# Patient Record
Sex: Male | Born: 1940
Health system: Southern US, Community
[De-identification: ages and names within clinical notes are randomized; demographics above are authoritative.]

## PROBLEM LIST (undated history)

## (undated) DIAGNOSIS — E7800 Pure hypercholesterolemia, unspecified: Secondary | ICD-10-CM

## (undated) DIAGNOSIS — I10 Essential (primary) hypertension: Secondary | ICD-10-CM

## (undated) DIAGNOSIS — K219 Gastro-esophageal reflux disease without esophagitis: Secondary | ICD-10-CM

## (undated) DIAGNOSIS — J449 Chronic obstructive pulmonary disease, unspecified: Secondary | ICD-10-CM

## (undated) DIAGNOSIS — C3490 Malignant neoplasm of unspecified part of unspecified bronchus or lung: Secondary | ICD-10-CM

## (undated) DIAGNOSIS — M1990 Unspecified osteoarthritis, unspecified site: Secondary | ICD-10-CM

## (undated) DIAGNOSIS — K5520 Angiodysplasia of colon without hemorrhage: Secondary | ICD-10-CM

## (undated) DIAGNOSIS — E785 Hyperlipidemia, unspecified: Secondary | ICD-10-CM

## (undated) DIAGNOSIS — R251 Tremor, unspecified: Secondary | ICD-10-CM

## (undated) DIAGNOSIS — H269 Unspecified cataract: Secondary | ICD-10-CM

## (undated) DIAGNOSIS — S32000A Wedge compression fracture of unspecified lumbar vertebra, initial encounter for closed fracture: Secondary | ICD-10-CM

## (undated) DIAGNOSIS — J329 Chronic sinusitis, unspecified: Secondary | ICD-10-CM

## (undated) DIAGNOSIS — K5790 Diverticulosis of intestine, part unspecified, without perforation or abscess without bleeding: Secondary | ICD-10-CM

## (undated) DIAGNOSIS — I739 Peripheral vascular disease, unspecified: Secondary | ICD-10-CM

## (undated) DIAGNOSIS — R569 Unspecified convulsions: Secondary | ICD-10-CM

## (undated) DIAGNOSIS — B392 Pulmonary histoplasmosis capsulati, unspecified: Secondary | ICD-10-CM

## (undated) DIAGNOSIS — K635 Polyp of colon: Secondary | ICD-10-CM

## (undated) DIAGNOSIS — Z720 Tobacco use: Secondary | ICD-10-CM

## (undated) DIAGNOSIS — B399 Histoplasmosis, unspecified: Secondary | ICD-10-CM

## (undated) DIAGNOSIS — D689 Coagulation defect, unspecified: Secondary | ICD-10-CM

## (undated) DIAGNOSIS — S0191XA Laceration without foreign body of unspecified part of head, initial encounter: Secondary | ICD-10-CM

## (undated) DIAGNOSIS — L723 Sebaceous cyst: Secondary | ICD-10-CM

## (undated) DIAGNOSIS — D126 Benign neoplasm of colon, unspecified: Secondary | ICD-10-CM

## (undated) DIAGNOSIS — F32A Depression, unspecified: Secondary | ICD-10-CM

## (undated) DIAGNOSIS — Z86010 Personal history of colonic polyps: Secondary | ICD-10-CM

## (undated) DIAGNOSIS — F329 Major depressive disorder, single episode, unspecified: Secondary | ICD-10-CM

## (undated) DIAGNOSIS — J189 Pneumonia, unspecified organism: Secondary | ICD-10-CM

## (undated) DIAGNOSIS — I82409 Acute embolism and thrombosis of unspecified deep veins of unspecified lower extremity: Secondary | ICD-10-CM

## (undated) DIAGNOSIS — D649 Anemia, unspecified: Secondary | ICD-10-CM

## (undated) DIAGNOSIS — R911 Solitary pulmonary nodule: Secondary | ICD-10-CM

## (undated) HISTORY — DX: Anemia, unspecified: D64.9

## (undated) HISTORY — DX: Personal history of colonic polyps: Z86.010

## (undated) HISTORY — PX: BACK SURGERY: SHX140

## (undated) HISTORY — DX: Chronic sinusitis, unspecified: J32.9

## (undated) HISTORY — PX: UPPER GASTROINTESTINAL ENDOSCOPY: SHX188

## (undated) HISTORY — DX: Wedge compression fracture of unspecified lumbar vertebra, initial encounter for closed fracture: S32.000A

## (undated) HISTORY — DX: Benign neoplasm of colon, unspecified: D12.6

## (undated) HISTORY — DX: Chronic obstructive pulmonary disease, unspecified: J44.9

## (undated) HISTORY — DX: Coagulation defect, unspecified: D68.9

## (undated) HISTORY — PX: LUNG BIOPSY: SHX232

## (undated) HISTORY — DX: Unspecified cataract: H26.9

## (undated) HISTORY — DX: Diverticulosis of intestine, part unspecified, without perforation or abscess without bleeding: K57.90

## (undated) HISTORY — DX: Hyperlipidemia, unspecified: E78.5

## (undated) HISTORY — DX: Sebaceous cyst: L72.3

## (undated) HISTORY — DX: Essential (primary) hypertension: I10

## (undated) HISTORY — DX: Malignant neoplasm of unspecified part of unspecified bronchus or lung: C34.90

## (undated) HISTORY — DX: Tobacco use: Z72.0

## (undated) HISTORY — PX: COLONOSCOPY: SHX174

## (undated) HISTORY — DX: Histoplasmosis, unspecified: B39.9

## (undated) HISTORY — DX: Unspecified convulsions: R56.9

## (undated) HISTORY — DX: Laceration without foreign body of unspecified part of head, initial encounter: S01.91XA

## (undated) HISTORY — DX: Polyp of colon: K63.5

## (undated) HISTORY — DX: Solitary pulmonary nodule: R91.1

## (undated) HISTORY — DX: Gastro-esophageal reflux disease without esophagitis: K21.9

## (undated) HISTORY — PX: EYE SURGERY: SHX253

## (undated) HISTORY — DX: Pulmonary histoplasmosis capsulati, unspecified: B39.2

## (undated) HISTORY — PX: CORONARY ANGIOPLASTY WITH STENT PLACEMENT: SHX49

## (undated) HISTORY — DX: Angiodysplasia of colon without hemorrhage: K55.20

## (undated) HISTORY — PX: TONSILLECTOMY: SUR1361

## (undated) HISTORY — DX: Peripheral vascular disease, unspecified: I73.9

## (undated) HISTORY — PX: OTHER SURGICAL HISTORY: SHX169

## (undated) HISTORY — PX: THUMB AMPUTATION: SHX804

---
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
## 1997-10-08 ENCOUNTER — Other Ambulatory Visit: Admission: RE | Admit: 1997-10-08 | Discharge: 1997-10-08 | Payer: Self-pay | Admitting: *Deleted

## 1997-10-08 ENCOUNTER — Ambulatory Visit (HOSPITAL_COMMUNITY): Admission: RE | Admit: 1997-10-08 | Discharge: 1997-10-08 | Payer: Self-pay | Admitting: *Deleted

## 1998-12-25 ENCOUNTER — Observation Stay (HOSPITAL_COMMUNITY): Admission: RE | Admit: 1998-12-25 | Discharge: 1998-12-26 | Payer: Self-pay

## 1999-02-19 ENCOUNTER — Ambulatory Visit (HOSPITAL_COMMUNITY): Admission: RE | Admit: 1999-02-19 | Discharge: 1999-02-19 | Payer: Self-pay | Admitting: *Deleted

## 1999-02-26 ENCOUNTER — Ambulatory Visit (HOSPITAL_COMMUNITY): Admission: RE | Admit: 1999-02-26 | Discharge: 1999-02-26 | Payer: Self-pay | Admitting: *Deleted

## 2000-03-16 HISTORY — PX: KNEE SURGERY: SHX244

## 2000-04-19 ENCOUNTER — Ambulatory Visit (HOSPITAL_BASED_OUTPATIENT_CLINIC_OR_DEPARTMENT_OTHER): Admission: RE | Admit: 2000-04-19 | Discharge: 2000-04-19 | Payer: Self-pay | Admitting: *Deleted

## 2000-04-19 ENCOUNTER — Encounter (INDEPENDENT_AMBULATORY_CARE_PROVIDER_SITE_OTHER): Payer: Self-pay | Admitting: Specialist

## 2000-05-20 ENCOUNTER — Ambulatory Visit (HOSPITAL_BASED_OUTPATIENT_CLINIC_OR_DEPARTMENT_OTHER): Admission: RE | Admit: 2000-05-20 | Discharge: 2000-05-20 | Payer: Self-pay | Admitting: *Deleted

## 2000-05-20 ENCOUNTER — Encounter (INDEPENDENT_AMBULATORY_CARE_PROVIDER_SITE_OTHER): Payer: Self-pay | Admitting: Specialist

## 2006-03-16 DIAGNOSIS — Z860100 Personal history of colon polyps, unspecified: Secondary | ICD-10-CM

## 2006-03-16 HISTORY — DX: Personal history of colonic polyps: Z86.010

## 2006-03-16 HISTORY — DX: Personal history of colon polyps, unspecified: Z86.0100

## 2006-04-15 ENCOUNTER — Ambulatory Visit: Payer: Self-pay | Admitting: Internal Medicine

## 2006-04-15 LAB — CONVERTED CEMR LAB
AST: 20 units/L (ref 0–37)
Albumin: 4.5 g/dL (ref 3.5–5.2)
Alkaline Phosphatase: 64 units/L (ref 39–117)
Basophils Absolute: 0 10*3/uL (ref 0.0–0.1)
Basophils Relative: 0.5 % (ref 0.0–1.0)
Calcium: 9.3 mg/dL (ref 8.4–10.5)
Cholesterol: 198 mg/dL (ref 0–200)
Creatinine, Ser: 1 mg/dL (ref 0.4–1.5)
Eosinophils Absolute: 0.1 10*3/uL (ref 0.0–0.6)
Eosinophils Relative: 1.7 % (ref 0.0–5.0)
Glucose, Bld: 102 mg/dL — ABNORMAL HIGH (ref 70–99)
HCT: 48.5 % (ref 39.0–52.0)
HDL: 32.3 mg/dL — ABNORMAL LOW (ref >39.0)
Hemoglobin: 16.8 g/dL (ref 13.0–17.0)
Lymphocytes Relative: 25.8 % (ref 12.0–46.0)
Monocytes Relative: 10.1 % (ref 3.0–11.0)
Potassium: 4.1 meq/L (ref 3.5–5.1)
RDW: 12.1 % (ref 11.5–14.6)
TSH: 2.17 microintl units/mL (ref 0.35–5.50)
Testosterone Free: 121.7 pg/mL (ref 47.0–244.0)

## 2006-04-27 ENCOUNTER — Ambulatory Visit: Payer: Self-pay | Admitting: Internal Medicine

## 2006-04-30 ENCOUNTER — Ambulatory Visit: Payer: Self-pay | Admitting: Internal Medicine

## 2006-05-11 ENCOUNTER — Ambulatory Visit: Payer: Self-pay | Admitting: Internal Medicine

## 2006-05-11 ENCOUNTER — Encounter (INDEPENDENT_AMBULATORY_CARE_PROVIDER_SITE_OTHER): Payer: Self-pay | Admitting: Specialist

## 2006-05-21 ENCOUNTER — Ambulatory Visit: Payer: Self-pay | Admitting: Internal Medicine

## 2006-06-14 ENCOUNTER — Ambulatory Visit: Payer: Self-pay | Admitting: Internal Medicine

## 2006-06-14 LAB — CONVERTED CEMR LAB
ALT: 33 units/L (ref 0–40)
Alkaline Phosphatase: 56 units/L (ref 39–117)
Bilirubin, Direct: 0.1 mg/dL (ref 0.0–0.3)
Cholesterol: 175 mg/dL (ref 0–200)
Direct LDL: 118.8 mg/dL
Total Protein: 6.9 g/dL (ref 6.0–8.3)

## 2006-06-15 DIAGNOSIS — R911 Solitary pulmonary nodule: Secondary | ICD-10-CM

## 2006-06-15 HISTORY — DX: Solitary pulmonary nodule: R91.1

## 2006-06-22 ENCOUNTER — Ambulatory Visit: Payer: Self-pay | Admitting: Cardiology

## 2006-07-12 ENCOUNTER — Ambulatory Visit: Payer: Self-pay | Admitting: Internal Medicine

## 2006-07-19 ENCOUNTER — Ambulatory Visit (HOSPITAL_COMMUNITY): Admission: RE | Admit: 2006-07-19 | Discharge: 2006-07-19 | Payer: Self-pay | Admitting: Internal Medicine

## 2006-07-20 ENCOUNTER — Ambulatory Visit: Payer: Self-pay | Admitting: Internal Medicine

## 2006-07-27 ENCOUNTER — Encounter (INDEPENDENT_AMBULATORY_CARE_PROVIDER_SITE_OTHER): Payer: Self-pay | Admitting: Specialist

## 2006-07-27 ENCOUNTER — Ambulatory Visit (HOSPITAL_COMMUNITY): Admission: RE | Admit: 2006-07-27 | Discharge: 2006-07-27 | Payer: Self-pay | Admitting: Gastroenterology

## 2006-08-12 ENCOUNTER — Ambulatory Visit: Payer: Self-pay | Admitting: Internal Medicine

## 2006-08-17 ENCOUNTER — Ambulatory Visit: Payer: Self-pay | Admitting: Internal Medicine

## 2007-02-23 ENCOUNTER — Encounter: Payer: Self-pay | Admitting: Internal Medicine

## 2007-03-25 ENCOUNTER — Encounter: Payer: Self-pay | Admitting: Internal Medicine

## 2007-04-26 ENCOUNTER — Ambulatory Visit: Payer: Self-pay | Admitting: Internal Medicine

## 2007-04-26 LAB — CONVERTED CEMR LAB
Basophils Absolute: 0 10*3/uL (ref 0.0–0.1)
Eosinophils Absolute: 0.1 10*3/uL (ref 0.0–0.6)
Lymphocytes Relative: 29.1 % (ref 12.0–46.0)
MCHC: 34.6 g/dL (ref 30.0–36.0)
Neutrophils Relative %: 59.8 % (ref 43.0–77.0)
Platelets: 248 10*3/uL (ref 150–400)
WBC: 7.9 10*3/uL (ref 4.5–10.5)

## 2007-05-05 ENCOUNTER — Ambulatory Visit (HOSPITAL_COMMUNITY): Admission: RE | Admit: 2007-05-05 | Discharge: 2007-05-05 | Payer: Self-pay | Admitting: Internal Medicine

## 2007-05-05 ENCOUNTER — Encounter: Payer: Self-pay | Admitting: Internal Medicine

## 2007-05-05 HISTORY — PX: ESOPHAGOGASTRODUODENOSCOPY: SHX1529

## 2007-05-05 LAB — HM COLONOSCOPY: HM Colonoscopy: NORMAL

## 2007-05-11 ENCOUNTER — Ambulatory Visit: Payer: Self-pay | Admitting: Internal Medicine

## 2007-08-19 ENCOUNTER — Ambulatory Visit: Payer: Self-pay | Admitting: Internal Medicine

## 2007-08-19 DIAGNOSIS — R918 Other nonspecific abnormal finding of lung field: Secondary | ICD-10-CM | POA: Insufficient documentation

## 2007-08-19 DIAGNOSIS — F17200 Nicotine dependence, unspecified, uncomplicated: Secondary | ICD-10-CM | POA: Insufficient documentation

## 2007-08-19 DIAGNOSIS — I10 Essential (primary) hypertension: Secondary | ICD-10-CM | POA: Insufficient documentation

## 2007-08-20 ENCOUNTER — Telehealth: Payer: Self-pay | Admitting: Internal Medicine

## 2007-09-26 ENCOUNTER — Ambulatory Visit: Payer: Self-pay | Admitting: Internal Medicine

## 2007-09-26 LAB — CONVERTED CEMR LAB
HDL: 27.2 mg/dL — ABNORMAL LOW (ref >39.0)
LDL Cholesterol: 92 mg/dL (ref 0–99)
PSA: 0.39 ng/mL (ref 0.10–4.00)
Total CHOL/HDL Ratio: 5.6

## 2007-09-30 ENCOUNTER — Ambulatory Visit: Payer: Self-pay | Admitting: Internal Medicine

## 2007-09-30 ENCOUNTER — Ambulatory Visit (HOSPITAL_BASED_OUTPATIENT_CLINIC_OR_DEPARTMENT_OTHER): Admission: RE | Admit: 2007-09-30 | Discharge: 2007-09-30 | Payer: Self-pay | Admitting: Internal Medicine

## 2007-09-30 DIAGNOSIS — M545 Low back pain, unspecified: Secondary | ICD-10-CM | POA: Insufficient documentation

## 2007-09-30 DIAGNOSIS — M25559 Pain in unspecified hip: Secondary | ICD-10-CM | POA: Insufficient documentation

## 2007-10-03 ENCOUNTER — Telehealth: Payer: Self-pay | Admitting: Internal Medicine

## 2007-10-18 ENCOUNTER — Encounter: Admission: RE | Admit: 2007-10-18 | Discharge: 2007-12-07 | Payer: Self-pay | Admitting: Internal Medicine

## 2007-11-10 ENCOUNTER — Encounter: Payer: Self-pay | Admitting: Internal Medicine

## 2007-12-01 ENCOUNTER — Telehealth: Payer: Self-pay | Admitting: Internal Medicine

## 2007-12-01 ENCOUNTER — Ambulatory Visit (HOSPITAL_BASED_OUTPATIENT_CLINIC_OR_DEPARTMENT_OTHER): Admission: RE | Admit: 2007-12-01 | Discharge: 2007-12-01 | Payer: Self-pay | Admitting: Internal Medicine

## 2007-12-01 ENCOUNTER — Ambulatory Visit: Payer: Self-pay | Admitting: Internal Medicine

## 2007-12-21 ENCOUNTER — Encounter: Payer: Self-pay | Admitting: Internal Medicine

## 2008-02-16 ENCOUNTER — Encounter: Payer: Self-pay | Admitting: Internal Medicine

## 2008-02-29 ENCOUNTER — Ambulatory Visit: Payer: Self-pay | Admitting: Internal Medicine

## 2008-02-29 LAB — CONVERTED CEMR LAB
BUN: 12 mg/dL (ref 6–23)
Calcium: 9.1 mg/dL (ref 8.4–10.5)
Chloride: 106 meq/L (ref 96–112)
Creatinine, Ser: 0.9 mg/dL (ref 0.4–1.5)
GFR calc Af Amer: 109 mL/min

## 2008-03-02 ENCOUNTER — Ambulatory Visit: Payer: Self-pay | Admitting: Internal Medicine

## 2008-03-02 DIAGNOSIS — E785 Hyperlipidemia, unspecified: Secondary | ICD-10-CM

## 2008-03-02 DIAGNOSIS — E782 Mixed hyperlipidemia: Secondary | ICD-10-CM | POA: Insufficient documentation

## 2008-03-02 DIAGNOSIS — L821 Other seborrheic keratosis: Secondary | ICD-10-CM | POA: Insufficient documentation

## 2008-04-10 ENCOUNTER — Telehealth: Payer: Self-pay | Admitting: Internal Medicine

## 2008-05-02 ENCOUNTER — Telehealth: Payer: Self-pay | Admitting: Internal Medicine

## 2008-05-15 ENCOUNTER — Ambulatory Visit: Payer: Self-pay | Admitting: Internal Medicine

## 2008-05-15 ENCOUNTER — Telehealth: Payer: Self-pay | Admitting: Internal Medicine

## 2008-05-15 LAB — CONVERTED CEMR LAB
Chloride: 104 meq/L (ref 96–112)
Potassium: 4.3 meq/L (ref 3.5–5.1)
Sodium: 140 meq/L (ref 135–145)

## 2008-06-05 ENCOUNTER — Telehealth: Payer: Self-pay | Admitting: Internal Medicine

## 2008-06-06 ENCOUNTER — Ambulatory Visit: Payer: Self-pay | Admitting: Diagnostic Radiology

## 2008-06-06 ENCOUNTER — Ambulatory Visit (HOSPITAL_BASED_OUTPATIENT_CLINIC_OR_DEPARTMENT_OTHER): Admission: RE | Admit: 2008-06-06 | Discharge: 2008-06-06 | Payer: Self-pay | Admitting: Internal Medicine

## 2008-06-11 ENCOUNTER — Ambulatory Visit: Payer: Self-pay | Admitting: Internal Medicine

## 2008-06-11 DIAGNOSIS — I739 Peripheral vascular disease, unspecified: Secondary | ICD-10-CM | POA: Insufficient documentation

## 2008-06-13 ENCOUNTER — Ambulatory Visit: Payer: Self-pay

## 2008-06-13 ENCOUNTER — Encounter: Payer: Self-pay | Admitting: Internal Medicine

## 2008-07-20 ENCOUNTER — Telehealth: Payer: Self-pay | Admitting: Internal Medicine

## 2008-08-03 ENCOUNTER — Ambulatory Visit: Payer: Self-pay | Admitting: Internal Medicine

## 2008-08-03 LAB — CONVERTED CEMR LAB
ALT: 31 units/L (ref 0–53)
AST: 21 units/L (ref 0–37)
BUN: 19 mg/dL (ref 6–23)
CO2: 27 meq/L (ref 19–32)
Calcium: 9.3 mg/dL (ref 8.4–10.5)
GFR calc non Af Amer: 89.35 mL/min (ref >60)
Glucose, Bld: 106 mg/dL — ABNORMAL HIGH (ref 70–99)
LDL Cholesterol: 107 mg/dL — ABNORMAL HIGH (ref 0–99)

## 2008-08-06 ENCOUNTER — Ambulatory Visit: Payer: Self-pay | Admitting: Internal Medicine

## 2008-08-07 ENCOUNTER — Ambulatory Visit: Payer: Self-pay | Admitting: Diagnostic Radiology

## 2008-08-07 ENCOUNTER — Ambulatory Visit (HOSPITAL_BASED_OUTPATIENT_CLINIC_OR_DEPARTMENT_OTHER): Admission: RE | Admit: 2008-08-07 | Discharge: 2008-08-07 | Payer: Self-pay | Admitting: Internal Medicine

## 2008-08-07 ENCOUNTER — Telehealth: Payer: Self-pay | Admitting: Internal Medicine

## 2008-08-14 HISTORY — PX: ANGIOPLASTY / STENTING ILIAC: SUR31

## 2008-08-28 ENCOUNTER — Ambulatory Visit: Payer: Self-pay | Admitting: Vascular Surgery

## 2008-09-11 ENCOUNTER — Ambulatory Visit (HOSPITAL_COMMUNITY): Admission: RE | Admit: 2008-09-11 | Discharge: 2008-09-11 | Payer: Self-pay | Admitting: Vascular Surgery

## 2008-09-11 ENCOUNTER — Ambulatory Visit: Payer: Self-pay | Admitting: Surgery

## 2008-10-09 ENCOUNTER — Ambulatory Visit: Payer: Self-pay | Admitting: Vascular Surgery

## 2008-10-09 ENCOUNTER — Encounter: Payer: Self-pay | Admitting: Internal Medicine

## 2008-10-11 ENCOUNTER — Ambulatory Visit: Payer: Self-pay | Admitting: Internal Medicine

## 2008-10-17 ENCOUNTER — Ambulatory Visit: Payer: Self-pay

## 2008-10-31 ENCOUNTER — Telehealth: Payer: Self-pay | Admitting: Internal Medicine

## 2008-11-16 ENCOUNTER — Telehealth: Payer: Self-pay | Admitting: Internal Medicine

## 2009-03-14 ENCOUNTER — Ambulatory Visit: Payer: Self-pay | Admitting: Surgery

## 2009-03-27 ENCOUNTER — Encounter (INDEPENDENT_AMBULATORY_CARE_PROVIDER_SITE_OTHER): Payer: Self-pay | Admitting: *Deleted

## 2009-03-29 ENCOUNTER — Ambulatory Visit: Payer: Self-pay | Admitting: Internal Medicine

## 2009-03-29 LAB — CONVERTED CEMR LAB
ALT: 27 units/L (ref 0–53)
Bilirubin, Direct: <0.1 mg/dL (ref 0.0–0.3)
CO2: 18 meq/L — ABNORMAL LOW (ref 19–32)
Calcium: 9.3 mg/dL (ref 8.4–10.5)
Chloride: 105 meq/L (ref 96–112)
Creatinine, Ser: 0.91 mg/dL (ref 0.40–1.50)
HDL: 33 mg/dL — ABNORMAL LOW (ref >39)
LDL Cholesterol: 52 mg/dL (ref 0–99)
Total CHOL/HDL Ratio: 3.8
Total Protein: 7.2 g/dL (ref 6.0–8.3)

## 2009-04-02 ENCOUNTER — Telehealth: Payer: Self-pay | Admitting: Internal Medicine

## 2009-04-02 ENCOUNTER — Ambulatory Visit: Payer: Self-pay | Admitting: Diagnostic Radiology

## 2009-04-02 ENCOUNTER — Ambulatory Visit: Payer: Self-pay | Admitting: Internal Medicine

## 2009-04-02 ENCOUNTER — Ambulatory Visit (HOSPITAL_BASED_OUTPATIENT_CLINIC_OR_DEPARTMENT_OTHER): Admission: RE | Admit: 2009-04-02 | Discharge: 2009-04-02 | Payer: Self-pay | Admitting: Internal Medicine

## 2009-04-02 DIAGNOSIS — M25569 Pain in unspecified knee: Secondary | ICD-10-CM | POA: Insufficient documentation

## 2009-04-02 DIAGNOSIS — L723 Sebaceous cyst: Secondary | ICD-10-CM | POA: Insufficient documentation

## 2009-06-11 ENCOUNTER — Ambulatory Visit: Payer: Self-pay | Admitting: Diagnostic Radiology

## 2009-06-11 ENCOUNTER — Telehealth: Payer: Self-pay | Admitting: Internal Medicine

## 2009-06-11 ENCOUNTER — Ambulatory Visit (HOSPITAL_BASED_OUTPATIENT_CLINIC_OR_DEPARTMENT_OTHER): Admission: RE | Admit: 2009-06-11 | Discharge: 2009-06-11 | Payer: Self-pay | Admitting: Internal Medicine

## 2009-08-20 ENCOUNTER — Ambulatory Visit: Payer: Self-pay | Admitting: Internal Medicine

## 2009-09-10 ENCOUNTER — Ambulatory Visit: Payer: Self-pay | Admitting: Surgery

## 2009-09-10 ENCOUNTER — Encounter: Payer: Self-pay | Admitting: Internal Medicine

## 2009-11-25 ENCOUNTER — Telehealth: Payer: Self-pay | Admitting: Internal Medicine

## 2010-02-20 ENCOUNTER — Ambulatory Visit: Payer: Self-pay | Admitting: Internal Medicine

## 2010-02-21 ENCOUNTER — Ambulatory Visit: Payer: Self-pay | Admitting: Internal Medicine

## 2010-02-21 ENCOUNTER — Encounter: Payer: Self-pay | Admitting: Internal Medicine

## 2010-02-25 ENCOUNTER — Encounter: Payer: Self-pay | Admitting: Internal Medicine

## 2010-02-25 LAB — CONVERTED CEMR LAB
AST: 18 units/L (ref 0–37)
Alkaline Phosphatase: 72 units/L (ref 39–117)
Bilirubin, Direct: 0.1 mg/dL (ref 0.0–0.3)
Creatinine, Ser: 1.12 mg/dL (ref 0.40–1.50)
Glucose, Bld: 82 mg/dL (ref 70–99)
HDL: 31 mg/dL — ABNORMAL LOW (ref >39)
Indirect Bilirubin: 0.3 mg/dL (ref 0.0–0.9)
Sodium: 139 meq/L (ref 135–145)
TSH: 3.39 microintl units/mL (ref 0.350–4.500)
Total CK: 227 units/L (ref 7–232)

## 2010-04-15 ENCOUNTER — Ambulatory Visit
Admission: RE | Admit: 2010-04-15 | Discharge: 2010-04-15 | Payer: Self-pay | Source: Home / Self Care | Attending: Vascular Surgery | Admitting: Vascular Surgery

## 2010-04-15 ENCOUNTER — Ambulatory Visit: Admit: 2010-04-15 | Payer: Self-pay | Admitting: Vascular Surgery

## 2010-04-15 NOTE — Letter (Signed)
Summary: Colonoscopy Letter  [facility]  [address] [place], [place] [address]   Phone: [phone]  Fax: [phone]      March 27, 2009 MRN: [id number]   JASON FRISBEE [address] [place], [place]  [address]   Dear Mr. BUTH,   According to your medical record, it is time for you to schedule a Colonoscopy. The American Cancer Society recommends this procedure as a method to detect early colon cancer. Patients with a family history of colon cancer, or a personal history of colon polyps or inflammatory bowel disease are at increased risk.  This letter has beeen generated based on the recommendations made at the time of your procedure. If you feel that in your particular situation this may no longer apply, please contact our office.  Please call our office at [phone] to schedule this appointment or to update your records at your earliest convenience.  Thank you for cooperating with [place] to provide you with the very best care possible.   Sincerely,  Iva Boop, M.D.  [facility] [phone]

## 2010-04-15 NOTE — Progress Notes (Signed)
  Phone Note Outgoing Call   Summary of Call: call pt - cxr shows no change in pulm nodule.  likely benign Initial call taken by: D. Thomos Lemons DO,  June 11, 2009 5:26 PM  Follow-up for Phone Call        Spoke with pt  result of chest X Ray  Follow-up by: Darral Dash,  June 12, 2009 2:20 PM

## 2010-04-15 NOTE — Assessment & Plan Note (Signed)
Summary: 6 MONTH FOLLOW UP/MHF   Vital Signs:  Patient profile:   70 year old male Height:      68 inches Weight:      188.50 pounds BMI:     28.76 O2 Sat:      96 % on Room air Temp:     98.2 degrees F oral Pulse rate:   84 / minute Pulse rhythm:   regular Resp:     18 per minute BP sitting:   128 / 70  (right arm) Cuff size:   large  Vitals Entered By: Glendell Docker CMA (August 20, 2009 8:24 AM)  O2 Flow:  Room air CC: Rm 2- 6 Month Follow up Is Patient Diabetic? No Comments evaluation of mole changes on each arm   Primary Care Provider:  DThomos Lemons DO  CC:  Rm 2- 6 Month Follow up.  History of Present Illness:  Hypertension Follow-Up      This is a 70 year old man who presents for Hypertension follow-up.  The patient denies lightheadedness and edema.  The patient denies the following associated symptoms: chest pain.  Compliance with medications (by patient report) has been near 100%.  The patient reports that dietary compliance has been fair.    walks his dog - scottish terrier.  occ visits local nursing home friend - Ed McDonald health deteriorating  still smoking - 15 cig per day  Knee arthritis - improved with steroid injection  few hyperpigmented lesion on arms.  dark, warty appearance  Preventive Screening-Counseling & Management  Alcohol-Tobacco     Smoking Status: current  Allergies (verified): No Known Drug Allergies  Past History:  Past Medical History: Hypertension Tobacco Abuse  9 mm right upper lobe pulmonary nodule 06/2006 (negative PET scan) Colon polyps 2009 - Repeat colonoscopy due 2011 (Dr. Leone Payor)     Hyperlipidemia   PVD  Past Surgical History: Back surgery x 2 -  lumbar fusion  (over 30 yrs ago)      Family History: Mother is deceased at age 27.   Father died at age 55 of suicide.           Social History: The patient is happily married.  He lives with his wife.   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    Occasional  alcohol.    Tobacco use - over 50 pack years.    Smoking Status:  current l  Review of Systems       resting tremor - head  Physical Exam  General:  alert.   Neck:  No deformities, masses, or tenderness noted. Lungs:  normal respiratory effort, normal breath sounds, no crackles, and no wheezes.   Heart:  normal rate, regular rhythm, and no gallop.   Pulses:  diminished PD and PT pulses of right foot Extremities:  trace left pedal edema and trace right pedal edema.   Skin:  seborrheic keratosis    Impression & Recommendations:  Problem # 1:  HYPERTENSION (ICD-401.9) well controlled.  Maintain current medication regimen.  His updated medication list for this problem includes:    Amlodipine Besylate 5 Mg Tabs (Amlodipine besylate) ..... One by mouth once daily    Avalide 150-12.5 Mg Tabs (Irbesartan-hydrochlorothiazide) ..... One by mouth qd  BP today: 128/70 Prior BP: 110/72 (04/02/2009)  Labs Reviewed: K+: 4.7 (03/29/2009) Creat: : 0.91 (03/29/2009)   Chol: 124 (03/29/2009)   HDL: 33 (03/29/2009)   LDL: 52 (03/29/2009)   TG: 196 (03/29/2009)  Problem # 2:  SEBORRHEIC KERATOSIS (ICD-702.19) observe for now  Complete Medication List: 1)  Omeprazole 20 Mg Cpdr (Omeprazole) .... Take 1 capsule by mouth twice a day 2)  Crestor 40 Mg Tabs (Rosuvastatin calcium) .... One by mouth qd 3)  Amlodipine Besylate 5 Mg Tabs (Amlodipine besylate) .... One by mouth once daily 4)  Avalide 150-12.5 Mg Tabs (Irbesartan-hydrochlorothiazide) .... One by mouth qd 5)  Aspirin Ec 325 Mg Tbec (Aspirin) .... One by mouth qd  Patient Instructions: 1)  Please schedule a follow-up appointment in 6 months. 2)  BMP prior to visit, ICD-9:  401.9 3)  Lipid Panel, AST, ALT prior to visit, ICD-9:  272.4 4)  TSH:  272.4 5)  Please return for lab work one (1) week before your next appointment.   Current Allergies (reviewed today): No known allergies

## 2010-04-15 NOTE — Progress Notes (Signed)
Summary: Schedule Colonoscopy   Phone Note Outgoing Call Call back at Home Phone [phone]   Call placed by: Harlow Mares CMA [facility]),  November 25, 2009 11:41 AM Call placed to: Patient Summary of Call: spoke to the patients wife she states that her husband is not interested in scheduling his colonoscopy at this time but I advised her to have him call back and let us know either way. I asked if she would like to take down our number and she states that "Its in the phonebook".  Initial call taken by: Harlow Mares CMA [facility]),  November 25, 2009 11:42 AM

## 2010-04-15 NOTE — Assessment & Plan Note (Signed)
Summary: F/U BLOOD PRESSURE/DK   Vital Signs:  Patient profile:   70 year old male Weight:      188.25 pounds BMI:     28.73 O2 Sat:      97 % on Room air Temp:     98.1 degrees F oral Pulse rate:   86 / minute Pulse rhythm:   regular Resp:     18 per minute BP sitting:   110 / 72  (right arm) Cuff size:   regular  Vitals Entered By: Glendell Docker CMA (April 02, 2009 9:01 AM)  O2 Flow:  Room air  Contraindications/Deferment of Procedures/Staging:    Test/Procedure: FLU VAX    Reason for deferment: patient declined   Primary Care Provider:  Dondra Spry DO  CC:  Followup disease management and Lower Extremity Joint pain.  History of Present Illness: follow up disease management  Lower Extremity Joint Pain      This is a 70 year old man who presents with Lower Extremity Joint pain.  The patient denies swelling and redness.  The pain is located in the right knee.  The pain began gradually and with no injury.  The pain is described as sharp and intermittent.  Evaluation to date has included no evaluation.  pain worse with full extension of right leg.  also medial joint pain.  Htn - stable.  good med compliance  PVD - left groin "acts up" at times.  seen by vascular surgeon two weeks ago.  good circulation.  no aneurysm  hyperlipidemia - taking crestor.  still unable to quit smoking.  gets down to 3-4 cig but increase when feeling stressed.  also noticed enlarged cyst below right ear.  lab results reviewed with pt  Allergies (verified): No Known Drug Allergies  Past History:  Past Medical History: Hypertension Tobacco Abuse  9 mm right upper lobe pulmonary nodule 06/2006 (negative PET scan) Colon polyps 2009 - Repeat colonoscopy due 2011 (Dr. Leone Payor)     Hyperlipidemia  PVD  Social History: The patient is married.  He lives with his wife.  He is happily married.   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    Occasional alcohol.   Tobacco use - over 50 pack  years.      Review of Systems       occ left groin / inguinal pain (prev site of stent)  Physical Exam  General:  alert, well-developed, and well-nourished.   Head:  normocephalic and atraumatic.   Ears:  1 cm sebaceous cyst behind right ear Lungs:  normal respiratory effort and normal breath sounds.   Heart:  normal rate, regular rhythm, and no gallop.   Msk:  right knee - no redness over joints and no joint instability.  pain with full extension behind patellar.  pain at medial joint line Extremities:  No lower extremity edema    Impression & Recommendations:  Problem # 1:  KNEE PAIN, RIGHT (ICD-719.46) 70 y/o with right knee pain.  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   check x ray.  arrange f/u with [facility] His updated medication list for this problem includes:    Aspirin Ec 325 Mg Tbec (Aspirin) ..... One by mouth qd  Orders: T-Knee Right 2 view (73560TC) Orthopedic Referral (Ortho)  Problem # 2:  HYPERTENSION (ICD-401.9) well controlled. Maintain current medication regimen.  His updated medication list for this problem includes:    Amlodipine Besylate 5 Mg Tabs (Amlodipine besylate) ..... One  by mouth once daily    Avalide 150-12.5 Mg Tabs (Irbesartan-hydrochlorothiazide) ..... One by mouth qd  BP today: 110/72 Prior BP: 110/70 (10/11/2008)  Labs Reviewed: K+: 4.7 (03/29/2009) Creat: : 0.91 (03/29/2009)   Chol: 124 (03/29/2009)   HDL: 33 (03/29/2009)   LDL: 52 (03/29/2009)   TG: 196 (03/29/2009)  Problem # 3:  PULMONARY NODULE (ICD-518.89) arrange f/u CXR in March.  Future Orders: Radiology Referral (Radiology) ... 05/31/2009  Problem # 4:  SEBACEOUS CYST, NECK (ICD-706.2)  I & D of sebaceous cyst behind right ear.  .5 cc of lidocaine with epi used.  no complication.  no bleeding.  aftercare discussed.  Orders: I&D Abscess, Simple / Single (10060)  Complete Medication List: 1)  Omeprazole 20 Mg Cpdr (Omeprazole) .... Take 1 capsule by mouth twice a  day 2)  Crestor 40 Mg Tabs (Rosuvastatin calcium) .... One by mouth qd 3)  Amlodipine Besylate 5 Mg Tabs (Amlodipine besylate) .... One by mouth once daily 4)  Avalide 150-12.5 Mg Tabs (Irbesartan-hydrochlorothiazide) .... One by mouth qd 5)  Aspirin Ec 325 Mg Tbec (Aspirin) .... One by mouth qd  Patient Instructions: 1)  Please schedule a follow-up appointment in 6 months. Prescriptions: AMLODIPINE BESYLATE 5 MG TABS (AMLODIPINE BESYLATE) one by mouth once daily  #90 Tablet x 3   Entered and Authorized by:   D. Thomos Lemons DO   Signed by:   D. Thomos Lemons DO on 04/02/2009   Method used:   Electronically to        [facility] [address] (retail)       [address]       [place], [place]  [address]       Ph: [phone] or [phone]       Fax: [phone]   RxID:   [id number] AVALIDE 150-12.5 MG TABS (IRBESARTAN-HYDROCHLOROTHIAZIDE) one by mouth qd  #90 x 3   Entered and Authorized by:   D. Thomos Lemons DO   Signed by:   D. Thomos Lemons DO on 04/02/2009   Method used:   Electronically to        [facility] [address] [address] (retail)       [address]       [place], [place]  [address]       Ph: [phone] or [phone]       Fax: [phone]   RxID:   [id number] CRESTOR 40 MG TABS (ROSUVASTATIN CALCIUM) one by mouth qd  #90 x 3   Entered and Authorized by:   D. Thomos Lemons DO   Signed by:   D. Thomos Lemons DO on 04/02/2009   Method used:   Electronically to        [facility] [address] (retail)       [address]       [place], [place]  [address]       Ph: [phone] or [phone]       Fax: [phone]   RxID:   [id number]   Current Allergies (reviewed today): No known allergies    Immunization History:  Influenza Immunization History:    Influenza:  declined (04/02/2009)   Appended Document: Orders Update    Clinical Lists  Changes  Orders: Added new Service order of State-TD Vaccine 7 yrs. & > IM (81829H) -  Signed Added new Service order of Admin 1st Vaccine (75643) - Signed Observations: Added new observation of TD BOOST VIS: 02/01/07 version given April 02, 2009. (04/02/2009 9:58) Added new observation of TD BOOSTERLO: [id number] (04/02/2009 9:58) Added new observation of TD BOOST EXP: 09/29/2010 (04/02/2009 9:58) Added new observation of TD BOOSTERBY: Glendell Docker CMA (04/02/2009 9:58) Added new observation of TD BOOSTERRT: IM (04/02/2009 9:58) Added new observation of TDBOOSTERDSE: 0.5 ml (04/02/2009 9:58) Added new observation of TD BOOSTERMF: GlaxoSmithKline (04/02/2009 9:58) Added new observation of TD BOOST SIT: left deltoid (04/02/2009 9:58) Added new observation of TD BOOSTER: Tdap (State) (04/02/2009 9:58)       Immunizations Administered:  Tetanus Vaccine:    Vaccine Type: Tdap (State)    Site: left deltoid    Mfr: GlaxoSmithKline    Dose: 0.5 ml    Route: IM    Given by: Glendell Docker CMA    Exp. Date: 09/29/2010    Lot #: [id number]    VIS given: 02/01/07 version given April 02, 2009.

## 2010-04-15 NOTE — Progress Notes (Signed)
  Phone Note Outgoing Call   Summary of Call: call pt - x ray shows mild joint space narrowing.   f/u with ortho as directed Initial call taken by: D. Thomos Lemons DO,  April 02, 2009 6:12 PM  Follow-up for Phone Call        left a detailed mess. with spouse for patient to call our office to get his x-ray results. Follow-up by: Michaelle Copas,  April 03, 2009 10:31 AM

## 2010-04-16 HISTORY — PX: DESCENDING AORTIC ANEURYSM REPAIR W/ STENT: SHX1456

## 2010-04-16 NOTE — Assessment & Plan Note (Signed)
OFFICE VISIT  Timothy Wood, Timothy Wood DOB:  Apr 11, 1940                                       04/15/2010 [id number]#:[id number]  HISTORY OF PRESENT ILLNESS:  The patient returns today for further evaluation of his lower extremities.  He previously had successful left iliac PTA and stenting by Dr. Myra Gianotti in June 2010 with complete resolution of symptoms.  Over the last 6 to 9 months, he has developed similar symptoms involving both lower extremities, right equals left, with the thighs becoming hypersensitive and numb extending down into the calves limiting his walking.  He must then rest 3 to 4 minutes at which time he can resume his walking.  He has some numbness associated with this.  He has no history of gangrene or infection.  CHRONIC MEDICAL PROBLEMS: 1. Hypertension. 2. Hyperlipidemia. 3. Degenerative joint disease with previous lumbar laminectomy. 4. Negative for coronary artery disease, diabetes, COPD, or stroke.  FAMILY HISTORY:  Positive for coronary artery disease.  His brother died of an MI.  Negative for diabetes or stroke.  SOCIAL HISTORY:  Continues to smoke a half-pack of cigarettes per day. Has greater than 50 pack-year history.  Does not use alcohol.  REVIEW OF SYSTEMS:  Negative for chest pain, dyspnea on exertion, PND, orthopnea.  All systems in complete of systems are negative.  PHYSICAL EXAM:  VITAL SIGNS:  Blood pressure 148/84, heart rate 84, respirations 14, temperature 98.1.  General:  Well-developed, well- nourished male in no apparent distress, alert and x3.  HEENT:  Normal for age.  EOMs intact.  Lungs:  Clear to auscultation.  No rhonchi or wheezing.  Cardiovascular:  Regular rhythm.  No murmurs.  Carotid pulses 3+.  No audible bruits.  Abdomen:  Soft, nontender with no masses. Musculoskeletal:  Free of major deformities.  Neurologic:  Normal. Skin:  Free of rashes.  Lower extremity exam reveals 2+ femoral, popliteal, and posterior  tibial pulses bilaterally.  Today, I ordered lower extremity arterial Dopplers with a duplex scan of the aorta.  ABIs are 0.93 on the left, down from 1.1, and 1.1 on the right equal to June 2011.  He does have a velocity of 400 cc/sec in the terminal aorta.  ASSESSMENT AND PLAN:  I think he has developed a distal aortic stenosis causing symptoms of both lower extremities and will arrange for him to have a repeat angiogram by Dr. Myra Gianotti on February 14th with possible PTA and stenting of his terminal aorta.  I stressed the importance of discontinuing smoking as he will continue to have problems in the future more than likely.  Risks and benefits have been thoroughly discussed and he would like to proceed.    Quita Skye [name], M.D. Electronically Signed  JDL/MEDQ  D:  04/15/2010  T:  04/16/2010  Job:  [id number]

## 2010-04-17 NOTE — Assessment & Plan Note (Signed)
Summary: follow up/mhf   Vital Signs:  Patient profile:   70 year old male Height:      68 inches Weight:      191.75 pounds BMI:     29.26 O2 Sat:      99 % on Room air Temp:     97.8 degrees F oral Pulse rate:   94 / minute Resp:     20 per minute BP sitting:   122 / 70  (right arm) Cuff size:   large  Vitals Entered By: Glendell Docker CMA (February 21, 2010 3:09 PM)  O2 Flow:  Room air CC: follow-up visit Is Patient Diabetic? No Pain Assessment Patient in pain? no      Comments stent is bothering him, has a rubbing sensation on both ends, increase in ringing in both ears, refill on Omeprazole and Sulindac 150   Primary Care Provider:  Dondra Spry DO  CC:  follow-up visit.  History of Present Illness: 70 y/o white male with hx of PVD, hyperlipidemia, htn for f/u stents feel irritated 3-4 x per week, experiences discomfort feels like a metal hose that is frayed "ends are rubbing on something" no leg pain  htn - stable  Preventive Screening-Counseling & Management  Alcohol-Tobacco     Smoking Status: current     Smoking Cessation Counseling: yes  Allergies: No Known Drug Allergies  Past History:  Past Medical History: Hypertension Tobacco Abuse  9 mm right upper lobe pulmonary nodule 06/2006 (negative PET scan) Colon polyps 2009 - Repeat colonoscopy due 2011 (Dr. Leone Payor)     Hyperlipidemia   PVD - left leg (s/p stent)  Past Surgical History: Back surgery x 2 -  lumbar fusion  (over 30 yrs ago)       Family History: Mother is deceased at age 74.   Father died at age 72 of suicide.            Social History: The patient is happily married.  He lives with his wife.   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    Occasional alcohol.    Tobacco use - over 50 pack years.       Review of Systems       muscle gets weak with extended walks dog died recently  Physical Exam  General:  alert, well-developed, and well-nourished.   Lungs:  normal  respiratory effort, normal breath sounds, no crackles, and no wheezes.   Heart:  normal rate, regular rhythm, and no gallop.   Pulses:  diminished PD and PT pulses of right foot Extremities:  trace left pedal edema and trace right pedal edema.     Impression & Recommendations:  Problem # 1:  HYPERTENSION (ICD-401.9) Assessment Unchanged  His updated medication list for this problem includes:    Amlodipine Besylate 5 Mg Tabs (Amlodipine besylate) ..... One by mouth once daily    Avalide 150-12.5 Mg Tabs (Irbesartan-hydrochlorothiazide) ..... One by mouth qd  Orders: T-Basic Metabolic Panel ([id number])  BP today: 122/70 Prior BP: 128/70 (08/20/2009)  Labs Reviewed: K+: 4.7 (03/29/2009) Creat: : 0.91 (03/29/2009)   Chol: 124 (03/29/2009)   HDL: 33 (03/29/2009)   LDL: 52 (03/29/2009)   TG: 196 (03/29/2009)  Problem # 2:  HYPERLIPIDEMIA (ICD-272.4) Assessment: Unchanged  His updated medication list for this problem includes:    Crestor 40 Mg Tabs (Rosuvastatin calcium) ..... One by mouth qd  Orders: T-Hepatic Function [id number]) T-Lipid Profile [id number]) T-TSH [id number]) T-CK Total [id number])  Labs Reviewed: SGOT: 23 (03/29/2009)   SGPT: 27 (03/29/2009)   HDL:33 (03/29/2009), 29.30 (08/03/2008)  LDL:52 (03/29/2009), 107 (53/61/4431)  Chol:124 (03/29/2009), 175 (08/03/2008)  Trig:196 (03/29/2009), 196.0 (08/03/2008)  Problem # 3:  PVD (ICD-443.9) Assessment: Deteriorated  pt having intermittent pain in groin  he attributes to prev stend placement pulses feel normal arrange vascular follow up  Orders: Vascular Clinic (Vascular)  Complete Medication List: 1)  Omeprazole 20 Mg Cpdr (Omeprazole) .... Take 1 capsule by mouth twice a day 2)  Crestor 40 Mg Tabs (Rosuvastatin calcium) .... One by mouth qd 3)  Amlodipine Besylate 5 Mg Tabs (Amlodipine besylate) .... One by mouth once daily 4)  Avalide 150-12.5 Mg Tabs (Irbesartan-hydrochlorothiazide) .... One  by mouth qd 5)  Aspirin Ec 325 Mg Tbec (Aspirin) .... One by mouth qd 6)  Cyclobenzaprine Hcl 5 Mg Tabs (Cyclobenzaprine hcl) .... One by mouth two times a day as needed for muscle cramps  Patient Instructions: 1)  Please schedule a follow-up appointment in 6 months. Prescriptions: OMEPRAZOLE 20 MG CPDR (OMEPRAZOLE) Take 1 capsule by mouth twice a day  #60 x 5   Entered and Authorized by:   D. Thomos Lemons DO   Signed by:   D. Thomos Lemons DO on 02/21/2010   Method used:   Electronically to        [facility] [address] (retail)       [address]       [place], [place]  [address]       Ph: [phone] or [phone]       Fax: [phone]   RxID:   [id number] AVALIDE 150-12.5 MG TABS (IRBESARTAN-HYDROCHLOROTHIAZIDE) one by mouth qd  #90 x 3   Entered and Authorized by:   D. Thomos Lemons DO   Signed by:   D. Thomos Lemons DO on 02/21/2010   Method used:   Electronically to        [facility] [address] (retail)       [address]       [place], [place]  [address]       Ph: [phone] or [phone]       Fax: [phone]   RxID:   [id number] AMLODIPINE BESYLATE 5 MG TABS (AMLODIPINE BESYLATE) one by mouth once daily  #90 Tablet x 3   Entered and Authorized by:   D. Thomos Lemons DO   Signed by:   D. Thomos Lemons DO on 02/21/2010   Method used:   Electronically to        [facility] [address] (retail)       [address]       [place], [place]  [address]       Ph: [phone] or [phone]       Fax: [phone]   RxID:   [id number] CRESTOR 40 MG TABS (ROSUVASTATIN CALCIUM) one by mouth qd  #90 x 3   Entered and Authorized by:   D. Thomos Lemons DO   Signed by:   D. Thomos Lemons DO on 02/21/2010   Method used:   Electronically to        [facility] [address] [address] (retail)       [address]       [facility]  [place], [place]  [address]       Ph: [phone] or [phone]       Fax: [phone]   RxID:   [id number] CYCLOBENZAPRINE HCL 5 MG TABS (CYCLOBENZAPRINE HCL) one by mouth two times a day as needed for muscle cramps  #30 x 1   Entered and Authorized by:   D. Thomos Lemons DO   Signed by:   D. Thomos Lemons DO on 02/21/2010   Method used:   Electronically to        [facility] [address] (retail)       [address]       [place], [place]  [address]       Ph: [phone] or [phone]       Fax: [phone]   RxID:   [id number]    Orders Added: 1)  T-Basic Metabolic Panel [id number] 2)  T-Hepatic Function [phone] 3)  T-Lipid Profile [[id number]] 4)  T-TSH [15[address]-16073] 5)  T-CK Total [[address]-23[address]] 6)  Vascular Clinic [Vascular] 7)  Est. Patient Level III [[address]]   Immunization History:  Influenza Immunization History:    Influenza:  declined (02/21/2010)   Contraindications/Deferment of Procedures/Staging:    Test/Procedure: FLU VAX    Reason for deferment: patient declined   Immunization History:  Influenza Immunization History:    Influenza:  Declined (02/21/2010)

## 2010-04-17 NOTE — Letter (Signed)
   [place] [facility] [address] [place], [place]  [address]  [place] Phone: [phone]      February 25, 2010   [facility] [address] [place], [place] [address]  RE:  LAB RESULTS  Dear  Mr. AMBLE,  The following is an interpretation of your most recent lab tests.  Please take note of any instructions provided or changes to medications that have resulted from your lab work.  ELECTROLYTES:  Good - no changes needed  KIDNEY FUNCTION TESTS:  Good - no changes needed  LIVER FUNCTION TESTS:  Good - no changes needed  LIPID PANEL:  Stable - no changes needed Triglyceride: 321   Cholesterol: 130   LDL: 35   HDL: 31   Chol/HDL%:  4.2 Ratio  THYROID STUDIES:  Thyroid studies normal TSH: 3.390     Muscle enzyme blood test - normal       Sincerely Yours,    Dr. Thomos Lemons  Appended Document:  mailed

## 2010-04-17 NOTE — Letter (Signed)
Summary: [facility]  [facility]   Imported By: Lanelle Bal 03/05/2010 11:18:48  _____________________________________________________________________  External Attachment:    Type:   Image     Comment:   External Document

## 2010-04-22 NOTE — Procedures (Unsigned)
AORTA-ILIAC DUPLEX EVALUATION  INDICATION:  Left common iliac artery stent.  HISTORY: Diabetes:  No. Cardiac:  No. Hypertension:  Yes. Smoking:  Yes. Previous Surgery:  Left common iliac artery stent on 09/11/2008. Other:  The patient complains of discomfort at his waistline level on the left side and also bilateral intermittent claudication symptoms.              SINGLE LEVEL ARTERIAL EXAM                             RIGHT                  LEFT Brachial:                  145                    150 Anterior tibial:           144                    140 Posterior tibial:          163                    132 Peroneal: Ankle/brachial index:      1.09                   0.93 Previous ABI/date:         09/10/2009 1.01        09/10/2009 1.11  AORTA-ILIAC DUPLEX EXAM Aorta - Proximal     43 cm/s Aorta - Mid          100 cm/s Aorta - Distal       400 cm/s  RIGHT                                   LEFT                   CIA-PROXIMAL          184 cm/s                   CIA-DISTAL            154 cm/s                   HYPOGASTRIC           Not visualized                   EIA-PROXIMAL          117 cm/s                   EIA-MID               101 cm/s                   EIA-DISTAL            125 cm/s  IMPRESSION: 1. Patent left common iliac artery stent with no evidence of stenosis     noted. 2. There appears to be a velocity of 400 cm/s noted in the distal     abdominal aorta, based on decreased visualization. 3. Decreased visualization of the aortoiliac system due to overlying     bowel gas patterns. 4. The  bilateral ankle brachial indices and biphasic Doppler waveforms     suggest normal perfusion of the right lower extremity and mildly     decreased perfusion of the left lower extremity.  The left ankle     brachial index appears mildly decreased when compared to the     previous exam with the right ankle brachial index remaining  stable.       ___________________________________________ Quita Skye. [name], M.D.  CH/MEDQ  D:  04/15/2010  T:  04/15/2010  Job:  [id number]

## 2010-04-29 ENCOUNTER — Ambulatory Visit (HOSPITAL_COMMUNITY)
Admission: RE | Admit: 2010-04-29 | Discharge: 2010-04-29 | Disposition: A | Discharge status: Home / Self Care | Payer: Medicare Other | Source: Ambulatory Visit | Attending: Surgery | Admitting: Surgery

## 2010-04-29 DIAGNOSIS — I70219 Atherosclerosis of native arteries of extremities with intermittent claudication, unspecified extremity: Secondary | ICD-10-CM

## 2010-04-29 DIAGNOSIS — Z01812 Encounter for preprocedural laboratory examination: Secondary | ICD-10-CM | POA: Insufficient documentation

## 2010-04-29 DIAGNOSIS — Z01810 Encounter for preprocedural cardiovascular examination: Secondary | ICD-10-CM | POA: Insufficient documentation

## 2010-04-29 DIAGNOSIS — I700 Atherosclerosis of aorta: Secondary | ICD-10-CM | POA: Insufficient documentation

## 2010-04-29 LAB — POCT I-STAT, CHEM 8
Glucose, Bld: 106 mg/dL — ABNORMAL HIGH (ref 70–99)
HCT: 50 % (ref 39.0–52.0)
Sodium: 140 mEq/L (ref 135–145)
TCO2: 23 mmol/L (ref 0–100)

## 2010-04-29 LAB — POCT ACTIVATED CLOTTING TIME: Activated Clotting Time: 181 seconds

## 2010-05-18 NOTE — Op Note (Addendum)
NAMEAVIEN, Timothy Wood              ACCOUNT NO.:  [id number]  MEDICAL RECORD NO.:  [id number]           PATIENT TYPE:  O  LOCATION:  [facility]SC                         FACILITY:  [facility]  PHYSICIAN:  Juleen China IV, MDDATE OF BIRTH:  13-Jun-1940  DATE OF PROCEDURE:  04/29/2010 DATE OF DISCHARGE:  04/29/2010                              OPERATIVE REPORT   PREOPERATIVE DIAGNOSIS:  Bilateral claudication.  POSTOPERATIVE DIAGNOSIS:  Bilateral claudication.  PROCEDURE PERFORMED: 1. Ultrasound access, right femoral artery. 2. Abdominal aortogram. 3. Bilateral lower extremity runoff.  The patient was identified in the holding area and taken to room.  He was placed supine on the table.  The patient was prepped and draped in usual fashion.  Time-out was called.  The right femoral artery was evaluated with ultrasound, found to be widely patent.  A digital image was acquired.  It was accessed under ultrasound guidance by micropuncture needle.  Micropuncture wire was advanced followed by the micropuncture sheath.  This was exchanged out to a 5-French system over an 0.035 wire.  Omni flush catheter was advanced to the level of L1. Abdominal aortogram was obtained in the AP and lateral projections. This was followed by pulling the catheter down the aortic bifurcation and bilateral runoff was performed.  Pelvic angiogram and multiple obliquities were also obtained.  FINDINGS:  Aortogram:  The visualized portions of suprarenal abdominal aorta showed no significant disease.  There were single renal arteries bilaterally, which were widely patent.  There is diffuse disease throughout the infrarenal abdominal aorta.  There is area of ulceration in the midportion as well as the stenosis near the inferior mesenteric artery.  Bilateral common iliac arteries were widely patent.  The previously deployed left common iliac stent is widely patent.  The bilateral external iliac artery and hypogastric  arteries were widely patent.  Left lower extremity:  The left common femoral artery is widely patent. The left profunda femoral artery is widely patent.  Left superficial femoral artery is widely patent.  There is three-vessel runoff to the left foot.  The right lower extremity:  The right common femoral artery is widely patent.  The right profunda femoral artery is widely patent.  The right superficial femoral artery is widely patent, tight popliteal artery is widely patent, and the anterior tibial artery is difficult to evaluate distal to the mid calf.  There is runoff via the posterior tibia and peroneal artery.  Intervention:  At this point in time, the decision was made to intervene.  I used an end-hole catheter to check pressures in the aorta. We identified a stenosis, which was associated with a 20-25 point drop in pressure across.  This was at the level of the inferior mesenteric artery.  There was an irregular plaque above this; however, there was no pressure gradient across this plaque therefore I elected to treat just the area of stenosis, which is near the level of the IMA.  This also correlated with the ultrasound findings. At this point for a Rosen wire, a 7-French bright tip sheath was placed.  I selected an EV3 14 x 40 self- expanding stent.  This was deployed across the lesion and deployed.  It was molded using a 10-mm balloon.  I then checked a pressure gradient above and below the stent.  The pressure gradient, which was originally 25 was now down to the 0.  A completion angiogram was performed, which showed adequate result, a completion picture shows preservation of the inferior mesenteric artery.  There is irregularity in the aorta proximal to the stent, however, again no hemodynamic significance is associated with this.  At this point, decision was made to terminate the procedure. Catheters and wires were removed.  The patient was taken to the holding area for  sheath pull.  IMPRESSION: 1. Distal aortic stenosis successfully treated with EV3 14 x 40 self-     expanding stent. 2. Proximal aortic ulceration that was not associated with a     hemodynamically significant stenosis when measured by pressure     gradients. 3. Widely patent left common iliac stent. 4. No outflow disease bilaterally.  Three-vessel runoff on the left     and two-vessel runoff on the right through the posterior tibial and     peroneal.     Juleen China IV, MD     VWB/MEDQ  D:  04/29/2010  T:  04/30/2010  Job:  [id number]  Electronically Signed by Arelia Longest IV MD on 05/18/2010 07:39:14 PM

## 2010-06-02 ENCOUNTER — Ambulatory Visit: Payer: Medicare Other | Admitting: Vascular Surgery

## 2010-06-03 ENCOUNTER — Encounter (INDEPENDENT_AMBULATORY_CARE_PROVIDER_SITE_OTHER): Payer: Medicare Other

## 2010-06-03 ENCOUNTER — Ambulatory Visit (INDEPENDENT_AMBULATORY_CARE_PROVIDER_SITE_OTHER): Payer: Medicare Other | Admitting: Vascular Surgery

## 2010-06-03 DIAGNOSIS — I70219 Atherosclerosis of native arteries of extremities with intermittent claudication, unspecified extremity: Secondary | ICD-10-CM

## 2010-06-03 DIAGNOSIS — Z48812 Encounter for surgical aftercare following surgery on the circulatory system: Secondary | ICD-10-CM

## 2010-06-04 NOTE — Assessment & Plan Note (Signed)
OFFICE VISIT  Timothy, Wood DOB:  07-18-1940                                       06/03/2010 EAVWU#:[id number]  The patient returns for initial follow-up regarding his intervention performed by Dr. Myra Gianotti on February 14 of this year.  He had developed a severe distal aortic stenosis causing bilateral lower extremity claudication symptoms which were limiting him quite severely.  He had previously had a left common iliac artery stent which was widely patent. He was treated with angioplasty and stenting by Dr. Myra Gianotti and has had an excellent early result.  He states that his claudication symptoms have been completely relieved and he is able to ambulate as long as he would like until he is fatigued in general.  CHRONIC MEDICAL PROBLEMS: 1. Hypertension. 2. Hyperlipidemia. 3. Degenerative joint disease, previous lumbar spine surgery. 4. Negative for coronary artery disease, diabetes or stroke.  Negative     other than chronic back pain.  PHYSICAL EXAMINATION:  Blood pressure 119/81, heart rate is 81, respirations 20.  Generally, he is a well-developed, well-nourished male in no apparent distress, alert and oriented x3.  HEENT:  Exam is normal for age.  EOMs intact.  Lungs:  Clear to auscultation.  No rhonchi or wheezing.  Cardiovascular:  A regular rhythm, no murmurs.  Carotid pulses 3+, no audible bruits.  Abdomen:  Soft, nontender, with no masses.  Musculoskeletal exam is free of major deformities.  Lower extremity exam reveals 3+ femoral, 2+ popliteal and 2+ posterior tibial pulses bilaterally.  Today I ordered lower extremity ABIs, which I reviewed and interpreted. ABIs have improved in both legs over 1.0 index since his stenting procedure.  I have reassured him regarding these findings, also encouraged him to discontinue smoking.  He will return on a regular schedule, should be checked in the lab in 6 months as his next visit unless he  develops any new symptoms in the interim.    Quita Skye [name], M.D. Electronically Signed  JDL/MEDQ  D:  06/03/2010  T:  06/04/2010  Job:  [id number]

## 2010-06-23 LAB — APTT: aPTT: 30 seconds (ref 24–37)

## 2010-06-23 LAB — CROSSMATCH
ABO/RH(D): B POS
Antibody Screen: NEGATIVE

## 2010-06-23 LAB — DIFFERENTIAL
Eosinophils Absolute: 0.1 10*3/uL (ref 0.0–0.7)
Eosinophils Relative: 2 % (ref 0–5)
Lymphs Abs: 1.8 10*3/uL (ref 0.7–4.0)
Monocytes Relative: 10 % (ref 3–12)
Neutrophils Relative %: 59 % (ref 43–77)

## 2010-06-23 LAB — CBC
HCT: 47 % (ref 39.0–52.0)
Hemoglobin: 16.6 g/dL (ref 13.0–17.0)
MCHC: 35.2 g/dL (ref 30.0–36.0)
Platelets: 168 10*3/uL (ref 150–400)
RDW: 13 % (ref 11.5–15.5)
WBC: 6.2 10*3/uL (ref 4.0–10.5)

## 2010-06-23 LAB — COMPREHENSIVE METABOLIC PANEL
AST: 19 U/L (ref 0–37)
Albumin: 4.3 g/dL (ref 3.5–5.2)
CO2: 25 mEq/L (ref 19–32)
Chloride: 108 mEq/L (ref 96–112)
Creatinine, Ser: 0.79 mg/dL (ref 0.4–1.5)
GFR calc Af Amer: >60 mL/min (ref >60)
Glucose, Bld: 111 mg/dL — ABNORMAL HIGH (ref 70–99)
Potassium: 4.2 mEq/L (ref 3.5–5.1)
Sodium: 139 mEq/L (ref 135–145)

## 2010-06-23 LAB — PROTIME-INR: Prothrombin Time: 13.3 seconds (ref 11.6–15.2)

## 2010-07-16 ENCOUNTER — Telehealth: Payer: Self-pay | Admitting: Internal Medicine

## 2010-07-16 NOTE — Telephone Encounter (Signed)
Ok to refill x 1  

## 2010-07-16 NOTE — Telephone Encounter (Signed)
[facility]  refill on cyclobenzaprine hcl 5 mg tab take 1 tab twice daily last fill 05-21-10

## 2010-07-17 MED ORDER — CYCLOBENZAPRINE HCL 5 MG PO TABS: 5.0000 mg | ORAL_TABLET | Freq: Two times a day (BID) | ORAL | Status: DC

## 2010-07-17 NOTE — Telephone Encounter (Signed)
Rx refill sent to pharmacy. 

## 2010-07-29 NOTE — Op Note (Signed)
Timothy Wood, Timothy Wood              ACCOUNT NO.:  [id number]   MEDICAL RECORD NO.:  [id number]          PATIENT TYPE:  AMB   LOCATION:  SDS                          FACILITY:  [facility]   PHYSICIAN:  Juleen China IV, MDDATE OF BIRTH:  26-Aug-1940   DATE OF PROCEDURE:  09/11/2008  DATE OF DISCHARGE:  09/11/2008                               OPERATIVE REPORT   PREOPERATIVE DIAGNOSIS:  Left leg claudication.   POSTOPERATIVE DIAGNOSIS:  Left leg claudication.   PROCEDURE PERFORMED:  1. Ultrasound access, right femoral artery.  2. Abdominal aortogram.  3. Second-order catheterization.  4. Left lower extremity runoff.  5. Stent, left common iliac artery.   INDICATIONS:  This is a 70 year old gentleman with lifestyle-limiting  claudication in the left leg.  He comes in today for arteriogram,  possible intervention.   PROCEDURE:  The patient was identified in the holding area and taken to  room #8.  He was placed supine on table.  Bilateral groins were prepped  and draped in the standard sterile fashion.  A time-out was called.  Right femoral artery was evaluated with ultrasound and found to be  widely patent.  Lidocaine 1% was used for local anesthesia.  Right  femoral artery was accessed under ultrasound guidance with an 18-gauge  needle and a 0.035 Bentson wire was advanced into the abdominal aorta  under fluoroscopic visualization.  A 5-French sheath was placed.  Omni  flush catheter was advanced to the level of L1.  Abdominal aortogram was  obtained.  Next, the catheter was pulled down the aortic bifurcation and  multiple pelvic angiograms were performed with several obliquities.  Then, using the Bentson wire and Omni flush catheter, the aortic  bifurcation was crossed.  The catheter was placed in the left external  iliac artery and left leg runoff was obtained.   FINDINGS:  Aortogram:  The visualized portions of the suprarenal  abdominal aorta showed minimal disease.  There are  single renal arteries  bilaterally which are widely patent.  There are mild to moderate  atherosclerotic changes within the infrarenal abdominal aorta.  There  are no hemodynamically significant stenoses.   Pelvic angiogram:  Right common, external, and internal iliac arteries  are all widely patent.  There is a high-grade stenosis in the midportion  of the left common iliac artery.  The left external iliac and  hypogastric arteries are widely patent.   Left lower extremity:  Left common femoral artery is widely patent.  The  left profunda femoral artery is widely patent.  The left superficial  femoral artery is patent throughout its course.  The popliteal artery is  widely patent.  The anterior tibial is patent down across the ankle.  The posterior tibial occludes proximally, but reconstitutes from the  peroneal artery and then across the ankle.   After above images were obtained, decision was made to intervene.  Over  a Rosen wire, a 7-French Terumo sheath was advanced into the left iliac  arterial system.  At this point in time, the patient was given systemic  heparinization.  I elected to place a  balloon expandable stent across  the common iliac artery lesion.  A Cordis Genesis 8 x 29 stent was  advanced across the lesion in the common iliac artery.  The stent was  deployed taking the balloon to 10 atmospheres.  Followup angiogram  revealed irregularity within the distal portion of the stent and I  therefore elected to extend this down to the takeoff of the hypogastric  artery.  This was done using a Cordis Genesis 8 x 18 balloon expandable  stent taken to 10 atmospheres.  This landed at the level of the  hypogastric artery which was preserved.  Followup study revealed  adequate result.  The patient had palpable dorsalis pedis pulse.  With  these findings, I elected to terminate the procedure.  Catheters and  wires were removed.  The sheath was pulled back in the right external   iliac artery for sheath pull once his coagulation profile corrects.   IMPRESSION:  1. Mild to moderate atherosclerotic changes within the infrarenal      abdominal aorta without significant stenosis.  2. High-grade left common iliac artery stenosis, successfully treated      using a Cordis Genesis 8 x 29 and 8 x 18 balloon expandable stent.  3. Dominant runoff of the anterior tibial artery which was palpable at      the end of the procedure.      Jorge Ny, MD  Electronically Signed     VWB/MEDQ  D:  09/11/2008  T:  09/12/2008  Job:  [id number]

## 2010-07-29 NOTE — Assessment & Plan Note (Signed)
[facility]                         GASTROENTEROLOGY OFFICE NOTE   Timothy, Wood                     MRN:          [id number]  DATE:04/26/2007                            DOB:          07/24/1940    CHIEF COMPLAINT:  Black stools.   [name] is a 70 year old white man that I follow for a large cecal  polyp that we are trying to eliminate.  About a month or so ago, or  maybe 3 to 4 weeks at most, he developed some diarrhea which he  attributes to being in a nursing home where there were some ill  patients.  He was visiting some people there.  He has had some dark  stools, and then the stools became blacker.  He was using Pepto Bismol,  but says his stools were dark before that.  He has had sort of a  chronic, ongoing, periumbilical ache, low grade, for a year or more as  well.  He is not having any fevers or chills or nausea or vomiting at  this time.  There is no bright red blood per rectum or hematochezia.  He  says that the bowel movements have returned to normal in the last couple  of days.   PAST MEDICAL HISTORY:  1. Colon polyps with large cecal polyp due for a repeat exam to see if      this was completely ablated after snare and APC, May 2009.  2. Family history of colon cancer.  3. Diverticulosis.  4. Dyslipidemia.  5. Gastroesophageal reflux disease clinically, on Prilosec.  6. Low back pain and surgery.  7. Left knee surgery.  8. Prior tonsillectomy.  9. Hypertension.  10.Dyslipidemia.  11.Smoker.  12.Remote history of alcohol problems 30 years ago.  13.Minimal chronic obstructive pulmonary disease by pulmonary function      testing.  14.Solitary pulmonary nodule evaluated by Dr. Sherene Sires last year.      Negative PET scan.  15.Back surgeries were x2.   MEDICATIONS:  Listed and reviewed in the chart.  They include  simvastatin, Diovan, hydrochlorothiazide, fish oil, Prilosec, Tricor,  Zocor.   No known drug allergies.   SOCIAL/FAMILY HISTORY/REVIEW OF SYSTEMS:  As per my medical history  form.  He is retired.  His wife is now retired now.  He has been a  Education administrator.  He is a smoker still.  He has been counseled to quit  previously.   PHYSICAL EXAMINATION:  Well developed, well nourished, no acute  distress.  Height 5 feet 8 inches, weight 177 pounds, blood pressure 140/84, pulse  64.  EYES:  Anicteric.  ENT:  Dentures, otherwise free of lesions.  NECK:  Supple without thyromegaly or mass.  CHEST:  Clear.  HEART:  S1, S2, no murmurs, rubs, or gallops.  ABDOMEN:  Soft, nontender, no organomegaly or masses.  RECTAL EXAM:  Brown Heme negative stool.  GU:  Prostate feels normal.  LYMPHATIC:  No neck or supraclavicular nodes.  EXTREMITIES:  No edema.  SKIN:  No rash.  PSYCH:  He is alert and oriented x3.  NEURO:  Cranial nerves II-XII  are intact.   ASSESSMENT:  1. Change in bowels with change in color, question abnormal contents.  2. History of colon polyps.  3. Chronic periumbilical pain.   I suspect this change in color was medication related with Pepto Bismol  after a viral or bacterial gastroenteritis that has cleared.  The  periumbilical pain is not clear.  He is on some Prilosec.  It may be an  irritable bowel phenomenon.   PLAN:  Esophagogastroduodenoscopy and a colonoscopy in the near future  with plans to see if this polyp was ablated.  We will do these at the  hospital so I have the argon plasma coagulator available, further plans  pending that.  CBC is checked as well to see if he was having any  bleeding and anemia.     Iva Boop, MD,FACG  Electronically Signed    CEG/MedQ  DD: 04/26/2007  DT: 04/27/2007  Job #: [id number]   cc:   Barbette Hair. Artist Pais, DO

## 2010-07-29 NOTE — Procedures (Signed)
AORTA-ILIAC DUPLEX EVALUATION   INDICATION:  Follow up left common iliac artery stent.   HISTORY:  Diabetes:  No.  Cardiac:  No.  Hypertension:  Yes.  Smoking:  Yes.  Previous Surgery:  Left common iliac artery stent on 09/11/2008.               SINGLE LEVEL ARTERIAL EXAM                              RIGHT                  LEFT  Brachial:                  119                    127  Anterior tibial:           119                    116  Posterior tibial:          127                    141  Peroneal:  Ankle/brachial index:      1.0                    1.11  Previous ABI/date:         03/14/2009, 1.15       03/14/2009, 1.12   AORTA-ILIAC DUPLEX EXAM  Aorta - Proximal     99 cm/s  Aorta - Mid          95 cm/s  Aorta - Distal       105 cm/s   RIGHT                                   LEFT  184 cm/s          CIA-PROXIMAL          178 cm/s  126 cm/s          CIA-DISTAL            211 cm/s  Not visualized    HYPOGASTRIC           Not visualized  160 cm/s          EIA-PROXIMAL          119 cm/s  115 cm/s          EIA-MID               100 cm/s  107 cm/s          EIA-DISTAL            154 cm/s   IMPRESSION:  1. Patent left common iliac artery stent with no hemodynamically      significant stenosis of the bilateral iliac arteries.  2. Unable to adequately visualize the bilateral hypogastric arteries      due to overlying bowel gas patterns.  3. Stable bilateral ankle brachial indices noted.   ___________________________________________  Quita Skye [name], M.D.   CH/MEDQ  D:  09/10/2009  T:  09/11/2009  Job:  [id number]

## 2010-07-29 NOTE — Procedures (Signed)
AORTA-ILIAC DUPLEX EVALUATION   INDICATION:  Left CIA stent.   HISTORY:  Diabetes:  No.  Cardiac:  No.  Hypertension:  Yes.  Smoking:  Yes.  Previous Surgery:  Left CIA stent 09/11/2008.               SINGLE LEVEL ARTERIAL EXAM                              RIGHT                  LEFT  Brachial:                  127                    126  Anterior tibial:           115                    112  Posterior tibial:          146                    142  Peroneal:  Ankle/brachial index:      1.15                   1.12  Previous ABI/date:         10/09/2008, 1.02       10/09/2008, 0.98   AORTA-ILIAC DUPLEX EXAM  Aorta - Proximal     47 cm/s  Aorta - Mid          73 cm/s  Aorta - Distal       49 cm/s   RIGHT                                   LEFT  54 cm/s           CIA-PROXIMAL          117 cm/s  69 cm/s           CIA-DISTAL            75 cm/s  72 cm/s           HYPOGASTRIC           71 cm/s  140 cm/s          EIA-PROXIMAL          71 cm/s  153 cm/s          EIA-MID               149 cm/s  153 cm/s          EIA-DISTAL            133 cm/s   IMPRESSION:  1. Patent left common iliac artery stent with no evidence of stenosis.  2. Ankle brachial indices are within normal limits.   ___________________________________________  V. Charlena Cross, MD   CJ/MEDQ  D:  03/14/2009  T:  03/14/2009  Job:  [id number]

## 2010-07-29 NOTE — Assessment & Plan Note (Signed)
[facility]                             PULMONARY OFFICE NOTE   IRELAND, CHAGNON                     MRN:          [id number]  DATE:07/12/2006                            DOB:          [birth year]    REASON FOR CONSULTATION:  Abnormal chest x-ray.   HISTORY:  70 year old white male, active smoker, in for routine  evaluation by Dr. Artist Pais which revealed a right upper lobe nodule which was  9-mm by CT scan obtained on June 22, 2006 but not comparable to any  previous x-rays.  He did have multiple ill-defined low attenuation liver  lesions of unknown significance as well.   However, the patient states he has no problems presently.  That is he is  able to walk his dog for up to a mile briskly and the only thing he  notices is his legs get tired.  He specifically denies any dyspnea,  chest pain, fever, chills, sweats, unintended weight loss, or  significant cough.   PAST MEDICAL HISTORY:  1. Hypertension.  2. Remote knee surgery.  3. Remote back surgery.   ALLERGIES:  NON KNOWN.   MEDICATIONS:  Taken in detail on the worksheet, reviewed the patient  column dated July 12, 2006, correct as listed and note that he has no  pulmonary medicines.   SOCIAL HISTORY:  He continues to smoke a pack per day.  He is retired.   FAMILY HISTORY:  Significant for mother had colon cancer that spread to  the lung.   REVIEW OF SYSTEMS:  Taken in detail on the worksheet, negative except as  outlined above.   PHYSICAL EXAMINATION:  This is a stoic, ambulatory white male in no  acute distress.  Patient had afebrile vital signs.  HEENT:  Minimum nonspecific turbinate edema.  NECK:  Supple without cervical adenopathy, tenderness, trachea is  midline, no thyromegaly.  LUNGS:  Slight hyperresonance to percussion with decreased breath sounds  and a Hoover's sign is positive at the end of inspiration.  Regular rhythm without murmur, gallop, rub present.  ABDOMEN:  Soft, benign, with no palpable organomegaly, mass, or  tenderness.  EXTREMITIES:  Warm without calf tenderness, cyanosis, clubbing, or  edema.   PFTs reviewing the patient from July 12, 2006 indicate a minimum  airflow obstruction pattern and a normal diffusion capacity.   IMPRESSION:  1. This patient has minimum chronic obstructive pulmonary disease by      pulmonary function tests and would be an excellent operative      candidate if the right lower lobe nodule proves positive on PET      scan (see below).  Nevertheless he needs to stop smoking completely      at this point, both to reduce his risk of future cancer, and also      improve his operative risk in the event that a right lower      lobectomy will be needed.  2. Solitary pulmonary nodule in the right lower lobe.  This is just      above the threshold for PET scan sensitivity and because  we do not      have any previous x-rays I would recommend a PET scan be done first      and if positive proceed directly to excisional biopsy by either a      segmentectomy or a right lower lobectomy, which he could tolerate      well.  I explained all the reasons behind this recommendation to      the patient in detail today.     Charlaine Dalton. Sherene Sires, MD, [facility]  Electronically Signed    MBW/MedQ  DD: 07/13/2006  DT: 07/13/2006  Job #: [id number]   cc:   Barbette Hair. Artist Pais, DO

## 2010-07-29 NOTE — Assessment & Plan Note (Signed)
[facility]                             PULMONARY OFFICE NOTE   MEGAN, HAYDUK                     MRN:          [id number]  DATE:08/17/2006                            DOB:          07-05-1940    HISTORY:  This is a 70 year old white male active smoker seen at Dr.  Olegario Messier request on April 28 with minimum evidence of COPD and a right  lower lobe nodule which on PET scan showed no activity.   He comes in for followup today, having not been able to make any headway  at all in terms of smoking and is still at a pack per day. However, he  denies any pleuritic pain, fever, chills, sweats, orthopnea, PND, leg  swelling, history of hemoptysis or unintended weight loss.   On physical examination, he is a pleasant, ambulatory, white male in no  acute distress. He has stable vital signs.  HEENT:  Unremarkable. Oropharynx clear.  LUNGS:  Fields revealed minimal rhonchi bilaterally.  HEART:  Reveals a regular rhythm without murmur, gallop or rub.  ABDOMEN:  Soft, benign.  EXTREMITIES:  Warm without calf tenderness, clubbing, cyanosis, or  edema.   PFTs were reviewed with the patient from April 28 indicating a FEV1 of  105 with a ratio follow up 64%.   IMPRESSION:  The patient only has very minimum chronic obstructive  pulmonary disease by FEV1 to FVC ratios, but evidence of rhonchi on exam  and a lung nodule that very well could prove to be a well-differentiated  peripheral malignancy. Therefore, he is not in anyway off the hook in  terms of what he needs to do to get better. Specifically, my  recommendations are that he commit to and follow through on a smoking  cessation plan (presently, he is not really ready to quit, so asking him  to start a smoking cessation plan is not likely to work). I have asked  him to discuss this with Dr. Artist Pais.   Also, I have asked him to return in 3 months for chest x-ray. The nodule  in question can be clearly seen on  plain films dated April 15, 2006,  and if there is any growth at all in this lesion, it needs to be removed  by excisional biopsy, since this patient is a surgical candidate.     Charlaine Dalton. Sherene Sires, MD, [facility]  Electronically Signed    MBW/MedQ  DD: 08/18/2006  DT: 08/18/2006  Job #: [id number]   cc:   Barbette Hair. Artist Pais, DO

## 2010-07-29 NOTE — Consult Note (Signed)
NEW PATIENT CONSULTATION   Timothy Wood, Timothy Wood  DOB:  June 21, 1940                                       08/28/2008  [id number]#:[id number]   The patient is a 70 year old male referred by Dr. Artist Pais for left leg  claudication symptoms.  The gentleman for the last 6 months has been  having two sets of symptoms.  The first involves his calf which becomes  heavy and tired after walking a half block and eventually becomes numb  requiring him to stop walking.  This is relieved in about 5-10 minutes.  He also develops a severe pain in his left inguinal area which also  causes him to stop walking.  These symptoms have worsened over the last  6 months.  He has some chronic numbness and tingling in his right lower  extremity which he attributes to lumbar laminectomy in the past which  resulted in some nerve damage.  No history of nonhealing ulcer or  gangrene in either leg.   PAST MEDICAL HISTORY:  1. Hypertension.  2. Hyperlipidemia.  3. Degenerative joint disease in the spine.  4. Negative for diabetes, coronary artery disease, COPD or stroke.   PAST SURGICAL HISTORY:  Includes lumbar laminectomy.   FAMILY HISTORY:  Positive for coronary artery disease in his brother who  died of myocardial infarction.  Negative for diabetes and stroke.   SOCIAL HISTORY:  He is married and has two children and is retired.  He  is trying to quit smoking cigarettes, down to about 4-6 cigarettes per  day but has a greater than 50 pack year history.  Does not use alcohol.   REVIEW OF SYSTEMS:  Positive for weight gain, occasional chest  discomfort for reflux esophagitis, joint pain but denies dyspnea on  exertion, PND, orthopnea, bronchitis.   ALLERGIES:  None known.   MEDICATIONS:  Please see health history.   PHYSICAL EXAMINATION:  Vital signs:  Blood pressure 115/70, heart rate  100, respirations 76.  General:  He is a healthy-appearing male in no  apparent distress, alert and oriented  x3.  Neck:  Is supple, 3+ carotid  pulses palpable.  No bruits are audible.  Neurological:  Normal.  No  palpable adenopathy in the neck.  Chest:  Clear to auscultation.  Cardiovascular:  Regular rhythm.  No murmurs.  Abdomen:  Soft, nontender  with no masses.  Extremity:  Reveals the right leg has 3+ femoral,  popliteal and posterior tibial pulses.  Left leg has a 1-2+ femoral  pulse.  No popliteal or distal pulses.  Both feet are adequately  perfused and neurologically intact.   Lower extremity arterial Dopplers done at [facility] revealed an  ABI of 0.86 on the right and 0.56 on the left with possible inflow  disease on the left.   I think the patient definitely has evidence of iliac occlusive disease  on the left and may well have superficial femoral occlusive disease as  well.  His symptoms in the inguinal area are somewhat unusual but does  have classic claudication symptoms otherwise.  I have scheduled him for  an angiogram to be performed by Dr. Myra Gianotti on June 29 with probable PTA  and stenting of his left iliac artery at that time.  He may well require  left femoral-popliteal bypass grafting as well and that is tentatively  scheduled for Thursday July 1 depending on the angiographic findings.  This was discussed with the patient who would like to proceed.   Quita Skye [name], M.D.  Electronically Signed   JDL/MEDQ  D:  08/28/2008  T:  08/29/2008  Job:  [id number]   cc:   Barbette Hair. Artist Pais, DO

## 2010-07-29 NOTE — Assessment & Plan Note (Signed)
OFFICE VISIT   Timothy Wood, Timothy Wood  DOB:  [birth year]                                       10/09/2008  [id number]#:[id number]   The patient returns for followup regarding his left common iliac PTA and  stenting procedure performed by Dr. Myra Gianotti June 29 for severe left leg  claudication symptoms.  He had a Cordis balloon expandable stent placed  in his left common iliac artery with an excellent early result.  He was  found to have a patent superficial femoral and popliteal artery with  good one vessel runoff through the anterior tibial artery.  The patient  reports that his left thigh and calf symptoms are completely relieved as  is his left inguinal pain which was somewhat unusual but obviously  related to his iliac stenosis since it is no longer present.  He has no  claudication in either leg and is able to walk long distances without  stopping.  He is taking one aspirin 325 mg per day.   PHYSICAL EXAMINATION:  Blood pressure 121/79, heart rate 84,  respirations 14.  He has 3+ femoral, popliteal and dorsalis pedis pulses  palpable bilaterally.  Both feet are well-perfused.  No evidence of  pseudoaneurysm in the right inguinal area.   ABIs today are 0.98 on the left compared to 0.56 preoperatively and 1.0  on the right.  He will return to see [place] on a regular basis in the  vascular lab for the stent protocol and see the physician as necessary.   Quita Skye [name], M.D.  Electronically Signed   JDL/MEDQ  D:  10/09/2008  T:  10/10/2008  Job:  [id number]   cc:   Barbette Hair. Artist Pais, DO

## 2010-08-01 NOTE — Op Note (Signed)
[place]  Patient:    Timothy Wood, Timothy Wood                       MRN: [id number] Proc. Date: 04/19/00 Attending:  Catalina Lunger, M.D.                           Operative Report  PREOPERATIVE DIAGNOSES: 1. Left groin sebaceous cyst. 2. Abdominal wall nevi x 2 and facial nevi.  PROCEDURE:  Excisional biopsy of all of the above.  ANESTHESIA:  Local MAC.  SURGEON:  Catalina Lunger, M.D.  DESCRIPTION OF PROCEDURE:  The patient was taken to the operating room and placed in the supine position.  After adequate MAC anesthesia was induced, the left temporal region, the left upper quadrant, the right lower quadrant, and the left groin were prepped and draped.  Using 1% lidocaine local anesthesia, the skin of the temple was anesthetized.  An elliptical incision was made around the nevus, and it was excised off the dermis.  It was treated with silver nitrate.  A similar procedure procedure was performed on the following two nevi and also treated with silver nitrate.  The left groin sebaceous cyst was injected using 1% lidocaine local anesthesia.  An elliptical incision was made.  I dissected down to normal subcutaneous fat, excising the cyst in its entirety.  This defect was closed with interrupted 3-0 nylon sutures. Dressings were applied.  The patient tolerated the procedure well and went to PACU in good condition. DD:  04/19/00 TD:  04/20/00 Job: [id number]

## 2010-08-01 NOTE — Assessment & Plan Note (Signed)
[facility]                           PRIMARY CARE OFFICE NOTE   ADITHYA, DIFRANCESCO                     MRN:          [id number]  DATE:04/15/2006                            DOB:          20-Nov-1940    HISTORY AND PHYSICAL   CHIEF COMPLAINT:  New patient to practice.   HISTORY OF PRESENT ILLNESS:  The patient is a 70 year old white male  here to establish primary care.  He was formerly followed by Dr. Lynelle Doctor  at [facility], but due to insurance reasons, he is transferring to  [facility].   PAST MEDICAL HISTORY:  Significant for hypertension and high  cholesterol.  He has been on Diovan and Advicor.  He was noted to have  an ACE inhibitor cough in the past.  He does take his medications on a  regular basis.  He does complain of flushing sensation with Advicor and  high cost.   He has a long history of tobacco abuse, greater than 50 pack hears.  Dr.  Lynelle Doctor has tried to assist in smoking cessation in the past.  He has  tried Chantix but failed.  He admits to not being ready to quit.   REVIEW OF MEDICAL RECORDS:  He underwent excision of a dysplastic nevi  x2 in his abdomen in 2002, and also lance to the left groin for the  patient's cyst.   He denies any history of heart disease or stroke, or type 2 diabetes.  It has been approximately 6 months since his last blood tests.   PAST MEDICAL HISTORY:   SUMMARY:  1. Hypertension.  2. Hyperlipidemia.  3. Tobacco abuse with probable COPD.  4. Remote history of alcohol abuse.  His last drink was 28 years ago.  5. Status post knee surgery complicated by DVT.  6. Status post 2 back surgeries.   CURRENT MEDICATIONS:  1. Advicor 500/20 two at bedtime.  2. Diovan 80/12.5 one a day.   ALLERGIES TO MEDICATIONS:  None known.   SOCIAL HISTORY:  The patient is married.  He lives with his wife.  He is  happily married.  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   FAMILY HISTORY:  Mother is  deceased at age 39.  She is known to have  colon cancer, which was diagnosed in the 39s.  Father died at age 65  of suicide.   HABITS:  Occasional alcohol.  Tobacco use as noted above.  No  recreational drug use.   REVIEW OF SYSTEMS:  No fevers or chills.  The patient has mild  hoarseness, some chronic tinnitus.  The patient denies any chest pain.  Denies dyspnea.  Frequent heartburn.  No nausea or vomiting.  No dark  stools or blood in the stool.  He has never had a colonoscopy.  Denies  dysuria, frequency, or urgency.  All other systems negative.   PHYSICAL EXAM:  VITAL SIGNS:  Weight is 180 pounds, temperature is 97.2,  pulse is 66, BP is 140/80 in the left arm manual cuff.  GENERAL:  The patient is a pleasant well-developed, well-nourished  73-  year-old white male who appears slightly older than stated age.  There  is a strong odor of tobacco.  HEENT:  Normocephalic, atraumatic.  Pupils equal, round, and reactive to  light bilaterally.  Extraocular motility intact.  The patient was  anicteric.  Conjunctivae within normal limits.  TMs clear bilaterally.  Oropharyngeal exam was unremarkable besides upper and lower dentition.  NECK:  Supple.  No adenopathy, carotid bruit, or thyromegaly.  CHEST:  Normal respiratory effort.  Chest was clear to auscultation  bilaterally.  No rhonchi, rales, or wheezing.  CARDIOVASCULAR:  Regular rate and rhythm.  No significant murmurs, rubs,  or gallops appreciated.  ABDOMEN:  Soft, nontender, positive bowel sounds.  No organomegaly.  MUSCULOSKELETAL:  No cyanosis, clubbing, or edema.  SKIN:  Warm and dry.  NEUROLOGIC:  Cranial nerves 2-12 grossly intact.  He was nonfocal.   IMPRESSION/RECOMMENDATIONS:  1. Hypertension.  2. Hyperlipidemia.  3. History of colon polyps.  4. Ongoing tobacco use with probable chronic obstructive pulmonary      disease.  5. Status post knee surgery complicated by deep vein thrombosis 6      years ago.  6. History  of alcohol abuse.  7. Health maintenance/family history of colon cancer.   RECOMMENDATIONS:  The patient is to continue with his medications for  now.  We will repeat his fasting liver profile today since he is  fasting, get LFTs.  We will check a thyroid study.  Also screening for  diabetes and check PSA.   I strongly urge the patient to discontinue smoking.  He is not ready at  this time and defers any medications to assist in smoking cessation.  We  discussed the natural progression of COPD.   On followup visit, we will discuss starting a PPI versus taking Pepcid  AC, which does not seem to be controlling his symptoms.  Due to his long  tobacco history, he may also need an EGD when he gets referred for his  colonoscopy.   Followup time is in approximately 2 to 3 weeks.  Please note that we  added a screening chest x-ray today.     Barbette Hair. Artist Pais, DO  Electronically Signed    RDY/MedQ  DD: 04/15/2006  DT: 04/15/2006  Job #: [id number]

## 2010-08-01 NOTE — Op Note (Signed)
[place]  Patient:    Timothy Wood, Timothy Wood                     MRN: [id number] Proc. Date: 05/20/00 Adm. Date:  [id number] Attending:  Stephenie Acres                           Operative Report  PREOPERATIVE DIAGNOSIS:  Dysplastic nevi of the abdomen x 2.  POSTOPERATIVE DIAGNOSIS:  Dysplastic nevi of the abdomen x 2.  OPERATION PERFORMED:  Wide excision of dysplastic nevi of the abdomen x 2.  SURGEON:  Earna Coder, M.D.  ANESTHESIA:  Local MAC.  DESCRIPTION OF PROCEDURE:  The patient was taken to the operating room and placed in the supine position.  After adequate anesthesia was induced using MAC technique, the areas in the left upper quadrant and right lower quadrant were prepped and draped in normal sterile fashion.  Using 1% lidocaine with bicarb, the skin and the subcutaneous tissues around the left upper quadrant scar was infiltrated.  Elliptical incision was made around the previous scar and all tissue was excised down to the subcutaneous fat.  The wound was closed with interrupted 3-0 nylon sutures.  A similar excision was then performed with local infiltration of lidocaine with bicarb in the right lower quadrant. This was closed in a similar fashion.  Margins were marked and this was sent for pathologic evaluation.  Sterile dressings were applied. The patient tolerated the procedure well and went to PACU in good condition. DD:  05/20/00 TD:  05/20/00 Job: [id number]

## 2010-08-12 ENCOUNTER — Encounter: Payer: Self-pay | Admitting: Internal Medicine

## 2010-08-18 ENCOUNTER — Ambulatory Visit: Payer: Self-pay | Admitting: Internal Medicine

## 2010-08-19 ENCOUNTER — Ambulatory Visit: Payer: Medicare Other | Admitting: Family Medicine

## 2010-09-01 ENCOUNTER — Other Ambulatory Visit: Payer: Self-pay

## 2010-09-10 ENCOUNTER — Ambulatory Visit: Payer: Medicare Other | Admitting: Internal Medicine

## 2010-09-11 ENCOUNTER — Encounter: Payer: Self-pay | Admitting: Internal Medicine

## 2010-09-11 ENCOUNTER — Ambulatory Visit (INDEPENDENT_AMBULATORY_CARE_PROVIDER_SITE_OTHER): Payer: Medicare Other | Admitting: Internal Medicine

## 2010-09-11 DIAGNOSIS — D126 Benign neoplasm of colon, unspecified: Secondary | ICD-10-CM

## 2010-09-11 DIAGNOSIS — I10 Essential (primary) hypertension: Secondary | ICD-10-CM

## 2010-09-11 DIAGNOSIS — K635 Polyp of colon: Secondary | ICD-10-CM

## 2010-09-11 DIAGNOSIS — F17200 Nicotine dependence, unspecified, uncomplicated: Secondary | ICD-10-CM

## 2010-09-11 DIAGNOSIS — E785 Hyperlipidemia, unspecified: Secondary | ICD-10-CM

## 2010-09-12 DIAGNOSIS — D1391 Familial adenomatous polyposis: Secondary | ICD-10-CM

## 2010-09-12 DIAGNOSIS — D126 Benign neoplasm of colon, unspecified: Secondary | ICD-10-CM

## 2010-09-12 HISTORY — DX: Familial adenomatous polyposis: D13.91

## 2010-09-12 HISTORY — DX: Benign neoplasm of colon, unspecified: D12.6

## 2010-09-12 LAB — HEPATIC FUNCTION PANEL
AST: 15 U/L (ref 0–37)
Bilirubin, Direct: <0.1 mg/dL (ref 0.0–0.3)
Total Bilirubin: 0.6 mg/dL (ref 0.3–1.2)

## 2010-09-12 LAB — LIPID PANEL
Total CHOL/HDL Ratio: 4.3 Ratio
VLDL: 34 mg/dL (ref 0–40)

## 2010-09-12 NOTE — Assessment & Plan Note (Signed)
Stable. Continue statin therapy. Obtain fasting lipid profile and liver function tests 

## 2010-09-12 NOTE — Assessment & Plan Note (Signed)
Schedule followup appointment with GI. Apparently was recommended for 2011 followup colonoscopy.

## 2010-09-12 NOTE — Assessment & Plan Note (Signed)
Recommended cessation. Patient not ready. Understands potential morbidity from continued tobacco use

## 2010-09-12 NOTE — Assessment & Plan Note (Signed)
Normotensive and stable. Continue current regimen. Monitor blood pressure as an outpatient and followup in clinic as scheduled. 

## 2010-09-12 NOTE — Progress Notes (Signed)
  Subjective:    Patient ID: Timothy Wood, male    DOB: 06-03-1940, 70 y.o.   MRN: [id number]  HPI Patient presents to clinic for evaluation of hypertension and hyperlipidemia. Tolerate statin therapy without myalgias or abnormal LFTs. Has history of PAD status post left stent placement. Denies claudication symptoms. Continues to smoke tobacco regularly and is not ready for cessation. Blood pressure reviewed as normotensive. Colonoscopy approximately 2000 on and states was recommended for followup approximately 2011 but has not performed subsequent colonoscopy. Denies abdominal pain, change in bowel habit or blood in stool. No complaints.  Reviewed past medical history, medications and allergies    Review of Systems see history of present illness     Objective:   Physical Exam    Physical Exam  Vitals reviewed. Constitutional:  appears well-developed and well-nourished. No distress.  HENT:  Head: Normocephalic and atraumatic.  Right Ear: Tympanic membrane, external ear and ear canal normal.  Left Ear: Tympanic membrane, external ear and ear canal normal.  Nose: Nose normal.  Mouth/Throat: Oropharynx is clear and moist. No oropharyngeal exudate.  Eyes: Conjunctivae and EOM are normal. Pupils are equal, round, and reactive to light. Right eye exhibits no discharge. Left eye exhibits no discharge. No scleral icterus.  Neck: Neck supple. No thyromegaly present.  Cardiovascular: Normal rate, regular rhythm and normal heart sounds.  Exam reveals no gallop and no friction rub.   No murmur heard. Pulmonary/Chest: Effort normal and breath sounds normal. No respiratory distress.  has no wheezes.  has no rales.  Lymphadenopathy:   no cervical adenopathy.  Neurological:  is alert.  Skin: Skin is warm and dry.  not diaphoretic.  Psychiatric: normal mood and affect.      Assessment & Plan:

## 2010-10-15 ENCOUNTER — Other Ambulatory Visit: Payer: Self-pay | Admitting: Internal Medicine

## 2010-10-16 NOTE — Telephone Encounter (Signed)
Refill sent to pharmacy.   

## 2010-10-16 NOTE — Telephone Encounter (Signed)
Ok with rf3 

## 2010-10-16 NOTE — Telephone Encounter (Signed)
Pt last seen 09/11/10 and recommended f/u in December. Requesting refill of Flexeril. Last filled 07/17/10. Please advise if ok to refill and # of refills.

## 2010-10-20 ENCOUNTER — Ambulatory Visit (INDEPENDENT_AMBULATORY_CARE_PROVIDER_SITE_OTHER): Payer: Medicare Other | Admitting: Internal Medicine

## 2010-10-20 ENCOUNTER — Encounter: Payer: Self-pay | Admitting: Internal Medicine

## 2010-10-20 VITALS — BP 100/70 | HR 72 | Ht 69.0 in | Wt 180.2 lb

## 2010-10-20 DIAGNOSIS — I739 Peripheral vascular disease, unspecified: Secondary | ICD-10-CM

## 2010-10-20 DIAGNOSIS — K635 Polyp of colon: Secondary | ICD-10-CM

## 2010-10-20 DIAGNOSIS — Z860100 Personal history of colon polyps, unspecified: Secondary | ICD-10-CM

## 2010-10-20 DIAGNOSIS — Z1211 Encounter for screening for malignant neoplasm of colon: Secondary | ICD-10-CM

## 2010-10-20 DIAGNOSIS — Z7902 Long term (current) use of antithrombotics/antiplatelets: Secondary | ICD-10-CM

## 2010-10-20 DIAGNOSIS — D126 Benign neoplasm of colon, unspecified: Secondary | ICD-10-CM

## 2010-10-20 MED ORDER — PEG-KCL-NACL-NASULF-NA ASC-C 100 G PO SOLR: 1.0000 | Freq: Once | ORAL | Status: DC

## 2010-10-20 NOTE — Patient Instructions (Signed)
You have been scheduled for a Colonoscopy with separate instructions given. Your prep kit has been sent to your pharmacy for you to pick up. You will be contacted by our office prior to your procedure for directions on holding your Plavix.  If you do not hear from our office 1 week prior to your scheduled procedure, please call [phone] to discuss.  

## 2010-10-21 ENCOUNTER — Other Ambulatory Visit: Payer: Medicare Other | Admitting: Internal Medicine

## 2010-10-21 ENCOUNTER — Encounter: Payer: Self-pay | Admitting: Internal Medicine

## 2010-10-21 NOTE — Assessment & Plan Note (Signed)
Doing well - on Plavix for this Best if held 5-7 days before colonoscopy Will ask Dr. Myra Gianotti about this

## 2010-10-21 NOTE — Assessment & Plan Note (Addendum)
Large cecal adenoma - ? If completely removed. Residual polyp removed ablated with APC last in 2/09 Will schedule for repeat colonoscopy (screening and hx polyps) anticipating holding Plavix 5-7 days if ok with Dr. Myra Gianotti of vascular surgery.

## 2010-10-21 NOTE — Progress Notes (Signed)
  Subjective:    Patient ID: Timothy Wood, male    DOB: 1940/04/26, 70 y.o.   MRN: [id number]  HPIHere to arrange a colonoscopy - letter sent 2011. Is on Plavix for PVD issues - iliac and aortic stents. No significant GI sxs. Has history of multiple adenomas, notably 2.5 cm cecal adenoma difficult to eradicate, last treated 04/2007.    Review of Systems Decreased hearing, back pain otherwise all negative    Objective:   Physical Exam WDWN NAD Lungs clear Heart s1s2 no rmg abd soft NT       Assessment & Plan:

## 2010-11-18 ENCOUNTER — Telehealth: Payer: Self-pay | Admitting: Internal Medicine

## 2010-11-20 ENCOUNTER — Telehealth: Payer: Self-pay | Admitting: Gastroenterology

## 2010-11-20 ENCOUNTER — Telehealth: Payer: Self-pay

## 2010-11-20 NOTE — Telephone Encounter (Signed)
Message copied by Bernita Buffy on Thu Nov 20, 2010  4:52 PM ------      Message from: Kale Rondeau, [place] A      Created: Mon Oct 20, 2010 12:17 PM       Make sure letter about Plavix was received from Dr. Myra Gianotti ok'ing patient to stop med. Call patient.

## 2010-11-20 NOTE — Telephone Encounter (Signed)
Rec'd fax request from Dr. Marvell Fuller office @ [facility] for Dr. Myra Gianotti to advise if pt. Can hold Plavix prior to GI procedure scheduled on 11/24/10.  Discussed w/ Dr. Myra Gianotti / pt. Has hx of Left CIA stent 6/10, and distal abd. Aorta stent 2/12.  Per Dr. Myra Gianotti: "from vascular surgery standpoint, pt. may hold Plavix prior to GI procedure".  Call placed to pt. To check if he had any cardiac procedures that the Plavix had been ordered for.  Pt. stated he was placed on Plavix per Dr. Myra Gianotti after procedure in February, and denied having had any cardiac procedure.

## 2010-11-20 NOTE — Telephone Encounter (Signed)
Received a letter from Dr. Estanislado Spire office stating its ok for the patient to stop Plavix 5-7 days prior to procedure.

## 2010-11-20 NOTE — Telephone Encounter (Signed)
Already talked to the patient concerning this

## 2010-11-24 ENCOUNTER — Ambulatory Visit (AMBULATORY_SURGERY_CENTER): Payer: Medicare Other | Admitting: Internal Medicine

## 2010-11-24 ENCOUNTER — Encounter: Payer: Self-pay | Admitting: Internal Medicine

## 2010-11-24 VITALS — BP 126/69 | HR 67 | Temp 96.8°F | Resp 16 | Ht 69.0 in | Wt 180.0 lb

## 2010-11-24 DIAGNOSIS — K5730 Diverticulosis of large intestine without perforation or abscess without bleeding: Secondary | ICD-10-CM

## 2010-11-24 DIAGNOSIS — K625 Hemorrhage of anus and rectum: Secondary | ICD-10-CM

## 2010-11-24 DIAGNOSIS — D126 Benign neoplasm of colon, unspecified: Secondary | ICD-10-CM

## 2010-11-24 DIAGNOSIS — Z86010 Personal history of colonic polyps: Secondary | ICD-10-CM

## 2010-11-24 DIAGNOSIS — Z1211 Encounter for screening for malignant neoplasm of colon: Secondary | ICD-10-CM

## 2010-11-24 MED ORDER — SODIUM CHLORIDE 0.9 % IV SOLN: 500.0000 mL | INTRAVENOUS | Status: DC

## 2010-11-24 MED ORDER — CLOPIDOGREL BISULFATE 75 MG PO TABS: 75.0000 mg | ORAL_TABLET | Freq: Every day | ORAL | Status: DC

## 2010-11-24 MED ORDER — ASPIRIN 325 MG PO TABS: 325.0000 mg | ORAL_TABLET | Freq: Every day | ORAL | Status: DC

## 2010-11-24 NOTE — Patient Instructions (Addendum)
Twelve polyps were removed today. The  Polyps appear benign. I do think one of them was growing back at the site of very large polyp removed before.  You also still have diverticulosis. Start back on Plavix tomorrow 9/11, resume your aspirin in 2 weeks - 9/25. No other anti-inflammatory medications (like ibuprofen, aleve) until 9/25 also. I will send you a letter with results and recommendations. You prostate seemed slightly abnormal and should be reassessed by primary care or a urologist. Iva Boop, MD, Ilsa Iha and [facility] discharge instructions reviewed with patient and care partner.  Handouts given for polyps, diverticulosis and high fiber diet.

## 2010-11-25 ENCOUNTER — Telehealth: Payer: Self-pay | Admitting: *Deleted

## 2010-11-25 NOTE — Telephone Encounter (Signed)

## 2010-11-28 ENCOUNTER — Encounter: Payer: Self-pay | Admitting: Internal Medicine

## 2010-11-28 NOTE — Progress Notes (Signed)
Quick Note:  12 adenomas - repeat colonoscopy 6-12 months Will ask him to consider genetic testing ______

## 2010-12-04 ENCOUNTER — Encounter (INDEPENDENT_AMBULATORY_CARE_PROVIDER_SITE_OTHER): Payer: Medicare Other | Admitting: Vascular Surgery

## 2010-12-04 ENCOUNTER — Other Ambulatory Visit (INDEPENDENT_AMBULATORY_CARE_PROVIDER_SITE_OTHER): Payer: Medicare Other | Admitting: Vascular Surgery

## 2010-12-04 DIAGNOSIS — Z48812 Encounter for surgical aftercare following surgery on the circulatory system: Secondary | ICD-10-CM

## 2010-12-04 DIAGNOSIS — I739 Peripheral vascular disease, unspecified: Secondary | ICD-10-CM

## 2010-12-10 NOTE — Procedures (Unsigned)
AORTA-ILIAC DUPLEX EVALUATION  INDICATION:  Followup peripheral vascular disease.  HISTORY: Diabetes:  No. Cardiac:  No. Hypertension:  Yes. Smoking:  Currently. Previous Surgery:  Left common iliac artery stent on 09/12/2010; distal aorta stent placed on 04/29/2010.              SINGLE LEVEL ARTERIAL EXAM                             RIGHT                  LEFT Brachial: Anterior tibial: Posterior tibial: Peroneal: Ankle/brachial index: Previous ABI/date:         06/03/2010, 1.0        06/03/2010, 1.06  AORTA-ILIAC DUPLEX EXAM Aorta - Proximal     86 cm/s Aorta - Mid          108 cm/s Aorta - Distal       265 cm/s - stent  RIGHT                                   LEFT                   CIA-PROXIMAL          287 cm/s                   CIA-DISTAL            161 cm/s                   HYPOGASTRIC           164 cm/s                   EIA-PROXIMAL          164 cm/s                   EIA-MID               114 cm/s                   EIA-DISTAL            125 cm/s  IMPRESSION: 1. Distal aortic stent presents with elevated velocity of 265 cm/s     which may be suggestive of stenosis and a ratio of 2.45. 2. Left common iliac artery stent presents with elevated velocity of     287 cm/s which may suggest stenosis, however may also be     overestimated due to distal aortic stent placement and disease     process. 3. Right ankle-brachial index is 1.21 and left ankle-brachial index is     1.25, bilaterally multiphasic Doppler waveforms and normal indices     present. 4.  ___________________________________________ Quita Skye. [name], M.D.  SH/MEDQ  D:  12/04/2010  T:  12/04/2010  Job:  [id number]

## 2010-12-11 ENCOUNTER — Encounter: Payer: Self-pay | Admitting: Vascular Surgery

## 2010-12-18 ENCOUNTER — Other Ambulatory Visit: Payer: Self-pay | Admitting: Vascular Surgery

## 2010-12-18 DIAGNOSIS — Z48812 Encounter for surgical aftercare following surgery on the circulatory system: Secondary | ICD-10-CM

## 2010-12-18 DIAGNOSIS — I739 Peripheral vascular disease, unspecified: Secondary | ICD-10-CM

## 2011-03-04 ENCOUNTER — Ambulatory Visit: Payer: Medicare Other | Admitting: Internal Medicine

## 2011-03-06 ENCOUNTER — Ambulatory Visit (INDEPENDENT_AMBULATORY_CARE_PROVIDER_SITE_OTHER): Payer: Medicare Other | Admitting: Internal Medicine

## 2011-03-06 ENCOUNTER — Encounter: Payer: Self-pay | Admitting: Internal Medicine

## 2011-03-06 DIAGNOSIS — F17200 Nicotine dependence, unspecified, uncomplicated: Secondary | ICD-10-CM

## 2011-03-06 DIAGNOSIS — E785 Hyperlipidemia, unspecified: Secondary | ICD-10-CM

## 2011-03-06 DIAGNOSIS — I10 Essential (primary) hypertension: Secondary | ICD-10-CM

## 2011-03-06 MED ORDER — ROSUVASTATIN CALCIUM 40 MG PO TABS: 40.0000 mg | ORAL_TABLET | Freq: Every day | ORAL | Status: DC

## 2011-03-06 MED ORDER — AMLODIPINE BESYLATE 5 MG PO TABS: 5.0000 mg | ORAL_TABLET | Freq: Every day | ORAL | Status: DC

## 2011-03-06 MED ORDER — CLOPIDOGREL BISULFATE 75 MG PO TABS: 75.0000 mg | ORAL_TABLET | Freq: Every day | ORAL | Status: DC

## 2011-03-06 MED ORDER — IRBESARTAN-HYDROCHLOROTHIAZIDE 150-12.5 MG PO TABS: 1.0000 | ORAL_TABLET | Freq: Every day | ORAL | Status: DC

## 2011-03-06 NOTE — Patient Instructions (Addendum)
Please complete the following lab tests before your next follow up appointment: BMET - 401.9 Lipid panel, LFTs - 272.4 PSA - v76.44

## 2011-03-06 NOTE — Assessment & Plan Note (Signed)
He tried patches in the past but was unsuccessful.  Pt advised to try nicotine lozenges.

## 2011-03-06 NOTE — Assessment & Plan Note (Signed)
Monitor lipids and LFTs yearly.  Continue Crestor 40 mg daily.

## 2011-03-06 NOTE — Progress Notes (Signed)
Subjective:    Patient ID: Timothy Wood, male    DOB: 1940-07-26, 70 y.o.   MRN: [id number]  HPI  70 year old white male with history of peripheral vascular disease, hyperlipidemia, hypertension and tobacco abuse her routine followup. Overall patient has been doing very well. He has had followup with vascular specialists in September of 2012. His ABIs were normal.  He has been compliant with blood pressure and cholesterol medication. He has reduced his tobacco use to 12 cigarettes per day but has not been able to quit. He is open to trying additional measures.  Review of Systems Negative for chest pain.  Negative for shortness of breath  Past Medical History  Diagnosis Date  . Hypertension   . Tobacco abuse   . Pulmonary nodule 06/2006    9 mm right upper obe pulmonary nodule (negative Pet Scan)  . Personal history of colonic polyps 2008    multiple adenomas 2008-2009 exams with large cecal adenoma  . Hyperlipidemia   . PVD (peripheral vascular disease)     left leg s/p stent  . Seborrheic keratosis   . Sebaceous cyst     neck  . Sinusitis   . Blood clotting disorder   . Diverticulosis   . Chronic obstructive pulmonary disease (COPD)   . GERD (gastroesophageal reflux disease)     History   Social History  . Marital Status: Married    Spouse Name: N/A    Number of Children: 2  . Years of Education: N/A   Occupational History  . Retired    Social History Main Topics  . Smoking status: Current Everyday Smoker    Types: Cigarettes  . Smokeless tobacco: Never Used   Comment: 50 pack year history  . Alcohol Use: No  . Drug Use: No  . Sexually Active: Not on file   Other Topics Concern  . Not on file   Social History Narrative   The patient is happily married.  He lives with his wife.  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  Occasional alcohol.   Tobacco use - over 50 pack years.Father died at age 44 of suicide.    Past Surgical History  Procedure Date    . Back surgery 1975, 1978    x2 lumbar fusion over 30 years ago  . Knee surgery 2002    left  . Tonsillectomy 1956  . Angioplasty / stenting iliac 08/2008    Dr. Myra Gianotti  . Descending aortic aneurysm repair w/ stent 04/2010    Dr. Myra Gianotti  . Colonoscopy 05/11/2006; 07/27/2006;05/05/2007, 11/24/2010    adenomas with 9 polyps index and 2.5 cm cecal polyp removed over 3 exams, last 2009 with APC. diverticulosis also.  2012: 12 adenomas removed, largest 8-10 mm, diverticulosis  . Esophagogastroduodenoscopy 05/05/2007    GERD - one erosion    Family History  Problem Relation Age of Onset  . Colon cancer Mother 38  . Heart disease Brother   . Diabetes      No Known Allergies  Current Outpatient Prescriptions on File Prior to Visit  Medication Sig Dispense Refill  . aspirin 325 MG tablet Take 1 tablet (325 mg total) by mouth daily. DO NOT TAKE AGAIN UNTIL 11/29/10      . cyclobenzaprine (FLEXERIL) 5 MG tablet TAKE ONE TABLET BY MOUTH TWICE DAILY  60 tablet  3  . omeprazole (PRILOSEC) 20 MG capsule Take 20 mg by mouth 2 (two) times daily.  BP 124/74  Pulse 80  Temp(Src) 97.8 F (36.6 C) (Oral)  Ht 5\' 9"  (1.753 m)  Wt 178 lb (80.74 kg)  BMI 26.29 kg/m2        Objective:   Physical Exam  Constitutional: He is oriented to person, place, and time. He appears well-developed and well-nourished. No distress.  Neck: Neck supple.  Cardiovascular: Normal rate, regular rhythm and normal heart sounds.   Pulmonary/Chest: No respiratory distress. He has no wheezes.  Musculoskeletal: He exhibits no edema.  Lymphadenopathy:    He has no cervical adenopathy.  Neurological: He is alert and oriented to person, place, and time.  Skin: Skin is warm and dry.  Psychiatric: He has a normal mood and affect. His behavior is normal.      Assessment & Plan:

## 2011-03-06 NOTE — Assessment & Plan Note (Signed)
Well controlled.  Continue current medication regimen.. BP: 124/74 mmHg  Lab Results  Component Value Date   CREATININE 1.0 04/29/2010

## 2011-04-29 ENCOUNTER — Other Ambulatory Visit: Payer: Self-pay | Admitting: *Deleted

## 2011-04-29 MED ORDER — OMEPRAZOLE 20 MG PO CPDR: 20.0000 mg | DELAYED_RELEASE_CAPSULE | Freq: Two times a day (BID) | ORAL | Status: DC

## 2011-05-12 ENCOUNTER — Encounter: Payer: Self-pay | Admitting: Internal Medicine

## 2011-06-08 ENCOUNTER — Other Ambulatory Visit (INDEPENDENT_AMBULATORY_CARE_PROVIDER_SITE_OTHER): Payer: Medicare Other | Admitting: *Deleted

## 2011-06-08 ENCOUNTER — Encounter (INDEPENDENT_AMBULATORY_CARE_PROVIDER_SITE_OTHER): Payer: Medicare Other | Admitting: *Deleted

## 2011-06-08 DIAGNOSIS — I739 Peripheral vascular disease, unspecified: Secondary | ICD-10-CM

## 2011-06-08 DIAGNOSIS — Z48812 Encounter for surgical aftercare following surgery on the circulatory system: Secondary | ICD-10-CM

## 2011-06-09 ENCOUNTER — Other Ambulatory Visit: Payer: Medicare Other

## 2011-06-16 NOTE — Procedures (Unsigned)
AORTA-ILIAC DUPLEX EVALUATION  INDICATION:  Follow up peripheral vascular disease.  HISTORY: Diabetes:  No. Cardiac:  No. Hypertension:  Yes. Smoking:  Yes. Previous Surgery:  Left common iliac artery stent on 09/12/2010; distal aortic stent placed on 04/29/2010.              SINGLE LEVEL ARTERIAL EXAM                             RIGHT                  LEFT Brachial: Anterior tibial: Posterior tibial: Peroneal: Ankle/brachial index: 06/08/11                    1.15 1.19 Previous ABI/date: 12/04/10                       1.21 1.25  AORTA-ILIAC DUPLEX EXAM Aorta - Proximal Aorta - Mid Aorta - Distal       73 cm/s  RIGHT                                   LEFT                   CIA-PROXIMAL                   CIA-DISTAL                   HYPOGASTRIC                   EIA-PROXIMAL                   EIA-MID                   EIA-DISTAL  IMPRESSION: 1. Distal aorta stent presents with an elevated velocity of 238 cm/s. 2. Left common iliac stent presents with an elevated velocity of 306     cm/s. 3. No significant change since prior study of 12/04/2010. 4. Ankle brachial indices within normal range with triphasic waveforms     bilaterally.  ___________________________________________ Quita Skye [name], M.D.  SS/MEDQ  D:  06/08/2011  T:  06/08/2011  Job:  [id number]

## 2011-08-28 ENCOUNTER — Other Ambulatory Visit (INDEPENDENT_AMBULATORY_CARE_PROVIDER_SITE_OTHER): Payer: Medicare Other

## 2011-08-28 DIAGNOSIS — Z125 Encounter for screening for malignant neoplasm of prostate: Secondary | ICD-10-CM

## 2011-08-28 DIAGNOSIS — I10 Essential (primary) hypertension: Secondary | ICD-10-CM

## 2011-08-28 DIAGNOSIS — E785 Hyperlipidemia, unspecified: Secondary | ICD-10-CM

## 2011-08-28 LAB — BASIC METABOLIC PANEL
BUN: 18 mg/dL (ref 6–23)
Calcium: 9.2 mg/dL (ref 8.4–10.5)
Creatinine, Ser: 1 mg/dL (ref 0.4–1.5)
GFR: 80.26 mL/min (ref >60.00)
Glucose, Bld: 93 mg/dL (ref 70–99)

## 2011-08-28 LAB — HEPATIC FUNCTION PANEL
ALT: 19 U/L (ref 0–53)
AST: 12 U/L (ref 0–37)
Bilirubin, Direct: 0.1 mg/dL (ref 0.0–0.3)
Total Bilirubin: 0.7 mg/dL (ref 0.3–1.2)

## 2011-09-04 ENCOUNTER — Ambulatory Visit (INDEPENDENT_AMBULATORY_CARE_PROVIDER_SITE_OTHER): Payer: Medicare Other | Admitting: Internal Medicine

## 2011-09-04 ENCOUNTER — Encounter: Payer: Self-pay | Admitting: Internal Medicine

## 2011-09-04 VITALS — BP 116/72 | HR 76 | Temp 98.1°F | Ht 69.0 in | Wt 181.0 lb

## 2011-09-04 DIAGNOSIS — E785 Hyperlipidemia, unspecified: Secondary | ICD-10-CM

## 2011-09-04 DIAGNOSIS — I10 Essential (primary) hypertension: Secondary | ICD-10-CM

## 2011-09-04 DIAGNOSIS — L821 Other seborrheic keratosis: Secondary | ICD-10-CM

## 2011-09-04 DIAGNOSIS — I739 Peripheral vascular disease, unspecified: Secondary | ICD-10-CM

## 2011-09-04 MED ORDER — CLOPIDOGREL BISULFATE 75 MG PO TABS: 75.0000 mg | ORAL_TABLET | Freq: Every day | ORAL | Status: DC

## 2011-09-04 MED ORDER — IRBESARTAN-HYDROCHLOROTHIAZIDE 150-12.5 MG PO TABS: 1.0000 | ORAL_TABLET | Freq: Every day | ORAL | Status: DC

## 2011-09-04 MED ORDER — CYCLOBENZAPRINE HCL 5 MG PO TABS: 5.0000 mg | ORAL_TABLET | Freq: Two times a day (BID) | ORAL | Status: DC | PRN

## 2011-09-04 MED ORDER — AMLODIPINE BESYLATE 5 MG PO TABS: 5.0000 mg | ORAL_TABLET | Freq: Every day | ORAL | Status: DC

## 2011-09-04 MED ORDER — ROSUVASTATIN CALCIUM 40 MG PO TABS: 40.0000 mg | ORAL_TABLET | Freq: Every day | ORAL | Status: DC

## 2011-09-04 NOTE — Assessment & Plan Note (Signed)
Well controlled.  No change in medication. BP: 116/72 mmHg  Lab Results  Component Value Date   CREATININE 1.0 08/28/2011

## 2011-09-04 NOTE — Assessment & Plan Note (Signed)
He has small scaly area on left ear log.  Applied liquid nitrogen.  Reassess in 2 months.

## 2011-09-04 NOTE — Assessment & Plan Note (Signed)
No symptom of claudication.  Palpable lower extremity pulses. Patient does report episode of possibly blacking out when he hyperextended his neck. We discussed the possibility of vascular disease of his vertebrobasilar system. Patient advised to avoid hyperextension of his neck. If symptoms get worse, we discussed obtaining an MRA of neck.  Continue ASA, Plavix

## 2011-09-04 NOTE — Progress Notes (Signed)
Subjective:    Patient ID: Timothy Wood, male    DOB: Jun 17, 1940, 71 y.o.   MRN: [id number]  HPI  71 year old white male with history of hypertension, hyperlipidemia and peripheral vascular disease for routine followup. Overall patient has been doing very well. He denies any claudication symptoms. He has been compliant with his medications. He does note episodes of dizziness when he was working underneath a sink on a plumbing problem. He felt he was going to blackout when he hyperextended his neck.  He also complains of scaly area on the outer edge of left ear.   Review of Systems Negative for chest pain.  No change in tobacco use.  He is not interested in smoking cessation  Past Medical History  Diagnosis Date  . Hypertension   . Tobacco abuse   . Pulmonary nodule 06/2006    9 mm right upper obe pulmonary nodule (negative Pet Scan)  . Personal history of colonic polyps 2008    multiple adenomas 2008-2009 exams with large cecal adenoma  . Hyperlipidemia   . PVD (peripheral vascular disease)     left leg s/p stent  . Seborrheic keratosis   . Sebaceous cyst     neck  . Sinusitis   . Blood clotting disorder   . Diverticulosis   . Chronic obstructive pulmonary disease (COPD)   . GERD (gastroesophageal reflux disease)     History   Social History  . Marital Status: Married    Spouse Name: N/A    Number of Children: 2  . Years of Education: N/A   Occupational History  . Retired    Social History Main Topics  . Smoking status: Current Everyday Smoker    Types: Cigarettes  . Smokeless tobacco: Never Used   Comment: 50 pack year history  . Alcohol Use: No  . Drug Use: No  . Sexually Active: Not on file   Other Topics Concern  . Not on file   Social History Narrative   The patient is happily married.  He lives with his wife.  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  Occasional alcohol.   Tobacco use - over 50 pack years.Father died at age 63 of suicide.     Past Surgical History  Procedure Date  . Back surgery 1975, 1978    x2 lumbar fusion over 30 years ago  . Knee surgery 2002    left  . Tonsillectomy 1956  . Angioplasty / stenting iliac 08/2008    Dr. Myra Gianotti  . Descending aortic aneurysm repair w/ stent 04/2010    Dr. Myra Gianotti  . Colonoscopy 05/11/2006; 07/27/2006;05/05/2007, 11/24/2010    adenomas with 9 polyps index and 2.5 cm cecal polyp removed over 3 exams, last 2009 with APC. diverticulosis also.  2012: 12 adenomas removed, largest 8-10 mm, diverticulosis  . Esophagogastroduodenoscopy 05/05/2007    GERD - one erosion    Family History  Problem Relation Age of Onset  . Colon cancer Mother 32  . Heart disease Brother   . Diabetes      No Known Allergies  Current Outpatient Prescriptions on File Prior to Visit  Medication Sig Dispense Refill  . aspirin 325 MG tablet Take 1 tablet (325 mg total) by mouth daily. DO NOT TAKE AGAIN UNTIL 11/29/10      . DISCONTD: amLODipine (NORVASC) 5 MG tablet Take 1 tablet (5 mg total) by mouth daily.  90 tablet  1  . DISCONTD: irbesartan-hydrochlorothiazide (AVALIDE) 150-12.5 MG per tablet Take  1 tablet by mouth daily.  90 tablet  1  . DISCONTD: omeprazole (PRILOSEC) 20 MG capsule Take 1 capsule (20 mg total) by mouth 2 (two) times daily.  60 capsule  5  . DISCONTD: rosuvastatin (CRESTOR) 40 MG tablet Take 1 tablet (40 mg total) by mouth daily.  90 tablet  1    BP 116/72  Pulse 76  Temp 98.1 F (36.7 C) (Oral)  Ht 5\' 9"  (1.753 m)  Wt 181 lb (82.101 kg)  BMI 26.73 kg/m2       Objective:   Physical Exam  Constitutional: He is oriented to person, place, and time. He appears well-developed and well-nourished.  Cardiovascular: Normal rate, regular rhythm and normal heart sounds.        Diminished pedis dorsalis pulse bilaterally,  Palpable posterior tibial pulse bilaterally  Pulmonary/Chest: Effort normal and breath sounds normal. He has no wheezes.  Musculoskeletal:       Trace lower  extremity edema bilaterally  Neurological: He is alert and oriented to person, place, and time. No cranial nerve deficit.  Skin: Skin is warm and dry.       4-5 mm scaly lesion left ear lobe  Psychiatric: He has a normal mood and affect. His behavior is normal.        Assessment & Plan:

## 2011-09-04 NOTE — Assessment & Plan Note (Signed)
LDL at goal.  Continue crestor. Lab Results  Component Value Date   CHOL 120 08/28/2011   HDL 31.50* 08/28/2011   LDLCALC 55 08/28/2011   LDLDIRECT 118.8 06/14/2006   TRIG 167.0* 08/28/2011   CHOLHDL 4 08/28/2011   Lab Results  Component Value Date   ALT 19 08/28/2011   AST 12 08/28/2011   ALKPHOS 66 08/28/2011   BILITOT 0.7 08/28/2011

## 2011-09-25 ENCOUNTER — Encounter (HOSPITAL_COMMUNITY): Payer: Self-pay

## 2011-09-25 ENCOUNTER — Emergency Department (HOSPITAL_COMMUNITY)
Admission: EM | Admit: 2011-09-25 | Discharge: 2011-09-25 | Disposition: A | Discharge status: Home / Self Care | Payer: Medicare Other | Source: Home / Self Care

## 2011-09-25 ENCOUNTER — Emergency Department (INDEPENDENT_AMBULATORY_CARE_PROVIDER_SITE_OTHER): Payer: Medicare Other

## 2011-09-25 DIAGNOSIS — S61209A Unspecified open wound of unspecified finger without damage to nail, initial encounter: Secondary | ICD-10-CM

## 2011-09-25 DIAGNOSIS — S61219A Laceration without foreign body of unspecified finger without damage to nail, initial encounter: Secondary | ICD-10-CM

## 2011-09-25 MED ORDER — TETANUS-DIPHTHERIA TOXOIDS TD 5-2 LFU IM INJ
0.5000 mL | INJECTION | Freq: Once | INTRAMUSCULAR | Status: AC
  Administered 2011-09-25: 0.5 mL via INTRAMUSCULAR

## 2011-09-25 MED ORDER — TETANUS-DIPHTHERIA TOXOIDS TD 5-2 LFU IM INJ
INJECTION | INTRAMUSCULAR | Status: AC
  Filled 2011-09-25: qty 0.5

## 2011-09-25 MED ORDER — LIDOCAINE-EPINEPHRINE 2 %-1:100000 IJ SOLN: 5.0000 mL | Freq: Once | INTRAMUSCULAR | Status: DC

## 2011-09-25 MED ORDER — TETANUS-DIPHTH-ACELL PERTUSSIS 5-2.5-18.5 LF-MCG/0.5 IM SUSP: 0.5000 mL | Freq: Once | INTRAMUSCULAR | Status: DC

## 2011-09-25 MED ORDER — BACITRACIN 500 UNIT/GM EX OINT: 1.0000 "application " | TOPICAL_OINTMENT | Freq: Once | CUTANEOUS | Status: DC

## 2011-09-25 NOTE — ED Notes (Signed)
Reached to grab a hedge trimmed he had dropped , sustained injury to 5th finger, minimal bleeding at present

## 2011-09-25 NOTE — ED Provider Notes (Signed)
I saw, evaluated and examined the patient, right-handed male  with laceration lateral aspect of left little finger. No evidence of tendon, vascular involvement, no foreign bodies noted. Updated tetanus. Assisted resident with digital block. Repair as noted in resident's note. Agree present plan.  Luiz Blare MD   Luiz Blare, MD 09/25/11 2056

## 2011-09-25 NOTE — ED Provider Notes (Signed)
History     CSN: [id number]  Arrival date & time 09/25/11  1447   First MD Initiated Contact with Patient 09/25/11 1451      Chief Complaint  Patient presents with  . Hand Injury    HPI  Patient presents with a finger laceration, lateral aspect of LEFT little finger.  Injury occurred about one hour ago.  Patient was cutting hedges with a hedge trimmer.  He lost his balance and jammed his finger into the blade.  Patient washed off finger with water, but laceration continues to to bleed.  He takes Plavix 75 mg and Aspirin daily.  Patient complains of finger soreness, but no severe pain or numbness/tingling sensation.    Denies any associated fever, chills, NS, nausea/vomiting.  Past Medical History  Diagnosis Date  . Hypertension   . Tobacco abuse   . Pulmonary nodule 06/2006    9 mm right upper obe pulmonary nodule (negative Pet Scan)  . Personal history of colonic polyps 2008    multiple adenomas 2008-2009 exams with large cecal adenoma  . Hyperlipidemia   . PVD (peripheral vascular disease)     left leg s/p stent  . Seborrheic keratosis   . Sebaceous cyst     neck  . Sinusitis   . Blood clotting disorder   . Diverticulosis   . Chronic obstructive pulmonary disease (COPD)   . GERD (gastroesophageal reflux disease)     Past Surgical History  Procedure Date  . Back surgery 1975, 1978    x2 lumbar fusion over 30 years ago  . Knee surgery 2002    left  . Tonsillectomy 1956  . Angioplasty / stenting iliac 08/2008    Dr. Myra Gianotti  . Descending aortic aneurysm repair w/ stent 04/2010    Dr. Myra Gianotti  . Colonoscopy 05/11/2006; 07/27/2006;05/05/2007, 11/24/2010    adenomas with 9 polyps index and 2.5 cm cecal polyp removed over 3 exams, last 2009 with APC. diverticulosis also.  2012: 12 adenomas removed, largest 8-10 mm, diverticulosis  . Esophagogastroduodenoscopy 05/05/2007    GERD - one erosion    Family History  Problem Relation Age of Onset  . Colon cancer Mother 48  .  Heart disease Brother   . Diabetes      History  Substance Use Topics  . Smoking status: Current Everyday Smoker    Types: Cigarettes  . Smokeless tobacco: Never Used   Comment: 50 pack year history  . Alcohol Use: No     Review of Systems  Per HPI  Allergies  Review of patient's allergies indicates no known allergies.  Home Medications   Current Outpatient Rx  Name Route Sig Dispense Refill  . AMLODIPINE BESYLATE 5 MG PO TABS Oral Take 1 tablet (5 mg total) by mouth daily. 90 tablet 1  . ASPIRIN 325 MG PO TABS Oral Take 1 tablet (325 mg total) by mouth daily. DO NOT TAKE AGAIN UNTIL 11/29/10    . CLOPIDOGREL BISULFATE 75 MG PO TABS Oral Take 1 tablet (75 mg total) by mouth daily. RESTART 11/25/10 90 tablet 1  . CYCLOBENZAPRINE HCL 5 MG PO TABS Oral Take 1 tablet (5 mg total) by mouth 2 (two) times daily as needed for muscle spasms. 60 tablet 3  . IRBESARTAN-HYDROCHLOROTHIAZIDE 150-12.5 MG PO TABS Oral Take 1 tablet by mouth daily. 90 tablet 1  . OMEPRAZOLE 20 MG PO CPDR Oral Take 20 mg by mouth daily.    [name] Kitchen ROSUVASTATIN CALCIUM 40 MG PO TABS Oral Take  1 tablet (40 mg total) by mouth daily. 90 tablet 1    BP 133/79  Pulse 107  Temp 98 F (36.7 C) (Oral)  Resp 20  SpO2 97%  Physical Exam  Constitutional: No distress.  Cardiovascular: Normal heart sounds.  Tachycardia present.   No murmur heard. Pulmonary/Chest: Effort normal and breath sounds normal. He has no wheezes. He has no rales.  Musculoskeletal:       2 cm long jagged, linear laceration of lateral aspect of LEFT 5th finger; actively bleeding  Hand, left: Normal extension DIP and PIP joints. Normal flexion of DIP and PIP joints. Normal 2 point pin-prick discrimination; no sensory deficit. 5/5 strength in all fingers.   ED Course  LACERATION REPAIR Date/Time: 09/25/2011 5:31 PM Performed by: DE LA CRUZ, IVY Authorized by: Tye Savoy, IVY Consent: Verbal consent obtained. Risks and benefits: risks,  benefits and alternatives were discussed Consent given by: patient Patient understanding: patient states understanding of the procedure being performed Patient consent: the patient's understanding of the procedure matches consent given Procedure consent: procedure consent matches procedure scheduled Relevant documents: relevant documents present and verified Test results: test results available and properly labeled Site marked: the operative site was marked Imaging studies: imaging studies available Patient identity confirmed: verbally with patient Time out: Immediately prior to procedure a "time out" was called to verify the correct patient, procedure, equipment, support staff and site/side marked as required. Body area: upper extremity Location details: left ring finger Laceration length: 3.5 cm Foreign bodies: no foreign bodies Tendon involvement: none Nerve involvement: none Vascular damage: no Anesthesia: digital block Local anesthetic: lidocaine 1% with epinephrine Patient sedated: no Preparation: Patient was prepped and draped in the usual sterile fashion. Irrigation solution: tap water Debridement: minimal Skin closure: 5-0 nylon Number of sutures: 6 Technique: simple Dressing: antibiotic ointment and pressure dressing Patient tolerance: Patient tolerated the procedure well with no immediate complications. Comments: Patient to follow up with PCP in 10 days for suture removal or sooner if develops signs of local or systemic infection.    Labs Reviewed - No data to display Dg Finger Little Left  09/25/2011  *RADIOLOGY REPORT*  Clinical Data: Cut left little finger  LEFT LITTLE FINGER 2+V  Comparison: None.  Findings: There is no evidence of fracture or foreign body of the left fifth digit.  Soft tissue injury noted laterally.  IMPRESSION: No fracture or foreign body.  Original Report Authenticated By: Genevive Bi, M.D.     1. Laceration of finger of left hand       MDM   Laceration repair, left 5th finger: Please refer to procedure note above.  Patient tolerated procedure well.  Advised patient to refrain from any gardening or operating of machinery until sutures are removed in about 10 days.  He was discharged in stable condition.  Red flags/signs of infection reviewed.       Barnabas Lister, MD 09/25/11 [phone]

## 2011-11-30 ENCOUNTER — Other Ambulatory Visit: Payer: Medicare Other

## 2012-01-12 ENCOUNTER — Encounter: Payer: Self-pay | Admitting: Internal Medicine

## 2012-01-20 ENCOUNTER — Encounter: Payer: Self-pay | Admitting: Vascular Surgery

## 2012-06-07 ENCOUNTER — Other Ambulatory Visit: Payer: Self-pay | Admitting: Internal Medicine

## 2012-06-24 ENCOUNTER — Emergency Department (HOSPITAL_COMMUNITY)
Admission: EM | Admit: 2012-06-24 | Discharge: 2012-06-24 | Disposition: A | Discharge status: Home / Self Care | Payer: Medicare Other | Attending: Emergency Medicine | Admitting: Emergency Medicine

## 2012-06-24 ENCOUNTER — Emergency Department (HOSPITAL_COMMUNITY): Payer: Medicare Other

## 2012-06-24 ENCOUNTER — Encounter (HOSPITAL_COMMUNITY): Payer: Self-pay | Admitting: Certified Registered Nurse Anesthetist

## 2012-06-24 ENCOUNTER — Encounter (HOSPITAL_COMMUNITY): Payer: Self-pay | Admitting: Neurology

## 2012-06-24 ENCOUNTER — Encounter (HOSPITAL_COMMUNITY)
Admission: EM | Disposition: A | Discharge status: Home / Self Care | Payer: Self-pay | Source: Home / Self Care | Attending: Emergency Medicine

## 2012-06-24 ENCOUNTER — Emergency Department (HOSPITAL_COMMUNITY): Payer: Medicare Other | Admitting: Certified Registered Nurse Anesthetist

## 2012-06-24 DIAGNOSIS — W312XXA Contact with powered woodworking and forming machines, initial encounter: Secondary | ICD-10-CM | POA: Insufficient documentation

## 2012-06-24 DIAGNOSIS — S62639B Displaced fracture of distal phalanx of unspecified finger, initial encounter for open fracture: Secondary | ICD-10-CM | POA: Insufficient documentation

## 2012-06-24 DIAGNOSIS — S61012A Laceration without foreign body of left thumb without damage to nail, initial encounter: Secondary | ICD-10-CM

## 2012-06-24 DIAGNOSIS — J4489 Other specified chronic obstructive pulmonary disease: Secondary | ICD-10-CM | POA: Insufficient documentation

## 2012-06-24 DIAGNOSIS — I739 Peripheral vascular disease, unspecified: Secondary | ICD-10-CM | POA: Insufficient documentation

## 2012-06-24 DIAGNOSIS — J449 Chronic obstructive pulmonary disease, unspecified: Secondary | ICD-10-CM | POA: Insufficient documentation

## 2012-06-24 DIAGNOSIS — R911 Solitary pulmonary nodule: Secondary | ICD-10-CM | POA: Insufficient documentation

## 2012-06-24 DIAGNOSIS — E785 Hyperlipidemia, unspecified: Secondary | ICD-10-CM | POA: Insufficient documentation

## 2012-06-24 DIAGNOSIS — I10 Essential (primary) hypertension: Secondary | ICD-10-CM | POA: Insufficient documentation

## 2012-06-24 DIAGNOSIS — F17200 Nicotine dependence, unspecified, uncomplicated: Secondary | ICD-10-CM | POA: Insufficient documentation

## 2012-06-24 DIAGNOSIS — S62522B Displaced fracture of distal phalanx of left thumb, initial encounter for open fracture: Secondary | ICD-10-CM

## 2012-06-24 DIAGNOSIS — K219 Gastro-esophageal reflux disease without esophagitis: Secondary | ICD-10-CM | POA: Insufficient documentation

## 2012-06-24 DIAGNOSIS — S61209A Unspecified open wound of unspecified finger without damage to nail, initial encounter: Secondary | ICD-10-CM | POA: Insufficient documentation

## 2012-06-24 HISTORY — PX: PERCUTANEOUS PINNING: SHX2209

## 2012-06-24 LAB — CBC
MCHC: 34.8 g/dL (ref 30.0–36.0)
MCV: 88.8 fL (ref 78.0–100.0)
Platelets: 239 10*3/uL (ref 150–400)
RDW: 13.6 % (ref 11.5–15.5)
WBC: 8.2 10*3/uL (ref 4.0–10.5)

## 2012-06-24 LAB — POCT I-STAT, CHEM 8
Calcium, Ion: 1.15 mmol/L (ref 1.13–1.30)
Chloride: 106 mEq/L (ref 96–112)
HCT: 49 % (ref 39.0–52.0)
Hemoglobin: 16.7 g/dL (ref 13.0–17.0)
Potassium: 4 mEq/L (ref 3.5–5.1)

## 2012-06-24 SURGERY — PINNING, EXTREMITY, PERCUTANEOUS
Anesthesia: General | Site: Hand | Laterality: Left | Wound class: Dirty or Infected

## 2012-06-24 MED ORDER — HYDROMORPHONE HCL PF 1 MG/ML IJ SOLN: 0.2500 mg | INTRAMUSCULAR | Status: DC | PRN

## 2012-06-24 MED ORDER — HYDROMORPHONE HCL PF 1 MG/ML IJ SOLN
1.0000 mg | Freq: Once | INTRAMUSCULAR | Status: AC
  Administered 2012-06-24: 1 mg via INTRAVENOUS
  Filled 2012-06-24: qty 1

## 2012-06-24 MED ORDER — LIDOCAINE HCL (CARDIAC) 20 MG/ML IV SOLN
INTRAVENOUS | Status: DC | PRN
  Administered 2012-06-24: 40 mg via INTRAVENOUS

## 2012-06-24 MED ORDER — FENTANYL CITRATE 0.05 MG/ML IJ SOLN
INTRAMUSCULAR | Status: DC | PRN
  Administered 2012-06-24: 150 ug via INTRAVENOUS

## 2012-06-24 MED ORDER — ROCURONIUM BROMIDE 100 MG/10ML IV SOLN
INTRAVENOUS | Status: DC | PRN
  Administered 2012-06-24: 40 mg via INTRAVENOUS

## 2012-06-24 MED ORDER — PHENYLEPHRINE HCL 10 MG/ML IJ SOLN
INTRAMUSCULAR | Status: DC | PRN
  Administered 2012-06-24: 80 ug via INTRAVENOUS
  Administered 2012-06-24: 120 ug via INTRAVENOUS
  Administered 2012-06-24: 80 ug via INTRAVENOUS
  Administered 2012-06-24: 120 ug via INTRAVENOUS

## 2012-06-24 MED ORDER — LACTATED RINGERS IV SOLN
INTRAVENOUS | Status: DC | PRN
  Administered 2012-06-24 (×2): via INTRAVENOUS

## 2012-06-24 MED ORDER — ONDANSETRON HCL 4 MG/2ML IJ SOLN: 4.0000 mg | Freq: Once | INTRAMUSCULAR | Status: DC | PRN

## 2012-06-24 MED ORDER — PROPOFOL 10 MG/ML IV BOLUS
INTRAVENOUS | Status: DC | PRN
  Administered 2012-06-24: 180 mg via INTRAVENOUS

## 2012-06-24 MED ORDER — PHENYLEPHRINE HCL 10 MG/ML IJ SOLN
10.0000 mg | INTRAVENOUS | Status: DC | PRN
  Administered 2012-06-24: 10 ug/min via INTRAVENOUS

## 2012-06-24 MED ORDER — GLYCOPYRROLATE 0.2 MG/ML IJ SOLN
INTRAMUSCULAR | Status: DC | PRN
  Administered 2012-06-24: .6 mg via INTRAVENOUS

## 2012-06-24 MED ORDER — NEOSTIGMINE METHYLSULFATE 1 MG/ML IJ SOLN
INTRAMUSCULAR | Status: DC | PRN
  Administered 2012-06-24: 4 mg via INTRAVENOUS

## 2012-06-24 MED ORDER — MIDAZOLAM HCL 5 MG/5ML IJ SOLN
INTRAMUSCULAR | Status: DC | PRN
  Administered 2012-06-24: 2 mg via INTRAVENOUS

## 2012-06-24 MED ORDER — ONDANSETRON HCL 4 MG/2ML IJ SOLN
INTRAMUSCULAR | Status: DC | PRN
  Administered 2012-06-24: 4 mg via INTRAVENOUS

## 2012-06-24 MED ORDER — 0.9 % SODIUM CHLORIDE (POUR BTL) OPTIME
TOPICAL | Status: DC | PRN
  Administered 2012-06-24: 1000 mL

## 2012-06-24 MED ORDER — ACETAMINOPHEN 10 MG/ML IV SOLN: 1000.0000 mg | Freq: Once | INTRAVENOUS | Status: DC | PRN

## 2012-06-24 MED ORDER — CEFAZOLIN SODIUM-DEXTROSE 2-3 GM-% IV SOLR
2.0000 g | Freq: Once | INTRAVENOUS | Status: AC
  Administered 2012-06-24: 2 g via INTRAVENOUS
  Filled 2012-06-24: qty 50

## 2012-06-24 MED ORDER — ONDANSETRON HCL 4 MG/2ML IJ SOLN
4.0000 mg | Freq: Once | INTRAMUSCULAR | Status: AC
  Administered 2012-06-24: 4 mg via INTRAVENOUS
  Filled 2012-06-24: qty 2

## 2012-06-24 SURGICAL SUPPLY — 42 items
APL SKNCLS STERI-STRIP NONHPOA (GAUZE/BANDAGES/DRESSINGS)
BANDAGE ELASTIC 3 VELCRO ST LF (GAUZE/BANDAGES/DRESSINGS) ×1 IMPLANT
BANDAGE ELASTIC 4 VELCRO ST LF (GAUZE/BANDAGES/DRESSINGS) IMPLANT
BANDAGE GAUZE ELAST BULKY 4 IN (GAUZE/BANDAGES/DRESSINGS) ×1 IMPLANT
BENZOIN TINCTURE PRP APPL 2/3 (GAUZE/BANDAGES/DRESSINGS) IMPLANT
BLADE SURG ROTATE 9660 (MISCELLANEOUS) IMPLANT
BNDG CMPR MD 5X2 ELC HKLP STRL (GAUZE/BANDAGES/DRESSINGS) ×1
BNDG ELASTIC 2 VLCR STRL LF (GAUZE/BANDAGES/DRESSINGS) ×1 IMPLANT
CLOTH BEACON ORANGE TIMEOUT ST (SAFETY) ×2 IMPLANT
COVER SURGICAL LIGHT HANDLE (MISCELLANEOUS) ×2 IMPLANT
CUFF TOURNIQUET SINGLE 18IN (TOURNIQUET CUFF) ×1 IMPLANT
CUFF TOURNIQUET SINGLE 24IN (TOURNIQUET CUFF) IMPLANT
DRSG EMULSION OIL 3X3 NADH (GAUZE/BANDAGES/DRESSINGS) IMPLANT
GAUZE XEROFORM 1X8 LF (GAUZE/BANDAGES/DRESSINGS) ×2 IMPLANT
GLOVE BIOGEL M STRL SZ7.5 (GLOVE) ×2 IMPLANT
GLOVE SS BIOGEL STRL SZ 8 (GLOVE) ×1 IMPLANT
GLOVE SUPERSENSE BIOGEL SZ 8 (GLOVE) ×1
GOWN STRL NON-REIN LRG LVL3 (GOWN DISPOSABLE) ×2 IMPLANT
GOWN STRL REIN XL XLG (GOWN DISPOSABLE) ×4 IMPLANT
K-WIRE .062 (WIRE) ×2
K-WIRE FX6X.062X2 END TROC (WIRE) ×1
KIT BASIN OR (CUSTOM PROCEDURE TRAY) ×2 IMPLANT
KIT ROOM TURNOVER OR (KITS) ×2 IMPLANT
KWIRE FX6X.062X2 END TROC (WIRE) IMPLANT
MANIFOLD NEPTUNE II (INSTRUMENTS) ×2 IMPLANT
NS IRRIG 1000ML POUR BTL (IV SOLUTION) ×2 IMPLANT
PACK ORTHO EXTREMITY (CUSTOM PROCEDURE TRAY) ×2 IMPLANT
PAD ARMBOARD 7.5X6 YLW CONV (MISCELLANEOUS) ×4 IMPLANT
PAD CAST 3X4 CTTN HI CHSV (CAST SUPPLIES) IMPLANT
PADDING CAST COTTON 3X4 STRL (CAST SUPPLIES) ×2
SPLINT FIBERGLASS 3X35 (CAST SUPPLIES) ×1 IMPLANT
SPONGE GAUZE 4X4 12PLY (GAUZE/BANDAGES/DRESSINGS) ×1 IMPLANT
STRIP CLOSURE SKIN 1/2X4 (GAUZE/BANDAGES/DRESSINGS) IMPLANT
SUT ETHIBOND 4 0 TF (SUTURE) ×1 IMPLANT
SUT ETHILON 4 0 P 3 18 (SUTURE) IMPLANT
SUT ETHILON 5 0 P 3 18 (SUTURE)
SUT ETHILON 5 0 PS 2 18 (SUTURE) ×1 IMPLANT
SUT NYLON ETHILON 5-0 P-3 1X18 (SUTURE) IMPLANT
SUT PROLENE 4 0 P 3 18 (SUTURE) IMPLANT
TOWEL OR 17X24 6PK STRL BLUE (TOWEL DISPOSABLE) ×2 IMPLANT
TOWEL OR 17X26 10 PK STRL BLUE (TOWEL DISPOSABLE) ×2 IMPLANT
WATER STERILE IRR 1000ML POUR (IV SOLUTION) ×2 IMPLANT

## 2012-06-24 NOTE — ED Notes (Signed)
Orthopedic surgery consult MD at bedside.

## 2012-06-24 NOTE — ED Notes (Signed)
Pt stating cut left thumb using table saw. Pt a x 4. No obvious amputation present. Cut extending from both sides of finger, U-shaped.

## 2012-06-24 NOTE — Op Note (Signed)
06/24/2012  8:10 PM  PATIENT:  Timothy Wood  72 y.o. male  PRE-OPERATIVE DIAGNOSIS:  Complex Laceration left thumb, open fracture of L thumb  POST-OPERATIVE DIAGNOSIS:  Complex Laceration left thumb,open fracture of L thumb  PROCEDURE:  Procedure(s): Exploration of wound, debridement o fskin, sub cutaneous tissue, bone;  Repair Complex Laceration Left Thumb, ORIF L distal phalanx with 0.62 K wire, repair distal insertion of flexor tendon  SURGEON:  Surgeon(s): Johnette Abraham, MD  ANESTHESIA:   general  SPECIMEN:  No Specimen  FINDINGS:  Near amputation of L thumb with open fracture, flexor tendon injury  DISPOSITION OF SPECIMEN:  {SPECIMEN DISPOSITION:[id number]  PATIENT DISPOSITION:  PACU - hemodynamically stable.

## 2012-06-24 NOTE — Transfer of Care (Signed)
Immediate Anesthesia Transfer of Care Note  Patient: Timothy Wood  Procedure(s) Performed: Procedure(s): Repair Complex Laceration Left Thumb with percutaneous pinning. (Left)  Patient Location: PACU  Anesthesia Type:General  Level of Consciousness: awake, sedated and patient cooperative  Airway & Oxygen Therapy: Patient Spontanous Breathing and Patient connected to nasal cannula oxygen  Post-op Assessment: Report given to PACU RN, Post -op Vital signs reviewed and stable and Patient moving all extremities X 4  Post vital signs: Reviewed and stable  Complications: No apparent anesthesia complications

## 2012-06-24 NOTE — H&P (Signed)
Reason for Consult:L thumb injury Referring Physician: ER  Timothy Wood is an 72 y.o. right handed male.  HPI: Pt cut thumb with saw, c/o pain, bleeding, inability to move distal phalanx  Past Medical History  Diagnosis Date  . Hypertension   . Tobacco abuse   . Pulmonary nodule 06/2006    9 mm right upper obe pulmonary nodule (negative Pet Scan)  . Personal history of colonic polyps 2008    multiple adenomas 2008-2009 exams with large cecal adenoma  . Hyperlipidemia   . PVD (peripheral vascular disease)     left leg s/p stent  . Seborrheic keratosis   . Sebaceous cyst     neck  . Sinusitis   . Blood clotting disorder   . Diverticulosis   . Chronic obstructive pulmonary disease (COPD)   . GERD (gastroesophageal reflux disease)     Past Surgical History  Procedure Laterality Date  . Back surgery  1975, 1978    x2 lumbar fusion over 30 years ago  . Knee surgery  2002    left  . Tonsillectomy  1956  . Angioplasty / stenting iliac  08/2008    Dr. Myra Gianotti  . Descending aortic aneurysm repair w/ stent  04/2010    Dr. Myra Gianotti  . Colonoscopy  05/11/2006; 07/27/2006;05/05/2007, 11/24/2010    adenomas with 9 polyps index and 2.5 cm cecal polyp removed over 3 exams, last 2009 with APC. diverticulosis also.  2012: 12 adenomas removed, largest 8-10 mm, diverticulosis  . Esophagogastroduodenoscopy  05/05/2007    GERD - one erosion    Family History  Problem Relation Age of Onset  . Colon cancer Mother 34  . Heart disease Brother   . Diabetes      Social History:  reports that he has been smoking Cigarettes.  He has been smoking about 0.00 packs per day. He has never used smokeless tobacco. He reports that he does not drink alcohol or use illicit drugs.  Allergies: No Known Allergies  Medications: I have reviewed the patient's current medications.  No results found for this or any previous visit (from the past 48 hour(s)).  Dg Finger Thumb Left  06/24/2012  *RADIOLOGY REPORT*   Clinical Data: Finger injury  LEFT THUMB 2+V  Comparison: None.  Findings: Three views of the left thumb submitted.  There is open comminuted fracture distal phalanx with associated soft tissue injury.  IMPRESSION: Open comminuted displaced fracture of distal phalanx left thumb with associated soft tissue injury.   Original Report Authenticated By: Natasha Mead, M.D.     Pertinent items are noted in HPI. Temp:  [97.2 F (36.2 C)] 97.2 F (36.2 C) (04/11 1559) Pulse Rate:  [90] 90 (04/11 1559) Resp:  [14] 14 (04/11 1559) BP: (132)/(72) 132/72 mmHg (04/11 1559) SpO2:  [96 %] 96 % (04/11 1559) General appearance: alert and cooperative Resp: clear to auscultation bilaterally Cardio: regular rate and rhythm GI: soft, non-tender; bowel sounds normal; no masses,  no organomegaly Extremities: extremities normal, atraumatic, no cyanosis or edema and edema near amputation of left thumb, inability to flex distal phalanx, sensation protective, distal cap refill 2-3 sec   Assessment/Plan: Near amputation of L thumb, open fx, possible tendon, nerve injury Plan: discussed in detail exploration of wound, repair of tendon, nerve, bone, including risks, loss of function , ? Amputation.  Timothy Wood 06/24/2012, 5:40 PM

## 2012-06-24 NOTE — Preoperative (Addendum)
Beta Blockers   Reason not to administer Beta Blockers:Not Applicable 

## 2012-06-24 NOTE — ED Provider Notes (Signed)
I saw and evaluated the patient, reviewed the resident's note and I agree with the findings and plan.  Patient seen by me. Patient status post left thumb injury involving the bone from a table salt. Seen by Dr. Izora Ribas from hand surgery he will be taking him to the operating room to fix the finger also based on exam there is flexor tendon injury as well.  Results for orders placed during the hospital encounter of 06/24/12  CBC      Result Value Range   WBC 8.2  4.0 - 10.5 K/uL   RBC 5.20  4.22 - 5.81 MIL/uL   Hemoglobin 16.1  13.0 - 17.0 g/dL   HCT 16.1  09.6 - 04.5 %   MCV 88.8  78.0 - 100.0 fL   MCH 31.0  26.0 - 34.0 pg   MCHC 34.8  30.0 - 36.0 g/dL   RDW 40.9  81.1 - 91.4 %   Platelets 239  150 - 400 K/uL  POCT I-STAT, CHEM 8      Result Value Range   Sodium 141  135 - 145 mEq/L   Potassium 4.0  3.5 - 5.1 mEq/L   Chloride 106  96 - 112 mEq/L   BUN 28 (*) 6 - 23 mg/dL   Creatinine, Ser 7.82  0.50 - 1.35 mg/dL   Glucose, Bld 956 (*) 70 - 99 mg/dL   Calcium, Ion 2.13  0.86 - 1.30 mmol/L   TCO2 23  0 - 100 mmol/L   Hemoglobin 16.7  13.0 - 17.0 g/dL   HCT 57.8  46.9 - 62.9 %   Dg Finger Thumb Left  06/24/2012  *RADIOLOGY REPORT*  Clinical Data: Finger injury  LEFT THUMB 2+V  Comparison: None.  Findings: Three views of the left thumb submitted.  There is open comminuted fracture distal phalanx with associated soft tissue injury.  IMPRESSION: Open comminuted displaced fracture of distal phalanx left thumb with associated soft tissue injury.   Original Report Authenticated By: Natasha Mead, M.D.       Shelda Jakes, MD 06/24/12 Rickey Primus

## 2012-06-24 NOTE — ED Provider Notes (Signed)
History     CSN: [id number]  Arrival date & time 06/24/12  1552   None     Chief Complaint  Patient presents with  . Finger Injury    (Consider location/radiation/quality/duration/timing/severity/associated sxs/prior treatment) HPI Comments: 61 y M on ASA/plavix, UTD tetanus last fall, here after table saw injury to left thumb at approx 3:30p today.  Patient is a 72 y.o. male presenting with hand injury. The history is provided by the patient.  Hand Injury Location:  Finger Time since incident: occurred at 3:30p. Injury: yes   Finger location:  L thumb Pain details:    Quality:  Aching   Radiates to:  Does not radiate   Severity:  Severe   Onset quality:  Sudden   Timing:  Constant   Progression:  Worsening Chronicity:  New Tetanus status:  Up to date (last fall)   Past Medical History  Diagnosis Date  . Hypertension   . Tobacco abuse   . Pulmonary nodule 06/2006    9 mm right upper obe pulmonary nodule (negative Pet Scan)  . Personal history of colonic polyps 2008    multiple adenomas 2008-2009 exams with large cecal adenoma  . Hyperlipidemia   . PVD (peripheral vascular disease)     left leg s/p stent  . Seborrheic keratosis   . Sebaceous cyst     neck  . Sinusitis   . Blood clotting disorder   . Diverticulosis   . Chronic obstructive pulmonary disease (COPD)   . GERD (gastroesophageal reflux disease)     Past Surgical History  Procedure Laterality Date  . Back surgery  1975, 1978    x2 lumbar fusion over 30 years ago  . Knee surgery  2002    left  . Tonsillectomy  1956  . Angioplasty / stenting iliac  08/2008    Dr. Myra Gianotti  . Descending aortic aneurysm repair w/ stent  04/2010    Dr. Myra Gianotti  . Colonoscopy  05/11/2006; 07/27/2006;05/05/2007, 11/24/2010    adenomas with 9 polyps index and 2.5 cm cecal polyp removed over 3 exams, last 2009 with APC. diverticulosis also.  2012: 12 adenomas removed, largest 8-10 mm, diverticulosis  .  Esophagogastroduodenoscopy  05/05/2007    GERD - one erosion    Family History  Problem Relation Age of Onset  . Colon cancer Mother 61  . Heart disease Brother   . Diabetes      History  Substance Use Topics  . Smoking status: Current Every Day Smoker    Types: Cigarettes  . Smokeless tobacco: Never Used     Comment: 50 pack year history  . Alcohol Use: No      Review of Systems  All other systems reviewed and are negative.    Allergies  Review of patient's allergies indicates no known allergies.  Home Medications   Current Outpatient Rx  Name  Route  Sig  Dispense  Refill  . amLODipine (NORVASC) 5 MG tablet   Oral   Take 1 tablet (5 mg total) by mouth daily.   90 tablet   1   . aspirin 325 MG tablet   Oral   Take 1 tablet (325 mg total) by mouth daily. DO NOT TAKE AGAIN UNTIL 11/29/10         . clopidogrel (PLAVIX) 75 MG tablet      TAKE 1 TABLET (75 MG TOTAL) BY MOUTH DAILY.   90 tablet   0     NEEDS OV   .  cyclobenzaprine (FLEXERIL) 5 MG tablet   Oral   Take 1 tablet (5 mg total) by mouth 2 (two) times daily as needed for muscle spasms.   60 tablet   3   . irbesartan-hydrochlorothiazide (AVALIDE) 150-12.5 MG per tablet   Oral   Take 1 tablet by mouth daily.   90 tablet   1   . omeprazole (PRILOSEC) 20 MG capsule      TAKE 1 CAPSULE BY MOUTH 2 (TWO) TIMES DAILY.   60 capsule   0     NEEDS OV   . rosuvastatin (CRESTOR) 40 MG tablet   Oral   Take 1 tablet (40 mg total) by mouth daily.   90 tablet   1     BP 132/72  Pulse 90  Temp(Src) 97.2 F (36.2 C) (Oral)  Resp 14  SpO2 96%  Physical Exam  Constitutional: He is oriented to person, place, and time. He appears well-developed and well-nourished. No distress.  HENT:  Head: Normocephalic.  Right Ear: External ear normal.  Left Ear: External ear normal.  Nose: Nose normal.  Mouth/Throat: Oropharynx is clear and moist. No oropharyngeal exudate.  Eyes: Conjunctivae and EOM are  normal. Pupils are equal, round, and reactive to light.  Neck: Normal range of motion. Neck supple.  Cardiovascular: Normal rate, regular rhythm, normal heart sounds and intact distal pulses.  Exam reveals no gallop and no friction rub.   No murmur heard. Pulmonary/Chest: Effort normal and breath sounds normal.  Abdominal: Soft. Bowel sounds are normal. He exhibits no distension. There is no tenderness.  Musculoskeletal: Normal range of motion. He exhibits no edema and no tenderness.       Hands: Neurological: He is alert and oriented to person, place, and time. No cranial nerve deficit.  Skin: Skin is warm and dry.  Psychiatric: He has a normal mood and affect.    ED Course  Procedures (including critical care time)  Labs Reviewed  POCT I-STAT, CHEM 8 - Abnormal; Notable for the following:    BUN 28 (*)    Glucose, Bld 106 (*)    All other components within normal limits  CBC   Dg Finger Thumb Left  06/24/2012  *RADIOLOGY REPORT*  Clinical Data: Finger injury  LEFT THUMB 2+V  Comparison: None.  Findings: Three views of the left thumb submitted.  There is open comminuted fracture distal phalanx with associated soft tissue injury.  IMPRESSION: Open comminuted displaced fracture of distal phalanx left thumb with associated soft tissue injury.   Original Report Authenticated By: Natasha Mead, M.D.      1. Open displaced fracture of distal phalanx of left thumb, initial encounter   2. Laceration of left thumb with complication, initial encounter     EKG: NSR rate 70, PR 156, QRS 90, QTc 432; no STE/STD, poor R wave progression   MDM   60 y M on ASA/plavix, UTD tetanus last fall, here after table saw injury to left thumb at approx 3:30p today.  Unable to flex at IP so likely tendon injury.  Insensate on medial side and with questionable sensation on lateral side.  Concern for bony involvement.  Xray, dilaudid, zofran.  5:40 PM  Xray c/w open comminuted fracture.  Will give Ancef.   Orthopaedic hand surgury consulted.  Pt seen in conjunction with my attending, Dr. Deretha Emory.  Oleh Genin, MD PGY-II [facility] Emergency Medicine Resident   Oleh Genin, MD 06/25/12 0120

## 2012-06-24 NOTE — ED Notes (Signed)
Patient transported to X-ray 

## 2012-06-24 NOTE — ED Notes (Signed)
Pt being transported to OR by NT. Report has been called. All belongings sent with patient.

## 2012-06-24 NOTE — Anesthesia Procedure Notes (Signed)
Procedure Name: Intubation Date/Time: 06/24/2012 7:23 PM Performed by: Angelica Pou Pre-anesthesia Checklist: Patient identified, Timeout performed, Emergency Drugs available, Suction available and Patient being monitored Patient Re-evaluated:Patient Re-evaluated prior to inductionOxygen Delivery Method: Circle system utilized Preoxygenation: Pre-oxygenation with 100% oxygen Intubation Type: IV induction Ventilation: Mask ventilation without difficulty Laryngoscope Size: Mac and 4 Grade View: Grade I Tube type: Oral Tube size: 7.5 mm Number of attempts: 1 Airway Equipment and Method: Stylet and Oral airway Placement Confirmation: ETT inserted through vocal cords under direct vision,  breath sounds checked- equal and bilateral and positive ETCO2 Secured at: 23 cm Tube secured with: Tape Dental Injury: Teeth and Oropharynx as per pre-operative assessment

## 2012-06-24 NOTE — Anesthesia Postprocedure Evaluation (Signed)
  Anesthesia Post-op Note  Patient: Timothy Wood  Procedure(s) Performed: Procedure(s): Repair Complex Laceration Left Thumb with percutaneous pinning. (Left)  Patient Location: PACU  Anesthesia Type:General  Level of Consciousness: awake, alert  and oriented  Airway and Oxygen Therapy: Patient Spontanous Breathing and Patient connected to nasal cannula oxygen  Post-op Pain: none  Post-op Assessment: Post-op Vital signs reviewed, Patient's Cardiovascular Status Stable, Respiratory Function Stable, Patent Airway and Pain level controlled  Post-op Vital Signs: stable  Complications: No apparent anesthesia complications

## 2012-06-24 NOTE — Anesthesia Preprocedure Evaluation (Addendum)
Anesthesia Evaluation  Patient identified by MRN, date of birth, ID band Patient awake    Reviewed: Allergy & Precautions, H&P , NPO status , Patient's Chart, lab work & pertinent test results  History of Anesthesia Complications Negative for: history of anesthetic complications  Airway Mallampati: I TM Distance: >3 FB Neck ROM: Full    Dental  (+) Teeth Intact and Dental Advisory Given   Pulmonary COPD Ongoing tobacco use breath sounds clear to auscultation  Pulmonary exam normal       Cardiovascular hypertension, Pt. on medications + Peripheral Vascular Disease Rhythm:Regular Rate:Normal     Neuro/Psych    GI/Hepatic Neg liver ROS, GERD-  Medicated and Controlled,  Endo/Other  negative endocrine ROS  Renal/GU negative Renal ROS     Musculoskeletal   Abdominal   Peds  Hematology Takes Plavix for "groin stents" r/t PVD.    Anesthesia Other Findings   Reproductive/Obstetrics                           Anesthesia Physical Anesthesia Plan  ASA: III and emergent  Anesthesia Plan: General   Post-op Pain Management:    Induction: Intravenous  Airway Management Planned: Oral ETT  Additional Equipment:   Intra-op Plan:   Post-operative Plan: Extubation in OR  Informed Consent: I have reviewed the patients History and Physical, chart, labs and discussed the procedure including the risks, benefits and alternatives for the proposed anesthesia with the patient or authorized representative who has indicated his/her understanding and acceptance.   Dental advisory given  Plan Discussed with: CRNA and Surgeon  Anesthesia Plan Comments: (Complex laceration L. Thumb Htn PVD S/P femoral artery stent Smoker/COPD  Kipp Brood, MD )        Anesthesia Quick Evaluation

## 2012-06-25 NOTE — Op Note (Signed)
NAMEPRIMITIVO, MERKEY NO.:  [id number]  MEDICAL RECORD NO.:  [id number]  LOCATION:  [facility]                         FACILITY:  [facility]  PHYSICIAN:  Johnette Abraham, MD    DATE OF BIRTH:  07/31/1940  DATE OF PROCEDURE:  06/24/2012 DATE OF DISCHARGE:  06/24/2012                              OPERATIVE REPORT   PREOPERATIVE DIAGNOSIS:  Complex laceration and open fracture of the left thumb.  POSTOPERATIVE DIAGNOSIS:  Complex laceration and open fracture of the left thumb.  PROCEDURES: 1. Exploration of wound of the left thumb, irrigation and debridement     of skin, subcutaneous tissue, and bone. 2. Open reduction and internal fixation of the distal phalanx with a     0.62 K-wire. 3. Suture of the distal insertion of the flexor tendon. 4. Complex closure of wound.  INDICATIONS:  Mr. Sarver is a pleasant gentleman who was using a circular saw and lacerated his thumb, complained of bleeding, pain presented to the emergency room where he was evaluated.  On my examination, he near amputation of the thumb pulp, however, the distal tip was pink.  Risks, benefits, and alternatives of surgery were thoroughly discussed with him and his significant other.  Consent was obtained for surgery.  PROCEDURE IN DETAIL:  The patient was taken the operating room, placed supine on the operating table.  Time-out was performed.  Preoperative antibiotics were given.  The tourniquet was used.  After general anesthesia was administered, inflated to 250 mmHg.  The wound was then evaluated, irrigated copiously with approximately 1 L of normal saline solution.  Skin and subcutaneous tissue and small fragments of bone were debrided from the wound.  There was a fairly good size fragment distally and a small fragments proximally of the distal phalanx that with shortening of the distal portion of the thumb, these could be brought into close proximity.  A 0.062 K-wire was driven in a retrograde  fashion through the distal portion of the bone and then aligned and then under fluoroscopy drilled proximally into the proximal phalanx satisfactorily reducing the fracture.  Next, the wound was opened.  There was fraying and laceration of the distal insertion of the flexor tendon.  The flexor tendon was in the wound.  There was some small fragments of the tendon distally that were intact with a 4-0 Ethibond suture and figure-of- eight.  The tendon was approximated to the insertion.  Following thorough irrigation again was obtained, the pulp was closed after further debridement and a slight rearrangement of skin to cover the defect.  Afterwards, the pin was cut just outside of the skin. Tourniquet was released.  The thumb returned to a pink color.  The sterile dressing and splint were placed.  The patient tolerated the procedure well, was taken to recovery in stable.    Johnette Abraham, MD    HCC/MEDQ  D:  06/24/2012  T:  06/25/2012  Job:  [id number]

## 2012-06-25 NOTE — ED Provider Notes (Signed)
I saw and evaluated the patient, reviewed the resident's note and I agree with the findings and plan.   Shelda Jakes, MD 06/25/12 2012

## 2012-06-28 ENCOUNTER — Encounter (HOSPITAL_COMMUNITY): Payer: Self-pay | Admitting: General Surgery

## 2012-07-21 ENCOUNTER — Other Ambulatory Visit: Payer: Self-pay | Admitting: Internal Medicine

## 2012-07-25 ENCOUNTER — Other Ambulatory Visit (INDEPENDENT_AMBULATORY_CARE_PROVIDER_SITE_OTHER): Payer: Medicare Other

## 2012-07-25 DIAGNOSIS — Z0000 Encounter for general adult medical examination without abnormal findings: Secondary | ICD-10-CM

## 2012-07-25 LAB — BASIC METABOLIC PANEL
BUN: 17 mg/dL (ref 6–23)
CO2: 26 mEq/L (ref 19–32)
Calcium: 9.1 mg/dL (ref 8.4–10.5)
Creatinine, Ser: 0.9 mg/dL (ref 0.4–1.5)
GFR: 84 mL/min (ref >60.00)
Glucose, Bld: 100 mg/dL — ABNORMAL HIGH (ref 70–99)

## 2012-07-25 LAB — HEPATIC FUNCTION PANEL
ALT: 16 U/L (ref 0–53)
Total Bilirubin: 0.5 mg/dL (ref 0.3–1.2)

## 2012-07-25 LAB — POCT URINALYSIS DIPSTICK
Bilirubin, UA: NEGATIVE
Blood, UA: NEGATIVE
Glucose, UA: NEGATIVE
Leukocytes, UA: NEGATIVE
Nitrite, UA: NEGATIVE
Urobilinogen, UA: 1

## 2012-07-25 LAB — LIPID PANEL: Total CHOL/HDL Ratio: 4

## 2012-07-25 LAB — CBC WITH DIFFERENTIAL/PLATELET
Basophils Absolute: 0 10*3/uL (ref 0.0–0.1)
Basophils Relative: 0.4 % (ref 0.0–3.0)
Eosinophils Absolute: 0.1 10*3/uL (ref 0.0–0.7)
Lymphocytes Relative: 23.5 % (ref 12.0–46.0)
MCHC: 34.3 g/dL (ref 30.0–36.0)
Neutrophils Relative %: 65.7 % (ref 43.0–77.0)
Platelets: 224 10*3/uL (ref 150.0–400.0)
RBC: 5.01 Mil/uL (ref 4.22–5.81)
RDW: 14.1 % (ref 11.5–14.6)

## 2012-07-25 LAB — PSA: PSA: 0.37 ng/mL (ref 0.10–4.00)

## 2012-08-01 ENCOUNTER — Encounter: Payer: Self-pay | Admitting: Internal Medicine

## 2012-08-01 ENCOUNTER — Ambulatory Visit (INDEPENDENT_AMBULATORY_CARE_PROVIDER_SITE_OTHER): Payer: Medicare Other | Admitting: Internal Medicine

## 2012-08-01 VITALS — BP 136/88 | HR 76 | Temp 97.3°F | Ht 68.75 in | Wt 175.0 lb

## 2012-08-01 DIAGNOSIS — Z23 Encounter for immunization: Secondary | ICD-10-CM

## 2012-08-01 DIAGNOSIS — L255 Unspecified contact dermatitis due to plants, except food: Secondary | ICD-10-CM

## 2012-08-01 DIAGNOSIS — I739 Peripheral vascular disease, unspecified: Secondary | ICD-10-CM

## 2012-08-01 DIAGNOSIS — L237 Allergic contact dermatitis due to plants, except food: Secondary | ICD-10-CM

## 2012-08-01 DIAGNOSIS — I10 Essential (primary) hypertension: Secondary | ICD-10-CM

## 2012-08-01 DIAGNOSIS — N429 Disorder of prostate, unspecified: Secondary | ICD-10-CM

## 2012-08-01 DIAGNOSIS — E785 Hyperlipidemia, unspecified: Secondary | ICD-10-CM

## 2012-08-01 MED ORDER — IRBESARTAN-HYDROCHLOROTHIAZIDE 150-12.5 MG PO TABS: 1.0000 | ORAL_TABLET | Freq: Every day | ORAL | Status: DC

## 2012-08-01 MED ORDER — METHYLPREDNISOLONE ACETATE 40 MG/ML IJ SUSP: 80.0000 mg | Freq: Once | INTRAMUSCULAR | Status: DC

## 2012-08-01 MED ORDER — AMLODIPINE BESYLATE 5 MG PO TABS: 5.0000 mg | ORAL_TABLET | Freq: Every day | ORAL | Status: DC

## 2012-08-01 MED ORDER — ROSUVASTATIN CALCIUM 40 MG PO TABS: 40.0000 mg | ORAL_TABLET | Freq: Every day | ORAL | Status: DC

## 2012-08-01 MED ORDER — CLOBETASOL PROPIONATE 0.05 % EX CREA: TOPICAL_CREAM | Freq: Two times a day (BID) | CUTANEOUS | Status: DC

## 2012-08-01 MED ORDER — METHYLPREDNISOLONE ACETATE 80 MG/ML IJ SUSP
80.0000 mg | Freq: Once | INTRAMUSCULAR | Status: AC
  Administered 2012-08-01: 80 mg via INTRAMUSCULAR

## 2012-08-01 MED ORDER — PANTOPRAZOLE SODIUM 40 MG PO TBEC: 40.0000 mg | DELAYED_RELEASE_TABLET | Freq: Every day | ORAL | Status: DC

## 2012-08-01 MED ORDER — CLOPIDOGREL BISULFATE 75 MG PO TABS: 75.0000 mg | ORAL_TABLET | Freq: Every morning | ORAL | Status: DC

## 2012-08-01 NOTE — Assessment & Plan Note (Signed)
Patient's PSA is normal. However on digital rectal exam he has prostate asymmetry. Left side more prominent than right. Refer to urologist for further evaluation.

## 2012-08-01 NOTE — Assessment & Plan Note (Signed)
Patient encouraged to followup with his vascular specialist.

## 2012-08-01 NOTE — Progress Notes (Signed)
Subjective:    Patient ID: Timothy Wood, male    DOB: 03/26/1940, 72 y.o.   MRN: [id number]  HPI  72 year old white male with history of hypertension, hyperlipidemia and peripheral vascular disease for routine followup. Since previous visit patient was seen in ER for injury to left thumb. Patient was seen by hand specialist.  Patient is due for followup with his vascular specialist.  Hypertension-stable  No change in patient smoking.  Review of Systems  Constitutional: Negative for activity change, appetite change and unexpected weight change.  Eyes: Negative for visual disturbance.  Respiratory: Negative for cough, chest tightness and shortness of breath.   Cardiovascular: Negative for chest pain.  Genitourinary: Negative for difficulty urinating.  Neurological: Negative for headaches.  Gastrointestinal: Negative for abdominal pain, heartburn melena or hematochezia Psych: Negative for depression or anxiety Endo:  No polyuria or polydypsia    Past Medical History  Diagnosis Date  . Hypertension   . Tobacco abuse   . Pulmonary nodule 06/2006    9 mm right upper obe pulmonary nodule (negative Pet Scan)  . Personal history of colonic polyps 2008    multiple adenomas 2008-2009 exams with large cecal adenoma  . Hyperlipidemia   . PVD (peripheral vascular disease)     left leg s/p stent  . Seborrheic keratosis   . Sebaceous cyst     neck  . Sinusitis   . Blood clotting disorder   . Diverticulosis   . Chronic obstructive pulmonary disease (COPD)   . GERD (gastroesophageal reflux disease)     History   Social History  . Marital Status: Married    Spouse Name: N/A    Number of Children: 2  . Years of Education: N/A   Occupational History  . Retired    Social History Main Topics  . Smoking status: Current Every Day Smoker    Types: Cigarettes  . Smokeless tobacco: Never Used     Comment: 50 pack year history  . Alcohol Use: No  . Drug Use: No  . Sexually  Active: Not on file   Other Topics Concern  . Not on file   Social History Narrative   The patient is happily married.  He lives with his wife.     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     Occasional alcohol.      Tobacco use - over 50 pack years.   Father died at age 43 of suicide.    Past Surgical History  Procedure Laterality Date  . Back surgery  1975, 1978    x2 lumbar fusion over 30 years ago  . Knee surgery  2002    left  . Tonsillectomy  1956  . Angioplasty / stenting iliac  08/2008    Dr. Myra Gianotti  . Descending aortic aneurysm repair w/ stent  04/2010    Dr. Myra Gianotti  . Colonoscopy  05/11/2006; 07/27/2006;05/05/2007, 11/24/2010    adenomas with 9 polyps index and 2.5 cm cecal polyp removed over 3 exams, last 2009 with APC. diverticulosis also.  2012: 12 adenomas removed, largest 8-10 mm, diverticulosis  . Esophagogastroduodenoscopy  05/05/2007    GERD - one erosion  . Percutaneous pinning Left 06/24/2012    Procedure: Repair Complex Laceration Left Thumb with percutaneous pinning.;  Surgeon: Johnette Abraham, MD;  Location: [facility] OR;  Service: Plastics;  Laterality: Left;    Family History  Problem Relation Age of Onset  . Colon cancer Mother 26  . Heart disease  Brother   . Diabetes      No Known Allergies  Current Outpatient Prescriptions on File Prior to Visit  Medication Sig Dispense Refill  . aspirin 325 MG tablet Take 325 mg by mouth every morning.       No current facility-administered medications on file prior to visit.    BP 136/88  Pulse 76  Temp(Src) 97.3 F (36.3 C) (Oral)  Ht 5' 8.75" (1.746 m)  Wt 175 lb (79.379 kg)  BMI 26.04 kg/m2        Objective:   Physical Exam  Constitutional: He is oriented to person, place, and time. He appears well-nourished. No distress.  HENT:  Head: Normocephalic and atraumatic.  Right Ear: External ear normal.  Left Ear: External ear normal.  Eyes: EOM are normal. Pupils are equal, round, and reactive to  light.  Neck: Neck supple.  Cardiovascular: Normal rate and regular rhythm.   Pulmonary/Chest: Effort normal and breath sounds normal. He has no wheezes.  Abdominal: Soft. Bowel sounds are normal. There is no tenderness.  Genitourinary: Rectum normal. Guaiac negative stool.  Asymmetry of prostate gland, left side more prominent than right  Musculoskeletal: He exhibits no edema.  Lymphadenopathy:    He has no cervical adenopathy.  Neurological: He is alert and oriented to person, place, and time. No cranial nerve deficit.  Skin: Skin is warm and dry.  Small erythematous patches right upper 4 head, right hand, and left forearm  Psychiatric: He has a normal mood and affect. His behavior is normal.          Assessment & Plan:

## 2012-08-01 NOTE — Patient Instructions (Addendum)
Please complete the following lab tests before your next follow up appointment: BMET - A1c Varicella titer - V70

## 2012-08-01 NOTE — Assessment & Plan Note (Signed)
LDL at goal. Continue Crestor 40 mg.  Smoking cessation strongly encouraged. Patient declines trial of Chantix.

## 2012-08-01 NOTE — Assessment & Plan Note (Signed)
Well controlled.  Continue same antihypertensive regimen

## 2012-08-09 ENCOUNTER — Emergency Department (HOSPITAL_COMMUNITY): Payer: Medicare Other

## 2012-08-09 ENCOUNTER — Encounter (HOSPITAL_COMMUNITY): Payer: Self-pay

## 2012-08-09 ENCOUNTER — Emergency Department (HOSPITAL_COMMUNITY)
Admission: EM | Admit: 2012-08-09 | Discharge: 2012-08-09 | Disposition: A | Discharge status: Home / Self Care | Payer: Medicare Other | Attending: Emergency Medicine | Admitting: Emergency Medicine

## 2012-08-09 DIAGNOSIS — W298XXA Contact with other powered powered hand tools and household machinery, initial encounter: Secondary | ICD-10-CM | POA: Insufficient documentation

## 2012-08-09 DIAGNOSIS — Y9269 Other specified industrial and construction area as the place of occurrence of the external cause: Secondary | ICD-10-CM | POA: Insufficient documentation

## 2012-08-09 DIAGNOSIS — Y9389 Activity, other specified: Secondary | ICD-10-CM | POA: Insufficient documentation

## 2012-08-09 DIAGNOSIS — K219 Gastro-esophageal reflux disease without esophagitis: Secondary | ICD-10-CM | POA: Insufficient documentation

## 2012-08-09 DIAGNOSIS — J189 Pneumonia, unspecified organism: Secondary | ICD-10-CM | POA: Insufficient documentation

## 2012-08-09 DIAGNOSIS — Z8719 Personal history of other diseases of the digestive system: Secondary | ICD-10-CM | POA: Insufficient documentation

## 2012-08-09 DIAGNOSIS — Z860100 Personal history of colon polyps, unspecified: Secondary | ICD-10-CM | POA: Insufficient documentation

## 2012-08-09 DIAGNOSIS — I10 Essential (primary) hypertension: Secondary | ICD-10-CM | POA: Insufficient documentation

## 2012-08-09 DIAGNOSIS — Z79899 Other long term (current) drug therapy: Secondary | ICD-10-CM | POA: Insufficient documentation

## 2012-08-09 DIAGNOSIS — Z7982 Long term (current) use of aspirin: Secondary | ICD-10-CM | POA: Insufficient documentation

## 2012-08-09 DIAGNOSIS — Z872 Personal history of diseases of the skin and subcutaneous tissue: Secondary | ICD-10-CM | POA: Insufficient documentation

## 2012-08-09 DIAGNOSIS — E785 Hyperlipidemia, unspecified: Secondary | ICD-10-CM | POA: Insufficient documentation

## 2012-08-09 DIAGNOSIS — S62601B Fracture of unspecified phalanx of left index finger, initial encounter for open fracture: Secondary | ICD-10-CM

## 2012-08-09 DIAGNOSIS — S61209A Unspecified open wound of unspecified finger without damage to nail, initial encounter: Secondary | ICD-10-CM | POA: Insufficient documentation

## 2012-08-09 DIAGNOSIS — F17200 Nicotine dependence, unspecified, uncomplicated: Secondary | ICD-10-CM | POA: Insufficient documentation

## 2012-08-09 DIAGNOSIS — Z8709 Personal history of other diseases of the respiratory system: Secondary | ICD-10-CM | POA: Insufficient documentation

## 2012-08-09 DIAGNOSIS — S62609B Fracture of unspecified phalanx of unspecified finger, initial encounter for open fracture: Secondary | ICD-10-CM | POA: Insufficient documentation

## 2012-08-09 DIAGNOSIS — Z7901 Long term (current) use of anticoagulants: Secondary | ICD-10-CM | POA: Insufficient documentation

## 2012-08-09 DIAGNOSIS — Z8679 Personal history of other diseases of the circulatory system: Secondary | ICD-10-CM | POA: Insufficient documentation

## 2012-08-09 DIAGNOSIS — D689 Coagulation defect, unspecified: Secondary | ICD-10-CM | POA: Insufficient documentation

## 2012-08-09 MED ORDER — CEFAZOLIN SODIUM 1 G IJ SOLR
2.0000 g | Freq: Once | INTRAMUSCULAR | Status: AC
  Administered 2012-08-09: 2 g via INTRAMUSCULAR
  Filled 2012-08-09: qty 20

## 2012-08-09 MED ORDER — OXYCODONE-ACETAMINOPHEN 5-325 MG PO TABS
1.0000 | ORAL_TABLET | Freq: Once | ORAL | Status: AC
  Administered 2012-08-09: 1 via ORAL
  Filled 2012-08-09: qty 1

## 2012-08-09 MED ORDER — CEPHALEXIN 500 MG PO CAPS: 500.0000 mg | ORAL_CAPSULE | Freq: Four times a day (QID) | ORAL | Status: DC

## 2012-08-09 NOTE — ED Notes (Signed)
Pt given update on status of Dr. Amanda Pea coming to see him.  Informed him he was finishing up in surgery.  Pt reported understanding the delay.

## 2012-08-09 NOTE — Consult Note (Signed)
Reason for Consult:left index finger tablesaw injury Referring Physician: ER Staff  Timothy Wood is an 72 y.o. male.  HPI: Timothy Wood KitchenMarland KitchenPatient presents for evaluation and treatment of the of their upper extremity predicament. The patient denies neck back chest or of abdominal pain. The patient notes that they have no lower extremity problems. The patient from primarily complains of the upper extremity pain noted.  Past Medical History  Diagnosis Date  . Hypertension   . Tobacco abuse   . Pulmonary nodule 06/2006    9 mm right upper obe pulmonary nodule (negative Pet Scan)  . Personal history of colonic polyps 2008    multiple adenomas 2008-2009 exams with large cecal adenoma  . Hyperlipidemia   . PVD (peripheral vascular disease)     left leg s/p stent  . Seborrheic keratosis   . Sebaceous cyst     neck  . Sinusitis   . Blood clotting disorder   . Diverticulosis   . Chronic obstructive pulmonary disease (COPD)   . GERD (gastroesophageal reflux disease)     Past Surgical History  Procedure Laterality Date  . Back surgery  1975, 1978    x2 lumbar fusion over 30 years ago  . Knee surgery  2002    left  . Tonsillectomy  1956  . Angioplasty / stenting iliac  08/2008    Dr. Myra Gianotti  . Descending aortic aneurysm repair w/ stent  04/2010    Dr. Myra Gianotti  . Colonoscopy  05/11/2006; 07/27/2006;05/05/2007, 11/24/2010    adenomas with 9 polyps index and 2.5 cm cecal polyp removed over 3 exams, last 2009 with APC. diverticulosis also.  2012: 12 adenomas removed, largest 8-10 mm, diverticulosis  . Esophagogastroduodenoscopy  05/05/2007    GERD - one erosion  . Percutaneous pinning Left 06/24/2012    Procedure: Repair Complex Laceration Left Thumb with percutaneous pinning.;  Surgeon: Johnette Abraham, MD;  Location: [facility] OR;  Service: Plastics;  Laterality: Left;    Family History  Problem Relation Age of Onset  . Colon cancer Mother 50  . Heart disease Brother   . Diabetes      Social History:   reports that he has been smoking Cigarettes.  He has been smoking about 0.00 packs per day. He has never used smokeless tobacco. He reports that he does not drink alcohol or use illicit drugs.  Allergies:  Allergies  Allergen Reactions  . Hydrocodone     Medications: I have reviewed the patient's current medications.  No results found for this or any previous visit (from the past 48 hour(s)).  Dg Finger Index Left  08/09/2012   *RADIOLOGY REPORT*  Clinical Data: Injury, pain, cut with saw  LEFT INDEX FINGER 2+V  Comparison: 06/24/2012  Findings: There is an acute open, dated fracture of the left index finger distal phalanx.  This predominately involves the distal phalangeal tuft.  Small dorsal radiopaque foreign body noted in the soft tissues.  No subluxation or dislocation.  Fixation pin noted in the left thumb, incompletely imaged.  IMPRESSION: Open comminuted fracture of the left index finger distal phalanx.  Small punctate foreign body dorsally.   Original Report Authenticated By: Judie Petit. Miles Costain, M.D.    Review of Systems  HENT: Negative.   Eyes: Negative.   Cardiovascular: Negative.   Gastrointestinal: Negative.   Genitourinary: Negative.   Skin: Negative.   Neurological: Negative.   Endo/Heme/Allergies: Negative.    Blood pressure 138/71, pulse 87, temperature 98.2 F (36.8 C), temperature source Oral, resp.  rate 16, height 5\' 8"  (1.727 m), weight 76.839 kg (169 lb 6.4 oz), SpO2 96.00%. Physical Exam Left index partial amputation .Timothy Wood KitchenThe patient is alert and oriented in no acute distress the patient complains of pain in the affected upper extremity.  The patient is noted to have a normal HEENT exam.  Lung fields show equal chest expansion and no shortness of breath  abdomen exam is nontender without distention.  Lower extremity examination does not show any fracture dislocation or blood clot symptoms.  Pelvis is stable neck and back are stable and nontender  Thumb I&D of skin  performed Assessment/Plan:Timothy Wood Kitchen.We are planning surgery for your upper extremity. The risk and benefits of surgery include risk of bleeding infection anesthesia damage to normal structures and failure of the surgery to accomplish its intended goals of relieving symptoms and restoring function with this in mind we'll going to proceed. I have specifically discussed with the patient the pre-and postoperative regime and the does and don'ts and risk and benefits in great detail. Risk and benefits of surgery also include risk of dystrophy chronic nerve pain failure of the healing process to go onto completion and other inherent risks of surgery The relavent the pathophysiology of the disease/injury process, as well as the alternatives for treatment and postoperative course of action has been discussed in great detail with the patient who desires to proceed.  We will do everything in our power to help you (the patient) restore function to the upper extremity. Is a pleasure to see this patient today.   See Dictation # [id number]    Karen Chafe 08/09/2012, 7:46 PM

## 2012-08-09 NOTE — ED Provider Notes (Signed)
History    This chart was scribed for non-physician practitioner working with Glynn Octave, MD by Donne Anon, ED Scribe. This patient was seen in room TR10C/TR10C and the patient's care was started at 1525.   CSN: [id number]  Arrival date & time 08/09/12  1252   First MD Initiated Contact with Patient 08/09/12 1525      Chief Complaint  Patient presents with  . Laceration     The history is provided by the patient and the spouse. No language interpreter was used.   HPI Comments: Timothy Wood is a 72 y.o. male with hx HTN, who presents to the Emergency Department complaining of a laceration to his left index finger. He states he was working with a saw when he accidentally cut himself. He cut his left thumb 6 weeks ago with the same saw and recently had surgery on it, performed by Dr. Izora Ribas. He denies numbness or any other pain. He states his Tetanus is UTD. He currently takes Aspirin and Plavix.   Past Medical History  Diagnosis Date  . Hypertension   . Tobacco abuse   . Pulmonary nodule 06/2006    9 mm right upper obe pulmonary nodule (negative Pet Scan)  . Personal history of colonic polyps 2008    multiple adenomas 2008-2009 exams with large cecal adenoma  . Hyperlipidemia   . PVD (peripheral vascular disease)     left leg s/p stent  . Seborrheic keratosis   . Sebaceous cyst     neck  . Sinusitis   . Blood clotting disorder   . Diverticulosis   . Chronic obstructive pulmonary disease (COPD)   . GERD (gastroesophageal reflux disease)     Past Surgical History  Procedure Laterality Date  . Back surgery  1975, 1978    x2 lumbar fusion over 30 years ago  . Knee surgery  2002    left  . Tonsillectomy  1956  . Angioplasty / stenting iliac  08/2008    Dr. Myra Gianotti  . Descending aortic aneurysm repair w/ stent  04/2010    Dr. Myra Gianotti  . Colonoscopy  05/11/2006; 07/27/2006;05/05/2007, 11/24/2010    adenomas with 9 polyps index and 2.5 cm cecal polyp removed over 3  exams, last 2009 with APC. diverticulosis also.  2012: 12 adenomas removed, largest 8-10 mm, diverticulosis  . Esophagogastroduodenoscopy  05/05/2007    GERD - one erosion  . Percutaneous pinning Left 06/24/2012    Procedure: Repair Complex Laceration Left Thumb with percutaneous pinning.;  Surgeon: Johnette Abraham, MD;  Location: [facility] OR;  Service: Plastics;  Laterality: Left;    Family History  Problem Relation Age of Onset  . Colon cancer Mother 82  . Heart disease Brother   . Diabetes      History  Substance Use Topics  . Smoking status: Current Every Day Smoker    Types: Cigarettes  . Smokeless tobacco: Never Used     Comment: 50 pack year history  . Alcohol Use: No      Review of Systems  Skin: Positive for wound.  Neurological: Negative for numbness.  All other systems reviewed and are negative.    Allergies  Oxycontin  Home Medications   Current Outpatient Rx  Name  Route  Sig  Dispense  Refill  . amLODipine (NORVASC) 5 MG tablet   Oral   Take 1 tablet (5 mg total) by mouth daily.   90 tablet   3   . aspirin 325 MG tablet  Oral   Take 325 mg by mouth every morning.         . clobetasol cream (TEMOVATE) 0.05 %   Topical   Apply topically 2 (two) times daily.   30 g   1     Use for 2 weeks (poison ivy).  Avoid contact near  ...   . clopidogrel (PLAVIX) 75 MG tablet   Oral   Take 1 tablet (75 mg total) by mouth every morning.   90 tablet   3   . irbesartan-hydrochlorothiazide (AVALIDE) 150-12.5 MG per tablet   Oral   Take 1 tablet by mouth daily.   90 tablet   3   . pantoprazole (PROTONIX) 40 MG tablet   Oral   Take 1 tablet (40 mg total) by mouth daily.   90 tablet   3   . rosuvastatin (CRESTOR) 40 MG tablet   Oral   Take 1 tablet (40 mg total) by mouth daily.   90 tablet   3     BP 138/71  Pulse 87  Temp(Src) 98.2 F (36.8 C) (Oral)  Resp 16  Ht 5\' 8"  (1.727 m)  Wt 169 lb 6.4 oz (76.839 kg)  BMI 25.76 kg/m2  SpO2  96%  Physical Exam  Nursing note and vitals reviewed. Constitutional: He is oriented to person, place, and time. He appears well-developed and well-nourished. No distress.  HENT:  Head: Normocephalic and atraumatic.  Eyes: EOM are normal.  Neck: Neck supple. No tracheal deviation present.  Cardiovascular: Normal rate.   Pulmonary/Chest: Effort normal. No respiratory distress.  Musculoskeletal: Normal range of motion.  Neurological: He is alert and oriented to person, place, and time.  Skin: Skin is warm and dry.  Laceration that extends from lateral nail to pad of finger with gapping. No bone or tendon exposure. Nail intact without subungual hematoma. FROM all joints of finger.   Psychiatric: He has a normal mood and affect. His behavior is normal.    ED Course  Procedures (including critical care time) DIAGNOSTIC STUDIES: Oxygen Saturation is 96% on room air, adequate by my interpretation.    COORDINATION OF CARE: 3:33 PMDiscussed treatment plan which includes consultation by phone with Dr. Izora Ribas.   Labs Reviewed - No data to display Dg Finger Index Left  08/09/2012   *RADIOLOGY REPORT*  Clinical Data: Injury, pain, cut with saw  LEFT INDEX FINGER 2+V  Comparison: 06/24/2012  Findings: There is an acute open, dated fracture of the left index finger distal phalanx.  This predominately involves the distal phalangeal tuft.  Small dorsal radiopaque foreign body noted in the soft tissues.  No subluxation or dislocation.  Fixation pin noted in the left thumb, incompletely imaged.  IMPRESSION: Open comminuted fracture of the left index finger distal phalanx.  Small punctate foreign body dorsally.   Original Report Authenticated By: Judie Petit. Miles Costain, M.D.     No diagnosis found.  1. Open fracture finger 2.   MDM  Dr. Izora Ribas deferred care to on-call hand as condition is a new problem than what he was previously consulted for. Discussed with Dr. Amanda Pea who will be in to see patient in the  department.  I personally performed the services described in this documentation, which was scribed in my presence. The recorded information has been reviewed and is accurate.         Arnoldo Hooker, PA-C 08/13/12 1637

## 2012-08-09 NOTE — ED Notes (Signed)
Lt. Index finger laceration, bleeding controlled .  1.5 cm.  Side of the finger.

## 2012-08-10 NOTE — Consult Note (Signed)
NAMEABAYOMI, PATTISON NO.:  [id number]  MEDICAL RECORD NO.:  [id number]  LOCATION:  TR10C                        FACILITY:  [facility]  PHYSICIAN:  Dionne Ano. Madeline Pho, M.D.DATE OF BIRTH:  11-Sep-1940  DATE OF CONSULTATION:  08/09/2012 DATE OF DISCHARGE:  08/09/2012                                CONSULTATION   Timothy Wood presents for evaluation and treatment of his upper extremity predicament.  This patient injured his thumb 6 weeks ago.  Dr. Izora Ribas presided over this endeavor.  He presents to the emergency room today on Aug 09, 2012, due to the fact that he is now lacerated with table saw on his left index finger.  Previously, Dr. Izora Ribas fixed his left thumb.  He still has a pin in.  He is trying to take care with it as the best seen as have it certainly, it is a bit noncompliant.  I have examined him at length.  PHYSICAL EXAMINATION:  His examination shows pleasant male, alert, oriented, and in no distress.  Vital signs are stable.  The patient has full sensation to the middle, ring and small fingers.  The thumb is bandaged.  The left index finger has complete disarray of the soft tissues.  He has some degree nail bed injury as well as fracture about the bone and soft tissue disarray.  I have reviewed this with him at length and the findings.  At present juncture, x-rays do show a small fracture about the distal end of the finger.  He has had a small metallic object in the end of the finger as well, this has been present for quite some time.  I have discussed the patient he currently has an open fracture with soft tissue disarray.  I have recommended I and D and repair.  The patient underwent intermetacarpal block performed by myself. Following this, he was prepped and draped in usual sterile fashion, Betadine scrub and paint x2.  Following this, he underwent I and D again of the skin, subcutaneous tissue, and bone.  Once this was done, we performed  excisional debridement of the open fracture.  Following this, I performed a partial nail plate removal.  Following this, we performed complex closure of the wound with a volar advancement flap.  The patient tolerated this well.  There were no complicating features. Following this, he was dressed sterilely.  Adaptic, Xeroform gauze and finger dressing was placed.  He does have an open pin site at the distal end of his left thumb.  The left thumb has some degree of poor soft tissue healing.  He has some devitalized tissue and I would recommend that he have this looked at immediately with Dr. Izora Ribas so that he can go ahead and consider pin removal if his radiographs are stable.  I discussed with the patient that I want to see him back in my office in 10-14 days.  I would like him to see Dr. Izora Ribas as soon as possible.  I am going to place him on Keflex for antibiotic purposes in addition to this Norco p.r.n. pain, vitamin C, Peri-Colace.  Understand our postop algorithm will be adhered to.  Should he have any problems,  he will notify us.  Otherwise, we can look forward to seeing him back in the office in approximately 7-10 days.  Once again, he underwent I and D of skin, subcutaneous tissue, bone, and peritenon tissue, this was an excisional debridement.  Partial nail plate removal.  Volar soft tissue advancement secondary to partial injury and amputation to the finger.  He underwent open treatment of his distal phalanx fracture as well of course.     Dionne Ano. Amanda Pea, M.D.     [facility]  D:  08/09/2012  T:  08/10/2012  Job:  [id number]

## 2012-08-13 NOTE — ED Provider Notes (Signed)
Medical screening examination/treatment/procedure(s) were performed by non-physician practitioner and as supervising physician I was immediately available for consultation/collaboration.   Vasco Chong, MD 08/13/12 1806 

## 2012-08-24 ENCOUNTER — Other Ambulatory Visit: Payer: Self-pay | Admitting: Internal Medicine

## 2012-08-31 ENCOUNTER — Other Ambulatory Visit: Payer: Self-pay | Admitting: Internal Medicine

## 2013-01-11 ENCOUNTER — Telehealth: Payer: Self-pay | Admitting: *Deleted

## 2013-01-11 NOTE — Telephone Encounter (Signed)
xxxx 

## 2013-01-11 NOTE — Telephone Encounter (Signed)
RETURNED PATIENT'S PHONE CALL, LVM FOR A RETURN CALL 

## 2013-01-26 ENCOUNTER — Other Ambulatory Visit (INDEPENDENT_AMBULATORY_CARE_PROVIDER_SITE_OTHER): Payer: Medicare Other

## 2013-01-26 DIAGNOSIS — Z0000 Encounter for general adult medical examination without abnormal findings: Secondary | ICD-10-CM

## 2013-01-26 LAB — HEMOGLOBIN A1C: Hgb A1c MFr Bld: 5.7 % (ref 4.6–6.5)

## 2013-01-26 LAB — BASIC METABOLIC PANEL
Chloride: 103 mEq/L (ref 96–112)
GFR: 87.07 mL/min (ref >60.00)
Glucose, Bld: 98 mg/dL (ref 70–99)
Potassium: 4.3 mEq/L (ref 3.5–5.1)
Sodium: 137 mEq/L (ref 135–145)

## 2013-01-27 LAB — MEASLES/MUMPS/RUBELLA IMMUNITY
Mumps IgG: >300 AU/mL — ABNORMAL HIGH (ref <9.00)
Rubella: 14 Index — ABNORMAL HIGH (ref <0.90)

## 2013-01-30 LAB — VARICELLA ZOSTER ANTIBODY, IGM: Varicella Zoster Ab IgM: 0 {ISR} (ref <0.91)

## 2013-02-02 ENCOUNTER — Ambulatory Visit: Payer: Medicare Other | Admitting: Internal Medicine

## 2013-02-06 ENCOUNTER — Ambulatory Visit (INDEPENDENT_AMBULATORY_CARE_PROVIDER_SITE_OTHER): Payer: Medicare Other | Admitting: Internal Medicine

## 2013-02-06 ENCOUNTER — Encounter: Payer: Self-pay | Admitting: Internal Medicine

## 2013-02-06 VITALS — BP 130/70 | HR 84 | Temp 97.8°F | Ht 68.0 in | Wt 172.0 lb

## 2013-02-06 DIAGNOSIS — I739 Peripheral vascular disease, unspecified: Secondary | ICD-10-CM

## 2013-02-06 DIAGNOSIS — Z2911 Encounter for prophylactic immunotherapy for respiratory syncytial virus (RSV): Secondary | ICD-10-CM

## 2013-02-06 DIAGNOSIS — J984 Other disorders of lung: Secondary | ICD-10-CM

## 2013-02-06 DIAGNOSIS — Z23 Encounter for immunization: Secondary | ICD-10-CM

## 2013-02-06 DIAGNOSIS — I10 Essential (primary) hypertension: Secondary | ICD-10-CM

## 2013-02-06 DIAGNOSIS — F4321 Adjustment disorder with depressed mood: Secondary | ICD-10-CM

## 2013-02-06 MED ORDER — CLOPIDOGREL BISULFATE 75 MG PO TABS: 75.0000 mg | ORAL_TABLET | Freq: Every morning | ORAL | Status: DC

## 2013-02-06 MED ORDER — IRBESARTAN-HYDROCHLOROTHIAZIDE 150-12.5 MG PO TABS: 1.0000 | ORAL_TABLET | Freq: Every day | ORAL | Status: DC

## 2013-02-06 MED ORDER — AMLODIPINE BESYLATE 5 MG PO TABS: 5.0000 mg | ORAL_TABLET | Freq: Every day | ORAL | Status: DC

## 2013-02-06 MED ORDER — OMEPRAZOLE 20 MG PO CPDR: 20.0000 mg | DELAYED_RELEASE_CAPSULE | Freq: Two times a day (BID) | ORAL | Status: DC

## 2013-02-06 MED ORDER — ROSUVASTATIN CALCIUM 40 MG PO TABS: 40.0000 mg | ORAL_TABLET | Freq: Every day | ORAL | Status: DC

## 2013-02-06 MED ORDER — ZOSTER VACCINE LIVE 19400 UNT/0.65ML ~~LOC~~ SOLR: 0.6500 mL | Freq: Once | SUBCUTANEOUS | Status: DC

## 2013-02-06 NOTE — Assessment & Plan Note (Signed)
Repeat CT of chest

## 2013-02-06 NOTE — Assessment & Plan Note (Signed)
Continue Crestor and Plavix.  Patient not willing to quit smoking.

## 2013-02-06 NOTE — Assessment & Plan Note (Signed)
Well controlled.  No change in medication regimen.  BP: 130/70 mmHg  Lab Results  Component Value Date   CREATININE 0.9 01/26/2013

## 2013-02-06 NOTE — Assessment & Plan Note (Signed)
Son recently died from lung cancer.  Patient agrees to contact us if he develops depressive symptoms beyond normal grieving.

## 2013-02-06 NOTE — Progress Notes (Signed)
Subjective:    Patient ID: Timothy Wood, male    DOB: 1940/07/22, 72 y.o.   MRN: [id number]  HPI  72 year old white male smoker with PVD, hypertension and COPD for follow up.  Patient grieving loss of his 34 year old son.  He died of lung cancer.   Tobacco abuse - he is not ready to quit.  PVD - he denies claudication symptoms.   Review of Systems    negative for chest pain.  Negative for cough Past Medical History  Diagnosis Date  . Hypertension   . Tobacco abuse   . Pulmonary nodule 06/2006    9 mm right upper obe pulmonary nodule (negative Pet Scan)  . Personal history of colonic polyps 2008    multiple adenomas 2008-2009 exams with large cecal adenoma  . Hyperlipidemia   . PVD (peripheral vascular disease)     left leg s/p stent  . Seborrheic keratosis   . Sebaceous cyst     neck  . Sinusitis   . Blood clotting disorder   . Diverticulosis   . Chronic obstructive pulmonary disease (COPD)   . GERD (gastroesophageal reflux disease)     History   Social History  . Marital Status: Married    Spouse Name: N/A    Number of Children: 2  . Years of Education: N/A   Occupational History  . Retired    Social History Main Topics  . Smoking status: Current Every Day Smoker    Types: Cigarettes  . Smokeless tobacco: Never Used     Comment: 50 pack year history  . Alcohol Use: No  . Drug Use: No  . Sexual Activity: Not on file   Other Topics Concern  . Not on file   Social History Narrative   The patient is happily married.  He lives with his wife.     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     Occasional alcohol.      Tobacco use - over 50 pack years.   Father died at age 63 of suicide.    Past Surgical History  Procedure Laterality Date  . Back surgery  1975, 1978    x2 lumbar fusion over 30 years ago  . Knee surgery  2002    left  . Tonsillectomy  1956  . Angioplasty / stenting iliac  08/2008    Dr. Myra Gianotti  . Descending aortic aneurysm repair  w/ stent  04/2010    Dr. Myra Gianotti  . Colonoscopy  05/11/2006; 07/27/2006;05/05/2007, 11/24/2010    adenomas with 9 polyps index and 2.5 cm cecal polyp removed over 3 exams, last 2009 with APC. diverticulosis also.  2012: 12 adenomas removed, largest 8-10 mm, diverticulosis  . Esophagogastroduodenoscopy  05/05/2007    GERD - one erosion  . Percutaneous pinning Left 06/24/2012    Procedure: Repair Complex Laceration Left Thumb with percutaneous pinning.;  Surgeon: Johnette Abraham, MD;  Location: [facility] OR;  Service: Plastics;  Laterality: Left;    Family History  Problem Relation Age of Onset  . Colon cancer Mother 73  . Heart disease Brother   . Diabetes      Allergies  Allergen Reactions  . Hydrocodone     Current Outpatient Prescriptions on File Prior to Visit  Medication Sig Dispense Refill  . aspirin 325 MG tablet Take 325 mg by mouth every morning.       No current facility-administered medications on file prior to visit.  BP 130/70  Pulse 84  Temp(Src) 97.8 F (36.6 C) (Oral)  Ht 5\' 8"  (1.727 m)  Wt 172 lb (78.019 kg)  BMI 26.16 kg/m2    Objective:   Physical Exam  Constitutional: He is oriented to person, place, and time. He appears well-developed and well-nourished. No distress.  HENT:  Head: Normocephalic and atraumatic.  Mouth/Throat: Oropharynx is clear and moist.  Neck: Neck supple.  No carotid bruit  Cardiovascular: Normal rate, regular rhythm and normal heart sounds.   Diminished lower ext pulses  Pulmonary/Chest: Effort normal and breath sounds normal. He has no wheezes.  Musculoskeletal: He exhibits no edema.  Neurological: He is alert and oriented to person, place, and time. No cranial nerve deficit.  Psychiatric: He has a normal mood and affect. His behavior is normal.          Assessment & Plan:

## 2013-02-06 NOTE — Patient Instructions (Signed)
Please complete the following lab tests before your next follow up appointment: BMET, CBCD - 401.9 FLP, LFTs, TSH - 272.4 

## 2013-02-06 NOTE — Progress Notes (Signed)
Pre visit review using our clinic review tool, if applicable. No additional management support is needed unless otherwise documented below in the visit note. 

## 2013-02-13 ENCOUNTER — Other Ambulatory Visit: Payer: Self-pay | Admitting: *Deleted

## 2013-02-13 MED ORDER — CYCLOBENZAPRINE HCL 5 MG PO TABS: 5.0000 mg | ORAL_TABLET | Freq: Two times a day (BID) | ORAL | Status: DC | PRN

## 2013-02-14 ENCOUNTER — Other Ambulatory Visit: Payer: Medicare Other

## 2013-02-15 ENCOUNTER — Ambulatory Visit
Admission: RE | Admit: 2013-02-15 | Discharge: 2013-02-15 | Disposition: A | Discharge status: Home / Self Care | Payer: Medicare Other | Source: Ambulatory Visit | Attending: Internal Medicine | Admitting: Internal Medicine

## 2013-02-15 DIAGNOSIS — J984 Other disorders of lung: Secondary | ICD-10-CM

## 2013-02-21 ENCOUNTER — Other Ambulatory Visit: Payer: Self-pay | Admitting: Internal Medicine

## 2013-02-21 DIAGNOSIS — R918 Other nonspecific abnormal finding of lung field: Secondary | ICD-10-CM

## 2013-03-03 ENCOUNTER — Ambulatory Visit (INDEPENDENT_AMBULATORY_CARE_PROVIDER_SITE_OTHER): Payer: Medicare Other | Admitting: Internal Medicine

## 2013-03-03 ENCOUNTER — Encounter: Payer: Self-pay | Admitting: Internal Medicine

## 2013-03-03 VITALS — BP 122/70 | HR 65 | Temp 97.4°F | Ht 69.0 in | Wt 173.0 lb

## 2013-03-03 DIAGNOSIS — J984 Other disorders of lung: Secondary | ICD-10-CM

## 2013-03-03 DIAGNOSIS — J449 Chronic obstructive pulmonary disease, unspecified: Secondary | ICD-10-CM | POA: Insufficient documentation

## 2013-03-03 DIAGNOSIS — F17200 Nicotine dependence, unspecified, uncomplicated: Secondary | ICD-10-CM

## 2013-03-03 NOTE — Patient Instructions (Signed)
The key is to stop smoking completely before smoking completely stops you - this is the most important aspect of your care   We will contact you in 4 months for follow up CT chest

## 2013-03-03 NOTE — Assessment & Plan Note (Signed)
Any of these lesions could be ca given smoking hx > agree with rad guidelines for CT 4 months and regroup then

## 2013-03-03 NOTE — Assessment & Plan Note (Signed)
-   spirometry 03/03/2013 FEV1  2.87 ( 86%) ratio 69   I reviewed the Flethcher curve with patient that basically indicates  if you quit smoking when your best day FEV1 is still well preserved it is highly unlikely you will progress to severe disease and informed the patient there was no medication on the market that has proven to change the curve or the likelihood of progression.  Therefore stopping smoking and maintaining abstinence is the most important aspect of care, not choice of inhalers or for that matter, doctors.    No pulmonar f/u needed for this severity of dz

## 2013-03-03 NOTE — Assessment & Plan Note (Signed)
Strongly advised to quit.

## 2013-03-03 NOTE — Progress Notes (Signed)
   Subjective:    Patient ID: Timothy Wood, male    DOB: 09/04/1940  MRN: [id number]  HPI  13 yowm lifetime smoker able to do yardwork and walk dog twice daily s difficulty referred by Dr Artist Pais for [facility]   03/03/2013 1st [facility] office visit/ Wert cc abn CT chest.  He has h/o remote pulmonary nodule in RUL and the repeat CT was ordered for this and found it had not changed but several other lesions had developed in interim (see CTchest below)  Pt denies limiting sob, No obvious day to day or daytime variabilty or assoc chronic cough or cp or chest tightness, subjective wheeze overt sinus or hb symptoms. No unusual exp hx or h/o childhood pna/ asthma or knowledge of premature birth.  Sleeping ok without nocturnal  or early am exacerbation  of respiratory  c/o's or need for noct saba. Also denies any obvious fluctuation of symptoms with weather or environmental changes or other aggravating or alleviating factors except as outlined above   Current Medications, Allergies, Complete Past Medical History, Past Surgical History, Family History, and Social History were reviewed in [employer] record.          Review of Systems  Constitutional: Negative for fever, chills, activity change, appetite change and unexpected weight change.  HENT: Negative for congestion, dental problem, postnasal drip, rhinorrhea, sneezing, sore throat, trouble swallowing and voice change.   Eyes: Negative for visual disturbance.  Respiratory: Negative for cough, choking and shortness of breath.        Heartburn  Cardiovascular: Negative for chest pain and leg swelling.  Gastrointestinal: Negative for nausea, vomiting and abdominal pain.  Genitourinary: Negative for difficulty urinating.  Musculoskeletal: Positive for arthralgias.  Skin: Negative for rash.  Psychiatric/Behavioral: Negative for behavioral problems and confusion.       Objective:   Physical Exam    amb stoic wm  nad  Wt Readings from Last 3 Encounters:  03/03/13 173 lb (78.472 kg)  02/06/13 172 lb (78.019 kg)  08/09/12 169 lb 6.4 oz (76.839 kg)      HEENT mild turbinate edema.  Oropharynx no thrush or excess pnd or cobblestoning.  No JVD or cervical adenopathy. Mild accessory muscle hypertrophy. Trachea midline, nl thryroid. Chest was hyperinflated by percussion with diminished breath sounds and moderate increased exp time without wheeze. Hoover sign positive at late  inspiration. Regular rate and rhythm without murmur gallop or rub or increase P2 or edema.  Abd: no hsm, nl excursion. Ext warm without cyanosis / ? Very subtle clubbing     02/21/13  CT chest  1. Stable somewhat dense 7 mm nodule in the right upper lobe most  consistent with a benign process.  2. However, there are 2 ground-glass opacities, 1 in the right upper  lobe, and a 2nd in the right middle lobe, worrisome for slow growing  low-grade adenocarcinoma. Recommend followup CT the chest in 4  months.  3. In addition, there is a 5 mm nodule in the right upper lobe, not  visible on prior CT from 2008, suspicious for slow-growing lung  carcinoma as well, and followup CT in 4 months is recommended.      Assessment & Plan:

## 2013-06-22 ENCOUNTER — Telehealth: Payer: Self-pay | Admitting: *Deleted

## 2013-06-22 DIAGNOSIS — J449 Chronic obstructive pulmonary disease, unspecified: Secondary | ICD-10-CM

## 2013-06-22 NOTE — Telephone Encounter (Signed)
Message copied by Timothy Wood on Thu Jun 22, 2013  3:03 PM ------      Message from: Christinia Gully B      Created: Fri Mar 03, 2013  9:47 AM       Needs f/u cT chest no contrast  ------

## 2013-07-03 ENCOUNTER — Ambulatory Visit (INDEPENDENT_AMBULATORY_CARE_PROVIDER_SITE_OTHER)
Admission: RE | Admit: 2013-07-03 | Discharge: 2013-07-03 | Disposition: A | Discharge status: Home / Self Care | Payer: Private Health Insurance - Indemnity | Source: Ambulatory Visit | Attending: Internal Medicine | Admitting: Internal Medicine

## 2013-07-03 ENCOUNTER — Telehealth: Payer: Self-pay | Admitting: Internal Medicine

## 2013-07-03 ENCOUNTER — Encounter: Payer: Self-pay | Admitting: Internal Medicine

## 2013-07-03 DIAGNOSIS — J449 Chronic obstructive pulmonary disease, unspecified: Secondary | ICD-10-CM

## 2013-07-03 NOTE — Telephone Encounter (Signed)
Notes Recorded by Tanda Rockers, MD on 07/03/2013 at 1:08 PM Call patient : Study is unremarkable - no change from prev study, radiology recs f/u ct one year and I agree (placed in tickle    I spoke with patient about results and he verbalized understanding and had no questions

## 2013-07-03 NOTE — Progress Notes (Signed)
Quick Note:  LMTCB ______ 

## 2013-07-10 ENCOUNTER — Encounter: Payer: Self-pay | Admitting: Internal Medicine

## 2013-07-10 ENCOUNTER — Ambulatory Visit (INDEPENDENT_AMBULATORY_CARE_PROVIDER_SITE_OTHER): Payer: Private Health Insurance - Indemnity | Admitting: Internal Medicine

## 2013-07-10 VITALS — BP 118/70 | Temp 97.8°F | Wt 168.0 lb

## 2013-07-10 DIAGNOSIS — L02239 Carbuncle of trunk, unspecified: Secondary | ICD-10-CM

## 2013-07-10 DIAGNOSIS — L02229 Furuncle of trunk, unspecified: Secondary | ICD-10-CM

## 2013-07-10 DIAGNOSIS — L02222 Furuncle of back [any part, except buttock]: Secondary | ICD-10-CM

## 2013-07-10 MED ORDER — CLINDAMYCIN HCL 150 MG PO CAPS: 150.0000 mg | ORAL_CAPSULE | Freq: Three times a day (TID) | ORAL | Status: DC

## 2013-07-10 MED ORDER — SULFAMETHOXAZOLE-TMP DS 800-160 MG PO TABS: 1.0000 | ORAL_TABLET | Freq: Two times a day (BID) | ORAL | Status: DC

## 2013-07-10 NOTE — Progress Notes (Signed)
Pre visit review using our clinic review tool, if applicable. No additional management support is needed unless otherwise documented below in the visit note. 

## 2013-07-10 NOTE — Assessment & Plan Note (Signed)
73 year old with 2-3 cm boil on right upper back. Area cleaned with Betadine solution. 1% lidocaine with epi 0.3 cc used as anesthetic.  11 blade use for incision and drainage. Purulent material sent for culture. Treat with Bactrim DS twice daily for 10 days. Purulent drainage with foul odor suggestive of polymicrobial infection. Add clindamycin 150 mg 3 times a day for 10 days.

## 2013-07-10 NOTE — Patient Instructions (Signed)
Contact our office if symptoms worsen, or you develop fever.

## 2013-07-10 NOTE — Progress Notes (Signed)
Subjective:    Patient ID: Timothy Wood, male    DOB: 1941/02/06, 73 y.o.   MRN: [id number]  HPI  73 year old white male with history of peripheral vascular disease, hypertension, COPD and ongoing tobacco use complains of boil on right lower back for 2 weeks. He denies fever or chills. Area is tender to touch.  Interval hx - wife diagnosed with stage 4 lung cancer.  Review of Systems Negative for fever or chills    Past Medical History  Diagnosis Date  . Hypertension   . Tobacco abuse   . Pulmonary nodule 06/2006    9 mm right upper obe pulmonary nodule (negative Pet Scan)  . Personal history of colonic polyps 2008    multiple adenomas 2008-2009 exams with large cecal adenoma  . Hyperlipidemia   . PVD (peripheral vascular disease)     left leg s/p stent  . Seborrheic keratosis   . Sebaceous cyst     neck  . Sinusitis   . Blood clotting disorder   . Diverticulosis   . Chronic obstructive pulmonary disease (COPD)   . GERD (gastroesophageal reflux disease)     History   Social History  . Marital Status: Married    Spouse Name: N/A    Number of Children: 2  . Years of Education: N/A   Occupational History  . Retired    Social History Main Topics  . Smoking status: Current Every Day Smoker -- 0.75 packs/day for 45 years    Types: Cigarettes  . Smokeless tobacco: Never Used  . Alcohol Use: No  . Drug Use: No  . Sexual Activity: Not on file   Other Topics Concern  . Not on file   Social History Narrative   The patient is happily married.  He lives with his wife.     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     Occasional alcohol.      Tobacco use - over 50 pack years.   Father died at age 9 of suicide.    Past Surgical History  Procedure Laterality Date  . Back surgery  1975, 1978    x2 lumbar fusion over 30 years ago  . Knee surgery  2002    left  . Tonsillectomy  1956  . Angioplasty / stenting iliac  08/2008    Dr. Trula Slade  . Descending  aortic aneurysm repair w/ stent  04/2010    Dr. Trula Slade  . Colonoscopy  05/11/2006; 07/27/2006;05/05/2007, 11/24/2010    adenomas with 9 polyps index and 2.5 cm cecal polyp removed over 3 exams, last 2009 with APC. diverticulosis also.  2012: 12 adenomas removed, largest 8-10 mm, diverticulosis  . Esophagogastroduodenoscopy  05/05/2007    GERD - one erosion  . Percutaneous pinning Left 06/24/2012    Procedure: Repair Complex Laceration Left Thumb with percutaneous pinning.;  Surgeon: Dennie Bible, MD;  Location: [facility];  Service: Plastics;  Laterality: Left;    Family History  Problem Relation Age of Onset  . Colon cancer Mother 50  . Heart disease Brother   . Diabetes    . Lung cancer Son     smoked and was a Building control surveyor  . Heart disease Brother   . Deep vein thrombosis Mother   . Rheum arthritis Daughter   . Rheum arthritis Sister     Allergies  Allergen Reactions  . Hydrocodone     Current Outpatient Prescriptions on File Prior to Visit  Medication Sig Dispense  Refill  . amLODipine (NORVASC) 5 MG tablet Take 1 tablet (5 mg total) by mouth daily.  90 tablet  3  . aspirin 325 MG tablet Take 325 mg by mouth every morning.      . clopidogrel (PLAVIX) 75 MG tablet Take 1 tablet (75 mg total) by mouth every morning.  90 tablet  3  . cyclobenzaprine (FLEXERIL) 5 MG tablet Take 1 tablet (5 mg total) by mouth 2 (two) times daily as needed for muscle spasms.  60 tablet  3  . irbesartan-hydrochlorothiazide (AVALIDE) 150-12.5 MG per tablet Take 1 tablet by mouth daily.  90 tablet  3  . omeprazole (PRILOSEC) 20 MG capsule Take 20 mg by mouth daily.      . rosuvastatin (CRESTOR) 40 MG tablet Take 1 tablet (40 mg total) by mouth daily.  90 tablet  3   No current facility-administered medications on file prior to visit.    BP 118/70  Temp(Src) 97.8 F (36.6 C) (Oral)  Wt 168 lb (76.204 kg)    Objective:   Physical Exam  Constitutional: He appears well-developed and well-nourished.    Cardiovascular: Normal rate, regular rhythm and normal heart sounds.   Pulmonary/Chest: Effort normal and breath sounds normal. He has no wheezes.  Skin: Skin is warm and dry.  2 -3 cm abscess (right upper lumbar paraspinal area) draining foul smelling pus       Assessment & Plan:

## 2013-07-11 ENCOUNTER — Telehealth: Payer: Self-pay | Admitting: Internal Medicine

## 2013-07-11 NOTE — Telephone Encounter (Signed)
Relevant patient education mailed to patient.  

## 2013-07-12 LAB — WOUND CULTURE
Gram Stain: NONE SEEN
Gram Stain: NONE SEEN
Organism ID, Bacteria: NO GROWTH

## 2013-07-17 ENCOUNTER — Telehealth: Payer: Self-pay | Admitting: Internal Medicine

## 2013-07-17 NOTE — Telephone Encounter (Signed)
Pt need a follow up on a boil that was lance may i use the SDA for Wednesday 07/19/13

## 2013-07-17 NOTE — Telephone Encounter (Signed)
Thanks!!!!  pt scheduled

## 2013-07-17 NOTE — Telephone Encounter (Signed)
ok 

## 2013-07-19 ENCOUNTER — Ambulatory Visit (INDEPENDENT_AMBULATORY_CARE_PROVIDER_SITE_OTHER): Payer: Private Health Insurance - Indemnity | Admitting: Internal Medicine

## 2013-07-19 ENCOUNTER — Encounter: Payer: Self-pay | Admitting: Internal Medicine

## 2013-07-19 VITALS — BP 112/62 | Temp 98.4°F | Ht 69.0 in | Wt 165.0 lb

## 2013-07-19 DIAGNOSIS — L02239 Carbuncle of trunk, unspecified: Secondary | ICD-10-CM

## 2013-07-19 DIAGNOSIS — L0292 Furuncle, unspecified: Secondary | ICD-10-CM

## 2013-07-19 DIAGNOSIS — L0293 Carbuncle, unspecified: Secondary | ICD-10-CM

## 2013-07-19 DIAGNOSIS — F17200 Nicotine dependence, unspecified, uncomplicated: Secondary | ICD-10-CM

## 2013-07-19 DIAGNOSIS — L02229 Furuncle of trunk, unspecified: Secondary | ICD-10-CM

## 2013-07-19 MED ORDER — CLINDAMYCIN HCL 300 MG PO CAPS: 300.0000 mg | ORAL_CAPSULE | Freq: Three times a day (TID) | ORAL | Status: DC

## 2013-07-19 NOTE — Assessment & Plan Note (Signed)
He understands that tobacco use will hinder wound healing.

## 2013-07-19 NOTE — Progress Notes (Signed)
Pre visit review using our clinic review tool, if applicable. No additional management support is needed unless otherwise documented below in the visit note. 

## 2013-07-19 NOTE — Assessment & Plan Note (Signed)
Right upper lumbar abscess/boil getting larger and deeper.  Expressed "cheesy curd" like material from abscess.  Abscess packed with iodoform gauze. Refer to general surgeon for further evaluation and treatment.  He may require further debridement.  No fever or constitutional symptoms but if abscess gets deeper, he may require IV abx.  Finish Bactrim DS.  Increase Clindamycin to 300 mg tid for additional 7 days considering foul odor which may be attributable to anaerobic/polymicrobial infection.

## 2013-07-19 NOTE — Progress Notes (Signed)
Subjective:    Patient ID: Timothy Wood, male    DOB: 06-08-1940, 73 y.o.   MRN: [id number]  HPI  73 year old white male with history of hypertension and chronic tobacco use for followup regarding right lumbar abscess/boil. Patient was last seen on 07/10/2013. Incision and drainage was performed. Specimen was sent for culture but unfortunately there was no growth.  Patient has been taking Bactrim DS twice daily and clindamycin 150 mg 3 times a day (Day 8 of antibiotic therapy)  Patient reports boil has gotten larger. He denies any fever or chills. It has been  draining purulent material.  He has been changing his dressings as directed.  Review of Systems Negative for fever or chills    Past Medical History  Diagnosis Date  . Hypertension   . Tobacco abuse   . Pulmonary nodule 06/2006    9 mm right upper obe pulmonary nodule (negative Pet Scan)  . Personal history of colonic polyps 2008    multiple adenomas 2008-2009 exams with large cecal adenoma  . Hyperlipidemia   . PVD (peripheral vascular disease)     left leg s/p stent  . Seborrheic keratosis   . Sebaceous cyst     neck  . Sinusitis   . Blood clotting disorder   . Diverticulosis   . Chronic obstructive pulmonary disease (COPD)   . GERD (gastroesophageal reflux disease)     History   Social History  . Marital Status: Married    Spouse Name: N/A    Number of Children: 2  . Years of Education: N/A   Occupational History  . Retired    Social History Main Topics  . Smoking status: Current Every Day Smoker -- 0.75 packs/day for 45 years    Types: Cigarettes  . Smokeless tobacco: Never Used  . Alcohol Use: No  . Drug Use: No  . Sexual Activity: Not on file   Other Topics Concern  . Not on file   Social History Narrative   The patient is happily married.  He lives with his wife.     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     Occasional alcohol.      Tobacco use - over 50 pack years.   Father died at  age 33 of suicide.    Past Surgical History  Procedure Laterality Date  . Back surgery  1975, 1978    x2 lumbar fusion over 30 years ago  . Knee surgery  2002    left  . Tonsillectomy  1956  . Angioplasty / stenting iliac  08/2008    Dr. Trula Slade  . Descending aortic aneurysm repair w/ stent  04/2010    Dr. Trula Slade  . Colonoscopy  05/11/2006; 07/27/2006;05/05/2007, 11/24/2010    adenomas with 9 polyps index and 2.5 cm cecal polyp removed over 3 exams, last 2009 with APC. diverticulosis also.  2012: 12 adenomas removed, largest 8-10 mm, diverticulosis  . Esophagogastroduodenoscopy  05/05/2007    GERD - one erosion  . Percutaneous pinning Left 06/24/2012    Procedure: Repair Complex Laceration Left Thumb with percutaneous pinning.;  Surgeon: Dennie Bible, MD;  Location: [facility];  Service: Plastics;  Laterality: Left;    Family History  Problem Relation Age of Onset  . Colon cancer Mother 39  . Heart disease Brother   . Diabetes    . Lung cancer Son     smoked and was a Building control surveyor  . Heart disease Brother   .  Deep vein thrombosis Mother   . Rheum arthritis Daughter   . Rheum arthritis Sister     Allergies  Allergen Reactions  . Hydrocodone     Current Outpatient Prescriptions on File Prior to Visit  Medication Sig Dispense Refill  . amLODipine (NORVASC) 5 MG tablet Take 1 tablet (5 mg total) by mouth daily.  90 tablet  3  . aspirin 325 MG tablet Take 325 mg by mouth every morning.      . clindamycin (CLEOCIN) 150 MG capsule Take 1 capsule (150 mg total) by mouth 3 (three) times daily.  30 capsule  0  . clopidogrel (PLAVIX) 75 MG tablet Take 1 tablet (75 mg total) by mouth every morning.  90 tablet  3  . cyclobenzaprine (FLEXERIL) 5 MG tablet Take 1 tablet (5 mg total) by mouth 2 (two) times daily as needed for muscle spasms.  60 tablet  3  . irbesartan-hydrochlorothiazide (AVALIDE) 150-12.5 MG per tablet Take 1 tablet by mouth daily.  90 tablet  3  . omeprazole (PRILOSEC) 20 MG  capsule Take 20 mg by mouth daily.      . rosuvastatin (CRESTOR) 40 MG tablet Take 1 tablet (40 mg total) by mouth daily.  90 tablet  3  . sulfamethoxazole-trimethoprim (BACTRIM DS) 800-160 MG per tablet Take 1 tablet by mouth 2 (two) times daily.  20 tablet  0   No current facility-administered medications on file prior to visit.    BP 112/62  Temp(Src) 98.4 F (36.9 C) (Oral)  Ht 5\' 9"  (1.753 m)  Wt 165 lb (74.844 kg)  BMI 24.36 kg/m2    Objective:   Physical Exam  Constitutional: He appears well-developed and well-nourished.  Cardiovascular: Normal rate, regular rhythm and normal heart sounds.   No murmur heard. Pulmonary/Chest: Effort normal and breath sounds normal. He has no wheezes.  Skin:  3 - 4 cm open abscess/wound right upper back,  draining purulent foul smelling material.  Surrounding tenderness          Assessment & Plan:

## 2013-07-20 ENCOUNTER — Telehealth: Payer: Self-pay | Admitting: Internal Medicine

## 2013-07-20 NOTE — Telephone Encounter (Signed)
Relevant patient education assigned to patient using Emmi. ° °

## 2013-07-21 ENCOUNTER — Encounter (INDEPENDENT_AMBULATORY_CARE_PROVIDER_SITE_OTHER): Payer: Self-pay | Admitting: General Surgery

## 2013-07-21 ENCOUNTER — Ambulatory Visit (INDEPENDENT_AMBULATORY_CARE_PROVIDER_SITE_OTHER): Payer: Medicare HMO | Admitting: General Surgery

## 2013-07-21 VITALS — BP 130/72 | HR 78 | Temp 97.7°F | Resp 14 | Ht 69.0 in | Wt 162.6 lb

## 2013-07-21 DIAGNOSIS — L723 Sebaceous cyst: Secondary | ICD-10-CM

## 2013-07-21 DIAGNOSIS — L089 Local infection of the skin and subcutaneous tissue, unspecified: Secondary | ICD-10-CM

## 2013-07-21 NOTE — Assessment & Plan Note (Signed)
Repacked.   Will likely need excision once the infection has healed  Will recheck in around 6 weeks.

## 2013-07-21 NOTE — Patient Instructions (Signed)
Follow up in 6 weeks.    Pull out packing in 48 hours.

## 2013-07-21 NOTE — Progress Notes (Signed)
Patient ID: Timothy Wood, male   DOB: 1940-09-22, 73 y.o.   MRN: [id number]  Chief Complaint  Patient presents with  . New Evaluation    eval abscess low to mid back    HPI Timothy Wood is a 73 y.o. male.   HPI  Pt is a 73 yo male with a long history of a sebaceous cyst on his back.  It recently became painful and started draining.  Dr. Shawna Orleans opened it up and debrided it.  It is still a bit sore.  He denies fever/chills.  He is still having some drainage.    Past Medical History  Diagnosis Date  . Hypertension   . Tobacco abuse   . Pulmonary nodule 06/2006    9 mm right upper obe pulmonary nodule (negative Pet Scan)  . Personal history of colonic polyps 2008    multiple adenomas 2008-2009 exams with large cecal adenoma  . Hyperlipidemia   . PVD (peripheral vascular disease)     left leg s/p stent  . Seborrheic keratosis   . Sebaceous cyst     neck  . Sinusitis   . Blood clotting disorder   . Diverticulosis   . Chronic obstructive pulmonary disease (COPD)   . GERD (gastroesophageal reflux disease)     Past Surgical History  Procedure Laterality Date  . Back surgery  1975, 1978    x2 lumbar fusion over 30 years ago  . Knee surgery  2002    left  . Tonsillectomy  1956  . Angioplasty / stenting iliac  08/2008    Dr. Trula Slade  . Descending aortic aneurysm repair w/ stent  04/2010    Dr. Trula Slade  . Colonoscopy  05/11/2006; 07/27/2006;05/05/2007, 11/24/2010    adenomas with 9 polyps index and 2.5 cm cecal polyp removed over 3 exams, last 2009 with APC. diverticulosis also.  2012: 12 adenomas removed, largest 8-10 mm, diverticulosis  . Esophagogastroduodenoscopy  05/05/2007    GERD - one erosion  . Percutaneous pinning Left 06/24/2012    Procedure: Repair Complex Laceration Left Thumb with percutaneous pinning.;  Surgeon: Dennie Bible, MD;  Location: [facility];  Service: Plastics;  Laterality: Left;    Family History  Problem Relation Age of Onset  . Colon cancer Mother 67    . Heart disease Brother   . Diabetes    . Lung cancer Son     smoked and was a Building control surveyor  . Heart disease Brother   . Deep vein thrombosis Mother   . Rheum arthritis Daughter   . Rheum arthritis Sister     Social History History  Substance Use Topics  . Smoking status: Current Every Day Smoker -- 0.75 packs/day for 45 years    Types: Cigarettes  . Smokeless tobacco: Never Used  . Alcohol Use: No    Allergies  Allergen Reactions  . Hydrocodone     Current Outpatient Prescriptions  Medication Sig Dispense Refill  . amLODipine (NORVASC) 5 MG tablet Take 1 tablet (5 mg total) by mouth daily.  90 tablet  3  . aspirin 325 MG tablet Take 325 mg by mouth every morning.      . clindamycin (CLEOCIN) 300 MG capsule Take 1 capsule (300 mg total) by mouth 3 (three) times daily.  21 capsule  0  . cyclobenzaprine (FLEXERIL) 5 MG tablet Take 1 tablet (5 mg total) by mouth 2 (two) times daily as needed for muscle spasms.  60 tablet  3  . irbesartan-hydrochlorothiazide (  AVALIDE) 150-12.5 MG per tablet Take 1 tablet by mouth daily.  90 tablet  3  . omeprazole (PRILOSEC) 20 MG capsule Take 20 mg by mouth daily.      . rosuvastatin (CRESTOR) 40 MG tablet Take 1 tablet (40 mg total) by mouth daily.  90 tablet  3  . sulfamethoxazole-trimethoprim (BACTRIM DS) 800-160 MG per tablet Take 1 tablet by mouth 2 (two) times daily.  20 tablet  0  . clopidogrel (PLAVIX) 75 MG tablet Take 1 tablet (75 mg total) by mouth every morning.  90 tablet  3   No current facility-administered medications for this visit.    Review of Systems Review of Systems  HENT: Negative.   Eyes: Negative.   Respiratory: Negative.   Cardiovascular: Negative.   Gastrointestinal: Negative.   Endocrine: Negative.   Genitourinary: Negative.   Musculoskeletal: Negative.   Skin: Positive for wound (open wound on back.  ).  Allergic/Immunologic: Negative.   Hematological: Negative.   Psychiatric/Behavioral: Negative.     Blood  pressure 130/72, pulse 78, temperature 97.7 F (36.5 C), temperature source Temporal, resp. rate 14, height 5\' 9"  (1.753 m), weight 162 lb 9.6 oz (73.755 kg).  Physical Exam Physical Exam  Constitutional: He is oriented to person, place, and time. He appears well-developed and well-nourished. No distress.  HENT:  Head: Normocephalic and atraumatic.  Right Ear: External ear normal.  Left Ear: External ear normal.  Eyes: Conjunctivae are normal. Pupils are equal, round, and reactive to light. Right eye exhibits no discharge. Left eye exhibits no discharge. No scleral icterus.  Cardiovascular: Normal rate and regular rhythm.   Pulmonary/Chest: Effort normal. No respiratory distress.  Abdominal: Soft. He exhibits no distension. There is no tenderness.  Musculoskeletal: Normal range of motion.  Neurological: He is alert and oriented to person, place, and time. Coordination normal.  Skin: Skin is warm and dry. He is not diaphoretic.     Fibrinous exudate debrided.  Some additional sebaceous cyst material  Psychiatric: He has a normal mood and affect. His behavior is normal. Judgment and thought content normal.     Assessment/Plan     Infected sebaceous cyst of back Repacked.   Will likely need excision once the infection has healed  Will recheck in around 6 weeks.        Stark Klein 07/21/2013, 9:41 AM

## 2013-08-03 ENCOUNTER — Encounter: Payer: Self-pay | Admitting: Internal Medicine

## 2013-08-03 ENCOUNTER — Ambulatory Visit (INDEPENDENT_AMBULATORY_CARE_PROVIDER_SITE_OTHER): Payer: Private Health Insurance - Indemnity | Admitting: Internal Medicine

## 2013-08-03 VITALS — BP 126/74 | Temp 97.9°F | Ht 69.0 in | Wt 165.0 lb

## 2013-08-03 DIAGNOSIS — L723 Sebaceous cyst: Secondary | ICD-10-CM

## 2013-08-03 DIAGNOSIS — L089 Local infection of the skin and subcutaneous tissue, unspecified: Secondary | ICD-10-CM

## 2013-08-03 NOTE — Assessment & Plan Note (Signed)
Improved.  Appreciate surgical consult.  Patient understands he may require excision once infection healed.

## 2013-08-03 NOTE — Progress Notes (Signed)
Pre visit review using our clinic review tool, if applicable. No additional management support is needed unless otherwise documented below in the visit note. 

## 2013-08-03 NOTE — Progress Notes (Signed)
Subjective:    Patient ID: Timothy Wood, male    DOB: [birth year], 73 y.o.   MRN: [id number]  HPI  73 year old white male for followup regarding right lumbar abscess. Patient was seen by surgery. They have continued to pack the wound.  He finished course of clindamycin. Patient reports soreness of his back has significantly improved. He denies fever or chills.  Review of Systems Negative for diarrhea    Past Medical History  Diagnosis Date  . Hypertension   . Tobacco abuse   . Pulmonary nodule 06/2006    9 mm right upper obe pulmonary nodule (negative Pet Scan)  . Personal history of colonic polyps 2008    multiple adenomas 2008-2009 exams with large cecal adenoma  . Hyperlipidemia   . PVD (peripheral vascular disease)     left leg s/p stent  . Seborrheic keratosis   . Sebaceous cyst     neck  . Sinusitis   . Blood clotting disorder   . Diverticulosis   . Chronic obstructive pulmonary disease (COPD)   . GERD (gastroesophageal reflux disease)     History   Social History  . Marital Status: Married    Spouse Name: N/A    Number of Children: 2  . Years of Education: N/A   Occupational History  . Retired    Social History Main Topics  . Smoking status: Current Every Day Smoker -- 0.75 packs/day for 45 years    Types: Cigarettes  . Smokeless tobacco: Never Used  . Alcohol Use: No  . Drug Use: No  . Sexual Activity: Not on file   Other Topics Concern  . Not on file   Social History Narrative   The patient is happily married.  He lives with his wife.     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     Occasional alcohol.      Tobacco use - over 50 pack years.   Father died at age 105 of suicide.    Past Surgical History  Procedure Laterality Date  . Back surgery  1975, 1978    x2 lumbar fusion over 30 years ago  . Knee surgery  2002    left  . Tonsillectomy  1956  . Angioplasty / stenting iliac  08/2008    Dr. Trula Slade  . Descending aortic aneurysm  repair w/ stent  04/2010    Dr. Trula Slade  . Colonoscopy  05/11/2006; 07/27/2006;05/05/2007, 11/24/2010    adenomas with 9 polyps index and 2.5 cm cecal polyp removed over 3 exams, last 2009 with APC. diverticulosis also.  2012: 12 adenomas removed, largest 8-10 mm, diverticulosis  . Esophagogastroduodenoscopy  05/05/2007    GERD - one erosion  . Percutaneous pinning Left 06/24/2012    Procedure: Repair Complex Laceration Left Thumb with percutaneous pinning.;  Surgeon: Dennie Bible, MD;  Location: [facility];  Service: Plastics;  Laterality: Left;    Family History  Problem Relation Age of Onset  . Colon cancer Mother 64  . Heart disease Brother   . Diabetes    . Lung cancer Son     smoked and was a Building control surveyor  . Heart disease Brother   . Deep vein thrombosis Mother   . Rheum arthritis Daughter   . Rheum arthritis Sister     Allergies  Allergen Reactions  . Hydrocodone     Current Outpatient Prescriptions on File Prior to Visit  Medication Sig Dispense Refill  . amLODipine (NORVASC) 5 MG  tablet Take 1 tablet (5 mg total) by mouth daily.  90 tablet  3  . aspirin 325 MG tablet Take 325 mg by mouth every morning.      . clindamycin (CLEOCIN) 300 MG capsule Take 1 capsule (300 mg total) by mouth 3 (three) times daily.  21 capsule  0  . clopidogrel (PLAVIX) 75 MG tablet Take 1 tablet (75 mg total) by mouth every morning.  90 tablet  3  . cyclobenzaprine (FLEXERIL) 5 MG tablet Take 1 tablet (5 mg total) by mouth 2 (two) times daily as needed for muscle spasms.  60 tablet  3  . irbesartan-hydrochlorothiazide (AVALIDE) 150-12.5 MG per tablet Take 1 tablet by mouth daily.  90 tablet  3  . omeprazole (PRILOSEC) 20 MG capsule Take 20 mg by mouth daily.      . rosuvastatin (CRESTOR) 40 MG tablet Take 1 tablet (40 mg total) by mouth daily.  90 tablet  3  . sulfamethoxazole-trimethoprim (BACTRIM DS) 800-160 MG per tablet Take 1 tablet by mouth 2 (two) times daily.  20 tablet  0   No current  facility-administered medications on file prior to visit.    BP 126/74  Temp(Src) 97.9 F (36.6 C) (Oral)  Ht 5\' 9"  (1.753 m)  Wt 165 lb (74.844 kg)  BMI 24.36 kg/m2    Objective:   Physical Exam  Constitutional: He appears well-developed and well-nourished.  Cardiovascular: Normal rate, regular rhythm and normal heart sounds.   Pulmonary/Chest: Effort normal and breath sounds normal. He has no wheezes.  Skin:  Small open wound right lumbar spine.  No purulent drainage.  No odor          Assessment & Plan:

## 2013-08-04 ENCOUNTER — Other Ambulatory Visit: Payer: Self-pay | Admitting: *Deleted

## 2013-08-04 ENCOUNTER — Ambulatory Visit: Payer: Private Health Insurance - Indemnity | Admitting: Internal Medicine

## 2013-08-04 MED ORDER — PANTOPRAZOLE SODIUM 40 MG PO TBEC: 40.0000 mg | DELAYED_RELEASE_TABLET | Freq: Every day | ORAL | Status: DC

## 2013-09-01 ENCOUNTER — Ambulatory Visit (INDEPENDENT_AMBULATORY_CARE_PROVIDER_SITE_OTHER): Payer: Medicare HMO | Admitting: General Surgery

## 2013-09-01 ENCOUNTER — Encounter (INDEPENDENT_AMBULATORY_CARE_PROVIDER_SITE_OTHER): Payer: Self-pay | Admitting: General Surgery

## 2013-09-01 VITALS — BP 155/80 | HR 80 | Temp 97.0°F | Resp 16 | Ht 69.0 in | Wt 169.0 lb

## 2013-09-01 DIAGNOSIS — L723 Sebaceous cyst: Secondary | ICD-10-CM

## 2013-09-01 DIAGNOSIS — L089 Local infection of the skin and subcutaneous tissue, unspecified: Secondary | ICD-10-CM

## 2013-09-01 NOTE — Assessment & Plan Note (Signed)
Resolved.   This is the only time this has ever been a problem.    I recommend follow up as needed.  We discussed elective excision.  He will call if the cyst recurs.

## 2013-09-01 NOTE — Progress Notes (Signed)
Subjective:     Patient ID: Timothy Wood, male   DOB: Feb 01, 1941, 73 y.o.   MRN: [id number]  HPI Patient is a 73 year old male I saw approximately a month ago for a infected sebaceous cyst of the back. This had some spontaneous drainage and then opened up by his primary care physician. I did debride some additional sebaceous material from the cyst. I also discussed her. He is no longer having any problems. He denies any pain. He is not having any drainage.  Review of Systems  All other systems reviewed and are negative.      Objective:   Physical Exam  Constitutional: He is oriented to person, place, and time. He appears well-developed and well-nourished. No distress.  HENT:  Head: Normocephalic and atraumatic.  Eyes: Conjunctivae are normal. No scleral icterus.  Cardiovascular: Normal rate.   Pulmonary/Chest: Effort normal. No respiratory distress.    Scabbed over area without erythema or drainage.  Around 1 cm in diameter.    Neurological: He is alert and oriented to person, place, and time. Coordination normal.  Skin: Skin is warm and dry. No rash noted. He is not diaphoretic. No erythema. No pallor.  Psychiatric: He has a normal mood and affect. His behavior is normal. Judgment and thought content normal.       Assessment/Plan:     Infected sebaceous cyst of back Resolved.   This is the only time this has ever been a problem.    I recommend follow up as needed.  We discussed elective excision.  He will call if the cyst recurs.

## 2013-09-12 ENCOUNTER — Other Ambulatory Visit: Payer: Self-pay | Admitting: *Deleted

## 2014-02-20 ENCOUNTER — Other Ambulatory Visit: Payer: Self-pay | Admitting: Internal Medicine

## 2014-03-02 ENCOUNTER — Other Ambulatory Visit: Payer: Self-pay | Admitting: Internal Medicine

## 2014-04-09 ENCOUNTER — Other Ambulatory Visit: Payer: Self-pay | Admitting: Internal Medicine

## 2014-05-24 ENCOUNTER — Other Ambulatory Visit: Payer: Self-pay | Admitting: Internal Medicine

## 2014-06-01 ENCOUNTER — Other Ambulatory Visit: Payer: Self-pay | Admitting: Internal Medicine

## 2014-06-12 ENCOUNTER — Other Ambulatory Visit: Payer: Self-pay | Admitting: Internal Medicine

## 2014-06-12 DIAGNOSIS — R918 Other nonspecific abnormal finding of lung field: Secondary | ICD-10-CM

## 2014-06-27 ENCOUNTER — Other Ambulatory Visit: Payer: Self-pay | Admitting: Internal Medicine

## 2014-06-29 ENCOUNTER — Ambulatory Visit (INDEPENDENT_AMBULATORY_CARE_PROVIDER_SITE_OTHER): Payer: Medicare HMO | Admitting: Internal Medicine

## 2014-06-29 ENCOUNTER — Encounter: Payer: Self-pay | Admitting: Internal Medicine

## 2014-06-29 VITALS — BP 134/72 | HR 63 | Temp 97.7°F | Ht 69.0 in | Wt 165.0 lb

## 2014-06-29 DIAGNOSIS — I739 Peripheral vascular disease, unspecified: Secondary | ICD-10-CM

## 2014-06-29 DIAGNOSIS — R918 Other nonspecific abnormal finding of lung field: Secondary | ICD-10-CM

## 2014-06-29 DIAGNOSIS — E785 Hyperlipidemia, unspecified: Secondary | ICD-10-CM

## 2014-06-29 DIAGNOSIS — I10 Essential (primary) hypertension: Secondary | ICD-10-CM

## 2014-06-29 DIAGNOSIS — F4321 Adjustment disorder with depressed mood: Secondary | ICD-10-CM

## 2014-06-29 LAB — LIPID PANEL
CHOL/HDL RATIO: 4
Cholesterol: 136 mg/dL (ref 0–200)
HDL: 38.4 mg/dL — ABNORMAL LOW (ref >39.00)
LDL Cholesterol: 67 mg/dL (ref 0–99)
NonHDL: 97.6
Triglycerides: 155 mg/dL — ABNORMAL HIGH (ref 0.0–149.0)
VLDL: 31 mg/dL (ref 0.0–40.0)

## 2014-06-29 LAB — BASIC METABOLIC PANEL
BUN: 15 mg/dL (ref 6–23)
CALCIUM: 9.6 mg/dL (ref 8.4–10.5)
CO2: 27 meq/L (ref 19–32)
CREATININE: 0.82 mg/dL (ref 0.40–1.50)
Chloride: 102 mEq/L (ref 96–112)
GFR: 97.8 mL/min (ref >60.00)
Glucose, Bld: 92 mg/dL (ref 70–99)
Potassium: 3.8 mEq/L (ref 3.5–5.1)
Sodium: 136 mEq/L (ref 135–145)

## 2014-06-29 LAB — CBC WITH DIFFERENTIAL/PLATELET
Basophils Absolute: 0 10*3/uL (ref 0.0–0.1)
Basophils Relative: 0.5 % (ref 0.0–3.0)
Eosinophils Absolute: 0.1 10*3/uL (ref 0.0–0.7)
Eosinophils Relative: 1.1 % (ref 0.0–5.0)
HCT: 46.5 % (ref 39.0–52.0)
HEMOGLOBIN: 16 g/dL (ref 13.0–17.0)
LYMPHS PCT: 20.6 % (ref 12.0–46.0)
Lymphs Abs: 1.6 10*3/uL (ref 0.7–4.0)
MCHC: 34.4 g/dL (ref 30.0–36.0)
MCV: 91.9 fl (ref 78.0–100.0)
MONOS PCT: 9.3 % (ref 3.0–12.0)
Monocytes Absolute: 0.7 10*3/uL (ref 0.1–1.0)
NEUTROS ABS: 5.5 10*3/uL (ref 1.4–7.7)
Neutrophils Relative %: 68.5 % (ref 43.0–77.0)
Platelets: 240 10*3/uL (ref 150.0–400.0)
RBC: 5.06 Mil/uL (ref 4.22–5.81)
RDW: 13.7 % (ref 11.5–15.5)
WBC: 8 10*3/uL (ref 4.0–10.5)

## 2014-06-29 LAB — HEPATIC FUNCTION PANEL
ALBUMIN: 4.4 g/dL (ref 3.5–5.2)
ALT: 13 U/L (ref 0–53)
AST: 13 U/L (ref 0–37)
Alkaline Phosphatase: 68 U/L (ref 39–117)
Bilirubin, Direct: 0.1 mg/dL (ref 0.0–0.3)
Total Bilirubin: 0.6 mg/dL (ref 0.2–1.2)
Total Protein: 7.3 g/dL (ref 6.0–8.3)

## 2014-06-29 LAB — TSH: TSH: 2.42 u[IU]/mL (ref 0.35–4.50)

## 2014-06-29 MED ORDER — IRBESARTAN-HYDROCHLOROTHIAZIDE 150-12.5 MG PO TABS
1.0000 | ORAL_TABLET | Freq: Every day | ORAL | Status: DC
Start: 2014-06-29 — End: 2014-09-12

## 2014-06-29 MED ORDER — ROSUVASTATIN CALCIUM 40 MG PO TABS: ORAL_TABLET | ORAL | Status: DC

## 2014-06-29 MED ORDER — CITALOPRAM HYDROBROMIDE 10 MG PO TABS: 10.0000 mg | ORAL_TABLET | Freq: Every day | ORAL | Status: DC

## 2014-06-29 MED ORDER — CLOPIDOGREL BISULFATE 75 MG PO TABS: ORAL_TABLET | ORAL | Status: DC

## 2014-06-29 MED ORDER — OMEPRAZOLE 20 MG PO CPDR: DELAYED_RELEASE_CAPSULE | ORAL | Status: DC

## 2014-06-29 MED ORDER — AMLODIPINE BESYLATE 5 MG PO TABS: ORAL_TABLET | ORAL | Status: DC

## 2014-06-29 NOTE — Progress Notes (Signed)
Subjective:    Patient ID: Timothy Wood, male    DOB: 05-06-1940, 74 y.o.   MRN: [id number]  HPI  74 year old white male with history of hypertension, peripheral vascular disease, hyperlipidemia and pulmonary nodule for routine follow-up.  Interval medical history-patient reports his wife and son passed away from stage IV lung cancer within the last 6 months. Patient reports frequent crying spells 1-2 months after his wife passed away. Patient reports he is starting to feeling better. He has a supportive daughter that lives in [place]. He sees her twice a week. He does report starting alcohol use. He has never been a drinker in the past.  He was offered counseling services through hospice care for her son. He declined.  Hypertension-stable  Pulmonary nodules-patient scheduled for repeat CT scan    Review of Systems Negative for chest pain or shortness of breath, occasional cramping of his feet and hands on left side    Past Medical History  Diagnosis Date  . Hypertension   . Tobacco abuse   . Pulmonary nodule 06/2006    9 mm right upper obe pulmonary nodule (negative Pet Scan)  . Personal history of colonic polyps 2008    multiple adenomas 2008-2009 exams with large cecal adenoma  . Hyperlipidemia   . PVD (peripheral vascular disease)     left leg s/p stent  . Seborrheic keratosis   . Sebaceous cyst     neck  . Sinusitis   . Blood clotting disorder   . Diverticulosis   . Chronic obstructive pulmonary disease (COPD)   . GERD (gastroesophageal reflux disease)     History   Social History  . Marital Status: Married    Spouse Name: N/A  . Number of Children: 2  . Years of Education: N/A   Occupational History  . Retired    Social History Main Topics  . Smoking status: Current Every Day Smoker -- 0.75 packs/day for 45 years    Types: Cigarettes  . Smokeless tobacco: Never Used  . Alcohol Use: No  . Drug Use: No  . Sexual Activity: Not on file   Other  Topics Concern  . Not on file   Social History Narrative   The patient is happily married.  He lives with his wife.     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     Occasional alcohol.      Tobacco use - over 50 pack years.   Father died at age 43 of suicide.    Past Surgical History  Procedure Laterality Date  . Back surgery  1975, 1978    x2 lumbar fusion over 30 years ago  . Knee surgery  2002    left  . Tonsillectomy  1956  . Angioplasty / stenting iliac  08/2008    Dr. Trula Slade  . Descending aortic aneurysm repair w/ stent  04/2010    Dr. Trula Slade  . Colonoscopy  05/11/2006; 07/27/2006;05/05/2007, 11/24/2010    adenomas with 9 polyps index and 2.5 cm cecal polyp removed over 3 exams, last 2009 with APC. diverticulosis also.  2012: 12 adenomas removed, largest 8-10 mm, diverticulosis  . Esophagogastroduodenoscopy  05/05/2007    GERD - one erosion  . Percutaneous pinning Left 06/24/2012    Procedure: Repair Complex Laceration Left Thumb with percutaneous pinning.;  Surgeon: Dennie Bible, MD;  Location: [facility];  Service: Plastics;  Laterality: Left;    Family History  Problem Relation Age of Onset  .  Colon cancer Mother 72  . Heart disease Brother   . Diabetes    . Lung cancer Son     smoked and was a Building control surveyor  . Heart disease Brother   . Deep vein thrombosis Mother   . Rheum arthritis Daughter   . Rheum arthritis Sister     Allergies  Allergen Reactions  . Hydrocodone     Current Outpatient Prescriptions on File Prior to Visit  Medication Sig Dispense Refill  . amLODipine (NORVASC) 5 MG tablet TAKE 1 TABLET (5 MG TOTAL) BY MOUTH DAILY. 90 tablet 0  . aspirin 325 MG tablet Take 325 mg by mouth every morning.    . clindamycin (CLEOCIN) 300 MG capsule Take 1 capsule (300 mg total) by mouth 3 (three) times daily. 21 capsule 0  . clopidogrel (PLAVIX) 75 MG tablet TAKE 1 TABLET (75 MG TOTAL) BY MOUTH EVERY MORNING. 90 tablet 0  . CRESTOR 40 MG tablet TAKE 1 TABLET (40 MG  TOTAL) BY MOUTH DAILY. 30 tablet 2  . cyclobenzaprine (FLEXERIL) 5 MG tablet Take 1 tablet (5 mg total) by mouth 2 (two) times daily as needed for muscle spasms. 60 tablet 3  . irbesartan-hydrochlorothiazide (AVALIDE) 150-12.5 MG per tablet TAKE 1 TABLET BY MOUTH DAILY. 90 tablet 0  . omeprazole (PRILOSEC) 20 MG capsule TAKE 1 CAPSULE (20 MG TOTAL) BY MOUTH 2 (TWO) TIMES DAILY BEFORE A MEAL. 60 capsule 4  . pantoprazole (PROTONIX) 40 MG tablet Take 1 tablet (40 mg total) by mouth daily. 30 tablet 3  . sulfamethoxazole-trimethoprim (BACTRIM DS) 800-160 MG per tablet Take 1 tablet by mouth 2 (two) times daily. 20 tablet 0   No current facility-administered medications on file prior to visit.    BP 134/72 mmHg  Pulse 63  Temp(Src) 97.7 F (36.5 C) (Oral)  Ht 5\' 9"  (1.753 m)  Wt 165 lb (74.844 kg)  BMI 24.36 kg/m2    Objective:   Physical Exam  Constitutional: He is oriented to person, place, and time. He appears well-developed and well-nourished. No distress.  HENT:  Head: Normocephalic and atraumatic.  Neck:  No carotid bruit  Cardiovascular: Normal rate, regular rhythm and normal heart sounds.   No murmur heard. Pulmonary/Chest: Effort normal and breath sounds normal. He has no wheezes.  Neurological: He is alert and oriented to person, place, and time. No cranial nerve deficit.  Skin: Skin is warm and dry.  Psychiatric: He has a normal mood and affect. His behavior is normal.          Assessment & Plan:

## 2014-06-29 NOTE — Assessment & Plan Note (Signed)
BP stable.  Monitor electrolytes and kidney function. BP: 134/72 mmHg

## 2014-06-29 NOTE — Progress Notes (Signed)
Pre visit review using our clinic review tool, if applicable. No additional management support is needed unless otherwise documented below in the visit note. 

## 2014-06-29 NOTE — Assessment & Plan Note (Signed)
Patient experiencing depressive symptoms after his wife passed away from stage IV lung cancer.  He lost his son to same disease.  Trial of citalopram 10 mg once daily.  Reassess in 6 weeks.

## 2014-06-29 NOTE — Assessment & Plan Note (Signed)
Continue Crestor 40 mg once daily. Obtain fasting lipid panel and LFTs

## 2014-06-29 NOTE — Assessment & Plan Note (Signed)
Patient has already scheduled follow-up CT scan of his chest.

## 2014-07-04 ENCOUNTER — Ambulatory Visit (INDEPENDENT_AMBULATORY_CARE_PROVIDER_SITE_OTHER)
Admission: RE | Admit: 2014-07-04 | Discharge: 2014-07-04 | Disposition: A | Discharge status: Home / Self Care | Payer: Medicare HMO | Source: Ambulatory Visit | Attending: Internal Medicine | Admitting: Internal Medicine

## 2014-07-04 DIAGNOSIS — R918 Other nonspecific abnormal finding of lung field: Secondary | ICD-10-CM

## 2014-07-05 ENCOUNTER — Other Ambulatory Visit: Payer: Self-pay | Admitting: Internal Medicine

## 2014-07-05 ENCOUNTER — Other Ambulatory Visit: Payer: Private Health Insurance - Indemnity

## 2014-07-05 DIAGNOSIS — R918 Other nonspecific abnormal finding of lung field: Secondary | ICD-10-CM

## 2014-07-05 NOTE — Progress Notes (Signed)
Quick Note:  Spoke with pt and notified of results per Dr. Wert. Pt verbalized understanding and denied any questions.  ______ 

## 2014-07-06 ENCOUNTER — Telehealth: Payer: Self-pay | Admitting: *Deleted

## 2014-07-06 NOTE — Telephone Encounter (Signed)
-----   Message from Catha Gosselin sent at 07/05/2014  4:50 PM EDT ----- Regarding: PET Scan scheduled  PET Scan scheduled for 07/11/14 at 10:00. Pt is aware of this appointment.  Thanks, Suanne Marker

## 2014-07-06 NOTE — Telephone Encounter (Signed)
Needs ov with MW 4/27 PM to review PET scan  [facility] for the pt

## 2014-07-11 ENCOUNTER — Ambulatory Visit (HOSPITAL_COMMUNITY)
Admission: RE | Admit: 2014-07-11 | Discharge: 2014-07-11 | Disposition: A | Discharge status: Home / Self Care | Payer: Medicare HMO | Source: Ambulatory Visit | Attending: Internal Medicine | Admitting: Internal Medicine

## 2014-07-11 DIAGNOSIS — R918 Other nonspecific abnormal finding of lung field: Secondary | ICD-10-CM | POA: Insufficient documentation

## 2014-07-11 DIAGNOSIS — Z720 Tobacco use: Secondary | ICD-10-CM | POA: Insufficient documentation

## 2014-07-11 LAB — GLUCOSE, CAPILLARY: GLUCOSE-CAPILLARY: 118 mg/dL — AB (ref 70–99)

## 2014-07-11 MED ORDER — FLUDEOXYGLUCOSE F - 18 (FDG) INJECTION
8.0800 | Freq: Once | INTRAVENOUS | Status: AC | PRN
  Administered 2014-07-11: 8.08 via INTRAVENOUS

## 2014-07-11 NOTE — Telephone Encounter (Signed)
See result note on PET scan

## 2014-07-11 NOTE — Progress Notes (Signed)
Quick Note:  Spoke with pt and notified of results per Dr. Wert. Pt verbalized understanding and denied any questions.  ______ 

## 2014-08-10 ENCOUNTER — Ambulatory Visit: Payer: Medicare HMO | Admitting: Internal Medicine

## 2014-08-15 ENCOUNTER — Other Ambulatory Visit: Payer: Self-pay | Admitting: Internal Medicine

## 2014-09-12 ENCOUNTER — Other Ambulatory Visit: Payer: Self-pay | Admitting: Internal Medicine

## 2014-09-14 ENCOUNTER — Ambulatory Visit: Payer: Medicare HMO | Admitting: Internal Medicine

## 2014-09-14 ENCOUNTER — Ambulatory Visit (INDEPENDENT_AMBULATORY_CARE_PROVIDER_SITE_OTHER): Payer: Self-pay | Admitting: Family Medicine

## 2014-09-14 DIAGNOSIS — R69 Illness, unspecified: Secondary | ICD-10-CM

## 2014-09-14 NOTE — Progress Notes (Signed)
No show

## 2014-09-19 ENCOUNTER — Encounter: Payer: Self-pay | Admitting: Adult Health

## 2014-09-19 ENCOUNTER — Ambulatory Visit (INDEPENDENT_AMBULATORY_CARE_PROVIDER_SITE_OTHER): Payer: Medicare HMO | Admitting: Adult Health

## 2014-09-19 VITALS — BP 130/76 | Temp 97.9°F | Ht 69.0 in | Wt 157.9 lb

## 2014-09-19 DIAGNOSIS — F329 Major depressive disorder, single episode, unspecified: Secondary | ICD-10-CM

## 2014-09-19 DIAGNOSIS — R251 Tremor, unspecified: Secondary | ICD-10-CM | POA: Diagnosis not present

## 2014-09-19 DIAGNOSIS — F32A Depression, unspecified: Secondary | ICD-10-CM

## 2014-09-19 MED ORDER — CITALOPRAM HYDROBROMIDE 20 MG PO TABS
20.0000 mg | ORAL_TABLET | Freq: Every day | ORAL | Status: DC
Start: 2014-09-19 — End: 2015-06-21

## 2014-09-19 NOTE — Progress Notes (Signed)
Pre visit review using our clinic review tool, if applicable. No additional management support is needed unless otherwise documented below in the visit note. 

## 2014-09-19 NOTE — Progress Notes (Signed)
Subjective:    Patient ID: EL PILE, male    DOB: [birth year], 74 y.o.   MRN: [id number]  HPI  Patient presents to the office for follow up regarding his citalopram trial. He was started on 10 mg by Dr. Shawna Orleans six weeks ago, following the death of his wife and son. He does not notice any difference. He continues to feel lethargic and does not want to leave the house. He once enjoyed doing yard work but recently he has lost joy in that as well. He does walk his dog twice a day. He is going to [place] in two weeks to visit friends and family.    He also endorses that he is having " shaking episodes" they come and go, he usually notices it when he is is using a utensil or trying to write. He continues " I feel like the inside of my body is always quivering." Denies any memory problems   Review of Systems  Constitutional: Positive for activity change and fatigue. Negative for appetite change and unexpected weight change.  HENT: Negative.   Eyes: Negative.   Respiratory: Negative.   Cardiovascular: Negative.   Gastrointestinal: Negative.   Endocrine: Negative.   Musculoskeletal: Negative.   Skin: Negative.   Neurological: Positive for tremors.  Hematological: Negative.   Psychiatric/Behavioral: Positive for decreased concentration. Negative for suicidal ideas, behavioral problems, confusion, sleep disturbance, self-injury and agitation.  All other systems reviewed and are negative.  Past Medical History  Diagnosis Date  . Hypertension   . Tobacco abuse   . Pulmonary nodule 06/2006    9 mm right upper obe pulmonary nodule (negative Pet Scan)  . Personal history of colonic polyps 2008    multiple adenomas 2008-2009 exams with large cecal adenoma  . Hyperlipidemia   . PVD (peripheral vascular disease)     left leg s/p stent  . Seborrheic keratosis   . Sebaceous cyst     neck  . Sinusitis   . Blood clotting disorder   . Diverticulosis   . Chronic obstructive pulmonary  disease (COPD)   . GERD (gastroesophageal reflux disease)     History   Social History  . Marital Status: Married    Spouse Name: N/A  . Number of Children: 2  . Years of Education: N/A   Occupational History  . Retired    Social History Main Topics  . Smoking status: Current Every Day Smoker -- 0.75 packs/day for 45 years    Types: Cigarettes  . Smokeless tobacco: Never Used  . Alcohol Use: No  . Drug Use: No  . Sexual Activity: Not on file   Other Topics Concern  . Not on file   Social History Narrative   The patient is happily married.  He lives with his wife.     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     Occasional alcohol.      Tobacco use - over 50 pack years.   Father died at age 60 of suicide.    Past Surgical History  Procedure Laterality Date  . Back surgery  1975, 1978    x2 lumbar fusion over 30 years ago  . Knee surgery  2002    left  . Tonsillectomy  1956  . Angioplasty / stenting iliac  08/2008    Dr. Trula Slade  . Descending aortic aneurysm repair w/ stent  04/2010    Dr. Trula Slade  . Colonoscopy  05/11/2006; 07/27/2006;05/05/2007, 11/24/2010  adenomas with 9 polyps index and 2.5 cm cecal polyp removed over 3 exams, last 2009 with APC. diverticulosis also.  2012: 12 adenomas removed, largest 8-10 mm, diverticulosis  . Esophagogastroduodenoscopy  05/05/2007    GERD - one erosion  . Percutaneous pinning Left 06/24/2012    Procedure: Repair Complex Laceration Left Thumb with percutaneous pinning.;  Surgeon: Dennie Bible, MD;  Location: [place];  Service: Plastics;  Laterality: Left;    Family History  Problem Relation Age of Onset  . Colon cancer Mother 71  . Heart disease Brother   . Diabetes    . Lung cancer Son     smoked and was a Building control surveyor  . Heart disease Brother   . Deep vein thrombosis Mother   . Rheum arthritis Daughter   . Rheum arthritis Sister     Allergies  Allergen Reactions  . Hydrocodone     Current Outpatient Prescriptions on  File Prior to Visit  Medication Sig Dispense Refill  . amLODipine (NORVASC) 5 MG tablet TAKE 1 TABLET (5 MG TOTAL) BY MOUTH DAILY. 90 tablet 1  . aspirin 325 MG tablet Take 325 mg by mouth every morning.    . citalopram (CELEXA) 10 MG tablet Take 1 tablet (10 mg total) by mouth daily. 30 tablet 2  . clopidogrel (PLAVIX) 75 MG tablet TAKE 1 TABLET (75 MG TOTAL) BY MOUTH EVERY MORNING. 90 tablet 1  . CRESTOR 40 MG tablet TAKE 1 TABLET (40 MG TOTAL) BY MOUTH DAILY. 30 tablet 1  . irbesartan-hydrochlorothiazide (AVALIDE) 150-12.5 MG per tablet Take 1 tablet by mouth daily. 90 tablet 1  . omeprazole (PRILOSEC) 20 MG capsule TAKE 1 CAPSULE (20 MG TOTAL) BY MOUTH 2 (TWO) TIMES DAILY BEFORE A MEAL. 180 capsule 1  . cyclobenzaprine (FLEXERIL) 5 MG tablet Take 1 tablet (5 mg total) by mouth 2 (two) times daily as needed for muscle spasms. (Patient not taking: Reported on 09/19/2014) 60 tablet 3   No current facility-administered medications on file prior to visit.    BP 130/76 mmHg  Temp(Src) 97.9 F (36.6 C) (Oral)  Ht '5\' 9"'$  (1.753 m)  Wt 157 lb 14.4 oz (71.623 kg)  BMI 23.31 kg/m2       Objective:   Physical Exam  Constitutional: He is oriented to person, place, and time. He appears well-developed and well-nourished. No distress.  Eyes: Conjunctivae and EOM are normal. Pupils are equal, round, and reactive to light. Right eye exhibits no discharge. Left eye exhibits no discharge.  Cardiovascular: Normal rate, regular rhythm, normal heart sounds and intact distal pulses.  Exam reveals no gallop and no friction rub.   No murmur heard. Pulmonary/Chest: Effort normal and breath sounds normal. No respiratory distress. He has no wheezes. He has no rales. He exhibits no tenderness.  Musculoskeletal: Normal range of motion. He exhibits no edema or tenderness.  Neurological: He is alert and oriented to person, place, and time. He has normal reflexes. He displays normal reflexes. He exhibits normal  muscle tone. Abnormal coordination: slight cog wheel regidity in right arm   Tremor with rest and movement, is more noticeable when he holds a cup or pen and when he hold arms out in front of him. R>L  slight cog wheel regidity in right arm. ? bradykinesia   Skin: Skin is warm and dry. He is not diaphoretic.  Psychiatric: He has a normal mood and affect. His behavior is normal. Judgment and thought content normal.  Nursing note and vitals  reviewed.     Assessment & Plan:  1. Tremor - ? Parkinsons - Vitamin X67 - Basic metabolic panel - Folate - Ambulatory referral to Neurology  2. Depression - citalopram (CELEXA) 20 MG tablet; Take 1 tablet (20 mg total) by mouth daily.  Dispense: 30 tablet; Refill: 3 - Follow up when return from [place]

## 2014-09-19 NOTE — Patient Instructions (Signed)
It was great meeting you today! I will follow up with you regarding your blood work. Please start taking 20 mg of Celexa. Follow up with me or Dr. Shawna Orleans once you get back from the [facility].

## 2014-09-25 ENCOUNTER — Encounter: Payer: Self-pay | Admitting: Neurology

## 2014-09-25 ENCOUNTER — Ambulatory Visit (INDEPENDENT_AMBULATORY_CARE_PROVIDER_SITE_OTHER): Payer: Medicare HMO | Admitting: Neurology

## 2014-09-25 VITALS — BP 124/68 | HR 76 | Ht 69.0 in | Wt 156.5 lb

## 2014-09-25 DIAGNOSIS — F4321 Adjustment disorder with depressed mood: Secondary | ICD-10-CM

## 2014-09-25 DIAGNOSIS — G250 Essential tremor: Secondary | ICD-10-CM | POA: Diagnosis not present

## 2014-09-25 NOTE — Progress Notes (Signed)
Timothy Wood was seen today in the movement disorders clinic for neurologic consultation at the request of Dorothyann Peng.  The consultation is for the evaluation of tremor and to r/o PD.  He is accompanied by his daughter who supplements the history.  He states that he is leaving Friday for extended vacation and was hoping to try and get some answers before he goes.    Specific Symptoms:  Tremor: Yes.  , started it in the head when he was in his 50's.  Started in the hands in 04-12-22 after his wife died.  Both hands shake equally.  Right hand dominant but both shake equally.  Bothers him when attempting to eat or with fine motor coordinations.  Better once he increased the celexa (only increased one week ago).  States that it helped at least 60% when it was increased.  There is a family history of tremor in his mother.  His brother also had tremor but they attributed that alcohol.  Affected by caffeine:  No.  Affected by alcohol:  No.  Affected by stress:  Yes.    Affected by fatigue:  No.  Spills soup if on spoon:  Yes.    Spills glass of liquid if full: intermittently  Affects ADL's (tying shoes, brushing teeth, etc):  No.    Voice: no change Sleep: sleeps during the day if tired so no regular sleep schedule  Vivid Dreams:  Yes.    Acting out dreams:  No. (not in a long time) Wet Pillows: No. Postural symptoms:  Yes.  , just a little off balance for years  Falls?  No. Bradykinesia symptoms: difficulty getting out of a chair ("old age") Loss of smell:  No. Loss of taste:  No. Urinary Incontinence:  No. Difficulty Swallowing:  Yes.   (sometimes will choke on own saliva, otherwise does well) Handwriting, micrographia: No. (is illegible because of tremor) Memory changes:  No. N/V:  No. Lightheaded:  Yes.  , occasionally when first stands up or when looks straight up  Syncope: No. Diplopia:  No.   Neuroimaging has not previously been performed.     ALLERGIES:   Allergies    Allergen Reactions  . Hydrocodone     CURRENT MEDICATIONS:  Outpatient Encounter Prescriptions as of 09/25/2014  Medication Sig  . amLODipine (NORVASC) 5 MG tablet TAKE 1 TABLET (5 MG TOTAL) BY MOUTH DAILY.  [name] Kitchen aspirin 325 MG tablet Take 325 mg by mouth every morning.  . citalopram (CELEXA) 20 MG tablet Take 1 tablet (20 mg total) by mouth daily.  . clopidogrel (PLAVIX) 75 MG tablet TAKE 1 TABLET (75 MG TOTAL) BY MOUTH EVERY MORNING.  . CRESTOR 40 MG tablet TAKE 1 TABLET (40 MG TOTAL) BY MOUTH DAILY.  . cyclobenzaprine (FLEXERIL) 5 MG tablet Take 1 tablet (5 mg total) by mouth 2 (two) times daily as needed for muscle spasms.  . irbesartan-hydrochlorothiazide (AVALIDE) 150-12.5 MG per tablet Take 1 tablet by mouth daily.  [name] Kitchen omeprazole (PRILOSEC) 20 MG capsule TAKE 1 CAPSULE (20 MG TOTAL) BY MOUTH 2 (TWO) TIMES DAILY BEFORE A MEAL.  . [DISCONTINUED] citalopram (CELEXA) 10 MG tablet Take 1 tablet (10 mg total) by mouth daily.   No facility-administered encounter medications on file as of 09/25/2014.    PAST MEDICAL HISTORY:   Past Medical History  Diagnosis Date  . Hypertension   . Tobacco abuse   . Pulmonary nodule 06/2006    9 mm right upper obe pulmonary nodule (  negative Pet Scan)  . Personal history of colonic polyps 2008    multiple adenomas 2008-2009 exams with large cecal adenoma  . Hyperlipidemia   . PVD (peripheral vascular disease)     left leg s/p stent  . Seborrheic keratosis   . Sebaceous cyst     neck  . Sinusitis   . Blood clotting disorder   . Diverticulosis   . Chronic obstructive pulmonary disease (COPD)   . GERD (gastroesophageal reflux disease)     PAST SURGICAL HISTORY:   Past Surgical History  Procedure Laterality Date  . Back surgery  1975, 1978    x2 lumbar fusion over 30 years ago  . Knee surgery  2002    left  . Tonsillectomy  1956  . Angioplasty / stenting iliac  08/2008    Dr. Trula Slade  . Descending aortic aneurysm repair w/ stent  04/2010     Dr. Trula Slade  . Colonoscopy  05/11/2006; 07/27/2006;05/05/2007, 11/24/2010    adenomas with 9 polyps index and 2.5 cm cecal polyp removed over 3 exams, last 2009 with APC. diverticulosis also.  2012: 12 adenomas removed, largest 8-10 mm, diverticulosis  . Esophagogastroduodenoscopy  05/05/2007    GERD - one erosion  . Percutaneous pinning Left 06/24/2012    Procedure: Repair Complex Laceration Left Thumb with percutaneous pinning.;  Surgeon: Dennie Bible, MD;  Location: [facility];  Service: Plastics;  Laterality: Left;    SOCIAL HISTORY:   History   Social History  . Marital Status: Married    Spouse Name: N/A  . Number of Children: 2  . Years of Education: N/A   Occupational History  . Retired    Social History Main Topics  . Smoking status: Current Every Day Smoker -- 0.75 packs/day for 45 years    Types: Cigarettes  . Smokeless tobacco: Never Used  . Alcohol Use: No  . Drug Use: No  . Sexual Activity: Not Currently   Other Topics Concern  . Not on file   Social History Narrative    Married for over 50 years.  Wife died from lung cancer.     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     Occasional alcohol.      Tobacco use - over 50 pack years.   Father died at age 78 of suicide.   [facility]-daughter emergency contact    FAMILY HISTORY:   Family Status  Relation Status Death Age  . Mother Deceased 76    essential tremor  . Son Deceased     lung CA  . Father Deceased 24    ROS:  A complete 10 system review of systems was obtained and was unremarkable apart from what is mentioned above.  PHYSICAL EXAMINATION:    VITALS:   Filed Vitals:   09/25/14 1347  BP: 124/68  Pulse: 76  Height: '5\' 9"'$  (1.753 m)  Weight: 156 lb 8 oz (70.988 kg)  SpO2: 93%    GEN:  The patient appears stated age and is in NAD. HEENT:  Normocephalic, atraumatic.  The mucous membranes are moist. The superficial temporal arteries are without ropiness or  tenderness. CV:  RRR Lungs:  CTAB Neck/HEME:  There are no carotid bruits bilaterally.  Neurological examination:  Orientation: The patient is alert and oriented x3. Fund of knowledge is appropriate.  Recent and remote memory are intact.  Attention and concentration are normal.    Able to name objects and repeat phrases.  Cranial nerves: There is good facial symmetry. Pupils are equal round and reactive to light bilaterally. Fundoscopic exam reveals clear margins bilaterally. Extraocular muscles are intact. The visual fields are full to confrontational testing. The speech is fluent and clear. Soft palate rises symmetrically and there is no tongue deviation. Hearing is intact to conversational tone. Sensation: Sensation is intact to light and pinprick throughout (facial, trunk, extremities). Vibration is intact at the bilateral big toe. There is no extinction with double simultaneous stimulation. There is no sensory dermatomal level identified. Motor: Strength is 5/5 in the bilateral upper and lower extremities.   Shoulder shrug is equal and symmetric.  There is no pronator drift. Deep tendon reflexes: Deep tendon reflexes are 2-/4 at the bilateral biceps, triceps, brachioradialis, patella and achilles. Plantar responses are downgoing bilaterally.  Movement examination: Tone: There is normal tone in the bilateral upper extremities.  The tone in the lower extremities is normal.  Abnormal movements: There is head tremor in the "yes" direction.  There is no rest tremor that can be seen, but rest tremor can be felt mildly in the arms and legs.  Tremor does not increase (in fact decreases) with distraction procedures.  There is a very rare right leg tremor.  There is tremor of the outstretched hands, right greater than left, that increases with intention.  He does have difficulty with Archimedes spirals, right greater than left.  He does spill some water when pouring it from one glass to  another. Coordination:  There is no decremation with RAM's, with any form of rapid alternating movements, including hand opening and closing, alternation of supination/pronation of the forearm, finger taps, toe taps, heel taps.  He does have some difficulty with finger-nose-finger with the eyes closed but has no difficulty with the eyes open, with the exception of intention tremor. Gait and Station: The patient has no difficulty arising out of a deep-seated chair without the use of the hands. The patient's stride length is normal.    Lab Results  Component Value Date   TSH 2.42 06/29/2014     Chemistry      Component Value Date/Time   NA 136 06/29/2014 1343   K 3.8 06/29/2014 1343   CL 102 06/29/2014 1343   CO2 27 06/29/2014 1343   BUN 15 06/29/2014 1343   CREATININE 0.82 06/29/2014 1343      Component Value Date/Time   CALCIUM 9.6 06/29/2014 1343   ALKPHOS 68 06/29/2014 1343   AST 13 06/29/2014 1343   ALT 13 06/29/2014 1343   BILITOT 0.6 06/29/2014 1343       ASSESSMENT/PLAN:  1.  Essential tremor  -This is evidenced by the long-standing nature of head tremor and a family history of tremor.  I think his tremor recently increased because of stress/situational depression (death of his son and wife).  Stress often will increase essential tremor.  He does state that the Celexa has helped this and really wants no further medication right now for his essential tremor.  I reassured him that I saw no evidence of Parkinson's disease.  -I gave him literature on essential tremor and the international essential tremor Foundation.  Patient education was provided.  Much greater than 50% of this 45 minute visit was spent in counseling with the patient and his daughter.  -We did talk about types of medications that are good for essential tremor.  We also talked about DBS therapy, although I do not think he needs that at this point.  -  The patient is going out of town for the next several months and  perhaps even longer.  I told him I'll be happy to see him back any time and he just needs to make a follow-up appointment if needed.

## 2014-12-20 ENCOUNTER — Other Ambulatory Visit: Payer: Self-pay | Admitting: Internal Medicine

## 2014-12-20 DIAGNOSIS — R918 Other nonspecific abnormal finding of lung field: Secondary | ICD-10-CM

## 2014-12-28 ENCOUNTER — Ambulatory Visit (INDEPENDENT_AMBULATORY_CARE_PROVIDER_SITE_OTHER)
Admission: RE | Admit: 2014-12-28 | Discharge: 2014-12-28 | Disposition: A | Discharge status: Home / Self Care | Payer: Medicare HMO | Source: Ambulatory Visit | Attending: Internal Medicine | Admitting: Internal Medicine

## 2014-12-28 DIAGNOSIS — R918 Other nonspecific abnormal finding of lung field: Secondary | ICD-10-CM | POA: Diagnosis not present

## 2015-01-01 ENCOUNTER — Telehealth: Payer: Self-pay | Admitting: Internal Medicine

## 2015-01-01 DIAGNOSIS — R911 Solitary pulmonary nodule: Secondary | ICD-10-CM

## 2015-01-01 NOTE — Telephone Encounter (Signed)
Called and spoke to pt. Pt stated he is wanting to move forward with surgery that was discussed on 10.17.2016.  Dr. Melvyn Novas please advise. Thanks.

## 2015-01-01 NOTE — Telephone Encounter (Signed)
Refer to T surgery Dr Roxan Hockey re SPN slowly growing

## 2015-01-01 NOTE — Telephone Encounter (Signed)
I have placed referral. Pt is aware.  Nothing further needed

## 2015-01-14 ENCOUNTER — Encounter: Payer: Self-pay | Admitting: Thoracic Surgery (Cardiothoracic Vascular Surgery)

## 2015-01-14 ENCOUNTER — Other Ambulatory Visit: Payer: Self-pay | Admitting: *Deleted

## 2015-01-14 ENCOUNTER — Institutional Professional Consult (permissible substitution) (INDEPENDENT_AMBULATORY_CARE_PROVIDER_SITE_OTHER): Payer: Medicare HMO | Admitting: Thoracic Surgery (Cardiothoracic Vascular Surgery)

## 2015-01-14 VITALS — BP 139/76 | HR 80 | Resp 20 | Ht 69.0 in | Wt 156.0 lb

## 2015-01-14 DIAGNOSIS — I2584 Coronary atherosclerosis due to calcified coronary lesion: Secondary | ICD-10-CM | POA: Diagnosis not present

## 2015-01-14 DIAGNOSIS — R911 Solitary pulmonary nodule: Secondary | ICD-10-CM

## 2015-01-14 DIAGNOSIS — I2510 Atherosclerotic heart disease of native coronary artery without angina pectoris: Secondary | ICD-10-CM | POA: Insufficient documentation

## 2015-01-14 DIAGNOSIS — R918 Other nonspecific abnormal finding of lung field: Secondary | ICD-10-CM

## 2015-01-14 NOTE — Progress Notes (Signed)
PCP is [name], DO Referring Provider is Tanda Rockers, MD  Chief Complaint  Patient presents with  . Lung Lesion    Surgical eval, Chest CT 12/28/14, last PET Scan 07/11/14    HPI: [name] is a 74 year old smoker sent for consultation regarding a groundglass opacity in the right lung. He has a past medical history significant for hypertension, hyperlipidemia, tobacco abuse (60+ pack years), peripheral vascular disease, DVT after knee surgery, COPD, GERD, colonic polyps, and multiple pulmonary nodules followed by CT.  He recently had a 6 month followup CT. That showed multiple stable granulomas. There was a groundglass opacity in the right upper lobe which was unchanged. However, there is a groundglass opacity in the right middle lobe is very slowly grown over time. It was first noted back in 2010 is about 4 mm that time. He now has continued to grow and is 1.7 x 1.0 cm. There is no mediastinal or hilar adenopathy.  He has been feeling poorly recently. He has lost 30 pounds over the past 6 months and 15 pounds over the past 3 months. He chooses to poor appetite and depression since his wife's death from lung cancer in 03/28/22. He was having dizzy spells in July and August and actually passed out one time. He denies any recent dizzy spells but is daughter so that he has had some. Complains of fatigue and general malaise. He continues to smoke a pack of cigarettes daily. He has no interest in quitting. He denies any chest pain, pressure, or tightness. He's not had any headaches or visual changes.  Zubrod Score: At the time of surgery this patient's most appropriate activity status/level should be described as: '[x]'$     0    Normal activity, no symptoms '[]'$     1    Restricted in physical strenuous activity but ambulatory, able to do out light work '[]'$     2    Ambulatory and capable of self care, unable to do work activities, up and about >50 % of waking hours                              '[]'$     3     Only limited self care, in bed greater than 50% of waking hours '[]'$     4    Completely disabled, no self care, confined to bed or chair '[]'$     5    Moribund   Past Medical History  Diagnosis Date  . Hypertension   . Tobacco abuse   . Pulmonary nodule 06/2006    9 mm right upper obe pulmonary nodule (negative Pet Scan)  . Personal history of colonic polyps 2008    multiple adenomas 2008-2009 exams with large cecal adenoma  . Hyperlipidemia   . PVD (peripheral vascular disease) ([place])     left leg s/p stent  . Seborrheic keratosis   . Sebaceous cyst     neck  . Sinusitis   . Blood clotting disorder ([place])   . Diverticulosis   . Chronic obstructive pulmonary disease (COPD) ([place])   . GERD (gastroesophageal reflux disease)     Past Surgical History  Procedure Laterality Date  . Back surgery  1975, 1978    x2 lumbar fusion over 30 years ago  . Knee surgery  2002    left  . Tonsillectomy  1956  . Angioplasty / stenting iliac  08/2008    Dr.  Brabham  . Descending aortic aneurysm repair w/ stent  04/2010    Dr. Trula Slade  . Colonoscopy  05/11/2006; 07/27/2006;05/05/2007, 11/24/2010    adenomas with 9 polyps index and 2.5 cm cecal polyp removed over 3 exams, last 2009 with APC. diverticulosis also.  2012: 12 adenomas removed, largest 8-10 mm, diverticulosis  . Esophagogastroduodenoscopy  05/05/2007    GERD - one erosion  . Percutaneous pinning Left 06/24/2012    Procedure: Repair Complex Laceration Left Thumb with percutaneous pinning.;  Surgeon: Dennie Bible, MD;  Location: [facility];  Service: Plastics;  Laterality: Left;    Family History  Problem Relation Age of Onset  . Colon cancer Mother 14  . Deep vein thrombosis Mother   . Heart disease Brother   . Diabetes    . Lung cancer Son     smoked and was a Building control surveyor  . Heart disease Brother   . Rheum arthritis Daughter   . Rheum arthritis Sister     Social History Social History  Substance Use Topics  . Smoking status: Current  Every Day Smoker -- 0.75 packs/day for 45 years    Types: Cigarettes  . Smokeless tobacco: Never Used  . Alcohol Use: No    Current Outpatient Prescriptions  Medication Sig Dispense Refill  . amLODipine (NORVASC) 5 MG tablet TAKE 1 TABLET (5 MG TOTAL) BY MOUTH DAILY. 90 tablet 1  . aspirin 325 MG tablet Take 325 mg by mouth every morning.    . citalopram (CELEXA) 20 MG tablet Take 1 tablet (20 mg total) by mouth daily. 30 tablet 3  . clopidogrel (PLAVIX) 75 MG tablet TAKE 1 TABLET (75 MG TOTAL) BY MOUTH EVERY MORNING. 90 tablet 1  . CRESTOR 40 MG tablet TAKE 1 TABLET (40 MG TOTAL) BY MOUTH DAILY. 30 tablet 1  . cyclobenzaprine (FLEXERIL) 5 MG tablet Take 1 tablet (5 mg total) by mouth 2 (two) times daily as needed for muscle spasms. 60 tablet 3  . irbesartan-hydrochlorothiazide (AVALIDE) 150-12.5 MG per tablet Take 1 tablet by mouth daily. 90 tablet 1  . omeprazole (PRILOSEC) 20 MG capsule TAKE 1 CAPSULE (20 MG TOTAL) BY MOUTH 2 (TWO) TIMES DAILY BEFORE A MEAL. 180 capsule 1   No current facility-administered medications for this visit.    Allergies  Allergen Reactions  . Hydrocodone     Review of Systems  Constitutional: Positive for activity change, appetite change, fatigue and unexpected weight change (30 pound weight loss in 6 months, 15 pounds in 3 months). Negative for fever.  HENT: Positive for dental problem (Dentures). Negative for trouble swallowing.   Eyes: Negative for photophobia and visual disturbance.  Respiratory: Positive for cough (occasional mild) and shortness of breath (With heavy exertion). Negative for wheezing.   Cardiovascular: Negative for chest pain, palpitations and leg swelling.  Gastrointestinal: Negative for abdominal pain and blood in stool.       History of polyps  Endocrine: Negative for cold intolerance and heat intolerance.  Genitourinary: Negative for dysuria and difficulty urinating.  Musculoskeletal: Positive for myalgias, back pain and  arthralgias.  Neurological: Positive for dizziness and syncope (1 episode).  Hematological: Negative for adenopathy. Bruises/bleeds easily.       History DVT after knee surgery  Psychiatric/Behavioral: Positive for dysphoric mood. The patient is nervous/anxious.   All other systems reviewed and are negative.   BP 139/76 mmHg  Pulse 80  Resp 20  Ht '5\' 9"'$  (1.753 m)  Wt 156 lb (70.761 kg)  BMI  23.03 kg/m2  SpO2 97% Physical Exam  Constitutional: He is oriented to person, place, and time. He appears well-developed and well-nourished. No distress.  HENT:  Head: Normocephalic and atraumatic.  Mouth/Throat: No oropharyngeal exudate.  Eyes: Conjunctivae and EOM are normal. No scleral icterus.  Neck: Normal carotid pulses present. Carotid bruit is not present. No tracheal deviation present. No thyroid mass and no thyromegaly present.  Cardiovascular: Normal rate, regular rhythm, S1 normal and S2 normal.  PMI is not displaced.  Exam reveals no S3 and no S4.   No murmur heard.  No systolic murmur is present  Pulses:      Carotid pulses are 2+ on the right side, and 2+ on the left side.      Dorsalis pedis pulses are 2+ on the right side, and 0 on the left side.       Posterior tibial pulses are 2+ on the right side, and 0 on the left side.  Pulmonary/Chest: Effort normal and breath sounds normal. No respiratory distress. He has no wheezes. He has no rales.  Abdominal: Soft. Bowel sounds are normal. He exhibits no distension. There is no tenderness.  Musculoskeletal: Normal range of motion. He exhibits no edema.  Lymphadenopathy:    He has no cervical adenopathy.  Neurological: He is alert and oriented to person, place, and time. No cranial nerve deficit. He exhibits normal muscle tone. Coordination normal.  Skin: Skin is warm and dry. No rash noted.  Psychiatric: He has a normal mood and affect.  Vitals reviewed.    Diagnostic Tests: CT CHEST WITHOUT  CONTRAST  TECHNIQUE: Multidetector CT imaging of the chest was performed following the standard protocol without IV contrast.  COMPARISON: Multiple exams, including 07/11/2014 an 07/03/2013  FINDINGS: Mediastinum/Nodes: Coronary, aortic arch, and branch vessel atherosclerotic vascular disease. No pathologic thoracic adenopathy.  Lungs/Pleura: Ground-glass nodule in the right middle lobe on image 42 series 3 is again noted. The measurements of this lesion over time are as follows:  12/28/2014: 1.7 by 1.0 cm  07/11/2014: 1.7 by 0.9 cm  02/15/2013: 1.4 by 0.8 cm  06/06/2008: 0.4 by 0.4 cm  Calcified granulomas observed in the right upper lobe, with a 5 mm noncalcified nodule in the right upper lobe on image 27 series 3 not appreciably changed from 2014.  Upper abdomen: Chronic and stable hypodense hepatic lesions, likely cysts. We partially image the right kidney upper pole cyst.  Musculoskeletal: Unremarkable  IMPRESSION: 1. There has been slow but convincing growth of the ground-glass nodule in the right middle lobe over the past 6 years. For example, comparing image 31 of series 603 of today's exam to image 24 series 7 of the exam from 06/06/2008 shows clear progression. This probably represents a low grade adenocarcinoma. 2. Coronary, aortic arch, and branch vessel atherosclerotic vascular disease. 3. Calcified granulomas in the right upper lobe. Noncalcified 5 mm right upper lobe lesion is stable from 2014 and probably benign. The smaller lesion has enlarged from 06/06/2008, but I suspect that this enlargement is due to being an active granuloma at that time.   Electronically Signed  By: Van Clines M.D.  On: 12/28/2014 14:10  I personally reviewed the CT chest and concur with findings as noted above  Impression:  74 year old male with a history of heavy tobacco abuse dating from age 51. He currently is smoking one pack daily. He has  had multiple lung nodules followed by CT. Several these nodules are calcified granulomas and have been stable over  time. There are 2 groundglass opacities, one in the right upper lobe and one in the right middle lobe. Right upper lobe has been relatively stable but the one in the right middle lobe has slowly grown over time. This is highly concerning for a low-grade adenocarcinoma.   I discussed our options with Mr. Currey and his daughter. They include bronchoscopic or CT-guided biopsy. I would not recommend either one of those options, because if negative they could not be relied on to rule out cancer. Given that this nodule has grown over time, I think the best option is to proceed with surgical resection. A wedge resection would be both definitively diagnostic as well as therapeutic for the right middle lobe lesion and, if it is possible to find it, the right upper lobe lesion as well.  I described the proposed operation, right VATS, wedge resection, to Mr. Engen and his daughter. They understand the general nature of the procedure, the incisions to be used, expected hospital stay, and overall recovery. I reviewed the indications, risks, benefits, and alternatives. They understand the risks include, but are not limited to death, MI, DVT, PE, bleeding, possible need for transfusion, infection, air leaks, cardiac arrhythmias, as well as the possibility of other unforeseeable complications.  He understands and accepts the risks and agrees to proceed.  He is on Plavix for peripheral arterial stents. He needs to be off of that for 7 days prior surgery.  I did inform them that he does have coronary atherosclerosis noted on CT and PET CT. He is not having any anginal symptoms so no further workup is indicated prior to surgery.  Smoking cessation instruction/counseling given:  counseled patient on the dangers of tobacco use, advised patient to stop smoking, and reviewed strategies to maximize success. He  expresses contempt at the mention of discontinuing tobacco.  Plan: Right VATS, wedge resection right middle lobe nodule, possible wedge resection of right upper lobe nodule on Thursday, 01/24/2015. He will discontinue Plavix after his dose on 01/17/2015.  [name], MD [facility] [phone]

## 2015-01-22 ENCOUNTER — Encounter (HOSPITAL_COMMUNITY): Payer: Self-pay

## 2015-01-22 ENCOUNTER — Encounter (HOSPITAL_COMMUNITY)
Admission: RE | Admit: 2015-01-22 | Discharge: 2015-01-22 | Disposition: A | Discharge status: Home / Self Care | Payer: Medicare HMO | Source: Ambulatory Visit | Attending: Thoracic Surgery (Cardiothoracic Vascular Surgery) | Admitting: Thoracic Surgery (Cardiothoracic Vascular Surgery)

## 2015-01-22 ENCOUNTER — Other Ambulatory Visit: Payer: Self-pay

## 2015-01-22 ENCOUNTER — Ambulatory Visit (HOSPITAL_COMMUNITY)
Admission: RE | Admit: 2015-01-22 | Discharge: 2015-01-22 | Disposition: A | Discharge status: Home / Self Care | Payer: Medicare HMO | Source: Ambulatory Visit | Attending: Thoracic Surgery (Cardiothoracic Vascular Surgery) | Admitting: Thoracic Surgery (Cardiothoracic Vascular Surgery)

## 2015-01-22 VITALS — BP 142/60 | HR 79 | Temp 98.1°F | Resp 20 | Ht 69.0 in | Wt 153.1 lb

## 2015-01-22 DIAGNOSIS — R911 Solitary pulmonary nodule: Secondary | ICD-10-CM

## 2015-01-22 HISTORY — DX: Acute embolism and thrombosis of unspecified deep veins of unspecified lower extremity: I82.409

## 2015-01-22 HISTORY — DX: Pneumonia, unspecified organism: J18.9

## 2015-01-22 HISTORY — DX: Depression, unspecified: F32.A

## 2015-01-22 HISTORY — DX: Unspecified osteoarthritis, unspecified site: M19.90

## 2015-01-22 HISTORY — DX: Tremor, unspecified: R25.1

## 2015-01-22 HISTORY — DX: Major depressive disorder, single episode, unspecified: F32.9

## 2015-01-22 LAB — PROTIME-INR
INR: 1.04 (ref 0.00–1.49)
Prothrombin Time: 13.8 seconds (ref 11.6–15.2)

## 2015-01-22 LAB — TYPE AND SCREEN
ABO/RH(D): B POS
ANTIBODY SCREEN: NEGATIVE

## 2015-01-22 LAB — COMPREHENSIVE METABOLIC PANEL
ALBUMIN: 4.2 g/dL (ref 3.5–5.0)
ALT: 15 U/L — AB (ref 17–63)
AST: 26 U/L (ref 15–41)
Alkaline Phosphatase: 68 U/L (ref 38–126)
Anion gap: 11 (ref 5–15)
BILIRUBIN TOTAL: 1.2 mg/dL (ref 0.3–1.2)
BUN: 15 mg/dL (ref 6–20)
CO2: 21 mmol/L — ABNORMAL LOW (ref 22–32)
CREATININE: 0.89 mg/dL (ref 0.61–1.24)
Calcium: 9.1 mg/dL (ref 8.9–10.3)
Chloride: 101 mmol/L (ref 101–111)
GFR calc Af Amer: >60 mL/min (ref >60)
GLUCOSE: 116 mg/dL — AB (ref 65–99)
POTASSIUM: 3.9 mmol/L (ref 3.5–5.1)
Sodium: 133 mmol/L — ABNORMAL LOW (ref 135–145)
TOTAL PROTEIN: 6.5 g/dL (ref 6.5–8.1)

## 2015-01-22 LAB — URINALYSIS, ROUTINE W REFLEX MICROSCOPIC
BILIRUBIN URINE: NEGATIVE
Glucose, UA: NEGATIVE mg/dL
HGB URINE DIPSTICK: NEGATIVE
Ketones, ur: NEGATIVE mg/dL
NITRITE: NEGATIVE
PROTEIN: NEGATIVE mg/dL
Specific Gravity, Urine: 1.019 (ref 1.005–1.030)
UROBILINOGEN UA: 1 mg/dL (ref 0.0–1.0)
pH: 7 (ref 5.0–8.0)

## 2015-01-22 LAB — CBC
HEMATOCRIT: 45.2 % (ref 39.0–52.0)
HEMOGLOBIN: 15.2 g/dL (ref 13.0–17.0)
MCH: 32.2 pg (ref 26.0–34.0)
MCHC: 33.6 g/dL (ref 30.0–36.0)
MCV: 95.8 fL (ref 78.0–100.0)
Platelets: 181 10*3/uL (ref 150–400)
RBC: 4.72 MIL/uL (ref 4.22–5.81)
RDW: 13.1 % (ref 11.5–15.5)
WBC: 6.8 10*3/uL (ref 4.0–10.5)

## 2015-01-22 LAB — BLOOD GAS, ARTERIAL
Acid-base deficit: 1.3 mmol/L (ref 0.0–2.0)
BICARBONATE: 22.1 meq/L (ref 20.0–24.0)
DRAWN BY: 206361
FIO2: 0.21
O2 SAT: 97.9 %
PH ART: 7.447 (ref 7.350–7.450)
Patient temperature: 98.6
TCO2: 23.1 mmol/L (ref 0–100)
pCO2 arterial: 32.6 mmHg — ABNORMAL LOW (ref 35.0–45.0)
pO2, Arterial: 93.4 mmHg (ref 80.0–100.0)

## 2015-01-22 LAB — SURGICAL PCR SCREEN
MRSA, PCR: NEGATIVE
STAPHYLOCOCCUS AUREUS: NEGATIVE

## 2015-01-22 LAB — URINE MICROSCOPIC-ADD ON

## 2015-01-22 LAB — APTT: APTT: 30 s (ref 24–37)

## 2015-01-22 NOTE — Pre-Procedure Instructions (Addendum)
    Timothy Wood  01/22/2015     Your procedure is scheduled on : Thursday November 10..  Report to [facility] Admitting at 6:00 A.M.                 Your surgery is at 8:00 A.M.    Call this number if you have problems the morning of surgery:[phone]                For any other questions, please call [phone], Monday - Friday 8 AM - 4 PM.   Remember:  Do not eat food or drink liquids after midnight Wednesday, November 9.  Take these medicines the morning of surgery with A SIP OF WATER: amLODipine (NORVASC), citalopram (CELEXA).              Stop Aspirin and Plavix as instructed by Dr Roxan Hockey.   Do not wear jewelry, make-up or nail polish.   Do not wear lotions, powders, or perfumes.                Men may shave face and neck.   Do not bring valuables to the hospital.   [facility] is not responsible for any belongings or valuables.  Contacts, dentures or bridgework may not be worn into surgery.  Leave your suitcase in the car.  After surgery it may be brought to your room.  For patients admitted to the hospital, discharge time will be determined by your treatment team.  Special instructions: Review  [facility] - Preparing For Surgery.  Please read over the following fact sheets that you were given. Pain Booklet, Coughing and Deep Breathing, Blood Transfusion Information and Surgical Site Infection Prevention

## 2015-01-23 MED ORDER — DEXTROSE 5 % IV SOLN
1.5000 g | INTRAVENOUS | Status: AC
  Administered 2015-01-24: 1.5 g via INTRAVENOUS
  Filled 2015-01-23: qty 1.5

## 2015-01-23 NOTE — Anesthesia Preprocedure Evaluation (Addendum)
Anesthesia Evaluation  Patient identified by MRN, date of birth, ID band Patient awake    Reviewed: Allergy & Precautions, NPO status , Patient's Chart, lab work & pertinent test results  History of Anesthesia Complications Negative for: history of anesthetic complications  Airway Mallampati: I  TM Distance: >3 FB Neck ROM: Full    Dental  (+) Edentulous Upper, Edentulous Lower   Pulmonary COPD, Current Smoker,  Pulmonary nodule   breath sounds clear to auscultation       Cardiovascular hypertension, Pt. on medications (-) angina+ CAD (coronary atherosclerosis noted on CT), + Peripheral Vascular Disease (AAA stent graft) and + DVT   Rhythm:Regular Rate:Normal     Neuro/Psych negative neurological ROS     GI/Hepatic Neg liver ROS, GERD  Medicated and Controlled,  Endo/Other  negative endocrine ROS  Renal/GU negative Renal ROS     Musculoskeletal  (+) Arthritis , Osteoarthritis,    Abdominal   Peds  Hematology negative hematology ROS (+)   Anesthesia Other Findings   Reproductive/Obstetrics                         Anesthesia Physical Anesthesia Plan  ASA: III  Anesthesia Plan: General   Post-op Pain Management:    Induction: Intravenous  Airway Management Planned: Oral ETT and Double Lumen EBT  Additional Equipment: Arterial line  Intra-op Plan:   Post-operative Plan: Extubation in OR  Informed Consent: I have reviewed the patients History and Physical, chart, labs and discussed the procedure including the risks, benefits and alternatives for the proposed anesthesia with the patient or authorized representative who has indicated his/her understanding and acceptance.     Plan Discussed with: Surgeon, CRNA and Anesthesiologist  Anesthesia Plan Comments: (Plan routine monitors, A line, GETA )       Anesthesia Quick Evaluation

## 2015-01-24 ENCOUNTER — Encounter (HOSPITAL_COMMUNITY)
Admission: RE | Disposition: A | Discharge status: Home / Self Care | Payer: Self-pay | Source: Ambulatory Visit | Attending: Thoracic Surgery (Cardiothoracic Vascular Surgery)

## 2015-01-24 ENCOUNTER — Inpatient Hospital Stay (HOSPITAL_COMMUNITY)
Admission: RE | Admit: 2015-01-24 | Discharge: 2015-01-27 | DRG: 164 | Disposition: A | Discharge status: Home / Self Care | Payer: Medicare HMO | Source: Ambulatory Visit | Attending: Thoracic Surgery (Cardiothoracic Vascular Surgery) | Admitting: Thoracic Surgery (Cardiothoracic Vascular Surgery)

## 2015-01-24 ENCOUNTER — Encounter (HOSPITAL_COMMUNITY): Payer: Self-pay | Admitting: *Deleted

## 2015-01-24 ENCOUNTER — Inpatient Hospital Stay (HOSPITAL_COMMUNITY): Payer: Medicare HMO | Admitting: Anesthesiology

## 2015-01-24 ENCOUNTER — Inpatient Hospital Stay (HOSPITAL_COMMUNITY): Payer: Medicare HMO

## 2015-01-24 DIAGNOSIS — Z86010 Personal history of colonic polyps: Secondary | ICD-10-CM | POA: Diagnosis not present

## 2015-01-24 DIAGNOSIS — M1990 Unspecified osteoarthritis, unspecified site: Secondary | ICD-10-CM | POA: Diagnosis present

## 2015-01-24 DIAGNOSIS — Z9689 Presence of other specified functional implants: Secondary | ICD-10-CM

## 2015-01-24 DIAGNOSIS — I739 Peripheral vascular disease, unspecified: Secondary | ICD-10-CM | POA: Diagnosis present

## 2015-01-24 DIAGNOSIS — C3491 Malignant neoplasm of unspecified part of right bronchus or lung: Secondary | ICD-10-CM | POA: Diagnosis present

## 2015-01-24 DIAGNOSIS — F17210 Nicotine dependence, cigarettes, uncomplicated: Secondary | ICD-10-CM | POA: Diagnosis present

## 2015-01-24 DIAGNOSIS — C342 Malignant neoplasm of middle lobe, bronchus or lung: Secondary | ICD-10-CM | POA: Diagnosis present

## 2015-01-24 DIAGNOSIS — L821 Other seborrheic keratosis: Secondary | ICD-10-CM | POA: Diagnosis present

## 2015-01-24 DIAGNOSIS — Z7902 Long term (current) use of antithrombotics/antiplatelets: Secondary | ICD-10-CM

## 2015-01-24 DIAGNOSIS — R911 Solitary pulmonary nodule: Secondary | ICD-10-CM | POA: Diagnosis present

## 2015-01-24 DIAGNOSIS — Z86718 Personal history of other venous thrombosis and embolism: Secondary | ICD-10-CM | POA: Diagnosis not present

## 2015-01-24 DIAGNOSIS — Q859 Phakomatosis, unspecified: Secondary | ICD-10-CM

## 2015-01-24 DIAGNOSIS — K219 Gastro-esophageal reflux disease without esophagitis: Secondary | ICD-10-CM | POA: Diagnosis present

## 2015-01-24 DIAGNOSIS — K5790 Diverticulosis of intestine, part unspecified, without perforation or abscess without bleeding: Secondary | ICD-10-CM | POA: Diagnosis present

## 2015-01-24 DIAGNOSIS — F329 Major depressive disorder, single episode, unspecified: Secondary | ICD-10-CM | POA: Diagnosis present

## 2015-01-24 DIAGNOSIS — I10 Essential (primary) hypertension: Secondary | ICD-10-CM | POA: Diagnosis present

## 2015-01-24 DIAGNOSIS — J449 Chronic obstructive pulmonary disease, unspecified: Secondary | ICD-10-CM | POA: Diagnosis present

## 2015-01-24 DIAGNOSIS — Z4682 Encounter for fitting and adjustment of non-vascular catheter: Secondary | ICD-10-CM

## 2015-01-24 DIAGNOSIS — Z981 Arthrodesis status: Secondary | ICD-10-CM | POA: Diagnosis not present

## 2015-01-24 DIAGNOSIS — E440 Moderate protein-calorie malnutrition: Secondary | ICD-10-CM | POA: Insufficient documentation

## 2015-01-24 DIAGNOSIS — J8410 Pulmonary fibrosis, unspecified: Secondary | ICD-10-CM | POA: Diagnosis present

## 2015-01-24 DIAGNOSIS — Z7982 Long term (current) use of aspirin: Secondary | ICD-10-CM

## 2015-01-24 DIAGNOSIS — E785 Hyperlipidemia, unspecified: Secondary | ICD-10-CM | POA: Diagnosis present

## 2015-01-24 HISTORY — PX: VIDEO ASSISTED THORACOSCOPY (VATS)/WEDGE RESECTION: SHX6174

## 2015-01-24 SURGERY — VIDEO ASSISTED THORACOSCOPY (VATS)/WEDGE RESECTION
Anesthesia: General | Site: Chest | Laterality: Right

## 2015-01-24 MED ORDER — LIDOCAINE HCL (CARDIAC) 20 MG/ML IV SOLN
INTRAVENOUS | Status: DC | PRN
  Administered 2015-01-24: 20 mg via INTRAVENOUS

## 2015-01-24 MED ORDER — NALOXONE HCL 0.4 MG/ML IJ SOLN
0.4000 mg | INTRAMUSCULAR | Status: DC | PRN
  Filled 2015-01-24: qty 1

## 2015-01-24 MED ORDER — BISACODYL 5 MG PO TBEC
10.0000 mg | DELAYED_RELEASE_TABLET | Freq: Every day | ORAL | Status: DC
  Administered 2015-01-25 – 2015-01-27 (×3): 10 mg via ORAL
  Filled 2015-01-24 (×3): qty 2

## 2015-01-24 MED ORDER — LACTATED RINGERS IV SOLN
INTRAVENOUS | Status: DC
  Administered 2015-01-24: 19:00:00 via INTRAVENOUS
  Administered 2015-01-24: 75 mL/h via INTRAVENOUS
  Administered 2015-01-24 – 2015-01-25 (×2): via INTRAVENOUS

## 2015-01-24 MED ORDER — MIDAZOLAM HCL 2 MG/2ML IJ SOLN
INTRAMUSCULAR | Status: AC
  Filled 2015-01-24: qty 4

## 2015-01-24 MED ORDER — HYDROMORPHONE HCL 1 MG/ML IJ SOLN
INTRAMUSCULAR | Status: AC
  Filled 2015-01-24: qty 1

## 2015-01-24 MED ORDER — ROCURONIUM BROMIDE 50 MG/5ML IV SOLN
INTRAVENOUS | Status: AC
  Filled 2015-01-24: qty 2

## 2015-01-24 MED ORDER — SENNOSIDES-DOCUSATE SODIUM 8.6-50 MG PO TABS
1.0000 | ORAL_TABLET | Freq: Every day | ORAL | Status: DC
  Administered 2015-01-24 – 2015-01-26 (×3): 1 via ORAL
  Filled 2015-01-24 (×4): qty 1

## 2015-01-24 MED ORDER — GLYCOPYRROLATE 0.2 MG/ML IJ SOLN
INTRAMUSCULAR | Status: DC | PRN
  Administered 2015-01-24: .4 mg via INTRAVENOUS

## 2015-01-24 MED ORDER — ONDANSETRON HCL 4 MG/2ML IJ SOLN: 4.0000 mg | Freq: Four times a day (QID) | INTRAMUSCULAR | Status: DC | PRN

## 2015-01-24 MED ORDER — LACTATED RINGERS IV SOLN
INTRAVENOUS | Status: DC | PRN
  Administered 2015-01-24: 07:00:00 via INTRAVENOUS

## 2015-01-24 MED ORDER — MIDAZOLAM HCL 2 MG/2ML IJ SOLN: 0.5000 mg | Freq: Once | INTRAMUSCULAR | Status: DC | PRN

## 2015-01-24 MED ORDER — 0.9 % SODIUM CHLORIDE (POUR BTL) OPTIME
TOPICAL | Status: DC | PRN
  Administered 2015-01-24: 3000 mL

## 2015-01-24 MED ORDER — BUPIVACAINE HCL (PF) 0.5 % IJ SOLN
INTRAMUSCULAR | Status: DC | PRN
  Administered 2015-01-24: 5 mL

## 2015-01-24 MED ORDER — MEPERIDINE HCL 25 MG/ML IJ SOLN: 6.2500 mg | INTRAMUSCULAR | Status: DC | PRN

## 2015-01-24 MED ORDER — SODIUM CHLORIDE 0.9 % IJ SOLN: 9.0000 mL | INTRAMUSCULAR | Status: DC | PRN

## 2015-01-24 MED ORDER — PHENYLEPHRINE HCL 10 MG/ML IJ SOLN
10.0000 mg | INTRAVENOUS | Status: DC | PRN
  Administered 2015-01-24: 40 ug/min via INTRAVENOUS

## 2015-01-24 MED ORDER — POTASSIUM CHLORIDE 10 MEQ/50ML IV SOLN
10.0000 meq | Freq: Every day | INTRAVENOUS | Status: DC | PRN
  Administered 2015-01-26 (×2): 10 meq via INTRAVENOUS
  Filled 2015-01-24 (×3): qty 50

## 2015-01-24 MED ORDER — PROPOFOL 10 MG/ML IV BOLUS
INTRAVENOUS | Status: AC
  Filled 2015-01-24: qty 20

## 2015-01-24 MED ORDER — ACETAMINOPHEN 500 MG PO TABS
1000.0000 mg | ORAL_TABLET | Freq: Four times a day (QID) | ORAL | Status: DC
  Administered 2015-01-25 – 2015-01-27 (×10): 1000 mg via ORAL
  Filled 2015-01-24 (×15): qty 2

## 2015-01-24 MED ORDER — ROCURONIUM BROMIDE 100 MG/10ML IV SOLN
INTRAVENOUS | Status: DC | PRN
  Administered 2015-01-24: 50 mg via INTRAVENOUS

## 2015-01-24 MED ORDER — BUPIVACAINE ON-Q PAIN PUMP (FOR ORDER SET NO CHG)
INJECTION | Status: DC
  Filled 2015-01-24: qty 1

## 2015-01-24 MED ORDER — PROPOFOL 10 MG/ML IV BOLUS
INTRAVENOUS | Status: DC | PRN
  Administered 2015-01-24: 110 mg via INTRAVENOUS

## 2015-01-24 MED ORDER — EPHEDRINE SULFATE 50 MG/ML IJ SOLN
INTRAMUSCULAR | Status: AC
  Filled 2015-01-24: qty 1

## 2015-01-24 MED ORDER — DIPHENHYDRAMINE HCL 12.5 MG/5ML PO ELIX
12.5000 mg | ORAL_SOLUTION | Freq: Four times a day (QID) | ORAL | Status: DC | PRN
  Filled 2015-01-24: qty 5

## 2015-01-24 MED ORDER — PANTOPRAZOLE SODIUM 40 MG PO TBEC
40.0000 mg | DELAYED_RELEASE_TABLET | Freq: Every day | ORAL | Status: DC
Start: 2015-01-24 — End: 2015-01-27
  Administered 2015-01-24 – 2015-01-27 (×4): 40 mg via ORAL
  Filled 2015-01-24 (×4): qty 1

## 2015-01-24 MED ORDER — HYDROMORPHONE HCL 1 MG/ML IJ SOLN
0.2500 mg | INTRAMUSCULAR | Status: DC | PRN
  Administered 2015-01-24 (×2): 0.5 mg via INTRAVENOUS

## 2015-01-24 MED ORDER — CETYLPYRIDINIUM CHLORIDE 0.05 % MT LIQD: 7.0000 mL | Freq: Two times a day (BID) | OROMUCOSAL | Status: DC

## 2015-01-24 MED ORDER — BUPIVACAINE 0.5 % ON-Q PUMP SINGLE CATH 400 ML
400.0000 mL | INJECTION | Status: DC
  Filled 2015-01-24: qty 400

## 2015-01-24 MED ORDER — AMLODIPINE BESYLATE 5 MG PO TABS
5.0000 mg | ORAL_TABLET | Freq: Every day | ORAL | Status: DC
  Administered 2015-01-24 – 2015-01-27 (×4): 5 mg via ORAL
  Filled 2015-01-24 (×5): qty 1

## 2015-01-24 MED ORDER — FENTANYL 40 MCG/ML IV SOLN
INTRAVENOUS | Status: DC
  Administered 2015-01-24: 30 ug via INTRAVENOUS
  Administered 2015-01-24: 11:00:00 via INTRAVENOUS
  Administered 2015-01-24: 70 ug via INTRAVENOUS
  Administered 2015-01-25: 90 ug via INTRAVENOUS
  Administered 2015-01-25: 100 ug via INTRAVENOUS
  Administered 2015-01-25: 20 ug via INTRAVENOUS
  Administered 2015-01-25: 70 ug via INTRAVENOUS
  Administered 2015-01-25: 100 ug via INTRAVENOUS
  Administered 2015-01-26: 70 ug via INTRAVENOUS
  Administered 2015-01-26 (×2): 50 ug via INTRAVENOUS
  Filled 2015-01-24: qty 25

## 2015-01-24 MED ORDER — MIDAZOLAM HCL 5 MG/5ML IJ SOLN
INTRAMUSCULAR | Status: DC | PRN
  Administered 2015-01-24 (×2): 1 mg via INTRAVENOUS

## 2015-01-24 MED ORDER — ONDANSETRON HCL 4 MG/2ML IJ SOLN
INTRAMUSCULAR | Status: AC
  Filled 2015-01-24: qty 2

## 2015-01-24 MED ORDER — LIDOCAINE HCL (CARDIAC) 20 MG/ML IV SOLN
INTRAVENOUS | Status: AC
  Filled 2015-01-24: qty 5

## 2015-01-24 MED ORDER — FENTANYL CITRATE (PF) 100 MCG/2ML IJ SOLN
INTRAMUSCULAR | Status: DC | PRN
  Administered 2015-01-24: 200 ug via INTRAVENOUS
  Administered 2015-01-24: 50 ug via INTRAVENOUS

## 2015-01-24 MED ORDER — TRAMADOL HCL 50 MG PO TABS: 50.0000 mg | ORAL_TABLET | Freq: Four times a day (QID) | ORAL | Status: DC | PRN

## 2015-01-24 MED ORDER — BUPIVACAINE HCL (PF) 0.5 % IJ SOLN
INTRAMUSCULAR | Status: AC
  Filled 2015-01-24: qty 10

## 2015-01-24 MED ORDER — DIPHENHYDRAMINE HCL 50 MG/ML IJ SOLN
12.5000 mg | Freq: Four times a day (QID) | INTRAMUSCULAR | Status: DC | PRN
  Administered 2015-01-25 (×2): 12.5 mg via INTRAVENOUS
  Filled 2015-01-24: qty 1
  Filled 2015-01-24: qty 0.25
  Filled 2015-01-24: qty 1

## 2015-01-24 MED ORDER — DEXTROSE 5 % IV SOLN
1.5000 g | Freq: Two times a day (BID) | INTRAVENOUS | Status: AC
  Administered 2015-01-24 – 2015-01-25 (×2): 1.5 g via INTRAVENOUS
  Filled 2015-01-24 (×2): qty 1.5

## 2015-01-24 MED ORDER — NEOSTIGMINE METHYLSULFATE 10 MG/10ML IV SOLN
INTRAVENOUS | Status: DC | PRN
  Administered 2015-01-24: 3 mg via INTRAVENOUS

## 2015-01-24 MED ORDER — CITALOPRAM HYDROBROMIDE 20 MG PO TABS
20.0000 mg | ORAL_TABLET | Freq: Every day | ORAL | Status: DC
  Administered 2015-01-25 – 2015-01-27 (×3): 20 mg via ORAL
  Filled 2015-01-24 (×4): qty 1

## 2015-01-24 MED ORDER — ROSUVASTATIN CALCIUM 40 MG PO TABS
40.0000 mg | ORAL_TABLET | Freq: Every day | ORAL | Status: DC
  Administered 2015-01-24 – 2015-01-26 (×3): 40 mg via ORAL
  Filled 2015-01-24 (×5): qty 1

## 2015-01-24 MED ORDER — ACETAMINOPHEN 160 MG/5ML PO SOLN
1000.0000 mg | Freq: Four times a day (QID) | ORAL | Status: DC
  Administered 2015-01-24: 1000 mg via ORAL
  Filled 2015-01-24 (×15): qty 40

## 2015-01-24 MED ORDER — OXYCODONE HCL 5 MG PO TABS
5.0000 mg | ORAL_TABLET | ORAL | Status: DC | PRN
  Administered 2015-01-24: 5 mg via ORAL
  Filled 2015-01-24: qty 2

## 2015-01-24 MED ORDER — PROMETHAZINE HCL 25 MG/ML IJ SOLN
6.2500 mg | INTRAMUSCULAR | Status: DC | PRN
Start: 2015-01-24 — End: 2015-01-24

## 2015-01-24 MED ORDER — CYCLOBENZAPRINE HCL 5 MG PO TABS: 5.0000 mg | ORAL_TABLET | Freq: Two times a day (BID) | ORAL | Status: DC | PRN

## 2015-01-24 MED ORDER — SODIUM CHLORIDE 0.9 % IJ SOLN
INTRAMUSCULAR | Status: AC
  Filled 2015-01-24: qty 10

## 2015-01-24 MED ORDER — ASPIRIN 325 MG PO TABS
325.0000 mg | ORAL_TABLET | Freq: Every morning | ORAL | Status: DC
  Administered 2015-01-24 – 2015-01-27 (×4): 325 mg via ORAL
  Filled 2015-01-24 (×5): qty 1

## 2015-01-24 MED ORDER — ONDANSETRON HCL 4 MG/2ML IJ SOLN
INTRAMUSCULAR | Status: DC | PRN
  Administered 2015-01-24: 4 mg via INTRAVENOUS

## 2015-01-24 MED ORDER — FENTANYL CITRATE (PF) 250 MCG/5ML IJ SOLN
INTRAMUSCULAR | Status: AC
  Filled 2015-01-24: qty 5

## 2015-01-24 SURGICAL SUPPLY — 88 items
ADH SKN CLS APL DERMABOND .7 (GAUZE/BANDAGES/DRESSINGS) ×1
APL SKNCLS STERI-STRIP NONHPOA (GAUZE/BANDAGES/DRESSINGS) ×1
BAG SPEC RTRVL LRG 6X4 10 (ENDOMECHANICALS)
BENZOIN TINCTURE PRP APPL 2/3 (GAUZE/BANDAGES/DRESSINGS) ×2 IMPLANT
CANISTER SUCTION 2500CC (MISCELLANEOUS) ×3 IMPLANT
CATH KIT ON Q 5IN SLV (PAIN MANAGEMENT) ×2 IMPLANT
CATH THORACIC 28FR (CATHETERS) ×1 IMPLANT
CATH THORACIC 36FR (CATHETERS) IMPLANT
CATH THORACIC 36FR RT ANG (CATHETERS) IMPLANT
CLIP TI MEDIUM 6 (CLIP) ×3 IMPLANT
CONN ST 1/4X3/8  BEN (MISCELLANEOUS) ×1
CONN ST 1/4X3/8 BEN (MISCELLANEOUS) IMPLANT
CONN Y 3/8X3/8X3/8  BEN (MISCELLANEOUS)
CONN Y 3/8X3/8X3/8 BEN (MISCELLANEOUS) IMPLANT
CONT SPEC 4OZ CLIKSEAL STRL BL (MISCELLANEOUS) ×5 IMPLANT
COVER SURGICAL LIGHT HANDLE (MISCELLANEOUS) ×2 IMPLANT
DERMABOND ADVANCED (GAUZE/BANDAGES/DRESSINGS) ×1
DERMABOND ADVANCED .7 DNX12 (GAUZE/BANDAGES/DRESSINGS) IMPLANT
DRAIN CHANNEL 28F RND 3/8 FF (WOUND CARE) IMPLANT
DRAIN CHANNEL 32F RND 10.7 FF (WOUND CARE) IMPLANT
DRAPE LAPAROSCOPIC ABDOMINAL (DRAPES) ×2 IMPLANT
DRAPE WARM FLUID 44X44 (DRAPE) ×2 IMPLANT
ELECT BLADE 6.5 EXT (BLADE) ×2 IMPLANT
ELECT REM PT RETURN 9FT ADLT (ELECTROSURGICAL) ×2
ELECTRODE REM PT RTRN 9FT ADLT (ELECTROSURGICAL) ×1 IMPLANT
GAUZE SPONGE 4X4 12PLY STRL (GAUZE/BANDAGES/DRESSINGS) ×2 IMPLANT
GLOVE SURG SIGNA 7.5 PF LTX (GLOVE) ×6 IMPLANT
GOWN STRL REUS W/ TWL LRG LVL3 (GOWN DISPOSABLE) ×2 IMPLANT
GOWN STRL REUS W/ TWL XL LVL3 (GOWN DISPOSABLE) ×2 IMPLANT
GOWN STRL REUS W/TWL LRG LVL3 (GOWN DISPOSABLE) ×6
GOWN STRL REUS W/TWL XL LVL3 (GOWN DISPOSABLE) ×4
HANDLE STAPLE ENDO GIA SHORT (STAPLE)
HEMOSTAT SURGICEL 2X14 (HEMOSTASIS) IMPLANT
KIT BASIN OR (CUSTOM PROCEDURE TRAY) ×2 IMPLANT
KIT ROOM TURNOVER OR (KITS) ×2 IMPLANT
KIT SUCTION CATH 14FR (SUCTIONS) ×2 IMPLANT
NS IRRIG 1000ML POUR BTL (IV SOLUTION) ×5 IMPLANT
PACK CHEST (CUSTOM PROCEDURE TRAY) ×2 IMPLANT
PAD ARMBOARD 7.5X6 YLW CONV (MISCELLANEOUS) ×4 IMPLANT
POUCH ENDO CATCH II 15MM (MISCELLANEOUS) IMPLANT
POUCH SPECIMEN RETRIEVAL 10MM (ENDOMECHANICALS) IMPLANT
RELOAD STAPLE 60 3.8 GOLD REG (STAPLE) IMPLANT
RELOAD STAPLER GOLD 60MM (STAPLE) ×6 IMPLANT
SCISSORS ENDO CVD 5DCS (MISCELLANEOUS) IMPLANT
SEALANT PROGEL (MISCELLANEOUS) IMPLANT
SEALANT SURG COSEAL 4ML (VASCULAR PRODUCTS) IMPLANT
SEALANT SURG COSEAL 8ML (VASCULAR PRODUCTS) IMPLANT
SOLUTION ANTI FOG 6CC (MISCELLANEOUS) ×2 IMPLANT
SPECIMEN JAR MEDIUM (MISCELLANEOUS) ×2 IMPLANT
SPONGE TONSIL 1 RF SGL (DISPOSABLE) ×2 IMPLANT
STAPLE ECHEON FLEX 60 POW ENDO (STAPLE) ×1 IMPLANT
STAPLER ENDO GIA 12 SHRT THIN (STAPLE) IMPLANT
STAPLER ENDO GIA 12MM SHORT (STAPLE) IMPLANT
STAPLER RELOAD GOLD 60MM (STAPLE) ×12
SUT PROLENE 4 0 RB 1 (SUTURE) ×2
SUT PROLENE 4-0 RB1 .5 CRCL 36 (SUTURE) IMPLANT
SUT SILK  1 MH (SUTURE) ×3
SUT SILK 1 MH (SUTURE) ×1 IMPLANT
SUT SILK 1 TIES 10X30 (SUTURE) ×2 IMPLANT
SUT SILK 2 0 SH (SUTURE) ×1 IMPLANT
SUT SILK 2 0SH CR/8 30 (SUTURE) IMPLANT
SUT SILK 3 0 SH 30 (SUTURE) ×1 IMPLANT
SUT SILK 3 0SH CR/8 30 (SUTURE) IMPLANT
SUT VIC AB 0 CTX 27 (SUTURE) IMPLANT
SUT VIC AB 1 CTX 27 (SUTURE) ×2 IMPLANT
SUT VIC AB 2-0 CT1 27 (SUTURE)
SUT VIC AB 2-0 CT1 TAPERPNT 27 (SUTURE) IMPLANT
SUT VIC AB 2-0 CTX 27 (SUTURE) ×1 IMPLANT
SUT VIC AB 2-0 CTX 36 (SUTURE) ×2 IMPLANT
SUT VIC AB 3-0 MH 27 (SUTURE) IMPLANT
SUT VIC AB 3-0 SH 27 (SUTURE)
SUT VIC AB 3-0 SH 27X BRD (SUTURE) IMPLANT
SUT VIC AB 3-0 X1 27 (SUTURE) ×2 IMPLANT
SUT VICRYL 0 UR6 27IN ABS (SUTURE) ×2 IMPLANT
SUT VICRYL 2 TP 1 (SUTURE) IMPLANT
SWAB COLLECTION DEVICE MRSA (MISCELLANEOUS) IMPLANT
SYSTEM SAHARA CHEST DRAIN ATS (WOUND CARE) ×2 IMPLANT
TAPE CLOTH SURG 4X10 WHT LF (GAUZE/BANDAGES/DRESSINGS) ×1 IMPLANT
TIP APPLICATOR SPRAY EXTEND 16 (VASCULAR PRODUCTS) IMPLANT
TOWEL OR 17X24 6PK STRL BLUE (TOWEL DISPOSABLE) ×2 IMPLANT
TOWEL OR 17X26 10 PK STRL BLUE (TOWEL DISPOSABLE) ×3 IMPLANT
TRAP SPECIMEN MUCOUS 40CC (MISCELLANEOUS) ×1 IMPLANT
TRAY FOLEY CATH 16FRSI W/METER (SET/KITS/TRAYS/PACK) ×2 IMPLANT
TROCAR XCEL BLADELESS 5X75MML (TROCAR) ×2 IMPLANT
TROCAR XCEL NON-BLD 5MMX100MML (ENDOMECHANICALS) ×1 IMPLANT
TUBE ANAEROBIC SPECIMEN COL (MISCELLANEOUS) IMPLANT
TUNNELER SHEATH ON-Q 11GX8 DSP (PAIN MANAGEMENT) ×1 IMPLANT
WATER STERILE IRR 1000ML POUR (IV SOLUTION) ×2 IMPLANT

## 2015-01-24 NOTE — Anesthesia Postprocedure Evaluation (Signed)
  Anesthesia Post-op Note  Patient: Timothy Wood  Procedure(s) Performed: Procedure(s): RIGHT VIDEO ASSISTED THORACOSCOPY (VATS)/WEDGE RESECTION (Right)  Patient Location: PACU  Anesthesia Type:General  Level of Consciousness: awake, alert , oriented and patient cooperative  Airway and Oxygen Therapy: Patient Spontanous Breathing and Patient connected to nasal cannula oxygen  Post-op Pain: none  Post-op Assessment: Post-op Vital signs reviewed, Patient's Cardiovascular Status Stable, Respiratory Function Stable, Patent Airway, No signs of Nausea or vomiting and Pain level controlled              Post-op Vital Signs: Reviewed and stable  Last Vitals:  Filed Vitals:   01/24/15 1618  BP: 122/56  Pulse: 70  Temp:   Resp: 13    Complications: No apparent anesthesia complications

## 2015-01-24 NOTE — Care Management Note (Signed)
Case Management Note  Patient Details  Name: Timothy Wood MRN: [id number] Date of Birth: 02-17-1941  Subjective/Objective:                 Admitted with  RML NODULE, s/p VATS with wedge resection. Independent with ADL's. Lives alone.   Action/Plan: Return to home when medically stable. CM to f/u with disposition needs.  Expected Discharge Date:                Expected Discharge Plan:  Home/Self Care  In-House Referral:     Discharge planning Services  CM Consult  Post Acute Care Choice:    Choice offered to:     DME Arranged:    DME Agency:     HH Arranged:    HH Agency:     Status of Service:  In process, will continue to follow  Medicare Important Message Given:    Date Medicare IM Given:    Medicare IM give by:    Date Additional Medicare IM Given:    Additional Medicare Important Message give by:     If discussed at [place] of Stay Meetings, dates discussed:    Additional Comments:   [name] (Daughter)  [phone]  [facility] [place], [place] [phone] 01/24/2015, 7:05 PM

## 2015-01-24 NOTE — Progress Notes (Signed)
Utilization review completed. Jorgia Manthei, RN, BSN. 

## 2015-01-24 NOTE — Anesthesia Procedure Notes (Signed)
Procedure Name: Intubation Date/Time: 01/24/2015 8:12 AM Performed by: Carney Living Pre-anesthesia Checklist: Patient identified, Emergency Drugs available, Suction available and Patient being monitored Patient Re-evaluated:Patient Re-evaluated prior to inductionOxygen Delivery Method: Circle system utilized Preoxygenation: Pre-oxygenation with 100% oxygen Intubation Type: IV induction Ventilation: Oral airway inserted - appropriate to patient size Laryngoscope Size: Mac and 4 Grade View: Grade I Tube type: Oral Endobronchial tube: Left, Double lumen EBT, EBT position confirmed by auscultation and EBT position confirmed by fiberoptic bronchoscope and 39 Fr Number of attempts: 1 Airway Equipment and Method: Stylet and Oral airway Placement Confirmation: ETT inserted through vocal cords under direct vision,  positive ETCO2 and breath sounds checked- equal and bilateral Tube secured with: Tape Dental Injury: Teeth and Oropharynx as per pre-operative assessment  Comments: Intubation performed by Reola Calkins, SRNA

## 2015-01-24 NOTE — H&P (View-Only) (Signed)
PCP is [name], DO Referring Provider is Tanda Rockers, MD  Chief Complaint  Patient presents with  . Lung Lesion    Surgical eval, Chest CT 12/28/14, last PET Scan 07/11/14    HPI: Timothy Wood is a 74 year old smoker sent for consultation regarding a groundglass opacity in the right lung. He has a past medical history significant for hypertension, hyperlipidemia, tobacco abuse (60+ pack years), peripheral vascular disease, DVT after knee surgery, COPD, GERD, colonic polyps, and multiple pulmonary nodules followed by CT.  He recently had a 6 month followup CT. That showed multiple stable granulomas. There was a groundglass opacity in the right upper lobe which was unchanged. However, there is a groundglass opacity in the right middle lobe is very slowly grown over time. It was first noted back in 2010 is about 4 mm that time. He now has continued to grow and is 1.7 x 1.0 cm. There is no mediastinal or hilar adenopathy.  He has been feeling poorly recently. He has lost 30 pounds over the past 6 months and 15 pounds over the past 3 months. He chooses to poor appetite and depression since his wife's death from lung cancer in 2022-04-02. He was having dizzy spells in July and August and actually passed out one time. He denies any recent dizzy spells but is daughter so that he has had some. Complains of fatigue and general malaise. He continues to smoke a pack of cigarettes daily. He has no interest in quitting. He denies any chest pain, pressure, or tightness. He's not had any headaches or visual changes.  Zubrod Score: At the time of surgery this patient's most appropriate activity status/level should be described as: '[x]'$     0    Normal activity, no symptoms '[]'$     1    Restricted in physical strenuous activity but ambulatory, able to do out light work '[]'$     2    Ambulatory and capable of self care, unable to do work activities, up and about >50 % of waking hours                              '[]'$     3     Only limited self care, in bed greater than 50% of waking hours '[]'$     4    Completely disabled, no self care, confined to bed or chair '[]'$     5    Moribund   Past Medical History  Diagnosis Date  . Hypertension   . Tobacco abuse   . Pulmonary nodule 06/2006    9 mm right upper obe pulmonary nodule (negative Pet Scan)  . Personal history of colonic polyps 2008    multiple adenomas 2008-2009 exams with large cecal adenoma  . Hyperlipidemia   . PVD (peripheral vascular disease) ([place])     left leg s/p stent  . Seborrheic keratosis   . Sebaceous cyst     neck  . Sinusitis   . Blood clotting disorder ([place])   . Diverticulosis   . Chronic obstructive pulmonary disease (COPD) ([place])   . GERD (gastroesophageal reflux disease)     Past Surgical History  Procedure Laterality Date  . Back surgery  1975, 1978    x2 lumbar fusion over 30 years ago  . Knee surgery  2002    left  . Tonsillectomy  1956  . Angioplasty / stenting iliac  08/2008    Dr.  Brabham  . Descending aortic aneurysm repair w/ stent  04/2010    Dr. Trula Slade  . Colonoscopy  05/11/2006; 07/27/2006;05/05/2007, 11/24/2010    adenomas with 9 polyps index and 2.5 cm cecal polyp removed over 3 exams, last 2009 with APC. diverticulosis also.  2012: 12 adenomas removed, largest 8-10 mm, diverticulosis  . Esophagogastroduodenoscopy  05/05/2007    GERD - one erosion  . Percutaneous pinning Left 06/24/2012    Procedure: Repair Complex Laceration Left Thumb with percutaneous pinning.;  Surgeon: Dennie Bible, MD;  Location: [facility];  Service: Plastics;  Laterality: Left;    Family History  Problem Relation Age of Onset  . Colon cancer Mother 51  . Deep vein thrombosis Mother   . Heart disease Brother   . Diabetes    . Lung cancer Son     smoked and was a Building control surveyor  . Heart disease Brother   . Rheum arthritis Daughter   . Rheum arthritis Sister     Social History Social History  Substance Use Topics  . Smoking status: Current  Every Day Smoker -- 0.75 packs/day for 45 years    Types: Cigarettes  . Smokeless tobacco: Never Used  . Alcohol Use: No    Current Outpatient Prescriptions  Medication Sig Dispense Refill  . amLODipine (NORVASC) 5 MG tablet TAKE 1 TABLET (5 MG TOTAL) BY MOUTH DAILY. 90 tablet 1  . aspirin 325 MG tablet Take 325 mg by mouth every morning.    . citalopram (CELEXA) 20 MG tablet Take 1 tablet (20 mg total) by mouth daily. 30 tablet 3  . clopidogrel (PLAVIX) 75 MG tablet TAKE 1 TABLET (75 MG TOTAL) BY MOUTH EVERY MORNING. 90 tablet 1  . CRESTOR 40 MG tablet TAKE 1 TABLET (40 MG TOTAL) BY MOUTH DAILY. 30 tablet 1  . cyclobenzaprine (FLEXERIL) 5 MG tablet Take 1 tablet (5 mg total) by mouth 2 (two) times daily as needed for muscle spasms. 60 tablet 3  . irbesartan-hydrochlorothiazide (AVALIDE) 150-12.5 MG per tablet Take 1 tablet by mouth daily. 90 tablet 1  . omeprazole (PRILOSEC) 20 MG capsule TAKE 1 CAPSULE (20 MG TOTAL) BY MOUTH 2 (TWO) TIMES DAILY BEFORE A MEAL. 180 capsule 1   No current facility-administered medications for this visit.    Allergies  Allergen Reactions  . Hydrocodone     Review of Systems  Constitutional: Positive for activity change, appetite change, fatigue and unexpected weight change (30 pound weight loss in 6 months, 15 pounds in 3 months). Negative for fever.  HENT: Positive for dental problem (Dentures). Negative for trouble swallowing.   Eyes: Negative for photophobia and visual disturbance.  Respiratory: Positive for cough (occasional mild) and shortness of breath (With heavy exertion). Negative for wheezing.   Cardiovascular: Negative for chest pain, palpitations and leg swelling.  Gastrointestinal: Negative for abdominal pain and blood in stool.       History of polyps  Endocrine: Negative for cold intolerance and heat intolerance.  Genitourinary: Negative for dysuria and difficulty urinating.  Musculoskeletal: Positive for myalgias, back pain and  arthralgias.  Neurological: Positive for dizziness and syncope (1 episode).  Hematological: Negative for adenopathy. Bruises/bleeds easily.       History DVT after knee surgery  Psychiatric/Behavioral: Positive for dysphoric mood. The patient is nervous/anxious.   All other systems reviewed and are negative.   BP 139/76 mmHg  Pulse 80  Resp 20  Ht '5\' 9"'$  (1.753 m)  Wt 156 lb (70.761 kg)  BMI  23.03 kg/m2  SpO2 97% Physical Exam  Constitutional: He is oriented to person, place, and time. He appears well-developed and well-nourished. No distress.  HENT:  Head: Normocephalic and atraumatic.  Mouth/Throat: No oropharyngeal exudate.  Eyes: Conjunctivae and EOM are normal. No scleral icterus.  Neck: Normal carotid pulses present. Carotid bruit is not present. No tracheal deviation present. No thyroid mass and no thyromegaly present.  Cardiovascular: Normal rate, regular rhythm, S1 normal and S2 normal.  PMI is not displaced.  Exam reveals no S3 and no S4.   No murmur heard.  No systolic murmur is present  Pulses:      Carotid pulses are 2+ on the right side, and 2+ on the left side.      Dorsalis pedis pulses are 2+ on the right side, and 0 on the left side.       Posterior tibial pulses are 2+ on the right side, and 0 on the left side.  Pulmonary/Chest: Effort normal and breath sounds normal. No respiratory distress. He has no wheezes. He has no rales.  Abdominal: Soft. Bowel sounds are normal. He exhibits no distension. There is no tenderness.  Musculoskeletal: Normal range of motion. He exhibits no edema.  Lymphadenopathy:    He has no cervical adenopathy.  Neurological: He is alert and oriented to person, place, and time. No cranial nerve deficit. He exhibits normal muscle tone. Coordination normal.  Skin: Skin is warm and dry. No rash noted.  Psychiatric: He has a normal mood and affect.  Vitals reviewed.    Diagnostic Tests: CT CHEST WITHOUT  CONTRAST  TECHNIQUE: Multidetector CT imaging of the chest was performed following the standard protocol without IV contrast.  COMPARISON: Multiple exams, including 07/11/2014 an 07/03/2013  FINDINGS: Mediastinum/Nodes: Coronary, aortic arch, and branch vessel atherosclerotic vascular disease. No pathologic thoracic adenopathy.  Lungs/Pleura: Ground-glass nodule in the right middle lobe on image 42 series 3 is again noted. The measurements of this lesion over time are as follows:  12/28/2014: 1.7 by 1.0 cm  07/11/2014: 1.7 by 0.9 cm  02/15/2013: 1.4 by 0.8 cm  06/06/2008: 0.4 by 0.4 cm  Calcified granulomas observed in the right upper lobe, with a 5 mm noncalcified nodule in the right upper lobe on image 27 series 3 not appreciably changed from 2014.  Upper abdomen: Chronic and stable hypodense hepatic lesions, likely cysts. We partially image the right kidney upper pole cyst.  Musculoskeletal: Unremarkable  IMPRESSION: 1. There has been slow but convincing growth of the ground-glass nodule in the right middle lobe over the past 6 years. For example, comparing image 31 of series 603 of today's exam to image 24 series 7 of the exam from 06/06/2008 shows clear progression. This probably represents a low grade adenocarcinoma. 2. Coronary, aortic arch, and branch vessel atherosclerotic vascular disease. 3. Calcified granulomas in the right upper lobe. Noncalcified 5 mm right upper lobe lesion is stable from 2014 and probably benign. The smaller lesion has enlarged from 06/06/2008, but I suspect that this enlargement is due to being an active granuloma at that time.   Electronically Signed  By: Van Clines M.D.  On: 12/28/2014 14:10  I personally reviewed the CT chest and concur with findings as noted above  Impression:  74 year old male with a history of heavy tobacco abuse dating from age 89. He currently is smoking one pack daily. He has  had multiple lung nodules followed by CT. Several these nodules are calcified granulomas and have been stable over  time. There are 2 groundglass opacities, one in the right upper lobe and one in the right middle lobe. Right upper lobe has been relatively stable but the one in the right middle lobe has slowly grown over time. This is highly concerning for a low-grade adenocarcinoma.   I discussed our options with Timothy Wood and his daughter. They include bronchoscopic or CT-guided biopsy. I would not recommend either one of those options, because if negative they could not be relied on to rule out cancer. Given that this nodule has grown over time, I think the best option is to proceed with surgical resection. A wedge resection would be both definitively diagnostic as well as therapeutic for the right middle lobe lesion and, if it is possible to find it, the right upper lobe lesion as well.  I described the proposed operation, right VATS, wedge resection, to Timothy Wood and his daughter. They understand the general nature of the procedure, the incisions to be used, expected hospital stay, and overall recovery. I reviewed the indications, risks, benefits, and alternatives. They understand the risks include, but are not limited to death, MI, DVT, PE, bleeding, possible need for transfusion, infection, air leaks, cardiac arrhythmias, as well as the possibility of other unforeseeable complications.  He understands and accepts the risks and agrees to proceed.  He is on Plavix for peripheral arterial stents. He needs to be off of that for 7 days prior surgery.  I did inform them that he does have coronary atherosclerosis noted on CT and PET CT. He is not having any anginal symptoms so no further workup is indicated prior to surgery.  Smoking cessation instruction/counseling given:  counseled patient on the dangers of tobacco use, advised patient to stop smoking, and reviewed strategies to maximize success. He  expresses contempt at the mention of discontinuing tobacco.  Plan: Right VATS, wedge resection right middle lobe nodule, possible wedge resection of right upper lobe nodule on Thursday, 01/24/2015. He will discontinue Plavix after his dose on 01/17/2015.  [name], MD [facility] [phone]

## 2015-01-24 NOTE — Transfer of Care (Signed)
Immediate Anesthesia Transfer of Care Note  Patient: Timothy Wood  Procedure(s) Performed: Procedure(s): RIGHT VIDEO ASSISTED THORACOSCOPY (VATS)/WEDGE RESECTION (Right)  Patient Location: PACU  Anesthesia Type:General  Level of Consciousness: awake, alert , oriented and patient cooperative  Airway & Oxygen Therapy: Patient Spontanous Breathing and Patient connected to nasal cannula oxygen  Post-op Assessment: Report given to RN, Post -op Vital signs reviewed and stable and Patient moving all extremities X 4  Post vital signs: Reviewed and stable  Last Vitals:  Filed Vitals:   01/24/15 0654  BP:   Pulse:   Temp: 36.2 C  Resp:     Complications: No apparent anesthesia complications

## 2015-01-24 NOTE — Brief Op Note (Addendum)
01/24/2015  10:13 AM  PATIENT:  Timothy Wood  74 y.o. male  PRE-OPERATIVE DIAGNOSIS:  RIGHT UPPER AND MIDDLE LOBE LUNG NODULES  POST-OPERATIVE DIAGNOSIS: LOW GRADE ADENOCARCINOMA RIGHT MIDDLE LOBE, RIGHT UPPER LOBE GRANULOMA  PROCEDURE:   RIGHT VIDEO ASSISTED THORACOSCOPY  RIGHT UPPER AND MIDDLE LOBE WEDGE RESECTIONS  PLEURAL BIOPSY  SURGEON:  [name], MD   ASSISTANTS: Suzzanne Cloud, PA-C   ANESTHESIA:   general   DRAINS: 56 Blake drain   SPECIMEN:  Source of Specimen:  Right upper and middle lobe wedge resections, pleural plaque  DISPOSITION OF SPECIMEN:  PATHOLOGY  PATIENT DISPOSITION:  PACU - hemodynamically stable.

## 2015-01-24 NOTE — Interval H&P Note (Signed)
History and Physical Interval Note:  01/24/2015 7:37 AM  Timothy Wood  has presented today for surgery, with the diagnosis of RML NODULE  The various methods of treatment have been discussed with the patient and family. After consideration of risks, benefits and other options for treatment, the patient has consented to  Procedure(s): VIDEO ASSISTED THORACOSCOPY (VATS)/WEDGE RESECTION (Right) as a surgical intervention .  The patient's history has been reviewed, patient examined, no change in status, stable for surgery.  I have reviewed the patient's chart and labs.  Questions were answered to the patient's satisfaction.     [name]

## 2015-01-25 ENCOUNTER — Inpatient Hospital Stay (HOSPITAL_COMMUNITY): Payer: Medicare HMO

## 2015-01-25 LAB — BASIC METABOLIC PANEL
Anion gap: 7 (ref 5–15)
BUN: 6 mg/dL (ref 6–20)
CO2: 26 mmol/L (ref 22–32)
Calcium: 7.8 mg/dL — ABNORMAL LOW (ref 8.9–10.3)
Chloride: 102 mmol/L (ref 101–111)
Creatinine, Ser: 0.68 mg/dL (ref 0.61–1.24)
Glucose, Bld: 86 mg/dL (ref 65–99)
POTASSIUM: 3.8 mmol/L (ref 3.5–5.1)
SODIUM: 135 mmol/L (ref 135–145)

## 2015-01-25 LAB — CBC
HEMATOCRIT: 39 % (ref 39.0–52.0)
HEMOGLOBIN: 12.8 g/dL — AB (ref 13.0–17.0)
MCH: 32 pg (ref 26.0–34.0)
MCHC: 32.8 g/dL (ref 30.0–36.0)
MCV: 97.5 fL (ref 78.0–100.0)
Platelets: 146 10*3/uL — ABNORMAL LOW (ref 150–400)
RBC: 4 MIL/uL — AB (ref 4.22–5.81)
RDW: 13.2 % (ref 11.5–15.5)
WBC: 7.7 10*3/uL (ref 4.0–10.5)

## 2015-01-25 LAB — BLOOD GAS, ARTERIAL
Acid-Base Excess: 1.6 mmol/L (ref 0.0–2.0)
BICARBONATE: 25.8 meq/L — AB (ref 20.0–24.0)
DRAWN BY: 437071
O2 Content: 1 L/min
O2 SAT: 94.7 %
Patient temperature: 98.6
TCO2: 27.1 mmol/L (ref 0–100)
pCO2 arterial: 42.3 mmHg (ref 35.0–45.0)
pH, Arterial: 7.403 (ref 7.350–7.450)
pO2, Arterial: 69.4 mmHg — ABNORMAL LOW (ref 80.0–100.0)

## 2015-01-25 MED ORDER — CLOPIDOGREL BISULFATE 75 MG PO TABS
75.0000 mg | ORAL_TABLET | Freq: Every day | ORAL | Status: DC
  Administered 2015-01-25 – 2015-01-27 (×3): 75 mg via ORAL
  Filled 2015-01-25 (×3): qty 1

## 2015-01-25 NOTE — Op Note (Signed)
NAMECHEN, Timothy Wood NO.:  [id number]  MEDICAL RECORD NO.:  [id number]  LOCATION:  3S15C                        FACILITY:  [facility]  PHYSICIAN:  Revonda Standard. Roxan Hockey, M.D.DATE OF BIRTH:  [birth year]  DATE OF PROCEDURE:  01/24/2015 DATE OF DISCHARGE:                              OPERATIVE REPORT   PREOPERATIVE DIAGNOSIS:  Right upper and middle lobe lung nodules.  POSTOPERATIVE DIAGNOSES:  Low-grade adenocarcinoma in situ in right middle lobe. Right upper lobe hamartoma and granuloma.  PROCEDURE:   Right video-assisted thoracoscopy Wedge resection, right middle lobe Wedge resection, right upper lobe Pleural biopsy On-Q local anesthetic catheter placement.  SURGEON:  Revonda Standard. Roxan Hockey, M.D.  ASSISTANT:  Suzzanne Cloud, PA  ANESTHESIA:  General.  FINDINGS:  Palpable nodule along the major fissure in the right middle lobe.  Wedge resection revealed adenocarcinoma in situ.  Multiple palpable nodules in the right upper lobe in proximity to a ground-glass opacity noted on CT scan.  Wedge resection performed of this area, one of the nodules was a hamartoma and one was a necrotizing granuloma.  CLINICAL NOTE:  Timothy Wood is a 74 year old man with a history of tobacco abuse, who has had a ground-glass opacity in the right middle lobe dating back to as far as 2010.  On a recent CT, the nodule had increased in size.  Due to this continued growth, he was advised to undergo surgical resection.  There also was a stable ground-glass opacity in the right upper lobe that we discussed attempting to remove as well, but I was unsure if we would be able to identify this area in the operating room.  The indications, risks, benefits, and alternatives were discussed in detail with the patient.  He understood and accepted the risks and agreed to proceed.  OPERATIVE NOTE:  Timothy Wood was brought to the preoperative holding area on January 24, 2015.  Anesthesia placed a  central line and an arterial blood pressure monitoring line.  He was taken to the operating room, anesthetized, and intubated.  A Foley catheter was placed. Sequential compression devices were placed on the calves for DVT prophylaxis.  Intravenous antibiotics were administered.  He was placed in a left lateral decubitus position and the right chest was prepped and draped in usual sterile fashion. Single lung ventilation of the left lung was initiated and was tolerated well throughout the procedure.  An incision was made in the seventh intercostal space in the midaxillary line and a 5 mm port was inserted into the right chest.  The thoracoscope was advanced into the chest.  There was no pleural effusion.  There was some plaque on the parietal pleural surface.  This appeared benign.  The lung was initially slow to deflate, but when suction was applied, it deflated nicely.  A 5 cm working incision was made in the fourth interspace anterolaterally. No rib spreading was performed during the procedure.  A representative area of pleural plaque was biopsied and was sent for permanent pathology.  The right middle lobe was palpated, there was a clearly palpable nodularity on the major fissure, relatively laterally in the middle lobe, consistent with the ground-glass opacity on CT.  A wedge  resection was performed with 1 cm gross margins with sequential firings of an Eth[employer] powered endoscopic GIA stapler.  Yellow cartridges were used.  The specimen was removed and sent for frozen section.  While awaiting the results of this frozen section, the right upper lobe was palpated. Some nodules were easily palpable consistent with what were felt to be granulomas on the CT scan.  The ground-glass opacity was just anterior to these nodules.  A wedge resection was performed incorporating these nodules and the area anterior to them.  Suspicious area was marked with a suture and the specimen was sent for frozen  section.  An On-Q local anesthetic catheter was tunneled through a separate incision posteriorly into a subpleural location and primed with 5 mL of 0.5% Marcaine.  The frozen section of the middle lobe returned showing adenocarcinoma in situ with clear margins.  The frozen section from the upper lobe showed suspicion for necrotizing granuloma. Pathology will send AFB and fungal cultures while we await the final pathology.  A final inspection was made. A 28 F chest tube was placed through the original port incision. There was good hemostasis at the staple lines.  The right lung was reinflated.  The chest tube was placed to suction.  The working incision was closed with #1 Vicryl fascial suture, followed by 2-0 Vicryl subcutaneous suture, and a 3-0 Vicryl subcuticular suture.  The patient was extubated in the operating room and taken to the postanesthetic care unit in good condition.     Revonda Standard Roxan Hockey, M.D.     SCH/MEDQ  D:  01/24/2015  T:  01/25/2015  Job:  [id number]

## 2015-01-25 NOTE — Progress Notes (Signed)
Initial Nutrition Assessment  DOCUMENTATION CODES:   Non-severe (moderate) malnutrition in context of chronic illness  INTERVENTION:    High protein snack daily to increase protein and calorie intake.  Encouraged intake of meals and snacks.  NUTRITION DIAGNOSIS:   Malnutrition related to chronic illness as evidenced by mild depletion of body fat, mild depletion of muscle mass.  GOAL:   Patient will meet greater than or equal to 90% of their needs  MONITOR:   PO intake, Weight trends, Labs, I & O's  REASON FOR ASSESSMENT:   Malnutrition Screening Tool    ASSESSMENT:   Patient admitted on 11/10 S/P left VATS with wedge resection for lung CA nodules.   Chest tube in place. Diet has been advanced to heart healthy. Patient reports that he usually eats one big meal per day. He seems depressed and states that he doesn't care about eating. He refuses PO supplements. Agreed to snack once daily. He has lost weight over the past 2 years because he lost his wife and a child within that time frame. Nutrition-Focused physical exam completed. Findings are mild-moderate fat depletion, mild-moderate muscle depletion, and no edema. Patient with moderate PCM in the context of chronic illness.   Diet Order:  Diet Heart Room service appropriate?: Yes; Fluid consistency:: Thin  Skin:  Reviewed, no issues  Last BM:  unknown  Height:   Ht Readings from Last 1 Encounters:  01/24/15 '5\' 9"'$  (1.753 m)    Weight:   Wt Readings from Last 1 Encounters:  01/24/15 153 lb (69.4 kg)    Ideal Body Weight:  72.7 kg  BMI:  Body mass index is 22.58 kg/(m^2).  Estimated Nutritional Needs:   Kcal:  2000-2200  Protein:  105-125 gm  Fluid:  2 L  EDUCATION NEEDS:   Education needs addressed  Molli Barrows, [place], [place], [place] Pager [phone] After Hours Pager [phone]

## 2015-01-25 NOTE — Progress Notes (Addendum)
[address]       [place] [address]             [phone]          1 Day Post-Op Procedure(s) (LRB): RIGHT VIDEO ASSISTED THORACOSCOPY (VATS)/WEDGE RESECTION (Right)  Subjective: Feels well, pain controlled, breathing stable.   Objective: Vital signs in last 24 hours: Patient Vitals for the past 24 hrs:  BP Temp Temp src Pulse Resp SpO2 Height Weight  01/25/15 0400 - - - 73 20 97 % - -  01/25/15 0352 (!) 113/57 mmHg 98.8 F (37.1 C) Oral 74 15 93 % - -  01/25/15 0000 - - - 67 14 98 % - -  01/24/15 2323 (!) 111/58 mmHg 97.9 F (36.6 C) Oral 76 17 99 % - -  01/24/15 2012 117/60 mmHg 98.3 F (36.8 C) Oral 72 12 99 % - -  01/24/15 2000 - - - 71 12 99 % - -  01/24/15 1724 122/66 mmHg - - - - - - -  01/24/15 1618 (!) 122/56 mmHg 98 F (36.7 C) Oral 70 13 100 % - -  01/24/15 1600 - - - - 12 99 % - -  01/24/15 1422 124/66 mmHg 97.5 F (36.4 C) Oral 94 18 98 % '5\' 9"'$  (1.753 m) 153 lb (69.4 kg)  01/24/15 1346 - 98 F (36.7 C) - - - - - -  01/24/15 1345 - - - 82 13 99 % - -  01/24/15 1333 112/64 mmHg - - 83 13 99 % - -  01/24/15 1330 - - - 81 11 98 % - -  01/24/15 1315 - - - 87 17 98 % - -  01/24/15 1303 123/61 mmHg - - 86 18 97 % - -  01/24/15 1300 - - - 82 12 97 % - -  01/24/15 1245 - - - 80 (!) 9 98 % - -  01/24/15 1233 117/60 mmHg - - 79 12 97 % - -  01/24/15 1230 - - - 78 11 97 % - -  01/24/15 1218 118/62 mmHg - - 78 12 97 % - -  01/24/15 1215 - - - 78 10 97 % - -  01/24/15 1203 122/61 mmHg - - 78 12 97 % - -  01/24/15 1200 - 97.7 F (36.5 C) - 77 15 97 % - -  01/24/15 1148 130/64 mmHg - - 80 14 96 % - -  01/24/15 1145 - - - 78 12 96 % - -  01/24/15 1133 125/67 mmHg - - 77 10 94 % - -  01/24/15 1130 - - - 77 11 94 % - -  01/24/15 1118 127/67 mmHg - - 74 10 95 % - -  01/24/15 1115 - - - 73 14 96 % - -  01/24/15 1103 138/69 mmHg - - 74 11 96 % - -  01/24/15 1100 - - - 73 11 96 % - -  01/24/15 1048 139/76 mmHg - - 73 12 95 % - -    01/24/15 1045 - - - 73 14 95 % - -  01/24/15 1033 (!) 144/77 mmHg 97.5 F (36.4 C) - (!) 36 13 93 % - -   Current Weight  01/24/15 153 lb (69.4 kg)     Intake/Output from previous day: 11/10 0701 - 11/11 0700 In: 1823.8 [I.V.:1773.8; IV Piggyback:50] Out: 2480 [Urine:2110; Blood:10; Chest Tube:360]    PHYSICAL EXAM:  Heart:  RRR Lungs: Clear Wound: Clean and dry Chest tube: No air leak    Lab Results: CBC: Recent Labs  01/22/15 1416 01/25/15 0400  WBC 6.8 7.7  HGB 15.2 12.8*  HCT 45.2 39.0  PLT 181 146*   BMET:  Recent Labs  01/22/15 1416 01/25/15 0400  NA 133* 135  K 3.9 3.8  CL 101 102  CO2 21* 26  GLUCOSE 116* 86  BUN 15 6  CREATININE 0.89 0.68  CALCIUM 9.1 7.8*    PT/INR:  Recent Labs  01/22/15 1416  LABPROT 13.8  INR 1.04   CXR:   CLINICAL DATA: Status post right video-assisted thoracic surgery on January 24, 2015 for pulmonary nodules.  EXAM: PORTABLE CHEST 1 VIEW  COMPARISON: Portable chest x-ray of January 24, 2015.  FINDINGS: On the right there remain coarse interstitial densities in the mid and lower lung. No definite pneumothorax is observed today. The chest tube tip projects over the posterior aspect of the third rib. The left lung is mildly hypoinflated. Subsegmental atelectasis at the left lung base is present. There is no significant pleural effusion. The heart and pulmonary vascularity are normal. The right internal jugular venous catheter tip projects over the junction of the proximal and mid SVC.  IMPRESSION: No definite pneumothorax on the right today. The chest tube is in reasonable position. There remain coarse interstitial lung markings in the right mid and lower lung. Developing subsegmental atelectasis at the left lung base is suspected.       Assessment/Plan: S/P Procedure(s) (LRB): RIGHT VIDEO ASSISTED THORACOSCOPY (VATS)/WEDGE RESECTION (Right) CT with no air leak, CXR stable with no ptx.  Will  try CT to water seal. Advance diet, decrease IVF, mobilize, start IS/pulm toilet, routine POD #1 progression.   LOS: 1 day    COLLINS,GINA H 01/25/2015  Patient seen and examined, agree with above  Remo Lipps C. Roxan Hockey, MD [facility] [phone]

## 2015-01-26 ENCOUNTER — Inpatient Hospital Stay (HOSPITAL_COMMUNITY): Payer: Medicare HMO

## 2015-01-26 DIAGNOSIS — E440 Moderate protein-calorie malnutrition: Secondary | ICD-10-CM | POA: Insufficient documentation

## 2015-01-26 LAB — COMPREHENSIVE METABOLIC PANEL
ALT: 11 U/L — AB (ref 17–63)
ANION GAP: 5 (ref 5–15)
AST: 19 U/L (ref 15–41)
Albumin: 3.1 g/dL — ABNORMAL LOW (ref 3.5–5.0)
Alkaline Phosphatase: 58 U/L (ref 38–126)
BUN: 9 mg/dL (ref 6–20)
CHLORIDE: 101 mmol/L (ref 101–111)
CO2: 29 mmol/L (ref 22–32)
CREATININE: 0.8 mg/dL (ref 0.61–1.24)
Calcium: 8.3 mg/dL — ABNORMAL LOW (ref 8.9–10.3)
GFR calc non Af Amer: >60 mL/min (ref >60)
Glucose, Bld: 110 mg/dL — ABNORMAL HIGH (ref 65–99)
POTASSIUM: 3.7 mmol/L (ref 3.5–5.1)
SODIUM: 135 mmol/L (ref 135–145)
Total Bilirubin: 0.2 mg/dL — ABNORMAL LOW (ref 0.3–1.2)
Total Protein: 5.4 g/dL — ABNORMAL LOW (ref 6.5–8.1)

## 2015-01-26 LAB — CBC
HCT: 39.1 % (ref 39.0–52.0)
Hemoglobin: 12.5 g/dL — ABNORMAL LOW (ref 13.0–17.0)
MCH: 31.4 pg (ref 26.0–34.0)
MCHC: 32 g/dL (ref 30.0–36.0)
MCV: 98.2 fL (ref 78.0–100.0)
PLATELETS: 151 10*3/uL (ref 150–400)
RBC: 3.98 MIL/uL — AB (ref 4.22–5.81)
RDW: 13 % (ref 11.5–15.5)
WBC: 7.1 10*3/uL (ref 4.0–10.5)

## 2015-01-26 MED ORDER — GUAIFENESIN ER 600 MG PO TB12
600.0000 mg | ORAL_TABLET | Freq: Two times a day (BID) | ORAL | Status: DC
  Administered 2015-01-26 – 2015-01-27 (×3): 600 mg via ORAL
  Filled 2015-01-26 (×4): qty 1

## 2015-01-26 NOTE — Progress Notes (Addendum)
       [address]       [place] [address]             [phone]          2 Days Post-Op Procedure(s) (LRB): RIGHT VIDEO ASSISTED THORACOSCOPY (VATS)/WEDGE RESECTION (Right)  Subjective: Pt got up by himself this am and inadvertently pulled central line out.  Stable otherwise, pain controlled. Breathing stable although c/o cough with thick mucus he can't cough up.    Objective: Vital signs in last 24 hours: Patient Vitals for the past 24 hrs:  BP Temp Temp src Pulse Resp SpO2  01/26/15 0317 130/70 mmHg 98.1 F (36.7 C) Oral 68 12 91 %  01/26/15 0000 129/71 mmHg 98.3 F (36.8 C) Oral 75 18 94 %  01/25/15 1932 115/71 mmHg 98.3 F (36.8 C) Oral 74 13 95 %  01/25/15 1930 - - - 73 17 96 %  01/25/15 1700 118/60 mmHg 98.1 F (36.7 C) Oral - - -  01/25/15 1243 - - - - 20 92 %  01/25/15 1213 - 98.1 F (36.7 C) Oral - - -  01/25/15 1153 114/64 mmHg - - 65 14 94 %  01/25/15 0828 - - - - 19 92 %  01/25/15 0800 - - - 70 12 92 %   Current Weight  01/24/15 153 lb (69.4 kg)     Intake/Output from previous day: 11/11 0701 - 11/12 0700 In: 2295.4 [P.O.:1440; I.V.:805.4; IV Piggyback:50] Out: 4496 [Urine:3725; Chest Tube:300]    PHYSICAL EXAM:  Heart: RRR Lungs: Coarse BS bilaterally Wound: Clean and dry Chest tube: no air leak with cough    Lab Results: CBC: Recent Labs  01/25/15 0400 01/26/15 0410  WBC 7.7 7.1  HGB 12.8* 12.5*  HCT 39.0 39.1  PLT 146* 151   BMET:  Recent Labs  01/25/15 0400 01/26/15 0410  NA 135 135  K 3.8 3.7  CL 102 101  CO2 26 29  GLUCOSE 86 110*  BUN 6 9  CREATININE 0.68 0.80  CALCIUM 7.8* 8.3*    PT/INR: No results for input(s): LABPROT, INR in the last 72 hours.    Assessment/Plan: S/P Procedure(s) (LRB): RIGHT VIDEO ASSISTED THORACOSCOPY (VATS)/WEDGE RESECTION (Right) CT with no air leak, CXR stable. CT output around 300 ml over past 24 hours, but decreased output overnight. Hopefully can d/c CT  soon. Since central line is out, will d/c PCA and use po pain meds. Pulm- continue IS, will add FV and Mucinex for thick sputum. HTN- BPs generally stable. Continue Norvasc, will resume ARB as needed. Plavix restarted for vascular stent. Continue ambulation.   LOS: 2 days    COLLINS,GINA H 01/26/2015  Patient seen and examined, agree with above Dc chest tube Possibly home tomorrow  [place]. Roxan Hockey, MD [facility] [phone]

## 2015-01-26 NOTE — Progress Notes (Signed)
PT demonstrates verbal and hands on understanding of Flutter device. 

## 2015-01-26 NOTE — Progress Notes (Signed)
01/26/2015  0815  Went into patient's room and patient had got back in the bed by himself.  Went to straighten out lines and and patient had pulled out IJ during transfer from chair to bed.  PA at bedside.  Stated it was okay to leave out IJ and D/C the Fentanyl PCA.  260 mcg wasted in sink with Deanna RN.  [name] wrote orders for the patient's CT to be D/C'd.  Chest tube D/C'd without complications.    Timothy Wood

## 2015-01-27 ENCOUNTER — Inpatient Hospital Stay (HOSPITAL_COMMUNITY): Payer: Medicare HMO

## 2015-01-27 LAB — TISSUE CULTURE
CULTURE: NO GROWTH
GRAM STAIN: NONE SEEN

## 2015-01-27 MED ORDER — OXYCODONE HCL 5 MG PO TABS: 5.0000 mg | ORAL_TABLET | ORAL | Status: DC | PRN

## 2015-01-27 NOTE — Progress Notes (Addendum)
       [address]       [place] [address]             [phone]          3 Days Post-Op Procedure(s) (LRB): RIGHT VIDEO ASSISTED THORACOSCOPY (VATS)/WEDGE RESECTION (Right)  Subjective: Feels well. Breathing stable. Off O2 and ambulating without difficulty.   Objective: Vital signs in last 24 hours: Patient Vitals for the past 24 hrs:  BP Temp Temp src Pulse Resp SpO2  01/27/15 0746 - 98.1 F (36.7 C) Oral - - -  01/27/15 0303 (!) 155/76 mmHg 97.9 F (36.6 C) Oral 65 14 98 %  01/27/15 0013 (!) 145/70 mmHg 98.3 F (36.8 C) Oral 64 16 96 %  01/26/15 1938 122/71 mmHg 97.6 F (36.4 C) Oral 64 15 97 %  01/26/15 1618 - 98.1 F (36.7 C) Oral - - -  01/26/15 1524 138/65 mmHg - - 65 18 95 %  01/26/15 1250 132/75 mmHg 98.1 F (36.7 C) Oral 66 12 98 %   Current Weight  01/24/15 153 lb (69.4 kg)     Intake/Output from previous day: 11/12 0701 - 11/13 0700 In: -  Out: 395 [Urine:325; Chest Tube:70]    PHYSICAL EXAM:  Heart: RRR Lungs: Clear Wound: Clean and dry    Lab Results: CBC: Recent Labs  01/25/15 0400 01/26/15 0410  WBC 7.7 7.1  HGB 12.8* 12.5*  HCT 39.0 39.1  PLT 146* 151   BMET:  Recent Labs  01/25/15 0400 01/26/15 0410  NA 135 135  K 3.8 3.7  CL 102 101  CO2 26 29  GLUCOSE 86 110*  BUN 6 9  CREATININE 0.68 0.80  CALCIUM 7.8* 8.3*    PT/INR: No results for input(s): LABPROT, INR in the last 72 hours.    Assessment/Plan: S/P Procedure(s) (LRB): RIGHT VIDEO ASSISTED THORACOSCOPY (VATS)/WEDGE RESECTION (Right) CXR stable, pt otherwise doing well. Plan d/c home today, instructions reviewed with patient.   LOS: 3 days    COLLINS,GINA H 01/27/2015  Patient seen and examined, agree with above Home today Final path still pending  Remo Lipps C. Roxan Hockey, MD [facility] [phone]

## 2015-01-27 NOTE — Discharge Summary (Signed)
[address]       [place] [address]             [phone]              Discharge Summary  Name: Timothy Wood DOB: 09-21-1940 74 y.o. MRN: [id number]   Admission Date: 01/24/2015 Discharge Date: 01/27/2015    Admitting Diagnosis: Right lung nodules   Discharge Diagnosis:  Adenocarcinoma, right middle lobe Granuloma. right upper lobe Hamartoma, right upper lobe   Past Medical History  Diagnosis Date  . Hypertension   . Tobacco abuse   . Pulmonary nodule 06/2006    9 mm right upper obe pulmonary nodule (negative Pet Scan)  . Personal history of colonic polyps 2008    multiple adenomas 2008-2009 exams with large cecal adenoma  . Hyperlipidemia   . PVD (peripheral vascular disease) ([facility])     left leg s/p stent  . Seborrheic keratosis   . Sebaceous cyst     neck  . Sinusitis   . Blood clotting disorder ([facility])   . Diverticulosis   . GERD (gastroesophageal reflux disease)   . Chronic obstructive pulmonary disease (COPD) ([facility])     "patient not aware"  . Pneumonia     in history  . Depression   . Tremor   . DVT (deep venous thrombosis) ([facility])     Post op  knee surgery  . Arthritis      Procedures: RIGHT VIDEO ASSISTED THORACOSCOPY - 01/24/2015  RIGHT UPPER AND MIDDLE LOBE WEDGE RESECTIONS  PLEURAL BIOPSY    HPI:  The patient is a 74 y.o. male with a past medical history significant for hypertension, hyperlipidemia, tobacco abuse (60+ pack years), peripheral vascular disease, DVT after knee surgery, COPD, GERD, colonic polyps, and multiple pulmonary nodules followed by CT.  He recently had a 6 month followup CT that showed multiple stable granulomas. There was a ground glass opacity in the right upper lobe which was unchanged. However, there is a ground glass opacity in the right middle lobe that has very slowly grown over time. It was first noted back in 2010 and was about 4 mm that time. It has continued to grow and is now  1.7 x 1.0 cm. There is no mediastinal or hilar adenopathy.  He has been feeling poorly recently. He has lost 30 pounds over the past 6 months and 15 pounds over the past 3 months. He chooses to poor appetite and depression since his wife's death from lung cancer in 04/20/2022. He was having dizzy spells in July and August and actually passed out one time. He denies any recent dizzy spells but is daughter so that he has had some. Complains of fatigue and general malaise. He continues to smoke a pack of cigarettes daily. He has no interest in quitting. He denies any chest pain, pressure, or tightness. He has not had any headaches or visual changes.  The patient was referred to [name] for thoracic surgical evaluation.  It was felt that he would benefit from VATS/wedge resection at this time. All risks, benefits and alternatives of surgery were explained in detail, and the patient agreed to proceed.     Hospital Course:  The patient was admitted to [facility] on 01/24/2015. The patient was taken to the operating room and underwent the above procedure.    The postoperative course has been uneventful.  Chest tube was removed on postop day 2 and follow up chest  x-rays have remained stable.  Incisions are healing well.  The patient is ambulating in the hall and tolerating a regular diet.  Intraoperative frozen section revealed low grade adenocarcinoma in situ of the right middle lobe and hamartoma and granuloma of the right upper lobe.  Final pathology is pending at this time.  The patient has overall progressed well postoperatively and is medically stable on today's date for discharge home.     Recent vital signs:  Filed Vitals:   01/27/15 0746  BP:   Pulse:   Temp: 98.1 F (36.7 C)  Resp:     Recent laboratory studies:  CBC:  Recent Labs  01/25/15 0400 01/26/15 0410  WBC 7.7 7.1  HGB 12.8* 12.5*  HCT 39.0 39.1  PLT 146* 151   BMET:   Recent Labs  01/25/15 0400 01/26/15 0410    NA 135 135  K 3.8 3.7  CL 102 101  CO2 26 29  GLUCOSE 86 110*  BUN 6 9  CREATININE 0.68 0.80  CALCIUM 7.8* 8.3*    PT/INR: No results for input(s): LABPROT, INR in the last 72 hours.   Discharge Medications:     Medication List    TAKE these medications        amLODipine 5 MG tablet  Commonly known as:  NORVASC  TAKE 1 TABLET (5 MG TOTAL) BY MOUTH DAILY.     aspirin 325 MG tablet  Take 325 mg by mouth every morning.     citalopram 20 MG tablet  Commonly known as:  CELEXA  Take 1 tablet (20 mg total) by mouth daily.     clopidogrel 75 MG tablet  Commonly known as:  PLAVIX  TAKE 1 TABLET (75 MG TOTAL) BY MOUTH EVERY MORNING.     CRESTOR 40 MG tablet  Generic drug:  rosuvastatin  TAKE 1 TABLET (40 MG TOTAL) BY MOUTH DAILY.     cyclobenzaprine 5 MG tablet  Commonly known as:  FLEXERIL  Take 1 tablet (5 mg total) by mouth 2 (two) times daily as needed for muscle spasms.     irbesartan-hydrochlorothiazide 150-12.5 MG tablet  Commonly known as:  AVALIDE  Take 1 tablet by mouth daily.     omeprazole 20 MG capsule  Commonly known as:  PRILOSEC  TAKE 1 CAPSULE (20 MG TOTAL) BY MOUTH 2 (TWO) TIMES DAILY BEFORE A MEAL.     oxyCODONE 5 MG immediate release tablet  Commonly known as:  Oxy IR/ROXICODONE  Take 1-2 tablets (5-10 mg total) by mouth every 4 (four) hours as needed for severe pain.         Discharge Instructions:  The patient is to refrain from driving, heavy lifting or strenuous activity.  May shower daily and clean incisions with soap and water.  May resume regular diet.   Follow Up:    Follow-up Information    Follow up with [name], MD.   Specialty:  Cardiothoracic Surgery   Why:  Office will contact you with an appointment   Contact information:   [place] [address] [phone]       Follow up with [facility] RN In 1 week.   Why:  For suture removal - office will contact you with an appointment        [place] 01/27/2015, 10:00 AM

## 2015-01-27 NOTE — Progress Notes (Signed)
Discharge Note. Pt was given discharge instructions, follow up information, and Rx for pain medication. Pt verbalized  understanding of discharge education and instructions. Pt denies any pain or questions.  Pt is going to be staying with his children for a few days and is currently waiting on his daughter to pick him up for discharge. Pt will be ready for discharge when his daughter arrives.

## 2015-01-27 NOTE — Progress Notes (Signed)
When patient's daughter arrived reviewed with her about importance to pulmonary toileting and discharge instructions including when to call the MD and signs and symptoms of infection. Patient's daughter receptive and asked appropriate questions.

## 2015-01-28 ENCOUNTER — Encounter (HOSPITAL_COMMUNITY): Payer: Self-pay | Admitting: Thoracic Surgery (Cardiothoracic Vascular Surgery)

## 2015-02-04 ENCOUNTER — Encounter (INDEPENDENT_AMBULATORY_CARE_PROVIDER_SITE_OTHER): Payer: Self-pay

## 2015-02-04 DIAGNOSIS — C3491 Malignant neoplasm of unspecified part of right bronchus or lung: Secondary | ICD-10-CM

## 2015-02-11 ENCOUNTER — Other Ambulatory Visit: Payer: Self-pay | Admitting: Thoracic Surgery (Cardiothoracic Vascular Surgery)

## 2015-02-11 DIAGNOSIS — C3490 Malignant neoplasm of unspecified part of unspecified bronchus or lung: Secondary | ICD-10-CM

## 2015-02-12 ENCOUNTER — Encounter: Payer: Self-pay | Admitting: Thoracic Surgery (Cardiothoracic Vascular Surgery)

## 2015-02-12 ENCOUNTER — Ambulatory Visit
Admission: RE | Admit: 2015-02-12 | Discharge: 2015-02-12 | Disposition: A | Discharge status: Home / Self Care | Payer: Medicare HMO | Source: Ambulatory Visit | Attending: Thoracic Surgery (Cardiothoracic Vascular Surgery) | Admitting: Thoracic Surgery (Cardiothoracic Vascular Surgery)

## 2015-02-12 ENCOUNTER — Ambulatory Visit (INDEPENDENT_AMBULATORY_CARE_PROVIDER_SITE_OTHER): Payer: Self-pay | Admitting: Thoracic Surgery (Cardiothoracic Vascular Surgery)

## 2015-02-12 VITALS — BP 115/70 | HR 90 | Resp 16 | Ht 69.0 in | Wt 153.0 lb

## 2015-02-12 DIAGNOSIS — C3490 Malignant neoplasm of unspecified part of unspecified bronchus or lung: Secondary | ICD-10-CM

## 2015-02-12 DIAGNOSIS — C342 Malignant neoplasm of middle lobe, bronchus or lung: Secondary | ICD-10-CM

## 2015-02-12 DIAGNOSIS — I2584 Coronary atherosclerosis due to calcified coronary lesion: Secondary | ICD-10-CM

## 2015-02-12 DIAGNOSIS — R918 Other nonspecific abnormal finding of lung field: Secondary | ICD-10-CM

## 2015-02-12 DIAGNOSIS — I2510 Atherosclerotic heart disease of native coronary artery without angina pectoris: Secondary | ICD-10-CM

## 2015-02-12 NOTE — Progress Notes (Signed)
[address]       [place] [address]             [phone]       HPI: Timothy Wood returns for a scheduled postoperative follow-up visit.  He is a 74 year old man with a history of tobacco abuse who had a groundglass opacity in his right middle lobe had been followed over time. This had recently grown. I did a wedge resection of the right middle lobe as well as some nodules in the right upper lobe on 01/24/2015.  Right middle lobe nodule was a well-differentiated, minimally invasive adenocarcinoma. The right upper lobe nodules included an area of atypical adenomatous hyperplasia, a benign hamartoma, and to necrotizing granulomas. Fungal stains suggested histoplasmosis in the granulomas.  He did well postoperatively. His course was uncomplicated.  He feels well. He is still actively smoking. He says he has cut down from 10-15 cigarettes daily to 5-10 daily. He continues to deal with anxiety. He is not having much pain and is not using any pain medication. He has been driving without issue.  Past Medical History  Diagnosis Date  . Hypertension   . Tobacco abuse   . Pulmonary nodule 06/2006    9 mm right upper obe pulmonary nodule (negative Pet Scan)  . Personal history of colonic polyps 2008    multiple adenomas 2008-2009 exams with large cecal adenoma  . Hyperlipidemia   . PVD (peripheral vascular disease) ([facility] [facility])     left leg s/p stent  . Seborrheic keratosis   . Sebaceous cyst     neck  . Sinusitis   . Blood clotting disorder ([facility])   . Diverticulosis   . GERD (gastroesophageal reflux disease)   . Chronic obstructive pulmonary disease (COPD) ([place])     "patient not aware"  . Pneumonia     in history  . Depression   . Tremor   . DVT (deep venous thrombosis) ([facility])     Post op  knee surgery  . Arthritis        Current Outpatient Prescriptions  Medication Sig Dispense Refill  . amLODipine (NORVASC) 5 MG tablet TAKE 1 TABLET (5 MG TOTAL) BY MOUTH  DAILY. 90 tablet 1  . aspirin 325 MG tablet Take 325 mg by mouth every morning.    . citalopram (CELEXA) 20 MG tablet Take 1 tablet (20 mg total) by mouth daily. 30 tablet 3  . clopidogrel (PLAVIX) 75 MG tablet TAKE 1 TABLET (75 MG TOTAL) BY MOUTH EVERY MORNING. 90 tablet 1  . CRESTOR 40 MG tablet TAKE 1 TABLET (40 MG TOTAL) BY MOUTH DAILY. 30 tablet 1  . cyclobenzaprine (FLEXERIL) 5 MG tablet Take 1 tablet (5 mg total) by mouth 2 (two) times daily as needed for muscle spasms. 60 tablet 3  . irbesartan-hydrochlorothiazide (AVALIDE) 150-12.5 MG per tablet Take 1 tablet by mouth daily. 90 tablet 1  . omeprazole (PRILOSEC) 20 MG capsule TAKE 1 CAPSULE (20 MG TOTAL) BY MOUTH 2 (TWO) TIMES DAILY BEFORE A MEAL. 180 capsule 1   No current facility-administered medications for this visit.    Physical Exam BP 115/70 mmHg  Pulse 90  Resp 16  Ht '5\' 9"'$  (1.753 m)  Wt 153 lb (69.4 kg)  BMI 22.58 kg/m2  SpO2 18% 74 year old man in no acute distress Anxious Neuro positive resting tremor, no focal motor deficits Lungs clear with equal breath sounds bilaterally Incisions well healed Cardiac regular rate and rhythm normal  S1 and S2 No peripheral edema  Diagnostic Tests: PATHOLOGYINAL DIAGNOSIS Pathology  Diagnosis 1. Lung, wedge biopsy/resection, right wedge resection, right middle lobe - INVASIVE WELL-DIFFERENTIATED MINIMALLY INVASIVE ADENOCARCINOMA. - TUMOR SPANS 1.1 CM IN GREATEST DIMENSION. - TUMOR APPROACHES BUT DOES NOT INVOLVE THE PLEURA. - MARGIN(S) ARE NEGATIVE FOR ATYPIA OR TUMOR. - SEE ONCOLOGY TEMPLATE. 2. Lung, wedge biopsy/resection, right upper lobe wedge - ATYPICAL ADENOMATOUS HYPERPLASIA, 0.4 CM FOCUS. - BENIGN HAMARTOMA, 0.6 CM IN GREATEST DIMENSION. - TWO ADDITIONAL NODULES SHOWING NECROTIZING GRANULOMATOUS INFLAMMATION WITH SPECIAL STAINS DEMONSTRATING FUNGAL ORGANISMS. - MARGIN(S) ARE NEGATIVE FOR ATYPIA OR TUMOR. - SEE COMMENT. 3. Plaque, right pleural - BENIGN  PLEURAL PLAQUE. - NO ATYPIA OR EVIDENCE OF MALIGNANCY.  I reviewed his chest x-ray. Postoperative changes are present. There is no significant effusion or infiltrate.  Impression: 74 year old man with a stage IA well-differentiated minimally invasive adenocarcinoma of the right middle lobe. His prognosis with this is excellent. I am going to refer him to our multidisciplinary thoracic oncology clinic see Dr. Julien Nordmann for follow-up.  He also had multiple nodules in the right upper lobe. Included in this were two necrotizing granulomas with fungal organisms consistent with histoplasmosis. I am going to refer him to infectious disease to see if they feel that any treatment is necessary.  From a surgical standpoint he is doing well. His activities at this point are unrestricted. He was cautioned to build into new activities gradually. He already has been driving well before he was released to do so. I cautioned him to avoid high speeds and long trips for the next month or so.  I congratulated him on decreasing his tobacco intake, but emphasized the importance of complete tobacco cessation. I advised him to select a definite quit date and will be happy to provide him with medications to assist with that if he feels it is necessary.  Plan: ID consultation Referral to [facility] I will see him back in 5 months with a PA and lateral chest x-ray to check on his progress  [name], MD [facility] [phone]

## 2015-02-14 ENCOUNTER — Telehealth: Payer: Self-pay | Admitting: *Deleted

## 2015-02-14 DIAGNOSIS — C3491 Malignant neoplasm of unspecified part of right bronchus or lung: Secondary | ICD-10-CM

## 2015-02-14 NOTE — Telephone Encounter (Signed)
Oncology Nurse Navigator Documentation  Oncology Nurse Navigator Flowsheets 02/14/2015  Referral date to RadOnc/MedOnc 02/14/2015  Navigator Encounter Type Telephone/I received a referral on Mr. Murrillo.  I called to schedule him for thoracic clinic on 02/28/15 arrive at 3:30.  He verbalized understanding of appt time and place.   Patient Visit Type Initial  Treatment Phase Other  Interventions Coordination of Care  Coordination of Care MD Appointments  Time Spent with Patient 15

## 2015-02-20 ENCOUNTER — Encounter: Payer: Self-pay | Admitting: Internal Medicine

## 2015-02-20 LAB — FUNGUS CULTURE W SMEAR: Fungal Smear: NONE SEEN

## 2015-02-27 ENCOUNTER — Telehealth: Payer: Self-pay | Admitting: *Deleted

## 2015-02-27 NOTE — Telephone Encounter (Signed)
Called pt and  Confirmed 02/28/15 clinic appt w/ him.

## 2015-02-28 ENCOUNTER — Encounter: Payer: Self-pay | Admitting: Internal Medicine

## 2015-02-28 ENCOUNTER — Other Ambulatory Visit (HOSPITAL_BASED_OUTPATIENT_CLINIC_OR_DEPARTMENT_OTHER): Payer: Medicare HMO

## 2015-02-28 ENCOUNTER — Encounter: Payer: Self-pay | Admitting: *Deleted

## 2015-02-28 ENCOUNTER — Ambulatory Visit: Payer: Medicare HMO | Attending: Internal Medicine | Admitting: Physical Therapy

## 2015-02-28 ENCOUNTER — Ambulatory Visit (HOSPITAL_BASED_OUTPATIENT_CLINIC_OR_DEPARTMENT_OTHER): Payer: Medicare HMO | Admitting: Internal Medicine

## 2015-02-28 ENCOUNTER — Telehealth: Payer: Self-pay | Admitting: Internal Medicine

## 2015-02-28 VITALS — BP 129/74 | HR 96 | Temp 98.2°F | Resp 18 | Ht 69.0 in | Wt 150.3 lb

## 2015-02-28 DIAGNOSIS — R5381 Other malaise: Secondary | ICD-10-CM | POA: Insufficient documentation

## 2015-02-28 DIAGNOSIS — C342 Malignant neoplasm of middle lobe, bronchus or lung: Secondary | ICD-10-CM | POA: Diagnosis not present

## 2015-02-28 DIAGNOSIS — C3491 Malignant neoplasm of unspecified part of right bronchus or lung: Secondary | ICD-10-CM

## 2015-02-28 DIAGNOSIS — Z720 Tobacco use: Secondary | ICD-10-CM | POA: Diagnosis not present

## 2015-02-28 DIAGNOSIS — M545 Low back pain, unspecified: Secondary | ICD-10-CM

## 2015-02-28 LAB — CBC WITH DIFFERENTIAL/PLATELET
BASO%: 0.2 % (ref 0.0–2.0)
BASOS ABS: 0 10*3/uL (ref 0.0–0.1)
EOS ABS: 0.1 10*3/uL (ref 0.0–0.5)
EOS%: 1.1 % (ref 0.0–7.0)
HCT: 44.6 % (ref 38.4–49.9)
HGB: 14.9 g/dL (ref 13.0–17.1)
LYMPH%: 22.4 % (ref 14.0–49.0)
MCH: 32 pg (ref 27.2–33.4)
MCHC: 33.4 g/dL (ref 32.0–36.0)
MCV: 95.9 fL (ref 79.3–98.0)
MONO#: 0.5 10*3/uL (ref 0.1–0.9)
MONO%: 10 % (ref 0.0–14.0)
NEUT#: 3.1 10*3/uL (ref 1.5–6.5)
NEUT%: 66.3 % (ref 39.0–75.0)
PLATELETS: 183 10*3/uL (ref 140–400)
RBC: 4.65 10*6/uL (ref 4.20–5.82)
RDW: 13.3 % (ref 11.0–14.6)
WBC: 4.7 10*3/uL (ref 4.0–10.3)
lymph#: 1.1 10*3/uL (ref 0.9–3.3)

## 2015-02-28 LAB — LACTATE DEHYDROGENASE: LDH: 150 U/L (ref 125–245)

## 2015-02-28 NOTE — Progress Notes (Signed)
[facility] Clinical Social Work  Clinical Social Work met with patientat [facility] [place] appointment to offer support and assess for psychosocial needs.  Mr. Adduci indicated he will "be watched" by Dr. Julien Nordmann with scans and there is no need for treatment at this time.  CSW familiar with patient, CSW worked with patient's son and daughter at cancer center.  Patient reported he does experience grief and may feel depressed at times after losing his son and spouse to cancer.  He reported that his house is a "mess" and he often feels lethargic.  CSW validated patients feeling and encouraged patient to seek grief counseling support.  CSW suggested free individual or group counseling through Hospice, counseling in the community, or free counseling offered at the cancer center with counseling interns.  The patient was most interested in group counseling at Hospice.  Patient plans to contact CSW to schedule appointment to complete healthcare advance directives- CSW will provide Hospice information at that The patient scored a 5 on the Psychosocial Distress Thermometer which indicates moderate distress.   ONCBCN DISTRESS SCREENING 02/28/2015  Screening Type Initial Screening  Distress experienced in past week (1-10) 5  Emotional problem type Depression;Nervousness/Anxiety  Referral to clinical social work Yes     Clinical Social Work briefly discussed Clinical Social Work role and [employer] support programs/services.  Clinical Social Work encouraged patient to call with any additional questions or concerns.   Polo Riley, MSW, LCSW, OSW-C Clinical Social Worker [facility] [phone]

## 2015-02-28 NOTE — Progress Notes (Signed)
[place] [phone]   [phone] Multidisciplinary thoracic oncology clinic  CONSULT NOTE  REFERRING PHYSICIAN: Dr. Modesto Charon  REASON FOR CONSULTATION:  74 years old white male recently diagnosed with lung cancer  HPI Timothy Wood is a 74 y.o. male with a long history of smoking and very well-known to [place] since we took care of his wife and son who died recently from lung cancer. The patient has past medical history significant for hypertension, dyslipidemia, peripheral vascular disease, chronic back pain, COPD and coronary artery disease. The patient has a known groundglass opacity in the right lung since 2010. It was monitored closely. CT scan of the chest without contrast on 07/04/2014 showed part solid nodule within the right middle lobe increased in size from the previous exam and measured 1.7 cm suspicious for adenocarcinoma. A PET scan was performed on 07/11/2014 and it showed no hypermetabolism to correspond to the right middle lobe Solid pulmonary nodule. But low-grade adenocarcinoma was a stellate concern. Repeat CT scan of the chest was performed on 12/28/2014 and showed slow but convincing gross of the ground glass nodule in the right middle lobe over the past 6 years concerning for low-grade adenocarcinoma. The patient was referred to [name] and on 01/24/2015 he underwent a right VATS with wedge resection of the right middle lobe as well as wedge resection of the right upper lobe and pleural biopsy.  The final pathology (Accession: [id number]) of the wedge resection of the right middle lobe showed invasive well-differentiated adenocarcinoma spanning 1.1 cm in greatest dimension. The tumor approaches but did not involve the pleura and the resection margins were negative. The patient resection of the right upper lobe showed atypical adenomatous hyperplasia measuring 0.4 cm and there was also a benign hamartoma measuring 0.6 cm in  greatest dimension. There was also 2 additional nodules showing necrotizing granulomatous inflammation with special stains demonstrating fungal organisms.  The patient is recovering well from her surgery and [name] kindly referred him to me today for evaluation and discussion of any adjuvant therapy if needed. When seen today the patient is feeling fine with no specific complaints except for mild soreness at the right side of the chest after the surgical resection. He denied having any significant shortness of breath, cough or hemoptysis. He lost around 35 pounds in the distal his wife few months ago. No headache or visual changes. Family history significant for a mother who had several cancer including uterine cancer, colon cancer and lung cancer. Sister had lung cancer. The patient is a widow and has one daughter Sharyn Lull. He lost his son Legrand Como and his wife Geni Bers to lung cancer. He used to work as a Curator. He has a history of smoking 2 pack per day for around 60 years and unfortunately he continues to smoke. He drinks mild amount of alcohol and no history of drug abuse.   HPI  Past Medical History  Diagnosis Date  . Hypertension   . Tobacco abuse   . Pulmonary nodule 06/2006    9 mm right upper obe pulmonary nodule (negative Pet Scan)  . Personal history of colonic polyps 2008    multiple adenomas 2008-2009 exams with large cecal adenoma  . Hyperlipidemia   . PVD (peripheral vascular disease) ([place])     left leg s/p stent  . Seborrheic keratosis   . Sebaceous cyst     neck  . Sinusitis   . Blood clotting disorder ([place])   .  Diverticulosis   . GERD (gastroesophageal reflux disease)   . Chronic obstructive pulmonary disease (COPD) ([place])     "patient not aware"  . Pneumonia     in history  . Depression   . Tremor   . DVT (deep venous thrombosis) ([place])     Post op  knee surgery  . Arthritis     Past Surgical History  Procedure Laterality Date  . Back surgery   1975, 1978    x2 lumbar fusion over 30 years ago  . Knee surgery  2002    left- cartilage   . Tonsillectomy  1956  . Angioplasty / stenting iliac  08/2008    Dr. Trula Slade  . Descending aortic aneurysm repair w/ stent  04/2010    Dr. Trula Slade  . Colonoscopy  05/11/2006; 07/27/2006;05/05/2007, 11/24/2010    adenomas with 9 polyps index and 2.5 cm cecal polyp removed over 3 exams, last 2009 with APC. diverticulosis also.  2012: 12 adenomas removed, largest 8-10 mm, diverticulosis  . Esophagogastroduodenoscopy  05/05/2007    GERD - one erosion  . Percutaneous pinning Left 06/24/2012    Procedure: Repair Complex Laceration Left Thumb with percutaneous pinning.;  Surgeon: Dennie Bible, MD;  Location: [facility];  Service: Plastics;  Laterality: Left;  . Thumb amputation Left     pinning and index finger  . Eye surgery Bilateral     cataract  . Video assisted thoracoscopy (vats)/wedge resection Right 01/24/2015    Procedure: RIGHT VIDEO ASSISTED THORACOSCOPY (VATS)/WEDGE RESECTION;  Surgeon: [name], MD;  Location: [facility];  Service: Thoracic;  Laterality: Right;    Family History  Problem Relation Age of Onset  . Colon cancer Mother 1  . Deep vein thrombosis Mother   . Heart disease Brother   . Diabetes    . Lung cancer Son     smoked and was a Building control surveyor  . Heart disease Brother   . Rheum arthritis Daughter   . Rheum arthritis Sister     Social History Social History  Substance Use Topics  . Smoking status: Current Every Day Smoker -- 0.75 packs/day for 60 years    Types: Cigarettes  . Smokeless tobacco: Never Used  . Alcohol Use: 0.0 oz/week    0 Standard drinks or equivalent per week     Comment: occasional    Allergies  Allergen Reactions  . Hydrocodone Itching    Current Outpatient Prescriptions  Medication Sig Dispense Refill  . amLODipine (NORVASC) 5 MG tablet TAKE 1 TABLET (5 MG TOTAL) BY MOUTH DAILY. 90 tablet 1  . aspirin 325 MG tablet Take 325 mg by mouth every  morning.    . citalopram (CELEXA) 20 MG tablet Take 1 tablet (20 mg total) by mouth daily. 30 tablet 3  . clopidogrel (PLAVIX) 75 MG tablet TAKE 1 TABLET (75 MG TOTAL) BY MOUTH EVERY MORNING. 90 tablet 1  . CRESTOR 40 MG tablet TAKE 1 TABLET (40 MG TOTAL) BY MOUTH DAILY. 30 tablet 1  . cyclobenzaprine (FLEXERIL) 5 MG tablet Take 1 tablet (5 mg total) by mouth 2 (two) times daily as needed for muscle spasms. 60 tablet 3  . irbesartan-hydrochlorothiazide (AVALIDE) 150-12.5 MG per tablet Take 1 tablet by mouth daily. 90 tablet 1  . omeprazole (PRILOSEC) 20 MG capsule TAKE 1 CAPSULE (20 MG TOTAL) BY MOUTH 2 (TWO) TIMES DAILY BEFORE A MEAL. 180 capsule 1   No current facility-administered medications for this visit.    Review of Systems  Constitutional: positive for weight loss Eyes: negative Ears, nose, mouth, throat, and face: negative Respiratory: negative Cardiovascular: negative Gastrointestinal: negative Genitourinary:negative Integument/breast: negative Hematologic/lymphatic: negative Musculoskeletal:negative Neurological: negative Behavioral/Psych: negative Endocrine: negative Allergic/Immunologic: negative  Physical Exam  GLO:VFIEP, healthy, no distress, well nourished and well developed SKIN: skin color, texture, turgor are normal, no rashes or significant lesions HEAD: Normocephalic, No masses, lesions, tenderness or abnormalities EYES: normal, PERRLA, Conjunctiva are pink and non-injected EARS: External ears normal, Canals clear OROPHARYNX:no exudate, no erythema and lips, buccal mucosa, and tongue normal  NECK: supple, no adenopathy, no JVD LYMPH:  no palpable lymphadenopathy, no hepatosplenomegaly LUNGS: clear to auscultation , and palpation HEART: regular rate & rhythm, no murmurs and no gallops ABDOMEN:abdomen soft, non-tender, normal bowel sounds and no masses or organomegaly BACK: Back symmetric, no curvature., No CVA tenderness EXTREMITIES:no joint deformities,  effusion, or inflammation, no edema, no skin discoloration  NEURO: alert & oriented x 3 with fluent speech, no focal motor/sensory deficits  PERFORMANCE STATUS: ECOG 1  LABORATORY DATA: Lab Results  Component Value Date   WBC 4.7 02/28/2015   HGB 14.9 02/28/2015   HCT 44.6 02/28/2015   MCV 95.9 02/28/2015   PLT 183 02/28/2015      Chemistry      Component Value Date/Time   NA 135 01/26/2015 0410   K 3.7 01/26/2015 0410   CL 101 01/26/2015 0410   CO2 29 01/26/2015 0410   BUN 9 01/26/2015 0410   CREATININE 0.80 01/26/2015 0410      Component Value Date/Time   CALCIUM 8.3* 01/26/2015 0410   ALKPHOS 58 01/26/2015 0410   AST 19 01/26/2015 0410   ALT 11* 01/26/2015 0410   BILITOT 0.2* 01/26/2015 0410       RADIOGRAPHIC STUDIES: Dg Chest 2 View  02/12/2015  CLINICAL DATA:  Right upper lobe lung malignancy status post VATSS on January 24, 2015. Follow-up study, currently asymptomatic. EXAM: CHEST  2 VIEW COMPARISON:  PA and lateral chest x-ray of January 27, 2015 FINDINGS: The lungs are well-expanded with mild hemidiaphragm flattening. The interstitial markings have further improved. There remains linear scarring in the right mid lung. There is no pulmonary parenchymal mass nor pleural effusion. A nipple shadow on the left is stable. The heart and pulmonary vascularity are normal. The bony thorax exhibits no acute abnormality. IMPRESSION: COPD. Postsurgical change on the right. No pulmonary parenchymal mass, infiltrate, or residual pneumothorax is observed today. The small right pleural effusion has resolved. Electronically Signed   By: David  [name] M.D.   On: 02/12/2015 09:14    ASSESSMENT: This is a very pleasant 74 years old white male recently diagnosed with a stage IA (T1a, N0, M0) non-small cell lung cancer, adenocarcinoma status post wedge resection of the right middle lobe in November 2016.   PLAN: I had a lengthy discussion with the patient today about his current  disease stage, prognosis and treatment options. I explained to the patient that the 5 year survival for patient with a stage IA is around 80%. There is no survival benefit for adjuvant systemic chemotherapy for patient with a stage IA. I recommended for the patient to continue on observation and close monitoring. I would see him back for follow-up visit in 6 months with repeat CT scan of the chest for restaging of his disease. For smoke cessation, I strongly encouraged the patient to quit smoking and offered him a smoke cessation program. The patient was seen during the multidisciplinary thoracic oncology clinic today by  medical oncology, thoracic navigator, social worker and physical therapist. He was advised to call immediately if he has any concerning symptoms in the interval. The patient voices understanding of current disease status and treatment options and is in agreement with the current care plan.  All questions were answered. The patient knows to call the clinic with any problems, questions or concerns. We can certainly see the patient much sooner if necessary.  Thank you so much for allowing me to participate in the care of WINTER TREFZ. I will continue to follow up the patient with you and assist in his care.  I spent 40 minutes counseling the patient face to face. The total time spent in the appointment was 60 minutes.  Disclaimer: This note was dictated with voice recognition software. Similar sounding words can inadvertently be transcribed and may not be corrected upon review.   Chalese Peach K. February 28, 2015, 4:37 PM

## 2015-02-28 NOTE — Therapy (Signed)
[place], [place], [address] Phone: [phone]   Fax:  [phone]  Physical Therapy Evaluation  Patient Details  Name: Timothy Wood MRN: [id number] Date of Birth: 23-May-1940 Referring Provider: Dr. Curt Bears  Encounter Date: 02/28/2015      PT End of Session - 02/28/15 1814    Visit Number 1   Number of Visits 1   PT Start Time 6761   PT Stop Time 1652   PT Time Calculation (min) 16 min   Activity Tolerance Patient tolerated treatment well   Behavior During Therapy [facility] for tasks assessed/performed      Past Medical History  Diagnosis Date  . Hypertension   . Tobacco abuse   . Pulmonary nodule 06/2006    9 mm right upper obe pulmonary nodule (negative Pet Scan)  . Personal history of colonic polyps 2008    multiple adenomas 2008-2009 exams with large cecal adenoma  . Hyperlipidemia   . PVD (peripheral vascular disease) ([place])     left leg s/p stent  . Seborrheic keratosis   . Sebaceous cyst     neck  . Sinusitis   . Blood clotting disorder ([place])   . Diverticulosis   . GERD (gastroesophageal reflux disease)   . Chronic obstructive pulmonary disease (COPD) ([place])     "patient not aware"  . Pneumonia     in history  . Depression   . Tremor   . DVT (deep venous thrombosis) ([place])     Post op  knee surgery  . Arthritis     Past Surgical History  Procedure Laterality Date  . Back surgery  1975, 1978    x2 lumbar fusion over 30 years ago  . Knee surgery  2002    left- cartilage   . Tonsillectomy  1956  . Angioplasty / stenting iliac  08/2008    Dr. Trula Slade  . Descending aortic aneurysm repair w/ stent  04/2010    Dr. Trula Slade  . Colonoscopy  05/11/2006; 07/27/2006;05/05/2007, 11/24/2010    adenomas with 9 polyps index and 2.5 cm cecal polyp removed over 3 exams, last 2009 with APC. diverticulosis also.  2012: 12 adenomas removed, largest 8-10 mm, diverticulosis  .  Esophagogastroduodenoscopy  05/05/2007    GERD - one erosion  . Percutaneous pinning Left 06/24/2012    Procedure: Repair Complex Laceration Left Thumb with percutaneous pinning.;  Surgeon: Dennie Bible, MD;  Location: [facility];  Service: Plastics;  Laterality: Left;  . Thumb amputation Left     pinning and index finger  . Eye surgery Bilateral     cataract  . Video assisted thoracoscopy (vats)/wedge resection Right 01/24/2015    Procedure: RIGHT VIDEO ASSISTED THORACOSCOPY (VATS)/WEDGE RESECTION;  Surgeon: [name], MD;  Location: [facility];  Service: Thoracic;  Laterality: Right;    There were no vitals filed for this visit.  Visit Diagnosis:  Midline low back pain without sciatica - Plan: PT plan of care cert/re-cert  Physical deconditioning - Plan: PT plan of care cert/re-cert      Subjective Assessment - 02/28/15 1803    Subjective has low back pain that he describes as "annoying"   Pertinent History Patient with a strong family history of lung cancer was followed with CT scans due to a right lung opacity.  Had VATS wedge resection 01/24/15 for right middle lobe invasive adenocarcinoma, stage IA.  He is expected to be followed in survivorship program.  Current smoker.  h/o 2 back surgeries.   Patient Stated Goals get information from all lung clinic providers   Currently in Pain? Yes   Pain Score 3    Pain Location Back   Pain Orientation Lower   Pain Descriptors / Indicators Other (Comment)  "annoying"   Pain Type Chronic pain   Aggravating Factors  unsure   Pain Relieving Factors Flexeril, aspirin            [facility] PT Assessment - 02/28/15 0001    Assessment   Medical Diagnosis right middle lobe invasive adenocarcinoma, stage IA   Referring Provider Dr. Curt Bears   Onset Date/Surgical Date 12/28/14   Precautions   Precautions Other (comment)   Precaution Comments cancer precautions   Restrictions   Weight Bearing Restrictions No   Balance Screen    Has the patient fallen in the past 6 months No   Has the patient had a decrease in activity level because of a fear of falling?  No   Is the patient reluctant to leave their home because of a fear of falling?  No   Home Environment   Living Environment Private residence   Living Arrangements Alone   Type of [place] One level   Prior Function   Level of Independence Independent   Leisure walks his dog 2xc/day x approx. 1/2 mile (20 minutes)   Cognition   Overall Cognitive Status Within Functional Limits for tasks assessed   Observation/Other Assessments   Observations thin, well-looking man who moves easily   Functional Tests   Functional tests Sit to Stand   Sit to Stand   Comments 9 times in 30 seconds   below average   Posture/Postural Control   Posture/Postural Control Postural limitations   Postural Limitations Forward head   ROM / Strength   AROM / PROM / Strength AROM   AROM   Overall AROM Comments In standing, active trunk ROM:  in flexion, reaches fingertips 18 inches to floor; extension 25% loss; sidebend and rotation bilat. [facility]   Ambulation/Gait   Ambulation/Gait Yes   Ambulation/Gait Assistance 7: Independent   Balance   Balance Assessed Yes   Dynamic Standing Balance   Dynamic Standing - Comments reaches 15 inches forward in standing, above average for his age                           PT Education - 02/28/15 1813    Education provided Yes   Education Details walking, staying active article, posture, breathing, PT info; advised him to increase time and speed of walking   Person(s) Educated Patient   Methods Explanation;Handout   Comprehension Verbalized understanding               Lung Clinic Goals - 02/28/15 1819    Patient will be able to verbalize understanding of the benefit of exercise to decrease fatigue.   Status Achieved   Patient will be able to verbalize the importance of posture.   Status Achieved    Patient will be able to demonstrate diaphragmatic breathing for improved lung function.   Status Achieved   Patient will be able to verbalize understanding of the role of physical therapy to prevent functional decline and who to contact if physical therapy is needed.   Status Achieved             Plan - 02/28/15 1814    Clinical Impression Statement 73  y.o. gentleman with recent VATS wedge resection of tumor in right middlelobe.  Cancer staged at IA and patient will be followed in survivorship clinic.  Doing well currently, but was advised to increase his exercise regimen.   Pt will benefit from skilled therapeutic intervention in order to improve on the following deficits Cardiopulmonary status limiting activity   Rehab Potential Good   PT Frequency One time visit   PT Treatment/Interventions Patient/family education   PT Next Visit Plan None at this time.   Consulted and Agree with Plan of Care Patient          G-Codes - 03-16-15 1819    Functional Assessment Tool Used clinical judgement   Functional Limitation Changing and maintaining body position   Changing and Maintaining Body Position Current Status [phone]) At least 1 percent but less than 20 percent impaired, limited or restricted   Changing and Maintaining Body Position Goal Status (I0973) At least 1 percent but less than 20 percent impaired, limited or restricted   Changing and Maintaining Body Position Discharge Status (Z3299) At least 1 percent but less than 20 percent impaired, limited or restricted       Problem List Patient Active Problem List   Diagnosis Date Noted  . Malnutrition of moderate degree 01/26/2015  . Adenocarcinoma of right lung ([place]) 01/24/2015  . Coronary atherosclerosis due to calcified coronary lesion of native artery 01/14/2015  . Tremor 09/19/2014  . Infected sebaceous cyst of back 07/21/2013  . Carbuncle and furuncle of trunk 07/10/2013  . COPD GOLD I 03/03/2013  . Grieving 02/06/2013   . Abnormal prostate on physical examination 08/01/2012  . Personal history of adenomatous colonic polyps 09/12/2010  . Peripheral vascular disease ([place]) 06/11/2008  . Hyperlipidemia 03/02/2008  . SEBORRHEIC KERATOSIS 03/02/2008  . BACK PAIN, LUMBAR, CHRONIC 09/30/2007  . TOBACCO ABUSE 08/19/2007  . Essential hypertension 08/19/2007  . Multiple pulmonary nodules 08/19/2007    SALISBURY,DONNA 16-Mar-2015, 6:22 PM  [place], [place], [address] Phone: [phone]   Fax:  [phone]  Name: MASON DIBIASIO MRN: [id number] Date of Birth: 1940/04/13   Serafina Royals, PT 03-16-15 6:22 PM

## 2015-02-28 NOTE — Telephone Encounter (Signed)
per [facility] to [facility] pt appt-pt stated will be in [place] wanted to sch in July-gave # to [facility] to call & get CT sch

## 2015-03-08 LAB — AFB CULTURE WITH SMEAR (NOT AT ARMC): ACID FAST SMEAR: NONE SEEN

## 2015-03-26 ENCOUNTER — Telehealth: Payer: Self-pay | Admitting: *Deleted

## 2015-03-26 NOTE — Telephone Encounter (Signed)
Oncology Nurse Navigator Documentation  Oncology Nurse Navigator Flowsheets 03/26/2015  Navigator Location [facility]  Navigator Encounter Type Telephone/called patient to follow up from thoracic clinic.  No barriers identified   Telephone Outgoing Call  Abnormal Finding Date 08/03/2014  Confirmed Diagnosis Date 01/24/2015  Surgery Date 01/24/2015  Treatment Initiated Date 01/24/2015  Patient Visit Type Follow-up  Treatment Phase Other  Barriers/Navigation Needs No barriers at this time  Acuity Level 1  Time Spent with Patient 15

## 2015-04-01 ENCOUNTER — Encounter: Payer: Self-pay | Admitting: Infectious Diseases

## 2015-04-01 ENCOUNTER — Ambulatory Visit (INDEPENDENT_AMBULATORY_CARE_PROVIDER_SITE_OTHER): Payer: Medicare HMO | Admitting: Infectious Diseases

## 2015-04-01 VITALS — BP 120/69 | HR 92 | Temp 97.7°F | Ht 68.0 in | Wt 145.0 lb

## 2015-04-01 DIAGNOSIS — C3491 Malignant neoplasm of unspecified part of right bronchus or lung: Secondary | ICD-10-CM | POA: Diagnosis not present

## 2015-04-01 DIAGNOSIS — B392 Pulmonary histoplasmosis capsulati, unspecified: Secondary | ICD-10-CM | POA: Insufficient documentation

## 2015-04-01 DIAGNOSIS — F17200 Nicotine dependence, unspecified, uncomplicated: Secondary | ICD-10-CM | POA: Diagnosis not present

## 2015-04-01 DIAGNOSIS — R69 Illness, unspecified: Secondary | ICD-10-CM | POA: Diagnosis not present

## 2015-04-01 DIAGNOSIS — B399 Histoplasmosis, unspecified: Secondary | ICD-10-CM

## 2015-04-01 HISTORY — DX: Pulmonary histoplasmosis capsulati, unspecified: B39.2

## 2015-04-01 LAB — COMPREHENSIVE METABOLIC PANEL
ALBUMIN: 4.2 g/dL (ref 3.6–5.1)
ALK PHOS: 61 U/L (ref 40–115)
ALT: 10 U/L (ref 9–46)
AST: 13 U/L (ref 10–35)
BILIRUBIN TOTAL: 0.3 mg/dL (ref 0.2–1.2)
BUN: 17 mg/dL (ref 7–25)
CALCIUM: 8.9 mg/dL (ref 8.6–10.3)
CO2: 26 mmol/L (ref 20–31)
CREATININE: 1.04 mg/dL (ref 0.70–1.18)
Chloride: 105 mmol/L (ref 98–110)
Glucose, Bld: 68 mg/dL (ref 65–99)
Potassium: 4 mmol/L (ref 3.5–5.3)
SODIUM: 140 mmol/L (ref 135–146)
TOTAL PROTEIN: 6.5 g/dL (ref 6.1–8.1)

## 2015-04-01 MED ORDER — ITRACONAZOLE 10 MG/ML PO SOLN: 200.0000 mg | Freq: Two times a day (BID) | ORAL | Status: DC

## 2015-04-01 NOTE — Assessment & Plan Note (Signed)
He appears to be doing well I counseled him that he is still at high risk for lung CA and that he may have recurrence.

## 2015-04-01 NOTE — Progress Notes (Signed)
   Subjective:    Patient ID: Timothy Wood, male    DOB: [birth year], 75 y.o.   MRN: [id number]  HPI 75 yo M with hx of RUL ground glass opacity, then nodules in RUL, RML. He underwent VATS, wedge resection of these 01-2015.  He was found to have " Right middle lobe nodule was a well-differentiated, minimally invasive adenocarcinoma. The right upper lobe nodules included an area of atypical adenomatous hyperplasia, a benign hamartoma, and to necrotizing granulomas. Fungal stains suggested histoplasmosis in the granulomas." His margins and pleura were negative.  He continues to smoke.  He has been eval by Onc and staged as 1A. No CTX. Has lived in [place], [place], [place], [place] in the past.  Has lived in [place] 59 yrs.  No fever or chills, no cough. No sob. His vision "fades in and out". Has had cataract surgery in last year.   Has tried patches "years and years ago, gave me nightmares".  PMHx, Soc, Fhx reviewed, updated in EPIC.   Review of Systems  Constitutional: Negative for fever, chills, appetite change and unexpected weight change.  Respiratory: Negative for cough and shortness of breath.   Gastrointestinal: Negative for diarrhea and constipation.  Genitourinary: Negative for difficulty urinating.  Hematological: Negative for adenopathy.  lost 35# after his wife and his son died.      Objective:   Physical Exam  Constitutional: He appears well-developed and well-nourished.  HENT:  Mouth/Throat: Oropharynx is clear and moist.  Eyes: EOM are normal. Pupils are equal, round, and reactive to light.  Neck: Neck supple.  Cardiovascular: Normal rate, regular rhythm and normal heart sounds.   Pulmonary/Chest: Effort normal and breath sounds normal.  Abdominal: Soft. Bowel sounds are normal. There is no tenderness. There is no rebound.  Musculoskeletal: Normal range of motion.  Lymphadenopathy:       Head (right side): No submental adenopathy present.       Head (left side): No submental  adenopathy present.    He has no cervical adenopathy.    He has no axillary adenopathy.       Assessment & Plan:

## 2015-04-01 NOTE — Assessment & Plan Note (Addendum)
Will start him on itra '200mg'$  tid for 3 days then bid for a total of 6 weeks.  Treatment for him is unclear- he would be considered asx and his findings could be considered incidental. Even, that his disease has been resected.  Alternatively, I would favor treating him as i believe that it will help his lung CA f/u (less likely to be progression of histo), and potential future treatment of lung CA (need for CTX and possible histo reactivation). I discussed this at length with the pt and his daughter.  I warned him regarding possible color changes with Itra as well as possible elevation of LFTs.  His last LFTs were normal, will recheck today as well as a urine histo Ag.  Will print his rx so he can price check it. Also , will make our drug assitance personal available to him.  Will see him back in 1 month

## 2015-04-01 NOTE — Assessment & Plan Note (Signed)
Would consider po anti-smoking rx.  Will defer to his PCP

## 2015-04-05 ENCOUNTER — Telehealth: Payer: Self-pay | Admitting: Infectious Diseases

## 2015-04-05 ENCOUNTER — Telehealth: Payer: Self-pay | Admitting: *Deleted

## 2015-04-05 LAB — HISTOPLASMA ANTIGEN, URINE: Histoplasma Antigen, urine: <0.5 ng/mL

## 2015-04-05 NOTE — Telephone Encounter (Signed)
Called insurance to start prior auth for itraconazole [phone]  This was approved- PA# [id number]

## 2015-04-05 NOTE — Telephone Encounter (Signed)
thanks

## 2015-04-05 NOTE — Telephone Encounter (Signed)
[facility] will supply this medication for the patient. Benjamine Mola, pharmacist will call patient to inform him and he can pick up today. Myrtis Hopping

## 2015-04-05 NOTE — Telephone Encounter (Signed)
PA needed for Sporanox oral solution rx for histoplasmosis with pneumonia.  RN called pt's insurance to begin PA.  Insurance pharmacist shared that the Sporanox oral solution is ONLY approved for esophageal histoplasmosis not lung.  Pharmacist recommended changing to the Sporanox capsules which are approved for histoplasmosis of the lung.  The pharmacist mentioned that a PA will probably also need to be completed for the capsules.  Prescription returned to Dr. Johnnye Sima for his review.

## 2015-04-05 NOTE — Telephone Encounter (Signed)
[facility] to check on price and availability and return call to [facility].

## 2015-04-05 NOTE — Telephone Encounter (Signed)
PA obtained # in phone note

## 2015-04-05 NOTE — Telephone Encounter (Signed)
Patient's insurance called stating even with the prior authorization the cost is $304.00 and patient stated he can not afford, so he will not be getting this medication. Myrtis Hopping

## 2015-04-08 MED FILL — *SPORANOX 10 MG/ML SOLUTION: 10 | 27 days supply | Qty: 1200 | Fill #0

## 2015-04-15 ENCOUNTER — Other Ambulatory Visit: Payer: Self-pay | Admitting: Adult Health

## 2015-04-15 NOTE — Telephone Encounter (Signed)
Can have one additional refill. Looks like he needs to be seen

## 2015-04-29 ENCOUNTER — Ambulatory Visit: Payer: Medicare HMO | Admitting: Infectious Diseases

## 2015-05-15 ENCOUNTER — Other Ambulatory Visit: Payer: Self-pay | Admitting: Pharmacist Clinician (PhC)/ Clinical Pharmacy Specialist

## 2015-05-15 ENCOUNTER — Ambulatory Visit (INDEPENDENT_AMBULATORY_CARE_PROVIDER_SITE_OTHER): Payer: Medicare HMO | Admitting: Infectious Diseases

## 2015-05-15 VITALS — BP 162/65 | HR 69 | Temp 98.1°F | Ht 68.0 in | Wt 148.0 lb

## 2015-05-15 DIAGNOSIS — F4321 Adjustment disorder with depressed mood: Secondary | ICD-10-CM | POA: Diagnosis not present

## 2015-05-15 DIAGNOSIS — C3491 Malignant neoplasm of unspecified part of right bronchus or lung: Secondary | ICD-10-CM

## 2015-05-15 DIAGNOSIS — F4329 Adjustment disorder with other symptoms: Secondary | ICD-10-CM

## 2015-05-15 DIAGNOSIS — B399 Histoplasmosis, unspecified: Secondary | ICD-10-CM | POA: Diagnosis not present

## 2015-05-15 DIAGNOSIS — Z634 Disappearance and death of family member: Secondary | ICD-10-CM

## 2015-05-15 DIAGNOSIS — F17200 Nicotine dependence, unspecified, uncomplicated: Secondary | ICD-10-CM | POA: Diagnosis not present

## 2015-05-15 DIAGNOSIS — B392 Pulmonary histoplasmosis capsulati, unspecified: Secondary | ICD-10-CM

## 2015-05-15 MED ORDER — ITRACONAZOLE 100 MG PO CAPS: 200.0000 mg | ORAL_CAPSULE | Freq: Two times a day (BID) | ORAL | Status: AC

## 2015-05-15 MED ORDER — ITRACONAZOLE 10 MG/ML PO SOLN: 200.0000 mg | Freq: Two times a day (BID) | ORAL | Status: DC

## 2015-05-15 MED FILL — *ITRACONAZOLE 100 MG CAP: 100 | 30 days supply | Qty: 120 | Fill #0

## 2015-05-15 NOTE — Progress Notes (Signed)
Changing itraconazole to capsule formulation since susp is backordered. [place] with Dr. Johnnye Sima.

## 2015-05-15 NOTE — Assessment & Plan Note (Signed)
She will f/u with onc Doing well.

## 2015-05-15 NOTE — Assessment & Plan Note (Signed)
He has 2 more weeks of itra He is doing well Will check in with pharm Can rtc prn

## 2015-05-15 NOTE — Assessment & Plan Note (Signed)
He is encouraged to quit.  Considering chantix, afraid of SI risk. I encouraged him to try this. He will f/u with his PCP.

## 2015-05-15 NOTE — Progress Notes (Signed)
   Subjective:    Patient ID: Timothy Wood, male    DOB: Apr 29, 1940, 75 y.o.   MRN: [id number]  HPI 75 yo M who underwent VATS 01-2015 that showed 1a AdenoCa and histo.  He has been on itra since ID visit on 04-01-15.  He was able to get his rx [facility] for decreased rate.  Has had some diarrhea since on itra, has once a week, lasts for 1 day.  No vision change. No change in color vision.  Has been depressed due to loss of spouse and son due to Ca last year.  His daughter and his sisters check in on him regularly.   Still smoking.   Review of Systems  Constitutional: Negative for fever, chills, appetite change and unexpected weight change.  Respiratory: Positive for cough. Negative for shortness of breath.   Gastrointestinal: Positive for diarrhea.  Genitourinary: Negative for difficulty urinating.  Psychiatric/Behavioral: Positive for dysphoric mood.       Objective:   Physical Exam  Constitutional: He appears well-developed and well-nourished.  HENT:  Mouth/Throat: No oropharyngeal exudate.  Eyes: EOM are normal. Pupils are equal, round, and reactive to light.  Neck: Neck supple.  Cardiovascular: Normal rate, regular rhythm and normal heart sounds.   Pulmonary/Chest: Effort normal and breath sounds normal.  Abdominal: Soft. Bowel sounds are normal. There is no tenderness. There is no rebound.  Lymphadenopathy:    He has no cervical adenopathy.      Assessment & Plan:

## 2015-05-15 NOTE — Assessment & Plan Note (Signed)
He denies SI He is going to go [facility]

## 2015-05-28 ENCOUNTER — Ambulatory Visit (INDEPENDENT_AMBULATORY_CARE_PROVIDER_SITE_OTHER): Payer: Medicare HMO | Admitting: Neurology

## 2015-05-28 ENCOUNTER — Encounter: Payer: Self-pay | Admitting: Neurology

## 2015-05-28 VITALS — BP 100/62 | HR 102 | Ht 69.0 in | Wt 149.0 lb

## 2015-05-28 DIAGNOSIS — G250 Essential tremor: Secondary | ICD-10-CM

## 2015-05-28 MED ORDER — PRIMIDONE 50 MG PO TABS: 50.0000 mg | ORAL_TABLET | Freq: Every day | ORAL | Status: DC

## 2015-05-28 NOTE — Patient Instructions (Signed)
1. Start Primidone 50 mg tablets. Take 1/2 tablet for 4 nights, then increase to 1 tablet. Prescription has been sent to your pharmacy. The first dose of medication can cause some dizziness/nausea that should go away after the first dose.   

## 2015-05-28 NOTE — Progress Notes (Signed)
Timothy Wood was seen today in the movement disorders clinic for neurologic consultation at the request of Dorothyann Peng.  The consultation is for the evaluation of tremor and to r/o PD.  He is accompanied by his daughter who supplements the history.  He states that he is leaving Friday for extended vacation and was hoping to try and get some answers before he goes.    Specific Symptoms:  Tremor: Yes.  , started it in the head when he was in his 50's.  Started in the hands in 2022-04-03 after his wife died.  Both hands shake equally.  Right hand dominant but both shake equally.  Bothers him when attempting to eat or with fine motor coordinations.  Better once he increased the celexa (only increased one week ago).  States that it helped at least 60% when it was increased.  There is a family history of tremor in his mother.  His brother also had tremor but they attributed that alcohol.  Affected by caffeine:  No.  Affected by alcohol:  No.  Affected by stress:  Yes.    Affected by fatigue:  No.  Spills soup if on spoon:  Yes.    Spills glass of liquid if full: intermittently  Affects ADL's (tying shoes, brushing teeth, etc):  No.    Voice: no change Sleep: sleeps during the day if tired so no regular sleep schedule  Vivid Dreams:  Yes.    Acting out dreams:  No. (not in a long time) Wet Pillows: No. Postural symptoms:  Yes.  , just a little off balance for years  Falls?  No. Bradykinesia symptoms: difficulty getting out of a chair ("old age") Loss of smell:  No. Loss of taste:  No. Urinary Incontinence:  No. Difficulty Swallowing:  Yes.   (sometimes will choke on own saliva, otherwise does well) Handwriting, micrographia: No. (is illegible because of tremor) Memory changes:  No. N/V:  No. Lightheaded:  Yes.  , occasionally when first stands up or when looks straight up  Syncope: No. Diplopia:  No.  05/28/15 update:  The patient is following up today regarding tremor, accompanied by  his daughter who supplements the hx.  I have not seen him since July, 2016.  At that point in time, he was not any medication specifically for tremor, but he was on Celexa and stated that since anxiety and stress increased his tremor, the Celexa did seem to help somewhat.  I have reviewed records since our last visit.  He was hospitalized in November, 2016 for a VATS procedure/wedge resection that turned out to be a minimally invasive adenocarcinoma.  Unfortunately, he is still smoking.  No other treatment was needed per oncology.  He subsequently followed up with infectious disease as histoplasmosis was also noted on his pathology.  He was treated with itraconazole.  Tremor comes and goes; states that it is never good but sometimes "I rattle so hard I almost feel it in my bones."  Still shaves with blade.  Brother with hx of tremor but states that brother drank lots of alcohol.  Daughter states that pt drinking increasing - pt states drinking 3-4 drinks of bourboun 2 nights a week.  Tremor more disabling than last year.  He is having more trouble holding glass.  Plans to be travelling again.  Going to [place] in a week for 3 weeks and then to [place] again for the summer.   ALLERGIES:   Allergies  Allergen Reactions  .  Hydrocodone Itching    CURRENT MEDICATIONS:  Outpatient Encounter Prescriptions as of 05/28/2015  Medication Sig  . amLODipine (NORVASC) 5 MG tablet TAKE 1 TABLET (5 MG TOTAL) BY MOUTH DAILY.  [name] Kitchen aspirin 325 MG tablet Take 325 mg by mouth every morning.  . citalopram (CELEXA) 20 MG tablet TAKE 1 TABLET (20 MG TOTAL) BY MOUTH DAILY.  [name] Kitchen clopidogrel (PLAVIX) 75 MG tablet TAKE 1 TABLET (75 MG TOTAL) BY MOUTH EVERY MORNING.  . CRESTOR 40 MG tablet TAKE 1 TABLET (40 MG TOTAL) BY MOUTH DAILY.  [name] Kitchen irbesartan-hydrochlorothiazide (AVALIDE) 150-12.5 MG per tablet Take 1 tablet by mouth daily.  [name] Kitchen itraconazole (SPORANOX) 100 MG capsule Take 2 capsules (200 mg total) by mouth 2 (two) times daily with a  meal.  . omeprazole (PRILOSEC) 20 MG capsule TAKE 1 CAPSULE (20 MG TOTAL) BY MOUTH 2 (TWO) TIMES DAILY BEFORE A MEAL.  . [DISCONTINUED] cyclobenzaprine (FLEXERIL) 5 MG tablet Take 1 tablet (5 mg total) by mouth 2 (two) times daily as needed for muscle spasms.   No facility-administered encounter medications on file as of 05/28/2015.    PAST MEDICAL HISTORY:   Past Medical History  Diagnosis Date  . Hypertension   . Tobacco abuse   . Pulmonary nodule 06/2006    9 mm right upper obe pulmonary nodule (negative Pet Scan)  . Personal history of colonic polyps 2008    multiple adenomas 2008-2009 exams with large cecal adenoma  . Hyperlipidemia   . PVD (peripheral vascular disease) ([place])     left leg s/p stent  . Seborrheic keratosis   . Sebaceous cyst     neck  . Sinusitis   . Blood clotting disorder ([place])   . Diverticulosis   . GERD (gastroesophageal reflux disease)   . Chronic obstructive pulmonary disease (COPD) ([place])     "patient not aware"  . Pneumonia     in history  . Depression   . Tremor   . DVT (deep venous thrombosis) ([place])     Post op  knee surgery  . Arthritis   . Histoplasmosis   . Adenocarcinoma of lung, stage 1 (HCC)     PAST SURGICAL HISTORY:   Past Surgical History  Procedure Laterality Date  . Back surgery  1975, 1978    x2 lumbar fusion over 30 years ago  . Knee surgery  2002    left- cartilage   . Tonsillectomy  1956  . Angioplasty / stenting iliac  08/2008    Dr. Trula Slade  . Descending aortic aneurysm repair w/ stent  04/2010    Dr. Trula Slade  . Colonoscopy  05/11/2006; 07/27/2006;05/05/2007, 11/24/2010    adenomas with 9 polyps index and 2.5 cm cecal polyp removed over 3 exams, last 2009 with APC. diverticulosis also.  2012: 12 adenomas removed, largest 8-10 mm, diverticulosis  . Esophagogastroduodenoscopy  05/05/2007    GERD - one erosion  . Percutaneous pinning Left 06/24/2012    Procedure: Repair Complex Laceration Left Thumb with percutaneous pinning.;   Surgeon: Dennie Bible, MD;  Location: [facility];  Service: Plastics;  Laterality: Left;  . Thumb amputation Left     pinning and index finger  . Eye surgery Bilateral     cataract  . Video assisted thoracoscopy (vats)/wedge resection Right 01/24/2015    Procedure: RIGHT VIDEO ASSISTED THORACOSCOPY (VATS)/WEDGE RESECTION;  Surgeon: [name], MD;  Location: [facility];  Service: Thoracic;  Laterality: Right;    SOCIAL HISTORY:   Social History  Social History  . Marital Status: Married    Spouse Name: N/A  . Number of Children: 2  . Years of Education: N/A   Occupational History  . Retired    Social History Main Topics  . Smoking status: Current Every Day Smoker -- 0.75 packs/day for 60 years    Types: Cigarettes  . Smokeless tobacco: Never Used  . Alcohol Use: 0.0 oz/week    0 Standard drinks or equivalent per week     Comment: occasional  . Drug Use: No  . Sexual Activity: Not Currently   Other Topics Concern  . Not on file   Social History Narrative    Married for over 50 years.  Wife died from lung cancer.     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     Occasional alcohol.      Tobacco use - over 50 pack years.   Father died at age 13 of suicide.   [facility]-daughter emergency contact    FAMILY HISTORY:   Family Status  Relation Status Death Age  . Mother Deceased 21    essential tremor  . Son Deceased     lung CA  . Father Deceased 40    ROS:  A complete 10 system review of systems was obtained and was unremarkable apart from what is mentioned above.  PHYSICAL EXAMINATION:    VITALS:   Filed Vitals:   05/28/15 1518  BP: 100/62  Pulse: 102  Height: '5\' 9"'$  (1.753 m)  Weight: 149 lb (67.586 kg)    GEN:  The patient appears stated age and is in NAD. HEENT:  Normocephalic, atraumatic.  The mucous membranes are moist. The superficial temporal arteries are without ropiness or tenderness. CV:  RRR Lungs:   CTAB Neck/HEME:  There are no carotid bruits bilaterally.  Neurological examination:  Orientation: The patient is alert and oriented x3.  Cranial nerves: There is good facial symmetry.  The visual fields are full to confrontational testing. The speech is fluent and clear. Soft palate rises symmetrically and there is no tongue deviation. Hearing is intact to conversational tone. Sensation: Sensation is intact to light touch throughout Motor: Strength is 5/5 in the bilateral upper and lower extremities.   Shoulder shrug is equal and symmetric.  There is no pronator drift.   Movement examination: Tone: There is normal tone in the bilateral upper extremities.  The tone in the lower extremities is normal.  Abnormal movements: There is head tremor in the "yes" direction.  There is occ rest tremor in the RLE.  This was seen last year as well.    There is at least mod tremor of outstretched hands.  Today the left was worse than the right.   He does have difficulty with Archimedes spirals.  He does spill some water when pouring it from one glass to another. Coordination:  There is no decremation with RAM's, with any form of rapid alternating movements, including hand opening and closing, alternation of supination/pronation of the forearm, finger taps, toe taps, heel taps.  He does have some difficulty with finger-nose-finger with the eyes closed but has no difficulty with the eyes open, with the exception of intention tremor. Gait and Station: The patient has no difficulty arising out of a deep-seated chair without the use of the hands. The patient's stride length is normal.    Lab Results  Component Value Date   TSH 2.42 06/29/2014     Chemistry  Component Value Date/Time   NA 140 04/01/2015 1024   K 4.0 04/01/2015 1024   CL 105 04/01/2015 1024   CO2 26 04/01/2015 1024   BUN 17 04/01/2015 1024   CREATININE 1.04 04/01/2015 1024   CREATININE 0.80 01/26/2015 0410      Component Value  Date/Time   CALCIUM 8.9 04/01/2015 1024   ALKPHOS 61 04/01/2015 1024   AST 13 04/01/2015 1024   ALT 10 04/01/2015 1024   BILITOT 0.3 04/01/2015 1024       ASSESSMENT/PLAN:  1.  Essential tremor  -This is evidenced by the long-standing nature of head tremor and a family history of tremor.   -Decrease caffeine intake  -Decrease EtOH intake and would like to see him d/c  -start primidone 50 mg, 1/2 po q hs x 4 nights and then increase to 1 po q hs.  Will likely need much more than this.  Risks, benefits, side effects and alternative therapies were discussed.  The opportunity to ask questions was given and they were answered to the best of my ability.  The patient expressed understanding and willingness to follow the outlined treatment protocols.  -talked to him more in detail about DBS therapy.  Asked questions and answered to best of my ability.   2.  Tob abuse  -s/p VATS/wedge resection for stage 1 adenoCA.  Talked about importance of d/c tobacco 3.  Follow up is anticipated in the next few months, sooner should new neurologic issues arise.  Much greater than 50% of this visit was spent in counseling with the patient and the family.  Total face to face time:  40 min

## 2015-06-03 ENCOUNTER — Other Ambulatory Visit: Payer: Self-pay | Admitting: Adult Health

## 2015-06-14 ENCOUNTER — Other Ambulatory Visit: Payer: Self-pay | Admitting: Internal Medicine

## 2015-06-20 ENCOUNTER — Other Ambulatory Visit: Payer: Self-pay | Admitting: Internal Medicine

## 2015-06-21 ENCOUNTER — Encounter: Payer: Self-pay | Admitting: Family Medicine

## 2015-06-21 ENCOUNTER — Ambulatory Visit (INDEPENDENT_AMBULATORY_CARE_PROVIDER_SITE_OTHER): Payer: Medicare HMO | Admitting: Family Medicine

## 2015-06-21 ENCOUNTER — Other Ambulatory Visit: Payer: Self-pay | Admitting: Adult Health

## 2015-06-21 VITALS — BP 120/58 | HR 108 | Temp 98.1°F | Wt 150.5 lb

## 2015-06-21 DIAGNOSIS — I2510 Atherosclerotic heart disease of native coronary artery without angina pectoris: Secondary | ICD-10-CM

## 2015-06-21 DIAGNOSIS — I2584 Coronary atherosclerosis due to calcified coronary lesion: Secondary | ICD-10-CM

## 2015-06-21 DIAGNOSIS — I739 Peripheral vascular disease, unspecified: Secondary | ICD-10-CM

## 2015-06-21 DIAGNOSIS — I10 Essential (primary) hypertension: Secondary | ICD-10-CM | POA: Diagnosis not present

## 2015-06-21 DIAGNOSIS — K219 Gastro-esophageal reflux disease without esophagitis: Secondary | ICD-10-CM

## 2015-06-21 DIAGNOSIS — F419 Anxiety disorder, unspecified: Secondary | ICD-10-CM

## 2015-06-21 DIAGNOSIS — E785 Hyperlipidemia, unspecified: Secondary | ICD-10-CM

## 2015-06-21 MED ORDER — CLOPIDOGREL BISULFATE 75 MG PO TABS: ORAL_TABLET | ORAL | Status: DC

## 2015-06-21 MED ORDER — IRBESARTAN-HYDROCHLOROTHIAZIDE 150-12.5 MG PO TABS: 1.0000 | ORAL_TABLET | Freq: Every day | ORAL | Status: DC

## 2015-06-21 MED ORDER — OMEPRAZOLE 20 MG PO CPDR: DELAYED_RELEASE_CAPSULE | ORAL | Status: DC

## 2015-06-21 MED ORDER — AMLODIPINE BESYLATE 5 MG PO TABS: ORAL_TABLET | ORAL | Status: DC

## 2015-06-21 NOTE — Progress Notes (Addendum)
Subjective:    Patient ID: Timothy Wood, male    DOB: 08/04/1940, 75 y.o.   MRN: [id number]  HPI  Mr.  Timothy Wood is a 75 y.o.male here today to establish care with me and to follow on some of his chronic medical problems. He has his dog with him today. He has hx of PAD,CAD,HLD,HTN, COPD,and tremor among some. Recently Dx with lung hystoplasmosis and currently on antimycotic treatment, about to complete; follows with ID. He is requesting refills on his meds today. Independent ADL's and IADL's.  HTN: Currently he is on Amlodipine 5 mg daily and Irbersartan-HCTZ 150-12.5 mg daily. Denies severe/frequent headache, visual changes, chest pain, dyspnea, palpitation, claudication, focal weakness, or worsening LE edema.S/P DVT LLE after knee surgery. He checks his BP at home and readings are similar to the one obtain today.  + Tremor, chronic and follows with neurologists.     Chemistry      Component Value Date/Time   NA 140 04/01/2015 1024   K 4.0 04/01/2015 1024   CL 105 04/01/2015 1024   CO2 26 04/01/2015 1024   BUN 17 04/01/2015 1024   CREATININE 1.04 04/01/2015 1024   CREATININE 0.80 01/26/2015 0410      Component Value Date/Time   CALCIUM 8.9 04/01/2015 1024   ALKPHOS 61 04/01/2015 1024   AST 13 04/01/2015 1024   ALT 10 04/01/2015 1024   BILITOT 0.3 04/01/2015 1024     HLD: Currently he is on Crestor 40 mg daily. Tolerating well with no side effects reported. He thinks his diet is healthy and walks his dog a couple times per week.  Still smoking, he has not tried to decrease it. He doe snot believe lung cancer is related to smoking, he has hx of lung cancer and still following with oncologists q 6 months. COPD, denies any cough or wheezing, no dyspnea. He is not on treatment with inhalers.  Lab Results  Component Value Date   CHOL 136 06/29/2014   HDL 38.40* 06/29/2014   LDLCALC 67 06/29/2014   LDLDIRECT 118.8 06/14/2006   TRIG 155.0* 06/29/2014   CHOLHDL 4 06/29/2014    Anxiety: He is on Celexa 20 mg, which he states started by former PCP a couple years ago and he believes it is for anxiety and tremors. He denies depression symptoms. On his problem list grieving is listed. He tells me that recently he lost his wife and son because lung cancer in 2016.  He lives alone. Denies any suicidal thought.  GERD: On Omeprazole 20 mg bid, he is asymptomatic if he takes medication daily. Denies abdominal pain, nausea, or vomiting. No changes in bowel habits.   In general he is tolerating all his medications well with no side effects.   Review of Systems  Constitutional: Negative for fever, activity change, appetite change, fatigue and unexpected weight change.  HENT: Negative for nosebleeds, sore throat and trouble swallowing.   Eyes: Negative for redness and visual disturbance.  Respiratory: Negative for apnea, cough, shortness of breath and wheezing.        + smoker   Cardiovascular: Positive for leg swelling (stable). Negative for chest pain and palpitations.  Gastrointestinal: Negative for nausea, vomiting and abdominal pain.  Genitourinary: Negative for dysuria, hematuria and decreased urine volume.  Skin: Negative for rash.  Neurological: Positive for tremors (Hx of tremor, stable). Negative for dizziness, seizures, weakness, numbness and headaches.  Psychiatric/Behavioral: Negative for suicidal ideas and hallucinations. The patient is nervous/anxious (stable).  Current Outpatient Prescriptions on File Prior to Visit  Medication Sig Dispense Refill  . aspirin 325 MG tablet Take 325 mg by mouth every morning.    . citalopram (CELEXA) 20 MG tablet TAKE 1 TABLET (20 MG TOTAL) BY MOUTH DAILY. 30 tablet 0  . CRESTOR 40 MG tablet TAKE 1 TABLET (40 MG TOTAL) BY MOUTH DAILY. 30 tablet 1  . itraconazole (SPORANOX) 100 MG capsule Take 2 capsules (200 mg total) by mouth 2 (two) times daily with a meal. 120 capsule 1  . primidone  (MYSOLINE) 50 MG tablet Take 1 tablet (50 mg total) by mouth at bedtime. 90 tablet 1   No current facility-administered medications on file prior to visit.     Past Medical History  Diagnosis Date  . Hypertension   . Tobacco abuse   . Pulmonary nodule 06/2006    9 mm right upper obe pulmonary nodule (negative Pet Scan)  . Personal history of colonic polyps 2008    multiple adenomas 2008-2009 exams with large cecal adenoma  . Hyperlipidemia   . PVD (peripheral vascular disease) ([place])     left leg s/p stent  . Seborrheic keratosis   . Sebaceous cyst     neck  . Sinusitis   . Blood clotting disorder ([place])   . Diverticulosis   . GERD (gastroesophageal reflux disease)   . Chronic obstructive pulmonary disease (COPD) ([place])     "patient not aware"  . Pneumonia     in history  . Depression   . Tremor   . DVT (deep venous thrombosis) ([place])     Post op  knee surgery  . Arthritis   . Histoplasmosis   . Adenocarcinoma of lung, stage 1 (HCC)     Social History   Social History  . Marital Status: Married    Spouse Name: N/A  . Number of Children: 2  . Years of Education: N/A   Occupational History  . Retired    Social History Main Topics  . Smoking status: Current Every Day Smoker -- 0.75 packs/day for 60 years    Types: Cigarettes  . Smokeless tobacco: Never Used  . Alcohol Use: 0.0 oz/week    0 Standard drinks or equivalent per week     Comment: occasional  . Drug Use: No  . Sexual Activity: Not Currently   Other Topics Concern  . None   Social History Narrative    Married for over 50 years.  Wife died from lung cancer.     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     Occasional alcohol.      Tobacco use - over 50 pack years.   Father died at age 44 of suicide.   [facility]-daughter emergency contact    Filed Vitals:   06/21/15 0934  BP: 120/58  Pulse: 108  Temp: 98.1 F (36.7 C)   Body mass index is 22.21 kg/(m^2).         Objective:   Physical Exam  Constitutional: He is oriented to person, place, and time. He appears well-developed and well-nourished. No distress.  HENT:  Head: Normocephalic and atraumatic.  Eyes: Conjunctivae and EOM are normal. Pupils are equal, round, and reactive to light.  Neck: Neck supple. No thyromegaly present.  Cardiovascular: Normal rate and regular rhythm.   No murmur heard. Pulses:      Dorsalis pedis pulses are 2+ on the right side, and 2+ on the left side.  HR by my count 84/min.  Pulmonary/Chest: Effort normal. He has no wheezes. He has no rales.  Abdominal: Soft. He exhibits no distension and no mass. There is no tenderness.  Musculoskeletal: He exhibits edema (RLE trace pitting LLE 1+ pitting edema). He exhibits no tenderness.  Lymphadenopathy:    He has no cervical adenopathy.  Neurological: He is alert and oriented to person, place, and time. He displays tremor. He exhibits normal muscle tone. Coordination and gait normal.  Skin: Skin is warm. No rash noted.  Psychiatric: He has a normal mood and affect.       Assessment & Plan:    Deni was seen today for medication refill.  Diagnoses and all orders for this visit:  Peripheral vascular disease ([place]) Strongly recommended smoking cessation. Asymptomatic. Continue statin and Plavix.  Essential hypertension Adequately controlled. No changes in current management. DASH diet recommended. Eye exam recommended annually. F/U in 6 months.   -     BMP with eGFR -     Lipid Panel -     amLODipine (NORVASC) 5 MG tablet; TAKE 1 TABLET (5 MG TOTAL) BY MOUTH DAILY. -     irbesartan-hydrochlorothiazide (AVALIDE) 150-12.5 MG tablet; Take 1 tablet by mouth daily.  Coronary atherosclerosis due to calcified coronary lesion of native artery Asymptomatic. Tobacco adverse effects discussed. No changes in current management.   -     clopidogrel (PLAVIX) 75 MG tablet; TAKE 1 TABLET (75 MG TOTAL) BY MOUTH EVERY  MORNING.  Hyperlipidemia Low fat diet discussed. No changes in Crestor. Further recommendations will be given according to lab results.  Anxiety disorder, unspecified Stable. For now no changes in Celexa but I would like to replace it for another anxiolytic given his age and hx of CVD.  Gastroesophageal reflux disease without esophagitis Asymptomatic. PPI side discussed. GERD precautions discussed.  -     omeprazole (PRILOSEC) 20 MG capsule; TAKE 1 CAPSULE (20 MG TOTAL) BY MOUTH 2 (TWO) TIMES DAILY BEFORE A MEAL.     [name]. [name], MD  [facility]. [place] office.

## 2015-06-21 NOTE — Patient Instructions (Addendum)
DASH diet recommended: high in vegetables, fruits, low-fat dairy products, whole grains, poultry, fish, and nuts; and low in sweets, sugar-sweetened beverages, and red meats.  Smoking cessation strongly recommended.  Monitor BP at home. Healthy diet and regular exercise to continue. GERD: continue avoiding food that triggers symptoms.  Next appt will be a physical and labs will be done 2-3 days prior to visit.

## 2015-06-21 NOTE — Progress Notes (Signed)
Pre visit review using our clinic review tool, if applicable. No additional management support is needed unless otherwise documented below in the visit note. 

## 2015-06-21 NOTE — Telephone Encounter (Signed)
Ok to refill 

## 2015-06-21 NOTE — Telephone Encounter (Signed)
How is he doing on this new dose

## 2015-06-24 NOTE — Telephone Encounter (Signed)
Left message for patient to return phone call.  

## 2015-06-26 ENCOUNTER — Other Ambulatory Visit: Payer: Self-pay | Admitting: Adult Health

## 2015-06-26 NOTE — Telephone Encounter (Signed)
Left message for patient to return phone call.  

## 2015-06-27 ENCOUNTER — Other Ambulatory Visit: Payer: Self-pay | Admitting: Family Medicine

## 2015-06-27 DIAGNOSIS — I10 Essential (primary) hypertension: Secondary | ICD-10-CM

## 2015-06-27 DIAGNOSIS — E785 Hyperlipidemia, unspecified: Secondary | ICD-10-CM

## 2015-06-27 NOTE — Telephone Encounter (Signed)
3rd attempt at contacting patient & unable to reach. Left message for patient to return phone call.

## 2015-06-27 NOTE — Telephone Encounter (Signed)
Per Tommi Rumps, ok to refill this medication.

## 2015-07-29 ENCOUNTER — Ambulatory Visit: Payer: Medicare HMO | Admitting: Neurology

## 2015-08-05 ENCOUNTER — Inpatient Hospital Stay (HOSPITAL_COMMUNITY)
Admission: EM | Admit: 2015-08-05 | Discharge: 2015-08-09 | DRG: 907 | Disposition: A | Discharge status: Home Health | Payer: Medicare HMO | Attending: Internal Medicine | Admitting: Internal Medicine

## 2015-08-05 ENCOUNTER — Encounter (HOSPITAL_COMMUNITY): Payer: Self-pay | Admitting: *Deleted

## 2015-08-05 ENCOUNTER — Ambulatory Visit (INDEPENDENT_AMBULATORY_CARE_PROVIDER_SITE_OTHER): Payer: Medicare HMO | Admitting: Family Medicine

## 2015-08-05 ENCOUNTER — Emergency Department (HOSPITAL_COMMUNITY): Payer: Medicare HMO

## 2015-08-05 ENCOUNTER — Other Ambulatory Visit (HOSPITAL_COMMUNITY): Payer: Self-pay | Admitting: Pharmacy Technician

## 2015-08-05 ENCOUNTER — Encounter: Payer: Self-pay | Admitting: Family Medicine

## 2015-08-05 VITALS — BP 110/60 | HR 84 | Temp 97.5°F | Resp 12 | Wt 144.3 lb

## 2015-08-05 DIAGNOSIS — Z86711 Personal history of pulmonary embolism: Secondary | ICD-10-CM | POA: Diagnosis present

## 2015-08-05 DIAGNOSIS — S32029A Unspecified fracture of second lumbar vertebra, initial encounter for closed fracture: Secondary | ICD-10-CM | POA: Diagnosis not present

## 2015-08-05 DIAGNOSIS — Z85118 Personal history of other malignant neoplasm of bronchus and lung: Secondary | ICD-10-CM

## 2015-08-05 DIAGNOSIS — S0190XA Unspecified open wound of unspecified part of head, initial encounter: Secondary | ICD-10-CM | POA: Diagnosis not present

## 2015-08-05 DIAGNOSIS — S32028A Other fracture of second lumbar vertebra, initial encounter for closed fracture: Secondary | ICD-10-CM | POA: Diagnosis not present

## 2015-08-05 DIAGNOSIS — S61411A Laceration without foreign body of right hand, initial encounter: Secondary | ICD-10-CM | POA: Diagnosis not present

## 2015-08-05 DIAGNOSIS — Z86718 Personal history of other venous thrombosis and embolism: Secondary | ICD-10-CM

## 2015-08-05 DIAGNOSIS — R220 Localized swelling, mass and lump, head: Secondary | ICD-10-CM | POA: Diagnosis not present

## 2015-08-05 DIAGNOSIS — F10229 Alcohol dependence with intoxication, unspecified: Secondary | ICD-10-CM | POA: Diagnosis present

## 2015-08-05 DIAGNOSIS — E162 Hypoglycemia, unspecified: Secondary | ICD-10-CM | POA: Diagnosis present

## 2015-08-05 DIAGNOSIS — S0990XA Unspecified injury of head, initial encounter: Principal | ICD-10-CM | POA: Diagnosis present

## 2015-08-05 DIAGNOSIS — R42 Dizziness and giddiness: Secondary | ICD-10-CM

## 2015-08-05 DIAGNOSIS — S32039A Unspecified fracture of third lumbar vertebra, initial encounter for closed fracture: Secondary | ICD-10-CM | POA: Diagnosis present

## 2015-08-05 DIAGNOSIS — I959 Hypotension, unspecified: Secondary | ICD-10-CM

## 2015-08-05 DIAGNOSIS — N400 Enlarged prostate without lower urinary tract symptoms: Secondary | ICD-10-CM

## 2015-08-05 DIAGNOSIS — I2699 Other pulmonary embolism without acute cor pulmonale: Secondary | ICD-10-CM | POA: Diagnosis not present

## 2015-08-05 DIAGNOSIS — I2510 Atherosclerotic heart disease of native coronary artery without angina pectoris: Secondary | ICD-10-CM | POA: Diagnosis present

## 2015-08-05 DIAGNOSIS — R918 Other nonspecific abnormal finding of lung field: Secondary | ICD-10-CM | POA: Diagnosis not present

## 2015-08-05 DIAGNOSIS — N179 Acute kidney failure, unspecified: Secondary | ICD-10-CM | POA: Diagnosis not present

## 2015-08-05 DIAGNOSIS — W1830XA Fall on same level, unspecified, initial encounter: Secondary | ICD-10-CM | POA: Diagnosis present

## 2015-08-05 DIAGNOSIS — Z7902 Long term (current) use of antithrombotics/antiplatelets: Secondary | ICD-10-CM

## 2015-08-05 DIAGNOSIS — E7800 Pure hypercholesterolemia, unspecified: Secondary | ICD-10-CM | POA: Diagnosis present

## 2015-08-05 DIAGNOSIS — S299XXA Unspecified injury of thorax, initial encounter: Secondary | ICD-10-CM | POA: Diagnosis not present

## 2015-08-05 DIAGNOSIS — Y906 Blood alcohol level of 120-199 mg/100 ml: Secondary | ICD-10-CM | POA: Diagnosis present

## 2015-08-05 DIAGNOSIS — S61419A Laceration without foreign body of unspecified hand, initial encounter: Secondary | ICD-10-CM | POA: Insufficient documentation

## 2015-08-05 DIAGNOSIS — E872 Acidosis, unspecified: Secondary | ICD-10-CM

## 2015-08-05 DIAGNOSIS — E876 Hypokalemia: Secondary | ICD-10-CM | POA: Diagnosis not present

## 2015-08-05 DIAGNOSIS — I10 Essential (primary) hypertension: Secondary | ICD-10-CM

## 2015-08-05 DIAGNOSIS — M899 Disorder of bone, unspecified: Secondary | ICD-10-CM

## 2015-08-05 DIAGNOSIS — R4182 Altered mental status, unspecified: Secondary | ICD-10-CM | POA: Diagnosis not present

## 2015-08-05 DIAGNOSIS — F10129 Alcohol abuse with intoxication, unspecified: Secondary | ICD-10-CM | POA: Diagnosis not present

## 2015-08-05 DIAGNOSIS — C3491 Malignant neoplasm of unspecified part of right bronchus or lung: Secondary | ICD-10-CM

## 2015-08-05 DIAGNOSIS — S32000A Wedge compression fracture of unspecified lumbar vertebra, initial encounter for closed fracture: Secondary | ICD-10-CM

## 2015-08-05 DIAGNOSIS — D62 Acute posthemorrhagic anemia: Secondary | ICD-10-CM | POA: Diagnosis not present

## 2015-08-05 DIAGNOSIS — Z789 Other specified health status: Secondary | ICD-10-CM | POA: Insufficient documentation

## 2015-08-05 DIAGNOSIS — T1490XA Injury, unspecified, initial encounter: Secondary | ICD-10-CM

## 2015-08-05 DIAGNOSIS — Z7289 Other problems related to lifestyle: Secondary | ICD-10-CM

## 2015-08-05 DIAGNOSIS — G250 Essential tremor: Secondary | ICD-10-CM | POA: Diagnosis present

## 2015-08-05 DIAGNOSIS — S32010A Wedge compression fracture of first lumbar vertebra, initial encounter for closed fracture: Secondary | ICD-10-CM | POA: Insufficient documentation

## 2015-08-05 DIAGNOSIS — I9589 Other hypotension: Secondary | ICD-10-CM | POA: Diagnosis not present

## 2015-08-05 DIAGNOSIS — I951 Orthostatic hypotension: Secondary | ICD-10-CM

## 2015-08-05 DIAGNOSIS — S0191XA Laceration without foreign body of unspecified part of head, initial encounter: Secondary | ICD-10-CM | POA: Diagnosis present

## 2015-08-05 DIAGNOSIS — R55 Syncope and collapse: Secondary | ICD-10-CM

## 2015-08-05 DIAGNOSIS — S199XXA Unspecified injury of neck, initial encounter: Secondary | ICD-10-CM | POA: Diagnosis not present

## 2015-08-05 DIAGNOSIS — F17200 Nicotine dependence, unspecified, uncomplicated: Secondary | ICD-10-CM | POA: Diagnosis present

## 2015-08-05 DIAGNOSIS — F1090 Alcohol use, unspecified, uncomplicated: Secondary | ICD-10-CM

## 2015-08-05 DIAGNOSIS — N289 Disorder of kidney and ureter, unspecified: Secondary | ICD-10-CM

## 2015-08-05 DIAGNOSIS — S0101XA Laceration without foreign body of scalp, initial encounter: Secondary | ICD-10-CM | POA: Diagnosis present

## 2015-08-05 DIAGNOSIS — K7689 Other specified diseases of liver: Secondary | ICD-10-CM | POA: Diagnosis not present

## 2015-08-05 DIAGNOSIS — K219 Gastro-esophageal reflux disease without esophagitis: Secondary | ICD-10-CM | POA: Diagnosis present

## 2015-08-05 DIAGNOSIS — S32000G Wedge compression fracture of unspecified lumbar vertebra, subsequent encounter for fracture with delayed healing: Secondary | ICD-10-CM | POA: Diagnosis not present

## 2015-08-05 DIAGNOSIS — Y92009 Unspecified place in unspecified non-institutional (private) residence as the place of occurrence of the external cause: Secondary | ICD-10-CM

## 2015-08-05 DIAGNOSIS — Z8679 Personal history of other diseases of the circulatory system: Secondary | ICD-10-CM | POA: Diagnosis present

## 2015-08-05 DIAGNOSIS — N281 Cyst of kidney, acquired: Secondary | ICD-10-CM | POA: Diagnosis not present

## 2015-08-05 DIAGNOSIS — Z955 Presence of coronary angioplasty implant and graft: Secondary | ICD-10-CM

## 2015-08-05 DIAGNOSIS — Z79899 Other long term (current) drug therapy: Secondary | ICD-10-CM

## 2015-08-05 DIAGNOSIS — S61411D Laceration without foreign body of right hand, subsequent encounter: Secondary | ICD-10-CM | POA: Diagnosis not present

## 2015-08-05 DIAGNOSIS — F10929 Alcohol use, unspecified with intoxication, unspecified: Secondary | ICD-10-CM | POA: Diagnosis present

## 2015-08-05 DIAGNOSIS — D649 Anemia, unspecified: Secondary | ICD-10-CM

## 2015-08-05 DIAGNOSIS — Z7982 Long term (current) use of aspirin: Secondary | ICD-10-CM

## 2015-08-05 DIAGNOSIS — S32000D Wedge compression fracture of unspecified lumbar vertebra, subsequent encounter for fracture with routine healing: Secondary | ICD-10-CM | POA: Diagnosis not present

## 2015-08-05 DIAGNOSIS — C3490 Malignant neoplasm of unspecified part of unspecified bronchus or lung: Secondary | ICD-10-CM | POA: Diagnosis present

## 2015-08-05 DIAGNOSIS — E860 Dehydration: Secondary | ICD-10-CM | POA: Diagnosis present

## 2015-08-05 DIAGNOSIS — R69 Illness, unspecified: Secondary | ICD-10-CM | POA: Diagnosis not present

## 2015-08-05 HISTORY — DX: Pure hypercholesterolemia, unspecified: E78.00

## 2015-08-05 HISTORY — DX: Laceration without foreign body of unspecified part of head, initial encounter: S01.91XA

## 2015-08-05 HISTORY — DX: Malignant neoplasm of unspecified part of unspecified bronchus or lung: C34.90

## 2015-08-05 LAB — COMPREHENSIVE METABOLIC PANEL
ALT: 15 U/L — AB (ref 17–63)
AST: 28 U/L (ref 15–41)
Albumin: 3.2 g/dL — ABNORMAL LOW (ref 3.5–5.0)
Alkaline Phosphatase: 72 U/L (ref 38–126)
Anion gap: 12 (ref 5–15)
BILIRUBIN TOTAL: 0.9 mg/dL (ref 0.3–1.2)
BUN: 18 mg/dL (ref 6–20)
CHLORIDE: 103 mmol/L (ref 101–111)
CO2: 19 mmol/L — ABNORMAL LOW (ref 22–32)
CREATININE: 1.66 mg/dL — AB (ref 0.61–1.24)
Calcium: 8.2 mg/dL — ABNORMAL LOW (ref 8.9–10.3)
GFR calc Af Amer: 45 mL/min — ABNORMAL LOW (ref >60)
GFR, EST NON AFRICAN AMERICAN: 39 mL/min — AB (ref >60)
Glucose, Bld: 103 mg/dL — ABNORMAL HIGH (ref 65–99)
Potassium: 4.5 mmol/L (ref 3.5–5.1)
Sodium: 134 mmol/L — ABNORMAL LOW (ref 135–145)
TOTAL PROTEIN: 5.3 g/dL — AB (ref 6.5–8.1)

## 2015-08-05 LAB — CBC
HCT: 32.5 % — ABNORMAL LOW (ref 39.0–52.0)
HEMATOCRIT: 32.7 % — AB (ref 39.0–52.0)
Hemoglobin: 10.2 g/dL — ABNORMAL LOW (ref 13.0–17.0)
Hemoglobin: 10.2 g/dL — ABNORMAL LOW (ref 13.0–17.0)
MCH: 29.3 pg (ref 26.0–34.0)
MCH: 29 pg (ref 26.0–34.0)
MCHC: 31.4 g/dL (ref 30.0–36.0)
MCHC: 31.2 g/dL (ref 30.0–36.0)
MCV: 93.4 fL (ref 78.0–100.0)
MCV: 92.9 fL (ref 78.0–100.0)
PLATELETS: 322 10*3/uL (ref 150–400)
Platelets: 189 10*3/uL (ref 150–400)
RBC: 3.48 MIL/uL — ABNORMAL LOW (ref 4.22–5.81)
RBC: 3.52 MIL/uL — AB (ref 4.22–5.81)
RDW: 15.2 % (ref 11.5–15.5)
RDW: 15.2 % (ref 11.5–15.5)
WBC: 9.7 10*3/uL (ref 4.0–10.5)
WBC: 7.8 10*3/uL (ref 4.0–10.5)

## 2015-08-05 LAB — BASIC METABOLIC PANEL
BUN: 18 mg/dL (ref 6–23)
CALCIUM: 9.3 mg/dL (ref 8.4–10.5)
CHLORIDE: 98 meq/L (ref 96–112)
CO2: 26 mEq/L (ref 19–32)
CREATININE: 1.04 mg/dL (ref 0.40–1.50)
GFR: 74.12 mL/min (ref >60.00)
Glucose, Bld: 101 mg/dL — ABNORMAL HIGH (ref 70–99)
Potassium: 3.9 mEq/L (ref 3.5–5.1)
Sodium: 135 mEq/L (ref 135–145)

## 2015-08-05 LAB — PROTIME-INR
INR: 1.15 (ref 0.00–1.49)
Prothrombin Time: 14.9 seconds (ref 11.6–15.2)

## 2015-08-05 LAB — PREPARE FRESH FROZEN PLASMA
UNIT DIVISION: 0
Unit division: 0

## 2015-08-05 LAB — TYPE AND SCREEN
ABO/RH(D): B POS
Antibody Screen: NEGATIVE
UNIT DIVISION: 0
Unit division: 0

## 2015-08-05 LAB — I-STAT TROPONIN, ED: Troponin i, poc: 0 ng/mL (ref 0.00–0.08)

## 2015-08-05 LAB — ETHANOL: Alcohol, Ethyl (B): 156 mg/dL — ABNORMAL HIGH (ref <5)

## 2015-08-05 LAB — CDS SEROLOGY

## 2015-08-05 LAB — I-STAT CG4 LACTIC ACID, ED: Lactic Acid, Venous: 5.62 mmol/L (ref 0.5–2.0)

## 2015-08-05 LAB — ABO/RH: ABO/RH(D): B POS

## 2015-08-05 MED ORDER — PANTOPRAZOLE SODIUM 40 MG PO TBEC
40.0000 mg | DELAYED_RELEASE_TABLET | Freq: Every day | ORAL | Status: DC
  Administered 2015-08-06 – 2015-08-09 (×4): 40 mg via ORAL
  Filled 2015-08-05 (×4): qty 1

## 2015-08-05 MED ORDER — ROSUVASTATIN CALCIUM 40 MG PO TABS
40.0000 mg | ORAL_TABLET | Freq: Every day | ORAL | Status: DC
  Administered 2015-08-06 – 2015-08-08 (×3): 40 mg via ORAL
  Filled 2015-08-05 (×3): qty 1
  Filled 2015-08-05: qty 2
  Filled 2015-08-05: qty 1
  Filled 2015-08-05 (×2): qty 2

## 2015-08-05 MED ORDER — THIAMINE HCL 100 MG/ML IJ SOLN: 100.0000 mg | Freq: Every day | INTRAMUSCULAR | Status: DC

## 2015-08-05 MED ORDER — ACETAMINOPHEN 325 MG PO TABS: 650.0000 mg | ORAL_TABLET | Freq: Four times a day (QID) | ORAL | Status: DC | PRN

## 2015-08-05 MED ORDER — VITAMIN B-1 100 MG PO TABS
100.0000 mg | ORAL_TABLET | Freq: Every day | ORAL | Status: DC
  Administered 2015-08-06 – 2015-08-09 (×4): 100 mg via ORAL
  Filled 2015-08-05 (×4): qty 1

## 2015-08-05 MED ORDER — SODIUM CHLORIDE 0.9 % IV SOLN
Freq: Once | INTRAVENOUS | Status: AC
  Administered 2015-08-05: 20:00:00 via INTRAVENOUS

## 2015-08-05 MED ORDER — POLYETHYLENE GLYCOL 3350 17 G PO PACK: 17.0000 g | PACK | Freq: Every day | ORAL | Status: DC | PRN

## 2015-08-05 MED ORDER — HEPARIN (PORCINE) IN NACL 100-0.45 UNIT/ML-% IJ SOLN
1350.0000 [IU]/h | INTRAMUSCULAR | Status: DC
  Administered 2015-08-05: 900 [IU]/h via INTRAVENOUS
  Administered 2015-08-06: 1350 [IU]/h via INTRAVENOUS
  Filled 2015-08-05 (×3): qty 250

## 2015-08-05 MED ORDER — FOLIC ACID 1 MG PO TABS
1.0000 mg | ORAL_TABLET | Freq: Every day | ORAL | Status: DC
  Administered 2015-08-06 – 2015-08-09 (×4): 1 mg via ORAL
  Filled 2015-08-05 (×4): qty 1

## 2015-08-05 MED ORDER — ADULT MULTIVITAMIN W/MINERALS CH
1.0000 | ORAL_TABLET | Freq: Every day | ORAL | Status: DC
  Administered 2015-08-06 – 2015-08-09 (×4): 1 via ORAL
  Filled 2015-08-05 (×4): qty 1

## 2015-08-05 MED ORDER — SODIUM CHLORIDE 0.9 % IV SOLN
INTRAVENOUS | Status: DC
  Administered 2015-08-06: 04:00:00 via INTRAVENOUS

## 2015-08-05 MED ORDER — IOPAMIDOL (ISOVUE-300) INJECTION 61%
INTRAVENOUS | Status: AC
  Administered 2015-08-05: 100 mL
  Filled 2015-08-05: qty 100

## 2015-08-05 MED ORDER — BISACODYL 5 MG PO TBEC: 5.0000 mg | DELAYED_RELEASE_TABLET | Freq: Every day | ORAL | Status: DC | PRN

## 2015-08-05 MED ORDER — HYDROCODONE-ACETAMINOPHEN 5-325 MG PO TABS
1.0000 | ORAL_TABLET | ORAL | Status: DC | PRN
  Administered 2015-08-06: 2 via ORAL
  Filled 2015-08-05: qty 2

## 2015-08-05 MED ORDER — ASPIRIN 325 MG PO TABS
325.0000 mg | ORAL_TABLET | Freq: Every day | ORAL | Status: DC
  Administered 2015-08-06: 325 mg via ORAL
  Filled 2015-08-05: qty 1

## 2015-08-05 MED ORDER — LORAZEPAM 2 MG/ML IJ SOLN: 1.0000 mg | Freq: Four times a day (QID) | INTRAMUSCULAR | Status: AC | PRN

## 2015-08-05 MED ORDER — SODIUM CHLORIDE 0.9% FLUSH
3.0000 mL | Freq: Two times a day (BID) | INTRAVENOUS | Status: DC
  Administered 2015-08-08 – 2015-08-09 (×3): 3 mL via INTRAVENOUS

## 2015-08-05 MED ORDER — ONDANSETRON HCL 4 MG/2ML IJ SOLN: 4.0000 mg | Freq: Four times a day (QID) | INTRAMUSCULAR | Status: DC | PRN

## 2015-08-05 MED ORDER — LIDOCAINE HCL (PF) 1 % IJ SOLN
10.0000 mL | Freq: Once | INTRAMUSCULAR | Status: AC
Start: 2015-08-05 — End: 2015-08-05
  Administered 2015-08-05: 10 mL via INTRADERMAL
  Filled 2015-08-05: qty 10

## 2015-08-05 MED ORDER — CYCLOBENZAPRINE HCL 10 MG PO TABS
10.0000 mg | ORAL_TABLET | Freq: Two times a day (BID) | ORAL | Status: DC
  Administered 2015-08-06 – 2015-08-09 (×7): 10 mg via ORAL
  Filled 2015-08-05 (×7): qty 1

## 2015-08-05 MED ORDER — CLOPIDOGREL BISULFATE 75 MG PO TABS
75.0000 mg | ORAL_TABLET | Freq: Every day | ORAL | Status: DC
  Administered 2015-08-06: 75 mg via ORAL
  Filled 2015-08-05: qty 1

## 2015-08-05 MED ORDER — CITALOPRAM HYDROBROMIDE 10 MG PO TABS
20.0000 mg | ORAL_TABLET | Freq: Every day | ORAL | Status: DC
  Administered 2015-08-06 – 2015-08-09 (×4): 20 mg via ORAL
  Filled 2015-08-05 (×4): qty 2

## 2015-08-05 MED ORDER — ACETAMINOPHEN 650 MG RE SUPP: 650.0000 mg | Freq: Four times a day (QID) | RECTAL | Status: DC | PRN

## 2015-08-05 MED ORDER — SODIUM CHLORIDE 0.9 % IV BOLUS (SEPSIS)
1000.0000 mL | Freq: Once | INTRAVENOUS | Status: AC
  Administered 2015-08-05: 1000 mL via INTRAVENOUS

## 2015-08-05 MED ORDER — ONDANSETRON HCL 4 MG PO TABS: 4.0000 mg | ORAL_TABLET | Freq: Four times a day (QID) | ORAL | Status: DC | PRN

## 2015-08-05 MED ORDER — LORAZEPAM 1 MG PO TABS: 1.0000 mg | ORAL_TABLET | Freq: Four times a day (QID) | ORAL | Status: AC | PRN

## 2015-08-05 NOTE — ED Notes (Signed)
Pt returned from CT °

## 2015-08-05 NOTE — Progress Notes (Signed)
Orthopedic Tech Progress Note Patient Details:  Timothy Wood May 07, 1940 [id number] Level 1 trauma ortho visit. Patient ID: Timothy Wood, male   DOB: 05-19-1940, 75 y.o.   MRN: [id number]   [name] 08/05/2015, 7:25 PM

## 2015-08-05 NOTE — H&P (Addendum)
History and Physical    DIVINE HANSLEY PPI:[id number] DOB: [birth year] DOA: 08/05/2015  PCP: [name], DO   Patient coming from: Home   Chief Complaint: Syncope with trauma   HPI: Timothy Wood is a 75 y.o. male with medical history significant for lung cancer status post recent resection, hypertension, CAD with stent, and GERD who presents to the hospital following an apparent syncopal episode at home with trauma to the right hand and head. Patient reports episodes of lightheadedness for approximately one year which had seemed to resolve for a while before returning over the past month. He saw his primary care physician today for evaluation of this, blood pressure was in the 90/50 range, and some of his antihypertensives were discontinued. Back at home this evening, patient was drinking alcohol and had prepared himself a shot, but the next thing he remembered was waking up on the floor with a lot of blood on his hand in face. He denies any preceding chest pain or palpitations. There were no witnesses. EMS was activated and upon their arrival found the blood pressure to be 62/40 with bleeding from the right hand and head. He was given 400 mL normal saline en route to the hospital.   ED Course: Upon arrival to the ED, patient is found to be afebrile, saturating adequately on room air, with blood pressure 72/43 and normal heart rate. Radiographs of the chest and right hand were negative for acute findings and CT head, neck, chest, abdomen, and pelvis was obtained. There were no acute intracranial or cervical spine abnormalities identified. There are acute compression fractures of L2 and L3 superior endplate. There is a nonocclusive right lower lobe PE as well as incidental notation of gastric wall thickening and an enlarged, globular prostate with associated thickening of the urinary bladder wall.There are bony lesions, including a mixed lytic/sclerotic lesion of the right iliac. CMP features a  serum creatinine 1.66 with no prior for comparison. CBC is notable for a hemoglobin of 10.2 with normal MCV, again, no prior for comparison. Troponin is undetectable and lactic acid is elevated to a value of 5.62. A bolus of 2 L normal saline was administered and the patient was started on IV heparin for treatment of his PE. Patient's  Blood pressure normalized after the fluid bolus and has remained hemodynamically stable. He'll be admitted to the hospital for ongoing evaluation and management of pulmonary embolism and hypotension with syncope.  Review of Systems:  All other systems reviewed and apart from HPI, are negative.  Past Medical History  Diagnosis Date  . Hypertension   . DVT (deep venous thrombosis) ([place])   . Hypercholesteremia   . Lung cancer [facility])     Past Surgical History  Procedure Laterality Date  . Coronary angioplasty with stent placement    . Lung biopsy    . Back surgery    . Tonsillectomy       reports that he has been smoking.  He does not have any smokeless tobacco history on file. He reports that he drinks alcohol. He reports that he does not use illicit drugs.  Allergies  Allergen Reactions  . Hydrocodone Itching    History reviewed. No pertinent family history.   Prior to Admission medications   Medication Sig Start Date End Date Taking? Authorizing Provider  amLODipine (NORVASC) 5 MG tablet Take 5 mg by mouth daily.   Yes Historical Provider, MD  aspirin 325 MG tablet Take 325 mg by mouth daily.  Yes Historical Provider, MD  citalopram (CELEXA) 20 MG tablet Take 20 mg by mouth daily.   Yes Historical Provider, MD  clopidogrel (PLAVIX) 75 MG tablet Take 75 mg by mouth daily.   Yes Historical Provider, MD  cyclobenzaprine (FLEXERIL) 10 MG tablet Take 10 mg by mouth 2 (two) times daily.   Yes Historical Provider, MD  irbesartan-hydrochlorothiazide (AVALIDE) 150-12.5 MG tablet Take 1 tablet by mouth daily.   Yes Historical Provider, MD  omeprazole  (PRILOSEC) 20 MG capsule Take 20 mg by mouth 2 (two) times daily before a meal.   Yes Historical Provider, MD  primidone (MYSOLINE) 50 MG tablet Take 50 mg by mouth at bedtime.   Yes Historical Provider, MD  rosuvastatin (CRESTOR) 40 MG tablet Take 40 mg by mouth daily.   Yes Historical Provider, MD    Physical Exam: Filed Vitals:   08/05/15 2030 08/05/15 2128 08/05/15 2145 08/05/15 2230  BP: 101/52 112/62 114/63 118/62  Pulse: 85 79 79 81  Temp:      Resp: '17 16 13 14  '$ Height:      Weight:      SpO2: 100% 99% 99% 100%      Constitutional: NAD, calm, comfortable. In c-collar, dried blood throughout hair  Eyes: PERTLA, lids and conjunctivae normal ENMT: Mucous membranes are moist. Posterior pharynx clear of any exudate or lesions.   Neck: normal, supple, no masses, no thyromegaly Respiratory: clear to auscultation bilaterally, no wheezing, no crackles. Normal respiratory effort.    Cardiovascular: S1 & S2 heard, regular rate and rhythm, no significant murmurs / rubs / gallops. No extremity edema. No significant JVD. Abdomen: No distension, no tenderness, no masses palpated. Bowel sounds normal.  Musculoskeletal: no clubbing / cyanosis. No joint deformity upper and lower extremities. Normal muscle tone.  Skin: Right palmar laceration well-approximated with sutures; superficial abrasion to scalp, no other significant rashes, lesions, ulcers. Warm, dry, well-perfused. Neurologic: CN 2-12 grossly intact. Sensation intact, DTR normal. Strength 5/5 in all 4 limbs.  Psychiatric: Normal judgment and insight. Alert and oriented x 3. Normal mood and affect.     Labs on Admission: I have personally reviewed following labs and imaging studies  CBC:  Recent Labs Lab 08/05/15 1934 08/05/15 2115  WBC 9.7 7.8  HGB 10.2* 10.2*  HCT 32.5* 32.7*  MCV 93.4 92.9  PLT 322 354   Basic Metabolic Panel:  Recent Labs Lab 08/05/15 1934  NA 134*  K 4.5  CL 103  CO2 19*  GLUCOSE 103*  BUN  18  CREATININE 1.66*  CALCIUM 8.2*   GFR: Estimated Creatinine Clearance: 35.1 mL/min (by C-G formula based on Cr of 1.66). Liver Function Tests:  Recent Labs Lab 08/05/15 1934  AST 28  ALT 15*  ALKPHOS 72  BILITOT 0.9  PROT 5.3*  ALBUMIN 3.2*   No results for input(s): LIPASE, AMYLASE in the last 168 hours. No results for input(s): AMMONIA in the last 168 hours. Coagulation Profile:  Recent Labs Lab 08/05/15 1934  INR 1.15   Cardiac Enzymes: No results for input(s): CKTOTAL, CKMB, CKMBINDEX, TROPONINI in the last 168 hours. BNP (last 3 results) No results for input(s): PROBNP in the last 8760 hours. HbA1C: No results for input(s): HGBA1C in the last 72 hours. CBG: No results for input(s): GLUCAP in the last 168 hours. Lipid Profile: No results for input(s): CHOL, HDL, LDLCALC, TRIG, CHOLHDL, LDLDIRECT in the last 72 hours. Thyroid Function Tests: No results for input(s): TSH, T4TOTAL, FREET4, T3FREE, THYROIDAB  in the last 72 hours. Anemia Panel: No results for input(s): VITAMINB12, FOLATE, FERRITIN, TIBC, IRON, RETICCTPCT in the last 72 hours. Urine analysis: No results found for: COLORURINE, APPEARANCEUR, LABSPEC, PHURINE, GLUCOSEU, HGBUR, BILIRUBINUR, KETONESUR, PROTEINUR, UROBILINOGEN, NITRITE, LEUKOCYTESUR Sepsis Labs: '@LABRCNTIP'$ (procalcitonin:4,lacticidven:4) )No results found for this or any previous visit (from the past 240 hour(s)).   Radiological Exams on Admission: Ct Head Wo Contrast  08/05/2015  CLINICAL DATA:  Level 2 trauma. Found with laceration to head and altered mental status. Right-sided head injury. History lung cancer. Deep venous thrombosis. Hypertension. EXAM: CT HEAD WITHOUT CONTRAST CT CERVICAL SPINE WITHOUT CONTRAST TECHNIQUE: Multidetector CT imaging of the head and cervical spine was performed following the standard protocol without intravenous contrast. Multiplanar CT image reconstructions of the cervical spine were also generated.  COMPARISON:  None. FINDINGS: CT HEAD FINDINGS Sinuses/Soft tissues: High right parietal scalp soft tissue swelling is mild to moderate. No skull fracture. Clear paranasal sinuses and mastoid air cells. Intracranial: No mass lesion, hemorrhage, hydrocephalus, acute infarct, intra-axial, or extra-axial fluid collection. CT CERVICAL SPINE FINDINGS Spinal visualization through the bottom of T1. Prevertebral soft tissues are within normal limits. No apical pneumothorax. Skull base intact. Expected for age cervical spondylosis with endplate osteophytes and loss of intervertebral disc height at C5-6. Facets are well-aligned. Degenerative irregularity surrounds the tip of the odontoid process, without donor site. IMPRESSION: 1. Right-sided scalp soft tissue swelling, without acute intracranial abnormality. 2. Cervical spondylosis, without acute fracture or subluxation. Electronically Signed   By: Abigail Miyamoto M.D.   On: 08/05/2015 20:43   Ct Chest W Contrast  08/05/2015  CLINICAL DATA:  Found with laceration to the head and loss of consciousness. History of lung cancer. EXAM: CT CHEST, ABDOMEN, AND PELVIS WITH CONTRAST TECHNIQUE: Multidetector CT imaging of the chest, abdomen and pelvis was performed following the standard protocol during bolus administration of intravenous contrast. CONTRAST:  18m ISOVUE-300 IOPAMIDOL (ISOVUE-300) INJECTION 61% COMPARISON:  None. FINDINGS: CT CHEST FINDINGS Mediastinum/Lymph Nodes: No masses, or pathologically enlarged lymph nodes. The heart is normal in size. There is calcified atherosclerotic disease of the coronary arteries. There is tortuosity of the aorta with significant calcified and noncalcified plaque throughout its course. There is a nonocclusive pulmonary embolus within right lower lobar pulmonary artery, extending into its segmental basilar branches. Lungs/Pleura: There is partially calcified posterior right upper lobe and middle lobe probably postsurgical scarring.  Musculoskeletal: No chest wall mass or suspicious bone lesions identified. CT ABDOMEN PELVIS FINDINGS Hepatobiliary: There are numerous hypoattenuated lesions throughout the liver, the larger of which measure water density. Pancreas: No mass, inflammatory changes, or other significant abnormality. Spleen: Within normal limits in size and appearance. Adrenals/Urinary Tract: No masses identified. No evidence of hydronephrosis. There are bilateral partially exophytic large renal cysts, measuring up to 5 cm in the left lower pole. Stomach/Bowel: There is diffuse thickening of the gastric wall. No evidence of small-bowel obstruction. Vascular/Lymphatic: No pathologically enlarged lymph nodes. No evidence of abdominal aortic aneurysm. Atherosclerotic disease with calcified and noncalcified plaque is noted. Reproductive: The prostate gland is enlarged and globular with diffuse thickening of the urinary bladder wall. Other: None. Musculoskeletal: There is an acute fracture of the superior endplates of L2 and L3 vertebral bodies with mild grade 1 compression deformity. There are subacute healing right-sided rib fractures. There is a mixed lytic and sclerotic appearing lesion within the right iliac bone measuring approximately 2.9 cm. There is a sclerotic appearance of the shaft of the left scapula. IMPRESSION: Nonocclusive right  lower lobar pulmonary embolus. Numerous hypoattenuated lesions throughout the liver, which measure water density, and by CT criteria represent cysts. Alternatively these may represent necrotic metastatic lesions. Simple appearing renal cysts. Diffuse gastric wall thickening. Direct visualization may be considered as malignancy cannot be excluded. Enlarged globular prostate gland with likely secondary diffuse urinary bladder wall thickening. Please correlate to serum PSA values. Acute superior endplate L2 and L3 vertebral body fractures with grade 1 compression deformities and no alignment  abnormality. Mixed lytic and sclerotic right iliac bone lesion ; metastatic disease cannot be excluded. Sclerotic appearance of the left scapula. Likely postsurgical scarring within the right upper and middle lobes of the lung. Advanced calcified and noncalcified atherosclerotic disease of the aorta. These results were called by telephone at the time of interpretation on 08/05/2015 at 9:05 pm to Dr. Tammy Sours , who verbally acknowledged these results. Electronically Signed   By: Fidela Salisbury M.D.   On: 08/05/2015 21:05   Ct Cervical Spine Wo Contrast  08/05/2015  CLINICAL DATA:  Level 2 trauma. Found with laceration to head and altered mental status. Right-sided head injury. History lung cancer. Deep venous thrombosis. Hypertension. EXAM: CT HEAD WITHOUT CONTRAST CT CERVICAL SPINE WITHOUT CONTRAST TECHNIQUE: Multidetector CT imaging of the head and cervical spine was performed following the standard protocol without intravenous contrast. Multiplanar CT image reconstructions of the cervical spine were also generated. COMPARISON:  None. FINDINGS: CT HEAD FINDINGS Sinuses/Soft tissues: High right parietal scalp soft tissue swelling is mild to moderate. No skull fracture. Clear paranasal sinuses and mastoid air cells. Intracranial: No mass lesion, hemorrhage, hydrocephalus, acute infarct, intra-axial, or extra-axial fluid collection. CT CERVICAL SPINE FINDINGS Spinal visualization through the bottom of T1. Prevertebral soft tissues are within normal limits. No apical pneumothorax. Skull base intact. Expected for age cervical spondylosis with endplate osteophytes and loss of intervertebral disc height at C5-6. Facets are well-aligned. Degenerative irregularity surrounds the tip of the odontoid process, without donor site. IMPRESSION: 1. Right-sided scalp soft tissue swelling, without acute intracranial abnormality. 2. Cervical spondylosis, without acute fracture or subluxation. Electronically Signed   By:  Abigail Miyamoto M.D.   On: 08/05/2015 20:43   Ct Abdomen Pelvis W Contrast  08/05/2015  CLINICAL DATA:  Found with laceration to the head and loss of consciousness. History of lung cancer. EXAM: CT CHEST, ABDOMEN, AND PELVIS WITH CONTRAST TECHNIQUE: Multidetector CT imaging of the chest, abdomen and pelvis was performed following the standard protocol during bolus administration of intravenous contrast. CONTRAST:  170m ISOVUE-300 IOPAMIDOL (ISOVUE-300) INJECTION 61% COMPARISON:  None. FINDINGS: CT CHEST FINDINGS Mediastinum/Lymph Nodes: No masses, or pathologically enlarged lymph nodes. The heart is normal in size. There is calcified atherosclerotic disease of the coronary arteries. There is tortuosity of the aorta with significant calcified and noncalcified plaque throughout its course. There is a nonocclusive pulmonary embolus within right lower lobar pulmonary artery, extending into its segmental basilar branches. Lungs/Pleura: There is partially calcified posterior right upper lobe and middle lobe probably postsurgical scarring. Musculoskeletal: No chest wall mass or suspicious bone lesions identified. CT ABDOMEN PELVIS FINDINGS Hepatobiliary: There are numerous hypoattenuated lesions throughout the liver, the larger of which measure water density. Pancreas: No mass, inflammatory changes, or other significant abnormality. Spleen: Within normal limits in size and appearance. Adrenals/Urinary Tract: No masses identified. No evidence of hydronephrosis. There are bilateral partially exophytic large renal cysts, measuring up to 5 cm in the left lower pole. Stomach/Bowel: There is diffuse thickening of the gastric wall. No evidence  of small-bowel obstruction. Vascular/Lymphatic: No pathologically enlarged lymph nodes. No evidence of abdominal aortic aneurysm. Atherosclerotic disease with calcified and noncalcified plaque is noted. Reproductive: The prostate gland is enlarged and globular with diffuse thickening of  the urinary bladder wall. Other: None. Musculoskeletal: There is an acute fracture of the superior endplates of L2 and L3 vertebral bodies with mild grade 1 compression deformity. There are subacute healing right-sided rib fractures. There is a mixed lytic and sclerotic appearing lesion within the right iliac bone measuring approximately 2.9 cm. There is a sclerotic appearance of the shaft of the left scapula. IMPRESSION: Nonocclusive right lower lobar pulmonary embolus. Numerous hypoattenuated lesions throughout the liver, which measure water density, and by CT criteria represent cysts. Alternatively these may represent necrotic metastatic lesions. Simple appearing renal cysts. Diffuse gastric wall thickening. Direct visualization may be considered as malignancy cannot be excluded. Enlarged globular prostate gland with likely secondary diffuse urinary bladder wall thickening. Please correlate to serum PSA values. Acute superior endplate L2 and L3 vertebral body fractures with grade 1 compression deformities and no alignment abnormality. Mixed lytic and sclerotic right iliac bone lesion ; metastatic disease cannot be excluded. Sclerotic appearance of the left scapula. Likely postsurgical scarring within the right upper and middle lobes of the lung. Advanced calcified and noncalcified atherosclerotic disease of the aorta. These results were called by telephone at the time of interpretation on 08/05/2015 at 9:05 pm to Dr. Tammy Sours , who verbally acknowledged these results. Electronically Signed   By: Fidela Salisbury M.D.   On: 08/05/2015 21:05   Dg Chest Portable 1 View  08/05/2015  CLINICAL DATA:  Status post level 2 trauma.  Initial encounter. EXAM: PORTABLE CHEST 1 VIEW COMPARISON:  None. FINDINGS: The lungs are well-aerated and clear. There is no evidence of focal opacification, pleural effusion or pneumothorax. Postoperative change is noted at the right midlung. The cardiomediastinal silhouette is  within normal limits. No acute osseous abnormalities are seen. IMPRESSION: No acute cardiopulmonary process seen. No displaced rib fractures identified. Electronically Signed   By: Garald Balding M.D.   On: 08/05/2015 19:48   Dg Hand Complete Right  08/05/2015  CLINICAL DATA:  Right hand laceration. EXAM: RIGHT HAND - COMPLETE 3+ VIEW COMPARISON:  None. FINDINGS: There is no evidence of fracture or dislocation. Narrowing and sclerosis of the first carpometacarpal joint is noted as well as the second and third distal interphalangeal joints. Soft tissues are unremarkable. IMPRESSION: Findings consistent with osteoarthritis. No acute abnormality or radiopaque foreign body seen in the right hand. Electronically Signed   By: Marijo Conception, M.D.   On: 08/05/2015 20:14    EKG: Independently reviewed. Sinus rhythm, VPCs, low-voltage QRS  Assessment/Plan  1. Pulmonary embolism  - Nonocclusive right lower lobe PE identified on CT - Treatment with heparin infusion initiated in the emergency department, will continue - Risk factor includes malignancy and smoking - Given his hypotension on admission, TTE ordered - Has remained hemodynamically stable following 2 L normal saline bolus - Saturating well on room air - Monitor on telemetry   2. Hypotension, with hx of hypertension  - BP reportedly 62/40 with EMS in the field, 72/43 on arrival to ED - Normalized following bolus of 2 L normal saline, has remained stable since - Unclear etiology; pt notes intermittent lightheadedness for ~1 year - Just had antihypertensives discontinued by PCP earlier on the day of admission  - No convincing evidence of infectious process - Monitor on telemetry, TTE ordered  3. Syncope  - Likely secondary to hypotension, though clinical picture complicated by acute alcohol intoxication - Follow up TTE, monitor on telemetry, IVF resuscitation   4. Alcohol intoxication  - EtOH level 156 on admission - Patient denies  any history of withdrawal symptoms - Monitor with CIWA  5. Kidney disease of unknown chronicity - Serum creatinine 1.66 on admission with no prior for comparison - Prerenal azotemia or ATN is certainly possible given his low blood pressures - Anticipate improvement with IV fluid resuscitation - Avoid nephrotoxins - Repeat chemistry panel in the morning   6. CAD - No anginal complaints; no ischemic changes on EKG - Troponin undetectable in the emergency department - Monitor on telemetry  7. Lung cancer  - Status post resection from the right lung ~6-8 weeks ago - Has not undergone any other treatment  8. Lumbar compression fractures - Superior endplate L2, L3 body fractures with grade 1 compression deformity - Neurosurgery was consulted by the EDP - Pain control as needed  9. Incidental CT findings  - Gastric wall thickening concerning for possible malignancy - Enlarged and globular prostate with diffuse urinary bladder wall thickening, concerning for malignancy - Lytic and sclerotic bony lesions   DVT prophylaxis: Treatment-dose heparin IV Code Status: Full  Family Communication: Discussed with patient  Disposition Plan: Observe on telemetry   Consults called: NSG consulted by EDP   Admission status: Observation     Vianne Bulls, MD [facility] Pager [phone]  If 7PM-7AM, please contact night-coverage [email or web address] Password [facility]  08/05/2015, 10:39 PM

## 2015-08-05 NOTE — ED Notes (Signed)
Contacted ortho for TLSO brace, brace to be placed in the morning

## 2015-08-05 NOTE — Progress Notes (Signed)
ANTICOAGULATION CONSULT NOTE - Initial Consult  Pharmacy Consult for heparin Indication: pulmonary embolus  No Known Allergies  Patient Measurements: Height: '5\' 9"'$  (175.3 cm) Weight: 140 lb (63.504 kg) IBW/kg (Calculated) : 70.7 Heparin Dosing Weight: 63.5kg  Vital Signs: Temp: 97.2 F (36.2 C) (05/22 1930) BP: 112/62 mmHg (05/22 2128) Pulse Rate: 79 (05/22 2128)  Labs:  Recent Labs  08/05/15 1934  HGB 10.2*  HCT 32.5*  PLT 322  LABPROT 14.9  INR 1.15  CREATININE 1.66*    Estimated Creatinine Clearance: 35.1 mL/min (by C-G formula based on Cr of 1.66).   Medical History: Past Medical History  Diagnosis Date  . Hypertension   . DVT (deep venous thrombosis) ([place])   . Hypercholesteremia   . Lung cancer ([place])     Medications:  Infusions:  . heparin      Assessment: 34 yom presented to the ED as a trauma after being found in a pool of blood s/p fall. He was found to have a PE and will be started on IV heparin. Per discussion with EDP, will not bolus and dose with a conservative goal d/t blood loss and injuries. Will require close monitoring. He is not on anticoagulation PTA except for plavix. Baseline H/H is slightly low and platelets are WNL. INR is WNL.   Goal of Therapy:  Heparin level 0.3-0.5 units/ml Monitor platelets by anticoagulation protocol: Yes   Plan:  - Heparin gtt 900 units/hr - Check an 8 hour heparin level - Daily heparin level and CBC *Monitor closely for S&S of bleeding   Gowri Suchan, Rande Lawman 08/05/2015,9:38 PM

## 2015-08-05 NOTE — Progress Notes (Signed)
HPI:  [name] is a 75 y.o. male, who is here today complaining of 3-4 weeks of intermittent dizziness.  Described as lightheaded /inbalance sensation. Duration a few minutes. Frequency a few times daily.  He has had prior episodes, Fall last year, resolved after increased food intake and wt gained.   Exacerbated by standing up and occasionally upon walking. Alleviated by sitting and bending head down.  Negative for associated headache, visual changes, chest pain,dyspnea, palpitation, or MS changes.  He has had 3 falls due to dizziness/unbalance, one with LOC about 3-4 weeks ago with face trauma.He states that woke up on his leaving room floor bleeding from nose. He did not seek medical attention then, denies any residual effect from fall except for aggravating lower back pain. Takes Flexeril as needed bid. On Plavix and Aspirin daily. No associated hearing loss,worsening tinnitus,recent URI or travel.   Chronic tinnitus, stable. Hx of high alcohol intake, still drinking but less, he does not give me specific about amount. Hx of tremor, following with neurologists. Denies any new medications.  Otherwise dizziness is stable.  Hx of HTN, currently he is on Amlodipine 5 mg and Avalide 150-12.5 mg daily, took both meds today. He eats about once daily and has done so for a couple years, poor appetite (unchanged); he thinks he drinks enough fluids.  BP at home lower 100's/50's.    Current Outpatient Prescriptions on File Prior to Visit  Medication Sig Dispense Refill  . amLODipine (NORVASC) 5 MG tablet TAKE 1 TABLET (5 MG TOTAL) BY MOUTH DAILY. 90 tablet 1  . aspirin 325 MG tablet Take 325 mg by mouth every morning.    . citalopram (CELEXA) 20 MG tablet TAKE 1 TABLET (20 MG TOTAL) BY MOUTH DAILY. 30 tablet 0  . clopidogrel (PLAVIX) 75 MG tablet TAKE 1 TABLET (75 MG TOTAL) BY MOUTH EVERY MORNING. 90 tablet 2  . CRESTOR 40 MG tablet TAKE 1 TABLET (40 MG TOTAL) BY  MOUTH DAILY. 30 tablet 1  . omeprazole (PRILOSEC) 20 MG capsule TAKE 1 CAPSULE (20 MG TOTAL) BY MOUTH 2 (TWO) TIMES DAILY BEFORE A MEAL. (Patient taking differently: Take 20 mg by mouth daily. TAKE 1 CAPSULE (20 MG TOTAL) BY MOUTH 2 (TWO) TIMES DAILY BEFORE A MEAL.) 180 capsule 1  . primidone (MYSOLINE) 50 MG tablet Take 1 tablet (50 mg total) by mouth at bedtime. 90 tablet 1   No current facility-administered medications on file prior to visit.      Past Medical History  Diagnosis Date  . Hypertension   . Tobacco abuse   . Pulmonary nodule 06/2006    9 mm right upper obe pulmonary nodule (negative Pet Scan)  . Personal history of colonic polyps 2008    multiple adenomas 2008-2009 exams with large cecal adenoma  . Hyperlipidemia   . PVD (peripheral vascular disease) ([place])     left leg s/p stent  . Seborrheic keratosis   . Sebaceous cyst     neck  . Sinusitis   . Blood clotting disorder ([place])   . Diverticulosis   . GERD (gastroesophageal reflux disease)   . Chronic obstructive pulmonary disease (COPD) ([place])     "patient not aware"  . Pneumonia     in history  . Depression   . Tremor   . DVT (deep venous thrombosis) ([place])     Post op  knee surgery  . Arthritis   . Histoplasmosis   . Adenocarcinoma of  lung, stage 1 ([facility])     Social History   Social History  . Marital Status: Married    Spouse Name: N/A  . Number of Children: 2  . Years of Education: N/A   Occupational History  . Retired    Social History Main Topics  . Smoking status: Current Every Day Smoker -- 0.75 packs/day for 60 years    Types: Cigarettes  . Smokeless tobacco: Never Used  . Alcohol Use: 0.0 oz/week    0 Standard drinks or equivalent per week     Comment: occasional  . Drug Use: No  . Sexual Activity: Not Currently   Other Topics Concern  . None   Social History Narrative    Married for over 50 years.  Wife died from lung cancer.     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     Occasional alcohol.      Tobacco use - over 50 pack years.   Father died at age 82 of suicide.   [facility]-daughter emergency contact     Drug Allergies:  Allergies  Allergen Reactions  . Hydrocodone Itching    ROS:  CONSTITUTIONAL: Denies fever, chills, fatigue, or changes in appetite (poor). He has not noted weight loss.  EYES:Denies conjunctival erythema, drainage, or visual changes.  EARS/NOSE/THROAT: Denies earache or drainage, nasal congestion, sinus pain, odynophagia, trouble swallowing, or oral lesions. Tinnitus: stable. Hearing loss: negative.  HEART:Denies chest pain, palpitations/irregular heart rate, exertional dyspnea, or edema.  LUNG: Denies cough, dyspnea, or wheezing.  ABDOMEN: Denies abdominal pain, changes in bowel habits, nausea, or vomiting. He has not noted blood in stool or melena.  GENITOURINARY: Denies dysuria, gross hematuria, changes in urinary frequency, or associated urine retention or incontinence.  SKIN: Denies new skin lesions/rash, ulcers,changes in skin, suspicious lesions.  MUSCLE/BONES: Denies associated myalgias. + Hx of back pain, exacerbated after fall. Denies neck pain, limitation ROM.  NERVOUS SYSTEM:Denies frequent/severe headaches, focal weakness, numbness, tingling,gait abnormalities, or worsening tremor.  MENTAL HEALTH: Hx of depression and anxiety, well controlled on medication. No unusual stress level, denies suicidal thoughts.    EXAM:  Filed Vitals:   08/05/15 1114 08/05/15 1202  BP: 100/62 110/60  Pulse: 88 84  Temp: 97.5 F (36.4 C)   Resp: 12    Body mass index is 21.3 kg/(m^2).   Wt Readings from Last 3 Encounters:  08/05/15 144 lb 4.8 oz (65.454 kg)  06/21/15 150 lb 8 oz (68.266 kg)  05/28/15 149 lb (67.586 kg)     CONSTITUTIONAL: vitals reviewed and listed above.  Well developed, well nourished, appears well hydrated and in no acute distress.  HEAD: Atraumatic. Face: no  deformities appreciated.   EYE: conjunttiva clear, no drainage, PERRLA, EOMI.  ENT:  Oral mucosa is mildly dry, no oropharyngeal erythema or oral lesions. No sinus tenderness.  NECK: no obvious masses on inspection, no lymphadenopathy or thyromegaly appreciated. No carotid bruit appreciated bilateral.  LUNGS: clear to auscultation bilaterally, no wheezes, rales or rhonchi.  CARDIOVASCULAR: Regular rate and rhythm, no murmur appreciated, pitting trace of peri-ankle-pedal edema bilateral. DP and PT present bilateral. BP lying down: 110/60, P: 84/min. Sitting BP 90/70 P: 96/min. Standing up BP 85/60 P: 96/min.  ABDOMEN: No tenderness upon palpation, soft, no masses or megaly appreciated.  NEURO: Alert and oriented x 3, CN II-XII grossly intact, DTR's 1-2+ symmetric bilateral on 4 extremities, overall no motor or sensorial deficit appreciated, stable gait with no  assistance.  PSYCH: pleasant and cooperative, no obvious depression or anxiety.Good eye contact.  SKIN: Ecchymosis on chin, no tenderness, no edema appreciated.    ASSESSMENT AND PLAN:    Hilary was seen today for dizziness.  Diagnoses and all orders for this visit:  Dizziness and giddiness We discussed possible causes, fall precautions discussed. Today we will check basic metabolic panel.       Recommended adequate hydration and a healthy diet: 3 meals per day. Noted some wt loss since his last OV, will continue                   following. Avoid alcohol intake. Some side effects of his medications discussed. Clearly instructed about warning signs.  -     Basic metabolic panel  Syncope and collapse  1 episode with no residual complications. Seems to be related to hypotension.  At this point I don't think brain imaging or CV work-up is needed but needs to be considered if he has more episodes.  -     TSH  Orthostatic hypotension Adequate hydration. Antihypertensive medications re-adjusted. Instructed about  warning signs, he voices understanding. F/U in 3 weeks.   Essential hypertension Avalide discontinued. No changes in Amlodipine for now. Monitor BP at home, goal < 150/90. F/U in 3 weeks.     -Patient advised to return or notify a doctor immediately if symptoms worsen or persist or new concerns arise.    Cobie Leidner G. [name], MD  [facility]. [place] office.

## 2015-08-05 NOTE — Progress Notes (Signed)
Sitting BP 90/70 P: 96/min. Standing up BP 85/60 P: 96/min

## 2015-08-05 NOTE — Patient Instructions (Signed)
A few things to remember from today's visit:   1. Syncope and collapse   2. Orthostatic hypotension  Cautions with falls. Monitor for new symptoms. Stop Avalide. No changes in Amlodipine. Monitor BP at hooe, goal < 150/90.  Please schedule follow up before leaving.      If you sign-up for My chart, you can communicate easier with [place] in case you have any question or concern.

## 2015-08-05 NOTE — ED Notes (Signed)
R hand laceration cleansed with saline, bleeding controlled

## 2015-08-05 NOTE — ED Provider Notes (Signed)
CSN: [id number]     Arrival date & time 08/05/15  1916 History   First MD Initiated Contact with Patient 08/05/15 1927     Chief Complaint  Patient presents with  . Trauma    HPI  Level I trauma called on arrival to hypotension the field. Per EMS was found down on the ground surrounded in a pool of blood. Positive history of LOC. He states he was prepared a shot of brandy and first thing he remembered was he was on the ground. He thinks he struck his head. Positive headache. + right hand pain.  He denies abdominal pain, nausea, vomiting, hematemesis. He is on Plavix and aspirin. Denies neck or back pain. 400 mL of IV of normal saline given by EMS prior to arrival. Initial blood pressure 62/40. He has been mentating well, laughing, joking with providers. Tetanus updated within last 5 years per patient. No chest pain.  He was seen at PCP office today for lighthead at that time was noted to be 90/60. The patient noted to have blood pressure medications which was discontinued at that time.edness recurrent falls. Blood pressure.  Past Medical History  Diagnosis Date  . Hypertension   . DVT (deep venous thrombosis) ([place])   . Hypercholesteremia   . Lung cancer [facility])    Past Surgical History  Procedure Laterality Date  . Coronary angioplasty with stent placement    . Lung biopsy    . Back surgery    . Tonsillectomy     History reviewed. No pertinent family history. Social History  Substance Use Topics  . Smoking status: Current Every Day Smoker  . Smokeless tobacco: None  . Alcohol Use: Yes    Review of Systems  Constitutional: Negative for fever and chills.  Respiratory: Negative for cough, shortness of breath and wheezing.   Cardiovascular: Negative for chest pain.  Gastrointestinal: Negative for nausea, vomiting and abdominal pain.  Musculoskeletal: Positive for arthralgias. Negative for back pain.  Skin: Positive for wound. Negative for rash.  Neurological: Positive for syncope  and headaches.  Hematological: Bruises/bleeds easily.  Psychiatric/Behavioral: Positive for confusion.  All other systems reviewed and are negative.   Allergies  Hydrocodone  Home Medications   Prior to Admission medications   Medication Sig Start Date End Date Taking? Authorizing Provider  amLODipine (NORVASC) 5 MG tablet Take 5 mg by mouth daily.   Yes Historical Provider, MD  aspirin 325 MG tablet Take 325 mg by mouth daily.   Yes Historical Provider, MD  citalopram (CELEXA) 20 MG tablet Take 20 mg by mouth daily.   Yes Historical Provider, MD  clopidogrel (PLAVIX) 75 MG tablet Take 75 mg by mouth daily.   Yes Historical Provider, MD  cyclobenzaprine (FLEXERIL) 10 MG tablet Take 10 mg by mouth 2 (two) times daily.   Yes Historical Provider, MD  irbesartan-hydrochlorothiazide (AVALIDE) 150-12.5 MG tablet Take 1 tablet by mouth daily.   Yes Historical Provider, MD  omeprazole (PRILOSEC) 20 MG capsule Take 20 mg by mouth 2 (two) times daily before a meal.   Yes Historical Provider, MD  primidone (MYSOLINE) 50 MG tablet Take 50 mg by mouth at bedtime.   Yes Historical Provider, MD  rosuvastatin (CRESTOR) 40 MG tablet Take 40 mg by mouth daily.   Yes Historical Provider, MD   BP 118/62 mmHg  Pulse 81  Temp(Src) 97.2 F (36.2 C)  Resp 14  Ht '5\' 9"'$  (1.753 m)  Wt 63.504 kg  BMI 20.67 kg/m2  SpO2  100% Physical Exam  Constitutional: He is oriented to person, place, and time. He appears well-developed and well-nourished. No distress (right parietal skull with deformity/ irrgularity.  hemostatic. +h ematoma. midface stable).  Elderly, GC 77, joking with staff  HENT:  Head: Normocephalic and atraumatic.  Nose: Nose normal.  Eyes: Conjunctivae are normal.  EOMI. No conj pallor  Neck: Normal range of motion. Neck supple. No tracheal deviation present.  collared  Cardiovascular: Normal rate, regular rhythm and normal heart sounds.   No murmur heard. Cap refill delayed.  Weak pulses   Pulmonary/Chest: Effort normal and breath sounds normal. No respiratory distress. He has no rales.  Abdominal: Soft. Bowel sounds are normal. He exhibits no distension and no mass. There is no tenderness.  Right flank ecchymoses  Musculoskeletal: Normal range of motion. He exhibits no edema.  FROM of all extremities.  FROM of right hand with flexion and extension of fingers  Neurological: He is alert and oriented to person, place, and time.  CN 3-12 normal.   SENSORY: sensation is intact to light touch in:  superficial radial nerve distribution (dorsal first web space) median nerve distribution (tip of index finger)   ulnar nerve distribution (tip of small finger)     MOTOR:  + motor posterior interosseous nerve (thumb IP extension) + anterior interosseous nerve (thumb IP flexion, index finger DIP flexion) + radial nerve (wrist extension) + median nerve (palpable firing thenar mass) + ulnar nerve (palpable firing of first dorsal interosseous muscle)   Skin: Skin is warm and dry. No rash noted.  Linear laceration across volar aspect of right palm down through dermis.  No muscle exposed.  No tendons exposed.  Hemostatic.    Psychiatric: He has a normal mood and affect.  Nursing note and vitals reviewed.   ED Course  [name] KitchenLaceration Repair Date/Time: 08/05/2015 10:17 PM Performed by: Tammy Sours Authorized by: Tammy Sours Consent: Verbal consent obtained. Risks and benefits: risks, benefits and alternatives were discussed Consent given by: patient Required items: required blood products, implants, devices, and special equipment available Time out: Immediately prior to procedure a "time out" was called to verify the correct patient, procedure, equipment, support staff and site/side marked as required. Location: right hand. Laceration length: 7 cm Foreign bodies: no foreign bodies Tendon involvement: none Nerve involvement: none Vascular damage: no Anesthesia: local  infiltration Local anesthetic: lidocaine 1% with epinephrine Anesthetic total: 4 ml Patient sedated: no Preparation: Patient was prepped and draped in the usual sterile fashion. Irrigation solution: saline Irrigation method: syringe Amount of cleaning: standard Debridement: none Degree of undermining: none Skin closure: 5-0 Prolene Number of sutures: 10 Technique: simple Approximation: close Approximation difficulty: simple Patient tolerance: Patient tolerated the procedure well with no immediate complications   (including critical care time) Labs Review Labs Reviewed  COMPREHENSIVE METABOLIC PANEL - Abnormal; Notable for the following:    Sodium 134 (*)    CO2 19 (*)    Glucose, Bld 103 (*)    Creatinine, Ser 1.66 (*)    Calcium 8.2 (*)    Total Protein 5.3 (*)    Albumin 3.2 (*)    ALT 15 (*)    GFR calc non Af Amer 39 (*)    GFR calc Af Amer 45 (*)    All other components within normal limits  CBC - Abnormal; Notable for the following:    RBC 3.48 (*)    Hemoglobin 10.2 (*)    HCT 32.5 (*)    All other  components within normal limits  ETHANOL - Abnormal; Notable for the following:    Alcohol, Ethyl (B) 156 (*)    All other components within normal limits  CBC - Abnormal; Notable for the following:    RBC 3.52 (*)    Hemoglobin 10.2 (*)    HCT 32.7 (*)    All other components within normal limits  I-STAT CG4 LACTIC ACID, ED - Abnormal; Notable for the following:    Lactic Acid, Venous 5.62 (*)    All other components within normal limits  URINE CULTURE  CDS SEROLOGY  PROTIME-INR  URINALYSIS, ROUTINE W REFLEX MICROSCOPIC (NOT AT [facility])  HEPARIN LEVEL (UNFRACTIONATED)  CBC  BASIC METABOLIC PANEL  HEMOGLOBIN  HEMOGLOBIN  HEMATOCRIT  APTT  LACTIC ACID, PLASMA  LACTIC ACID, PLASMA  PROCALCITONIN  I-STAT CHEM 8, ED  I-STAT TROPOININ, ED  TYPE AND SCREEN  PREPARE FRESH FROZEN PLASMA  ABO/RH    Imaging Review Ct Head Wo Contrast  08/05/2015  CLINICAL  DATA:  Level 2 trauma. Found with laceration to head and altered mental status. Right-sided head injury. History lung cancer. Deep venous thrombosis. Hypertension. EXAM: CT HEAD WITHOUT CONTRAST CT CERVICAL SPINE WITHOUT CONTRAST TECHNIQUE: Multidetector CT imaging of the head and cervical spine was performed following the standard protocol without intravenous contrast. Multiplanar CT image reconstructions of the cervical spine were also generated. COMPARISON:  None. FINDINGS: CT HEAD FINDINGS Sinuses/Soft tissues: High right parietal scalp soft tissue swelling is mild to moderate. No skull fracture. Clear paranasal sinuses and mastoid air cells. Intracranial: No mass lesion, hemorrhage, hydrocephalus, acute infarct, intra-axial, or extra-axial fluid collection. CT CERVICAL SPINE FINDINGS Spinal visualization through the bottom of T1. Prevertebral soft tissues are within normal limits. No apical pneumothorax. Skull base intact. Expected for age cervical spondylosis with endplate osteophytes and loss of intervertebral disc height at C5-6. Facets are well-aligned. Degenerative irregularity surrounds the tip of the odontoid process, without donor site. IMPRESSION: 1. Right-sided scalp soft tissue swelling, without acute intracranial abnormality. 2. Cervical spondylosis, without acute fracture or subluxation. Electronically Signed   By: Abigail Miyamoto M.D.   On: 08/05/2015 20:43   Ct Chest W Contrast  08/05/2015  CLINICAL DATA:  Found with laceration to the head and loss of consciousness. History of lung cancer. EXAM: CT CHEST, ABDOMEN, AND PELVIS WITH CONTRAST TECHNIQUE: Multidetector CT imaging of the chest, abdomen and pelvis was performed following the standard protocol during bolus administration of intravenous contrast. CONTRAST:  147m ISOVUE-300 IOPAMIDOL (ISOVUE-300) INJECTION 61% COMPARISON:  None. FINDINGS: CT CHEST FINDINGS Mediastinum/Lymph Nodes: No masses, or pathologically enlarged lymph nodes. The  heart is normal in size. There is calcified atherosclerotic disease of the coronary arteries. There is tortuosity of the aorta with significant calcified and noncalcified plaque throughout its course. There is a nonocclusive pulmonary embolus within right lower lobar pulmonary artery, extending into its segmental basilar branches. Lungs/Pleura: There is partially calcified posterior right upper lobe and middle lobe probably postsurgical scarring. Musculoskeletal: No chest wall mass or suspicious bone lesions identified. CT ABDOMEN PELVIS FINDINGS Hepatobiliary: There are numerous hypoattenuated lesions throughout the liver, the larger of which measure water density. Pancreas: No mass, inflammatory changes, or other significant abnormality. Spleen: Within normal limits in size and appearance. Adrenals/Urinary Tract: No masses identified. No evidence of hydronephrosis. There are bilateral partially exophytic large renal cysts, measuring up to 5 cm in the left lower pole. Stomach/Bowel: There is diffuse thickening of the gastric wall. No evidence of small-bowel obstruction. Vascular/Lymphatic:  No pathologically enlarged lymph nodes. No evidence of abdominal aortic aneurysm. Atherosclerotic disease with calcified and noncalcified plaque is noted. Reproductive: The prostate gland is enlarged and globular with diffuse thickening of the urinary bladder wall. Other: None. Musculoskeletal: There is an acute fracture of the superior endplates of L2 and L3 vertebral bodies with mild grade 1 compression deformity. There are subacute healing right-sided rib fractures. There is a mixed lytic and sclerotic appearing lesion within the right iliac bone measuring approximately 2.9 cm. There is a sclerotic appearance of the shaft of the left scapula. IMPRESSION: Nonocclusive right lower lobar pulmonary embolus. Numerous hypoattenuated lesions throughout the liver, which measure water density, and by CT criteria represent cysts.  Alternatively these may represent necrotic metastatic lesions. Simple appearing renal cysts. Diffuse gastric wall thickening. Direct visualization may be considered as malignancy cannot be excluded. Enlarged globular prostate gland with likely secondary diffuse urinary bladder wall thickening. Please correlate to serum PSA values. Acute superior endplate L2 and L3 vertebral body fractures with grade 1 compression deformities and no alignment abnormality. Mixed lytic and sclerotic right iliac bone lesion ; metastatic disease cannot be excluded. Sclerotic appearance of the left scapula. Likely postsurgical scarring within the right upper and middle lobes of the lung. Advanced calcified and noncalcified atherosclerotic disease of the aorta. These results were called by telephone at the time of interpretation on 08/05/2015 at 9:05 pm to Dr. Tammy Sours , who verbally acknowledged these results. Electronically Signed   By: Fidela Salisbury M.D.   On: 08/05/2015 21:05   Ct Cervical Spine Wo Contrast  08/05/2015  CLINICAL DATA:  Level 2 trauma. Found with laceration to head and altered mental status. Right-sided head injury. History lung cancer. Deep venous thrombosis. Hypertension. EXAM: CT HEAD WITHOUT CONTRAST CT CERVICAL SPINE WITHOUT CONTRAST TECHNIQUE: Multidetector CT imaging of the head and cervical spine was performed following the standard protocol without intravenous contrast. Multiplanar CT image reconstructions of the cervical spine were also generated. COMPARISON:  None. FINDINGS: CT HEAD FINDINGS Sinuses/Soft tissues: High right parietal scalp soft tissue swelling is mild to moderate. No skull fracture. Clear paranasal sinuses and mastoid air cells. Intracranial: No mass lesion, hemorrhage, hydrocephalus, acute infarct, intra-axial, or extra-axial fluid collection. CT CERVICAL SPINE FINDINGS Spinal visualization through the bottom of T1. Prevertebral soft tissues are within normal limits. No apical  pneumothorax. Skull base intact. Expected for age cervical spondylosis with endplate osteophytes and loss of intervertebral disc height at C5-6. Facets are well-aligned. Degenerative irregularity surrounds the tip of the odontoid process, without donor site. IMPRESSION: 1. Right-sided scalp soft tissue swelling, without acute intracranial abnormality. 2. Cervical spondylosis, without acute fracture or subluxation. Electronically Signed   By: Abigail Miyamoto M.D.   On: 08/05/2015 20:43   Ct Abdomen Pelvis W Contrast  08/05/2015  CLINICAL DATA:  Found with laceration to the head and loss of consciousness. History of lung cancer. EXAM: CT CHEST, ABDOMEN, AND PELVIS WITH CONTRAST TECHNIQUE: Multidetector CT imaging of the chest, abdomen and pelvis was performed following the standard protocol during bolus administration of intravenous contrast. CONTRAST:  183m ISOVUE-300 IOPAMIDOL (ISOVUE-300) INJECTION 61% COMPARISON:  None. FINDINGS: CT CHEST FINDINGS Mediastinum/Lymph Nodes: No masses, or pathologically enlarged lymph nodes. The heart is normal in size. There is calcified atherosclerotic disease of the coronary arteries. There is tortuosity of the aorta with significant calcified and noncalcified plaque throughout its course. There is a nonocclusive pulmonary embolus within right lower lobar pulmonary artery, extending into its segmental basilar branches.  Lungs/Pleura: There is partially calcified posterior right upper lobe and middle lobe probably postsurgical scarring. Musculoskeletal: No chest wall mass or suspicious bone lesions identified. CT ABDOMEN PELVIS FINDINGS Hepatobiliary: There are numerous hypoattenuated lesions throughout the liver, the larger of which measure water density. Pancreas: No mass, inflammatory changes, or other significant abnormality. Spleen: Within normal limits in size and appearance. Adrenals/Urinary Tract: No masses identified. No evidence of hydronephrosis. There are bilateral  partially exophytic large renal cysts, measuring up to 5 cm in the left lower pole. Stomach/Bowel: There is diffuse thickening of the gastric wall. No evidence of small-bowel obstruction. Vascular/Lymphatic: No pathologically enlarged lymph nodes. No evidence of abdominal aortic aneurysm. Atherosclerotic disease with calcified and noncalcified plaque is noted. Reproductive: The prostate gland is enlarged and globular with diffuse thickening of the urinary bladder wall. Other: None. Musculoskeletal: There is an acute fracture of the superior endplates of L2 and L3 vertebral bodies with mild grade 1 compression deformity. There are subacute healing right-sided rib fractures. There is a mixed lytic and sclerotic appearing lesion within the right iliac bone measuring approximately 2.9 cm. There is a sclerotic appearance of the shaft of the left scapula. IMPRESSION: Nonocclusive right lower lobar pulmonary embolus. Numerous hypoattenuated lesions throughout the liver, which measure water density, and by CT criteria represent cysts. Alternatively these may represent necrotic metastatic lesions. Simple appearing renal cysts. Diffuse gastric wall thickening. Direct visualization may be considered as malignancy cannot be excluded. Enlarged globular prostate gland with likely secondary diffuse urinary bladder wall thickening. Please correlate to serum PSA values. Acute superior endplate L2 and L3 vertebral body fractures with grade 1 compression deformities and no alignment abnormality. Mixed lytic and sclerotic right iliac bone lesion ; metastatic disease cannot be excluded. Sclerotic appearance of the left scapula. Likely postsurgical scarring within the right upper and middle lobes of the lung. Advanced calcified and noncalcified atherosclerotic disease of the aorta. These results were called by telephone at the time of interpretation on 08/05/2015 at 9:05 pm to Dr. Tammy Sours , who verbally acknowledged these results.  Electronically Signed   By: Fidela Salisbury M.D.   On: 08/05/2015 21:05   Dg Chest Portable 1 View  08/05/2015  CLINICAL DATA:  Status post level 2 trauma.  Initial encounter. EXAM: PORTABLE CHEST 1 VIEW COMPARISON:  None. FINDINGS: The lungs are well-aerated and clear. There is no evidence of focal opacification, pleural effusion or pneumothorax. Postoperative change is noted at the right midlung. The cardiomediastinal silhouette is within normal limits. No acute osseous abnormalities are seen. IMPRESSION: No acute cardiopulmonary process seen. No displaced rib fractures identified. Electronically Signed   By: Garald Balding M.D.   On: 08/05/2015 19:48   Dg Hand Complete Right  08/05/2015  CLINICAL DATA:  Right hand laceration. EXAM: RIGHT HAND - COMPLETE 3+ VIEW COMPARISON:  None. FINDINGS: There is no evidence of fracture or dislocation. Narrowing and sclerosis of the first carpometacarpal joint is noted as well as the second and third distal interphalangeal joints. Soft tissues are unremarkable. IMPRESSION: Findings consistent with osteoarthritis. No acute abnormality or radiopaque foreign body seen in the right hand. Electronically Signed   By: Marijo Conception, M.D.   On: 08/05/2015 20:14   I have personally reviewed and evaluated these images and lab results as part of my medical decision-making.   EKG Interpretation   Date/Time:  Monday Aug 05 2015 19:23:33 EDT Ventricular Rate:  76 PR Interval:  149 QRS Duration: 96 QT Interval:  433  QTC Calculation: 487 R Axis:   19 Text Interpretation:  Sinus rhythm Ventricular premature complex  Borderline low voltage, extremity leads Borderline prolonged QT interval  No previous ECGs available Confirmed by YAO  MD, DAVID ([id number]) on  08/05/2015 7:28:23 PM      MDM   Final diagnoses:  Lumbar compression fracture, closed, initial encounter ([place])  Trauma  Hypotension, unspecified hypotension type  Hand laceration, right, initial encounter   Alcohol use ([facility])  Anemia, unspecified anemia type    EMERGENCY DEPARTMENT [place] FAST EXAM  INDICATIONS:Hypotension  PERFORMED BY: Myself  IMAGES ARCHIVED?: No  FINDINGS: All views negative  LIMITATIONS:  Emergent procedure  INTERPRETATION:  No abdominal free fluid and No pericardial effusion  Level on trauma call prior to arrival. Trauma surgery at bedside. Initially hypotensive improved with IV fluids. GCS 15. Airway intact, by low breath sounds.  Fast negative as detailed above.Chest x-ray without evidence of pneumothorax. No external bleeding noted on initial exam. Laceration to right hand noted, does not involve flexor tendons. Hematoma to the right parietal occiput. CT head without intracranial injury or skull fx. Positive alcohol use. Trauma scans demonstrate lumbar compression fracture 2. Neurovascular intact. He did have a fall 2 weeks. As likely source. Right flank ecchymosis lady from prior fall. CT abdomen and pelvis without intracranial abdominal traumatic pathology however there is multiple lesions concerning for neoplastic disease. Spoke to neurosurgery regarding lumbar fractures planned for TLSO, upright images, consult to follow. Cleared for heparin for right sided PE. Will trend hemoglobin, repeat stable so far.  Lac repaired. Tetanus UTD.   Admit for further medical management.     Tammy Sours, MD 08/05/15 [id number]  Tammy Sours, MD 08/05/15 1962  Wandra Arthurs, MD 08/07/15 2023

## 2015-08-05 NOTE — ED Notes (Signed)
Pt taken to CT.

## 2015-08-06 ENCOUNTER — Observation Stay (HOSPITAL_BASED_OUTPATIENT_CLINIC_OR_DEPARTMENT_OTHER): Payer: Medicare HMO

## 2015-08-06 ENCOUNTER — Encounter (HOSPITAL_COMMUNITY): Payer: Self-pay | Admitting: *Deleted

## 2015-08-06 DIAGNOSIS — S0990XA Unspecified injury of head, initial encounter: Secondary | ICD-10-CM | POA: Diagnosis not present

## 2015-08-06 DIAGNOSIS — S0191XD Laceration without foreign body of unspecified part of head, subsequent encounter: Secondary | ICD-10-CM

## 2015-08-06 DIAGNOSIS — Z86718 Personal history of other venous thrombosis and embolism: Secondary | ICD-10-CM | POA: Diagnosis not present

## 2015-08-06 DIAGNOSIS — I2510 Atherosclerotic heart disease of native coronary artery without angina pectoris: Secondary | ICD-10-CM | POA: Diagnosis present

## 2015-08-06 DIAGNOSIS — S32030A Wedge compression fracture of third lumbar vertebra, initial encounter for closed fracture: Secondary | ICD-10-CM | POA: Diagnosis not present

## 2015-08-06 DIAGNOSIS — E860 Dehydration: Secondary | ICD-10-CM | POA: Diagnosis not present

## 2015-08-06 DIAGNOSIS — N400 Enlarged prostate without lower urinary tract symptoms: Secondary | ICD-10-CM | POA: Diagnosis not present

## 2015-08-06 DIAGNOSIS — Z7289 Other problems related to lifestyle: Secondary | ICD-10-CM | POA: Insufficient documentation

## 2015-08-06 DIAGNOSIS — F10129 Alcohol abuse with intoxication, unspecified: Secondary | ICD-10-CM | POA: Diagnosis not present

## 2015-08-06 DIAGNOSIS — I10 Essential (primary) hypertension: Secondary | ICD-10-CM | POA: Diagnosis present

## 2015-08-06 DIAGNOSIS — G250 Essential tremor: Secondary | ICD-10-CM | POA: Diagnosis present

## 2015-08-06 DIAGNOSIS — E7800 Pure hypercholesterolemia, unspecified: Secondary | ICD-10-CM | POA: Diagnosis present

## 2015-08-06 DIAGNOSIS — F17200 Nicotine dependence, unspecified, uncomplicated: Secondary | ICD-10-CM | POA: Diagnosis present

## 2015-08-06 DIAGNOSIS — R4182 Altered mental status, unspecified: Secondary | ICD-10-CM | POA: Diagnosis present

## 2015-08-06 DIAGNOSIS — R69 Illness, unspecified: Secondary | ICD-10-CM | POA: Diagnosis not present

## 2015-08-06 DIAGNOSIS — I959 Hypotension, unspecified: Secondary | ICD-10-CM | POA: Diagnosis not present

## 2015-08-06 DIAGNOSIS — D62 Acute posthemorrhagic anemia: Secondary | ICD-10-CM | POA: Diagnosis not present

## 2015-08-06 DIAGNOSIS — R55 Syncope and collapse: Secondary | ICD-10-CM

## 2015-08-06 DIAGNOSIS — Y906 Blood alcohol level of 120-199 mg/100 ml: Secondary | ICD-10-CM | POA: Diagnosis present

## 2015-08-06 DIAGNOSIS — N179 Acute kidney failure, unspecified: Secondary | ICD-10-CM | POA: Diagnosis not present

## 2015-08-06 DIAGNOSIS — S61411A Laceration without foreign body of right hand, initial encounter: Secondary | ICD-10-CM | POA: Diagnosis not present

## 2015-08-06 DIAGNOSIS — K219 Gastro-esophageal reflux disease without esophagitis: Secondary | ICD-10-CM | POA: Diagnosis present

## 2015-08-06 DIAGNOSIS — S32039A Unspecified fracture of third lumbar vertebra, initial encounter for closed fracture: Secondary | ICD-10-CM | POA: Diagnosis not present

## 2015-08-06 DIAGNOSIS — S32000D Wedge compression fracture of unspecified lumbar vertebra, subsequent encounter for fracture with routine healing: Secondary | ICD-10-CM | POA: Diagnosis not present

## 2015-08-06 DIAGNOSIS — Z955 Presence of coronary angioplasty implant and graft: Secondary | ICD-10-CM | POA: Diagnosis not present

## 2015-08-06 DIAGNOSIS — Z79899 Other long term (current) drug therapy: Secondary | ICD-10-CM | POA: Diagnosis not present

## 2015-08-06 DIAGNOSIS — S61419A Laceration without foreign body of unspecified hand, initial encounter: Secondary | ICD-10-CM | POA: Insufficient documentation

## 2015-08-06 DIAGNOSIS — E876 Hypokalemia: Secondary | ICD-10-CM | POA: Diagnosis not present

## 2015-08-06 DIAGNOSIS — E162 Hypoglycemia, unspecified: Secondary | ICD-10-CM | POA: Diagnosis present

## 2015-08-06 DIAGNOSIS — Z7902 Long term (current) use of antithrombotics/antiplatelets: Secondary | ICD-10-CM | POA: Diagnosis not present

## 2015-08-06 DIAGNOSIS — S61411D Laceration without foreign body of right hand, subsequent encounter: Secondary | ICD-10-CM | POA: Diagnosis not present

## 2015-08-06 DIAGNOSIS — S32029A Unspecified fracture of second lumbar vertebra, initial encounter for closed fracture: Secondary | ICD-10-CM | POA: Diagnosis not present

## 2015-08-06 DIAGNOSIS — S32000G Wedge compression fracture of unspecified lumbar vertebra, subsequent encounter for fracture with delayed healing: Secondary | ICD-10-CM | POA: Diagnosis not present

## 2015-08-06 DIAGNOSIS — N281 Cyst of kidney, acquired: Secondary | ICD-10-CM | POA: Diagnosis not present

## 2015-08-06 DIAGNOSIS — I2699 Other pulmonary embolism without acute cor pulmonale: Secondary | ICD-10-CM | POA: Diagnosis not present

## 2015-08-06 DIAGNOSIS — F10229 Alcohol dependence with intoxication, unspecified: Secondary | ICD-10-CM | POA: Diagnosis present

## 2015-08-06 DIAGNOSIS — W1830XA Fall on same level, unspecified, initial encounter: Secondary | ICD-10-CM | POA: Diagnosis present

## 2015-08-06 DIAGNOSIS — S32009A Unspecified fracture of unspecified lumbar vertebra, initial encounter for closed fracture: Secondary | ICD-10-CM | POA: Diagnosis not present

## 2015-08-06 DIAGNOSIS — Z789 Other specified health status: Secondary | ICD-10-CM | POA: Insufficient documentation

## 2015-08-06 DIAGNOSIS — Z7982 Long term (current) use of aspirin: Secondary | ICD-10-CM | POA: Diagnosis not present

## 2015-08-06 DIAGNOSIS — S32020A Wedge compression fracture of second lumbar vertebra, initial encounter for closed fracture: Secondary | ICD-10-CM | POA: Diagnosis not present

## 2015-08-06 DIAGNOSIS — Z85118 Personal history of other malignant neoplasm of bronchus and lung: Secondary | ICD-10-CM | POA: Diagnosis not present

## 2015-08-06 DIAGNOSIS — S32010A Wedge compression fracture of first lumbar vertebra, initial encounter for closed fracture: Secondary | ICD-10-CM | POA: Insufficient documentation

## 2015-08-06 DIAGNOSIS — Y92009 Unspecified place in unspecified non-institutional (private) residence as the place of occurrence of the external cause: Secondary | ICD-10-CM | POA: Diagnosis not present

## 2015-08-06 DIAGNOSIS — S0101XA Laceration without foreign body of scalp, initial encounter: Secondary | ICD-10-CM | POA: Diagnosis not present

## 2015-08-06 DIAGNOSIS — E872 Acidosis: Secondary | ICD-10-CM

## 2015-08-06 DIAGNOSIS — I9589 Other hypotension: Secondary | ICD-10-CM | POA: Diagnosis not present

## 2015-08-06 DIAGNOSIS — S32000A Wedge compression fracture of unspecified lumbar vertebra, initial encounter for closed fracture: Secondary | ICD-10-CM | POA: Insufficient documentation

## 2015-08-06 LAB — ECHOCARDIOGRAM COMPLETE
Height: 69 in
Weight: 2296.31 oz

## 2015-08-06 LAB — URINALYSIS, ROUTINE W REFLEX MICROSCOPIC
Bilirubin Urine: NEGATIVE
GLUCOSE, UA: NEGATIVE mg/dL
KETONES UR: NEGATIVE mg/dL
LEUKOCYTES UA: NEGATIVE
NITRITE: POSITIVE — AB
PH: 5 (ref 5.0–8.0)
PROTEIN: NEGATIVE mg/dL
Specific Gravity, Urine: 1.024 (ref 1.005–1.030)

## 2015-08-06 LAB — BASIC METABOLIC PANEL
Anion gap: 6 (ref 5–15)
BUN: 14 mg/dL (ref 6–20)
CO2: 25 mmol/L (ref 22–32)
CREATININE: 1.08 mg/dL (ref 0.61–1.24)
Calcium: 8.2 mg/dL — ABNORMAL LOW (ref 8.9–10.3)
Chloride: 105 mmol/L (ref 101–111)
Glucose, Bld: 153 mg/dL — ABNORMAL HIGH (ref 65–99)
POTASSIUM: 3.8 mmol/L (ref 3.5–5.1)
SODIUM: 136 mmol/L (ref 135–145)

## 2015-08-06 LAB — HEPARIN LEVEL (UNFRACTIONATED)
HEPARIN UNFRACTIONATED: 0.1 [IU]/mL — AB (ref 0.30–0.70)
HEPARIN UNFRACTIONATED: 0.34 [IU]/mL (ref 0.30–0.70)

## 2015-08-06 LAB — CBC
HCT: 26.6 % — ABNORMAL LOW (ref 39.0–52.0)
HEMATOCRIT: 29.1 % — AB (ref 39.0–52.0)
Hemoglobin: 9.2 g/dL — ABNORMAL LOW (ref 13.0–17.0)
Hemoglobin: 8.4 g/dL — ABNORMAL LOW (ref 13.0–17.0)
MCH: 29.1 pg (ref 26.0–34.0)
MCH: 29.1 pg (ref 26.0–34.0)
MCHC: 31.6 g/dL (ref 30.0–36.0)
MCHC: 31.6 g/dL (ref 30.0–36.0)
MCV: 92.1 fL (ref 78.0–100.0)
MCV: 92 fL (ref 78.0–100.0)
PLATELETS: 204 10*3/uL (ref 150–400)
Platelets: 187 10*3/uL (ref 150–400)
RBC: 3.16 MIL/uL — AB (ref 4.22–5.81)
RBC: 2.89 MIL/uL — ABNORMAL LOW (ref 4.22–5.81)
RDW: 15.1 % (ref 11.5–15.5)
RDW: 14.8 % (ref 11.5–15.5)
WBC: 7 10*3/uL (ref 4.0–10.5)
WBC: 4.7 10*3/uL (ref 4.0–10.5)

## 2015-08-06 LAB — HEMOGLOBIN
HEMOGLOBIN: 10.2 g/dL — AB (ref 13.0–17.0)
HEMOGLOBIN: 9.3 g/dL — AB (ref 13.0–17.0)
Hemoglobin: 8.1 g/dL — ABNORMAL LOW (ref 13.0–17.0)

## 2015-08-06 LAB — LACTIC ACID, PLASMA
LACTIC ACID, VENOUS: 3 mmol/L — AB (ref 0.5–2.0)
Lactic Acid, Venous: 1.2 mmol/L (ref 0.5–2.0)

## 2015-08-06 LAB — GLUCOSE, CAPILLARY
GLUCOSE-CAPILLARY: 174 mg/dL — AB (ref 65–99)
GLUCOSE-CAPILLARY: 66 mg/dL (ref 65–99)
GLUCOSE-CAPILLARY: 126 mg/dL — AB (ref 65–99)
Glucose-Capillary: 59 mg/dL — ABNORMAL LOW (ref 65–99)
Glucose-Capillary: 69 mg/dL (ref 65–99)

## 2015-08-06 LAB — BLOOD PRODUCT ORDER (VERBAL) VERIFICATION

## 2015-08-06 LAB — URINE MICROSCOPIC-ADD ON

## 2015-08-06 LAB — PSA: PSA: 0.52 ng/mL (ref 0.00–4.00)

## 2015-08-06 LAB — PROCALCITONIN

## 2015-08-06 LAB — APTT: aPTT: 34 seconds (ref 24–37)

## 2015-08-06 LAB — HEMATOCRIT: HCT: 32.9 % — ABNORMAL LOW (ref 39.0–52.0)

## 2015-08-06 MED ORDER — DEXTROSE 50 % IV SOLN
INTRAVENOUS | Status: AC
  Administered 2015-08-06: 50 mL
  Filled 2015-08-06: qty 50

## 2015-08-06 MED ORDER — PERFLUTREN LIPID MICROSPHERE
1.0000 mL | INTRAVENOUS | Status: AC | PRN
  Administered 2015-08-06: 2 mL via INTRAVENOUS
  Filled 2015-08-06: qty 10

## 2015-08-06 MED ORDER — ASPIRIN EC 81 MG PO TBEC
81.0000 mg | DELAYED_RELEASE_TABLET | Freq: Every day | ORAL | Status: DC
  Administered 2015-08-07 – 2015-08-08 (×2): 81 mg via ORAL
  Filled 2015-08-06 (×2): qty 1

## 2015-08-06 MED ORDER — SODIUM CHLORIDE 0.9 % IV BOLUS (SEPSIS)
500.0000 mL | Freq: Once | INTRAVENOUS | Status: AC
  Administered 2015-08-06: 500 mL via INTRAVENOUS

## 2015-08-06 MED ORDER — DEXTROSE-NACL 5-0.9 % IV SOLN
INTRAVENOUS | Status: AC
  Administered 2015-08-06 – 2015-08-07 (×2): via INTRAVENOUS

## 2015-08-06 NOTE — Progress Notes (Addendum)
ANTICOAGULATION CONSULT NOTE - Follow Up Consult  Pharmacy Consult for Heparin Indication: pulmonary embolus  Allergies  Allergen Reactions  . Hydrocodone Itching    Patient Measurements: Height: '5\' 9"'$  (175.3 cm) Weight: 143 lb 8.3 oz (65.1 kg) IBW/kg (Calculated) : 70.7 Heparin Dosing Weight: 65 kg  Vital Signs: Temp: 97.9 F (36.6 C) (05/23 0830) Temp Source: Oral (05/23 0830) BP: 136/89 mmHg (05/23 0830) Pulse Rate: 11 (05/23 0830)  Labs:  Recent Labs  08/05/15 1934 08/05/15 2115 08/06/15 0007 08/06/15 0333 08/06/15 0526 08/06/15 1104  HGB 10.2* 10.2* 10.2* 9.2* 9.3*  --   HCT 32.5* 32.7* 32.9* 29.1*  --   --   PLT 322 189  --  204  --   --   APTT  --   --  34  --   --   --   LABPROT 14.9  --   --   --   --   --   INR 1.15  --   --   --   --   --   HEPARINUNFRC  --   --   --  0.10*  --  <0.10*  CREATININE 1.66*  --   --  1.08  --   --     Estimated Creatinine Clearance: 55.3 mL/min (by C-G formula based on Cr of 1.08).   Medications:  Heparin @ 1100 units/hr (11 ml/hr)  Assessment: 28 YOM with hx lung CA s/p recent resection who [facility] s/p a syncopal episode resulting in trauma to the R hand and head - woke up in a pool of blood and hypotensive. Work-up revealed a new PE and pharmacy was consulted to start heparin for anticoagulation. It was decided to aim for the lower goal range of 0.3-0.5 due to the patient's recent bleeding and injuries.  Heparin level this morning resulted as SUBtherapeutic despite a rate increase this morning (HL <0.1, goal of 0.3-0.5). Hgb 9.3 << 10.2, plts wnl - no further overt bleeding noted. RN reports infusing at the right rate and no issues with the lines. Will increase and recheck an afternoon HL.  Goal of Therapy:  Heparin level 0.3-0.5 units/ml Monitor platelets by anticoagulation protocol: Yes   Plan:  1. Increase Heparin to 1350 units/hr (13.5 ml/hr) 2. Will continue to monitor for any  signs/symptoms of bleeding and will follow up with heparin level in 6 hours   Alycia Rossetti, PharmD, BCPS Clinical Pharmacist Pager: [phone] 08/06/2015 12:00 PM     Addendum: Heparin level is now therapeutic at 0.34. Continue heparin at 1350 units/hr and follow up AM labs.  Nena Jordan, PharmD, BCPS 08/06/2015, 9:04 PM

## 2015-08-06 NOTE — Progress Notes (Signed)
Hypoglycemic Event  CBG: Results for MONTARIUS, KITAGAWA (MRN [id number]) as of 08/06/2015 02:01  Ref. Range 08/06/2015 01:54  Glucose-Capillary Latest Ref Range: 65-99 mg/dL 59 (L)    Treatment: D50 IV 50 mL  Symptoms: None  Follow-up CBG: Time: CBG Result: Results for TINY, RIETZ (MRN [id number]) as of 08/06/2015 04:04  Ref. Range 08/06/2015 02:15  Glucose-Capillary Latest Ref Range: 65-99 mg/dL 174 (H)    Possible Reasons for Event: Inadequate meal intake  Comments/MD notified:    Viviano Simas

## 2015-08-06 NOTE — Consult Note (Signed)
Reason for Consult:Fall Referring Physician: Vernell Leep  Timothy Wood is an 75 y.o. male.  HPI: Timothy Wood was in his usual state of health when he suffered a syncopal episode and fell in his house. He was found in a pool of blood. He was brought to the ED as a level 1 trauma activation 2/2 low blood pressure but responded well to fluids. His trauma workup showed L1 and L2 compression fxs and some superficial lacerations which were closed. He was admitted to internal medicine and neurosurgery was consulted. He did suffer a fall getting out of the bath about 2 weeks ago where he bruised his back.  Past Medical History  Diagnosis Date  . Hypertension   . DVT (deep venous thrombosis) ([place])   . Hypercholesteremia   . Lung cancer [facility])     Past Surgical History  Procedure Laterality Date  . Coronary angioplasty with stent placement    . Lung biopsy    . Back surgery    . Tonsillectomy      History reviewed. No pertinent family history.  Social History:  reports that he has been smoking.  He does not have any smokeless tobacco history on file. He reports that he drinks alcohol. He reports that he does not use illicit drugs.  Allergies:  Allergies  Allergen Reactions  . Hydrocodone Itching    Medications: I have reviewed the patient's current medications.  Results for orders placed or performed during the hospital encounter of 08/05/15 (from the past 48 hour(s))  Prepare fresh frozen plasma     Status: None   Collection Time: 08/05/15  7:15 PM  Result Value Ref Range   Unit Number [id number]    Blood Component Type LIQ PLASMA    Unit division 00    Status of Unit REL FROM [facility]    Unit tag comment VERBAL ORDERS PER DR YAO    Transfusion Status OK TO TRANSFUSE    Unit Number [id number]    Blood Component Type LIQ PLASMA    Unit division 00    Status of Unit REL FROM [facility]    Unit tag comment VERBAL ORDERS PER DR YAO    Transfusion Status OK TO TRANSFUSE   Type and  screen     Status: None   Collection Time: 08/05/15  7:30 PM  Result Value Ref Range   ABO/RH(D) B POS    Antibody Screen NEG    Sample Expiration 08/08/2015    Unit Number [id number]    Blood Component Type RED CELLS,LR    Unit division 00    Status of Unit REL FROM [facility]    Unit tag comment VERBAL ORDERS PER DR YAO    Transfusion Status OK TO TRANSFUSE    Crossmatch Result NOT NEEDED    Unit Number [id number]    Blood Component Type RBC LR PHER1    Unit division 00    Status of Unit REL FROM [facility]    Unit tag comment VERBAL ORDERS PER DR YAO    Transfusion Status OK TO TRANSFUSE    Crossmatch Result NOT NEEDED   ABO/Rh     Status: None   Collection Time: 08/05/15  7:30 PM  Result Value Ref Range   ABO/RH(D) B POS   CDS serology     Status: None   Collection Time: 08/05/15  7:34 PM  Result Value Ref Range   CDS serology specimen      SPECIMEN WILL BE HELD FOR 14 DAYS  IF TESTING IS REQUIRED  Comprehensive metabolic panel     Status: Abnormal   Collection Time: 08/05/15  7:34 PM  Result Value Ref Range   Sodium 134 (L) 135 - 145 mmol/L   Potassium 4.5 3.5 - 5.1 mmol/L   Chloride 103 101 - 111 mmol/L   CO2 19 (L) 22 - 32 mmol/L   Glucose, Bld 103 (H) 65 - 99 mg/dL   BUN 18 6 - 20 mg/dL   Creatinine, Ser 1.66 (H) 0.61 - 1.24 mg/dL   Calcium 8.2 (L) 8.9 - 10.3 mg/dL   Total Protein 5.3 (L) 6.5 - 8.1 g/dL   Albumin 3.2 (L) 3.5 - 5.0 g/dL   AST 28 15 - 41 U/L   ALT 15 (L) 17 - 63 U/L   Alkaline Phosphatase 72 38 - 126 U/L   Total Bilirubin 0.9 0.3 - 1.2 mg/dL   GFR calc non Af Amer 39 (L) >60 mL/min   GFR calc Af Amer 45 (L) >60 mL/min    Comment: (NOTE) The eGFR has been calculated using the CKD EPI equation. This calculation has not been validated in all clinical situations. eGFR's persistently <60 mL/min signify possible Chronic Kidney Disease.    Anion gap 12 5 - 15  CBC     Status: Abnormal   Collection Time: 08/05/15  7:34 PM  Result Value Ref Range    WBC 9.7 4.0 - 10.5 K/uL   RBC 3.48 (L) 4.22 - 5.81 MIL/uL   Hemoglobin 10.2 (L) 13.0 - 17.0 g/dL   HCT 32.5 (L) 39.0 - 52.0 %   MCV 93.4 78.0 - 100.0 fL   MCH 29.3 26.0 - 34.0 pg   MCHC 31.4 30.0 - 36.0 g/dL   RDW 15.2 11.5 - 15.5 %   Platelets 322 150 - 400 K/uL  Protime-INR     Status: None   Collection Time: 08/05/15  7:34 PM  Result Value Ref Range   Prothrombin Time 14.9 11.6 - 15.2 seconds   INR 1.15 0.00 - 1.49  Ethanol     Status: Abnormal   Collection Time: 08/05/15  7:35 PM  Result Value Ref Range   Alcohol, Ethyl (B) 156 (H) <5 mg/dL    Comment:        LOWEST DETECTABLE LIMIT FOR SERUM ALCOHOL IS 5 mg/dL FOR MEDICAL PURPOSES ONLY   I-Stat Troponin, ED     Status: None   Collection Time: 08/05/15  7:39 PM  Result Value Ref Range   Troponin i, poc 0.00 0.00 - 0.08 ng/mL   Comment 3            Comment: Due to the release kinetics of cTnI, a negative result within the first hours of the onset of symptoms does not rule out myocardial infarction with certainty. If myocardial infarction is still suspected, repeat the test at appropriate intervals.   I-Stat CG4 Lactic Acid, ED     Status: Abnormal   Collection Time: 08/05/15  7:41 PM  Result Value Ref Range   Lactic Acid, Venous 5.62 (HH) 0.5 - 2.0 mmol/L   Comment NOTIFIED PHYSICIAN   CBC     Status: Abnormal   Collection Time: 08/05/15  9:15 PM  Result Value Ref Range   WBC 7.8 4.0 - 10.5 K/uL   RBC 3.52 (L) 4.22 - 5.81 MIL/uL   Hemoglobin 10.2 (L) 13.0 - 17.0 g/dL   HCT 32.7 (L) 39.0 - 52.0 %   MCV 92.9 78.0 - 100.0 fL  MCH 29.0 26.0 - 34.0 pg   MCHC 31.2 30.0 - 36.0 g/dL   RDW 15.2 11.5 - 15.5 %   Platelets 189 150 - 400 K/uL  Urinalysis, Routine w reflex microscopic     Status: Abnormal   Collection Time: 08/05/15 11:48 PM  Result Value Ref Range   Color, Urine YELLOW YELLOW   APPearance CLEAR CLEAR   Specific Gravity, Urine 1.024 1.005 - 1.030   pH 5.0 5.0 - 8.0   Glucose, UA NEGATIVE NEGATIVE  mg/dL   Hgb urine dipstick SMALL (A) NEGATIVE   Bilirubin Urine NEGATIVE NEGATIVE   Ketones, ur NEGATIVE NEGATIVE mg/dL   Protein, ur NEGATIVE NEGATIVE mg/dL   Nitrite POSITIVE (A) NEGATIVE   Leukocytes, UA NEGATIVE NEGATIVE  Urine microscopic-add on     Status: Abnormal   Collection Time: 08/05/15 11:48 PM  Result Value Ref Range   Squamous Epithelial / LPF 0-5 (A) NONE SEEN   WBC, UA 0-5 0 - 5 WBC/hpf   RBC / HPF 0-5 0 - 5 RBC/hpf   Bacteria, UA RARE (A) NONE SEEN   Casts HYALINE CASTS (A) NEGATIVE  Hemoglobin     Status: Abnormal   Collection Time: 08/06/15 12:07 AM  Result Value Ref Range   Hemoglobin 10.2 (L) 13.0 - 17.0 g/dL  Hematocrit     Status: Abnormal   Collection Time: 08/06/15 12:07 AM  Result Value Ref Range   HCT 32.9 (L) 39.0 - 52.0 %  APTT     Status: None   Collection Time: 08/06/15 12:07 AM  Result Value Ref Range   aPTT 34 24 - 37 seconds  Lactic acid, plasma     Status: Abnormal   Collection Time: 08/06/15 12:07 AM  Result Value Ref Range   Lactic Acid, Venous 3.0 (HH) 0.5 - 2.0 mmol/L    Comment: CRITICAL RESULT CALLED TO, READ BACK BY AND VERIFIED WITH: MARSHALL,C RN 08/06/2015 0108 JORDANS   Procalcitonin - Baseline     Status: None   Collection Time: 08/06/15 12:07 AM  Result Value Ref Range   Procalcitonin <0.10 ng/mL    Comment:        Interpretation: PCT (Procalcitonin) <= 0.5 ng/mL: Systemic infection (sepsis) is not likely. Local bacterial infection is possible. (NOTE)         ICU PCT Algorithm               Non ICU PCT Algorithm    ----------------------------     ------------------------------         PCT < 0.25 ng/mL                 PCT < 0.1 ng/mL     Stopping of antibiotics            Stopping of antibiotics       strongly encouraged.               strongly encouraged.    ----------------------------     ------------------------------       PCT level decrease by               PCT < 0.25 ng/mL       >= 80% from peak PCT       OR  PCT 0.25 - 0.5 ng/mL          Stopping of antibiotics  encouraged.     Stopping of antibiotics           encouraged.    ----------------------------     ------------------------------       PCT level decrease by              PCT >= 0.25 ng/mL       < 80% from peak PCT        AND PCT >= 0.5 ng/mL            Continuin g antibiotics                                              encouraged.       Continuing antibiotics            encouraged.    ----------------------------     ------------------------------     PCT level increase compared          PCT > 0.5 ng/mL         with peak PCT AND          PCT >= 0.5 ng/mL             Escalation of antibiotics                                          strongly encouraged.      Escalation of antibiotics        strongly encouraged.   Glucose, capillary     Status: None   Collection Time: 08/06/15 12:15 AM  Result Value Ref Range   Glucose-Capillary 69 65 - 99 mg/dL  Glucose, capillary     Status: Abnormal   Collection Time: 08/06/15  1:54 AM  Result Value Ref Range   Glucose-Capillary 59 (L) 65 - 99 mg/dL  Glucose, capillary     Status: Abnormal   Collection Time: 08/06/15  2:15 AM  Result Value Ref Range   Glucose-Capillary 174 (H) 65 - 99 mg/dL  Heparin level (unfractionated)     Status: Abnormal   Collection Time: 08/06/15  3:33 AM  Result Value Ref Range   Heparin Unfractionated 0.10 (L) 0.30 - 0.70 IU/mL    Comment:        IF HEPARIN RESULTS ARE BELOW EXPECTED VALUES, AND PATIENT DOSAGE HAS BEEN CONFIRMED, SUGGEST FOLLOW UP TESTING OF ANTITHROMBIN III LEVELS.   CBC     Status: Abnormal   Collection Time: 08/06/15  3:33 AM  Result Value Ref Range   WBC 7.0 4.0 - 10.5 K/uL   RBC 3.16 (L) 4.22 - 5.81 MIL/uL   Hemoglobin 9.2 (L) 13.0 - 17.0 g/dL   HCT 29.1 (L) 39.0 - 52.0 %   MCV 92.1 78.0 - 100.0 fL   MCH 29.1 26.0 - 34.0 pg   MCHC 31.6 30.0 - 36.0 g/dL   RDW 15.1 11.5 - 15.5 %    Platelets 204 150 - 400 K/uL  Basic metabolic panel     Status: Abnormal   Collection Time: 08/06/15  3:33 AM  Result Value Ref Range   Sodium 136 135 - 145 mmol/L   Potassium 3.8 3.5 - 5.1 mmol/L   Chloride 105 101 - 111 mmol/L   CO2 25 22 - 32 mmol/L  Glucose, Bld 153 (H) 65 - 99 mg/dL   BUN 14 6 - 20 mg/dL   Creatinine, Ser 1.08 0.61 - 1.24 mg/dL   Calcium 8.2 (L) 8.9 - 10.3 mg/dL   GFR calc non Af Amer >60 >60 mL/min   GFR calc Af Amer >60 >60 mL/min    Comment: (NOTE) The eGFR has been calculated using the CKD EPI equation. This calculation has not been validated in all clinical situations. eGFR's persistently <60 mL/min signify possible Chronic Kidney Disease.    Anion gap 6 5 - 15  Lactic acid, plasma     Status: None   Collection Time: 08/06/15  3:33 AM  Result Value Ref Range   Lactic Acid, Venous 1.2 0.5 - 2.0 mmol/L  Hemoglobin     Status: Abnormal   Collection Time: 08/06/15  5:26 AM  Result Value Ref Range   Hemoglobin 9.3 (L) 13.0 - 17.0 g/dL  Glucose, capillary     Status: None   Collection Time: 08/06/15  8:09 AM  Result Value Ref Range   Glucose-Capillary 66 65 - 99 mg/dL   Comment 1 Notify RN    Comment 2 Document in Chart   Heparin level (unfractionated)     Status: Abnormal   Collection Time: 08/06/15 11:04 AM  Result Value Ref Range   Heparin Unfractionated <0.10 (L) 0.30 - 0.70 IU/mL    Comment:        IF HEPARIN RESULTS ARE BELOW EXPECTED VALUES, AND PATIENT DOSAGE HAS BEEN CONFIRMED, SUGGEST FOLLOW UP TESTING OF ANTITHROMBIN III LEVELS.     Ct Head Wo Contrast  08/05/2015  CLINICAL DATA:  Level 2 trauma. Found with laceration to head and altered mental status. Right-sided head injury. History lung cancer. Deep venous thrombosis. Hypertension. EXAM: CT HEAD WITHOUT CONTRAST CT CERVICAL SPINE WITHOUT CONTRAST TECHNIQUE: Multidetector CT imaging of the head and cervical spine was performed following the standard protocol without intravenous  contrast. Multiplanar CT image reconstructions of the cervical spine were also generated. COMPARISON:  None. FINDINGS: CT HEAD FINDINGS Sinuses/Soft tissues: High right parietal scalp soft tissue swelling is mild to moderate. No skull fracture. Clear paranasal sinuses and mastoid air cells. Intracranial: No mass lesion, hemorrhage, hydrocephalus, acute infarct, intra-axial, or extra-axial fluid collection. CT CERVICAL SPINE FINDINGS Spinal visualization through the bottom of T1. Prevertebral soft tissues are within normal limits. No apical pneumothorax. Skull base intact. Expected for age cervical spondylosis with endplate osteophytes and loss of intervertebral disc height at C5-6. Facets are well-aligned. Degenerative irregularity surrounds the tip of the odontoid process, without donor site. IMPRESSION: 1. Right-sided scalp soft tissue swelling, without acute intracranial abnormality. 2. Cervical spondylosis, without acute fracture or subluxation. Electronically Signed   By: Abigail Miyamoto M.D.   On: 08/05/2015 20:43   Ct Chest W Contrast  08/05/2015  CLINICAL DATA:  Found with laceration to the head and loss of consciousness. History of lung cancer. EXAM: CT CHEST, ABDOMEN, AND PELVIS WITH CONTRAST TECHNIQUE: Multidetector CT imaging of the chest, abdomen and pelvis was performed following the standard protocol during bolus administration of intravenous contrast. CONTRAST:  17m ISOVUE-300 IOPAMIDOL (ISOVUE-300) INJECTION 61% COMPARISON:  None. FINDINGS: CT CHEST FINDINGS Mediastinum/Lymph Nodes: No masses, or pathologically enlarged lymph nodes. The heart is normal in size. There is calcified atherosclerotic disease of the coronary arteries. There is tortuosity of the aorta with significant calcified and noncalcified plaque throughout its course. There is a nonocclusive pulmonary embolus within right lower lobar pulmonary artery, extending into  its segmental basilar branches. Lungs/Pleura: There is partially  calcified posterior right upper lobe and middle lobe probably postsurgical scarring. Musculoskeletal: No chest wall mass or suspicious bone lesions identified. CT ABDOMEN PELVIS FINDINGS Hepatobiliary: There are numerous hypoattenuated lesions throughout the liver, the larger of which measure water density. Pancreas: No mass, inflammatory changes, or other significant abnormality. Spleen: Within normal limits in size and appearance. Adrenals/Urinary Tract: No masses identified. No evidence of hydronephrosis. There are bilateral partially exophytic large renal cysts, measuring up to 5 cm in the left lower pole. Stomach/Bowel: There is diffuse thickening of the gastric wall. No evidence of small-bowel obstruction. Vascular/Lymphatic: No pathologically enlarged lymph nodes. No evidence of abdominal aortic aneurysm. Atherosclerotic disease with calcified and noncalcified plaque is noted. Reproductive: The prostate gland is enlarged and globular with diffuse thickening of the urinary bladder wall. Other: None. Musculoskeletal: There is an acute fracture of the superior endplates of L2 and L3 vertebral bodies with mild grade 1 compression deformity. There are subacute healing right-sided rib fractures. There is a mixed lytic and sclerotic appearing lesion within the right iliac bone measuring approximately 2.9 cm. There is a sclerotic appearance of the shaft of the left scapula. IMPRESSION: Nonocclusive right lower lobar pulmonary embolus. Numerous hypoattenuated lesions throughout the liver, which measure water density, and by CT criteria represent cysts. Alternatively these may represent necrotic metastatic lesions. Simple appearing renal cysts. Diffuse gastric wall thickening. Direct visualization may be considered as malignancy cannot be excluded. Enlarged globular prostate gland with likely secondary diffuse urinary bladder wall thickening. Please correlate to serum PSA values. Acute superior endplate L2 and L3  vertebral body fractures with grade 1 compression deformities and no alignment abnormality. Mixed lytic and sclerotic right iliac bone lesion ; metastatic disease cannot be excluded. Sclerotic appearance of the left scapula. Likely postsurgical scarring within the right upper and middle lobes of the lung. Advanced calcified and noncalcified atherosclerotic disease of the aorta. These results were called by telephone at the time of interpretation on 08/05/2015 at 9:05 pm to Dr. Tammy Sours , who verbally acknowledged these results. Electronically Signed   By: Fidela Salisbury M.D.   On: 08/05/2015 21:05   Ct Cervical Spine Wo Contrast  08/05/2015  CLINICAL DATA:  Level 2 trauma. Found with laceration to head and altered mental status. Right-sided head injury. History lung cancer. Deep venous thrombosis. Hypertension. EXAM: CT HEAD WITHOUT CONTRAST CT CERVICAL SPINE WITHOUT CONTRAST TECHNIQUE: Multidetector CT imaging of the head and cervical spine was performed following the standard protocol without intravenous contrast. Multiplanar CT image reconstructions of the cervical spine were also generated. COMPARISON:  None. FINDINGS: CT HEAD FINDINGS Sinuses/Soft tissues: High right parietal scalp soft tissue swelling is mild to moderate. No skull fracture. Clear paranasal sinuses and mastoid air cells. Intracranial: No mass lesion, hemorrhage, hydrocephalus, acute infarct, intra-axial, or extra-axial fluid collection. CT CERVICAL SPINE FINDINGS Spinal visualization through the bottom of T1. Prevertebral soft tissues are within normal limits. No apical pneumothorax. Skull base intact. Expected for age cervical spondylosis with endplate osteophytes and loss of intervertebral disc height at C5-6. Facets are well-aligned. Degenerative irregularity surrounds the tip of the odontoid process, without donor site. IMPRESSION: 1. Right-sided scalp soft tissue swelling, without acute intracranial abnormality. 2. Cervical  spondylosis, without acute fracture or subluxation. Electronically Signed   By: Abigail Miyamoto M.D.   On: 08/05/2015 20:43   Ct Abdomen Pelvis W Contrast  08/05/2015  CLINICAL DATA:  Found with laceration to the head and  loss of consciousness. History of lung cancer. EXAM: CT CHEST, ABDOMEN, AND PELVIS WITH CONTRAST TECHNIQUE: Multidetector CT imaging of the chest, abdomen and pelvis was performed following the standard protocol during bolus administration of intravenous contrast. CONTRAST:  116m ISOVUE-300 IOPAMIDOL (ISOVUE-300) INJECTION 61% COMPARISON:  None. FINDINGS: CT CHEST FINDINGS Mediastinum/Lymph Nodes: No masses, or pathologically enlarged lymph nodes. The heart is normal in size. There is calcified atherosclerotic disease of the coronary arteries. There is tortuosity of the aorta with significant calcified and noncalcified plaque throughout its course. There is a nonocclusive pulmonary embolus within right lower lobar pulmonary artery, extending into its segmental basilar branches. Lungs/Pleura: There is partially calcified posterior right upper lobe and middle lobe probably postsurgical scarring. Musculoskeletal: No chest wall mass or suspicious bone lesions identified. CT ABDOMEN PELVIS FINDINGS Hepatobiliary: There are numerous hypoattenuated lesions throughout the liver, the larger of which measure water density. Pancreas: No mass, inflammatory changes, or other significant abnormality. Spleen: Within normal limits in size and appearance. Adrenals/Urinary Tract: No masses identified. No evidence of hydronephrosis. There are bilateral partially exophytic large renal cysts, measuring up to 5 cm in the left lower pole. Stomach/Bowel: There is diffuse thickening of the gastric wall. No evidence of small-bowel obstruction. Vascular/Lymphatic: No pathologically enlarged lymph nodes. No evidence of abdominal aortic aneurysm. Atherosclerotic disease with calcified and noncalcified plaque is noted.  Reproductive: The prostate gland is enlarged and globular with diffuse thickening of the urinary bladder wall. Other: None. Musculoskeletal: There is an acute fracture of the superior endplates of L2 and L3 vertebral bodies with mild grade 1 compression deformity. There are subacute healing right-sided rib fractures. There is a mixed lytic and sclerotic appearing lesion within the right iliac bone measuring approximately 2.9 cm. There is a sclerotic appearance of the shaft of the left scapula. IMPRESSION: Nonocclusive right lower lobar pulmonary embolus. Numerous hypoattenuated lesions throughout the liver, which measure water density, and by CT criteria represent cysts. Alternatively these may represent necrotic metastatic lesions. Simple appearing renal cysts. Diffuse gastric wall thickening. Direct visualization may be considered as malignancy cannot be excluded. Enlarged globular prostate gland with likely secondary diffuse urinary bladder wall thickening. Please correlate to serum PSA values. Acute superior endplate L2 and L3 vertebral body fractures with grade 1 compression deformities and no alignment abnormality. Mixed lytic and sclerotic right iliac bone lesion ; metastatic disease cannot be excluded. Sclerotic appearance of the left scapula. Likely postsurgical scarring within the right upper and middle lobes of the lung. Advanced calcified and noncalcified atherosclerotic disease of the aorta. These results were called by telephone at the time of interpretation on 08/05/2015 at 9:05 pm to Dr. MTammy Sours, who verbally acknowledged these results. Electronically Signed   By: DFidela SalisburyM.D.   On: 08/05/2015 21:05   Dg Chest Portable 1 View  08/05/2015  CLINICAL DATA:  Status post level 2 trauma.  Initial encounter. EXAM: PORTABLE CHEST 1 VIEW COMPARISON:  None. FINDINGS: The lungs are well-aerated and clear. There is no evidence of focal opacification, pleural effusion or pneumothorax.  Postoperative change is noted at the right midlung. The cardiomediastinal silhouette is within normal limits. No acute osseous abnormalities are seen. IMPRESSION: No acute cardiopulmonary process seen. No displaced rib fractures identified. Electronically Signed   By: JGarald BaldingM.D.   On: 08/05/2015 19:48   Dg Hand Complete Right  08/05/2015  CLINICAL DATA:  Right hand laceration. EXAM: RIGHT HAND - COMPLETE 3+ VIEW COMPARISON:  None. FINDINGS: There is no  evidence of fracture or dislocation. Narrowing and sclerosis of the first carpometacarpal joint is noted as well as the second and third distal interphalangeal joints. Soft tissues are unremarkable. IMPRESSION: Findings consistent with osteoarthritis. No acute abnormality or radiopaque foreign body seen in the right hand. Electronically Signed   By: Marijo Conception, M.D.   On: 08/05/2015 20:14    Review of Systems  Constitutional: Negative for weight loss.  HENT: Negative for ear discharge, ear pain, hearing loss and tinnitus.   Eyes: Negative for blurred vision, double vision, photophobia and pain.  Respiratory: Negative for cough, sputum production and shortness of breath.   Cardiovascular: Negative for chest pain.  Gastrointestinal: Negative for nausea, vomiting and abdominal pain.  Genitourinary: Negative for dysuria, urgency, frequency and flank pain.  Musculoskeletal: Negative for myalgias, back pain, joint pain, falls and neck pain.  Neurological: Positive for loss of consciousness. Negative for dizziness, tingling, sensory change, focal weakness and headaches.  Endo/Heme/Allergies: Does not bruise/bleed easily.  Psychiatric/Behavioral: Negative for depression, memory loss and substance abuse. The patient is not nervous/anxious.    Blood pressure 136/89, pulse 11, temperature 97.9 F (36.6 C), temperature source Oral, resp. rate 20, height 5' 9" (1.753 m), weight 65.1 kg (143 lb 8.3 oz), SpO2 100 %. Physical Exam  Vitals  reviewed. Constitutional: He is oriented to person, place, and time. He appears well-developed and well-nourished. He is cooperative. No distress. Cervical collar in place.  HENT:  Head: Normocephalic and atraumatic. Head is without raccoon's eyes, without Battle's sign, without abrasion, without contusion and without laceration.  Right Ear: Hearing and external ear normal. No drainage or tenderness.  Left Ear: Hearing and external ear normal. No drainage or tenderness.  Nose: Nose normal. No nose lacerations, sinus tenderness, nasal deformity or nasal septal hematoma. No epistaxis.  Mouth/Throat: Uvula is midline, oropharynx is clear and moist and mucous membranes are normal. No lacerations. No oropharyngeal exudate.  Eyes: Conjunctivae, EOM and lids are normal. Right eye exhibits no discharge. Left eye exhibits no discharge. No scleral icterus.  Neck: Trachea normal and normal range of motion. Neck supple. No JVD present. No spinous process tenderness and no muscular tenderness present. Carotid bruit is not present. No tracheal deviation present. No thyromegaly present.  Cardiovascular: Normal rate, regular rhythm, normal heart sounds, intact distal pulses and normal pulses.  Exam reveals no gallop and no friction rub.   No murmur heard. Respiratory: Effort normal and breath sounds normal. No stridor. No respiratory distress. He has no wheezes. He has no rales. He exhibits no tenderness, no bony tenderness, no laceration and no crepitus.  GI: Soft. Normal appearance and bowel sounds are normal. He exhibits no distension. There is no tenderness. There is no rigidity, no rebound, no guarding and no CVA tenderness.  Genitourinary: Penis normal.  Musculoskeletal: Normal range of motion. He exhibits no edema or tenderness.       Back:       Right hand: He exhibits laceration.  Lymphadenopathy:    He has no cervical adenopathy.  Neurological: He is alert and oriented to person, place, and time. He  has normal strength. No cranial nerve deficit or sensory deficit. GCS eye subscore is 4. GCS verbal subscore is 5. GCS motor subscore is 6.  Skin: Skin is warm, dry and intact. He is not diaphoretic.  Psychiatric: He has a normal mood and affect. His speech is normal and behavior is normal.    Assessment/Plan: Fall -- Cervical spine cleared and  collar removed. L1,2 compression fxs -- NS to evaluate but given the complete lack of pain or tenderness I doubt these are acute or even subacute. Multiple medical problems -- per primary service  Trauma will sign off. Please call with questions.    Lisette Abu, PA-C Pager: [phone] General Trauma PA Pager: [phone] 08/06/2015, 2:35 PM

## 2015-08-06 NOTE — Progress Notes (Signed)
*  PRELIMINARY RESULTS* Echocardiogram 2D Echocardiogram has been performed.  Timothy Wood 08/06/2015, 9:57 AM

## 2015-08-06 NOTE — Progress Notes (Signed)
Orthopedic Tech Progress Note Patient Details:  KRYSTOPHER KUENZEL Jul 15, 1940 [id number]  Patient ID: Gwenyth Bouillon, male   DOB: November 14, 1940, 75 y.o.   MRN: [id number] Called in bio-tech brace order; spoke with Ramonita Lab, Neamiah Sciarra 08/06/2015, 10:13 AM

## 2015-08-06 NOTE — Consult Note (Signed)
Reason for Consult: Rule out metastatic prostate cancer, bilateral non-complex renal cysts  Referring Physician: Tera Partridge MD  Timothy Wood is an 75 y.o. male.   HPI:   1 - Rule out metastatic prostate cancer - incidental prostate assymetry and few small blastic bone lesions incidental on trauma CT 08/05/15 after fall. PSA 2014 0.37, DRE 2017 (this admission) 60gm w/o nodules and PSA this admission 0.5.  2 - Bilateral non-complex renal cysts - bilateral non-complex renal cysts incidental on trauma CT 07/2015. No mass effect or enhancing nodules.  Today "Timothy Wood" is seen in consultation for above. He is referred by Dr. Algis Liming. Had prior eval for similar issue 2014 by Dr. Diona Fanti.    Past Medical History  Diagnosis Date  . Hypertension   . DVT (deep venous thrombosis) ([place])   . Hypercholesteremia   . Lung cancer [facility])     Past Surgical History  Procedure Laterality Date  . Coronary angioplasty with stent placement    . Lung biopsy    . Back surgery    . Tonsillectomy      History reviewed. No pertinent family history.  Social History:  reports that he has been smoking.  He does not have any smokeless tobacco history on file. He reports that he drinks alcohol. He reports that he does not use illicit drugs.  Allergies:  Allergies  Allergen Reactions  . Hydrocodone Itching    Medications: I have reviewed the patient's current medications.     Review of Systems  Constitutional: Negative.  Negative for fever and chills.  HENT: Negative.   Eyes: Negative.   Respiratory: Negative.   Cardiovascular: Negative.   Gastrointestinal: Negative.   Genitourinary: Negative.   Musculoskeletal: Positive for falls.  Skin: Negative.   Neurological: Negative.   Endo/Heme/Allergies: Negative.   Psychiatric/Behavioral: Positive for memory loss.       Loss of consciousness around time of fall   Blood pressure 114/49, pulse 84, temperature 97.8 F (36.6 C), temperature source  Oral, resp. rate 18, height '5\' 9"'$  (1.753 m), weight 65.1 kg (143 lb 8.3 oz), SpO2 100 %. Physical Exam  Constitutional: He appears well-developed.  Stigmata of recent fall with chin and cheek bruising.  HENT:  Scalp laceration well approximated, no acive bleeding  Eyes: Pupils are equal, round, and reactive to light.  Neck: Normal range of motion.  Cardiovascular: Normal rate.   Respiratory: Effort normal.  GI: Soft.  Non-obese, no hernias  Genitourinary: Penis normal.  No testes masses. DRE 60gm with mild assymetry, no large noduels. Non=-fixed.   Musculoskeletal: Normal range of motion.  Neurological: He is alert.  Skin:  Lt hand laceration with some edema, well approximated with suture in place  Psychiatric: He has a normal mood and affect.    Assessment/Plan:  1 - Rule out metastatic prostate cancer - imaging reviewed and minimal burden of axial blastic lesions. No pelvic adenopathy. DRE w/o nodules and PSA from 2014 and this admission completely normal and minimal interval change. Very low suspicion of significant prostate cancer. Pt reassured. With his baseline level of comorbidity I do not feel that further work-up warranted.  2 - Bilateral non-complex renal cysts - non-complex and no mass effect. No further evaluation warranted. Observe.  3 - Will follow prn at this point. Please call me directly with questions at anytime.    Kyllian Clingerman 08/06/2015, 5:14 PM

## 2015-08-06 NOTE — Progress Notes (Signed)
PROGRESS NOTE  Timothy Wood  RKY:[id number] DOB: [birth year]  DOA: 08/05/2015 PCP: [name], DO   Brief Narrative:  75 year old male, widowed, lives alone, PMH of lung cancer status post resection ([name]), HTN, CAD status post remote stent, GERD, provoked DVT after surgery-completed Coumadin, presented to [facility] ED on 08/05/15 after EMS found patient down on the floor at home in a pool of blood. He was preparing a shot of brandy and next thing he remembered was being on the floor. Positive for hitting his head and LOC. EMS provided IV NS 400 mL prior to arrival. Initial BP 62/40 but mentating well. Seen at PCPs office earlier on day of admission for lightheadedness, BP noted to be 90/60 and antihypertensives discontinued. Level I trauma code activated in ED. Extensive imaging and workup in ED including CT head, neck, chest, abdomen and pelvis. CT head and neck without acute findings. Noted compression fractures of L2 and 3 & nonocclusive right lower lobe PE. BP normalized after IV fluid boluses.   Assessment & Plan:   Principal Problem:   Pulmonary embolism (HCC) Active Problems:   Hypertension   Hypotension arterial   Lung cancer (HCC)   Lactic acidosis   Syncope   CAD (coronary artery disease)   Alcohol intoxication ([place])   Kidney disease   Laceration of head   Laceration of right hand   Normocytic anemia   Bone lesion   Prostate hypertrophy   Pulmonary embolus (HCC)   Alcohol use (HCC)   Hand laceration   Lumbar compression fracture (HCC)   Syncope - Likely secondary to hypotension from antihypertensives, poor oral intake and alcohol intoxication. - Telemetry without arrhythmia's. Continue monitoring. - Follow 2-D echo results. - CT head and C-spine without acute abnormalities.  Hypotension - BP: 62/40 with EMS in the field. 72/43 on arrival in ED. Reported history of intermittent lightheadedness for a year which recently got worse-may have been due to  hypotension. - Multifactorial secondary to antihypertensives (took on morning of admission), poor oral intake and alcohol intoxication - Antihypertensives discontinued by PCP earlier on day of admission. Resuscitated with IV fluids. Hypotension resolved.  Pulmonary embolism - Incidental nonocclusive right lower lobe pulmonary embolism identified on CT chest - Initiated on IV heparin per pharmacy. - Prior history of provoked DVT for which she completed a course of Coumadin. Has history of lung cancer which may increase risk of hypercoagulable state. - If no recurrent fall risk, ideally should be anticoagulated. Patient prefers Coumadin rather than NOAC's (discussed in detail regarding risks versus benefits of both groups of medication with patient and his daughter).  - Plan to initiate Coumadin but will hold off for today until ensure Hb stable.  Alcohol intoxication - Blood alcohol level 156 on admission. Continue CIWA protocol  Acute kidney injury - Admitted with creatinine of 1.6. Resolved after IV fluids. Follow BMP in a.m.  CAD status post stent - Stent done several years ago. No chest pain reported. Was on aspirin and Plavix at home. Now that anticoagulation is being started, may have to continue aspirin alone without Plavix.  Lung cancer - Status post resection 6-8 weeks ago. Discussed with Dr. Julien Nordmann.  Lumbar compression fractures, L2-3 - Gives history of chronic low back pain which seemed to get slightly worse post fall. - ED had discussed with Dr. Kary Kos, Neurosurgery. I too discussed with him> TLSO brace. PT, OT evaluation and symptomatic treatment. Monitor  Falls/trauma with head injury and lacerations - As per patient  and family, has sustained falls a couple of times lately.? Related to hypotension and lightheadedness. - Sutured laceration of right palm, right dorsal upper arm and right parietal scalp by EDP. Tetanus status up-to-date per ED. - Patient was in cervical  collar this morning. Discussed with trauma service will have evaluated and cleared cervical spine and collar removed. - PT evaluation.  Hypoglycemia - Unclear etiology. Follow CBGs closely and treat as per protocol.? Related to poor oral intake. Please temporarily on IV D5W.  Elevated lactate - Secondary to hypotension, acute kidney injury and dehydration. Resolved.  Acute blood loss anemia - Baseline hemoglobin not known. Presented with hemoglobin of 10.2. This is dropped to 8.1. May have been due to blood loss from lacerations. - Follow CBCs closely and transfuse if hemoglobin drops to less than 8 g per DL.  Incidental CT findings - See detailed report below. - Numerous liver lesions, cysts versus necrotic metastatic lesions. - Diffuse gastric wall thickening - Enlarged prostate with diffuse urinary bladder wall thickening - Mixed lytic and sclerotic right iliac bone lesion, metastatic disease is not excluded. Sclerotic appearance of the left scapula. - Apart from significant weight loss since his spouses demise, patient denies GI symptoms. States she has good appetite, no nausea, vomiting, abdominal pain or change in bowel habits. Denies urinary symptoms. - Urology will seen consultation. Suggest PSA and if negative, less likely to have metastatic prostate cancer. - Discussed with Dr. Fidela Salisbury, reading radiologist: Liver lesions likely are cysts but cannot be conclusive. Recommend PET/CT-however will need to be pursued as outpatient.   DVT prophylaxis: On IV heparin for PE. Code Status: Full Family Communication: Discussed extensively with patient's daughter at bedside. I have not yet discussed incidental CT findings with patient or daughter. Disposition Plan: To be determined when medically stable. Home versus SNF.   Consultants:   Trauma service  Procedures:   None  Antimicrobials:   None    Subjective: Feels better. Denies complaints. No chest pain,  dyspnea, headache or dizziness. No overt bleeding reported.  Objective:  Filed Vitals:   08/05/15 2245 08/05/15 2352 08/06/15 0300 08/06/15 0830  BP: 110/58 118/59 121/59 136/89  Pulse: 76 90 88 11  Temp:  97.8 F (36.6 C) 97.3 F (36.3 C) 97.9 F (36.6 C)  TempSrc:  Oral Oral Oral  Resp: '15 16 19 20  '$ Height:  '5\' 9"'$  (1.753 m)    Weight:  65.1 kg (143 lb 8.3 oz)    SpO2: 99% 96% 100% 100%    Intake/Output Summary (Last 24 hours) at 08/06/15 1521 Last data filed at 08/06/15 0600  Gross per 24 hour  Intake 2803.97 ml  Output    775 ml  Net 2028.97 ml   Filed Weights   08/05/15 1938 08/05/15 2352  Weight: 63.504 kg (140 lb) 65.1 kg (143 lb 8.3 oz)    Examination:  General exam: Pleasant elderly male lying comfortably supine in bed. Respiratory system: Clear to auscultation. Respiratory effort normal. Cardiovascular system: S1 & S2 heard, RRR. No JVD, murmurs, rubs, gallops or clicks. No pedal edema. Telemetry: Low voltage. SR. Gastrointestinal system: Abdomen is nondistended, soft and nontender. No organomegaly or masses felt. Normal bowel sounds heard. Central nervous system: Alert and oriented. No focal neurological deficits. Extremities: Symmetric 5 x 5 power. Skin: Sutured crease laceration mid R palm, right dorsal distal upper arm, right parietal scalp Psychiatry: Judgement and insight appear normal. Mood & affect appropriate.     Data Reviewed: I have  personally reviewed following labs and imaging studies  CBC:  Recent Labs Lab 08/05/15 1934 08/05/15 2115 08/06/15 0007 08/06/15 0333 08/06/15 0526 08/06/15 1412  WBC 9.7 7.8  --  7.0  --   --   HGB 10.2* 10.2* 10.2* 9.2* 9.3* 8.1*  HCT 32.5* 32.7* 32.9* 29.1*  --   --   MCV 93.4 92.9  --  92.1  --   --   PLT 322 189  --  204  --   --    Basic Metabolic Panel:  Recent Labs Lab 08/05/15 1934 08/06/15 0333  NA 134* 136  K 4.5 3.8  CL 103 105  CO2 19* 25  GLUCOSE 103* 153*  BUN 18 14  CREATININE  1.66* 1.08  CALCIUM 8.2* 8.2*   GFR: Estimated Creatinine Clearance: 55.3 mL/min (by C-G formula based on Cr of 1.08). Liver Function Tests:  Recent Labs Lab 08/05/15 1934  AST 28  ALT 15*  ALKPHOS 72  BILITOT 0.9  PROT 5.3*  ALBUMIN 3.2*   No results for input(s): LIPASE, AMYLASE in the last 168 hours. No results for input(s): AMMONIA in the last 168 hours. Coagulation Profile:  Recent Labs Lab 08/05/15 1934  INR 1.15   Cardiac Enzymes: No results for input(s): CKTOTAL, CKMB, CKMBINDEX, TROPONINI in the last 168 hours. BNP (last 3 results) No results for input(s): PROBNP in the last 8760 hours. HbA1C: No results for input(s): HGBA1C in the last 72 hours. CBG:  Recent Labs Lab 08/06/15 0015 08/06/15 0154 08/06/15 0215 08/06/15 0809  GLUCAP 69 59* 174* 66   Lipid Profile: No results for input(s): CHOL, HDL, LDLCALC, TRIG, CHOLHDL, LDLDIRECT in the last 72 hours. Thyroid Function Tests: No results for input(s): TSH, T4TOTAL, FREET4, T3FREE, THYROIDAB in the last 72 hours. Anemia Panel: No results for input(s): VITAMINB12, FOLATE, FERRITIN, TIBC, IRON, RETICCTPCT in the last 72 hours.  Sepsis Labs:  Recent Labs Lab 08/05/15 1941 08/06/15 0007 08/06/15 0333  PROCALCITON  --  <0.10  --   LATICACIDVEN 5.62* 3.0* 1.2    No results found for this or any previous visit (from the past 240 hour(s)).       Radiology Studies: Ct Head Wo Contrast  08/05/2015  CLINICAL DATA:  Level 2 trauma. Found with laceration to head and altered mental status. Right-sided head injury. History lung cancer. Deep venous thrombosis. Hypertension. EXAM: CT HEAD WITHOUT CONTRAST CT CERVICAL SPINE WITHOUT CONTRAST TECHNIQUE: Multidetector CT imaging of the head and cervical spine was performed following the standard protocol without intravenous contrast. Multiplanar CT image reconstructions of the cervical spine were also generated. COMPARISON:  None. FINDINGS: CT HEAD FINDINGS  Sinuses/Soft tissues: High right parietal scalp soft tissue swelling is mild to moderate. No skull fracture. Clear paranasal sinuses and mastoid air cells. Intracranial: No mass lesion, hemorrhage, hydrocephalus, acute infarct, intra-axial, or extra-axial fluid collection. CT CERVICAL SPINE FINDINGS Spinal visualization through the bottom of T1. Prevertebral soft tissues are within normal limits. No apical pneumothorax. Skull base intact. Expected for age cervical spondylosis with endplate osteophytes and loss of intervertebral disc height at C5-6. Facets are well-aligned. Degenerative irregularity surrounds the tip of the odontoid process, without donor site. IMPRESSION: 1. Right-sided scalp soft tissue swelling, without acute intracranial abnormality. 2. Cervical spondylosis, without acute fracture or subluxation. Electronically Signed   By: Abigail Miyamoto M.D.   On: 08/05/2015 20:43   Ct Chest W Contrast  08/05/2015  CLINICAL DATA:  Found with laceration to the head and loss  of consciousness. History of lung cancer. EXAM: CT CHEST, ABDOMEN, AND PELVIS WITH CONTRAST TECHNIQUE: Multidetector CT imaging of the chest, abdomen and pelvis was performed following the standard protocol during bolus administration of intravenous contrast. CONTRAST:  152m ISOVUE-300 IOPAMIDOL (ISOVUE-300) INJECTION 61% COMPARISON:  None. FINDINGS: CT CHEST FINDINGS Mediastinum/Lymph Nodes: No masses, or pathologically enlarged lymph nodes. The heart is normal in size. There is calcified atherosclerotic disease of the coronary arteries. There is tortuosity of the aorta with significant calcified and noncalcified plaque throughout its course. There is a nonocclusive pulmonary embolus within right lower lobar pulmonary artery, extending into its segmental basilar branches. Lungs/Pleura: There is partially calcified posterior right upper lobe and middle lobe probably postsurgical scarring. Musculoskeletal: No chest wall mass or suspicious  bone lesions identified. CT ABDOMEN PELVIS FINDINGS Hepatobiliary: There are numerous hypoattenuated lesions throughout the liver, the larger of which measure water density. Pancreas: No mass, inflammatory changes, or other significant abnormality. Spleen: Within normal limits in size and appearance. Adrenals/Urinary Tract: No masses identified. No evidence of hydronephrosis. There are bilateral partially exophytic large renal cysts, measuring up to 5 cm in the left lower pole. Stomach/Bowel: There is diffuse thickening of the gastric wall. No evidence of small-bowel obstruction. Vascular/Lymphatic: No pathologically enlarged lymph nodes. No evidence of abdominal aortic aneurysm. Atherosclerotic disease with calcified and noncalcified plaque is noted. Reproductive: The prostate gland is enlarged and globular with diffuse thickening of the urinary bladder wall. Other: None. Musculoskeletal: There is an acute fracture of the superior endplates of L2 and L3 vertebral bodies with mild grade 1 compression deformity. There are subacute healing right-sided rib fractures. There is a mixed lytic and sclerotic appearing lesion within the right iliac bone measuring approximately 2.9 cm. There is a sclerotic appearance of the shaft of the left scapula. IMPRESSION: Nonocclusive right lower lobar pulmonary embolus. Numerous hypoattenuated lesions throughout the liver, which measure water density, and by CT criteria represent cysts. Alternatively these may represent necrotic metastatic lesions. Simple appearing renal cysts. Diffuse gastric wall thickening. Direct visualization may be considered as malignancy cannot be excluded. Enlarged globular prostate gland with likely secondary diffuse urinary bladder wall thickening. Please correlate to serum PSA values. Acute superior endplate L2 and L3 vertebral body fractures with grade 1 compression deformities and no alignment abnormality. Mixed lytic and sclerotic right iliac bone  lesion ; metastatic disease cannot be excluded. Sclerotic appearance of the left scapula. Likely postsurgical scarring within the right upper and middle lobes of the lung. Advanced calcified and noncalcified atherosclerotic disease of the aorta. These results were called by telephone at the time of interpretation on 08/05/2015 at 9:05 pm to Dr. MTammy Sours, who verbally acknowledged these results. Electronically Signed   By: DFidela SalisburyM.D.   On: 08/05/2015 21:05   Ct Cervical Spine Wo Contrast  08/05/2015  CLINICAL DATA:  Level 2 trauma. Found with laceration to head and altered mental status. Right-sided head injury. History lung cancer. Deep venous thrombosis. Hypertension. EXAM: CT HEAD WITHOUT CONTRAST CT CERVICAL SPINE WITHOUT CONTRAST TECHNIQUE: Multidetector CT imaging of the head and cervical spine was performed following the standard protocol without intravenous contrast. Multiplanar CT image reconstructions of the cervical spine were also generated. COMPARISON:  None. FINDINGS: CT HEAD FINDINGS Sinuses/Soft tissues: High right parietal scalp soft tissue swelling is mild to moderate. No skull fracture. Clear paranasal sinuses and mastoid air cells. Intracranial: No mass lesion, hemorrhage, hydrocephalus, acute infarct, intra-axial, or extra-axial fluid collection. CT CERVICAL SPINE FINDINGS Spinal  visualization through the bottom of T1. Prevertebral soft tissues are within normal limits. No apical pneumothorax. Skull base intact. Expected for age cervical spondylosis with endplate osteophytes and loss of intervertebral disc height at C5-6. Facets are well-aligned. Degenerative irregularity surrounds the tip of the odontoid process, without donor site. IMPRESSION: 1. Right-sided scalp soft tissue swelling, without acute intracranial abnormality. 2. Cervical spondylosis, without acute fracture or subluxation. Electronically Signed   By: Abigail Miyamoto M.D.   On: 08/05/2015 20:43   Ct Abdomen  Pelvis W Contrast  08/05/2015  CLINICAL DATA:  Found with laceration to the head and loss of consciousness. History of lung cancer. EXAM: CT CHEST, ABDOMEN, AND PELVIS WITH CONTRAST TECHNIQUE: Multidetector CT imaging of the chest, abdomen and pelvis was performed following the standard protocol during bolus administration of intravenous contrast. CONTRAST:  154m ISOVUE-300 IOPAMIDOL (ISOVUE-300) INJECTION 61% COMPARISON:  None. FINDINGS: CT CHEST FINDINGS Mediastinum/Lymph Nodes: No masses, or pathologically enlarged lymph nodes. The heart is normal in size. There is calcified atherosclerotic disease of the coronary arteries. There is tortuosity of the aorta with significant calcified and noncalcified plaque throughout its course. There is a nonocclusive pulmonary embolus within right lower lobar pulmonary artery, extending into its segmental basilar branches. Lungs/Pleura: There is partially calcified posterior right upper lobe and middle lobe probably postsurgical scarring. Musculoskeletal: No chest wall mass or suspicious bone lesions identified. CT ABDOMEN PELVIS FINDINGS Hepatobiliary: There are numerous hypoattenuated lesions throughout the liver, the larger of which measure water density. Pancreas: No mass, inflammatory changes, or other significant abnormality. Spleen: Within normal limits in size and appearance. Adrenals/Urinary Tract: No masses identified. No evidence of hydronephrosis. There are bilateral partially exophytic large renal cysts, measuring up to 5 cm in the left lower pole. Stomach/Bowel: There is diffuse thickening of the gastric wall. No evidence of small-bowel obstruction. Vascular/Lymphatic: No pathologically enlarged lymph nodes. No evidence of abdominal aortic aneurysm. Atherosclerotic disease with calcified and noncalcified plaque is noted. Reproductive: The prostate gland is enlarged and globular with diffuse thickening of the urinary bladder wall. Other: None. Musculoskeletal:  There is an acute fracture of the superior endplates of L2 and L3 vertebral bodies with mild grade 1 compression deformity. There are subacute healing right-sided rib fractures. There is a mixed lytic and sclerotic appearing lesion within the right iliac bone measuring approximately 2.9 cm. There is a sclerotic appearance of the shaft of the left scapula. IMPRESSION: Nonocclusive right lower lobar pulmonary embolus. Numerous hypoattenuated lesions throughout the liver, which measure water density, and by CT criteria represent cysts. Alternatively these may represent necrotic metastatic lesions. Simple appearing renal cysts. Diffuse gastric wall thickening. Direct visualization may be considered as malignancy cannot be excluded. Enlarged globular prostate gland with likely secondary diffuse urinary bladder wall thickening. Please correlate to serum PSA values. Acute superior endplate L2 and L3 vertebral body fractures with grade 1 compression deformities and no alignment abnormality. Mixed lytic and sclerotic right iliac bone lesion ; metastatic disease cannot be excluded. Sclerotic appearance of the left scapula. Likely postsurgical scarring within the right upper and middle lobes of the lung. Advanced calcified and noncalcified atherosclerotic disease of the aorta. These results were called by telephone at the time of interpretation on 08/05/2015 at 9:05 pm to Dr. MTammy Sours, who verbally acknowledged these results. Electronically Signed   By: DFidela SalisburyM.D.   On: 08/05/2015 21:05   Dg Chest Portable 1 View  08/05/2015  CLINICAL DATA:  Status post level 2 trauma.  Initial encounter. EXAM: PORTABLE CHEST 1 VIEW COMPARISON:  None. FINDINGS: The lungs are well-aerated and clear. There is no evidence of focal opacification, pleural effusion or pneumothorax. Postoperative change is noted at the right midlung. The cardiomediastinal silhouette is within normal limits. No acute osseous abnormalities are  seen. IMPRESSION: No acute cardiopulmonary process seen. No displaced rib fractures identified. Electronically Signed   By: Garald Balding M.D.   On: 08/05/2015 19:48   Dg Hand Complete Right  08/05/2015  CLINICAL DATA:  Right hand laceration. EXAM: RIGHT HAND - COMPLETE 3+ VIEW COMPARISON:  None. FINDINGS: There is no evidence of fracture or dislocation. Narrowing and sclerosis of the first carpometacarpal joint is noted as well as the second and third distal interphalangeal joints. Soft tissues are unremarkable. IMPRESSION: Findings consistent with osteoarthritis. No acute abnormality or radiopaque foreign body seen in the right hand. Electronically Signed   By: Marijo Conception, M.D.   On: 08/05/2015 20:14        Scheduled Meds: . aspirin  325 mg Oral Daily  . citalopram  20 mg Oral Daily  . clopidogrel  75 mg Oral Daily  . cyclobenzaprine  10 mg Oral BID  . folic acid  1 mg Oral Daily  . multivitamin with minerals  1 tablet Oral Daily  . pantoprazole  40 mg Oral Daily  . rosuvastatin  40 mg Oral q1800  . sodium chloride flush  3 mL Intravenous Q12H  . thiamine  100 mg Oral Daily   Or  . thiamine  100 mg Intravenous Daily   Continuous Infusions: . heparin 1,350 Units/hr (08/06/15 1219)     LOS: 1 day    Time spent: 60 minutes.    [facility], MD [facility] Pager [phone]  If 7PM-7AM, please contact night-coverage [email or web address] Password [email or web address] 08/06/2015, 3:21 PM

## 2015-08-06 NOTE — Progress Notes (Signed)
Results for Timothy Wood, Timothy Wood (MRN [id number]) as of 08/06/2015 01:09  Ref. Range 08/06/2015 00:07  Lactic Acid, Venous Latest Ref Range: 0.5-2.0 mmol/L 3.0 (HH)  NP paged and  Waiting for return call.

## 2015-08-06 NOTE — Consult Note (Signed)
Reason for Consult: L2-L3 compression fractures Referring Physician: [facility]  Timothy Wood is an 75 y.o. male.  HPI: Patient is 56 in general is a multiple syncopal episodes of the last few weeks to weeks ago had a severe one where he fell backwards in her tub and that's we feel like he hurt his back he had another episode last night but doesn't feel like his back was heard [place] however when he went to the ER last night and the scans were obtained he was noted to have to small anterior wedge compression deformities of L2 and L3 he currently denies any numbness tingling his legs denies any bowel bladder difficulties denies any significant pain in his back.  Past Medical History  Diagnosis Date  . Hypertension   . DVT (deep venous thrombosis) ([place])   . Hypercholesteremia   . Lung cancer [facility])     Past Surgical History  Procedure Laterality Date  . Coronary angioplasty with stent placement    . Lung biopsy    . Back surgery    . Tonsillectomy      History reviewed. No pertinent family history.  Social History:  reports that he has been smoking.  He does not have any smokeless tobacco history on file. He reports that he drinks alcohol. He reports that he does not use illicit drugs.  Allergies:  Allergies  Allergen Reactions  . Hydrocodone Itching    Medications: I have reviewed the patient's current medications.  Results for orders placed or performed during the hospital encounter of 08/05/15 (from the past 48 hour(s))  Prepare fresh frozen plasma     Status: None   Collection Time: 08/05/15  7:15 PM  Result Value Ref Range   Unit Number [id number]    Blood Component Type LIQ PLASMA    Unit division 00    Status of Unit REL FROM [facility]    Unit tag comment VERBAL ORDERS PER DR YAO    Transfusion Status OK TO TRANSFUSE    Unit Number [id number]    Blood Component Type LIQ PLASMA    Unit division 00    Status of Unit REL FROM [facility]    Unit tag comment  VERBAL ORDERS PER DR YAO    Transfusion Status OK TO TRANSFUSE   Type and screen     Status: None   Collection Time: 08/05/15  7:30 PM  Result Value Ref Range   ABO/RH(D) B POS    Antibody Screen NEG    Sample Expiration 08/08/2015    Unit Number [id number]    Blood Component Type RED CELLS,LR    Unit division 00    Status of Unit REL FROM [facility]    Unit tag comment VERBAL ORDERS PER DR YAO    Transfusion Status OK TO TRANSFUSE    Crossmatch Result NOT NEEDED    Unit Number [id number]    Blood Component Type RBC LR PHER1    Unit division 00    Status of Unit REL FROM [facility]    Unit tag comment VERBAL ORDERS PER DR YAO    Transfusion Status OK TO TRANSFUSE    Crossmatch Result NOT NEEDED   ABO/Rh     Status: None   Collection Time: 08/05/15  7:30 PM  Result Value Ref Range   ABO/RH(D) B POS   CDS serology     Status: None   Collection Time: 08/05/15  7:34 PM  Result Value Ref Range   CDS serology specimen  SPECIMEN WILL BE HELD FOR 14 DAYS IF TESTING IS REQUIRED  Comprehensive metabolic panel     Status: Abnormal   Collection Time: 08/05/15  7:34 PM  Result Value Ref Range   Sodium 134 (L) 135 - 145 mmol/L   Potassium 4.5 3.5 - 5.1 mmol/L   Chloride 103 101 - 111 mmol/L   CO2 19 (L) 22 - 32 mmol/L   Glucose, Bld 103 (H) 65 - 99 mg/dL   BUN 18 6 - 20 mg/dL   Creatinine, Ser 1.66 (H) 0.61 - 1.24 mg/dL   Calcium 8.2 (L) 8.9 - 10.3 mg/dL   Total Protein 5.3 (L) 6.5 - 8.1 g/dL   Albumin 3.2 (L) 3.5 - 5.0 g/dL   AST 28 15 - 41 U/L   ALT 15 (L) 17 - 63 U/L   Alkaline Phosphatase 72 38 - 126 U/L   Total Bilirubin 0.9 0.3 - 1.2 mg/dL   GFR calc non Af Amer 39 (L) >60 mL/min   GFR calc Af Amer 45 (L) >60 mL/min    Comment: (NOTE) The eGFR has been calculated using the CKD EPI equation. This calculation has not been validated in all clinical situations. eGFR's persistently <60 mL/min signify possible Chronic Kidney Disease.    Anion gap 12 5 - 15  CBC     Status:  Abnormal   Collection Time: 08/05/15  7:34 PM  Result Value Ref Range   WBC 9.7 4.0 - 10.5 K/uL   RBC 3.48 (L) 4.22 - 5.81 MIL/uL   Hemoglobin 10.2 (L) 13.0 - 17.0 g/dL   HCT 32.5 (L) 39.0 - 52.0 %   MCV 93.4 78.0 - 100.0 fL   MCH 29.3 26.0 - 34.0 pg   MCHC 31.4 30.0 - 36.0 g/dL   RDW 15.2 11.5 - 15.5 %   Platelets 322 150 - 400 K/uL  Protime-INR     Status: None   Collection Time: 08/05/15  7:34 PM  Result Value Ref Range   Prothrombin Time 14.9 11.6 - 15.2 seconds   INR 1.15 0.00 - 1.49  Ethanol     Status: Abnormal   Collection Time: 08/05/15  7:35 PM  Result Value Ref Range   Alcohol, Ethyl (B) 156 (H) <5 mg/dL    Comment:        LOWEST DETECTABLE LIMIT FOR SERUM ALCOHOL IS 5 mg/dL FOR MEDICAL PURPOSES ONLY   I-Stat Troponin, ED     Status: None   Collection Time: 08/05/15  7:39 PM  Result Value Ref Range   Troponin i, poc 0.00 0.00 - 0.08 ng/mL   Comment 3            Comment: Due to the release kinetics of cTnI, a negative result within the first hours of the onset of symptoms does not rule out myocardial infarction with certainty. If myocardial infarction is still suspected, repeat the test at appropriate intervals.   I-Stat CG4 Lactic Acid, ED     Status: Abnormal   Collection Time: 08/05/15  7:41 PM  Result Value Ref Range   Lactic Acid, Venous 5.62 (HH) 0.5 - 2.0 mmol/L   Comment NOTIFIED PHYSICIAN   CBC     Status: Abnormal   Collection Time: 08/05/15  9:15 PM  Result Value Ref Range   WBC 7.8 4.0 - 10.5 K/uL   RBC 3.52 (L) 4.22 - 5.81 MIL/uL   Hemoglobin 10.2 (L) 13.0 - 17.0 g/dL   HCT 32.7 (L) 39.0 - 52.0 %  MCV 92.9 78.0 - 100.0 fL   MCH 29.0 26.0 - 34.0 pg   MCHC 31.2 30.0 - 36.0 g/dL   RDW 15.2 11.5 - 15.5 %   Platelets 189 150 - 400 K/uL  Urinalysis, Routine w reflex microscopic     Status: Abnormal   Collection Time: 08/05/15 11:48 PM  Result Value Ref Range   Color, Urine YELLOW YELLOW   APPearance CLEAR CLEAR   Specific Gravity, Urine  1.024 1.005 - 1.030   pH 5.0 5.0 - 8.0   Glucose, UA NEGATIVE NEGATIVE mg/dL   Hgb urine dipstick SMALL (A) NEGATIVE   Bilirubin Urine NEGATIVE NEGATIVE   Ketones, ur NEGATIVE NEGATIVE mg/dL   Protein, ur NEGATIVE NEGATIVE mg/dL   Nitrite POSITIVE (A) NEGATIVE   Leukocytes, UA NEGATIVE NEGATIVE  Urine microscopic-add on     Status: Abnormal   Collection Time: 08/05/15 11:48 PM  Result Value Ref Range   Squamous Epithelial / LPF 0-5 (A) NONE SEEN   WBC, UA 0-5 0 - 5 WBC/hpf   RBC / HPF 0-5 0 - 5 RBC/hpf   Bacteria, UA RARE (A) NONE SEEN   Casts HYALINE CASTS (A) NEGATIVE  Hemoglobin     Status: Abnormal   Collection Time: 08/06/15 12:07 AM  Result Value Ref Range   Hemoglobin 10.2 (L) 13.0 - 17.0 g/dL  Hematocrit     Status: Abnormal   Collection Time: 08/06/15 12:07 AM  Result Value Ref Range   HCT 32.9 (L) 39.0 - 52.0 %  APTT     Status: None   Collection Time: 08/06/15 12:07 AM  Result Value Ref Range   aPTT 34 24 - 37 seconds  Lactic acid, plasma     Status: Abnormal   Collection Time: 08/06/15 12:07 AM  Result Value Ref Range   Lactic Acid, Venous 3.0 (HH) 0.5 - 2.0 mmol/L    Comment: CRITICAL RESULT CALLED TO, READ BACK BY AND VERIFIED WITH: MARSHALL,C RN 08/06/2015 0108 JORDANS   Procalcitonin - Baseline     Status: None   Collection Time: 08/06/15 12:07 AM  Result Value Ref Range   Procalcitonin <0.10 ng/mL    Comment:        Interpretation: PCT (Procalcitonin) <= 0.5 ng/mL: Systemic infection (sepsis) is not likely. Local bacterial infection is possible. (NOTE)         ICU PCT Algorithm               Non ICU PCT Algorithm    ----------------------------     ------------------------------         PCT < 0.25 ng/mL                 PCT < 0.1 ng/mL     Stopping of antibiotics            Stopping of antibiotics       strongly encouraged.               strongly encouraged.    ----------------------------     ------------------------------       PCT level decrease  by               PCT < 0.25 ng/mL       >= 80% from peak PCT       OR PCT 0.25 - 0.5 ng/mL          Stopping of antibiotics  encouraged.     Stopping of antibiotics           encouraged.    ----------------------------     ------------------------------       PCT level decrease by              PCT >= 0.25 ng/mL       < 80% from peak PCT        AND PCT >= 0.5 ng/mL            Continuin g antibiotics                                              encouraged.       Continuing antibiotics            encouraged.    ----------------------------     ------------------------------     PCT level increase compared          PCT > 0.5 ng/mL         with peak PCT AND          PCT >= 0.5 ng/mL             Escalation of antibiotics                                          strongly encouraged.      Escalation of antibiotics        strongly encouraged.   Glucose, capillary     Status: None   Collection Time: 08/06/15 12:15 AM  Result Value Ref Range   Glucose-Capillary 69 65 - 99 mg/dL  Glucose, capillary     Status: Abnormal   Collection Time: 08/06/15  1:54 AM  Result Value Ref Range   Glucose-Capillary 59 (L) 65 - 99 mg/dL  Glucose, capillary     Status: Abnormal   Collection Time: 08/06/15  2:15 AM  Result Value Ref Range   Glucose-Capillary 174 (H) 65 - 99 mg/dL  Heparin level (unfractionated)     Status: Abnormal   Collection Time: 08/06/15  3:33 AM  Result Value Ref Range   Heparin Unfractionated 0.10 (L) 0.30 - 0.70 IU/mL    Comment:        IF HEPARIN RESULTS ARE BELOW EXPECTED VALUES, AND PATIENT DOSAGE HAS BEEN CONFIRMED, SUGGEST FOLLOW UP TESTING OF ANTITHROMBIN III LEVELS.   CBC     Status: Abnormal   Collection Time: 08/06/15  3:33 AM  Result Value Ref Range   WBC 7.0 4.0 - 10.5 K/uL   RBC 3.16 (L) 4.22 - 5.81 MIL/uL   Hemoglobin 9.2 (L) 13.0 - 17.0 g/dL   HCT 29.1 (L) 39.0 - 52.0 %   MCV 92.1 78.0 - 100.0 fL   MCH 29.1 26.0 -  34.0 pg   MCHC 31.6 30.0 - 36.0 g/dL   RDW 15.1 11.5 - 15.5 %   Platelets 204 150 - 400 K/uL  Basic metabolic panel     Status: Abnormal   Collection Time: 08/06/15  3:33 AM  Result Value Ref Range   Sodium 136 135 - 145 mmol/L   Potassium 3.8 3.5 - 5.1 mmol/L   Chloride 105 101 - 111 mmol/L   CO2 25 22 - 32 mmol/L  Glucose, Bld 153 (H) 65 - 99 mg/dL   BUN 14 6 - 20 mg/dL   Creatinine, Ser 1.08 0.61 - 1.24 mg/dL   Calcium 8.2 (L) 8.9 - 10.3 mg/dL   GFR calc non Af Amer >60 >60 mL/min   GFR calc Af Amer >60 >60 mL/min    Comment: (NOTE) The eGFR has been calculated using the CKD EPI equation. This calculation has not been validated in all clinical situations. eGFR's persistently <60 mL/min signify possible Chronic Kidney Disease.    Anion gap 6 5 - 15  Lactic acid, plasma     Status: None   Collection Time: 08/06/15  3:33 AM  Result Value Ref Range   Lactic Acid, Venous 1.2 0.5 - 2.0 mmol/L  Hemoglobin     Status: Abnormal   Collection Time: 08/06/15  5:26 AM  Result Value Ref Range   Hemoglobin 9.3 (L) 13.0 - 17.0 g/dL  Glucose, capillary     Status: None   Collection Time: 08/06/15  8:09 AM  Result Value Ref Range   Glucose-Capillary 66 65 - 99 mg/dL   Comment 1 Notify RN    Comment 2 Document in Chart   Heparin level (unfractionated)     Status: Abnormal   Collection Time: 08/06/15 11:04 AM  Result Value Ref Range   Heparin Unfractionated <0.10 (L) 0.30 - 0.70 IU/mL    Comment:        IF HEPARIN RESULTS ARE BELOW EXPECTED VALUES, AND PATIENT DOSAGE HAS BEEN CONFIRMED, SUGGEST FOLLOW UP TESTING OF ANTITHROMBIN III LEVELS.   Hemoglobin     Status: Abnormal   Collection Time: 08/06/15  2:12 PM  Result Value Ref Range   Hemoglobin 8.1 (L) 13.0 - 17.0 g/dL  PSA     Status: None   Collection Time: 08/06/15  4:30 PM  Result Value Ref Range   PSA 0.52 0.00 - 4.00 ng/mL    Comment: (NOTE) While PSA levels of <=4.0 ng/ml are reported as reference range, some men  with levels below 4.0 ng/ml can have prostate cancer and many men with PSA above 4.0 ng/ml do not have prostate cancer.  Other tests such as free PSA, age specific reference ranges, PSA velocity and PSA doubling time may be helpful especially in men less than 82 years old.   Glucose, capillary     Status: Abnormal   Collection Time: 08/06/15  4:32 PM  Result Value Ref Range   Glucose-Capillary 126 (H) 65 - 99 mg/dL  Provider-confirm verbal Blood Bank order - Type & Screen, RBC, FFP; 4 Units; Order taken: 08/05/2015; 7:07 PM; Level 1 Trauma, Emergency Release     Status: None   Collection Time: 08/06/15  6:00 PM  Result Value Ref Range   Blood product order confirm MD AUTHORIZATION REQUESTED     Ct Head Wo Contrast  08/05/2015  CLINICAL DATA:  Level 2 trauma. Found with laceration to head and altered mental status. Right-sided head injury. History lung cancer. Deep venous thrombosis. Hypertension. EXAM: CT HEAD WITHOUT CONTRAST CT CERVICAL SPINE WITHOUT CONTRAST TECHNIQUE: Multidetector CT imaging of the head and cervical spine was performed following the standard protocol without intravenous contrast. Multiplanar CT image reconstructions of the cervical spine were also generated. COMPARISON:  None. FINDINGS: CT HEAD FINDINGS Sinuses/Soft tissues: High right parietal scalp soft tissue swelling is mild to moderate. No skull fracture. Clear paranasal sinuses and mastoid air cells. Intracranial: No mass lesion, hemorrhage, hydrocephalus, acute infarct, intra-axial, or extra-axial fluid collection.  CT CERVICAL SPINE FINDINGS Spinal visualization through the bottom of T1. Prevertebral soft tissues are within normal limits. No apical pneumothorax. Skull base intact. Expected for age cervical spondylosis with endplate osteophytes and loss of intervertebral disc height at C5-6. Facets are well-aligned. Degenerative irregularity surrounds the tip of the odontoid process, without donor site. IMPRESSION: 1.  Right-sided scalp soft tissue swelling, without acute intracranial abnormality. 2. Cervical spondylosis, without acute fracture or subluxation. Electronically Signed   By: Abigail Miyamoto M.D.   On: 08/05/2015 20:43   Ct Chest W Contrast  08/05/2015  CLINICAL DATA:  Found with laceration to the head and loss of consciousness. History of lung cancer. EXAM: CT CHEST, ABDOMEN, AND PELVIS WITH CONTRAST TECHNIQUE: Multidetector CT imaging of the chest, abdomen and pelvis was performed following the standard protocol during bolus administration of intravenous contrast. CONTRAST:  190m ISOVUE-300 IOPAMIDOL (ISOVUE-300) INJECTION 61% COMPARISON:  None. FINDINGS: CT CHEST FINDINGS Mediastinum/Lymph Nodes: No masses, or pathologically enlarged lymph nodes. The heart is normal in size. There is calcified atherosclerotic disease of the coronary arteries. There is tortuosity of the aorta with significant calcified and noncalcified plaque throughout its course. There is a nonocclusive pulmonary embolus within right lower lobar pulmonary artery, extending into its segmental basilar branches. Lungs/Pleura: There is partially calcified posterior right upper lobe and middle lobe probably postsurgical scarring. Musculoskeletal: No chest wall mass or suspicious bone lesions identified. CT ABDOMEN PELVIS FINDINGS Hepatobiliary: There are numerous hypoattenuated lesions throughout the liver, the larger of which measure water density. Pancreas: No mass, inflammatory changes, or other significant abnormality. Spleen: Within normal limits in size and appearance. Adrenals/Urinary Tract: No masses identified. No evidence of hydronephrosis. There are bilateral partially exophytic large renal cysts, measuring up to 5 cm in the left lower pole. Stomach/Bowel: There is diffuse thickening of the gastric wall. No evidence of small-bowel obstruction. Vascular/Lymphatic: No pathologically enlarged lymph nodes. No evidence of abdominal aortic  aneurysm. Atherosclerotic disease with calcified and noncalcified plaque is noted. Reproductive: The prostate gland is enlarged and globular with diffuse thickening of the urinary bladder wall. Other: None. Musculoskeletal: There is an acute fracture of the superior endplates of L2 and L3 vertebral bodies with mild grade 1 compression deformity. There are subacute healing right-sided rib fractures. There is a mixed lytic and sclerotic appearing lesion within the right iliac bone measuring approximately 2.9 cm. There is a sclerotic appearance of the shaft of the left scapula. IMPRESSION: Nonocclusive right lower lobar pulmonary embolus. Numerous hypoattenuated lesions throughout the liver, which measure water density, and by CT criteria represent cysts. Alternatively these may represent necrotic metastatic lesions. Simple appearing renal cysts. Diffuse gastric wall thickening. Direct visualization may be considered as malignancy cannot be excluded. Enlarged globular prostate gland with likely secondary diffuse urinary bladder wall thickening. Please correlate to serum PSA values. Acute superior endplate L2 and L3 vertebral body fractures with grade 1 compression deformities and no alignment abnormality. Mixed lytic and sclerotic right iliac bone lesion ; metastatic disease cannot be excluded. Sclerotic appearance of the left scapula. Likely postsurgical scarring within the right upper and middle lobes of the lung. Advanced calcified and noncalcified atherosclerotic disease of the aorta. These results were called by telephone at the time of interpretation on 08/05/2015 at 9:05 pm to Dr. MTammy Sours, who verbally acknowledged these results. Electronically Signed   By: DFidela SalisburyM.D.   On: 08/05/2015 21:05   Ct Cervical Spine Wo Contrast  08/05/2015  CLINICAL DATA:  Level 2  trauma. Found with laceration to head and altered mental status. Right-sided head injury. History lung cancer. Deep venous thrombosis.  Hypertension. EXAM: CT HEAD WITHOUT CONTRAST CT CERVICAL SPINE WITHOUT CONTRAST TECHNIQUE: Multidetector CT imaging of the head and cervical spine was performed following the standard protocol without intravenous contrast. Multiplanar CT image reconstructions of the cervical spine were also generated. COMPARISON:  None. FINDINGS: CT HEAD FINDINGS Sinuses/Soft tissues: High right parietal scalp soft tissue swelling is mild to moderate. No skull fracture. Clear paranasal sinuses and mastoid air cells. Intracranial: No mass lesion, hemorrhage, hydrocephalus, acute infarct, intra-axial, or extra-axial fluid collection. CT CERVICAL SPINE FINDINGS Spinal visualization through the bottom of T1. Prevertebral soft tissues are within normal limits. No apical pneumothorax. Skull base intact. Expected for age cervical spondylosis with endplate osteophytes and loss of intervertebral disc height at C5-6. Facets are well-aligned. Degenerative irregularity surrounds the tip of the odontoid process, without donor site. IMPRESSION: 1. Right-sided scalp soft tissue swelling, without acute intracranial abnormality. 2. Cervical spondylosis, without acute fracture or subluxation. Electronically Signed   By: Abigail Miyamoto M.D.   On: 08/05/2015 20:43   Ct Abdomen Pelvis W Contrast  08/05/2015  CLINICAL DATA:  Found with laceration to the head and loss of consciousness. History of lung cancer. EXAM: CT CHEST, ABDOMEN, AND PELVIS WITH CONTRAST TECHNIQUE: Multidetector CT imaging of the chest, abdomen and pelvis was performed following the standard protocol during bolus administration of intravenous contrast. CONTRAST:  147m ISOVUE-300 IOPAMIDOL (ISOVUE-300) INJECTION 61% COMPARISON:  None. FINDINGS: CT CHEST FINDINGS Mediastinum/Lymph Nodes: No masses, or pathologically enlarged lymph nodes. The heart is normal in size. There is calcified atherosclerotic disease of the coronary arteries. There is tortuosity of the aorta with significant  calcified and noncalcified plaque throughout its course. There is a nonocclusive pulmonary embolus within right lower lobar pulmonary artery, extending into its segmental basilar branches. Lungs/Pleura: There is partially calcified posterior right upper lobe and middle lobe probably postsurgical scarring. Musculoskeletal: No chest wall mass or suspicious bone lesions identified. CT ABDOMEN PELVIS FINDINGS Hepatobiliary: There are numerous hypoattenuated lesions throughout the liver, the larger of which measure water density. Pancreas: No mass, inflammatory changes, or other significant abnormality. Spleen: Within normal limits in size and appearance. Adrenals/Urinary Tract: No masses identified. No evidence of hydronephrosis. There are bilateral partially exophytic large renal cysts, measuring up to 5 cm in the left lower pole. Stomach/Bowel: There is diffuse thickening of the gastric wall. No evidence of small-bowel obstruction. Vascular/Lymphatic: No pathologically enlarged lymph nodes. No evidence of abdominal aortic aneurysm. Atherosclerotic disease with calcified and noncalcified plaque is noted. Reproductive: The prostate gland is enlarged and globular with diffuse thickening of the urinary bladder wall. Other: None. Musculoskeletal: There is an acute fracture of the superior endplates of L2 and L3 vertebral bodies with mild grade 1 compression deformity. There are subacute healing right-sided rib fractures. There is a mixed lytic and sclerotic appearing lesion within the right iliac bone measuring approximately 2.9 cm. There is a sclerotic appearance of the shaft of the left scapula. IMPRESSION: Nonocclusive right lower lobar pulmonary embolus. Numerous hypoattenuated lesions throughout the liver, which measure water density, and by CT criteria represent cysts. Alternatively these may represent necrotic metastatic lesions. Simple appearing renal cysts. Diffuse gastric wall thickening. Direct visualization may  be considered as malignancy cannot be excluded. Enlarged globular prostate gland with likely secondary diffuse urinary bladder wall thickening. Please correlate to serum PSA values. Acute superior endplate L2 and L3 vertebral body fractures with  grade 1 compression deformities and no alignment abnormality. Mixed lytic and sclerotic right iliac bone lesion ; metastatic disease cannot be excluded. Sclerotic appearance of the left scapula. Likely postsurgical scarring within the right upper and middle lobes of the lung. Advanced calcified and noncalcified atherosclerotic disease of the aorta. These results were called by telephone at the time of interpretation on 08/05/2015 at 9:05 pm to Dr. Tammy Sours , who verbally acknowledged these results. Electronically Signed   By: Fidela Salisbury M.D.   On: 08/05/2015 21:05   Dg Chest Portable 1 View  08/05/2015  CLINICAL DATA:  Status post level 2 trauma.  Initial encounter. EXAM: PORTABLE CHEST 1 VIEW COMPARISON:  None. FINDINGS: The lungs are well-aerated and clear. There is no evidence of focal opacification, pleural effusion or pneumothorax. Postoperative change is noted at the right midlung. The cardiomediastinal silhouette is within normal limits. No acute osseous abnormalities are seen. IMPRESSION: No acute cardiopulmonary process seen. No displaced rib fractures identified. Electronically Signed   By: Garald Balding M.D.   On: 08/05/2015 19:48   Dg Hand Complete Right  08/05/2015  CLINICAL DATA:  Right hand laceration. EXAM: RIGHT HAND - COMPLETE 3+ VIEW COMPARISON:  None. FINDINGS: There is no evidence of fracture or dislocation. Narrowing and sclerosis of the first carpometacarpal joint is noted as well as the second and third distal interphalangeal joints. Soft tissues are unremarkable. IMPRESSION: Findings consistent with osteoarthritis. No acute abnormality or radiopaque foreign body seen in the right hand. Electronically Signed   By: Marijo Conception,  M.D.   On: 08/05/2015 20:14    Review of Systems  Constitutional: Negative.   HENT: Negative.   Eyes: Negative.   Skin: Negative.    Blood pressure 114/49, pulse 84, temperature 97.8 F (36.6 C), temperature source Oral, resp. rate 18, height _0  (1.753 m), weight 65.1 kg (143 lb 8.3 oz), SpO2 100 %. Physical Exam  Constitutional: He is oriented to person, place, and time. He appears well-developed and well-nourished.  Eyes: Pupils are equal, round, and reactive to light.  Neck: Normal range of motion.  GI: Soft.  Neurological: He is alert and oriented to person, place, and time. GCS eye subscore is 4. GCS verbal subscore is 5. GCS motor subscore is 6.  Strength is 5 out of 5 in his iliopsoas, quads, hamstrings, gastric, into tibialis, and EHL.    Assessment/Plan: 75 year old gentleman with mild compression deformities L2-L3 these do not appear to be unstable however I have recommended TLSO brace for the first 6-8 weeks no instability transition into a lumbar corset he can be mobilized in the brace he can put the brace on when sitting on the side of the bed. It is okay to anticoagulate for his pulmonary embolus and I be happy to follow him as an outpatient with serial x-rays once discharged.  Jasmon Graffam P 08/06/2015, 6:06 PM

## 2015-08-06 NOTE — Progress Notes (Signed)
ANTICOAGULATION CONSULT NOTE - Follow Up Consult  Pharmacy Consult for heparin Indication: pulmonary embolus  Labs:  Recent Labs  08/05/15 1934 08/05/15 2115 08/06/15 0007 08/06/15 0333  HGB 10.2* 10.2* 10.2* 9.2*  HCT 32.5* 32.7* 32.9* 29.1*  PLT 322 189  --  204  APTT  --   --  34  --   LABPROT 14.9  --   --   --   INR 1.15  --   --   --   HEPARINUNFRC  --   --   --  0.10*  CREATININE 1.66*  --   --  1.08     Assessment: 74yo male subtherapeutic on heparin with initial conservative dosing dor PE in setting of trauma and blood loss; per RN no overt bleeding overnight.  Goal of Therapy:  Heparin level 0.3-0.5 units/ml   Plan:  Will increase heparin gtt by 3 units/kg/hr to 1100 units/hr and check level in 6hr.  Wynona Neat, PharmD, BCPS  08/06/2015,5:15 AM

## 2015-08-07 DIAGNOSIS — R55 Syncope and collapse: Secondary | ICD-10-CM

## 2015-08-07 DIAGNOSIS — S61411D Laceration without foreign body of right hand, subsequent encounter: Secondary | ICD-10-CM

## 2015-08-07 DIAGNOSIS — S32000D Wedge compression fracture of unspecified lumbar vertebra, subsequent encounter for fracture with routine healing: Secondary | ICD-10-CM

## 2015-08-07 DIAGNOSIS — F10129 Alcohol abuse with intoxication, unspecified: Secondary | ICD-10-CM

## 2015-08-07 DIAGNOSIS — I959 Hypotension, unspecified: Secondary | ICD-10-CM | POA: Insufficient documentation

## 2015-08-07 DIAGNOSIS — I2699 Other pulmonary embolism without acute cor pulmonale: Secondary | ICD-10-CM

## 2015-08-07 DIAGNOSIS — N400 Enlarged prostate without lower urinary tract symptoms: Secondary | ICD-10-CM

## 2015-08-07 LAB — BASIC METABOLIC PANEL
ANION GAP: 5 (ref 5–15)
BUN: 11 mg/dL (ref 6–20)
CALCIUM: 7.8 mg/dL — AB (ref 8.9–10.3)
CO2: 23 mmol/L (ref 22–32)
CREATININE: 0.94 mg/dL (ref 0.61–1.24)
Chloride: 107 mmol/L (ref 101–111)
Glucose, Bld: 143 mg/dL — ABNORMAL HIGH (ref 65–99)
Potassium: 3.4 mmol/L — ABNORMAL LOW (ref 3.5–5.1)
SODIUM: 135 mmol/L (ref 135–145)

## 2015-08-07 LAB — GLUCOSE, CAPILLARY
GLUCOSE-CAPILLARY: 106 mg/dL — AB (ref 65–99)
GLUCOSE-CAPILLARY: 114 mg/dL — AB (ref 65–99)
GLUCOSE-CAPILLARY: 106 mg/dL — AB (ref 65–99)
GLUCOSE-CAPILLARY: 129 mg/dL — AB (ref 65–99)
Glucose-Capillary: 131 mg/dL — ABNORMAL HIGH (ref 65–99)

## 2015-08-07 LAB — URINE CULTURE

## 2015-08-07 LAB — PROCALCITONIN

## 2015-08-07 LAB — HEPARIN LEVEL (UNFRACTIONATED): HEPARIN UNFRACTIONATED: 0.38 [IU]/mL (ref 0.30–0.70)

## 2015-08-07 LAB — CBC
HCT: 25.2 % — ABNORMAL LOW (ref 39.0–52.0)
Hemoglobin: 7.9 g/dL — ABNORMAL LOW (ref 13.0–17.0)
MCH: 29 pg (ref 26.0–34.0)
MCHC: 31.3 g/dL (ref 30.0–36.0)
MCV: 92.6 fL (ref 78.0–100.0)
PLATELETS: 183 10*3/uL (ref 150–400)
RBC: 2.72 MIL/uL — AB (ref 4.22–5.81)
RDW: 14.9 % (ref 11.5–15.5)
WBC: 4.2 10*3/uL (ref 4.0–10.5)

## 2015-08-07 MED ORDER — WARFARIN SODIUM 5 MG PO TABS
5.0000 mg | ORAL_TABLET | Freq: Once | ORAL | Status: AC
  Administered 2015-08-07: 5 mg via ORAL
  Filled 2015-08-07: qty 1

## 2015-08-07 MED ORDER — WARFARIN - PHARMACIST DOSING INPATIENT
Freq: Every day | Status: DC
  Administered 2015-08-07: 18:00:00

## 2015-08-07 MED ORDER — POTASSIUM CHLORIDE CRYS ER 20 MEQ PO TBCR
20.0000 meq | EXTENDED_RELEASE_TABLET | Freq: Once | ORAL | Status: AC
  Administered 2015-08-07: 20 meq via ORAL
  Filled 2015-08-07: qty 1

## 2015-08-07 NOTE — Progress Notes (Signed)
Pt refusing breakfast and lunch. States he "doesn't like hospital food." Gave patient multiple options for food but states he will wait for his daughter to bring him food.

## 2015-08-07 NOTE — Progress Notes (Signed)
PROGRESS NOTE  Timothy Wood ERD:[id number] DOB: 02-13-1941 DOA: 08/05/2015 PCP: [name], DO  Brief History:  75 year old male, widowed, lives alone, PMH of lung cancer status post resection ([name]), HTN, CAD status post remote stent, GERD, provoked DVT after surgery-completed Coumadin, presented to [facility] ED on 08/05/15 after EMS found patient down on the floor at home in a pool of blood. He was preparing a shot of brandy and next thing he remembered was being on the floor. Positive for hitting his head and LOC. EMS provided IV NS 400 mL prior to arrival. Initial BP 62/40 but mentating well. Seen at PCPs office earlier on day of admission for lightheadedness, BP noted to be 90/60 and antihypertensives discontinued. Level I trauma code activated in ED. Extensive imaging and workup in ED including CT head, neck, chest, abdomen and pelvis. CT head and neck without acute findings. Noted compression fractures of L2 and 3 & nonocclusive right lower lobe PE. BP normalized after IV fluid boluses.  Assessment/Plan: Syncope - Likely secondary to hypotension from antihypertensives, poor oral intake and alcohol intoxication. - Telemetry without arrhythmia's. Continue monitoring. - 08/06/2015 Echo--EF 65-70 percent, grade 1 daily, no WMA - CT head and C-spine without acute abnormalities.  Hypotension - BP: 62/40 with EMS in the field. 72/43 on arrival in ED. Reported history of intermittent lightheadedness for a year which recently got worse-may have been due to hypotension. - Multifactorial secondary to antihypertensives (took on morning of admission), poor oral intake and alcohol intoxication - Antihypertensives discontinued by PCP earlier on day of admission. Resuscitated with IV fluids. Hypotension resolved.  Pulmonary embolism - Incidental nonocclusive right lower lobe pulmonary embolism identified on CT chest - Initiated on IV heparin per pharmacy. - Prior history of provoked  DVT for which she completed a course of Coumadin. Has history of lung cancer which may increase risk of hypercoagulable state. - If no recurrent fall risk, ideally should be anticoagulated. Patient prefers Coumadin rather than NOAC's (discussed in detail regarding risks versus benefits of both groups of medication with patient and his daughter).  -Start coumadin, continue heparin bridge  Alcohol intoxication - Blood alcohol level 156 on admission. Continue CIWA protocol  Acute kidney injury - Admitted with creatinine of 1.6. Resolved after IV fluids.   CAD status post stent - Stent done several years ago. No chest pain reported. Was on aspirin and Plavix at home. Now that anticoagulation is being started, may have to continue aspirin alone without Plavix.   Lung cancer - Status post resection 6-8 weeks ago. Discussed with Dr. Julien Nordmann.  Lumbar compression fractures, L2-3 - Gives history of chronic low back pain which seemed to get slightly worse post fall. - ED had discussed with Dr. Kary Kos, Neurosurgery. I too discussed with him> TLSO brace. PT, OT evaluation and symptomatic treatment. Monitor  Falls/trauma with head injury and lacerations - As per patient and family, has sustained falls a couple of times lately.? Related to hypotension and lightheadedness. - Sutured laceration of right palm, right dorsal upper arm and right parietal scalp by EDP. Tetanus status up-to-date per ED. - Patient was in cervical collar this morning. Discussed with trauma service will have evaluated and cleared cervical spine and collar removed. - PT evaluation-->SNF  Hypoglycemia - likely due to poor po intake in pt with Etoh dependence - may have underlying liver cirrhosis due to chronic Etoh use  -Follow CBGs closely and treat as per  protocol.  -Placed temporarily on IV D5NS--saline lock and observe  Elevated lactate - Secondary to hypotension, acute kidney injury and dehydration. Resolved.  Acute  blood loss anemia - Baseline hemoglobin not known. Presented with hemoglobin of 10.2. This is dropped to 8.1. May have been due to blood loss from lacerations and dilution - Follow CBCs closely and transfuse if hemoglobin drops to less than 8 g per DL.  Incidental CT findings - See detailed report below. - Numerous liver lesions, cysts versus necrotic metastatic lesions. - Diffuse gastric wall thickening--suspect Etoh gastritis - Enlarged prostate with diffuse urinary bladder wall thickening - Mixed lytic and sclerotic right iliac bone lesion, metastatic disease is not excluded. Sclerotic appearance of the left scapula. - Apart from significant weight loss since his spouses demise, patient denies GI symptoms. States he has good appetite, no nausea, vomiting, abdominal pain or change in bowel habits. Denies urinary symptoms. - Appreciate urology, Dr. Georgianne Fick normal with minimal interval change--Very low suspicion of significant prostate cancer--no further workup warranted - Discussed with Dr. Fidela Salisbury, reading radiologist: Liver lesions likely are cysts but cannot be conclusive. Recommend PET/CT-however will need to be pursued as outpatient. -will need GI eval once stable  Hypokalemia -Replete -Check magnesium   Disposition Plan:   SNF 5/25 or 5/26 Family Communication:   Left message on daughter's cell phone  Consultants:  Neurosurgery--Dr. Saintclair Halsted; Urology--Dr. Tresa Moore; Trauma  Code Status:  FULL    Subjective: Patient denies fevers, chills, headache, chest pain, dyspnea, nausea, vomiting, diarrhea, abdominal pain, dysuria, hematuria   Objective: Filed Vitals:   08/06/15 0830 08/06/15 1627 08/06/15 2158 08/07/15 0736  BP: 136/89 114/49 118/52 142/70  Pulse: 81 84 87 79  Temp: 97.9 F (36.6 C) 97.8 F (36.6 C) 98.8 F (37.1 C) 98.1 F (36.7 C)  TempSrc: Oral Oral Oral Oral  Resp: '20 18 18 18  '$ Height:      Weight:   65.9 kg (145 lb 4.5 oz)   SpO2: 100% 100% 96%  97%    Intake/Output Summary (Last 24 hours) at 08/07/15 1504 Last data filed at 08/07/15 1224  Gross per 24 hour  Intake 2240.96 ml  Output   1750 ml  Net 490.96 ml   Weight change: 2.396 kg (5 lb 4.5 oz) Exam:   General:  Pt is alert, follows commands appropriately, not in acute distress  HEENT: No icterus, No thrush, No neck mass, Rodman/AT  Cardiovascular: RRR, S1/S2, no rubs, no gallops  Respiratory: Diminished breath sounds on the left; bibasilar crackles without any wheezing.   Abdomen: Soft/+BS, non tender, non distended, no guarding  Extremities: No edema, No lymphangitis, No petechiae, No rashes, no synovitis   Data Reviewed: I have personally reviewed following labs and imaging studies Basic Metabolic Panel:  Recent Labs Lab 08/05/15 1934 08/06/15 0333 08/07/15 0212  NA 134* 136 135  K 4.5 3.8 3.4*  CL 103 105 107  CO2 19* 25 23  GLUCOSE 103* 153* 143*  BUN '18 14 11  '$ CREATININE 1.66* 1.08 0.94  CALCIUM 8.2* 8.2* 7.8*   Liver Function Tests:  Recent Labs Lab 08/05/15 1934  AST 28  ALT 15*  ALKPHOS 72  BILITOT 0.9  PROT 5.3*  ALBUMIN 3.2*   No results for input(s): LIPASE, AMYLASE in the last 168 hours. No results for input(s): AMMONIA in the last 168 hours. Coagulation Profile:  Recent Labs Lab 08/05/15 1934  INR 1.15   CBC:  Recent Labs Lab 08/05/15 1934 08/05/15 2115  08/06/15 0007 08/06/15 3532 08/06/15 0526 08/06/15 1412 08/06/15 2030 08/07/15 0212  WBC 9.7 7.8  --  7.0  --   --  4.7 4.2  HGB 10.2* 10.2* 10.2* 9.2* 9.3* 8.1* 8.4* 7.9*  HCT 32.5* 32.7* 32.9* 29.1*  --   --  26.6* 25.2*  MCV 93.4 92.9  --  92.1  --   --  92.0 92.6  PLT 322 189  --  204  --   --  187 183   Cardiac Enzymes: No results for input(s): CKTOTAL, CKMB, CKMBINDEX, TROPONINI in the last 168 hours. BNP: Invalid input(s): POCBNP CBG:  Recent Labs Lab 08/06/15 0809 08/06/15 1632 08/06/15 2156 08/07/15 0733 08/07/15 1152  GLUCAP 66 126* 131*  106* 114*   HbA1C: No results for input(s): HGBA1C in the last 72 hours. Urine analysis:    Component Value Date/Time   COLORURINE YELLOW 08/05/2015 2348   APPEARANCEUR CLEAR 08/05/2015 2348   LABSPEC 1.024 08/05/2015 2348   PHURINE 5.0 08/05/2015 2348   GLUCOSEU NEGATIVE 08/05/2015 2348   HGBUR SMALL* 08/05/2015 2348   BILIRUBINUR NEGATIVE 08/05/2015 2348   KETONESUR NEGATIVE 08/05/2015 2348   PROTEINUR NEGATIVE 08/05/2015 2348   NITRITE POSITIVE* 08/05/2015 2348   LEUKOCYTESUR NEGATIVE 08/05/2015 2348   Sepsis Labs: '@LABRCNTIP'$ (procalcitonin:4,lacticidven:4) ) Recent Results (from the past 240 hour(s))  Urine culture     Status: Abnormal   Collection Time: 08/05/15 11:48 PM  Result Value Ref Range Status   Specimen Description URINE, RANDOM  Final   Special Requests NONE  Final   Culture MULTIPLE SPECIES PRESENT, SUGGEST RECOLLECTION (A)  Final   Report Status 08/07/2015 FINAL  Final     Scheduled Meds: . aspirin EC  81 mg Oral Daily  . citalopram  20 mg Oral Daily  . cyclobenzaprine  10 mg Oral BID  . folic acid  1 mg Oral Daily  . multivitamin with minerals  1 tablet Oral Daily  . pantoprazole  40 mg Oral Daily  . potassium chloride  20 mEq Oral Once  . rosuvastatin  40 mg Oral q1800  . sodium chloride flush  3 mL Intravenous Q12H  . thiamine  100 mg Oral Daily  . warfarin  5 mg Oral ONCE-1800  . Warfarin - Pharmacist Dosing Inpatient   Does not apply q1800   Continuous Infusions: . heparin 1,350 Units/hr (08/06/15 2119)    Procedures/Studies: Ct Head Wo Contrast  08/05/2015  CLINICAL DATA:  Level 2 trauma. Found with laceration to head and altered mental status. Right-sided head injury. History lung cancer. Deep venous thrombosis. Hypertension. EXAM: CT HEAD WITHOUT CONTRAST CT CERVICAL SPINE WITHOUT CONTRAST TECHNIQUE: Multidetector CT imaging of the head and cervical spine was performed following the standard protocol without intravenous contrast. Multiplanar  CT image reconstructions of the cervical spine were also generated. COMPARISON:  None. FINDINGS: CT HEAD FINDINGS Sinuses/Soft tissues: High right parietal scalp soft tissue swelling is mild to moderate. No skull fracture. Clear paranasal sinuses and mastoid air cells. Intracranial: No mass lesion, hemorrhage, hydrocephalus, acute infarct, intra-axial, or extra-axial fluid collection. CT CERVICAL SPINE FINDINGS Spinal visualization through the bottom of T1. Prevertebral soft tissues are within normal limits. No apical pneumothorax. Skull base intact. Expected for age cervical spondylosis with endplate osteophytes and loss of intervertebral disc height at C5-6. Facets are well-aligned. Degenerative irregularity surrounds the tip of the odontoid process, without donor site. IMPRESSION: 1. Right-sided scalp soft tissue swelling, without acute intracranial abnormality. 2. Cervical spondylosis, without acute fracture or  subluxation. Electronically Signed   By: Abigail Miyamoto M.D.   On: 08/05/2015 20:43   Ct Chest W Contrast  08/05/2015  CLINICAL DATA:  Found with laceration to the head and loss of consciousness. History of lung cancer. EXAM: CT CHEST, ABDOMEN, AND PELVIS WITH CONTRAST TECHNIQUE: Multidetector CT imaging of the chest, abdomen and pelvis was performed following the standard protocol during bolus administration of intravenous contrast. CONTRAST:  153m ISOVUE-300 IOPAMIDOL (ISOVUE-300) INJECTION 61% COMPARISON:  None. FINDINGS: CT CHEST FINDINGS Mediastinum/Lymph Nodes: No masses, or pathologically enlarged lymph nodes. The heart is normal in size. There is calcified atherosclerotic disease of the coronary arteries. There is tortuosity of the aorta with significant calcified and noncalcified plaque throughout its course. There is a nonocclusive pulmonary embolus within right lower lobar pulmonary artery, extending into its segmental basilar branches. Lungs/Pleura: There is partially calcified posterior  right upper lobe and middle lobe probably postsurgical scarring. Musculoskeletal: No chest wall mass or suspicious bone lesions identified. CT ABDOMEN PELVIS FINDINGS Hepatobiliary: There are numerous hypoattenuated lesions throughout the liver, the larger of which measure water density. Pancreas: No mass, inflammatory changes, or other significant abnormality. Spleen: Within normal limits in size and appearance. Adrenals/Urinary Tract: No masses identified. No evidence of hydronephrosis. There are bilateral partially exophytic large renal cysts, measuring up to 5 cm in the left lower pole. Stomach/Bowel: There is diffuse thickening of the gastric wall. No evidence of small-bowel obstruction. Vascular/Lymphatic: No pathologically enlarged lymph nodes. No evidence of abdominal aortic aneurysm. Atherosclerotic disease with calcified and noncalcified plaque is noted. Reproductive: The prostate gland is enlarged and globular with diffuse thickening of the urinary bladder wall. Other: None. Musculoskeletal: There is an acute fracture of the superior endplates of L2 and L3 vertebral bodies with mild grade 1 compression deformity. There are subacute healing right-sided rib fractures. There is a mixed lytic and sclerotic appearing lesion within the right iliac bone measuring approximately 2.9 cm. There is a sclerotic appearance of the shaft of the left scapula. IMPRESSION: Nonocclusive right lower lobar pulmonary embolus. Numerous hypoattenuated lesions throughout the liver, which measure water density, and by CT criteria represent cysts. Alternatively these may represent necrotic metastatic lesions. Simple appearing renal cysts. Diffuse gastric wall thickening. Direct visualization may be considered as malignancy cannot be excluded. Enlarged globular prostate gland with likely secondary diffuse urinary bladder wall thickening. Please correlate to serum PSA values. Acute superior endplate L2 and L3 vertebral body fractures  with grade 1 compression deformities and no alignment abnormality. Mixed lytic and sclerotic right iliac bone lesion ; metastatic disease cannot be excluded. Sclerotic appearance of the left scapula. Likely postsurgical scarring within the right upper and middle lobes of the lung. Advanced calcified and noncalcified atherosclerotic disease of the aorta. These results were called by telephone at the time of interpretation on 08/05/2015 at 9:05 pm to Dr. MTammy Sours, who verbally acknowledged these results. Electronically Signed   By: DFidela SalisburyM.D.   On: 08/05/2015 21:05   Ct Cervical Spine Wo Contrast  08/05/2015  CLINICAL DATA:  Level 2 trauma. Found with laceration to head and altered mental status. Right-sided head injury. History lung cancer. Deep venous thrombosis. Hypertension. EXAM: CT HEAD WITHOUT CONTRAST CT CERVICAL SPINE WITHOUT CONTRAST TECHNIQUE: Multidetector CT imaging of the head and cervical spine was performed following the standard protocol without intravenous contrast. Multiplanar CT image reconstructions of the cervical spine were also generated. COMPARISON:  None. FINDINGS: CT HEAD FINDINGS Sinuses/Soft tissues: High right parietal scalp  soft tissue swelling is mild to moderate. No skull fracture. Clear paranasal sinuses and mastoid air cells. Intracranial: No mass lesion, hemorrhage, hydrocephalus, acute infarct, intra-axial, or extra-axial fluid collection. CT CERVICAL SPINE FINDINGS Spinal visualization through the bottom of T1. Prevertebral soft tissues are within normal limits. No apical pneumothorax. Skull base intact. Expected for age cervical spondylosis with endplate osteophytes and loss of intervertebral disc height at C5-6. Facets are well-aligned. Degenerative irregularity surrounds the tip of the odontoid process, without donor site. IMPRESSION: 1. Right-sided scalp soft tissue swelling, without acute intracranial abnormality. 2. Cervical spondylosis, without acute  fracture or subluxation. Electronically Signed   By: Abigail Miyamoto M.D.   On: 08/05/2015 20:43   Ct Abdomen Pelvis W Contrast  08/05/2015  CLINICAL DATA:  Found with laceration to the head and loss of consciousness. History of lung cancer. EXAM: CT CHEST, ABDOMEN, AND PELVIS WITH CONTRAST TECHNIQUE: Multidetector CT imaging of the chest, abdomen and pelvis was performed following the standard protocol during bolus administration of intravenous contrast. CONTRAST:  188m ISOVUE-300 IOPAMIDOL (ISOVUE-300) INJECTION 61% COMPARISON:  None. FINDINGS: CT CHEST FINDINGS Mediastinum/Lymph Nodes: No masses, or pathologically enlarged lymph nodes. The heart is normal in size. There is calcified atherosclerotic disease of the coronary arteries. There is tortuosity of the aorta with significant calcified and noncalcified plaque throughout its course. There is a nonocclusive pulmonary embolus within right lower lobar pulmonary artery, extending into its segmental basilar branches. Lungs/Pleura: There is partially calcified posterior right upper lobe and middle lobe probably postsurgical scarring. Musculoskeletal: No chest wall mass or suspicious bone lesions identified. CT ABDOMEN PELVIS FINDINGS Hepatobiliary: There are numerous hypoattenuated lesions throughout the liver, the larger of which measure water density. Pancreas: No mass, inflammatory changes, or other significant abnormality. Spleen: Within normal limits in size and appearance. Adrenals/Urinary Tract: No masses identified. No evidence of hydronephrosis. There are bilateral partially exophytic large renal cysts, measuring up to 5 cm in the left lower pole. Stomach/Bowel: There is diffuse thickening of the gastric wall. No evidence of small-bowel obstruction. Vascular/Lymphatic: No pathologically enlarged lymph nodes. No evidence of abdominal aortic aneurysm. Atherosclerotic disease with calcified and noncalcified plaque is noted. Reproductive: The prostate gland  is enlarged and globular with diffuse thickening of the urinary bladder wall. Other: None. Musculoskeletal: There is an acute fracture of the superior endplates of L2 and L3 vertebral bodies with mild grade 1 compression deformity. There are subacute healing right-sided rib fractures. There is a mixed lytic and sclerotic appearing lesion within the right iliac bone measuring approximately 2.9 cm. There is a sclerotic appearance of the shaft of the left scapula. IMPRESSION: Nonocclusive right lower lobar pulmonary embolus. Numerous hypoattenuated lesions throughout the liver, which measure water density, and by CT criteria represent cysts. Alternatively these may represent necrotic metastatic lesions. Simple appearing renal cysts. Diffuse gastric wall thickening. Direct visualization may be considered as malignancy cannot be excluded. Enlarged globular prostate gland with likely secondary diffuse urinary bladder wall thickening. Please correlate to serum PSA values. Acute superior endplate L2 and L3 vertebral body fractures with grade 1 compression deformities and no alignment abnormality. Mixed lytic and sclerotic right iliac bone lesion ; metastatic disease cannot be excluded. Sclerotic appearance of the left scapula. Likely postsurgical scarring within the right upper and middle lobes of the lung. Advanced calcified and noncalcified atherosclerotic disease of the aorta. These results were called by telephone at the time of interpretation on 08/05/2015 at 9:05 pm to Dr. MTammy Sours, who verbally acknowledged  these results. Electronically Signed   By: Fidela Salisbury M.D.   On: 08/05/2015 21:05   Dg Chest Portable 1 View  08/05/2015  CLINICAL DATA:  Status post level 2 trauma.  Initial encounter. EXAM: PORTABLE CHEST 1 VIEW COMPARISON:  None. FINDINGS: The lungs are well-aerated and clear. There is no evidence of focal opacification, pleural effusion or pneumothorax. Postoperative change is noted at the  right midlung. The cardiomediastinal silhouette is within normal limits. No acute osseous abnormalities are seen. IMPRESSION: No acute cardiopulmonary process seen. No displaced rib fractures identified. Electronically Signed   By: Garald Balding M.D.   On: 08/05/2015 19:48   Dg Hand Complete Right  08/05/2015  CLINICAL DATA:  Right hand laceration. EXAM: RIGHT HAND - COMPLETE 3+ VIEW COMPARISON:  None. FINDINGS: There is no evidence of fracture or dislocation. Narrowing and sclerosis of the first carpometacarpal joint is noted as well as the second and third distal interphalangeal joints. Soft tissues are unremarkable. IMPRESSION: Findings consistent with osteoarthritis. No acute abnormality or radiopaque foreign body seen in the right hand. Electronically Signed   By: Marijo Conception, M.D.   On: 08/05/2015 20:14    Vida Nicol, DO  [facility] Pager [phone]  If 7PM-7AM, please contact night-coverage [email or web address] Password [email or web address] 08/07/2015, 3:04 PM   LOS: 2 days

## 2015-08-07 NOTE — Progress Notes (Addendum)
ANTICOAGULATION CONSULT NOTE - Follow Up Consult  Pharmacy Consult for Heparin and warfarin Indication: pulmonary embolus  Allergies  Allergen Reactions  . Hydrocodone Itching    Patient Measurements: Height: '5\' 9"'$  (175.3 cm) Weight: 145 lb 4.5 oz (65.9 kg) IBW/kg (Calculated) : 70.7 Heparin Dosing Weight: 65 kg  Vital Signs: Temp: 98.1 F (36.7 C) (05/24 0736) Temp Source: Oral (05/24 0736) BP: 142/70 mmHg (05/24 0736) Pulse Rate: 79 (05/24 0736)  Labs:  Recent Labs  08/05/15 1934  08/06/15 0007  08/06/15 0333  08/06/15 1104  08/06/15 2030 08/07/15 0212  HGB 10.2*  < > 10.2*  --  9.2*  < >  --   < > 8.4* 7.9*  HCT 32.5*  < > 32.9*  --  29.1*  --   --   --  26.6* 25.2*  PLT 322  < >  --   --  204  --   --   --  187 183  APTT  --   --  34  --   --   --   --   --   --   --   LABPROT 14.9  --   --   --   --   --   --   --   --   --   INR 1.15  --   --   --   --   --   --   --   --   --   HEPARINUNFRC  --   --   --   < > 0.10*  --  <0.10*  --  0.34 0.38  CREATININE 1.66*  --   --   --  1.08  --   --   --   --  0.94  < > = values in this interval not displayed.  Estimated Creatinine Clearance: 64.3 mL/min (by C-G formula based on Cr of 0.94).   Medications:  Heparin @ 1350 units/hr (13.5 ml/hr)  Assessment: 74 YOM with hx lung CA s/p recent resection who presented to the [facility] on 5/22 s/p a syncopal episode resulting in trauma to the R hand and head - woke up in a pool of blood and hypotensive. Work-up revealed a new PE and pharmacy was consulted to start heparin for anticoagulation. It was decided to aim for the lower goal range of 0.3-0.5 due to the patient's recent bleeding and injuries.  Heparin level this morning was 0.38 on heparin infusion at 1350 units/hr. Hgb is slightly lower but stable with no reported bleeding. Per MD ok to start warfarin today. Baseline INR 1.15 from 5/22 will obtain daily INR's starting on 5/25.  Goal of Therapy:  Heparin level 0.3-0.5  units/ml Monitor platelets by anticoagulation protocol: Yes   Plan:  1. Continue heparin at 1350 units/hr (13.5 ml/hr) 2. Warfarin 5 mg x 1 tonight at 18:00 3. Daily HL, INR and CBC    Vincenza Hews, PharmD, BCPS 08/07/2015, 1:18 PM Pager: [phone]

## 2015-08-07 NOTE — Evaluation (Addendum)
Physical Therapy Evaluation Patient Details Name: Timothy Wood MRN: [id number] DOB: Dec 26, 1940 Today's Date: 08/07/2015   History of Present Illness  75 yo male with onset of PE and then found to have compression wedge fractures of L2 and L3 with a fall history.  Pt states he has continual dizziness issues.  Has PMHx:  lung CA, possible mets, DVT, HTN  Clinical Impression  Pt is getting up to walk with PT and has needed help to don brace, has been dizzy for awhile per his report.  Could not locate a vital sign that contributed to dizziness, and will present a higher fall risk at home.  He is scheduled for further therapy here and will try to progress beyond the need for SNF care as is possible.    Follow Up Recommendations SNF    Equipment Recommendations  None recommended by PT    Recommendations for Other Services Rehab consult     Precautions / Restrictions Precautions Precautions: Fall (telemetry) Required Braces or Orthoses: Spinal Brace Spinal Brace: Thoracolumbosacral orthotic;Applied in sitting position Restrictions Weight Bearing Restrictions: No      Mobility  Bed Mobility Overal bed mobility: Needs Assistance Bed Mobility: Sidelying to Sit;Sit to Sidelying   Sidelying to sit: Min assist;HOB elevated     Sit to sidelying: Min assist (leveled bed and assisted with legs)    Transfers Overall transfer level: Needs assistance Equipment used: Rolling walker (2 wheeled) Transfers: Sit to/from [facility] Sit to Stand: Min guard;Min assist Stand pivot transfers: Min guard       General transfer comment: depends on the amount of dizziness he has  Ambulation/Gait Ambulation/Gait assistance: Min guard;Min assist (min assist for LOB with dizziness) Ambulation Distance (Feet): 150 Feet Assistive device: Rolling walker (2 wheeled) Gait Pattern/deviations: Step-through pattern;Wide base of support;Drifts right/left Gait velocity: reduced Gait  velocity interpretation: Below normal speed for age/gender General Gait Details: checked all vitals after dizziness with walk and did not have any issues  Stairs            Wheelchair Mobility    Modified Rankin (Stroke Patients Only)       Balance Overall balance assessment: Needs assistance Sitting-balance support: Feet supported Sitting balance-Leahy Scale: Good     Standing balance support: Bilateral upper extremity supported Standing balance-Leahy Scale: Fair                               Pertinent Vitals/Pain Pain Assessment: 0-10 Pain Score: 7  Pain Location: back Pain Intervention(s): Monitored during session;Premedicated before session;Repositioned;Other (comment) (applied TLSO for gait)    Home Living Family/patient expects to be discharged to:: Private residence Living Arrangements: Alone Available Help at Discharge:  (Pt is not sure he has help) Type of Home: House Home Access: Stairs to enter Entrance Stairs-Rails: None Entrance Stairs-Number of Steps: 1 Home Layout: One level Home Equipment: Environmental consultant - 2 wheels      Prior Function Level of Independence: Independent               Hand Dominance        Extremity/Trunk Assessment   Upper Extremity Assessment: Overall WFL for tasks assessed           Lower Extremity Assessment: Overall WFL for tasks assessed      Cervical / Trunk Assessment: Kyphotic  Communication   Communication: No difficulties  Cognition Arousal/Alertness: Awake/alert Behavior During Therapy: WFL for tasks assessed/performed  Overall Cognitive Status: Within Functional Limits for tasks assessed                      General Comments General comments (skin integrity, edema, etc.): Pt had BP of  142/70  pre-gait and post gait 108/64, pulses stable and sat 97% pregait and 98% postgait.    Exercises        Assessment/Plan    PT Assessment Patient needs continued PT services  PT  Diagnosis Difficulty walking   PT Problem List Decreased activity tolerance;Decreased balance;Decreased mobility;Decreased coordination;Decreased knowledge of use of DME;Decreased safety awareness;Decreased knowledge of precautions;Cardiopulmonary status limiting activity  PT Treatment Interventions DME instruction;Gait training;Stair training;Functional mobility training;Therapeutic activities;Therapeutic exercise;Balance training;Neuromuscular re-education;Patient/family education   PT Goals (Current goals can be found in the Care Plan section) Acute Rehab PT Goals Patient Stated Goal: to be safe PT Goal Formulation: With patient Time For Goal Achievement: 08/21/15 Potential to Achieve Goals: Good    Frequency Min 3X/week   Barriers to discharge Inaccessible home environment;Decreased caregiver support home alone with steps to enter    Co-evaluation               End of Session Equipment Utilized During Treatment: Gait belt;Back brace Activity Tolerance: Patient tolerated treatment well;Treatment limited secondary to medical complications (Comment) (dizziness) Patient left: in bed;with call bell/phone within reach;with bed alarm set Nurse Communication: Mobility status (vitals were fine)         Time: 8280-0349 PT Time Calculation (min) (ACUTE ONLY): 28 min   Charges:   PT Evaluation $PT Eval Low Complexity: 1 Procedure PT Treatments $Gait Training: 8-22 mins   PT G Codes:        Ramond Dial 08-08-2015, 2:13 PM    Mee Hives, PT MS Acute Rehab Dept. Number: [place] [facility] [place]

## 2015-08-07 NOTE — Care Management Important Message (Signed)
Important Message  Patient Details  Name: Timothy Wood MRN: [id number] Date of Birth: 11-Nov-1940   Medicare Important Message Given:  Yes    Loann Quill 08/07/2015, 11:49 AM

## 2015-08-08 DIAGNOSIS — S32000G Wedge compression fracture of unspecified lumbar vertebra, subsequent encounter for fracture with delayed healing: Secondary | ICD-10-CM

## 2015-08-08 LAB — BASIC METABOLIC PANEL
ANION GAP: 5 (ref 5–15)
BUN: <5 mg/dL — ABNORMAL LOW (ref 6–20)
CHLORIDE: 107 mmol/L (ref 101–111)
CO2: 25 mmol/L (ref 22–32)
Calcium: 8.5 mg/dL — ABNORMAL LOW (ref 8.9–10.3)
Creatinine, Ser: 0.75 mg/dL (ref 0.61–1.24)
GFR calc non Af Amer: >60 mL/min (ref >60)
Glucose, Bld: 101 mg/dL — ABNORMAL HIGH (ref 65–99)
Potassium: 3.6 mmol/L (ref 3.5–5.1)
Sodium: 137 mmol/L (ref 135–145)

## 2015-08-08 LAB — CBC
HCT: 26 % — ABNORMAL LOW (ref 39.0–52.0)
HEMOGLOBIN: 8.2 g/dL — AB (ref 13.0–17.0)
MCH: 29.4 pg (ref 26.0–34.0)
MCHC: 31.5 g/dL (ref 30.0–36.0)
MCV: 93.2 fL (ref 78.0–100.0)
PLATELETS: 188 10*3/uL (ref 150–400)
RBC: 2.79 MIL/uL — AB (ref 4.22–5.81)
RDW: 14.7 % (ref 11.5–15.5)
WBC: 4.4 10*3/uL (ref 4.0–10.5)

## 2015-08-08 LAB — GLUCOSE, CAPILLARY
GLUCOSE-CAPILLARY: 100 mg/dL — AB (ref 65–99)
GLUCOSE-CAPILLARY: 115 mg/dL — AB (ref 65–99)
GLUCOSE-CAPILLARY: 108 mg/dL — AB (ref 65–99)
Glucose-Capillary: 113 mg/dL — ABNORMAL HIGH (ref 65–99)

## 2015-08-08 LAB — PROTIME-INR
INR: 1.15 (ref 0.00–1.49)
Prothrombin Time: 14.9 seconds (ref 11.6–15.2)

## 2015-08-08 LAB — HEPARIN LEVEL (UNFRACTIONATED): Heparin Unfractionated: 0.51 IU/mL (ref 0.30–0.70)

## 2015-08-08 LAB — MAGNESIUM: Magnesium: 1.7 mg/dL (ref 1.7–2.4)

## 2015-08-08 MED ORDER — CLOPIDOGREL BISULFATE 75 MG PO TABS
75.0000 mg | ORAL_TABLET | Freq: Every day | ORAL | Status: DC
  Administered 2015-08-09: 75 mg via ORAL
  Filled 2015-08-08: qty 1

## 2015-08-08 MED ORDER — ENOXAPARIN SODIUM 80 MG/0.8ML ~~LOC~~ SOLN
1.0000 mg/kg | Freq: Two times a day (BID) | SUBCUTANEOUS | Status: DC
  Administered 2015-08-08 – 2015-08-09 (×3): 70 mg via SUBCUTANEOUS
  Filled 2015-08-08 (×3): qty 0.8

## 2015-08-08 MED ORDER — ENOXAPARIN SODIUM 150 MG/ML ~~LOC~~ SOLN: 1.0000 mg/kg | Freq: Two times a day (BID) | SUBCUTANEOUS | Status: DC

## 2015-08-08 MED ORDER — HYDROCODONE-ACETAMINOPHEN 5-325 MG PO TABS: 1.0000 | ORAL_TABLET | ORAL | Status: DC | PRN

## 2015-08-08 MED ORDER — HEPARIN (PORCINE) IN NACL 100-0.45 UNIT/ML-% IJ SOLN
1300.0000 [IU]/h | INTRAMUSCULAR | Status: DC
  Administered 2015-08-08: 1300 [IU]/h via INTRAVENOUS

## 2015-08-08 MED ORDER — MAGNESIUM SULFATE 2 GM/50ML IV SOLN
2.0000 g | Freq: Once | INTRAVENOUS | Status: AC
  Administered 2015-08-08: 2 g via INTRAVENOUS
  Filled 2015-08-08: qty 50

## 2015-08-08 MED ORDER — WARFARIN SODIUM 7.5 MG PO TABS
7.5000 mg | ORAL_TABLET | Freq: Once | ORAL | Status: AC
  Administered 2015-08-08: 7.5 mg via ORAL
  Filled 2015-08-08: qty 1

## 2015-08-08 NOTE — NC FL2 (Signed)
[place] LEVEL OF CARE SCREENING TOOL     IDENTIFICATION  Patient Name: Timothy Wood Birthdate: 11-Oct-1940 Sex: male Admission Date (Current Location): 08/05/2015  [facility] [place] Number:  Herbalist and Address:  [facility] [place] [place] [address] [place], [place] [address]      Provider Number: [id number]  Attending Physician Name and Address:  Orson Eva, MD  Relative Name and Phone Number:  Daniel Nones - daughter. Phone [phone]    Current Level of Care: Hospital Recommended Level of Care: [place] Prior Approval Number:    Date Approved/Denied:   PASRR Number: [id number] (Eff. 08/08/15)  Discharge Plan: SNF    Current Diagnoses: Patient Active Problem List   Diagnosis Date Noted  . Arterial hypotension   . Alcohol use ([place])   . Hand laceration   . Lumbar compression fracture ([place])   . Hypertension 08/05/2015  . Hypotension arterial 08/05/2015  . Pulmonary embolism ([place]) 08/05/2015  . Lung cancer ([place]) 08/05/2015  . Lactic acidosis 08/05/2015  . Syncope 08/05/2015  . CAD (coronary artery disease) 08/05/2015  . Alcohol intoxication ([place]) 08/05/2015  . Kidney disease 08/05/2015  . Laceration of head 08/05/2015  . Laceration of right hand 08/05/2015  . Normocytic anemia 08/05/2015  . Bone lesion 08/05/2015  . Prostate hypertrophy 08/05/2015  . Pulmonary embolus ([place]) 08/05/2015    Orientation RESPIRATION BLADDER Height & Weight     Self, Time, Situation, Place  Normal Continent Weight: 149 lb 11.1 oz (67.9 kg) Height:  '5\' 9"'$  (175.3 cm)  BEHAVIORAL SYMPTOMS/MOOD NEUROLOGICAL BOWEL NUTRITION STATUS      Continent Diet (Heart healthy)  AMBULATORY STATUS COMMUNICATION OF NEEDS Skin   Limited Assist Verbally Other (Comment) (Abrasion to right elbow with foam dressing; Ecchymosis to right face and lower back; Laceration to right/lateral head; stiches on right palm.)                        Personal Care Assistance Level of Assistance  Bathing, Feeding, Dressing Bathing Assistance: Limited assistance Feeding assistance: Independent Dressing Assistance: Limited assistance     Functional Limitations Info  Sight, Hearing, Speech Sight Info: Impaired ( Wears glasses to read) Hearing Info: Adequate Speech Info: Adequate    SPECIAL CARE FACTORS FREQUENCY  PT (By licensed PT)     PT Frequency: Evaluated 5/24 and a minimum of 3X per week recommended              Contractures Contractures Info: Not present    Additional Factors Info  Code Status, Allergies Code Status Info: Full Allergies Info: Hydrocodone           Current Medications (08/08/2015):  This is the current hospital active medication list Current Facility-Administered Medications  Medication Dose Route Frequency Provider Last Rate Last Dose  . acetaminophen (TYLENOL) tablet 650 mg  650 mg Oral Q6H PRN Vianne Bulls, MD       Or  . acetaminophen (TYLENOL) suppository 650 mg  650 mg Rectal Q6H PRN Vianne Bulls, MD      . aspirin EC tablet 81 mg  81 mg Oral Daily Modena Jansky, MD   81 mg at 08/08/15 0908  . bisacodyl (DULCOLAX) EC tablet 5 mg  5 mg Oral Daily PRN Vianne Bulls, MD      . citalopram (CELEXA) tablet 20 mg  20 mg Oral Daily Vianne Bulls, MD   20  mg at 08/08/15 0908  . cyclobenzaprine (FLEXERIL) tablet 10 mg  10 mg Oral BID Vianne Bulls, MD   10 mg at 08/08/15 0909  . enoxaparin (LOVENOX) injection 70 mg  1 mg/kg Subcutaneous Q12H Lauren D Bajbus, RPH   70 mg at 08/08/15 1153  . folic acid (FOLVITE) tablet 1 mg  1 mg Oral Daily Vianne Bulls, MD   1 mg at 08/08/15 0909  . HYDROcodone-acetaminophen (NORCO/VICODIN) 5-325 MG per tablet 1-2 tablet  1-2 tablet Oral Q4H PRN Vianne Bulls, MD   2 tablet at 08/06/15 0855  . LORazepam (ATIVAN) tablet 1 mg  1 mg Oral Q6H PRN Vianne Bulls, MD       Or  . LORazepam (ATIVAN) injection 1 mg  1 mg Intravenous Q6H PRN Vianne Bulls, MD      . multivitamin with minerals tablet 1 tablet  1 tablet Oral Daily Vianne Bulls, MD   1 tablet at 08/08/15 0908  . ondansetron (ZOFRAN) tablet 4 mg  4 mg Oral Q6H PRN Vianne Bulls, MD       Or  . ondansetron (ZOFRAN) injection 4 mg  4 mg Intravenous Q6H PRN Vianne Bulls, MD      . pantoprazole (PROTONIX) EC tablet 40 mg  40 mg Oral Daily Vianne Bulls, MD   40 mg at 08/08/15 0909  . polyethylene glycol (MIRALAX / GLYCOLAX) packet 17 g  17 g Oral Daily PRN Vianne Bulls, MD      . rosuvastatin (CRESTOR) tablet 40 mg  40 mg Oral q1800 Vianne Bulls, MD   40 mg at 08/07/15 1812  . sodium chloride flush (NS) 0.9 % injection 3 mL  3 mL Intravenous Q12H Vianne Bulls, MD   3 mL at 08/08/15 0902  . thiamine (VITAMIN B-1) tablet 100 mg  100 mg Oral Daily Vianne Bulls, MD   100 mg at 08/08/15 0908  . warfarin (COUMADIN) tablet 7.5 mg  7.5 mg Oral ONCE-1800 Lauren D Bajbus, RPH      . Warfarin - Pharmacist Dosing Inpatient   Does not apply q1800 Orson Eva, MD         Discharge Medications: Please see discharge summary for a list of discharge medications.  Relevant Imaging Results:  Relevant Lab Results:   Additional Information [phone].  Patient talks in his sleep.  Sable Feil, LCSW

## 2015-08-08 NOTE — Clinical Social Work Note (Signed)
Clinical Social Work Assessment  Patient Details  Name: Timothy Wood MRN: [id number] Date of Birth: 06-Jul-1940  Date of referral:  08/08/15               Reason for consult:  Facility Placement                Permission sought to share information with:  Family Supports Permission granted to share information::  Yes, Verbal Permission Granted  Name::     Timothy Wood   Agency::     Relationship::  Daughter  Contact Information:  [phone]  Housing/Transportation Living arrangements for the past 2 months:  Single Family Home Source of Information:  Patient Patient Interpreter Needed:  None Criminal Activity/Legal Involvement Pertinent to Current Situation/Hospitalization:  No - Comment as needed Significant Relationships:  Adult Children, Other Family Members (Patient's daughter has 2 adult sons) Lives with:  Self Do you feel safe going back to the place where you live?  Yes (Patient feels safe at home, but understands and agrees that [place] re[facility] will be beneficial before returning home ) Need for family participation in patient care:  Yes (Comment)  Care giving concerns:  Patient reports that he lives alone and his daughter lives in [place].   Social Worker assessment / plan:  CSW talked with patient at the bedside regarding discharge planning and recommendation of [facility] rehab. Patient was lying in bed, awake, alert and moving in bed trying to get comfortable. Mr. Paino was agreeable to talking with CSW regarding discharge planning and also understands and is agreeable to [place] rehab as he lives alone. CSW explained facility search process and provided patient with SNF list for [facility].     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 Mr. Keng informed CSW that per MD, he will be discharging on Friday, 5/26.  Patient/Family's Understanding of and Emotional Response to Diagnosis, Current Treatment, and Prognosis:  Not discussed.  Emotional Assessment Appearance:  Appears younger than stated age Attitude/Demeanor/Rapport:  Other (Appropriate) Affect (typically observed):  Calm, Appropriate, Pleasant Orientation:  Oriented to Self, Oriented to Place, Oriented to  Time, Oriented to Situation Alcohol / Substance use:  Tobacco Use, Alcohol Use, Illicit Drugs (Patient reports that he smokes and drinks alcohol, and does not use illicit drugs.) Psych involvement (Current and /or in the community):  No (Comment)  Discharge Needs  Concerns to be addressed:  No discharge needs identified Readmission within the last 30 days:  No Current discharge risk:  None Barriers to Discharge:  No Barriers Identified   Sable Feil, LCSW 08/08/2015, 2:58 PM

## 2015-08-08 NOTE — Progress Notes (Addendum)
ANTICOAGULATION CONSULT NOTE - Follow Up Consult  Pharmacy Consult for Heparin and warfarin Indication: pulmonary embolus  Allergies  Allergen Reactions  . Hydrocodone Itching    Patient Measurements: Height: '5\' 9"'$  (175.3 cm) Weight: 149 lb 11.1 oz (67.9 kg) IBW/kg (Calculated) : 70.7 Heparin Dosing Weight: 65 kg  Vital Signs: Temp: 97.9 F (36.6 C) (05/25 0754) Temp Source: Oral (05/25 0754) BP: 135/61 mmHg (05/25 0754) Pulse Rate: 67 (05/25 0754)  Labs:  Recent Labs  08/05/15 1934  08/06/15 0007 08/06/15 0333  08/06/15 2030 08/07/15 0212 08/08/15 0540  HGB 10.2*  < > 10.2* 9.2*  < > 8.4* 7.9* 8.2*  HCT 32.5*  < > 32.9* 29.1*  --  26.6* 25.2* 26.0*  PLT 322  < >  --  204  --  187 183 188  APTT  --   --  34  --   --   --   --   --   LABPROT 14.9  --   --   --   --   --   --  14.9  INR 1.15  --   --   --   --   --   --  1.15  HEPARINUNFRC  --   --   --  0.10*  < > 0.34 0.38 0.51  CREATININE 1.66*  --   --  1.08  --   --  0.94 0.75  < > = values in this interval not displayed.  Estimated Creatinine Clearance: 77.8 mL/min (by C-G formula based on Cr of 0.75).   Medications:  Heparin @ 1350 units/hr (13.5 ml/hr)  Assessment: 81 YOM with hx lung CA s/p recent resection who presented to the [facility] on 5/22 s/p a syncopal episode resulting in trauma to the R hand and head - woke up in a pool of blood and hypotensive. Work-up revealed a new PE and pharmacy was consulted to start heparin for anticoagulation. It was decided to aim for the lower goal range of 0.3-0.5 due to the patient's recent bleeding and injuries.  Heparin level this morning was 0.51 on heparin infusion at 1350 units/hr- just above goal. INR 1.15 after starting warfarin last night- today is D#2/5 of overlap.  Hgb 8.2- fluctuating, plts WNL- no reported bleeding.  Goal of Therapy:  INR 2-3 Heparin level 0.3-0.5 units/ml Monitor platelets by anticoagulation protocol: Yes   Plan:  Decrease heparin to  1300 units/hr (13 ml/hr) Warfarin 7.5 mg x 1 tonight Daily HL, INR and CBC   Violette Morneault D. Melony Tenpas, PharmD, BCPS Clinical Pharmacist Pager: [phone] 08/08/2015 8:43 AM   ADDENDUM To transition to Lovenox in order to facilitate discharge. SCr 0.75, CrCl ~75-50m/min.  Plan: -stop heparin -start Lovenox '1mg'$ /kg ('70mg'$ ) subQ q12h starting at 1100  Gerrie Castiglia D. Ellis Koffler, PharmD, BCPS Clinical Pharmacist Pager: [phone]/25/2017 9:37 AM

## 2015-08-08 NOTE — Discharge Summary (Signed)
Physician Discharge Summary  Timothy Wood AYT:[id number] DOB: 01-13-1941 DOA: 08/05/2015  PCP: [name], DO  Admit date: 08/05/2015 Discharge date: 08/09/15  Admitted From: HOME Disposition:  Home--pt was suppose to go to SNF, but there was difficulty obtaning authorization through insurance company  Recommendations for Outpatient Follow-up:  1. Follow up with PCP in 1-2 weeks 2. Please obtain BMP/CBC 08/12/15 3. Please obtain INR on 08/12/15 and adjust coumadin dose accordingly for INR 2-3 4. Continue lovenox through at least 08/11/15 or until INR >2 which ever is later   Home Health:No Equipment/Devices:No  Discharge Condition:Stable CODE STATUS:FULL Diet recommendation: Heart Healthy  Brief/Interim Summary 75 year old male, widowed, lives alone, PMH of lung cancer status post resection ([name]), HTN, CAD status post remote stent, GERD, provoked DVT after surgery-completed Coumadin, presented to [facility] ED on 08/05/15 after EMS found patient down on the floor at home in a pool of blood. He was preparing a shot of brandy and next thing he remembered was being on the floor. Positive for hitting his head and LOC. EMS provided IV NS 400 mL prior to arrival. Initial BP 62/40 but mentating well. Seen at PCPs office earlier on day of admission for lightheadedness, BP noted to be 90/60 and antihypertensives discontinued. Level I trauma code activated in ED. Extensive imaging and workup in ED including CT head, neck, chest, abdomen and pelvis. CT head and neck without acute findings. Noted compression fractures of L2 and 3 & nonocclusive right lower lobe PE. BP normalized after IV fluid boluses. The patient was started on intravenous heparin and continued until 08/08/2015. On 08/08/2015, the patient was switched to a Lovenox bridge. Warfarin was started on 08/07/2015.  Discharge Diagnoses:   Syncope - Likely secondary to hypotension from antihypertensives, poor oral intake and alcohol  intoxication. - Telemetry without arrhythmia's. Continue monitoring. - 08/06/2015 Echo--EF 65-70 percent, grade 1 daily, no WMA - CT head and C-spine without acute abnormalities.  Hypotension - BP: 62/40 with EMS in the field. 72/43 on arrival in ED. Reported history of intermittent lightheadedness for a year which recently got worse-may have been due to hypotension. - Multifactorial secondary to antihypertensives (took on morning of admission), poor oral intake and alcohol intoxication - Antihypertensives discontinued by PCP earlier on day of admission. Resuscitated with IV fluids. Hypotension resolved. -will not restart irbesartan/HCTZ  Pulmonary embolism - Incidental nonocclusive right lower lobe pulmonary embolism identified on CT chest - Initiated on IV heparin per pharmacy. Coumadin started 08/07/15 - Prior history of provoked DVT for which she completed a course of Coumadin. Has history of lung cancer which may increase risk of hypercoagulable state. - If no recurrent fall risk, ideally should be anticoagulated. Patient prefers Coumadin rather than NOAC's (discussed in detail regarding risks versus benefits of both groups of medication with patient and his daughter).  -Continue coumadin, switch to Lovenox bridge -Plan at least 5 days of Lovenox bridging through 08/11/2015, and plan to discontinue bridging thereafter if INR >2 -Check INR on 08/12/2015 and adjust Coumadin dosing for INR 2-3  Alcohol intoxication - Blood alcohol level 156 on admission. Continue CIWA protocol  Acute kidney injury - Admitted with creatinine of 1.6.  -Resolved after IV fluids.  -Serum creatinine 0.75 at the time of discharge  CAD status post stent - Stent done several years ago. No chest pain reported. Was on aspirin and Plavix at home. Now that anticoagulation is being started, continue plavix without ASA  Lung cancer--adenocarcinoma - Status post wedge resection 01/24/15.  Dr. Algis Liming Discussed  with Dr. Julien Nordmann. -Patient did not require chemotherapy for stage IA adenocarcinoma of the lung  Lumbar compression fractures, L2-3 - Gives history of chronic low back pain which seemed to get slightly worse post fall. - ED had discussed with Dr. Kary Kos, Neurosurgery. Dr. Algis Liming also discussed with him> TLSO brace. PT, OT evaluation and symptomatic treatment. Monitor  Falls/trauma with head injury and lacerations - As per patient and family, has sustained falls a couple of times lately.? Related to hypotension and lightheadedness. - Sutured laceration of right palm, right dorsal upper arm and right parietal scalp by EDP. Tetanus status up-to-date per ED. - Patient was in cervical collar this morning. Discussed with trauma service will have evaluated and cleared cervical spine and collar removed. - PT evaluation-->SNF  Hypoglycemia - likely due to poor po intake in pt with Etoh dependence - may have underlying liver cirrhosis due to chronic Etoh use -Follow CBGs closely and treat as per protocol.  -Placed temporarily on IV D5NS--saline lock and observe-->no further hypoglycemia  Elevated lactate - Secondary to hypotension, acute kidney injury and dehydration. Resolved.  Acute blood loss anemia - Baseline hemoglobin not known. Presented with hemoglobin of 10.2. This is dropped to 8.1. May have been due to blood loss from lacerations and dilution - Follow CBCs closely and transfuse if hemoglobin drops to less than 8 g per DL.  Incidental CT findings - See detailed report below. - Numerous liver lesions, cysts versus necrotic metastatic lesions. - Diffuse gastric wall thickening--suspect Etoh gastritis - Enlarged prostate with diffuse urinary bladder wall thickening - Mixed lytic and sclerotic right iliac bone lesion, metastatic disease is not excluded. Sclerotic appearance of the left scapula. - Apart from significant weight loss since his spouses demise, patient denies GI  symptoms. States he has good appetite, no nausea, vomiting, abdominal pain or change in bowel habits. Denies urinary symptoms. - Appreciate urology, Dr. Georgianne Fick normal with minimal interval change--Very low suspicion of significant prostate cancer--no further workup warranted - Discussed with Dr. Fidela Salisbury, reading radiologist: Liver lesions likely are cysts but cannot be conclusive. Recommend PET/CT-however will need to be pursued as outpatient. -will need GI eval once stable  Hypokalemia -Replete -Check magnesium--1.7-->given 2 grams  Essential Tremor -previously on mysoline-hold for now due to interaction with warfarin -follow up with Dr. Alfonso Patten. [facility] outpt  History of histoplasmosis of the lung -Noted on pathologic examination after which resection -Finished treatment with 6 weeks of itraconazole mid-March 2017  Discharge Instructions      Discharge Instructions    Ambulatory referral to Neurology    Complete by:  As directed   Refer to [facility]  Now off mysoline due to interaction with warfarin     Diet - low sodium heart healthy    Complete by:  As directed      Increase activity slowly    Complete by:  As directed             Medication List    STOP taking these medications        amLODipine 5 MG tablet  Commonly known as:  NORVASC     aspirin 325 MG tablet     irbesartan-hydrochlorothiazide 150-12.5 MG tablet  Commonly known as:  AVALIDE     primidone 50 MG tablet  Commonly known as:  MYSOLINE      TAKE these medications        citalopram 20 MG tablet  Commonly known as:  CELEXA  Take 20 mg by mouth daily.     clopidogrel 75 MG tablet  Commonly known as:  PLAVIX  Take 75 mg by mouth daily.     cyclobenzaprine 10 MG tablet  Commonly known as:  FLEXERIL  Take 10 mg by mouth 2 (two) times daily.     enoxaparin 150 MG/ML injection  Commonly known as:  LOVENOX  Inject 0.45 mLs (70 mg total) into the skin every 12 (twelve) hours.       HYDROcodone-acetaminophen 5-325 MG tablet  Commonly known as:  NORCO/VICODIN  Take 1-2 tablets by mouth every 4 (four) hours as needed for moderate pain.     omeprazole 20 MG capsule  Commonly known as:  PRILOSEC  Take 20 mg by mouth 2 (two) times daily before a meal.     rosuvastatin 40 MG tablet  Commonly known as:  CRESTOR  Take 40 mg by mouth daily.     warfarin 10 MG tablet  Commonly known as:  COUMADIN  Take 1 tablet (10 mg total) by mouth one time only at 6 PM.        Allergies  Allergen Reactions  . Hydrocodone Itching    Consultations: Neurosurgery--Dr. Saintclair Halsted; Urology--Dr. Tresa Moore; Trauma   Procedures/Studies: Ct Head Wo Contrast  08/05/2015  CLINICAL DATA:  Level 2 trauma. Found with laceration to head and altered mental status. Right-sided head injury. History lung cancer. Deep venous thrombosis. Hypertension. EXAM: CT HEAD WITHOUT CONTRAST CT CERVICAL SPINE WITHOUT CONTRAST TECHNIQUE: Multidetector CT imaging of the head and cervical spine was performed following the standard protocol without intravenous contrast. Multiplanar CT image reconstructions of the cervical spine were also generated. COMPARISON:  None. FINDINGS: CT HEAD FINDINGS Sinuses/Soft tissues: High right parietal scalp soft tissue swelling is mild to moderate. No skull fracture. Clear paranasal sinuses and mastoid air cells. Intracranial: No mass lesion, hemorrhage, hydrocephalus, acute infarct, intra-axial, or extra-axial fluid collection. CT CERVICAL SPINE FINDINGS Spinal visualization through the bottom of T1. Prevertebral soft tissues are within normal limits. No apical pneumothorax. Skull base intact. Expected for age cervical spondylosis with endplate osteophytes and loss of intervertebral disc height at C5-6. Facets are well-aligned. Degenerative irregularity surrounds the tip of the odontoid process, without donor site. IMPRESSION: 1. Right-sided scalp soft tissue swelling, without acute intracranial  abnormality. 2. Cervical spondylosis, without acute fracture or subluxation. Electronically Signed   By: Abigail Miyamoto M.D.   On: 08/05/2015 20:43   Ct Chest W Contrast  08/05/2015  CLINICAL DATA:  Found with laceration to the head and loss of consciousness. History of lung cancer. EXAM: CT CHEST, ABDOMEN, AND PELVIS WITH CONTRAST TECHNIQUE: Multidetector CT imaging of the chest, abdomen and pelvis was performed following the standard protocol during bolus administration of intravenous contrast. CONTRAST:  13m ISOVUE-300 IOPAMIDOL (ISOVUE-300) INJECTION 61% COMPARISON:  None. FINDINGS: CT CHEST FINDINGS Mediastinum/Lymph Nodes: No masses, or pathologically enlarged lymph nodes. The heart is normal in size. There is calcified atherosclerotic disease of the coronary arteries. There is tortuosity of the aorta with significant calcified and noncalcified plaque throughout its course. There is a nonocclusive pulmonary embolus within right lower lobar pulmonary artery, extending into its segmental basilar branches. Lungs/Pleura: There is partially calcified posterior right upper lobe and middle lobe probably postsurgical scarring. Musculoskeletal: No chest wall mass or suspicious bone lesions identified. CT ABDOMEN PELVIS FINDINGS Hepatobiliary: There are numerous hypoattenuated lesions throughout the liver, the larger of which measure water density. Pancreas: No mass, inflammatory changes, or other  significant abnormality. Spleen: Within normal limits in size and appearance. Adrenals/Urinary Tract: No masses identified. No evidence of hydronephrosis. There are bilateral partially exophytic large renal cysts, measuring up to 5 cm in the left lower pole. Stomach/Bowel: There is diffuse thickening of the gastric wall. No evidence of small-bowel obstruction. Vascular/Lymphatic: No pathologically enlarged lymph nodes. No evidence of abdominal aortic aneurysm. Atherosclerotic disease with calcified and noncalcified plaque  is noted. Reproductive: The prostate gland is enlarged and globular with diffuse thickening of the urinary bladder wall. Other: None. Musculoskeletal: There is an acute fracture of the superior endplates of L2 and L3 vertebral bodies with mild grade 1 compression deformity. There are subacute healing right-sided rib fractures. There is a mixed lytic and sclerotic appearing lesion within the right iliac bone measuring approximately 2.9 cm. There is a sclerotic appearance of the shaft of the left scapula. IMPRESSION: Nonocclusive right lower lobar pulmonary embolus. Numerous hypoattenuated lesions throughout the liver, which measure water density, and by CT criteria represent cysts. Alternatively these may represent necrotic metastatic lesions. Simple appearing renal cysts. Diffuse gastric wall thickening. Direct visualization may be considered as malignancy cannot be excluded. Enlarged globular prostate gland with likely secondary diffuse urinary bladder wall thickening. Please correlate to serum PSA values. Acute superior endplate L2 and L3 vertebral body fractures with grade 1 compression deformities and no alignment abnormality. Mixed lytic and sclerotic right iliac bone lesion ; metastatic disease cannot be excluded. Sclerotic appearance of the left scapula. Likely postsurgical scarring within the right upper and middle lobes of the lung. Advanced calcified and noncalcified atherosclerotic disease of the aorta. These results were called by telephone at the time of interpretation on 08/05/2015 at 9:05 pm to Dr. Tammy Sours , who verbally acknowledged these results. Electronically Signed   By: Fidela Salisbury M.D.   On: 08/05/2015 21:05   Ct Cervical Spine Wo Contrast  08/05/2015  CLINICAL DATA:  Level 2 trauma. Found with laceration to head and altered mental status. Right-sided head injury. History lung cancer. Deep venous thrombosis. Hypertension. EXAM: CT HEAD WITHOUT CONTRAST CT CERVICAL SPINE WITHOUT  CONTRAST TECHNIQUE: Multidetector CT imaging of the head and cervical spine was performed following the standard protocol without intravenous contrast. Multiplanar CT image reconstructions of the cervical spine were also generated. COMPARISON:  None. FINDINGS: CT HEAD FINDINGS Sinuses/Soft tissues: High right parietal scalp soft tissue swelling is mild to moderate. No skull fracture. Clear paranasal sinuses and mastoid air cells. Intracranial: No mass lesion, hemorrhage, hydrocephalus, acute infarct, intra-axial, or extra-axial fluid collection. CT CERVICAL SPINE FINDINGS Spinal visualization through the bottom of T1. Prevertebral soft tissues are within normal limits. No apical pneumothorax. Skull base intact. Expected for age cervical spondylosis with endplate osteophytes and loss of intervertebral disc height at C5-6. Facets are well-aligned. Degenerative irregularity surrounds the tip of the odontoid process, without donor site. IMPRESSION: 1. Right-sided scalp soft tissue swelling, without acute intracranial abnormality. 2. Cervical spondylosis, without acute fracture or subluxation. Electronically Signed   By: Abigail Miyamoto M.D.   On: 08/05/2015 20:43   Ct Abdomen Pelvis W Contrast  08/05/2015  CLINICAL DATA:  Found with laceration to the head and loss of consciousness. History of lung cancer. EXAM: CT CHEST, ABDOMEN, AND PELVIS WITH CONTRAST TECHNIQUE: Multidetector CT imaging of the chest, abdomen and pelvis was performed following the standard protocol during bolus administration of intravenous contrast. CONTRAST:  185m ISOVUE-300 IOPAMIDOL (ISOVUE-300) INJECTION 61% COMPARISON:  None. FINDINGS: CT CHEST FINDINGS Mediastinum/Lymph Nodes: No masses, or  pathologically enlarged lymph nodes. The heart is normal in size. There is calcified atherosclerotic disease of the coronary arteries. There is tortuosity of the aorta with significant calcified and noncalcified plaque throughout its course. There is a  nonocclusive pulmonary embolus within right lower lobar pulmonary artery, extending into its segmental basilar branches. Lungs/Pleura: There is partially calcified posterior right upper lobe and middle lobe probably postsurgical scarring. Musculoskeletal: No chest wall mass or suspicious bone lesions identified. CT ABDOMEN PELVIS FINDINGS Hepatobiliary: There are numerous hypoattenuated lesions throughout the liver, the larger of which measure water density. Pancreas: No mass, inflammatory changes, or other significant abnormality. Spleen: Within normal limits in size and appearance. Adrenals/Urinary Tract: No masses identified. No evidence of hydronephrosis. There are bilateral partially exophytic large renal cysts, measuring up to 5 cm in the left lower pole. Stomach/Bowel: There is diffuse thickening of the gastric wall. No evidence of small-bowel obstruction. Vascular/Lymphatic: No pathologically enlarged lymph nodes. No evidence of abdominal aortic aneurysm. Atherosclerotic disease with calcified and noncalcified plaque is noted. Reproductive: The prostate gland is enlarged and globular with diffuse thickening of the urinary bladder wall. Other: None. Musculoskeletal: There is an acute fracture of the superior endplates of L2 and L3 vertebral bodies with mild grade 1 compression deformity. There are subacute healing right-sided rib fractures. There is a mixed lytic and sclerotic appearing lesion within the right iliac bone measuring approximately 2.9 cm. There is a sclerotic appearance of the shaft of the left scapula. IMPRESSION: Nonocclusive right lower lobar pulmonary embolus. Numerous hypoattenuated lesions throughout the liver, which measure water density, and by CT criteria represent cysts. Alternatively these may represent necrotic metastatic lesions. Simple appearing renal cysts. Diffuse gastric wall thickening. Direct visualization may be considered as malignancy cannot be excluded. Enlarged globular  prostate gland with likely secondary diffuse urinary bladder wall thickening. Please correlate to serum PSA values. Acute superior endplate L2 and L3 vertebral body fractures with grade 1 compression deformities and no alignment abnormality. Mixed lytic and sclerotic right iliac bone lesion ; metastatic disease cannot be excluded. Sclerotic appearance of the left scapula. Likely postsurgical scarring within the right upper and middle lobes of the lung. Advanced calcified and noncalcified atherosclerotic disease of the aorta. These results were called by telephone at the time of interpretation on 08/05/2015 at 9:05 pm to Dr. Tammy Sours , who verbally acknowledged these results. Electronically Signed   By: Fidela Salisbury M.D.   On: 08/05/2015 21:05   Dg Chest Portable 1 View  08/05/2015  CLINICAL DATA:  Status post level 2 trauma.  Initial encounter. EXAM: PORTABLE CHEST 1 VIEW COMPARISON:  None. FINDINGS: The lungs are well-aerated and clear. There is no evidence of focal opacification, pleural effusion or pneumothorax. Postoperative change is noted at the right midlung. The cardiomediastinal silhouette is within normal limits. No acute osseous abnormalities are seen. IMPRESSION: No acute cardiopulmonary process seen. No displaced rib fractures identified. Electronically Signed   By: Garald Balding M.D.   On: 08/05/2015 19:48   Dg Hand Complete Right  08/05/2015  CLINICAL DATA:  Right hand laceration. EXAM: RIGHT HAND - COMPLETE 3+ VIEW COMPARISON:  None. FINDINGS: There is no evidence of fracture or dislocation. Narrowing and sclerosis of the first carpometacarpal joint is noted as well as the second and third distal interphalangeal joints. Soft tissues are unremarkable. IMPRESSION: Findings consistent with osteoarthritis. No acute abnormality or radiopaque foreign body seen in the right hand. Electronically Signed   By: Marijo Conception, M.D.  On: 08/05/2015 20:14         Discharge Exam: Filed  Vitals:   08/08/15 2114 08/09/15 0445  BP: 133/59 128/69  Pulse: 73 78  Temp: 97.8 F (36.6 C) 98.4 F (36.9 C)  Resp: 17 18   Filed Vitals:   08/08/15 0754 08/08/15 1632 08/08/15 2114 08/09/15 0445  BP: 135/61 128/63 133/59 128/69  Pulse: 67 81 73 78  Temp: 97.9 F (36.6 C) 97.6 F (36.4 C) 97.8 F (36.6 C) 98.4 F (36.9 C)  TempSrc: Oral Oral Oral Oral  Resp: '18 18 17 18  '$ Height:      Weight:   65.908 kg (145 lb 4.8 oz)   SpO2: 98% 99% 100% 99%    General: Pt is alert, awake, not in acute distress Cardiovascular: RRR, S1/S2 +, no rubs, no gallops Respiratory: Bibasilar crackles without wheezing. Good air movement Abdominal: Soft, NT, ND, bowel sounds + Extremities: no edema, no cyanosis   The results of significant diagnostics from this hospitalization (including imaging, microbiology, ancillary and laboratory) are listed below for reference.    Significant Diagnostic Studies: Ct Head Wo Contrast  08/05/2015  CLINICAL DATA:  Level 2 trauma. Found with laceration to head and altered mental status. Right-sided head injury. History lung cancer. Deep venous thrombosis. Hypertension. EXAM: CT HEAD WITHOUT CONTRAST CT CERVICAL SPINE WITHOUT CONTRAST TECHNIQUE: Multidetector CT imaging of the head and cervical spine was performed following the standard protocol without intravenous contrast. Multiplanar CT image reconstructions of the cervical spine were also generated. COMPARISON:  None. FINDINGS: CT HEAD FINDINGS Sinuses/Soft tissues: High right parietal scalp soft tissue swelling is mild to moderate. No skull fracture. Clear paranasal sinuses and mastoid air cells. Intracranial: No mass lesion, hemorrhage, hydrocephalus, acute infarct, intra-axial, or extra-axial fluid collection. CT CERVICAL SPINE FINDINGS Spinal visualization through the bottom of T1. Prevertebral soft tissues are within normal limits. No apical pneumothorax. Skull base intact. Expected for age cervical spondylosis  with endplate osteophytes and loss of intervertebral disc height at C5-6. Facets are well-aligned. Degenerative irregularity surrounds the tip of the odontoid process, without donor site. IMPRESSION: 1. Right-sided scalp soft tissue swelling, without acute intracranial abnormality. 2. Cervical spondylosis, without acute fracture or subluxation. Electronically Signed   By: Abigail Miyamoto M.D.   On: 08/05/2015 20:43   Ct Chest W Contrast  08/05/2015  CLINICAL DATA:  Found with laceration to the head and loss of consciousness. History of lung cancer. EXAM: CT CHEST, ABDOMEN, AND PELVIS WITH CONTRAST TECHNIQUE: Multidetector CT imaging of the chest, abdomen and pelvis was performed following the standard protocol during bolus administration of intravenous contrast. CONTRAST:  129m ISOVUE-300 IOPAMIDOL (ISOVUE-300) INJECTION 61% COMPARISON:  None. FINDINGS: CT CHEST FINDINGS Mediastinum/Lymph Nodes: No masses, or pathologically enlarged lymph nodes. The heart is normal in size. There is calcified atherosclerotic disease of the coronary arteries. There is tortuosity of the aorta with significant calcified and noncalcified plaque throughout its course. There is a nonocclusive pulmonary embolus within right lower lobar pulmonary artery, extending into its segmental basilar branches. Lungs/Pleura: There is partially calcified posterior right upper lobe and middle lobe probably postsurgical scarring. Musculoskeletal: No chest wall mass or suspicious bone lesions identified. CT ABDOMEN PELVIS FINDINGS Hepatobiliary: There are numerous hypoattenuated lesions throughout the liver, the larger of which measure water density. Pancreas: No mass, inflammatory changes, or other significant abnormality. Spleen: Within normal limits in size and appearance. Adrenals/Urinary Tract: No masses identified. No evidence of hydronephrosis. There are bilateral partially exophytic large renal  cysts, measuring up to 5 cm in the left lower pole.  Stomach/Bowel: There is diffuse thickening of the gastric wall. No evidence of small-bowel obstruction. Vascular/Lymphatic: No pathologically enlarged lymph nodes. No evidence of abdominal aortic aneurysm. Atherosclerotic disease with calcified and noncalcified plaque is noted. Reproductive: The prostate gland is enlarged and globular with diffuse thickening of the urinary bladder wall. Other: None. Musculoskeletal: There is an acute fracture of the superior endplates of L2 and L3 vertebral bodies with mild grade 1 compression deformity. There are subacute healing right-sided rib fractures. There is a mixed lytic and sclerotic appearing lesion within the right iliac bone measuring approximately 2.9 cm. There is a sclerotic appearance of the shaft of the left scapula. IMPRESSION: Nonocclusive right lower lobar pulmonary embolus. Numerous hypoattenuated lesions throughout the liver, which measure water density, and by CT criteria represent cysts. Alternatively these may represent necrotic metastatic lesions. Simple appearing renal cysts. Diffuse gastric wall thickening. Direct visualization may be considered as malignancy cannot be excluded. Enlarged globular prostate gland with likely secondary diffuse urinary bladder wall thickening. Please correlate to serum PSA values. Acute superior endplate L2 and L3 vertebral body fractures with grade 1 compression deformities and no alignment abnormality. Mixed lytic and sclerotic right iliac bone lesion ; metastatic disease cannot be excluded. Sclerotic appearance of the left scapula. Likely postsurgical scarring within the right upper and middle lobes of the lung. Advanced calcified and noncalcified atherosclerotic disease of the aorta. These results were called by telephone at the time of interpretation on 08/05/2015 at 9:05 pm to Dr. Tammy Sours , who verbally acknowledged these results. Electronically Signed   By: Fidela Salisbury M.D.   On: 08/05/2015 21:05   Ct  Cervical Spine Wo Contrast  08/05/2015  CLINICAL DATA:  Level 2 trauma. Found with laceration to head and altered mental status. Right-sided head injury. History lung cancer. Deep venous thrombosis. Hypertension. EXAM: CT HEAD WITHOUT CONTRAST CT CERVICAL SPINE WITHOUT CONTRAST TECHNIQUE: Multidetector CT imaging of the head and cervical spine was performed following the standard protocol without intravenous contrast. Multiplanar CT image reconstructions of the cervical spine were also generated. COMPARISON:  None. FINDINGS: CT HEAD FINDINGS Sinuses/Soft tissues: High right parietal scalp soft tissue swelling is mild to moderate. No skull fracture. Clear paranasal sinuses and mastoid air cells. Intracranial: No mass lesion, hemorrhage, hydrocephalus, acute infarct, intra-axial, or extra-axial fluid collection. CT CERVICAL SPINE FINDINGS Spinal visualization through the bottom of T1. Prevertebral soft tissues are within normal limits. No apical pneumothorax. Skull base intact. Expected for age cervical spondylosis with endplate osteophytes and loss of intervertebral disc height at C5-6. Facets are well-aligned. Degenerative irregularity surrounds the tip of the odontoid process, without donor site. IMPRESSION: 1. Right-sided scalp soft tissue swelling, without acute intracranial abnormality. 2. Cervical spondylosis, without acute fracture or subluxation. Electronically Signed   By: Abigail Miyamoto M.D.   On: 08/05/2015 20:43   Ct Abdomen Pelvis W Contrast  08/05/2015  CLINICAL DATA:  Found with laceration to the head and loss of consciousness. History of lung cancer. EXAM: CT CHEST, ABDOMEN, AND PELVIS WITH CONTRAST TECHNIQUE: Multidetector CT imaging of the chest, abdomen and pelvis was performed following the standard protocol during bolus administration of intravenous contrast. CONTRAST:  170m ISOVUE-300 IOPAMIDOL (ISOVUE-300) INJECTION 61% COMPARISON:  None. FINDINGS: CT CHEST FINDINGS Mediastinum/Lymph Nodes:  No masses, or pathologically enlarged lymph nodes. The heart is normal in size. There is calcified atherosclerotic disease of the coronary arteries. There is tortuosity of the aorta  with significant calcified and noncalcified plaque throughout its course. There is a nonocclusive pulmonary embolus within right lower lobar pulmonary artery, extending into its segmental basilar branches. Lungs/Pleura: There is partially calcified posterior right upper lobe and middle lobe probably postsurgical scarring. Musculoskeletal: No chest wall mass or suspicious bone lesions identified. CT ABDOMEN PELVIS FINDINGS Hepatobiliary: There are numerous hypoattenuated lesions throughout the liver, the larger of which measure water density. Pancreas: No mass, inflammatory changes, or other significant abnormality. Spleen: Within normal limits in size and appearance. Adrenals/Urinary Tract: No masses identified. No evidence of hydronephrosis. There are bilateral partially exophytic large renal cysts, measuring up to 5 cm in the left lower pole. Stomach/Bowel: There is diffuse thickening of the gastric wall. No evidence of small-bowel obstruction. Vascular/Lymphatic: No pathologically enlarged lymph nodes. No evidence of abdominal aortic aneurysm. Atherosclerotic disease with calcified and noncalcified plaque is noted. Reproductive: The prostate gland is enlarged and globular with diffuse thickening of the urinary bladder wall. Other: None. Musculoskeletal: There is an acute fracture of the superior endplates of L2 and L3 vertebral bodies with mild grade 1 compression deformity. There are subacute healing right-sided rib fractures. There is a mixed lytic and sclerotic appearing lesion within the right iliac bone measuring approximately 2.9 cm. There is a sclerotic appearance of the shaft of the left scapula. IMPRESSION: Nonocclusive right lower lobar pulmonary embolus. Numerous hypoattenuated lesions throughout the liver, which measure  water density, and by CT criteria represent cysts. Alternatively these may represent necrotic metastatic lesions. Simple appearing renal cysts. Diffuse gastric wall thickening. Direct visualization may be considered as malignancy cannot be excluded. Enlarged globular prostate gland with likely secondary diffuse urinary bladder wall thickening. Please correlate to serum PSA values. Acute superior endplate L2 and L3 vertebral body fractures with grade 1 compression deformities and no alignment abnormality. Mixed lytic and sclerotic right iliac bone lesion ; metastatic disease cannot be excluded. Sclerotic appearance of the left scapula. Likely postsurgical scarring within the right upper and middle lobes of the lung. Advanced calcified and noncalcified atherosclerotic disease of the aorta. These results were called by telephone at the time of interpretation on 08/05/2015 at 9:05 pm to Dr. Tammy Sours , who verbally acknowledged these results. Electronically Signed   By: Fidela Salisbury M.D.   On: 08/05/2015 21:05   Dg Chest Portable 1 View  08/05/2015  CLINICAL DATA:  Status post level 2 trauma.  Initial encounter. EXAM: PORTABLE CHEST 1 VIEW COMPARISON:  None. FINDINGS: The lungs are well-aerated and clear. There is no evidence of focal opacification, pleural effusion or pneumothorax. Postoperative change is noted at the right midlung. The cardiomediastinal silhouette is within normal limits. No acute osseous abnormalities are seen. IMPRESSION: No acute cardiopulmonary process seen. No displaced rib fractures identified. Electronically Signed   By: Garald Balding M.D.   On: 08/05/2015 19:48   Dg Hand Complete Right  08/05/2015  CLINICAL DATA:  Right hand laceration. EXAM: RIGHT HAND - COMPLETE 3+ VIEW COMPARISON:  None. FINDINGS: There is no evidence of fracture or dislocation. Narrowing and sclerosis of the first carpometacarpal joint is noted as well as the second and third distal interphalangeal joints.  Soft tissues are unremarkable. IMPRESSION: Findings consistent with osteoarthritis. No acute abnormality or radiopaque foreign body seen in the right hand. Electronically Signed   By: Marijo Conception, M.D.   On: 08/05/2015 20:14     Microbiology: Recent Results (from the past 240 hour(s))  Urine culture     Status: Abnormal  Collection Time: 08/05/15 11:48 PM  Result Value Ref Range Status   Specimen Description URINE, RANDOM  Final   Special Requests NONE  Final   Culture MULTIPLE SPECIES PRESENT, SUGGEST RECOLLECTION (A)  Final   Report Status 08/07/2015 FINAL  Final     Labs: Basic Metabolic Panel:  Recent Labs Lab 08/05/15 1934 08/06/15 0333 08/07/15 0212 08/08/15 0540 08/09/15 0437  NA 134* 136 135 137 137  K 4.5 3.8 3.4* 3.6 3.9  CL 103 105 107 107 106  CO2 19* '25 23 25 25  '$ GLUCOSE 103* 153* 143* 101* 96  BUN '18 14 11 '$ <5* 5*  CREATININE 1.66* 1.08 0.94 0.75 0.76  CALCIUM 8.2* 8.2* 7.8* 8.5* 8.4*  MG  --   --   --  1.7  --    Liver Function Tests:  Recent Labs Lab 08/05/15 1934  AST 28  ALT 15*  ALKPHOS 72  BILITOT 0.9  PROT 5.3*  ALBUMIN 3.2*   No results for input(s): LIPASE, AMYLASE in the last 168 hours. No results for input(s): AMMONIA in the last 168 hours. CBC:  Recent Labs Lab 08/06/15 0333  08/06/15 1412 08/06/15 2030 08/07/15 0212 08/08/15 0540 08/09/15 0437  WBC 7.0  --   --  4.7 4.2 4.4 4.6  HGB 9.2*  < > 8.1* 8.4* 7.9* 8.2* 8.4*  HCT 29.1*  --   --  26.6* 25.2* 26.0* 26.4*  MCV 92.1  --   --  92.0 92.6 93.2 91.7  PLT 204  --   --  187 183 188 210  < > = values in this interval not displayed. Cardiac Enzymes: No results for input(s): CKTOTAL, CKMB, CKMBINDEX, TROPONINI in the last 168 hours. BNP: Invalid input(s): POCBNP CBG:  Recent Labs Lab 08/08/15 0744 08/08/15 1219 08/08/15 1631 08/08/15 2118 08/09/15 0733  GLUCAP 100* 115* 108* 113* 98    Time coordinating discharge:  Greater than 30 minutes  Signed:  Immanuel Fedak,  Jarryd Gratz, DO [facility] Pager: [phone] 08/09/2015, 11:54 AM

## 2015-08-08 NOTE — Progress Notes (Signed)
Physical Therapy Treatment Patient Details Name: ORI TREJOS MRN: [id number] DOB: 1940-05-31 Today's Date: 08/08/2015    History of Present Illness 75 yo male with onset of PE and then found to have compression wedge fractures of L2 and L3 with a fall history.  Pt states he has continual dizziness issues.  Has PMHx:  lung CA, possible mets, DVT, HTN    PT Comments    Pt eager for ambulation, but needs cues for safety and attending to balance.  Pt with LOB x3 without realization and one LOB required MinA to prevent fall.  Feel pt would benefit from continued therapies prior to returning to home.    Follow Up Recommendations  SNF     Equipment Recommendations  None recommended by PT    Recommendations for Other Services       Precautions / Restrictions Precautions Precautions: Fall Required Braces or Orthoses: Spinal Brace Spinal Brace: Thoracolumbosacral orthotic;Applied in sitting position (No order, but written in MD note.) Restrictions Weight Bearing Restrictions: No    Mobility  Bed Mobility Overal bed mobility: Needs Assistance Bed Mobility: Rolling;Sidelying to Sit Rolling: Supervision Sidelying to sit: Min guard       General bed mobility comments: Heavy use of UEs and bed rail.  Increased time needed.  Transfers Overall transfer level: Needs assistance Equipment used: Rolling walker (2 wheeled) Transfers: Sit to/from Stand Sit to Stand: Min guard         General transfer comment: cues for UE use and slowing down.  pt denied dizziness today.  Ambulation/Gait Ambulation/Gait assistance: Min assist Ambulation Distance (Feet): 175 Feet Assistive device: Rolling walker (2 wheeled) Gait Pattern/deviations: Step-through pattern;Decreased stride length;Narrow base of support     General Gait Details: pt needs cueing for safe use of RW as he tends to step outside RW and picks it up at times.  pt with LOB x3 with one requiring MinA to prevent fall.  pt  with no awareness of balance deficits.     Stairs            Wheelchair Mobility    Modified Rankin (Stroke Patients Only)       Balance Overall balance assessment: Needs assistance Sitting-balance support: Feet supported;No upper extremity supported Sitting balance-Leahy Scale: Good     Standing balance support: Bilateral upper extremity supported;During functional activity Standing balance-Leahy Scale: Fair                      Cognition Arousal/Alertness: Awake/alert Behavior During Therapy: WFL for tasks assessed/performed Overall Cognitive Status: No family/caregiver present to determine baseline cognitive functioning                 General Comments: Unclear pt's baseline level of cognition, but he does have a poor awareness of deficits and safety.  pt picking up RW when turning and falling towards PT, but when PT asked pt about it he states he was fine despite needing A to prevent fall.      Exercises      General Comments        Pertinent Vitals/Pain Pain Assessment: 0-10 Pain Score: 5  Pain Location: Back when getting out of bed. Pain Descriptors / Indicators: Aching;Grimacing;Guarding Pain Intervention(s): Monitored during session;Premedicated before session;Repositioned    Home Living                      Prior Function  PT Goals (current goals can now be found in the care plan section) Acute Rehab PT Goals Patient Stated Goal: to be safe PT Goal Formulation: With patient Time For Goal Achievement: 08/21/15 Potential to Achieve Goals: Good Progress towards PT goals: Progressing toward goals    Frequency  Min 3X/week    PT Plan Current plan remains appropriate    Co-evaluation             End of Session Equipment Utilized During Treatment: Gait belt;Back brace Activity Tolerance: Patient tolerated treatment well Patient left: in chair;with call bell/phone within reach;with nursing/sitter in  room     Time: 0093-8182 PT Time Calculation (min) (ACUTE ONLY): 19 min  Charges:  $Gait Training: 8-22 mins                    G CodesCatarina Hartshorn, [place] 08/08/2015, 12:14 PM

## 2015-08-08 NOTE — Progress Notes (Signed)
PROGRESS NOTE  Timothy Wood YKZ:[id number] DOB: 1940/08/06 DOA: 08/05/2015 PCP: [name], DO  Brief History:  75 year old male, widowed, lives alone, PMH of lung cancer status post resection ([name]), HTN, CAD status post remote stent, GERD, provoked DVT after surgery-completed Coumadin, presented to [facility] ED on 08/05/15 after EMS found patient down on the floor at home in a pool of blood. He was preparing a shot of brandy and next thing he remembered was being on the floor. Positive for hitting his head and LOC. EMS provided IV NS 400 mL prior to arrival. Initial BP 62/40 but mentating well. Seen at PCPs office earlier on day of admission for lightheadedness, BP noted to be 90/60 and antihypertensives discontinued. Level I trauma code activated in ED. Extensive imaging and workup in ED including CT head, neck, chest, abdomen and pelvis. CT head and neck without acute findings. Noted compression fractures of L2 and 3 & nonocclusive right lower lobe PE. BP normalized after IV fluid boluses.  Assessment/Plan: Syncope - Likely secondary to hypotension from antihypertensives, poor oral intake and alcohol intoxication. - Telemetry without arrhythmia's. Continue monitoring. - 08/06/2015 Echo--EF 65-70 percent, grade 1 daily, no WMA - CT head and C-spine without acute abnormalities.  Hypotension - BP: 62/40 with EMS in the field. 72/43 on arrival in ED. Reported history of intermittent lightheadedness for a year which recently got worse-may have been due to hypotension. - Multifactorial secondary to antihypertensives (took on morning of admission), poor oral intake and alcohol intoxication - Antihypertensives discontinued by PCP earlier on day of admission. Resuscitated with IV fluids. Hypotension resolved.  Pulmonary embolism - Incidental nonocclusive right lower lobe pulmonary embolism identified on CT chest - Initiated on IV heparin per pharmacy.  Coumadin started 08/07/15 -  Prior history of provoked DVT for which she completed a course of Coumadin. Has history of lung cancer which may increase risk of hypercoagulable state. - If no recurrent fall risk, ideally should be anticoagulated. Patient prefers Coumadin rather than NOAC's (discussed in detail regarding risks versus benefits of both groups of medication with patient and his daughter).  -Continue coumadin, switch to Lovenox bridge  Alcohol intoxication - Blood alcohol level 156 on admission. Continue CIWA protocol  Acute kidney injury - Admitted with creatinine of 1.6.  -Resolved after IV fluids.   CAD status post stent - Stent done several years ago. No chest pain reported. Was on aspirin and Plavix at home. Now that anticoagulation is being started, may have to continue aspirin alone without Plavix.   Lung cancer--adenocarcinoma - Status post wedge resection 01/24/15.  Dr. Algis Liming Discussed with Dr. Julien Nordmann.  Lumbar compression fractures, L2-3 - Gives history of chronic low back pain which seemed to get slightly worse post fall. - ED had discussed with Dr. Kary Kos, Neurosurgery. Dr. Algis Liming also discussed with him> TLSO brace. PT, OT evaluation and symptomatic treatment. Monitor  Falls/trauma with head injury and lacerations - As per patient and family, has sustained falls a couple of times lately.? Related to hypotension and lightheadedness. - Sutured laceration of right palm, right dorsal upper arm and right parietal scalp by EDP. Tetanus status up-to-date per ED. - Patient was in cervical collar this morning. Discussed with trauma service will have evaluated and cleared cervical spine and collar removed. - PT evaluation-->SNF  Hypoglycemia - likely due to poor po intake in pt with Etoh dependence - may have underlying liver cirrhosis due to chronic Etoh  use -Follow CBGs closely and treat as per protocol.  -Placed temporarily on IV D5NS--saline lock and observe-->no further  hypoglycemia  Elevated lactate - Secondary to hypotension, acute kidney injury and dehydration. Resolved.  Acute blood loss anemia - Baseline hemoglobin not known. Presented with hemoglobin of 10.2. This is dropped to 8.1. May have been due to blood loss from lacerations and dilution - Follow CBCs closely and transfuse if hemoglobin drops to less than 8 g per DL.  Incidental CT findings - See detailed report below. - Numerous liver lesions, cysts versus necrotic metastatic lesions. - Diffuse gastric wall thickening--suspect Etoh gastritis - Enlarged prostate with diffuse urinary bladder wall thickening - Mixed lytic and sclerotic right iliac bone lesion, metastatic disease is not excluded. Sclerotic appearance of the left scapula. - Apart from significant weight loss since his spouses demise, patient denies GI symptoms. States he has good appetite, no nausea, vomiting, abdominal pain or change in bowel habits. Denies urinary symptoms. - Appreciate urology, Dr. Georgianne Fick normal with minimal interval change--Very low suspicion of significant prostate cancer--no further workup warranted - Discussed with Dr. Fidela Salisbury, reading radiologist: Liver lesions likely are cysts but cannot be conclusive. Recommend PET/CT-however will need to be pursued as outpatient. -will need GI eval once stable  Hypokalemia -Replete -Check magnesium--1.7-->give 2 grams  Essential Tremor -previously on mysoline  -follow up with Dr. Alfonso Patten. Stephania Macfarlane outpt   Disposition Plan: SNF 5/26 Family Communication: updated daughter at bedside 5/25  Consultants: Neurosurgery--Dr. Saintclair Halsted; Urology--Dr. Tresa Moore; Trauma  Code Status: FULL  Subjective: Patient denies fevers, chills, headache, chest pain, dyspnea, nausea, vomiting, diarrhea, abdominal pain, dysuria, hematuria   Objective: Filed Vitals:   08/07/15 1813 08/07/15 2030 08/08/15 0458 08/08/15 0754  BP: 136/60 131/60 137/62 135/61  Pulse: 83 78 73 67   Temp: 98 F (36.7 C) 98.1 F (36.7 C) 97.7 F (36.5 C) 97.9 F (36.6 C)  TempSrc: Oral Oral Oral Oral  Resp: '18 16 18 18  '$ Height:      Weight:  67.9 kg (149 lb 11.1 oz)    SpO2: 99% 98% 97% 98%    Intake/Output Summary (Last 24 hours) at 08/08/15 1627 Last data filed at 08/08/15 1500  Gross per 24 hour  Intake   1440 ml  Output   1635 ml  Net   -195 ml   Weight change: 2 kg (4 lb 6.6 oz) Exam:   General:  Pt is alert, follows commands appropriately, not in acute distress  HEENT: No icterus, No thrush, No neck mass, [place]/AT  Cardiovascular: RRR, S1/S2, no rubs, no gallops  Respiratory: Diminished breath sounds at the bases. Bibasilar crackles without wheezing.  Abdomen: Soft/+BS, non tender, non distended, no guarding  Extremities: No edema, No lymphangitis, No petechiae, No rashes, no synovitis   Data Reviewed: I have personally reviewed following labs and imaging studies Basic Metabolic Panel:  Recent Labs Lab 08/05/15 1934 08/06/15 0333 08/07/15 0212 08/08/15 0540  NA 134* 136 135 137  K 4.5 3.8 3.4* 3.6  CL 103 105 107 107  CO2 19* '25 23 25  '$ GLUCOSE 103* 153* 143* 101*  BUN '18 14 11 '$ <5*  CREATININE 1.66* 1.08 0.94 0.75  CALCIUM 8.2* 8.2* 7.8* 8.5*  MG  --   --   --  1.7   Liver Function Tests:  Recent Labs Lab 08/05/15 1934  AST 28  ALT 15*  ALKPHOS 72  BILITOT 0.9  PROT 5.3*  ALBUMIN 3.2*   No results for input(s):  LIPASE, AMYLASE in the last 168 hours. No results for input(s): AMMONIA in the last 168 hours. Coagulation Profile:  Recent Labs Lab 08/05/15 1934 08/08/15 0540  INR 1.15 1.15   CBC:  Recent Labs Lab 08/05/15 2115 08/06/15 0007 08/06/15 0333 08/06/15 0526 08/06/15 1412 08/06/15 2030 08/07/15 0212 08/08/15 0540  WBC 7.8  --  7.0  --   --  4.7 4.2 4.4  HGB 10.2* 10.2* 9.2* 9.3* 8.1* 8.4* 7.9* 8.2*  HCT 32.7* 32.9* 29.1*  --   --  26.6* 25.2* 26.0*  MCV 92.9  --  92.1  --   --  92.0 92.6 93.2  PLT 189  --  204   --   --  187 183 188   Cardiac Enzymes: No results for input(s): CKTOTAL, CKMB, CKMBINDEX, TROPONINI in the last 168 hours. BNP: Invalid input(s): POCBNP CBG:  Recent Labs Lab 08/07/15 1152 08/07/15 1653 08/07/15 2028 08/08/15 0744 08/08/15 1219  GLUCAP 114* 106* 129* 100* 115*   HbA1C: No results for input(s): HGBA1C in the last 72 hours. Urine analysis:    Component Value Date/Time   COLORURINE YELLOW 08/05/2015 2348   APPEARANCEUR CLEAR 08/05/2015 2348   LABSPEC 1.024 08/05/2015 2348   PHURINE 5.0 08/05/2015 2348   GLUCOSEU NEGATIVE 08/05/2015 2348   HGBUR SMALL* 08/05/2015 2348   BILIRUBINUR NEGATIVE 08/05/2015 2348   KETONESUR NEGATIVE 08/05/2015 2348   PROTEINUR NEGATIVE 08/05/2015 2348   NITRITE POSITIVE* 08/05/2015 2348   LEUKOCYTESUR NEGATIVE 08/05/2015 2348   Sepsis Labs: '@LABRCNTIP'$ (procalcitonin:4,lacticidven:4) ) Recent Results (from the past 240 hour(s))  Urine culture     Status: Abnormal   Collection Time: 08/05/15 11:48 PM  Result Value Ref Range Status   Specimen Description URINE, RANDOM  Final   Special Requests NONE  Final   Culture MULTIPLE SPECIES PRESENT, SUGGEST RECOLLECTION (A)  Final   Report Status 08/07/2015 FINAL  Final     Scheduled Meds: . aspirin EC  81 mg Oral Daily  . citalopram  20 mg Oral Daily  . cyclobenzaprine  10 mg Oral BID  . enoxaparin (LOVENOX) injection  1 mg/kg Subcutaneous Q12H  . folic acid  1 mg Oral Daily  . multivitamin with minerals  1 tablet Oral Daily  . pantoprazole  40 mg Oral Daily  . rosuvastatin  40 mg Oral q1800  . sodium chloride flush  3 mL Intravenous Q12H  . thiamine  100 mg Oral Daily  . warfarin  7.5 mg Oral ONCE-1800  . Warfarin - Pharmacist Dosing Inpatient   Does not apply q1800   Continuous Infusions:   Procedures/Studies: Ct Head Wo Contrast  08/05/2015  CLINICAL DATA:  Level 2 trauma. Found with laceration to head and altered mental status. Right-sided head injury. History lung  cancer. Deep venous thrombosis. Hypertension. EXAM: CT HEAD WITHOUT CONTRAST CT CERVICAL SPINE WITHOUT CONTRAST TECHNIQUE: Multidetector CT imaging of the head and cervical spine was performed following the standard protocol without intravenous contrast. Multiplanar CT image reconstructions of the cervical spine were also generated. COMPARISON:  None. FINDINGS: CT HEAD FINDINGS Sinuses/Soft tissues: High right parietal scalp soft tissue swelling is mild to moderate. No skull fracture. Clear paranasal sinuses and mastoid air cells. Intracranial: No mass lesion, hemorrhage, hydrocephalus, acute infarct, intra-axial, or extra-axial fluid collection. CT CERVICAL SPINE FINDINGS Spinal visualization through the bottom of T1. Prevertebral soft tissues are within normal limits. No apical pneumothorax. Skull base intact. Expected for age cervical spondylosis with endplate osteophytes and loss of intervertebral disc  height at C5-6. Facets are well-aligned. Degenerative irregularity surrounds the tip of the odontoid process, without donor site. IMPRESSION: 1. Right-sided scalp soft tissue swelling, without acute intracranial abnormality. 2. Cervical spondylosis, without acute fracture or subluxation. Electronically Signed   By: Abigail Miyamoto M.D.   On: 08/05/2015 20:43   Ct Chest W Contrast  08/05/2015  CLINICAL DATA:  Found with laceration to the head and loss of consciousness. History of lung cancer. EXAM: CT CHEST, ABDOMEN, AND PELVIS WITH CONTRAST TECHNIQUE: Multidetector CT imaging of the chest, abdomen and pelvis was performed following the standard protocol during bolus administration of intravenous contrast. CONTRAST:  167m ISOVUE-300 IOPAMIDOL (ISOVUE-300) INJECTION 61% COMPARISON:  None. FINDINGS: CT CHEST FINDINGS Mediastinum/Lymph Nodes: No masses, or pathologically enlarged lymph nodes. The heart is normal in size. There is calcified atherosclerotic disease of the coronary arteries. There is tortuosity of the  aorta with significant calcified and noncalcified plaque throughout its course. There is a nonocclusive pulmonary embolus within right lower lobar pulmonary artery, extending into its segmental basilar branches. Lungs/Pleura: There is partially calcified posterior right upper lobe and middle lobe probably postsurgical scarring. Musculoskeletal: No chest wall mass or suspicious bone lesions identified. CT ABDOMEN PELVIS FINDINGS Hepatobiliary: There are numerous hypoattenuated lesions throughout the liver, the larger of which measure water density. Pancreas: No mass, inflammatory changes, or other significant abnormality. Spleen: Within normal limits in size and appearance. Adrenals/Urinary Tract: No masses identified. No evidence of hydronephrosis. There are bilateral partially exophytic large renal cysts, measuring up to 5 cm in the left lower pole. Stomach/Bowel: There is diffuse thickening of the gastric wall. No evidence of small-bowel obstruction. Vascular/Lymphatic: No pathologically enlarged lymph nodes. No evidence of abdominal aortic aneurysm. Atherosclerotic disease with calcified and noncalcified plaque is noted. Reproductive: The prostate gland is enlarged and globular with diffuse thickening of the urinary bladder wall. Other: None. Musculoskeletal: There is an acute fracture of the superior endplates of L2 and L3 vertebral bodies with mild grade 1 compression deformity. There are subacute healing right-sided rib fractures. There is a mixed lytic and sclerotic appearing lesion within the right iliac bone measuring approximately 2.9 cm. There is a sclerotic appearance of the shaft of the left scapula. IMPRESSION: Nonocclusive right lower lobar pulmonary embolus. Numerous hypoattenuated lesions throughout the liver, which measure water density, and by CT criteria represent cysts. Alternatively these may represent necrotic metastatic lesions. Simple appearing renal cysts. Diffuse gastric wall thickening.  Direct visualization may be considered as malignancy cannot be excluded. Enlarged globular prostate gland with likely secondary diffuse urinary bladder wall thickening. Please correlate to serum PSA values. Acute superior endplate L2 and L3 vertebral body fractures with grade 1 compression deformities and no alignment abnormality. Mixed lytic and sclerotic right iliac bone lesion ; metastatic disease cannot be excluded. Sclerotic appearance of the left scapula. Likely postsurgical scarring within the right upper and middle lobes of the lung. Advanced calcified and noncalcified atherosclerotic disease of the aorta. These results were called by telephone at the time of interpretation on 08/05/2015 at 9:05 pm to Dr. MTammy Sours, who verbally acknowledged these results. Electronically Signed   By: DFidela SalisburyM.D.   On: 08/05/2015 21:05   Ct Cervical Spine Wo Contrast  08/05/2015  CLINICAL DATA:  Level 2 trauma. Found with laceration to head and altered mental status. Right-sided head injury. History lung cancer. Deep venous thrombosis. Hypertension. EXAM: CT HEAD WITHOUT CONTRAST CT CERVICAL SPINE WITHOUT CONTRAST TECHNIQUE: Multidetector CT imaging of the head  and cervical spine was performed following the standard protocol without intravenous contrast. Multiplanar CT image reconstructions of the cervical spine were also generated. COMPARISON:  None. FINDINGS: CT HEAD FINDINGS Sinuses/Soft tissues: High right parietal scalp soft tissue swelling is mild to moderate. No skull fracture. Clear paranasal sinuses and mastoid air cells. Intracranial: No mass lesion, hemorrhage, hydrocephalus, acute infarct, intra-axial, or extra-axial fluid collection. CT CERVICAL SPINE FINDINGS Spinal visualization through the bottom of T1. Prevertebral soft tissues are within normal limits. No apical pneumothorax. Skull base intact. Expected for age cervical spondylosis with endplate osteophytes and loss of intervertebral disc  height at C5-6. Facets are well-aligned. Degenerative irregularity surrounds the tip of the odontoid process, without donor site. IMPRESSION: 1. Right-sided scalp soft tissue swelling, without acute intracranial abnormality. 2. Cervical spondylosis, without acute fracture or subluxation. Electronically Signed   By: Abigail Miyamoto M.D.   On: 08/05/2015 20:43   Ct Abdomen Pelvis W Contrast  08/05/2015  CLINICAL DATA:  Found with laceration to the head and loss of consciousness. History of lung cancer. EXAM: CT CHEST, ABDOMEN, AND PELVIS WITH CONTRAST TECHNIQUE: Multidetector CT imaging of the chest, abdomen and pelvis was performed following the standard protocol during bolus administration of intravenous contrast. CONTRAST:  130m ISOVUE-300 IOPAMIDOL (ISOVUE-300) INJECTION 61% COMPARISON:  None. FINDINGS: CT CHEST FINDINGS Mediastinum/Lymph Nodes: No masses, or pathologically enlarged lymph nodes. The heart is normal in size. There is calcified atherosclerotic disease of the coronary arteries. There is tortuosity of the aorta with significant calcified and noncalcified plaque throughout its course. There is a nonocclusive pulmonary embolus within right lower lobar pulmonary artery, extending into its segmental basilar branches. Lungs/Pleura: There is partially calcified posterior right upper lobe and middle lobe probably postsurgical scarring. Musculoskeletal: No chest wall mass or suspicious bone lesions identified. CT ABDOMEN PELVIS FINDINGS Hepatobiliary: There are numerous hypoattenuated lesions throughout the liver, the larger of which measure water density. Pancreas: No mass, inflammatory changes, or other significant abnormality. Spleen: Within normal limits in size and appearance. Adrenals/Urinary Tract: No masses identified. No evidence of hydronephrosis. There are bilateral partially exophytic large renal cysts, measuring up to 5 cm in the left lower pole. Stomach/Bowel: There is diffuse thickening of the  gastric wall. No evidence of small-bowel obstruction. Vascular/Lymphatic: No pathologically enlarged lymph nodes. No evidence of abdominal aortic aneurysm. Atherosclerotic disease with calcified and noncalcified plaque is noted. Reproductive: The prostate gland is enlarged and globular with diffuse thickening of the urinary bladder wall. Other: None. Musculoskeletal: There is an acute fracture of the superior endplates of L2 and L3 vertebral bodies with mild grade 1 compression deformity. There are subacute healing right-sided rib fractures. There is a mixed lytic and sclerotic appearing lesion within the right iliac bone measuring approximately 2.9 cm. There is a sclerotic appearance of the shaft of the left scapula. IMPRESSION: Nonocclusive right lower lobar pulmonary embolus. Numerous hypoattenuated lesions throughout the liver, which measure water density, and by CT criteria represent cysts. Alternatively these may represent necrotic metastatic lesions. Simple appearing renal cysts. Diffuse gastric wall thickening. Direct visualization may be considered as malignancy cannot be excluded. Enlarged globular prostate gland with likely secondary diffuse urinary bladder wall thickening. Please correlate to serum PSA values. Acute superior endplate L2 and L3 vertebral body fractures with grade 1 compression deformities and no alignment abnormality. Mixed lytic and sclerotic right iliac bone lesion ; metastatic disease cannot be excluded. Sclerotic appearance of the left scapula. Likely postsurgical scarring within the right upper and middle lobes  of the lung. Advanced calcified and noncalcified atherosclerotic disease of the aorta. These results were called by telephone at the time of interpretation on 08/05/2015 at 9:05 pm to Dr. Tammy Sours , who verbally acknowledged these results. Electronically Signed   By: Fidela Salisbury M.D.   On: 08/05/2015 21:05   Dg Chest Portable 1 View  08/05/2015  CLINICAL DATA:   Status post level 2 trauma.  Initial encounter. EXAM: PORTABLE CHEST 1 VIEW COMPARISON:  None. FINDINGS: The lungs are well-aerated and clear. There is no evidence of focal opacification, pleural effusion or pneumothorax. Postoperative change is noted at the right midlung. The cardiomediastinal silhouette is within normal limits. No acute osseous abnormalities are seen. IMPRESSION: No acute cardiopulmonary process seen. No displaced rib fractures identified. Electronically Signed   By: Garald Balding M.D.   On: 08/05/2015 19:48   Dg Hand Complete Right  08/05/2015  CLINICAL DATA:  Right hand laceration. EXAM: RIGHT HAND - COMPLETE 3+ VIEW COMPARISON:  None. FINDINGS: There is no evidence of fracture or dislocation. Narrowing and sclerosis of the first carpometacarpal joint is noted as well as the second and third distal interphalangeal joints. Soft tissues are unremarkable. IMPRESSION: Findings consistent with osteoarthritis. No acute abnormality or radiopaque foreign body seen in the right hand. Electronically Signed   By: Marijo Conception, M.D.   On: 08/05/2015 20:14    Makena Murdock, DO  [facility] Pager [phone]  If 7PM-7AM, please contact night-coverage [email or web address] Password [email or web address] 08/08/2015, 4:27 PM   LOS: 3 days

## 2015-08-08 NOTE — Discharge Instructions (Signed)

## 2015-08-09 LAB — CBC
HCT: 26.4 % — ABNORMAL LOW (ref 39.0–52.0)
Hemoglobin: 8.4 g/dL — ABNORMAL LOW (ref 13.0–17.0)
MCH: 29.2 pg (ref 26.0–34.0)
MCHC: 31.8 g/dL (ref 30.0–36.0)
MCV: 91.7 fL (ref 78.0–100.0)
PLATELETS: 210 10*3/uL (ref 150–400)
RBC: 2.88 MIL/uL — AB (ref 4.22–5.81)
RDW: 14.6 % (ref 11.5–15.5)
WBC: 4.6 10*3/uL (ref 4.0–10.5)

## 2015-08-09 LAB — BASIC METABOLIC PANEL
Anion gap: 6 (ref 5–15)
BUN: 5 mg/dL — AB (ref 6–20)
CHLORIDE: 106 mmol/L (ref 101–111)
CO2: 25 mmol/L (ref 22–32)
Calcium: 8.4 mg/dL — ABNORMAL LOW (ref 8.9–10.3)
Creatinine, Ser: 0.76 mg/dL (ref 0.61–1.24)
GFR calc Af Amer: >60 mL/min (ref >60)
GFR calc non Af Amer: >60 mL/min (ref >60)
Glucose, Bld: 96 mg/dL (ref 65–99)
POTASSIUM: 3.9 mmol/L (ref 3.5–5.1)
SODIUM: 137 mmol/L (ref 135–145)

## 2015-08-09 LAB — GLUCOSE, CAPILLARY
GLUCOSE-CAPILLARY: 104 mg/dL — AB (ref 65–99)
Glucose-Capillary: 98 mg/dL (ref 65–99)

## 2015-08-09 LAB — PROTIME-INR
INR: 1.08 (ref 0.00–1.49)
Prothrombin Time: 14.2 seconds (ref 11.6–15.2)

## 2015-08-09 LAB — PROCALCITONIN

## 2015-08-09 MED ORDER — WARFARIN SODIUM 10 MG PO TABS: 10.0000 mg | ORAL_TABLET | Freq: Once | ORAL | Status: DC

## 2015-08-09 NOTE — Care Management Important Message (Signed)
Important Message  Patient Details  Name: JOEY LIERMAN MRN: [id number] Date of Birth: [birth year]   Medicare Important Message Given:  Yes    Gaje Tennyson, Rory Percy, RN 08/09/2015, 3:41 PM

## 2015-08-09 NOTE — Progress Notes (Addendum)
ANTICOAGULATION CONSULT NOTE - Follow Up Consult  Pharmacy Consult for Lovenox and warfarin Indication: pulmonary embolus  Allergies  Allergen Reactions  . Hydrocodone Itching    Patient Measurements: Height: '5\' 9"'$  (175.3 cm) Weight: 145 lb 4.8 oz (65.908 kg) IBW/kg (Calculated) : 70.7 Heparin Dosing Weight: 65 kg  Vital Signs: Temp: 98.4 F (36.9 C) (05/26 0445) Temp Source: Oral (05/26 0445) BP: 128/69 mmHg (05/26 0445) Pulse Rate: 78 (05/26 0445)  Labs:  Recent Labs  08/06/15 2030 08/07/15 0212 08/08/15 0540 08/09/15 0437  HGB 8.4* 7.9* 8.2* 8.4*  HCT 26.6* 25.2* 26.0* 26.4*  PLT 187 183 188 210  LABPROT  --   --  14.9 14.2  INR  --   --  1.15 1.08  HEPARINUNFRC 0.34 0.38 0.51  --   CREATININE  --  0.94 0.75 0.76    Estimated Creatinine Clearance: 75.5 mL/min (by C-G formula based on Cr of 0.76).   Assessment: 49 YOM with hx lung CA s/p recent resection who [facility] on [facility] s/p a syncopal episode resulting in trauma to the R hand and head - woke up in a pool of blood and hypotensive. Work-up revealed a new PE and pharmacy was consulted to start heparin for anticoagulation. He now is transitioned to treatment dose Lovenox to facilitate discharge.  Continues on warfarin, today is D#3/5 of overlap. INR dropped this morning to 1.08. Patient has not missed any doses and medications have not changed. When I spoke with him yesterday, he did not report enjoying dark leefy greens, and noted he is not eating a lot of hospital food but rather what his daughter is bringing for him- doubt diet is reason behind low INR.  Hgb 8.4, plts WNL- no reported bleeding.  Goal of Therapy:  INR 2-3 Heparin level 0.3-0.5 units/ml Monitor platelets by anticoagulation protocol: Yes   Plan:  -Continue Lovenox '1mg'$ /kg ('70mg'$ ) q12h- he must be on this at least through 08/11/2015 to complete 5 day overlap, though INR needs to be therapeutic prior to stopping -Recommend Warfarin  '10mg'$  daily with an INR check on Monday 5/28 if able (if not d/t holiday, can wait until 5/29) -Daily INR and CBC q72h- watch for s/s bleeding   Maziah Keeling D. Valarie Farace, PharmD, BCPS Clinical Pharmacist Pager: [phone] 08/09/2015 7:56 AM

## 2015-08-09 NOTE — Care Management Note (Signed)
Case Management Note  Patient Details  Name: LEANTHONY RHETT MRN: [id number] Date of Birth: 05/24/1940  Subjective/Objective:         CM following for progression and d/c planning.           Action/Plan: 08/09/2015 Noted plan for pt to d/c to home with [facility] services. Original plan to d/c to SNF, however pt is minimal assist and is able to get in and out of bed without assistance as well as go to the bathroom without assistance. Pt wishes to go home and this CM met with pt to discuss d/c needs. Pt lives alone, daughter in [place] who checks on him every few days , per pt report. Pt has walker and 3:1 at home. [place] selected for [place], [place], [place] and social work services. [place] notified.   Expected Discharge Date:  08/09/15               Expected Discharge Plan:  [place]  In-House Referral:  Clinical Social Work  Discharge planning Services  CM Consult  Post Acute Care Choice:  Home Health Choice offered to:  Patient  DME Arranged:  N/A DME Agency:  NA  HH Arranged:  RN, PT, OT, Social Work [employer] Agency:  [place]  Status of Service:  Completed, signed off  Medicare Important Message Given:  Yes Date Medicare IM Given:    Medicare IM give by:    Date Additional Medicare IM Given:    Additional Medicare Important Message give by:     If discussed at [place] of Stay Meetings, dates discussed:    Additional Comments:  Adron Bene, RN 08/09/2015, 3:37 PM

## 2015-08-09 NOTE — Clinical Social Work Note (Signed)
CSW talked with patient's nurse regarding his progress with mobility and per RN, patient is independent with transfers and ambulation in the room. CSW has also observed patient's ability to sit-up at EOB from a lying position. Talked with patient and he feels very comfortable in going home. Talked with nurse case manager regarding home health for patient.  CSW signing off, however if other SW services needed prior to discharge, please advise.  Zylan Almquist Givens, MSW, LCSW Licensed Clinical Social Worker [place] [phone]

## 2015-08-09 NOTE — Progress Notes (Signed)
Timothy Wood to be D/C'd Home per MD order.  Discussed prescriptions and follow up appointments with the patient. Prescriptions given to patient, medication list explained in detail. Pt verbalized understanding.    Medication List    STOP taking these medications        amLODipine 5 MG tablet  Commonly known as:  NORVASC     aspirin 325 MG tablet     irbesartan-hydrochlorothiazide 150-12.5 MG tablet  Commonly known as:  AVALIDE     primidone 50 MG tablet  Commonly known as:  MYSOLINE      TAKE these medications        citalopram 20 MG tablet  Commonly known as:  CELEXA  Take 20 mg by mouth daily.     clopidogrel 75 MG tablet  Commonly known as:  PLAVIX  Take 75 mg by mouth daily.     cyclobenzaprine 10 MG tablet  Commonly known as:  FLEXERIL  Take 10 mg by mouth 2 (two) times daily.     enoxaparin 150 MG/ML injection  Commonly known as:  LOVENOX  Inject 0.45 mLs (70 mg total) into the skin every 12 (twelve) hours.     HYDROcodone-acetaminophen 5-325 MG tablet  Commonly known as:  NORCO/VICODIN  Take 1-2 tablets by mouth every 4 (four) hours as needed for moderate pain.     omeprazole 20 MG capsule  Commonly known as:  PRILOSEC  Take 20 mg by mouth 2 (two) times daily before a meal.     rosuvastatin 40 MG tablet  Commonly known as:  CRESTOR  Take 40 mg by mouth daily.     warfarin 10 MG tablet  Commonly known as:  COUMADIN  Take 1 tablet (10 mg total) by mouth one time only at 6 PM.        Filed Vitals:   08/09/15 0445 08/09/15 1100  BP: 128/69 139/70  Pulse: 78 72  Temp: 98.4 F (36.9 C) 98.3 F (36.8 C)  Resp: 18 20    Skin clean, dry and intact without evidence of skin break down, no evidence of skin tears noted. IV catheter discontinued intact. Site without signs and symptoms of complications. Dressing and pressure applied. Pt denies pain at this time. No complaints noted.  An After Visit Summary was printed and given to the patient. Patient  escorted via [place], and D/C home via private auto.  Retta Mac BSN, RN

## 2015-08-13 ENCOUNTER — Encounter: Payer: Self-pay | Admitting: Family Medicine

## 2015-08-13 ENCOUNTER — Telehealth: Payer: Self-pay | Admitting: Family Medicine

## 2015-08-13 ENCOUNTER — Ambulatory Visit: Payer: Medicare HMO | Admitting: Thoracic Surgery (Cardiothoracic Vascular Surgery)

## 2015-08-13 NOTE — Telephone Encounter (Signed)
Called patient. No answer. Rx was e-scribed to pharmacy on file on 08/09/15.

## 2015-08-13 NOTE — Telephone Encounter (Signed)
Pt need new Rx for warfarin 10 mg.  Pharm:  [facility] [address].

## 2015-08-14 DIAGNOSIS — R69 Illness, unspecified: Secondary | ICD-10-CM | POA: Diagnosis not present

## 2015-08-14 DIAGNOSIS — I2699 Other pulmonary embolism without acute cor pulmonale: Secondary | ICD-10-CM | POA: Diagnosis not present

## 2015-08-14 DIAGNOSIS — I2510 Atherosclerotic heart disease of native coronary artery without angina pectoris: Secondary | ICD-10-CM | POA: Diagnosis not present

## 2015-08-14 DIAGNOSIS — Z85118 Personal history of other malignant neoplasm of bronchus and lung: Secondary | ICD-10-CM | POA: Diagnosis not present

## 2015-08-14 DIAGNOSIS — G250 Essential tremor: Secondary | ICD-10-CM | POA: Diagnosis not present

## 2015-08-14 DIAGNOSIS — I10 Essential (primary) hypertension: Secondary | ICD-10-CM | POA: Diagnosis not present

## 2015-08-14 DIAGNOSIS — D62 Acute posthemorrhagic anemia: Secondary | ICD-10-CM | POA: Diagnosis not present

## 2015-08-14 DIAGNOSIS — S32030D Wedge compression fracture of third lumbar vertebra, subsequent encounter for fracture with routine healing: Secondary | ICD-10-CM | POA: Diagnosis not present

## 2015-08-14 DIAGNOSIS — K219 Gastro-esophageal reflux disease without esophagitis: Secondary | ICD-10-CM | POA: Diagnosis not present

## 2015-08-14 DIAGNOSIS — S32020D Wedge compression fracture of second lumbar vertebra, subsequent encounter for fracture with routine healing: Secondary | ICD-10-CM | POA: Diagnosis not present

## 2015-08-14 NOTE — Telephone Encounter (Signed)
Tried to reach patient. No answer.

## 2015-08-15 ENCOUNTER — Encounter: Payer: Self-pay | Admitting: Family Medicine

## 2015-08-15 ENCOUNTER — Ambulatory Visit (INDEPENDENT_AMBULATORY_CARE_PROVIDER_SITE_OTHER): Payer: Medicare HMO | Admitting: Family Medicine

## 2015-08-15 VITALS — BP 130/60 | HR 86 | Temp 98.0°F | Resp 12 | Ht 69.0 in | Wt 148.6 lb

## 2015-08-15 DIAGNOSIS — D649 Anemia, unspecified: Secondary | ICD-10-CM

## 2015-08-15 DIAGNOSIS — R55 Syncope and collapse: Secondary | ICD-10-CM

## 2015-08-15 DIAGNOSIS — Z789 Other specified health status: Secondary | ICD-10-CM | POA: Diagnosis not present

## 2015-08-15 DIAGNOSIS — Z85118 Personal history of other malignant neoplasm of bronchus and lung: Secondary | ICD-10-CM | POA: Diagnosis not present

## 2015-08-15 DIAGNOSIS — Z5181 Encounter for therapeutic drug level monitoring: Secondary | ICD-10-CM | POA: Diagnosis not present

## 2015-08-15 DIAGNOSIS — G250 Essential tremor: Secondary | ICD-10-CM | POA: Diagnosis not present

## 2015-08-15 DIAGNOSIS — K219 Gastro-esophageal reflux disease without esophagitis: Secondary | ICD-10-CM | POA: Diagnosis not present

## 2015-08-15 DIAGNOSIS — Z7901 Long term (current) use of anticoagulants: Secondary | ICD-10-CM

## 2015-08-15 DIAGNOSIS — I2510 Atherosclerotic heart disease of native coronary artery without angina pectoris: Secondary | ICD-10-CM | POA: Diagnosis not present

## 2015-08-15 DIAGNOSIS — I2699 Other pulmonary embolism without acute cor pulmonale: Secondary | ICD-10-CM | POA: Diagnosis not present

## 2015-08-15 DIAGNOSIS — I959 Hypotension, unspecified: Secondary | ICD-10-CM | POA: Diagnosis not present

## 2015-08-15 DIAGNOSIS — I10 Essential (primary) hypertension: Secondary | ICD-10-CM | POA: Diagnosis not present

## 2015-08-15 DIAGNOSIS — N179 Acute kidney failure, unspecified: Secondary | ICD-10-CM

## 2015-08-15 DIAGNOSIS — Z7289 Other problems related to lifestyle: Secondary | ICD-10-CM

## 2015-08-15 DIAGNOSIS — S32020D Wedge compression fracture of second lumbar vertebra, subsequent encounter for fracture with routine healing: Secondary | ICD-10-CM | POA: Diagnosis not present

## 2015-08-15 DIAGNOSIS — S61411D Laceration without foreign body of right hand, subsequent encounter: Secondary | ICD-10-CM

## 2015-08-15 DIAGNOSIS — R69 Illness, unspecified: Secondary | ICD-10-CM | POA: Diagnosis not present

## 2015-08-15 DIAGNOSIS — S32030D Wedge compression fracture of third lumbar vertebra, subsequent encounter for fracture with routine healing: Secondary | ICD-10-CM | POA: Diagnosis not present

## 2015-08-15 DIAGNOSIS — D62 Acute posthemorrhagic anemia: Secondary | ICD-10-CM | POA: Diagnosis not present

## 2015-08-15 DIAGNOSIS — F1090 Alcohol use, unspecified, uncomplicated: Secondary | ICD-10-CM

## 2015-08-15 DIAGNOSIS — S32000D Wedge compression fracture of unspecified lumbar vertebra, subsequent encounter for fracture with routine healing: Secondary | ICD-10-CM

## 2015-08-15 MED ORDER — WARFARIN SODIUM 5 MG PO TABS: 5.0000 mg | ORAL_TABLET | Freq: Every day | ORAL | Status: DC

## 2015-08-15 MED ORDER — VITAMIN B-1 100 MG PO TABS: 100.0000 mg | ORAL_TABLET | Freq: Every day | ORAL | Status: DC

## 2015-08-15 NOTE — Progress Notes (Signed)
Subjective:    Patient ID: Timothy Wood, male    DOB: 07-12-1940, 75 y.o.   MRN: [id number]  HPI   Timothy Wood is a 75 y.o.male here today wiith her daughter to follow up recent hospitalization.  He was admitted May 22 and discharged on May 26: Syncope, hypotension, AKF, and right lower lobe PE.  I saw him on 08/05/15 for dizziness, antihypertensive medications adjusted dur to orthostatic hypotension, he fell same night.  He was preparing a shot of brandy and next thing he remembered was being on the floor, head trauma and LOC. He called 911 and arrived to ER via EMS. Initial BP 62/40.  He has prior Hx of DVT. He was discharged on  Warfarin 10 mg and Lovenox daily, has not had INR checked.  Hx of PVD, so still on Plavix. He denies any epistaxis, gum bleeding, blood in stool, melena, or gross hematuria. + Easier bruising with minor trauma.  No falls since discharge, still dizziness , intermittent and stable.  BP reading low 100's/80's, today physico therapists checked BP:147/80 and 140/80's. Planning on starting home PT this Monday, his health insurance did not approved admission to a  SNF.  He lives alone, daughter lives about 20 min from his house.  Hx of alcohol abuse, his alcohol blood level was elevated at 156. Still drinking alcohol, does not specified amount, "not much." He is still smoking.   Denies severe/frequent headache, visual changes, chest pain, dyspnea, palpitation, claudication, focal weakness, or edema.  Lower back pain 6/10, no radiated, sharp, worse with movement. Hydrocodone-Acetaminophen was prescribed, when he takes it  and rest he has no pain.   CT abdomen and Pelvis: Acute superior endplate L2 and L3 vertebral body fractures with grade 1 compression deformities and no alignment abnormality. He has no appt with ortho or neurosurgeon, consultation with Dr Saintclair Halsted and Dr Algis Liming during hospital stay.   He has a walker at home.  Eating better,  2 meals daily, drinking fluids. According to pt, he has not received a phone call from [place] after hospital discharge  Lab Results  Component Value Date   WBC 4.6 08/09/2015   HGB 8.4* 08/09/2015   HCT 26.4* 08/09/2015   MCV 91.7 08/09/2015   PLT 210 08/09/2015   Lab Results  Component Value Date   CREATININE 0.76 08/09/2015   BUN 5* 08/09/2015   NA 137 08/09/2015   K 3.9 08/09/2015   CL 106 08/09/2015   CO2 25 08/09/2015    Right hand X OFB:PZWCHENI consistent with osteoarthritis. No acute abnormality or radiopaque foreign body seen in the right hand. Right hand laceration repair 08/05/15.  Abdomen/pelvic DP:OEUMPNTI hypoattenuated lesions throughout the liver, which measure water density, and by CT criteria represent cysts. Alternatively these may represent necrotic metastatic lesions.  Simple appearing renal cysts.  Diffuse gastric wall thickening.  Enlarged globular prostate gland with likely secondary diffuse urinary bladder wall thickening.    Mixed lytic and sclerotic right iliac bone lesion.   Chest RW:ERXVQ is a nonocclusive pulmonary embolus within right lower lobar pulmonary artery, extending into its segmental basilar branches.   CXR:No acute cardiopulmonary process seen. No displaced rib fractures identified.  Head CT: 1. Right-sided scalp soft tissue swelling, without acute intracranial abnormality.Cervical spondylosis, without acute fracture or subluxation   Review of Systems  Constitutional: Positive for fatigue. Negative for fever and appetite change.  HENT: Negative for facial swelling, mouth sores, nosebleeds, trouble swallowing and voice change.  Eyes: Negative for redness and visual disturbance.  Respiratory: Negative for cough, shortness of breath and wheezing.   Cardiovascular: Negative for chest pain and palpitations.  Gastrointestinal: Positive for constipation (Last bowel movement today). Negative for nausea, vomiting, abdominal pain and blood in  stool.       No melena.   Genitourinary: Negative for dysuria, hematuria and decreased urine volume.  Musculoskeletal: Positive for back pain. Negative for joint swelling.  Skin: Positive for wound. Negative for rash.  Neurological: Positive for dizziness and tremors (stable). Negative for seizures, syncope, weakness, numbness and headaches.  Hematological: Bruises/bleeds easily.  Psychiatric/Behavioral: Negative for suicidal ideas, confusion, self-injury and agitation. The patient is not nervous/anxious.      Current Outpatient Prescriptions on File Prior to Visit  Medication Sig Dispense Refill  . citalopram (CELEXA) 20 MG tablet Take 20 mg by mouth daily.    . clopidogrel (PLAVIX) 75 MG tablet TAKE 1 TABLET (75 MG TOTAL) BY MOUTH EVERY MORNING. 90 tablet 2  . clopidogrel (PLAVIX) 75 MG tablet Take 75 mg by mouth daily.    . CRESTOR 40 MG tablet TAKE 1 TABLET (40 MG TOTAL) BY MOUTH DAILY. 30 tablet 1  . cyclobenzaprine (FLEXERIL) 10 MG tablet Take 10 mg by mouth 2 (two) times daily.    . cyclobenzaprine (FLEXERIL) 10 MG tablet Take 10 mg by mouth 2 (two) times daily.    [name] Kitchen enoxaparin (LOVENOX) 150 MG/ML injection Inject 0.45 mLs (70 mg total) into the skin every 12 (twelve) hours. 14 Syringe 0  . HYDROcodone-acetaminophen (NORCO/VICODIN) 5-325 MG tablet Take 1-2 tablets by mouth every 4 (four) hours as needed for moderate pain. 30 tablet 0  . omeprazole (PRILOSEC) 20 MG capsule TAKE 1 CAPSULE (20 MG TOTAL) BY MOUTH 2 (TWO) TIMES DAILY BEFORE A MEAL. (Patient taking differently: Take 20 mg by mouth daily. TAKE 1 CAPSULE (20 MG TOTAL) BY MOUTH 2 (TWO) TIMES DAILY BEFORE A MEAL.) 180 capsule 1  . omeprazole (PRILOSEC) 20 MG capsule Take 20 mg by mouth 2 (two) times daily before a meal.     No current facility-administered medications on file prior to visit.     Past Medical History  Diagnosis Date  . Tobacco abuse   . Pulmonary nodule 06/2006    9 mm right upper obe pulmonary nodule  (negative Pet Scan)  . Personal history of colonic polyps 2008    multiple adenomas 2008-2009 exams with large cecal adenoma  . Hyperlipidemia   . PVD (peripheral vascular disease) ([place])     left leg s/p stent  . Seborrheic keratosis   . Sebaceous cyst     neck  . Sinusitis   . Blood clotting disorder ([place])   . Diverticulosis   . GERD (gastroesophageal reflux disease)   . Chronic obstructive pulmonary disease (COPD) ([place])     "patient not aware"  . Pneumonia     in history  . Depression   . Tremor   . DVT (deep venous thrombosis) ([place])     Post op  knee surgery  . Arthritis   . Histoplasmosis   . Adenocarcinoma of lung, stage 1 ([place])   . Hypertension   . DVT (deep venous thrombosis) ([place])   . Hypercholesteremia   . Lung cancer [facility])     Social History   Social History  . Marital Status: Married    Spouse Name: N/A  . Number of Children: 2  . Years of Education: N/A   Occupational History  .  Retired    Social History Main Topics  . Smoking status: Current Every Day Smoker -- 0.75 packs/day for 60 years    Types: Cigarettes  . Smokeless tobacco: None  . Alcohol Use: 0.0 oz/week    0 Standard drinks or equivalent per week     Comment: occasional  . Drug Use: No  . Sexual Activity: Not Currently   Other Topics Concern  . None   Social History Narrative   ** Merged History Encounter **        Married for over 50 years.  Wife died from lung cancer.   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   Occasional alcohol.    Tobacco use - over 50 pack years. Father died at age 42 of suicide. Timothy Wood-daughter emergenc   y contact    Filed Vitals:   08/15/15 1411  BP: 130/60  Pulse: 86  Temp: 98 F (36.7 C)  Resp: 12    Body mass index is 21.93 kg/(m^2). SpO2 Readings from Last 3 Encounters:  08/15/15 98%  08/09/15 97%  02/28/15 99%     Wt Readings from Last 3 Encounters:  08/15/15 148 lb 9.6 oz (67.405 kg)  08/08/15  145 lb 4.8 oz (65.908 kg)  08/05/15 144 lb 4.8 oz (65.454 kg)       Objective:   Physical Exam  Constitutional: He is oriented to person, place, and time. He appears well-developed and well-nourished. No distress.  HENT:  Mouth/Throat: Oropharynx is clear and moist. Mucous membranes are dry (mild).  Eyes: Conjunctivae and EOM are normal. Pupils are equal, round, and reactive to light. No scleral icterus.  Cardiovascular: Normal rate and regular rhythm.   No murmur heard. Pulmonary/Chest: Effort normal and breath sounds normal. He has no wheezes. He has no rales.  Abdominal: Soft. He exhibits no mass. There is no tenderness.  Musculoskeletal: He exhibits edema (bilateral pedal edema, non pitting). He exhibits no tenderness.  Lymphadenopathy:    He has no cervical adenopathy.  Neurological: He is alert and oriented to person, place, and time. He has normal strength. He displays tremor (mild hand tremor with intention). No cranial nerve deficit. Coordination normal.  In a wheel chair today  Skin: No rash noted. No erythema.     Laceration with 10 stitches on right hand, some loose, palmar, linear wound, 6 cm, no erythema or drainage. A few scattered ecchymosis on forearms, superficial laceration, healing well, right arm. Laceration right parieto-temporal with small hematoma, no tender, no sutures, no local heat (2-3 cm).  Psychiatric: He has a normal mood and affect.  Well groomed and good eye contact.  Nursing note and vitals reviewed.       Assessment & Plan:     Timothy Wood was seen today for follow-up.  Diagnoses and all orders for this visit:  Syncope and collapse  He has not had any episode since hospital discharge, he seems to be related to orthostatic hypotension but other causes discussed. Fall precautions also discussed.   Hypotension, unspecified hypotension type  Blood pressure seems to be better, goal < 150/90. He could still be having orthostatic hypotension  which might be contributing to his dizziness.  Recommended adequate hydration, consider drinking Gatorade small sips at the time during the day instead orange juice and sodas.He does not like water.  -     Basic metabolic panel  Alcohol use ([place])  We discussed adverse effects of high alcohol intake, strongly recommended  decreasing amount and stopping it, explained that it needs to be wean off. Recommended AA meeting to help with process, not interested. Healthy diet. Thiamine added.   -     thiamine (VITAMIN B-1) 100 MG tablet; Take 1 tablet (100 mg total) by mouth daily.   Anticoagulation management encounter  Today INR was 8, since he is not having any signs of bleeding we manage it as outpatient, he will call his Coumadin 10 mg for 2 days and tomorrow he will be seen at Coumadin clinic. Clearly instructed about warning signs. We discussed side effects of Coumadin and the risk of interaction with diet, medications, and alcohol. For now I am not changing Plavix, will decide next OV.   -     POC INR -     warfarin (COUMADIN) 5 MG tablet; Take 1 tablet (5 mg total) by mouth daily.   Anemia, unspecified  ? Blood loss + chronic disease anemia (?). Further recommendations would be given according to lab results today.  -     CBC  Pulmonary embolism, other ([place])  Since this is his second thromboembolic episode he is going to need to be on anticoagulation lifetime, some side effects were discussed,. Other treatment options were discussed, they need to be considered in the future if any problem following with Coumadin clinic. We need to consider other possible causes of thromboembolic episode given his history of lung cancer. Smoking cessation strongly recommended.   -     warfarin (COUMADIN) 5 MG tablet; Take 1 tablet (5 mg total) by mouth daily.   Acute kidney injury ([place])  Adequate hydration, further recommendations will be given according to lab results.   Hand  laceration, right, subsequent encounter  10 non- continues stitches removed, he tolerated the procedure well. Recommended massaging scar with Vaseline and range of motion exercises metacarpal phalangeal joints.   Lumbar compression fracture, with routine healing, subsequent encounter  No symptoms to suggest complication, appointment with neurosurgery will be arranged. Continue wearing hard brace. Side effects of opioid treatment discussed.   -     Ambulatory referral to Neurosurgery    I reviewed all imaging done during hospitalization and there are some lytic like lesions + liver lesions that could be metastatic given his Hx of lung cancer. We did not discuss it today but will discuss next OV.  -Patient advised to return sooner than planned or notify a doctor immediately if symptoms worsen or persist or new concerns arise, pt and daughter voive understanding.    Nareg Breighner G. [name], MD  [facility]. [place] office.

## 2015-08-15 NOTE — Progress Notes (Signed)
Pre visit review using our clinic review tool, if applicable. No additional management support is needed unless otherwise documented below in the visit note. 

## 2015-08-15 NOTE — Patient Instructions (Addendum)
A few things to remember from today's visit:   1. Hypotension, unspecified hypotension type  - Basic metabolic panel  2. Alcohol use ([facility])  - thiamine (VITAMIN B-1) 100 MG tablet; Take 1 tablet (100 mg total) by mouth daily.  Dispense: 90 tablet; Refill: 2  3. Anticoagulation management encounter  - POC INR  4. Anemia, unspecified  - CBC  5. Pulmonary embolism, other ([place])   6. Syncope and collapse   7. Acute kidney injury ([place])   8. Hand laceration, right, subsequent encounter     We have ordered labs or studies at this visit.  It can take up to 1-2 weeks for results and processing. IF results require follow up or explanation, we will call you with instructions. Clinically stable results will be released to your [facility]. If you have not heard from [place] or cannot find your results in [facility] in 2 weeks please contact our office at [phone].  If you are not yet signed up for [facility], please consider signing up  Use walker, slow movements, fall precautions. Adequate hydration.  Healthy diet, 3 meals daily at least.  Decrease and stop alcohol intake, consider AA.  Massage scar on hand with vaseline.   TOBACCO CESSATION encouraged.  If you sign-up for My chart, you can communicate easier with [place] in case you have any question or concern.

## 2015-08-16 ENCOUNTER — Ambulatory Visit (INDEPENDENT_AMBULATORY_CARE_PROVIDER_SITE_OTHER): Payer: Medicare HMO | Admitting: General Practice

## 2015-08-16 ENCOUNTER — Other Ambulatory Visit: Payer: Self-pay | Admitting: General Practice

## 2015-08-16 ENCOUNTER — Telehealth: Payer: Self-pay | Admitting: Family Medicine

## 2015-08-16 DIAGNOSIS — Z5181 Encounter for therapeutic drug level monitoring: Secondary | ICD-10-CM | POA: Insufficient documentation

## 2015-08-16 DIAGNOSIS — I2699 Other pulmonary embolism without acute cor pulmonale: Secondary | ICD-10-CM

## 2015-08-16 DIAGNOSIS — Z7901 Long term (current) use of anticoagulants: Secondary | ICD-10-CM

## 2015-08-16 LAB — BASIC METABOLIC PANEL
BUN: 15 mg/dL (ref 6–23)
CHLORIDE: 106 meq/L (ref 96–112)
CO2: 29 mEq/L (ref 19–32)
CREATININE: 1.01 mg/dL (ref 0.40–1.50)
Calcium: 8.8 mg/dL (ref 8.4–10.5)
GFR: 76.66 mL/min (ref >60.00)
Glucose, Bld: 66 mg/dL — ABNORMAL LOW (ref 70–99)
POTASSIUM: 4.5 meq/L (ref 3.5–5.1)
Sodium: 140 mEq/L (ref 135–145)

## 2015-08-16 LAB — CBC
HCT: 27.9 % — ABNORMAL LOW (ref 39.0–52.0)
HEMOGLOBIN: 9.2 g/dL — AB (ref 13.0–17.0)
MCHC: 33 g/dL (ref 30.0–36.0)
MCV: 89.7 fl (ref 78.0–100.0)
Platelets: 431 10*3/uL — ABNORMAL HIGH (ref 150.0–400.0)
RBC: 3.12 Mil/uL — AB (ref 4.22–5.81)
RDW: 15.9 % — AB (ref 11.5–15.5)
WBC: 5 10*3/uL (ref 4.0–10.5)

## 2015-08-16 LAB — POCT INR: INR: 5

## 2015-08-16 NOTE — Progress Notes (Signed)
Pre visit review using our clinic review tool, if applicable. No additional management support is needed unless otherwise documented below in the visit note. A full discussion of the nature of anticoagulants has been carried out.  A benefit risk analysis has been presented to the patient, so that they understand the justification for choosing anticoagulation at this time. The need for frequent and regular monitoring, precise dosage adjustment and compliance is stressed.  Side effects of potential bleeding are discussed.  The patient should avoid any OTC items containing aspirin or ibuprofen, and should avoid great swings in general diet.  Avoid alcohol consumption.  Call if any signs of abnormal bleeding.

## 2015-08-16 NOTE — Telephone Encounter (Signed)
Called [name]. Gave verbal.

## 2015-08-16 NOTE — Telephone Encounter (Signed)
[name] would  like to see pt one time for 1 wk and twice a wk next wk and one time for last week to work on [employer] and home safety. Verbal is ok

## 2015-08-16 NOTE — Progress Notes (Signed)
I have reviewed and agree with the plan. 

## 2015-08-19 ENCOUNTER — Ambulatory Visit: Payer: Self-pay | Admitting: General Practice

## 2015-08-19 ENCOUNTER — Ambulatory Visit: Payer: Medicare HMO | Admitting: Family Medicine

## 2015-08-19 DIAGNOSIS — D62 Acute posthemorrhagic anemia: Secondary | ICD-10-CM | POA: Diagnosis not present

## 2015-08-19 DIAGNOSIS — R69 Illness, unspecified: Secondary | ICD-10-CM | POA: Diagnosis not present

## 2015-08-19 DIAGNOSIS — I2699 Other pulmonary embolism without acute cor pulmonale: Secondary | ICD-10-CM | POA: Diagnosis not present

## 2015-08-19 DIAGNOSIS — I10 Essential (primary) hypertension: Secondary | ICD-10-CM | POA: Diagnosis not present

## 2015-08-19 DIAGNOSIS — S32020D Wedge compression fracture of second lumbar vertebra, subsequent encounter for fracture with routine healing: Secondary | ICD-10-CM | POA: Diagnosis not present

## 2015-08-19 DIAGNOSIS — I2510 Atherosclerotic heart disease of native coronary artery without angina pectoris: Secondary | ICD-10-CM | POA: Diagnosis not present

## 2015-08-19 DIAGNOSIS — Z85118 Personal history of other malignant neoplasm of bronchus and lung: Secondary | ICD-10-CM | POA: Diagnosis not present

## 2015-08-19 DIAGNOSIS — K219 Gastro-esophageal reflux disease without esophagitis: Secondary | ICD-10-CM | POA: Diagnosis not present

## 2015-08-19 DIAGNOSIS — G250 Essential tremor: Secondary | ICD-10-CM | POA: Diagnosis not present

## 2015-08-19 DIAGNOSIS — S32030D Wedge compression fracture of third lumbar vertebra, subsequent encounter for fracture with routine healing: Secondary | ICD-10-CM | POA: Diagnosis not present

## 2015-08-19 DIAGNOSIS — Z5181 Encounter for therapeutic drug level monitoring: Secondary | ICD-10-CM

## 2015-08-19 LAB — POCT INR
INR: 2
INR: 2
INR: 2
INR: 8

## 2015-08-19 NOTE — Telephone Encounter (Signed)
Agree with recommendation. Thanks, BJ

## 2015-08-19 NOTE — Progress Notes (Signed)
Pre visit review using our clinic review tool, if applicable. No additional management support is needed unless otherwise documented below in the visit note. 

## 2015-08-20 ENCOUNTER — Telehealth: Payer: Self-pay | Admitting: Neurology

## 2015-08-20 ENCOUNTER — Telehealth: Payer: Self-pay | Admitting: General Practice

## 2015-08-20 DIAGNOSIS — R69 Illness, unspecified: Secondary | ICD-10-CM | POA: Diagnosis not present

## 2015-08-20 DIAGNOSIS — I2510 Atherosclerotic heart disease of native coronary artery without angina pectoris: Secondary | ICD-10-CM | POA: Diagnosis not present

## 2015-08-20 DIAGNOSIS — S32030D Wedge compression fracture of third lumbar vertebra, subsequent encounter for fracture with routine healing: Secondary | ICD-10-CM | POA: Diagnosis not present

## 2015-08-20 DIAGNOSIS — D62 Acute posthemorrhagic anemia: Secondary | ICD-10-CM | POA: Diagnosis not present

## 2015-08-20 DIAGNOSIS — G250 Essential tremor: Secondary | ICD-10-CM | POA: Diagnosis not present

## 2015-08-20 DIAGNOSIS — S32020D Wedge compression fracture of second lumbar vertebra, subsequent encounter for fracture with routine healing: Secondary | ICD-10-CM | POA: Diagnosis not present

## 2015-08-20 DIAGNOSIS — I2699 Other pulmonary embolism without acute cor pulmonale: Secondary | ICD-10-CM | POA: Diagnosis not present

## 2015-08-20 DIAGNOSIS — K219 Gastro-esophageal reflux disease without esophagitis: Secondary | ICD-10-CM | POA: Diagnosis not present

## 2015-08-20 DIAGNOSIS — Z85118 Personal history of other malignant neoplasm of bronchus and lung: Secondary | ICD-10-CM | POA: Diagnosis not present

## 2015-08-20 DIAGNOSIS — I10 Essential (primary) hypertension: Secondary | ICD-10-CM | POA: Diagnosis not present

## 2015-08-20 NOTE — Telephone Encounter (Signed)
LMOVM to call office about patient's coumadin dosage.

## 2015-08-20 NOTE — Telephone Encounter (Signed)
Spoke with patient- he asked for a refill of oxycodone. Aware we do not prescribe pain medication and to contact PCP.

## 2015-08-20 NOTE — Telephone Encounter (Signed)
VM-PT left a message he needs a refill on medication/Dawn CB# [phone]

## 2015-08-22 DIAGNOSIS — I10 Essential (primary) hypertension: Secondary | ICD-10-CM | POA: Diagnosis not present

## 2015-08-22 DIAGNOSIS — G250 Essential tremor: Secondary | ICD-10-CM | POA: Diagnosis not present

## 2015-08-22 DIAGNOSIS — S32030D Wedge compression fracture of third lumbar vertebra, subsequent encounter for fracture with routine healing: Secondary | ICD-10-CM | POA: Diagnosis not present

## 2015-08-22 DIAGNOSIS — D62 Acute posthemorrhagic anemia: Secondary | ICD-10-CM | POA: Diagnosis not present

## 2015-08-22 DIAGNOSIS — R69 Illness, unspecified: Secondary | ICD-10-CM | POA: Diagnosis not present

## 2015-08-22 DIAGNOSIS — S32020D Wedge compression fracture of second lumbar vertebra, subsequent encounter for fracture with routine healing: Secondary | ICD-10-CM | POA: Diagnosis not present

## 2015-08-22 DIAGNOSIS — I2510 Atherosclerotic heart disease of native coronary artery without angina pectoris: Secondary | ICD-10-CM | POA: Diagnosis not present

## 2015-08-22 DIAGNOSIS — K219 Gastro-esophageal reflux disease without esophagitis: Secondary | ICD-10-CM | POA: Diagnosis not present

## 2015-08-22 DIAGNOSIS — Z85118 Personal history of other malignant neoplasm of bronchus and lung: Secondary | ICD-10-CM | POA: Diagnosis not present

## 2015-08-22 DIAGNOSIS — I2699 Other pulmonary embolism without acute cor pulmonale: Secondary | ICD-10-CM | POA: Diagnosis not present

## 2015-08-23 ENCOUNTER — Ambulatory Visit: Payer: Self-pay | Admitting: General Practice

## 2015-08-23 DIAGNOSIS — I2699 Other pulmonary embolism without acute cor pulmonale: Secondary | ICD-10-CM | POA: Diagnosis not present

## 2015-08-23 DIAGNOSIS — R69 Illness, unspecified: Secondary | ICD-10-CM | POA: Diagnosis not present

## 2015-08-23 DIAGNOSIS — Z5181 Encounter for therapeutic drug level monitoring: Secondary | ICD-10-CM

## 2015-08-23 DIAGNOSIS — D62 Acute posthemorrhagic anemia: Secondary | ICD-10-CM | POA: Diagnosis not present

## 2015-08-23 DIAGNOSIS — S32020D Wedge compression fracture of second lumbar vertebra, subsequent encounter for fracture with routine healing: Secondary | ICD-10-CM | POA: Diagnosis not present

## 2015-08-23 DIAGNOSIS — I10 Essential (primary) hypertension: Secondary | ICD-10-CM | POA: Diagnosis not present

## 2015-08-23 DIAGNOSIS — K219 Gastro-esophageal reflux disease without esophagitis: Secondary | ICD-10-CM | POA: Diagnosis not present

## 2015-08-23 DIAGNOSIS — S32030D Wedge compression fracture of third lumbar vertebra, subsequent encounter for fracture with routine healing: Secondary | ICD-10-CM | POA: Diagnosis not present

## 2015-08-23 DIAGNOSIS — I2510 Atherosclerotic heart disease of native coronary artery without angina pectoris: Secondary | ICD-10-CM | POA: Diagnosis not present

## 2015-08-23 DIAGNOSIS — Z85118 Personal history of other malignant neoplasm of bronchus and lung: Secondary | ICD-10-CM | POA: Diagnosis not present

## 2015-08-23 DIAGNOSIS — G250 Essential tremor: Secondary | ICD-10-CM | POA: Diagnosis not present

## 2015-08-23 LAB — POCT INR: INR: 2.3

## 2015-08-23 NOTE — Progress Notes (Signed)
Pre visit review using our clinic review tool, if applicable. No additional management support is needed unless otherwise documented below in the visit note. 

## 2015-08-26 DIAGNOSIS — R69 Illness, unspecified: Secondary | ICD-10-CM | POA: Diagnosis not present

## 2015-08-26 DIAGNOSIS — D62 Acute posthemorrhagic anemia: Secondary | ICD-10-CM | POA: Diagnosis not present

## 2015-08-26 DIAGNOSIS — I2510 Atherosclerotic heart disease of native coronary artery without angina pectoris: Secondary | ICD-10-CM | POA: Diagnosis not present

## 2015-08-26 DIAGNOSIS — K219 Gastro-esophageal reflux disease without esophagitis: Secondary | ICD-10-CM | POA: Diagnosis not present

## 2015-08-26 DIAGNOSIS — G250 Essential tremor: Secondary | ICD-10-CM | POA: Diagnosis not present

## 2015-08-26 DIAGNOSIS — S32020D Wedge compression fracture of second lumbar vertebra, subsequent encounter for fracture with routine healing: Secondary | ICD-10-CM | POA: Diagnosis not present

## 2015-08-26 DIAGNOSIS — S32030D Wedge compression fracture of third lumbar vertebra, subsequent encounter for fracture with routine healing: Secondary | ICD-10-CM | POA: Diagnosis not present

## 2015-08-26 DIAGNOSIS — I2699 Other pulmonary embolism without acute cor pulmonale: Secondary | ICD-10-CM | POA: Diagnosis not present

## 2015-08-26 DIAGNOSIS — I10 Essential (primary) hypertension: Secondary | ICD-10-CM | POA: Diagnosis not present

## 2015-08-26 DIAGNOSIS — Z85118 Personal history of other malignant neoplasm of bronchus and lung: Secondary | ICD-10-CM | POA: Diagnosis not present

## 2015-08-27 ENCOUNTER — Other Ambulatory Visit: Payer: Self-pay | Admitting: Adult Health

## 2015-08-27 DIAGNOSIS — R69 Illness, unspecified: Secondary | ICD-10-CM | POA: Diagnosis not present

## 2015-08-27 DIAGNOSIS — Z85118 Personal history of other malignant neoplasm of bronchus and lung: Secondary | ICD-10-CM | POA: Diagnosis not present

## 2015-08-27 DIAGNOSIS — I2510 Atherosclerotic heart disease of native coronary artery without angina pectoris: Secondary | ICD-10-CM | POA: Diagnosis not present

## 2015-08-27 DIAGNOSIS — I10 Essential (primary) hypertension: Secondary | ICD-10-CM | POA: Diagnosis not present

## 2015-08-27 DIAGNOSIS — S32030D Wedge compression fracture of third lumbar vertebra, subsequent encounter for fracture with routine healing: Secondary | ICD-10-CM | POA: Diagnosis not present

## 2015-08-27 DIAGNOSIS — I2699 Other pulmonary embolism without acute cor pulmonale: Secondary | ICD-10-CM | POA: Diagnosis not present

## 2015-08-27 DIAGNOSIS — K219 Gastro-esophageal reflux disease without esophagitis: Secondary | ICD-10-CM | POA: Diagnosis not present

## 2015-08-27 DIAGNOSIS — G250 Essential tremor: Secondary | ICD-10-CM | POA: Diagnosis not present

## 2015-08-27 DIAGNOSIS — S32020D Wedge compression fracture of second lumbar vertebra, subsequent encounter for fracture with routine healing: Secondary | ICD-10-CM | POA: Diagnosis not present

## 2015-08-27 DIAGNOSIS — D62 Acute posthemorrhagic anemia: Secondary | ICD-10-CM | POA: Diagnosis not present

## 2015-08-27 NOTE — Telephone Encounter (Signed)
Rx refill sent to pharmacy. 

## 2015-08-29 DIAGNOSIS — S32000A Wedge compression fracture of unspecified lumbar vertebra, initial encounter for closed fracture: Secondary | ICD-10-CM | POA: Diagnosis not present

## 2015-08-30 ENCOUNTER — Telehealth: Payer: Self-pay | Admitting: Family Medicine

## 2015-08-30 DIAGNOSIS — S32020D Wedge compression fracture of second lumbar vertebra, subsequent encounter for fracture with routine healing: Secondary | ICD-10-CM | POA: Diagnosis not present

## 2015-08-30 DIAGNOSIS — I2699 Other pulmonary embolism without acute cor pulmonale: Secondary | ICD-10-CM | POA: Diagnosis not present

## 2015-08-30 DIAGNOSIS — Z85118 Personal history of other malignant neoplasm of bronchus and lung: Secondary | ICD-10-CM | POA: Diagnosis not present

## 2015-08-30 DIAGNOSIS — I2510 Atherosclerotic heart disease of native coronary artery without angina pectoris: Secondary | ICD-10-CM | POA: Diagnosis not present

## 2015-08-30 DIAGNOSIS — S32030D Wedge compression fracture of third lumbar vertebra, subsequent encounter for fracture with routine healing: Secondary | ICD-10-CM | POA: Diagnosis not present

## 2015-08-30 DIAGNOSIS — G250 Essential tremor: Secondary | ICD-10-CM | POA: Diagnosis not present

## 2015-08-30 DIAGNOSIS — D62 Acute posthemorrhagic anemia: Secondary | ICD-10-CM | POA: Diagnosis not present

## 2015-08-30 DIAGNOSIS — K219 Gastro-esophageal reflux disease without esophagitis: Secondary | ICD-10-CM | POA: Diagnosis not present

## 2015-08-30 DIAGNOSIS — R69 Illness, unspecified: Secondary | ICD-10-CM | POA: Diagnosis not present

## 2015-08-30 DIAGNOSIS — I10 Essential (primary) hypertension: Secondary | ICD-10-CM | POA: Diagnosis not present

## 2015-08-30 LAB — POCT INR: INR: 2

## 2015-08-30 NOTE — Telephone Encounter (Signed)
Timothy Wood from [facility] call to report PT  INR  2.0

## 2015-08-30 NOTE — Telephone Encounter (Signed)
Goal is between 2-3. He is supposed to be following with coumadin clinic. Thanks!

## 2015-09-02 ENCOUNTER — Telehealth: Payer: Self-pay | Admitting: Internal Medicine

## 2015-09-02 ENCOUNTER — Ambulatory Visit: Payer: Self-pay | Admitting: General Practice

## 2015-09-02 DIAGNOSIS — Z5181 Encounter for therapeutic drug level monitoring: Secondary | ICD-10-CM

## 2015-09-02 DIAGNOSIS — S32000A Wedge compression fracture of unspecified lumbar vertebra, initial encounter for closed fracture: Secondary | ICD-10-CM | POA: Diagnosis not present

## 2015-09-02 NOTE — Progress Notes (Signed)
Pre visit review using our clinic review tool, if applicable. No additional management support is needed unless otherwise documented below in the visit note. 

## 2015-09-02 NOTE — Telephone Encounter (Signed)
Faxed pt medical records to [facility] healthcare [phone]

## 2015-09-11 ENCOUNTER — Ambulatory Visit (INDEPENDENT_AMBULATORY_CARE_PROVIDER_SITE_OTHER): Payer: Medicare HMO | Admitting: General Practice

## 2015-09-11 DIAGNOSIS — Z5181 Encounter for therapeutic drug level monitoring: Secondary | ICD-10-CM | POA: Diagnosis not present

## 2015-09-11 LAB — POCT INR: INR: 4.3

## 2015-09-11 NOTE — Progress Notes (Signed)
Pre visit review using our clinic review tool, if applicable. No additional management support is needed unless otherwise documented below in the visit note. 

## 2015-09-23 ENCOUNTER — Other Ambulatory Visit: Payer: Medicare HMO

## 2015-09-23 ENCOUNTER — Ambulatory Visit (HOSPITAL_COMMUNITY): Payer: Medicare HMO

## 2015-09-23 ENCOUNTER — Ambulatory Visit: Payer: Medicare HMO | Admitting: Neurology

## 2015-09-24 ENCOUNTER — Ambulatory Visit: Payer: Medicare HMO | Admitting: Thoracic Surgery (Cardiothoracic Vascular Surgery)

## 2015-09-25 ENCOUNTER — Ambulatory Visit: Payer: Medicare HMO

## 2015-09-30 ENCOUNTER — Ambulatory Visit: Payer: Medicare HMO | Admitting: Internal Medicine

## 2015-10-01 DIAGNOSIS — S32000A Wedge compression fracture of unspecified lumbar vertebra, initial encounter for closed fracture: Secondary | ICD-10-CM | POA: Diagnosis not present

## 2015-10-02 ENCOUNTER — Other Ambulatory Visit: Payer: Self-pay | Admitting: Medical Oncology

## 2015-10-02 DIAGNOSIS — C3491 Malignant neoplasm of unspecified part of right bronchus or lung: Secondary | ICD-10-CM

## 2015-10-03 ENCOUNTER — Ambulatory Visit (HOSPITAL_COMMUNITY)
Admission: RE | Admit: 2015-10-03 | Discharge: 2015-10-03 | Disposition: A | Discharge status: Home / Self Care | Payer: Medicare HMO | Source: Ambulatory Visit | Attending: Internal Medicine | Admitting: Internal Medicine

## 2015-10-03 ENCOUNTER — Other Ambulatory Visit (HOSPITAL_BASED_OUTPATIENT_CLINIC_OR_DEPARTMENT_OTHER): Payer: Medicare HMO

## 2015-10-03 DIAGNOSIS — Z9889 Other specified postprocedural states: Secondary | ICD-10-CM | POA: Insufficient documentation

## 2015-10-03 DIAGNOSIS — C3491 Malignant neoplasm of unspecified part of right bronchus or lung: Secondary | ICD-10-CM | POA: Insufficient documentation

## 2015-10-03 DIAGNOSIS — R918 Other nonspecific abnormal finding of lung field: Secondary | ICD-10-CM | POA: Insufficient documentation

## 2015-10-03 DIAGNOSIS — I2510 Atherosclerotic heart disease of native coronary artery without angina pectoris: Secondary | ICD-10-CM | POA: Diagnosis not present

## 2015-10-03 DIAGNOSIS — C342 Malignant neoplasm of middle lobe, bronchus or lung: Secondary | ICD-10-CM | POA: Diagnosis not present

## 2015-10-03 DIAGNOSIS — I700 Atherosclerosis of aorta: Secondary | ICD-10-CM | POA: Diagnosis not present

## 2015-10-03 DIAGNOSIS — S2243XA Multiple fractures of ribs, bilateral, initial encounter for closed fracture: Secondary | ICD-10-CM | POA: Insufficient documentation

## 2015-10-03 LAB — COMPREHENSIVE METABOLIC PANEL
ALBUMIN: 4 g/dL (ref 3.5–5.0)
ALK PHOS: 101 U/L (ref 40–150)
ALT: <9 U/L (ref 0–55)
ANION GAP: 9 meq/L (ref 3–11)
AST: 10 U/L (ref 5–34)
BUN: 12.2 mg/dL (ref 7.0–26.0)
CO2: 23 mEq/L (ref 22–29)
Calcium: 9.3 mg/dL (ref 8.4–10.4)
Chloride: 108 mEq/L (ref 98–109)
Creatinine: 0.9 mg/dL (ref 0.7–1.3)
EGFR: 84 mL/min/{1.73_m2} — AB (ref >90)
GLUCOSE: 102 mg/dL (ref 70–140)
POTASSIUM: 4.1 meq/L (ref 3.5–5.1)
SODIUM: 140 meq/L (ref 136–145)
Total Bilirubin: 0.54 mg/dL (ref 0.20–1.20)
Total Protein: 7.3 g/dL (ref 6.4–8.3)

## 2015-10-03 LAB — CBC WITH DIFFERENTIAL/PLATELET
BASO%: 0.9 % (ref 0.0–2.0)
BASOS ABS: 0.1 10*3/uL (ref 0.0–0.1)
EOS ABS: 0.1 10*3/uL (ref 0.0–0.5)
EOS%: 2.1 % (ref 0.0–7.0)
HCT: 34.5 % — ABNORMAL LOW (ref 38.4–49.9)
HEMOGLOBIN: 10.9 g/dL — AB (ref 13.0–17.1)
LYMPH%: 22.5 % (ref 14.0–49.0)
MCH: 24.7 pg — AB (ref 27.2–33.4)
MCHC: 31.8 g/dL — ABNORMAL LOW (ref 32.0–36.0)
MCV: 77.9 fL — AB (ref 79.3–98.0)
MONO#: 0.8 10*3/uL (ref 0.1–0.9)
MONO%: 12.3 % (ref 0.0–14.0)
NEUT#: 4.1 10*3/uL (ref 1.5–6.5)
NEUT%: 62.2 % (ref 39.0–75.0)
PLATELETS: 328 10*3/uL (ref 140–400)
RBC: 4.43 10*6/uL (ref 4.20–5.82)
RDW: 18 % — ABNORMAL HIGH (ref 11.0–14.6)
WBC: 6.6 10*3/uL (ref 4.0–10.3)
lymph#: 1.5 10*3/uL (ref 0.9–3.3)

## 2015-10-03 MED ORDER — IOPAMIDOL (ISOVUE-300) INJECTION 61%
75.0000 mL | Freq: Once | INTRAVENOUS | Status: AC | PRN
  Administered 2015-10-03: 75 mL via INTRAVENOUS

## 2015-10-04 ENCOUNTER — Ambulatory Visit: Payer: Medicare HMO | Admitting: Family Medicine

## 2015-10-07 ENCOUNTER — Ambulatory Visit (HOSPITAL_BASED_OUTPATIENT_CLINIC_OR_DEPARTMENT_OTHER): Payer: Medicare HMO | Admitting: Internal Medicine

## 2015-10-07 ENCOUNTER — Encounter: Payer: Self-pay | Admitting: Internal Medicine

## 2015-10-07 ENCOUNTER — Other Ambulatory Visit: Payer: Self-pay | Admitting: Thoracic Surgery (Cardiothoracic Vascular Surgery)

## 2015-10-07 ENCOUNTER — Telehealth: Payer: Self-pay | Admitting: Internal Medicine

## 2015-10-07 VITALS — BP 167/77 | HR 94 | Temp 98.2°F | Resp 19 | Ht 69.0 in | Wt 146.6 lb

## 2015-10-07 DIAGNOSIS — Z85118 Personal history of other malignant neoplasm of bronchus and lung: Secondary | ICD-10-CM

## 2015-10-07 DIAGNOSIS — C3491 Malignant neoplasm of unspecified part of right bronchus or lung: Secondary | ICD-10-CM

## 2015-10-07 DIAGNOSIS — D509 Iron deficiency anemia, unspecified: Secondary | ICD-10-CM | POA: Diagnosis not present

## 2015-10-07 DIAGNOSIS — M899 Disorder of bone, unspecified: Secondary | ICD-10-CM | POA: Diagnosis not present

## 2015-10-07 DIAGNOSIS — C3490 Malignant neoplasm of unspecified part of unspecified bronchus or lung: Secondary | ICD-10-CM

## 2015-10-07 NOTE — Telephone Encounter (Signed)
Gave pt cal & avs °

## 2015-10-07 NOTE — Progress Notes (Signed)
Timothy Wood [phone]   [phone]  OFFICE PROGRESS NOTE  Timothy Wood, [place] [address]  DIAGNOSIS: Stage IA (T1a, N0, M0) non-small cell lung cancer, adenocarcinoma  diagnosed in April 2016.  PRIOR THERAPY: Status post right VATS with wedge resection of the right middle lobe as well as wedge resection of the right upper lobe and pleural biopsy under the care of [name] on 01/24/2015.  CURRENT THERAPY: Observation.  INTERVAL HISTORY: Timothy Wood 75 y.o. male returns to the clinic today for follow-up visit accompanied by his daughter Timothy Wood. The patient is feeling fine today with no specific complaints. He had a fall 2 months ago and hurt his head. During his evaluation CT scan of the head, chest as well as abdomen and pelvis were performed and showed some concerning a sclerotic and lytic lesions in the pelvic and his scapula. He is worried about disease recurrence. The patient denied having any current chest pain, shortness breath, cough or hemoptysis. He has no nausea, vomiting, diarrhea or constipation. He has no significant weight loss or night sweats. He had repeat CT scan of the chest performed recently and he is here for evaluation and discussion of his scan results.  MEDICAL HISTORY: Past Medical History:  Diagnosis Date  . Adenocarcinoma of lung, stage 1 ([place])   . Arthritis   . Blood clotting disorder ([place])   . Chronic obstructive pulmonary disease (COPD) ([place])    "patient not aware"  . Depression   . Diverticulosis   . DVT (deep venous thrombosis) ([place] [facility])    Post op  knee surgery  . DVT (deep venous thrombosis) ([facility])   . GERD (gastroesophageal reflux disease)   . Histoplasmosis   . Hypercholesteremia   . Hyperlipidemia   . Hypertension   . Lung cancer ([place])   . Personal history of colonic polyps 2008   multiple adenomas 2008-2009 exams with large cecal adenoma  . Pneumonia    in  history  . Pulmonary nodule 06/2006   9 mm right upper obe pulmonary nodule (negative Pet Scan)  . PVD (peripheral vascular disease) ([place])    left leg s/p stent  . Sebaceous cyst    neck  . Seborrheic keratosis   . Sinusitis   . Tobacco abuse   . Tremor     ALLERGIES:  is allergic to hydrocodone and hydrocodone.  MEDICATIONS:  Current Outpatient Prescriptions  Medication Sig Dispense Refill  . citalopram (CELEXA) 20 MG tablet TAKE 1 TABLET (20 MG TOTAL) BY MOUTH DAILY. 30 tablet 5  . clopidogrel (PLAVIX) 75 MG tablet TAKE 1 TABLET (75 MG TOTAL) BY MOUTH EVERY MORNING. 90 tablet 2  . clopidogrel (PLAVIX) 75 MG tablet Take 75 mg by mouth daily.    . CRESTOR 40 MG tablet TAKE 1 TABLET (40 MG TOTAL) BY MOUTH DAILY. 30 tablet 1  . cyclobenzaprine (FLEXERIL) 10 MG tablet Take 10 mg by mouth 2 (two) times daily.    . cyclobenzaprine (FLEXERIL) 10 MG tablet Take 10 mg by mouth 2 (two) times daily.    [name] Kitchen enoxaparin (LOVENOX) 150 MG/ML injection Inject 0.45 mLs (70 mg total) into the skin every 12 (twelve) hours. 14 Syringe 0  . HYDROcodone-acetaminophen (NORCO/VICODIN) 5-325 MG tablet Take 1-2 tablets by mouth every 4 (four) hours as needed for moderate pain. 30 tablet 0  . omeprazole (PRILOSEC) 20 MG capsule TAKE 1 CAPSULE (20 MG TOTAL) BY MOUTH 2 (TWO)  TIMES DAILY BEFORE A MEAL. (Patient taking differently: Take 20 mg by mouth daily. TAKE 1 CAPSULE (20 MG TOTAL) BY MOUTH 2 (TWO) TIMES DAILY BEFORE A MEAL.) 180 capsule 1  . omeprazole (PRILOSEC) 20 MG capsule Take 20 mg by mouth 2 (two) times daily before a meal.    . thiamine (VITAMIN B-1) 100 MG tablet Take 1 tablet (100 mg total) by mouth daily. 90 tablet 2  . warfarin (COUMADIN) 5 MG tablet Take 1 tablet (5 mg total) by mouth daily. 30 tablet 3   No current facility-administered medications for this visit.     SURGICAL HISTORY:  Past Surgical History:  Procedure Laterality Date  . ANGIOPLASTY / STENTING ILIAC  08/2008   Dr. Trula Slade  .  BACK SURGERY  1975, 1978   x2 lumbar fusion over 30 years ago  . BACK SURGERY    . COLONOSCOPY  05/11/2006; 07/27/2006;05/05/2007, 11/24/2010   adenomas with 9 polyps index and 2.5 cm cecal polyp removed over 3 exams, last 2009 with APC. diverticulosis also.  2012: 12 adenomas removed, largest 8-10 mm, diverticulosis  . CORONARY ANGIOPLASTY WITH STENT PLACEMENT    . DESCENDING AORTIC ANEURYSM REPAIR W/ STENT  04/2010   Dr. Trula Slade  . ESOPHAGOGASTRODUODENOSCOPY  05/05/2007   GERD - one erosion  . EYE SURGERY Bilateral    cataract  . KNEE SURGERY  2002   left- cartilage   . LUNG BIOPSY    . PERCUTANEOUS PINNING Left 06/24/2012   Procedure: Repair Complex Laceration Left Thumb with percutaneous pinning.;  Surgeon: Dennie Bible, MD;  Location: [facility];  Service: Plastics;  Laterality: Left;  . THUMB AMPUTATION Left    pinning and index finger  . TONSILLECTOMY  1956  . TONSILLECTOMY    . VIDEO ASSISTED THORACOSCOPY (VATS)/WEDGE RESECTION Right 01/24/2015   Procedure: RIGHT VIDEO ASSISTED THORACOSCOPY (VATS)/WEDGE RESECTION;  Surgeon: [name], MD;  Location: [facility];  Service: Thoracic;  Laterality: Right;    REVIEW OF SYSTEMS:  A comprehensive review of systems was negative.   PHYSICAL EXAMINATION: General appearance: alert, cooperative and no distress Head: Normocephalic, without obvious abnormality, atraumatic Neck: no adenopathy, no JVD, supple, symmetrical, trachea midline and thyroid not enlarged, symmetric, no tenderness/mass/nodules Lymph nodes: Cervical, supraclavicular, and axillary nodes normal. Resp: clear to auscultation bilaterally Back: symmetric, no curvature. ROM normal. No CVA tenderness. Cardio: regular rate and rhythm, S1, S2 normal, no murmur, click, rub or gallop GI: soft, non-tender; bowel sounds normal; no masses,  no organomegaly Extremities: extremities normal, atraumatic, no cyanosis or edema  ECOG PERFORMANCE STATUS: 0 - Asymptomatic  Blood pressure  (!) 167/77, pulse 94, temperature 98.2 F (36.8 C), temperature source Oral, resp. rate 19, height '5\' 9"'$  (1.753 m), weight 146 lb 9.6 oz (66.5 kg), SpO2 100 %.  LABORATORY DATA: Lab Results  Component Value Date   WBC 6.6 10/03/2015   HGB 10.9 (L) 10/03/2015   HCT 34.5 (L) 10/03/2015   MCV 77.9 (L) 10/03/2015   PLT 328 10/03/2015      Chemistry      Component Value Date/Time   NA 140 10/03/2015 1222   K 4.1 10/03/2015 1222   CL 106 08/15/2015 1543   CO2 23 10/03/2015 1222   BUN 12.2 10/03/2015 1222   CREATININE 0.9 10/03/2015 1222      Component Value Date/Time   CALCIUM 9.3 10/03/2015 1222   ALKPHOS 101 10/03/2015 1222   AST 10 10/03/2015 1222   ALT <9 10/03/2015 1222  BILITOT 0.54 10/03/2015 1222       RADIOGRAPHIC STUDIES: Ct Chest W Contrast  Result Date: 10/03/2015 CLINICAL DATA:  Lung cancer diagnosed 4/16. Right-sided lung resection. iliac stent. Aortic aneurysm repair. EXAM: CT CHEST WITH CONTRAST TECHNIQUE: Multidetector CT imaging of the chest was performed during intravenous contrast administration. CONTRAST:  3m ISOVUE-300 IOPAMIDOL (ISOVUE-300) INJECTION 61% COMPARISON:  08/05/2015 FINDINGS: Cardiovascular: Aortic and branch vessel atherosclerosis. Normal heart size, without pericardial effusion. Multivessel coronary artery atherosclerosis. No residual pulmonary embolism identified on this nondedicated study (right sided emboli on the prior exam). Mediastinum/Nodes: No supraclavicular adenopathy. Multiple small middle mediastinal nodes. None are pathologic by size criteria. No hilar adenopathy. Lungs/Pleura: No pleural fluid. Status post wedge resection within the right middle and likely right upper lobes. No locally recurrent disease. No dominant macroscopic pulmonary nodule identified. There is subtle bilateral upper lobe micro nodularity which is felt to be similar to on the prior exam. Felt to be present back to 07/03/2013. Upper Abdomen: Hepatic cysts. Normal  imaged portions of the spleen, pancreas, gallbladder. Apparent proximal gastric wall thickening is superimposed upon underdistention. Example 2.5 cm. This is present over multiple prior exams including back to 2015. Left worse than right adrenal thickening is not significantly changed. Normal imaged left kidney. Incompletely imaged right renal lesion measures 4.7 cm and fluid density, likely a cyst. Abdominal aortic atherosclerosis. Musculoskeletal: Healing left third and fourth antro lateral rib fractures. Callus deposition secondary to healing of the seventh and eighth lateral right ribs. No convincing evidence of osseous metastasis. Incompletely imaged superior endplate compression deformity at L2 is chronic. IMPRESSION: 1. Status post right-sided wedge resection or resections. No recurrent or metastatic disease identified. 2. Upper lobe predominant pulmonary micro nodularity, felt to be chronic over multiple exams and possibly related to respiratory bronchiolitis. 3.  Coronary artery atherosclerosis. Aortic atherosclerosis. 4. No residual pulmonary embolism identified on this nondedicated study. 5. Healing bilateral rib fractures. 6. Chronic gastric wall thickening, superimposed upon underdistention. Suspicious for gastritis. Electronically Signed   By: KAbigail MiyamotoM.D.   On: 10/03/2015 17:00    ASSESSMENT AND PLAN: This is a very pleasant 75years old white male with stage IA non-small cell lung cancer status post resection and currently on observation. The recent CT scan of the chest showed no evidence for disease recurrence. I discussed the scan results with the patient and his daughter. I recommended for him to continue on observation with repeat CT scan of the chest in 6 months. Regarding the lytic and sclerotic lesion seen on the previous imaging studies, I will order bone scan for further evaluation of these lesions and I will see the patient sooner if there is any concerning finding on the bone  scan. For the iron deficiency anemia, I recommended for the patient to start taking oral iron tablets 1-2 tablets every day. He was advised to call immediately if he has any concerning symptoms in the interval. The patient voices understanding of current disease status and treatment options and is in agreement with the current care plan.  All questions were answered. The patient knows to call the clinic with any problems, questions or concerns. We can certainly see the patient much sooner if necessary.   Disclaimer: This note was dictated with voice recognition software. Similar sounding words can inadvertently be transcribed and may not be corrected upon review.

## 2015-10-08 ENCOUNTER — Encounter: Payer: Self-pay | Admitting: Thoracic Surgery (Cardiothoracic Vascular Surgery)

## 2015-10-08 ENCOUNTER — Ambulatory Visit (INDEPENDENT_AMBULATORY_CARE_PROVIDER_SITE_OTHER): Payer: Medicare HMO | Admitting: Thoracic Surgery (Cardiothoracic Vascular Surgery)

## 2015-10-08 ENCOUNTER — Ambulatory Visit: Payer: Medicare HMO | Admitting: Neurology

## 2015-10-08 ENCOUNTER — Ambulatory Visit
Admission: RE | Admit: 2015-10-08 | Discharge: 2015-10-08 | Disposition: A | Discharge status: Home / Self Care | Payer: Medicare HMO | Source: Ambulatory Visit | Attending: Thoracic Surgery (Cardiothoracic Vascular Surgery) | Admitting: Thoracic Surgery (Cardiothoracic Vascular Surgery)

## 2015-10-08 VITALS — BP 143/86 | HR 95 | Resp 16 | Ht 69.0 in | Wt 146.0 lb

## 2015-10-08 DIAGNOSIS — J449 Chronic obstructive pulmonary disease, unspecified: Secondary | ICD-10-CM | POA: Diagnosis not present

## 2015-10-08 DIAGNOSIS — C342 Malignant neoplasm of middle lobe, bronchus or lung: Secondary | ICD-10-CM | POA: Diagnosis not present

## 2015-10-08 DIAGNOSIS — Z09 Encounter for follow-up examination after completed treatment for conditions other than malignant neoplasm: Secondary | ICD-10-CM

## 2015-10-08 DIAGNOSIS — C3490 Malignant neoplasm of unspecified part of unspecified bronchus or lung: Secondary | ICD-10-CM

## 2015-10-08 NOTE — Progress Notes (Signed)
[address]       [place] [address]             [phone]       HPI: [name] returns today for scheduled follow-up appointment.  He is a 75 year old smoker history of a stage Ia non-small cell carcinoma. I did a wedge resection for groundglass opacity on 01/24/2015. This turned out to be a stage IA well-differentiated adenocarcinoma. He also had a couple of necrotizing granulomas.  I last saw him in the office in November. That time he was doing well.  He continues to smoke. He otherwise feels well. He does not have any residual pain from the surgery. He saw Dr. Julien Nordmann yesterday. Past Medical History:  Diagnosis Date  . Adenocarcinoma of lung, stage 1 ([place])   . Arthritis   . Blood clotting disorder ([facility])   . Chronic obstructive pulmonary disease (COPD) ([facility])    "patient not aware"  . Depression   . Diverticulosis   . DVT (deep venous thrombosis) ([facility])    Post op  knee surgery  . DVT (deep venous thrombosis) ([facility])   . GERD (gastroesophageal reflux disease)   . Histoplasmosis   . Hypercholesteremia   . Hyperlipidemia   . Hypertension   . Lung cancer ([place])   . Personal history of colonic polyps 2008   multiple adenomas 2008-2009 exams with large cecal adenoma  . Pneumonia    in history  . Pulmonary nodule 06/2006   9 mm right upper obe pulmonary nodule (negative Pet Scan)  . PVD (peripheral vascular disease) ([facility])    left leg s/p stent  . Sebaceous cyst    neck  . Seborrheic keratosis   . Sinusitis   . Tobacco abuse   . Tremor         Current Outpatient Prescriptions  Medication Sig Dispense Refill  . citalopram (CELEXA) 20 MG tablet TAKE 1 TABLET (20 MG TOTAL) BY MOUTH DAILY. 30 tablet 5  . omeprazole (PRILOSEC) 20 MG capsule TAKE 1 CAPSULE (20 MG TOTAL) BY MOUTH 2 (TWO) TIMES DAILY BEFORE A MEAL. (Patient taking differently: Take 20 mg by mouth daily. TAKE 1 CAPSULE (20 MG TOTAL) BY MOUTH 2 (TWO) TIMES DAILY BEFORE A MEAL.) 180  capsule 1  . omeprazole (PRILOSEC) 20 MG capsule Take 20 mg by mouth 2 (two) times daily before a meal.    . thiamine (VITAMIN B-1) 100 MG tablet Take 1 tablet (100 mg total) by mouth daily. 90 tablet 2  . warfarin (COUMADIN) 5 MG tablet Take 1 tablet (5 mg total) by mouth daily. 30 tablet 3   No current facility-administered medications for this visit.     Physical Exam BP (!) 143/86   Pulse 95   Resp 16   Ht '5\' 9"'$  (1.753 m)   Wt 146 lb (66.2 kg)   SpO2 96% Comment: ON RA  BMI 21.68 kg/m  75 year old man in no acute distress Alert and oriented 3 with no focal deficits Cardiac regular rate and rhythm normal S1 and S2 Lungs clear with vessels bilaterally No cervical or supraclavicular lymphadenopathy  Diagnostic Tests: CT CHEST WITH CONTRAST  TECHNIQUE: Multidetector CT imaging of the chest was performed during intravenous contrast administration.  CONTRAST:  67m ISOVUE-300 IOPAMIDOL (ISOVUE-300) INJECTION 61%  COMPARISON:  08/05/2015  FINDINGS: Cardiovascular: Aortic and branch vessel atherosclerosis. Normal heart size, without pericardial effusion. Multivessel coronary artery atherosclerosis. No residual pulmonary embolism identified on this nondedicated study (  right sided emboli on the prior exam).  Mediastinum/Nodes: No supraclavicular adenopathy. Multiple small middle mediastinal nodes. None are pathologic by size criteria. No hilar adenopathy.  Lungs/Pleura: No pleural fluid. Status post wedge resection within the right middle and likely right upper lobes. No locally recurrent disease. No dominant macroscopic pulmonary nodule identified. There is subtle bilateral upper lobe micro nodularity which is felt to be similar to on the prior exam. Felt to be present back to 07/03/2013.  Upper Abdomen: Hepatic cysts. Normal imaged portions of the spleen, pancreas, gallbladder. Apparent proximal gastric wall thickening is superimposed upon underdistention.  Example 2.5 cm. This is present over multiple prior exams including back to 2015.  Left worse than right adrenal thickening is not significantly changed. Normal imaged left kidney. Incompletely imaged right renal lesion measures 4.7 cm and fluid density, likely a cyst. Abdominal aortic atherosclerosis.  Musculoskeletal: Healing left third and fourth antro lateral rib fractures.  Callus deposition secondary to healing of the seventh and eighth lateral right ribs. No convincing evidence of osseous metastasis. Incompletely imaged superior endplate compression deformity at L2 is chronic.  IMPRESSION: 1. Status post right-sided wedge resection or resections. No recurrent or metastatic disease identified. 2. Upper lobe predominant pulmonary micro nodularity, felt to be chronic over multiple exams and possibly related to respiratory bronchiolitis. 3.  Coronary artery atherosclerosis. Aortic atherosclerosis. 4. No residual pulmonary embolism identified on this nondedicated study. 5. Healing bilateral rib fractures. 6. Chronic gastric wall thickening, superimposed upon underdistention. Suspicious for gastritis.   Electronically Signed   By: Abigail Miyamoto M.D.   On: 10/03/2015 17:00  Impression: 75 year old man who had a stage IA well-differentiated adenocarcinoma resected in November 2016. He has no evidence of recurrent disease. Dr. Julien Nordmann is planning to do a bone scan to further evaluate some unusual findings on the CT.  He has no surgical issues at this time. He is being followed by Dr. Julien Nordmann for his lung cancer.  I will be happy to see him back any time if I can be of any further assistance with his care. Plan:   [name], MD [facility] [phone]

## 2015-10-15 ENCOUNTER — Ambulatory Visit (INDEPENDENT_AMBULATORY_CARE_PROVIDER_SITE_OTHER): Payer: Medicare HMO | Admitting: General Practice

## 2015-10-15 ENCOUNTER — Other Ambulatory Visit (INDEPENDENT_AMBULATORY_CARE_PROVIDER_SITE_OTHER): Payer: Medicare HMO

## 2015-10-15 DIAGNOSIS — Z0000 Encounter for general adult medical examination without abnormal findings: Secondary | ICD-10-CM | POA: Diagnosis not present

## 2015-10-15 DIAGNOSIS — Z125 Encounter for screening for malignant neoplasm of prostate: Secondary | ICD-10-CM | POA: Diagnosis not present

## 2015-10-15 DIAGNOSIS — Z5181 Encounter for therapeutic drug level monitoring: Secondary | ICD-10-CM

## 2015-10-15 LAB — HEPATIC FUNCTION PANEL
ALBUMIN: 4.1 g/dL (ref 3.5–5.2)
ALK PHOS: 98 U/L (ref 39–117)
ALT: 7 U/L (ref 0–53)
AST: 11 U/L (ref 0–37)
BILIRUBIN DIRECT: 0.1 mg/dL (ref 0.0–0.3)
TOTAL PROTEIN: 6.5 g/dL (ref 6.0–8.3)
Total Bilirubin: 0.4 mg/dL (ref 0.2–1.2)

## 2015-10-15 LAB — CBC WITH DIFFERENTIAL/PLATELET
BASOS PCT: 0.3 % (ref 0.0–3.0)
Basophils Absolute: 0 10*3/uL (ref 0.0–0.1)
EOS ABS: 0.1 10*3/uL (ref 0.0–0.7)
Eosinophils Relative: 2.2 % (ref 0.0–5.0)
HEMATOCRIT: 31.4 % — AB (ref 39.0–52.0)
HEMOGLOBIN: 10 g/dL — AB (ref 13.0–17.0)
LYMPHS PCT: 21.4 % (ref 12.0–46.0)
Lymphs Abs: 1 10*3/uL (ref 0.7–4.0)
MCHC: 31.7 g/dL (ref 30.0–36.0)
MCV: 75.5 fl — ABNORMAL LOW (ref 78.0–100.0)
Monocytes Absolute: 0.7 10*3/uL (ref 0.1–1.0)
Monocytes Relative: 14 % — ABNORMAL HIGH (ref 3.0–12.0)
Neutro Abs: 3 10*3/uL (ref 1.4–7.7)
Neutrophils Relative %: 62.1 % (ref 43.0–77.0)
Platelets: 314 10*3/uL (ref 150.0–400.0)
RBC: 4.16 Mil/uL — ABNORMAL LOW (ref 4.22–5.81)
RDW: 18.4 % — AB (ref 11.5–15.5)
WBC: 4.9 10*3/uL (ref 4.0–10.5)

## 2015-10-15 LAB — BASIC METABOLIC PANEL
BUN: 12 mg/dL (ref 6–23)
CHLORIDE: 105 meq/L (ref 96–112)
CO2: 28 mEq/L (ref 19–32)
CREATININE: 0.94 mg/dL (ref 0.40–1.50)
Calcium: 8.9 mg/dL (ref 8.4–10.5)
GFR: 83.25 mL/min (ref >60.00)
Glucose, Bld: 102 mg/dL — ABNORMAL HIGH (ref 70–99)
POTASSIUM: 3.9 meq/L (ref 3.5–5.1)
Sodium: 140 mEq/L (ref 135–145)

## 2015-10-15 LAB — LIPID PANEL
CHOLESTEROL: 179 mg/dL (ref 0–200)
HDL: 42.3 mg/dL (ref >39.00)
LDL CALC: 114 mg/dL — AB (ref 0–99)
NonHDL: 137.05
Total CHOL/HDL Ratio: 4
Triglycerides: 113 mg/dL (ref 0.0–149.0)
VLDL: 22.6 mg/dL (ref 0.0–40.0)

## 2015-10-15 LAB — TSH: TSH: 1.77 u[IU]/mL (ref 0.35–4.50)

## 2015-10-15 LAB — PSA: PSA: 0.71 ng/mL (ref 0.10–4.00)

## 2015-10-15 LAB — POCT INR: INR: 4.3

## 2015-10-18 ENCOUNTER — Ambulatory Visit (HOSPITAL_COMMUNITY)
Admission: RE | Admit: 2015-10-18 | Discharge: 2015-10-18 | Disposition: A | Discharge status: Home / Self Care | Payer: Medicare HMO | Source: Ambulatory Visit | Attending: Internal Medicine | Admitting: Internal Medicine

## 2015-10-18 ENCOUNTER — Encounter (HOSPITAL_COMMUNITY): Payer: Self-pay

## 2015-10-18 ENCOUNTER — Encounter (HOSPITAL_COMMUNITY)
Admission: RE | Admit: 2015-10-18 | Discharge: 2015-10-18 | Disposition: A | Discharge status: Home / Self Care | Payer: Medicare HMO | Source: Ambulatory Visit | Attending: Internal Medicine | Admitting: Internal Medicine

## 2015-10-18 DIAGNOSIS — C3490 Malignant neoplasm of unspecified part of unspecified bronchus or lung: Secondary | ICD-10-CM | POA: Diagnosis not present

## 2015-10-18 DIAGNOSIS — C3491 Malignant neoplasm of unspecified part of right bronchus or lung: Secondary | ICD-10-CM | POA: Diagnosis not present

## 2015-10-18 DIAGNOSIS — M899 Disorder of bone, unspecified: Secondary | ICD-10-CM | POA: Insufficient documentation

## 2015-10-18 MED ORDER — TECHNETIUM TC 99M MEDRONATE IV KIT
25.0000 | PACK | Freq: Once | INTRAVENOUS | Status: AC | PRN
  Administered 2015-10-18: 26.2 via INTRAVENOUS

## 2015-10-22 ENCOUNTER — Ambulatory Visit (INDEPENDENT_AMBULATORY_CARE_PROVIDER_SITE_OTHER): Payer: Medicare HMO | Admitting: Family Medicine

## 2015-10-22 ENCOUNTER — Encounter: Payer: Self-pay | Admitting: Family Medicine

## 2015-10-22 VITALS — BP 154/90 | HR 88 | Resp 12 | Ht 69.0 in | Wt 152.2 lb

## 2015-10-22 DIAGNOSIS — Z0000 Encounter for general adult medical examination without abnormal findings: Secondary | ICD-10-CM | POA: Diagnosis not present

## 2015-10-22 DIAGNOSIS — I10 Essential (primary) hypertension: Secondary | ICD-10-CM

## 2015-10-22 DIAGNOSIS — Z1211 Encounter for screening for malignant neoplasm of colon: Secondary | ICD-10-CM | POA: Diagnosis not present

## 2015-10-22 MED ORDER — AMLODIPINE BESYLATE 5 MG PO TABS: 5.0000 mg | ORAL_TABLET | Freq: Every day | ORAL | 1 refills | Status: DC

## 2015-10-22 NOTE — Progress Notes (Signed)
HPI:  [name] is a 75 y.o.male here today with his dog for his routine physical examination.  -Concerns and/or follow up today: HTN.  He does  follow a otherwise healthier diet and more frequent that he did before, he feels much better since 07/2015 when I saw him last after hospital discharged. No meal schedule, he eats when he feels like doing so. He does not exercise regularly.   Chronic medical problems: HTN,PE on anticoagulation,CAD, PVD,COPD, lung cancer, GERD, and HLD among some.   Hypertension:  Currently he is on non pharmacologic treatment, meds were discontinued during hospitalization in 07/2015 because hypotension. He states that BP is steadily going up. BP readings at home 150's/90's.   He has not noted unusual headache, visual changes, exertional chest pain, dyspnea,  focal weakness, or edema.   Lab Results  Component Value Date   CREATININE 0.94 10/15/2015   BUN 12 10/15/2015   NA 140 10/15/2015   K 3.9 10/15/2015   CL 105 10/15/2015   CO2 28 10/15/2015     Hx of STD's: denies. No sexually active. ADL's and IADL's independent, except for mild unstable gait, which has improved after PT , he is not using any assistance.  Last colon cancer screening: 04/2007, he was supposed to have 2 years follow up. Last prostate ca screening: 2017, he had recent labs.  -Hx of acohol abuse, has not drunk any since hospital discharge, 07/2015. + Tobacco use, smoker, not willing to quit. Denies illicit drug use.  He lives alone, daughter visits 3 times per week.  Hx of depression and anxiety, he is on Celexa 20 mg, He has been taking medication for years. Mood "generally good", denies crying spells or suicidal thoughts.    Hx of lung cancer, stage IA well-differentiated adenocarcinoma, s/p wedge resection. He is currently following with thoracic surgeon, seen 10/08/15 ( Dr Roxan Hockey) and with oncologists, Dr Julien Nordmann. According to pt, he had PET scan  last Friday,arranged by Dr Julien Nordmann to evaluate liver lesions seen on chest CT and abdominal CT done in 07/2015 during ER visit.  08/05/15 Chest CT: Nonocclusive right lower lobar pulmonary embolus.   Numerous hypoattenuated lesions throughout the liver, which measure water density, and by CT criteria represent cysts. Alternatively these may represent necrotic metastatic lesions.   Acute superior endplate L2 and L3 vertebral body fractures with grade 1 compression deformities and no alignment abnormality.   Mixed lytic and sclerotic right iliac bone lesion ; metastatic disease cannot be excluded. Sclerotic appearance of the left scapula.   Likely postsurgical scarring within the right upper and middle lobes of the lung.    -Following with ortho for vertebral Fx, next week last appt, wearing a lumbar brace. -Neurologists seen 09/25/15 because essential tremor.  No recent falls, last one in 07/2015 right before his hospitalization (08/05/15).      Review of Systems  Constitutional: Negative for activity change, appetite change, fatigue, fever and unexpected weight change.  HENT: Positive for hearing loss (mild, mainly when background noise) and tinnitus (chronic, bilateral). Negative for dental problem, nosebleeds, sore throat, trouble swallowing and voice change.   Eyes: Negative for redness and visual disturbance.  Respiratory: Negative for apnea, cough, shortness of breath and wheezing.   Cardiovascular: Negative for chest pain, palpitations and leg swelling.  Gastrointestinal: Negative for abdominal pain, blood in stool, nausea and vomiting.       No changes in bowel habits.  Endocrine: Negative for polydipsia and polyuria.  Genitourinary: Negative for decreased urine volume, dysuria, genital sores, hematuria, penile swelling and testicular pain.  Musculoskeletal: Positive for arthralgias and back pain. Negative for myalgias.  Skin: Negative for color change and rash.    Neurological: Positive for light-headedness (occasionally. When turning fast). Negative for seizures, syncope, weakness, numbness and headaches.  Hematological: Negative for adenopathy. Bruises/bleeds easily (bruise easier since he started Coumadin).  Psychiatric/Behavioral: Negative for confusion, hallucinations, sleep disturbance and suicidal ideas. The patient is not nervous/anxious.      Current Outpatient Prescriptions on File Prior to Visit  Medication Sig Dispense Refill  . citalopram (CELEXA) 20 MG tablet TAKE 1 TABLET (20 MG TOTAL) BY MOUTH DAILY. 30 tablet 5  . omeprazole (PRILOSEC) 20 MG capsule TAKE 1 CAPSULE (20 MG TOTAL) BY MOUTH 2 (TWO) TIMES DAILY BEFORE A MEAL. (Patient taking differently: Take 20 mg by mouth daily. TAKE 1 CAPSULE (20 MG TOTAL) BY MOUTH 2 (TWO) TIMES DAILY BEFORE A MEAL.) 180 capsule 1  . omeprazole (PRILOSEC) 20 MG capsule Take 20 mg by mouth 2 (two) times daily before a meal.    . thiamine (VITAMIN B-1) 100 MG tablet Take 1 tablet (100 mg total) by mouth daily. 90 tablet 2  . warfarin (COUMADIN) 5 MG tablet Take 1 tablet (5 mg total) by mouth daily. 30 tablet 3   No current facility-administered medications on file prior to visit.      Past Medical History:  Diagnosis Date  . Adenocarcinoma of lung, stage 1 ([place])   . Arthritis   . Blood clotting disorder ([place])   . Chronic obstructive pulmonary disease (COPD) ([place])    "patient not aware"  . Depression   . Diverticulosis   . DVT (deep venous thrombosis) ([place])    Post op  knee surgery  . DVT (deep venous thrombosis) ([place])   . GERD (gastroesophageal reflux disease)   . Histoplasmosis   . Hypercholesteremia   . Hyperlipidemia   . Hypertension   . Lung cancer ([place])   . Personal history of colonic polyps 2008   multiple adenomas 2008-2009 exams with large cecal adenoma  . Pneumonia    in history  . Pulmonary nodule 06/2006   9 mm right upper obe pulmonary nodule (negative Pet Scan)  . PVD  (peripheral vascular disease) ([place])    left leg s/p stent  . Sebaceous cyst    neck  . Seborrheic keratosis   . Sinusitis   . Tobacco abuse   . Tremor     Allergies  Allergen Reactions  . Hydrocodone Itching  . Hydrocodone Itching    Family History  Problem Relation Age of Onset  . Colon cancer Mother 28  . Deep vein thrombosis Mother   . Lung cancer Son     smoked and was a Building control surveyor  . Heart disease Brother   . Diabetes    . Heart disease Brother   . Rheum arthritis Daughter   . Rheum arthritis Sister     Social History   Social History  . Marital status: Married    Spouse name: N/A  . Number of children: 2  . Years of education: N/A   Occupational History  . Retired Retired   Social History Main Topics  . Smoking status: Current Every Day Smoker    Packs/day: 0.50    Years: 60.00    Types: Cigarettes  . Smokeless tobacco: Never Used  . Alcohol use 0.0 oz/week     Comment: quit drinking after last  hospitalization- may 2017  . Drug use: No  . Sexual activity: Not Currently   Other Topics Concern  . None   Social History Narrative   ** Merged History Encounter **        Married for over 50 years.  Wife died from lung cancer.   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   Occasional alcohol.    Tobacco use - over 50 pack years. Father died at age 76 of suicide. Michele Ellis-daughter emergenc   y contact     Vitals:   10/22/15 0957  BP: (!) 154/90  Pulse: 88  Resp: 12   Body mass index is 22.48 kg/m.  O2 sat RA 96%  Wt Readings from Last 3 Encounters:  10/22/15 152 lb 4 oz (69.1 kg)  10/08/15 146 lb (66.2 kg)  10/07/15 146 lb 9.6 oz (66.5 kg)        Physical Exam  Constitutional: He is oriented to person, place, and time. He appears well-developed and well-nourished. No distress.  HENT:  Head: Atraumatic.  Right Ear: Hearing, tympanic membrane, external ear and ear canal normal.  Left Ear: Hearing,  tympanic membrane, external ear and ear canal normal.  Mouth/Throat: Oropharynx is clear and moist and mucous membranes are normal.  Eyes: Conjunctivae and EOM are normal. Pupils are equal, round, and reactive to light.  Neck: Normal range of motion. No thyromegaly present.  Cardiovascular: Normal rate and regular rhythm.   No murmur heard. DP pulses present bilateral.  Respiratory: Effort normal and breath sounds normal. No respiratory distress.  GI: Soft. He exhibits no mass. There is no tenderness.  Genitourinary:  Genitourinary Comments: Refused, no concerns.  Musculoskeletal: He exhibits edema (trace pitting LE edema bilateral, 1+ pedal/ankle  bilateral). He exhibits no tenderness.  No major deformities appreciated and no signs of synovitis.  Lymphadenopathy:    He has no cervical adenopathy.       Right: No supraclavicular adenopathy present.       Left: No supraclavicular adenopathy present.  Neurological: He is alert and oriented to person, place, and time. He has normal strength. He displays tremor (minimal noted on hands, with intension. Also head.). No cranial nerve deficit or sensory deficit. Coordination normal.  Stable gait without assistance  Skin: Skin is warm. No erythema.  Psychiatric: He has a normal mood and affect. His speech is normal.  Well groomed, good eye contact.        ASSESSMENT AND PLAN:   Discussed the following assessment and plan:   Timothy Wood was seen today for annual exam.  Diagnoses and all orders for this visit:  Physical exam, routine  -Current preventive preventive guidelines discussed. Discussed the importance of a healthy diet and regular exercise for prevention of complications on chronic medical conditions. Encouraged to quit smoking, not interested for now. Fall precautions discussed.  Next routine physical in 1 year.  -     Ambulatory referral to Gastroenterology  Essential hypertension  Resume Amlodipine but lower dose, 5 mg   Tab but instructed to start with 1/2 tab and continue monitoring BP. Amlodipine dose can be increased in 3 weeks if BP still > 140/90. Low salt diet. Instructed about warning signs.  -     amLODipine (NORVASC) 5 MG tablet; Take 1 tablet (5 mg total) by mouth daily.  Colon cancer screening -     Ambulatory referral to Gastroenterology   Recent labs reviewed. We also discussed the importance of  consistency with green intake, which can affect INR.         Return in about 6 months (around 04/23/2016) for HTN and depression.      Timothy Meisinger G. [name], MD  [facility] [place] office.

## 2015-10-22 NOTE — Progress Notes (Signed)
Pre visit review using our clinic review tool, if applicable. No additional management support is needed unless otherwise documented below in the visit note. 

## 2015-10-22 NOTE — Patient Instructions (Signed)
A few things to remember from today's visit:   Physical exam, routine  Essential hypertension - Plan: amLODipine (NORVASC) 5 MG tablet    At least 150 minutes of moderate exercise per week, daily brisk walking for 15-30 min is a good exercise option. Healthy diet low in saturated (animal) fats and sweets and consisting of fresh fruits and vegetables, lean meats such as fish and white chicken and whole grains.  - Vaccines:  Tdap vaccine every 10 years.  Shingles vaccine recommended at age 11, could be given after 75 years of age but not sure about insurance coverage.  Pneumonia vaccines:  Prevnar 13 at 65 and Pneumovax at 61.  No alcohol.  A few tips:  -As we age balance is not as good as it was, so there is a higher risks for falls. Please remove small rugs and furniture that is "in your way" and could increase the risk of falls. Stretching exercises may help with fall prevention: Yoga and Tai Chi are some examples. Low impact exercise is better, so you are not very achy the next day.  -Sun screen and avoidance of direct sun light recommended. Caution with dehydration, if working outdoors be sure to drink enough fluids.  - Some medications are not safe as we age, increases the risk of side effects and can potentially interact with other medication you are also taken;  including some of over the counter medications. Be sure to let me know when you start a new medication even if it is a dietary/vitamin supplement.           Colonoscopy for colon cancer screening at age 57 and until age 34.  Prostate cancer screening: some controversy.        Please be sure medication list is accurate. If a new problem present, please set up appointment sooner than planned today.

## 2015-11-06 ENCOUNTER — Ambulatory Visit (INDEPENDENT_AMBULATORY_CARE_PROVIDER_SITE_OTHER): Payer: Medicare HMO | Admitting: General Practice

## 2015-11-06 DIAGNOSIS — Z5181 Encounter for therapeutic drug level monitoring: Secondary | ICD-10-CM

## 2015-11-06 LAB — POCT INR: INR: 1.2

## 2015-11-17 DIAGNOSIS — K219 Gastro-esophageal reflux disease without esophagitis: Secondary | ICD-10-CM | POA: Diagnosis not present

## 2015-11-17 DIAGNOSIS — I10 Essential (primary) hypertension: Secondary | ICD-10-CM | POA: Diagnosis not present

## 2015-11-17 DIAGNOSIS — Z0000 Encounter for general adult medical examination without abnormal findings: Secondary | ICD-10-CM | POA: Diagnosis not present

## 2015-11-20 ENCOUNTER — Ambulatory Visit (INDEPENDENT_AMBULATORY_CARE_PROVIDER_SITE_OTHER): Payer: Medicare HMO | Admitting: General Practice

## 2015-11-20 DIAGNOSIS — Z5181 Encounter for therapeutic drug level monitoring: Secondary | ICD-10-CM | POA: Diagnosis not present

## 2015-11-20 LAB — POCT INR: INR: 2.4

## 2015-11-22 ENCOUNTER — Encounter: Payer: Self-pay | Admitting: Internal Medicine

## 2015-12-03 ENCOUNTER — Telehealth: Payer: Self-pay | Admitting: Family Medicine

## 2015-12-03 MED ORDER — AMLODIPINE BESYLATE 10 MG PO TABS: 10.0000 mg | ORAL_TABLET | Freq: Every day | ORAL | 1 refills | Status: DC

## 2015-12-03 NOTE — Telephone Encounter (Signed)
° °  Pt call to say BP is running high. Below is some examples    162/89  119/54  131/65  151/82  154/90

## 2015-12-03 NOTE — Telephone Encounter (Signed)
Tried contacting patient, but voicemail was not set up on cell phone & home phone just keeps ringing. Patient is currently on '5mg'$ , so I will send new Rx for 10 mg.

## 2015-12-03 NOTE — Telephone Encounter (Signed)
Is he on Amlodipine 5 mg to 10 mg? If taking 5 mg he can increase it to 10 mg.  Thanks, BJ

## 2015-12-03 NOTE — Telephone Encounter (Signed)
See below

## 2015-12-04 NOTE — Telephone Encounter (Signed)
Dizziness could be due to orthostatic hypotension. Can BP be checked when he has dizziness? Also can orthostatic VS done by [facility]? Most antihypertensive meds can cause dizziness. Continue Amlodipine 5 mg daily for now, low salt diet, and fall precautions.  Thanks, BJ

## 2015-12-04 NOTE — Telephone Encounter (Signed)
Called and spoke with patient, he said the amlodipine makes him dizzy. I told him to hold off on picking up the '10mg'$  Rx for now. Is there a different medication he can take?

## 2015-12-05 NOTE — Telephone Encounter (Signed)
Patient is coming in tomorrow at 9:30 to be evaluated.

## 2015-12-06 ENCOUNTER — Encounter: Payer: Self-pay | Admitting: Family Medicine

## 2015-12-06 ENCOUNTER — Ambulatory Visit (INDEPENDENT_AMBULATORY_CARE_PROVIDER_SITE_OTHER): Payer: Medicare HMO | Admitting: Family Medicine

## 2015-12-06 VITALS — BP 150/90 | HR 86 | Temp 97.7°F | Resp 12 | Ht 69.0 in | Wt 162.2 lb

## 2015-12-06 DIAGNOSIS — R42 Dizziness and giddiness: Secondary | ICD-10-CM | POA: Diagnosis not present

## 2015-12-06 DIAGNOSIS — I10 Essential (primary) hypertension: Secondary | ICD-10-CM

## 2015-12-06 LAB — BASIC METABOLIC PANEL
BUN: 10 mg/dL (ref 6–23)
CALCIUM: 8.8 mg/dL (ref 8.4–10.5)
CHLORIDE: 105 meq/L (ref 96–112)
CO2: 29 meq/L (ref 19–32)
CREATININE: 0.79 mg/dL (ref 0.40–1.50)
GFR: 101.7 mL/min (ref >60.00)
Glucose, Bld: 100 mg/dL — ABNORMAL HIGH (ref 70–99)
Potassium: 3.7 mEq/L (ref 3.5–5.1)
Sodium: 141 mEq/L (ref 135–145)

## 2015-12-06 LAB — CBC
HEMATOCRIT: 34.4 % — AB (ref 39.0–52.0)
Hemoglobin: 10.9 g/dL — ABNORMAL LOW (ref 13.0–17.0)
MCHC: 31.8 g/dL (ref 30.0–36.0)
MCV: 72.2 fl — AB (ref 78.0–100.0)
Platelets: 292 10*3/uL (ref 150.0–400.0)
RBC: 4.77 Mil/uL (ref 4.22–5.81)
RDW: 19.6 % — AB (ref 11.5–15.5)
WBC: 5.7 10*3/uL (ref 4.0–10.5)

## 2015-12-06 MED ORDER — LOSARTAN POTASSIUM 25 MG PO TABS: 25.0000 mg | ORAL_TABLET | Freq: Every day | ORAL | 1 refills | Status: DC

## 2015-12-06 NOTE — Progress Notes (Signed)
HPI:  ACUTE VISIT:  Chief Complaint  Patient presents with  . high bp readings    Timothy Wood is a 75 y.o. male, who is here today complaining of elevated BP and intermittent dizziness.  He has had unstable gait and chronic tremor, he follows with neurologists, dizziness was improved after hospital discharged, 07/2015.  Hx of Hypertension, currently he is on amlodipine 5 mg, which was started on 10/22/2015.  A few months ago before hospitalization he was taking Amlodipine 5 mg daily and Irbersartan-HCTZ 150-12.5 mg daily.He was admitted May 22 and discharged on May 26: Syncope, hypotension, AKF, and right lower lobe PE.  Antihypertensive medications were discontinued due to hypotension.  Last OV his BP was mildly elevated + elevated BP readings at home, so amlodipine was resumed. He attributes dizziness to Amlodipine.  He denies any frequent/severe headache, visual changes, chest pain, dyspnea, palpitation, abdominal pain, nausea, vomiting, or focal deficit.  He has had some LE edema, which is intermittent and no more than usual. He denies any alcohol intake.  08/06/15 Echo: LVEF 65% to 70% and grade 1 diastolic dysfunction.  He states that he has had dizziness/unbalance spells intermittently for 2-3 weeks, sudden onset, worse if he moves fast but has happened while standing up cooking. He got worry after a "bad" episode of dizziness a few days ago when he saw one of the friend coming to his door, states that he contact "excited" got up  from chair and walked fast towards, he opened the door and fainted. He denies any LOC, he states that his friend was able to grab him before he fell.  Dizziness usually last a few minutes, he describes it as "swimming head" No changes in chronic tinnitus or hearing. No upper respiratory symptoms. Still smoking.  He is eating well and has noted wt gain.  He is currently on Coumadin, denies any gross hematuria, increased bruising,  gum/nose bleeding.    Lab Results  Component Value Date   WBC 4.9 10/15/2015   HGB 10.0 (L) 10/15/2015   HCT 31.4 (L) 10/15/2015   MCV 75.5 (L) 10/15/2015   PLT 314.0 10/15/2015   Lab Results  Component Value Date   CREATININE 0.94 10/15/2015   BUN 12 10/15/2015   NA 140 10/15/2015   K 3.9 10/15/2015   CL 105 10/15/2015   CO2 28 10/15/2015     Review of Systems  Constitutional: Negative for activity change, appetite change, fatigue, fever and unexpected weight change.  HENT: Positive for tinnitus. Negative for congestion, nosebleeds, postnasal drip, rhinorrhea, sinus pressure, sore throat, trouble swallowing and voice change.   Eyes: Negative for redness and visual disturbance.  Respiratory: Negative for cough, shortness of breath and wheezing.   Cardiovascular: Positive for leg swelling. Negative for chest pain and palpitations.  Gastrointestinal: Negative for abdominal pain, blood in stool, nausea and vomiting.       No changes in bowel habits  Endocrine: Negative for cold intolerance and heat intolerance.  Genitourinary: Negative for decreased urine volume, dysuria and hematuria.  Musculoskeletal: Positive for gait problem. Negative for myalgias.  Allergic/Immunologic: Negative for environmental allergies.  Neurological: Positive for dizziness. Negative for seizures, facial asymmetry, speech difficulty, weakness, numbness and headaches.  Psychiatric/Behavioral: Negative for confusion. The patient is not nervous/anxious.       Current Outpatient Prescriptions on File Prior to Visit  Medication Sig Dispense Refill  . citalopram (CELEXA) 20 MG tablet TAKE 1 TABLET (20 MG TOTAL) BY MOUTH  DAILY. 30 tablet 5  . omeprazole (PRILOSEC) 20 MG capsule TAKE 1 CAPSULE (20 MG TOTAL) BY MOUTH 2 (TWO) TIMES DAILY BEFORE A MEAL. (Patient taking differently: Take 20 mg by mouth daily. TAKE 1 CAPSULE (20 MG TOTAL) BY MOUTH 2 (TWO) TIMES DAILY BEFORE A MEAL.) 180 capsule 1  . omeprazole  (PRILOSEC) 20 MG capsule Take 20 mg by mouth 2 (two) times daily before a meal.    . thiamine (VITAMIN B-1) 100 MG tablet Take 1 tablet (100 mg total) by mouth daily. 90 tablet 2  . warfarin (COUMADIN) 5 MG tablet Take 1 tablet (5 mg total) by mouth daily. 30 tablet 3   No current facility-administered medications on file prior to visit.      Past Medical History:  Diagnosis Date  . Adenocarcinoma of lung, stage 1 ([place])   . Arthritis   . Blood clotting disorder ([place])   . Chronic obstructive pulmonary disease (COPD) ([place])    "patient not aware"  . Depression   . Diverticulosis   . DVT (deep venous thrombosis) ([place])    Post op  knee surgery  . DVT (deep venous thrombosis) ([place])   . GERD (gastroesophageal reflux disease)   . Histoplasmosis   . Hypercholesteremia   . Hyperlipidemia   . Hypertension   . Lung cancer ([place])   . Personal history of colonic polyps 2008   multiple adenomas 2008-2009 exams with large cecal adenoma  . Pneumonia    in history  . Pulmonary nodule 06/2006   9 mm right upper obe pulmonary nodule (negative Pet Scan)  . PVD (peripheral vascular disease) ([place])    left leg s/p stent  . Sebaceous cyst    neck  . Seborrheic keratosis   . Sinusitis   . Tobacco abuse   . Tremor    Allergies  Allergen Reactions  . Hydrocodone Itching  . Hydrocodone Itching    Social History   Social History  . Marital status: Married    Spouse name: N/A  . Number of children: 2  . Years of education: N/A   Occupational History  . Retired Retired   Social History Main Topics  . Smoking status: Current Every Day Smoker    Packs/day: 0.50    Years: 60.00    Types: Cigarettes  . Smokeless tobacco: Never Used  . Alcohol use 0.0 oz/week     Comment: quit drinking after last hospitalization- may 2017  . Drug use: No  . Sexual activity: Not Currently   Other Topics Concern  . None   Social History Narrative   ** Merged History Encounter **        Married for  over 50 years.  Wife died from lung cancer.   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   Occasional alcohol.    Tobacco use - over 50 pack years. Father died at age 2 of suicide. Michele Ellis-daughter emergenc   y contact    Vitals:   12/06/15 1015  BP: (!) 150/90  Pulse: 86  Resp: 12  Temp: 97.7 F (36.5 C)    O2 sat 97% at RA.  Body mass index is 23.96 kg/m.  Wt Readings from Last 3 Encounters:  12/06/15 162 lb 4 oz (73.6 kg)  10/22/15 152 lb 4 oz (69.1 kg)  10/08/15 146 lb (66.2 kg)       Physical Exam  Nursing note and vitals reviewed. Constitutional: He is oriented to person,  place, and time. He appears well-developed and well-nourished. He does not appear ill. No distress.  HENT:  Head: Atraumatic.  Mouth/Throat: Oropharynx is clear and moist and mucous membranes are normal.  Eyes: Conjunctivae and EOM are normal. Pupils are equal, round, and reactive to light.  Neck: No JVD present. No thyroid mass and no thyromegaly present.  Cardiovascular: Normal rate and regular rhythm.   No murmur heard. DP pulses present bilateral  Respiratory: Effort normal and breath sounds normal. No respiratory distress.  GI: Soft. There is no tenderness.  Musculoskeletal: He exhibits edema (1+ pedal bilateral, trace pitting edema LE bilateral). He exhibits no tenderness.  Lymphadenopathy:    He has no cervical adenopathy.  Neurological: He is alert and oriented to person, place, and time. He has normal strength. He displays tremor. No cranial nerve deficit. Coordination normal.  Slow, otherwise stable tremor, no assistance, walking his dog  Skin: Skin is warm. No erythema.  Psychiatric: He has a normal mood and affect.  Well groomed, good eye contact.      ASSESSMENT AND PLAN:     Timothy Wood was seen today for high bp readings.  Diagnoses and all orders for this visit:  Essential hypertension  Not well controlled. Possible complications of  elevated BP discussed. Stop Amlodipine and start Cozaar 25 mg daily, which is a low-dose, we will increase dose according to BP next office visit. Continue monitoring BP at home. Low salt diet. Instructed about warning signs F/U in 4 weeks.  -     Basic Metabolic Panel -     losartan (COZAAR) 25 MG tablet; Take 1 tablet (25 mg total) by mouth daily.  Dizziness and giddiness  We discussed possible causes, this is not a new problem, he was certainly better after hospital discharge. He denies any alcohol intake. Adequate hydration and fall precautions recommended for now. Further recommendations would be given according to lab results. He is not interested in PT. Encouraged to use his walker and be cautious with tripping while walking with his dog. Also move slowly. Follow-up in 4 weeks, before if needed.   -     Basic Metabolic Panel -     CBC       Return in about 4 weeks (around 01/03/2016) for HTN.     -[name] was advised to return or notify a doctor immediately if symptoms worsen or new concerns arise, he voices understanding.       Chinenye Katzenberger G. [name], MD  [facility]. [place] office.

## 2015-12-06 NOTE — Patient Instructions (Signed)
A few things to remember from today's visit:   Essential hypertension - Plan: Basic Metabolic Panel, losartan (COZAAR) 25 MG tablet  Dizziness and giddiness - Plan: Basic Metabolic Panel, CBC  Stop amlodipine Fall precautions, consider using a walker Start Cozaar once daily Monitor blood pressure at home   Please be sure medication list is accurate. If a new problem present, please set up appointment sooner than planned today.

## 2015-12-06 NOTE — Progress Notes (Signed)
Pre visit review using our clinic review tool, if applicable. No additional management support is needed unless otherwise documented below in the visit note. 

## 2015-12-18 ENCOUNTER — Ambulatory Visit (INDEPENDENT_AMBULATORY_CARE_PROVIDER_SITE_OTHER): Payer: Medicare HMO | Admitting: General Practice

## 2015-12-18 DIAGNOSIS — Z5181 Encounter for therapeutic drug level monitoring: Secondary | ICD-10-CM | POA: Diagnosis not present

## 2015-12-18 LAB — POCT INR: INR: 1.7

## 2016-01-03 ENCOUNTER — Ambulatory Visit (INDEPENDENT_AMBULATORY_CARE_PROVIDER_SITE_OTHER): Payer: Medicare HMO | Admitting: Family Medicine

## 2016-01-03 ENCOUNTER — Encounter: Payer: Self-pay | Admitting: Family Medicine

## 2016-01-03 VITALS — BP 142/98 | HR 96 | Resp 12 | Ht 69.0 in | Wt 164.5 lb

## 2016-01-03 DIAGNOSIS — I10 Essential (primary) hypertension: Secondary | ICD-10-CM | POA: Diagnosis not present

## 2016-01-03 MED ORDER — LOSARTAN POTASSIUM 50 MG PO TABS: 50.0000 mg | ORAL_TABLET | Freq: Every day | ORAL | 1 refills | Status: DC

## 2016-01-03 NOTE — Progress Notes (Signed)
Pre visit review using our clinic review tool, if applicable. No additional management support is needed unless otherwise documented below in the visit note. 

## 2016-01-03 NOTE — Progress Notes (Signed)
[name] is a 75 y.o.male, who is here today to follow on HTN.  Last seen on 12/06/15 because elevated BP and dizziness; he attributed dizziness to Amlodipine, so antihypertensive was changed.  Currently he is on Cozaar 25 mg daily.  He is taking medications as instructed, no side effects reported. Dizziness has resolved, occasionally he had some unsteady gait but he has improved, usually if he gets up fast, he usually takes hs time to get up ,wait a few seconds and then starts walking. He is not using a cane. He denies any falls since last visit.  He has not noted unusual headache, visual changes, exertional chest pain, dyspnea,  focal weakness, or edema.  Still smoking  Lab Results  Component Value Date   CREATININE 0.79 12/06/2015   BUN 10 12/06/2015   NA 141 12/06/2015   K 3.7 12/06/2015   CL 105 12/06/2015   CO2 29 12/06/2015   BP at home 160's/90's.    Review of Systems  Constitutional: Negative for activity change, appetite change, fatigue, fever and unexpected weight change.  HENT: Negative for nosebleeds, sore throat and trouble swallowing.   Eyes: Negative for pain and visual disturbance.  Respiratory: Negative for cough, shortness of breath and wheezing.   Cardiovascular: Negative for chest pain, palpitations and leg swelling.  Gastrointestinal: Negative for abdominal pain, nausea and vomiting.  Genitourinary: Negative for decreased urine volume and hematuria.  Neurological: Negative for dizziness, seizures, syncope, weakness and headaches.  Psychiatric/Behavioral: Negative for confusion. The patient is not nervous/anxious.      Current Outpatient Prescriptions on File Prior to Visit  Medication Sig Dispense Refill  . citalopram (CELEXA) 20 MG tablet TAKE 1 TABLET (20 MG TOTAL) BY MOUTH DAILY. 30 tablet 5  . omeprazole (PRILOSEC) 20 MG capsule TAKE 1 CAPSULE (20 MG TOTAL) BY MOUTH 2 (TWO) TIMES DAILY BEFORE A MEAL. (Patient taking differently:  Take 20 mg by mouth daily. TAKE 1 CAPSULE (20 MG TOTAL) BY MOUTH 2 (TWO) TIMES DAILY BEFORE A MEAL.) 180 capsule 1  . thiamine (VITAMIN B-1) 100 MG tablet Take 1 tablet (100 mg total) by mouth daily. 90 tablet 2  . warfarin (COUMADIN) 5 MG tablet Take 1 tablet (5 mg total) by mouth daily. 30 tablet 3   No current facility-administered medications on file prior to visit.      Past Medical History:  Diagnosis Date  . Adenocarcinoma of lung, stage 1 ([place])   . Arthritis   . Blood clotting disorder ([place])   . Chronic obstructive pulmonary disease (COPD) ([place])    "patient not aware"  . Depression   . Diverticulosis   . DVT (deep venous thrombosis) ([place])    Post op  knee surgery  . DVT (deep venous thrombosis) ([place])   . GERD (gastroesophageal reflux disease)   . Histoplasmosis   . Hypercholesteremia   . Hyperlipidemia   . Hypertension   . Lung cancer ([place])   . Personal history of colonic polyps 2008   multiple adenomas 2008-2009 exams with large cecal adenoma  . Pneumonia    in history  . Pulmonary nodule 06/2006   9 mm right upper obe pulmonary nodule (negative Pet Scan)  . PVD (peripheral vascular disease) ([place])    left leg s/p stent  . Sebaceous cyst    neck  . Seborrheic keratosis   . Sinusitis   . Tobacco abuse   . Tremor     Allergies  Allergen Reactions  .  Hydrocodone Itching  . Hydrocodone Itching    Social History   Social History  . Marital status: Married    Spouse name: N/A  . Number of children: 2  . Years of education: N/A   Occupational History  . Retired Retired   Social History Main Topics  . Smoking status: Current Every Day Smoker    Packs/day: 0.50    Years: 60.00    Types: Cigarettes  . Smokeless tobacco: Never Used  . Alcohol use 0.0 oz/week     Comment: quit drinking after last hospitalization- may 2017  . Drug use: No  . Sexual activity: Not Currently   Other Topics Concern  . None   Social History Narrative   ** Merged History  Encounter **        Married for over 50 years.  Wife died from lung cancer.   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   Occasional alcohol.    Tobacco use - over 50 pack years. Father died at age 42 of suicide. Timothy Wood-daughter emergenc   y contact    Vitals:   01/03/16 1010  BP: (!) 142/98  Pulse: 96  Resp: 12   O2 sat 97% at RA.  Body mass index is 24.29 kg/m.    Physical Exam  Nursing note and vitals reviewed. Constitutional: He is oriented to person, place, and time. He appears well-developed and well-nourished. No distress.  HENT:  Head: Atraumatic.  Mouth/Throat: Oropharynx is clear and moist and mucous membranes are normal.  Eyes: Conjunctivae and EOM are normal.  Cardiovascular: Normal rate and regular rhythm.   No murmur heard. DP pulses present bilateral.   Respiratory: Effort normal and breath sounds normal. No respiratory distress.  Musculoskeletal: He exhibits edema (non pitting trace pedal edema bilateral).  Neurological: He is alert and oriented to person, place, and time. He has normal strength. He displays tremor.  Not assisted gait, stable.  Skin: Skin is warm. No erythema.  Psychiatric: He has a normal mood and affect. Cognition and memory are normal.  Well groomed, good eye contact.    ASSESSMENT AND PLAN:   Timothy Wood was seen today for follow-up.  Diagnoses and all orders for this visit:  Essential hypertension -     losartan (COZAAR) 50 MG tablet; Take 1 tablet (50 mg total) by mouth daily.   BP re-checked: 155/80. Cozaar increased from 25 mg to 50 mg.  He follows with Coumadin clinic every 4 weeks, BP is checked with visits, so I will see him back in 4 months.  Continue monitoring BP at home. Instructed about warning signs.  Fall precautions discussed. Encouraged smoking cessation but he is not interested in doing so.   -[name] was advised to return sooner than planned today if new  concerns arise.     Timothy Wood G. [name], MD  [facility]. [place] office.

## 2016-01-03 NOTE — Patient Instructions (Addendum)
A few things to remember from today's visit:   Essential hypertension  Goal BP ideally < 140/90 . Fall precautions. Continue monitoring BP.   Please be sure medication list is accurate. If a new problem present, please set up appointment sooner than planned today.

## 2016-01-14 DIAGNOSIS — S32000A Wedge compression fracture of unspecified lumbar vertebra, initial encounter for closed fracture: Secondary | ICD-10-CM | POA: Diagnosis not present

## 2016-01-15 ENCOUNTER — Ambulatory Visit (INDEPENDENT_AMBULATORY_CARE_PROVIDER_SITE_OTHER): Payer: Medicare HMO | Admitting: General Practice

## 2016-01-15 DIAGNOSIS — Z5181 Encounter for therapeutic drug level monitoring: Secondary | ICD-10-CM | POA: Diagnosis not present

## 2016-01-15 LAB — POCT INR: INR: 1.5

## 2016-01-15 NOTE — Patient Instructions (Signed)
Pre visit review using our clinic review tool, if applicable. No additional management support is needed unless otherwise documented below in the visit note. 

## 2016-01-27 ENCOUNTER — Ambulatory Visit: Payer: Medicare HMO

## 2016-02-04 ENCOUNTER — Telehealth: Payer: Self-pay

## 2016-02-04 ENCOUNTER — Ambulatory Visit (INDEPENDENT_AMBULATORY_CARE_PROVIDER_SITE_OTHER): Payer: Medicare HMO | Admitting: Internal Medicine

## 2016-02-04 ENCOUNTER — Encounter: Payer: Self-pay | Admitting: Internal Medicine

## 2016-02-04 VITALS — BP 142/72 | HR 76 | Ht 67.75 in | Wt 164.2 lb

## 2016-02-04 DIAGNOSIS — R933 Abnormal findings on diagnostic imaging of other parts of digestive tract: Secondary | ICD-10-CM

## 2016-02-04 DIAGNOSIS — Z7901 Long term (current) use of anticoagulants: Secondary | ICD-10-CM | POA: Diagnosis not present

## 2016-02-04 DIAGNOSIS — Z860100 Personal history of colon polyps, unspecified: Secondary | ICD-10-CM

## 2016-02-04 DIAGNOSIS — D508 Other iron deficiency anemias: Secondary | ICD-10-CM | POA: Diagnosis not present

## 2016-02-04 NOTE — Telephone Encounter (Signed)
[facility] [address] [place] [address]  RE: Timothy Wood DOB: 1940-11-10 MRN: [id number]   Dear Dr Betty Wood,    We have scheduled the above patient for an endoscopic procedure. Our records show that he is on anticoagulation therapy.   Please advise as to how long the patient may come off his therapy of warfarin prior to the  EGD/colonoscopy procedure, which is scheduled for 03/27/16.  Please fax back/ or route the completed form to Timothy Wood, [place] at [phone].   Sincerely,    Dr Silvano Rusk

## 2016-02-04 NOTE — Progress Notes (Signed)
TATEN MERROW 75 y.o. 1940/07/23 [id number]  Assessment & Plan:   Encounter Diagnoses  Name Primary?  . Other iron deficiency anemia Yes  . History of colonic polyps   . Abnormal CT scan, stomach   . Warfarin anticoagulation    EGD and colonoscopy - to evaluate iron-deficiency,  The risks and benefits as well as alternatives of endoscopic procedure(s) have been discussed and reviewed. All questions answered. The patient agrees to proceed.  Ferrous sulfate 325 mg qd  Hold warfarin 3-5 d before procedures - increased risk of DVT/PE, vascular issues off warfarin discussed. Will also review w/ warfarin rxer  I appreciate the opportunity to care for him Gatha Mayer, MD, [facility]    Subjective:   Chief Complaint: hx colon polyps  HPI 75 yo widowed wm here w/ hx 12 adenomas in 2012 and 11 other adenomas 2008-2012 w/ 2.5 cn adenoma removed - here to schedule colonoscopy f/u exam. No bleeding diarrhea or abdominal pain. He lost his wife and son both to lung cancer since then and he has had a stage 1 lung cancer resected. In observation mode. Recent CT scan showed chronically thickened stomach (vs underdistention). He does take a PPI. Has Hgb 10 and MCV high 70's - he did not understand that he was supposed to take ferrous sulfate as advised by Dr. Julien Nordmann this summer and he has not. On warfarin due to prior DVT (1x he says) but also has PVD - no sxs from either  Allergies  Allergen Reactions  . Codeine Anaphylaxis  . Hydrocodone Itching   Outpatient Medications Prior to Visit  Medication Sig Dispense Refill  . citalopram (CELEXA) 20 MG tablet TAKE 1 TABLET (20 MG TOTAL) BY MOUTH DAILY. 30 tablet 5  . losartan (COZAAR) 50 MG tablet Take 1 tablet (50 mg total) by mouth daily. 90 tablet 1  . omeprazole (PRILOSEC) 20 MG capsule TAKE 1 CAPSULE (20 MG TOTAL) BY MOUTH 2 (TWO) TIMES DAILY BEFORE A MEAL. (Patient taking differently: Take 20 mg by mouth daily. TAKE 1 CAPSULE (20 MG  TOTAL) BY MOUTH 2 (TWO) TIMES DAILY BEFORE A MEAL.) 180 capsule 1  . thiamine (VITAMIN B-1) 100 MG tablet Take 1 tablet (100 mg total) by mouth daily. 90 tablet 2  . warfarin (COUMADIN) 5 MG tablet Take 1 tablet (5 mg total) by mouth daily. 30 tablet 3   No facility-administered medications prior to visit.    Past Medical History:  Diagnosis Date  . Adenocarcinoma of lung, stage 1 ([place])   . Arthritis   . Blood clotting disorder ([place])   . Chronic obstructive pulmonary disease (COPD) ([place])    "patient not aware"  . Colon polyps   . Depression   . Diverticulosis   . DVT (deep venous thrombosis) ([place])    Post op  knee surgery  . DVT (deep venous thrombosis) ([place])   . GERD (gastroesophageal reflux disease)   . Histoplasmosis   . Hypercholesteremia   . Hyperlipidemia   . Hypertension   . Lung cancer ([place])   . Personal history of colonic polyps 2008   multiple adenomas 2008-2009 exams with large cecal adenoma  . Pneumonia    in history  . Pulmonary nodule 06/2006   9 mm right upper obe pulmonary nodule (negative Pet Scan)  . PVD (peripheral vascular disease) ([place])    left leg s/p stent  . Sebaceous cyst    neck  . Seborrheic keratosis   . Sinusitis   .  Tobacco abuse   . Tremor    Past Surgical History:  Procedure Laterality Date  . ANGIOPLASTY / STENTING ILIAC  08/2008   Dr. Trula Slade  . BACK SURGERY  1975, 1978   x2 lumbar fusion over 30 years ago  . BACK SURGERY    . COLONOSCOPY  05/11/2006; 07/27/2006;05/05/2007, 11/24/2010   adenomas with 9 polyps index and 2.5 cm cecal polyp removed over 3 exams, last 2009 with APC. diverticulosis also.  2012: 12 adenomas removed, largest 8-10 mm, diverticulosis  . CORONARY ANGIOPLASTY WITH STENT PLACEMENT    . DESCENDING AORTIC ANEURYSM REPAIR W/ STENT  04/2010   Dr. Trula Slade  . ESOPHAGOGASTRODUODENOSCOPY  05/05/2007   GERD - one erosion  . EYE SURGERY Bilateral    cataract  . KNEE SURGERY  2002   left- cartilage   . LUNG BIOPSY    .  PERCUTANEOUS PINNING Left 06/24/2012   Procedure: Repair Complex Laceration Left Thumb with percutaneous pinning.;  Surgeon: Dennie Bible, MD;  Location: [facility];  Service: Plastics;  Laterality: Left;  . THUMB AMPUTATION Left    pinning and index finger  . TONSILLECTOMY  1956  . TONSILLECTOMY    . VIDEO ASSISTED THORACOSCOPY (VATS)/WEDGE RESECTION Right 01/24/2015   Procedure: RIGHT VIDEO ASSISTED THORACOSCOPY (VATS)/WEDGE RESECTION;  Surgeon: [name], MD;  Location: [facility];  Service: Thoracic;  Laterality: Right;   Social History   Social History  . Marital status: Married    Spouse name: N/A  . Number of children: 2  . Years of education: N/A   Occupational History  . Retired Retired   Social History Main Topics  . Smoking status: Current Every Day Smoker    Packs/day: 0.50    Years: 60.00    Types: Cigarettes  . Smokeless tobacco: Never Used  . Alcohol use 0.0 oz/week     Comment: quit drinking after last hospitalization- may 2017  . Drug use: No  . Sexual activity: Not Currently   Other Topics Concern  . None   Social History Narrative       Married for over 50 years.  Wife died from lung cancer.     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     Occasional alcohol.      Tobacco use - over 50 pack years.   Father died at age 49 of suicide.   Selinda Eon Ellis-daughter emergenc   y contact   Family History  Problem Relation Age of Onset  . Colon cancer Mother 24  . Deep vein thrombosis Mother   . Lung cancer Son     smoked and was a Building control surveyor  . Heart disease Brother   . Diabetes    . Heart disease Brother   . Rheum arthritis Daughter   . Rheum arthritis Sister      Review of Systems Chronic back pain Pedal edema Grief over loss of son and wife - dog accompanies him and that helps All other ROS negative  Objective:   Physical Exam '@BP'$  (!) 142/72   Pulse 76   Ht 5' 7.75" (1.721 m) Comment: measured without shoes  Wt  164 lb 4 oz (74.5 kg)   BMI 25.16 kg/m @  General:  Well-developed, well-nourished and in no acute distress Eyes:  anicteric. ENT:   Mouth and posterior pharynx free of lesions. + dentures Neck:   supple w/o thyromegaly or mass.  Lungs: Clear to auscultation bilaterally but decreased BS  right base - resection Heart:  S1S2, no rubs, murmurs, gallops. Abdomen:  soft, non-tender, no hepatosplenomegaly, hernia, or mass and BS+.  Rectal: deferred Lymph:  no cervical or supraclavicular adenopathy. Extremities:   no edema, cyanosis or clubbing Skin   no rash. Upper trunk = lung surgery scars Neuro:  A&O x 3.  Psych:  appropriate mood and  Affect.   Data Reviewed: Oncology notes CT scan Bone sca Labs PCP notes

## 2016-02-04 NOTE — Patient Instructions (Addendum)
  You have been scheduled for an endoscopy and colonoscopy. Please follow the written instructions given to you at your visit today. Please pick up your prep supplies at the pharmacy. If you use inhalers (even only as needed), please bring them with you on the day of your procedure.   You will be contaced by our office prior to your procedure for directions on holding your Coumadin/Warfarin.  If you do not hear from our office 1 week prior to your scheduled procedure, please call [phone] to discuss.   Take '325mg'$  of Ferrous Sulfate daily.  I appreciate the opportunity to care for you.  Silvano Rusk, MD, [facility]

## 2016-02-14 ENCOUNTER — Telehealth: Payer: Self-pay | Admitting: General Practice

## 2016-02-14 NOTE — Telephone Encounter (Signed)
-----   Message from [name] [name], MD sent at 02/13/2016 10:43 PM EST ----- Regarding: RE: Hold Coumadin It seems like his INR has been subtherapeutic the past couple times. I think he can stop it 3-4 days before procedure, INR can be re-checked a day before procedure to be sure it is < 1.4 and resume within 24 hours if no complications (around 8 hours after).  Thanks, [name]  ----- Message ----- From: Warden Fillers, RN Sent: 02/13/2016   2:45 PM To: [name] [name], MD Subject: Hold Coumadin                                  Hi Dr. [name],  Patient is having a endoscopic procedure in a few weeks and needs clearance to hold coumadin for 5 days prior.  Thanks, Villa Herb, RN

## 2016-02-14 NOTE — Telephone Encounter (Signed)
        Hi Vasilisa Vore, I am forwarding this from Dr. [name], Patient's PCP.   Previous Messages    ----- Message -----  From: [name] [name], MD  Sent: 02/13/2016 10:43 PM  To: Warden Fillers, RN  Subject: RE: Hold Coumadin                 It seems like his INR has been subtherapeutic the past couple times. I think he can stop it 3-4 days before procedure, INR can be re-checked a day before procedure to be sure it is < 1.4 and resume within 24 hours if no complications (around 8 hours after).   Thanks,  [name]   ----- Message -----  From: Warden Fillers, RN  Sent: 02/13/2016  2:45 PM  To: [name] [name], MD  Subject: Hold Coumadin                   Hi Dr. [name],   Patient is having a endoscopic procedure in a few weeks and needs clearance to hold coumadin for 5 days prior.   Thanks,  Villa Herb, RN

## 2016-02-14 NOTE — Telephone Encounter (Signed)
I am not sure what is the protocol in regard to instructions about holding Coumadin but can you please contact Timothy Wood and give above instructions. I would like INR check day before procedure to be sure it is < 1.4, so he can have colonoscopy. Thanks, BJ

## 2016-02-17 NOTE — Telephone Encounter (Signed)
Absolutely!  I will take care of it!  I do want to make sure he doesn't need a Lovenox bridge.

## 2016-02-24 NOTE — Telephone Encounter (Signed)
Spoke to Dr. [name] today - our plan is to hold warfarin 3 d before colonoscopy and no bridge and no checking of INR

## 2016-02-25 NOTE — Telephone Encounter (Signed)
Patient informed to hold warfarin 3 days prior to his procedures and he verbalized understanding.

## 2016-03-17 ENCOUNTER — Encounter: Payer: Self-pay | Admitting: Internal Medicine

## 2016-03-27 ENCOUNTER — Encounter: Payer: Self-pay | Admitting: Internal Medicine

## 2016-03-27 ENCOUNTER — Ambulatory Visit (AMBULATORY_SURGERY_CENTER): Payer: Medicare HMO | Admitting: Internal Medicine

## 2016-03-27 VITALS — BP 121/71 | HR 69 | Temp 98.7°F | Resp 17 | Ht 67.0 in | Wt 164.0 lb

## 2016-03-27 DIAGNOSIS — D508 Other iron deficiency anemias: Secondary | ICD-10-CM

## 2016-03-27 DIAGNOSIS — R933 Abnormal findings on diagnostic imaging of other parts of digestive tract: Secondary | ICD-10-CM

## 2016-03-27 DIAGNOSIS — Z86010 Personal history of colonic polyps: Secondary | ICD-10-CM | POA: Diagnosis not present

## 2016-03-27 DIAGNOSIS — D125 Benign neoplasm of sigmoid colon: Secondary | ICD-10-CM

## 2016-03-27 DIAGNOSIS — K2990 Gastroduodenitis, unspecified, without bleeding: Secondary | ICD-10-CM | POA: Diagnosis not present

## 2016-03-27 DIAGNOSIS — J449 Chronic obstructive pulmonary disease, unspecified: Secondary | ICD-10-CM | POA: Diagnosis not present

## 2016-03-27 DIAGNOSIS — D124 Benign neoplasm of descending colon: Secondary | ICD-10-CM | POA: Diagnosis not present

## 2016-03-27 DIAGNOSIS — D122 Benign neoplasm of ascending colon: Secondary | ICD-10-CM

## 2016-03-27 DIAGNOSIS — D120 Benign neoplasm of cecum: Secondary | ICD-10-CM

## 2016-03-27 DIAGNOSIS — D123 Benign neoplasm of transverse colon: Secondary | ICD-10-CM | POA: Diagnosis not present

## 2016-03-27 DIAGNOSIS — I10 Essential (primary) hypertension: Secondary | ICD-10-CM | POA: Diagnosis not present

## 2016-03-27 DIAGNOSIS — K2970 Gastritis, unspecified, without bleeding: Secondary | ICD-10-CM

## 2016-03-27 DIAGNOSIS — I2510 Atherosclerotic heart disease of native coronary artery without angina pectoris: Secondary | ICD-10-CM | POA: Diagnosis not present

## 2016-03-27 DIAGNOSIS — K3189 Other diseases of stomach and duodenum: Secondary | ICD-10-CM | POA: Diagnosis not present

## 2016-03-27 DIAGNOSIS — D509 Iron deficiency anemia, unspecified: Secondary | ICD-10-CM | POA: Diagnosis not present

## 2016-03-27 MED ORDER — SODIUM CHLORIDE 0.9 % IV SOLN: 500.0000 mL | INTRAVENOUS | Status: DC

## 2016-03-27 NOTE — Progress Notes (Signed)
To recovery, reportr to [employer], Therapist, sports, VSS

## 2016-03-27 NOTE — Progress Notes (Signed)
Called to room to assist during endoscopic procedure.  Patient ID and intended procedure confirmed with present staff. Received instructions for my participation in the procedure from the performing physician.  

## 2016-03-27 NOTE — Patient Instructions (Addendum)
The stomach looked inflamed = gastritis. Biopsies taken.  I found and removed 14 polyps today. All look benign. I will let you know pathology results and when to have another routine colonoscopy by mail.   I appreciate the opportunity to care for you. Gatha Mayer, MD, FACG  YOU HAD AN ENDOSCOPIC PROCEDURE TODAY AT [facility]:   Refer to the procedure report that was given to you for any specific questions about what was found during the examination.  If the procedure report does not answer your questions, please call your gastroenterologist to clarify.  If you requested that your care partner not be given the details of your procedure findings, then the procedure report has been included in a sealed envelope for you to review at your convenience later.  YOU SHOULD EXPECT: Some feelings of bloating in the abdomen. Passage of more gas than usual.  Walking can help get rid of the air that was put into your GI tract during the procedure and reduce the bloating. If you had a lower endoscopy (such as a colonoscopy or flexible sigmoidoscopy) you may notice spotting of blood in your stool or on the toilet paper. If you underwent a bowel prep for your procedure, you may not have a normal bowel movement for a few days.  Please Note:  You might notice some irritation and congestion in your nose or some drainage.  This is from the oxygen used during your procedure.  There is no need for concern and it should clear up in a day or so.  SYMPTOMS TO REPORT IMMEDIATELY:   Following lower endoscopy (colonoscopy or flexible sigmoidoscopy):  Excessive amounts of blood in the stool  Significant tenderness or worsening of abdominal pains  Swelling of the abdomen that is new, acute  Fever of 100F or higher   Following upper endoscopy (EGD)  Vomiting of blood or coffee ground material  New chest pain or pain under the shoulder blades  Painful or persistently difficult  swallowing  New shortness of breath  Fever of 100F or higher  Black, tarry-looking stools  For urgent or emergent issues, a gastroenterologist can be reached at any hour by calling [phone].   DIET:  We do recommend a small meal at first, but then you may proceed to your regular diet.  Drink plenty of fluids but you should avoid alcoholic beverages for 24 hours.  ACTIVITY:  You should plan to take it easy for the rest of today and you should NOT DRIVE or use heavy machinery until tomorrow (because of the sedation medicines used during the test).    FOLLOW UP: Our staff will call the number listed on your records the next business day following your procedure to check on you and address any questions or concerns that you may have regarding the information given to you following your procedure. If we do not reach you, we will leave a message.  However, if you are feeling well and you are not experiencing any problems, there is no need to return our call.  We will assume that you have returned to your regular daily activities without incident.  If any biopsies were taken you will be contacted by phone or by letter within the next 1-3 weeks.  Please call us at [phone] if you have not heard about the biopsies in 3 weeks.    SIGNATURES/CONFIDENTIALITY: You and/or your care partner have signed paperwork which will be entered into your electronic medical  record.  These signatures attest to the fact that that the information above on your After Visit Summary has been reviewed and is understood.  Full responsibility of the confidentiality of this discharge information lies with you and/or your care-partner.  Resume coumadin today.  Consider genetic evaluations DX:AJOINO

## 2016-03-27 NOTE — Op Note (Signed)
[facility] Patient Name: Timothy Wood Procedure Date: 03/27/2016 2:01 PM MRN: [id number] Endoscopist: Gatha Mayer , MD Age: 76 Referring MD:  Date of Birth: 1940/04/08 Gender: Male Account #: [id number] Procedure:                Colonoscopy Indications:              High risk colon cancer surveillance: Personal                            history of adenoma (10 mm or greater in size), High                            risk colon cancer surveillance: Personal history of                            multiple (3 or more) adenomas Medicines:                Propofol per Anesthesia, Monitored Anesthesia Care Procedure:                Pre-Anesthesia Assessment:                           - Prior to the procedure, a History and Physical                            was performed, and patient medications and                            allergies were reviewed. The patient's tolerance of                            previous anesthesia was also reviewed. The risks                            and benefits of the procedure and the sedation                            options and risks were discussed with the patient.                            All questions were answered, and informed consent                            was obtained. Prior Anticoagulants: The patient                            last took Coumadin (warfarin) 5 days prior to the                            procedure. ASA Grade Assessment: III - A patient                            with severe systemic disease. After reviewing the  risks and benefits, the patient was deemed in                            satisfactory condition to undergo the procedure.                           After obtaining informed consent, the colonoscope                            was passed under direct vision. Throughout the                            procedure, the patient's blood pressure, pulse, and                             oxygen saturations were monitored continuously. The                            Model CF-HQ190L [id number]) scope was introduced                            through the anus and advanced to the the cecum,                            identified by appendiceal orifice and ileocecal                            valve. The colonoscopy was performed without                            difficulty. The patient tolerated the procedure                            well. The quality of the bowel preparation was                            excellent. The bowel preparation used was Miralax.                            The ileocecal valve, appendiceal orifice, and                            rectum were photographed. Scope In: 2:09:53 PM Scope Out: 2:33:02 PM Scope Withdrawal Time: 0 hours 19 minutes 10 seconds  Total Procedure Duration: 0 hours 23 minutes 9 seconds  Findings:                 The perianal and digital rectal examinations were                            normal. Pertinent negatives include normal prostate                            (size, shape, and consistency).  Multiple pedunculated and sessile polyps were found                            in the entire colon. One cecal, 4 ascending, 2                            transverse, 2 descending, 5 sigmoid. The polyps                            were 2 to 15 mm in size. These polyps were removed                            with a hot snare, cold snare and cold biopsy                            forceps.. Resection and retrieval were complete.                            Verification of patient identification for the                            specimen was done. Estimated blood loss was minimal.                           The exam was otherwise without abnormality on                            direct and retroflexion views. Complications:            No immediate complications. Estimated Blood Loss:     Estimated blood loss was  minimal. Impression:               - Multiple 2 to 15 mm polyps in the entire colon,                            removed with a hot snare. Resected and retrieved.                            14 POLYPS REMOVED MAX 15 MM                           - The examination was otherwise normal on direct                            and retroflexion views. Recommendation:           - Patient has a contact number available for                            emergencies. The signs and symptoms of potential                            delayed complications were discussed with the  patient. Return to normal activities tomorrow.                            Written discharge instructions were provided to the                            patient.                           - Resume previous diet.                           - Continue present medications.                           - Resume Coumadin (warfarin) at prior dose today.                           - Repeat colonoscopy is recommended for                            surveillance. The colonoscopy date will be                            determined after pathology results from today's                            exam become available for review.                           - Reconsider genetics evaluation - 12 adenomas last                            time and others before that so seems like an                            attenuated polyposis Gatha Mayer, MD 03/27/2016 2:45:39 PM This report has been signed electronically.

## 2016-03-27 NOTE — Op Note (Signed)
[facility] Patient Name: Timothy Wood Procedure Date: 03/27/2016 2:01 PM MRN: [id number] Endoscopist: Gatha Mayer , MD Age: 76 Referring MD:  Date of Birth: 1941-03-06 Gender: Male Account #: [id number] Procedure:                Upper GI endoscopy Indications:              Iron deficiency anemia, Abnormal CT of the GI tract Medicines:                Propofol per Anesthesia, Monitored Anesthesia Care Procedure:                Pre-Anesthesia Assessment:                           - Prior to the procedure, a History and Physical                            was performed, and patient medications and                            allergies were reviewed. The patient's tolerance of                            previous anesthesia was also reviewed. The risks                            and benefits of the procedure and the sedation                            options and risks were discussed with the patient.                            All questions were answered, and informed consent                            was obtained. Prior Anticoagulants: The patient                            last took Coumadin (warfarin) 5 days prior to the                            procedure. ASA Grade Assessment: III - A patient                            with severe systemic disease. After reviewing the                            risks and benefits, the patient was deemed in                            satisfactory condition to undergo the procedure.                           After obtaining informed consent, the endoscope was  passed under direct vision. Throughout the                            procedure, the patient's blood pressure, pulse, and                            oxygen saturations were monitored continuously. The                            Model GIF-[id number]) scope was introduced                            through the mouth, and advanced to the second part                             of duodenum. The upper GI endoscopy was                            accomplished without difficulty. The patient                            tolerated the procedure well. Scope In: Scope Out: Findings:                 Diffuse mild inflammation characterized by                            congestion (edema), erythema, friability and                            granularity was found in the gastric body and in                            the gastric antrum. Biopsies were taken with a cold                            forceps for histology. Verification of patient                            identification for the specimen was done. Estimated                            blood loss was minimal.                           The exam was otherwise without abnormality.                           The cardia and gastric fundus were normal on                            retroflexion. Complications:            No immediate complications. Estimated Blood Loss:     Estimated blood loss was minimal. Impression:               -  Gastritis. Biopsied.                           - The examination was otherwise normal. Recommendation:           - Patient has a contact number available for                            emergencies. The signs and symptoms of potential                            delayed complications were discussed with the                            patient. Return to normal activities tomorrow.                            Written discharge instructions were provided to the                            patient.                           - Resume previous diet.                           - Continue present medications.                           - Resume Coumadin (warfarin) at prior dose today.                           - Await pathology results. Gatha Mayer, MD 03/27/2016 2:40:23 PM This report has been signed electronically.

## 2016-03-30 ENCOUNTER — Telehealth: Payer: Self-pay

## 2016-03-30 NOTE — Telephone Encounter (Signed)
  Follow up Call-  Call back number 03/27/2016  Post procedure Call Back phone  # [phone]  Permission to leave phone message Yes  Some recent data might be hidden     Patient questions:  Do you have a fever, pain , or abdominal swelling? No. Pain Score  0 *  Have you tolerated food without any problems? Yes.    Have you been able to return to your normal activities? Yes.    Do you have any questions about your discharge instructions: Diet   No. Medications  No. Follow up visit  No.  Do you have questions or concerns about your Care? No.  Actions: * If pain score is 4 or above: No action needed, pain <4.

## 2016-04-06 ENCOUNTER — Ambulatory Visit (HOSPITAL_COMMUNITY)
Admission: RE | Admit: 2016-04-06 | Discharge: 2016-04-06 | Disposition: A | Discharge status: Home / Self Care | Payer: Medicare HMO | Source: Ambulatory Visit | Attending: Internal Medicine | Admitting: Internal Medicine

## 2016-04-06 ENCOUNTER — Other Ambulatory Visit (HOSPITAL_BASED_OUTPATIENT_CLINIC_OR_DEPARTMENT_OTHER): Payer: Medicare HMO

## 2016-04-06 ENCOUNTER — Encounter (HOSPITAL_COMMUNITY): Payer: Self-pay

## 2016-04-06 DIAGNOSIS — C3491 Malignant neoplasm of unspecified part of right bronchus or lung: Secondary | ICD-10-CM | POA: Diagnosis present

## 2016-04-06 DIAGNOSIS — I2510 Atherosclerotic heart disease of native coronary artery without angina pectoris: Secondary | ICD-10-CM | POA: Insufficient documentation

## 2016-04-06 DIAGNOSIS — I700 Atherosclerosis of aorta: Secondary | ICD-10-CM | POA: Insufficient documentation

## 2016-04-06 LAB — COMPREHENSIVE METABOLIC PANEL
ALT: 9 U/L (ref 0–55)
ANION GAP: 9 meq/L (ref 3–11)
AST: 12 U/L (ref 5–34)
Albumin: 4.3 g/dL (ref 3.5–5.0)
Alkaline Phosphatase: 88 U/L (ref 40–150)
BUN: 10.5 mg/dL (ref 7.0–26.0)
CALCIUM: 9.9 mg/dL (ref 8.4–10.4)
CHLORIDE: 105 meq/L (ref 98–109)
CO2: 27 mEq/L (ref 22–29)
CREATININE: 1 mg/dL (ref 0.7–1.3)
EGFR: 72 mL/min/{1.73_m2} — ABNORMAL LOW (ref >90)
Glucose: 108 mg/dl (ref 70–140)
Potassium: 4.5 mEq/L (ref 3.5–5.1)
Sodium: 142 mEq/L (ref 136–145)
Total Bilirubin: 0.57 mg/dL (ref 0.20–1.20)
Total Protein: 7.6 g/dL (ref 6.4–8.3)

## 2016-04-06 LAB — CBC WITH DIFFERENTIAL/PLATELET
BASO%: 0.4 % (ref 0.0–2.0)
BASOS ABS: 0 10*3/uL (ref 0.0–0.1)
EOS ABS: 0.1 10*3/uL (ref 0.0–0.5)
EOS%: 1.7 % (ref 0.0–7.0)
HEMATOCRIT: 50.4 % — AB (ref 38.4–49.9)
HGB: 15.8 g/dL (ref 13.0–17.1)
LYMPH#: 1 10*3/uL (ref 0.9–3.3)
LYMPH%: 18.7 % (ref 14.0–49.0)
MCH: 26.6 pg — AB (ref 27.2–33.4)
MCHC: 31.3 g/dL — AB (ref 32.0–36.0)
MCV: 84.8 fL (ref 79.3–98.0)
MONO#: 0.6 10*3/uL (ref 0.1–0.9)
MONO%: 10.8 % (ref 0.0–14.0)
NEUT#: 3.6 10*3/uL (ref 1.5–6.5)
NEUT%: 68.4 % (ref 39.0–75.0)
PLATELETS: 190 10*3/uL (ref 140–400)
RBC: 5.94 10*6/uL — ABNORMAL HIGH (ref 4.20–5.82)
RDW: 19.5 % — ABNORMAL HIGH (ref 11.0–14.6)
WBC: 5.3 10*3/uL (ref 4.0–10.3)

## 2016-04-06 MED ORDER — IOPAMIDOL (ISOVUE-300) INJECTION 61%
75.0000 mL | Freq: Once | INTRAVENOUS | Status: AC | PRN
  Administered 2016-04-06: 75 mL via INTRAVENOUS

## 2016-04-07 ENCOUNTER — Encounter: Payer: Self-pay | Admitting: Internal Medicine

## 2016-04-07 NOTE — Progress Notes (Signed)
Let patient know that polyps anll benign but 10 of 14 were pre-cancerous - stomach biopsies ok I recommend recall colonoscopy 1 year Also recommend a genetics evaluation as he has more than just a history of polyps he has a polyposis syndrome which could be passed to offspring and remaining children should have colonoscopies by age 76 and genetic testing could guide future testing  He should stay on ferrous sulfate and f/u Dr. [name] - PCP re: anemia  No letter from [facility]

## 2016-04-08 ENCOUNTER — Other Ambulatory Visit: Payer: Self-pay

## 2016-04-08 DIAGNOSIS — K635 Polyp of colon: Secondary | ICD-10-CM

## 2016-04-13 ENCOUNTER — Telehealth: Payer: Self-pay | Admitting: Genetic Counselor

## 2016-04-13 NOTE — Telephone Encounter (Signed)
Pt confirmed appt for genetics

## 2016-04-14 ENCOUNTER — Telehealth: Payer: Self-pay | Admitting: Internal Medicine

## 2016-04-14 NOTE — Telephone Encounter (Signed)
Faxed records to [facility]

## 2016-04-15 ENCOUNTER — Encounter: Payer: Self-pay | Admitting: Internal Medicine

## 2016-04-15 ENCOUNTER — Telehealth: Payer: Self-pay | Admitting: Internal Medicine

## 2016-04-15 ENCOUNTER — Ambulatory Visit (HOSPITAL_BASED_OUTPATIENT_CLINIC_OR_DEPARTMENT_OTHER): Payer: Medicare HMO | Admitting: Internal Medicine

## 2016-04-15 VITALS — BP 176/86 | HR 94 | Temp 98.3°F | Resp 18 | Ht 67.0 in | Wt 166.5 lb

## 2016-04-15 DIAGNOSIS — C3491 Malignant neoplasm of unspecified part of right bronchus or lung: Secondary | ICD-10-CM | POA: Diagnosis not present

## 2016-04-15 NOTE — Progress Notes (Signed)
[facility] [phone]   Fax:([phone]  OFFICE PROGRESS NOTE  Timothy Wood, [name] [address]  DIAGNOSIS: Stage IA (T1a, N0, M0) non-small cell lung cancer, adenocarcinoma  diagnosed in April 2016.  PRIOR THERAPY: Status post right VATS with wedge resection of the right middle lobe as well as wedge resection of the right upper lobe and pleural biopsy under the care of [name] on 01/24/2015.  CURRENT THERAPY: Observation.  INTERVAL HISTORY: Timothy Wood 76 y.o. male came to the clinic today for six-month follow-up visit. The patient is feeling fine today with no specific complaints. He denied having any chest pain, shortness of breath, cough or hemoptysis. The patient denied having any weight loss or night sweats. He has no nausea, vomiting, diarrhea or constipation. He had repeat CT scan of the chest performed recently and he is here for evaluation and discussion of his scan results.  MEDICAL HISTORY: Past Medical History:  Diagnosis Date  . Adenocarcinoma of lung, stage 1 ([place])   . Arthritis   . Blood clotting disorder ([place] [facility])   . Chronic obstructive pulmonary disease (COPD) ([place])    "patient not aware"  . Colon polyps   . Depression   . Diverticulosis   . DVT (deep venous thrombosis) ([facility])    Post op  knee surgery  . DVT (deep venous thrombosis) ([place])   . GERD (gastroesophageal reflux disease)   . Histoplasmosis   . Hypercholesteremia   . Hyperlipidemia   . Hypertension   . Lung cancer ([place])   . Personal history of colonic polyps 2008   multiple adenomas 2008-2009 exams with large cecal adenoma  . Pneumonia    in history  . Polyposis coli - attenuated 09/12/2010   2008: adenomas with 9 polyps index and 2.5 cm cecal polyp removed over 3 exams, last 2009 with APC. diverticulosis also. 11/24/2010: 12 polyps removed - adenomas Anticipate routine repeat colonoscopy Spring 2013 Have recommended he see  genetics counselor re ? Polyposis 03/27/2016 14 polyps removed max 15 mm   . Pulmonary nodule 06/2006   9 mm right upper obe pulmonary nodule (negative Pet Scan)  . PVD (peripheral vascular disease) ([place] [facility])    left leg s/p stent  . Sebaceous cyst    neck  . Seborrheic keratosis   . Sinusitis   . Tobacco abuse   . Tremor     ALLERGIES:  is allergic to codeine and hydrocodone.  MEDICATIONS:  Current Outpatient Prescriptions  Medication Sig Dispense Refill  . amLODipine (NORVASC) 5 MG tablet     . citalopram (CELEXA) 20 MG tablet TAKE 1 TABLET (20 MG TOTAL) BY MOUTH DAILY. 30 tablet 5  . losartan (COZAAR) 50 MG tablet Take 1 tablet (50 mg total) by mouth daily. 90 tablet 1  . omeprazole (PRILOSEC) 20 MG capsule TAKE 1 CAPSULE (20 MG TOTAL) BY MOUTH 2 (TWO) TIMES DAILY BEFORE A MEAL. (Patient taking differently: Take 20 mg by mouth daily. TAKE 1 CAPSULE (20 MG TOTAL) BY MOUTH 2 (TWO) TIMES DAILY BEFORE A MEAL.) 180 capsule 1  . thiamine (VITAMIN B-1) 100 MG tablet Take 1 tablet (100 mg total) by mouth daily. 90 tablet 2  . warfarin (COUMADIN) 5 MG tablet Take 1 tablet (5 mg total) by mouth daily. 30 tablet 3   Current Facility-Administered Medications  Medication Dose Route Frequency Provider Last Rate Last Dose  . 0.9 %  sodium chloride infusion  500  mL Intravenous Continuous Gatha Mayer, MD        SURGICAL HISTORY:  Past Surgical History:  Procedure Laterality Date  . ANGIOPLASTY / STENTING ILIAC  08/2008   Dr. Trula Slade  . BACK SURGERY  1975, 1978   x2 lumbar fusion over 30 years ago  . BACK SURGERY    . COLONOSCOPY  05/11/2006; 07/27/2006;05/05/2007, 11/24/2010   adenomas with 9 polyps index and 2.5 cm cecal polyp removed over 3 exams, last 2009 with APC. diverticulosis also.  2012: 12 adenomas removed, largest 8-10 mm, diverticulosis  . CORONARY ANGIOPLASTY WITH STENT PLACEMENT    . DESCENDING AORTIC ANEURYSM REPAIR W/ STENT  04/2010   Dr. Trula Slade  . ESOPHAGOGASTRODUODENOSCOPY   05/05/2007   GERD - one erosion  . EYE SURGERY Bilateral    cataract  . KNEE SURGERY  2002   left- cartilage   . LUNG BIOPSY    . PERCUTANEOUS PINNING Left 06/24/2012   Procedure: Repair Complex Laceration Left Thumb with percutaneous pinning.;  Surgeon: Dennie Bible, MD;  Location: [facility];  Service: Plastics;  Laterality: Left;  . THUMB AMPUTATION Left    pinning and index finger  . TONSILLECTOMY  1956  . TONSILLECTOMY    . VIDEO ASSISTED THORACOSCOPY (VATS)/WEDGE RESECTION Right 01/24/2015   Procedure: RIGHT VIDEO ASSISTED THORACOSCOPY (VATS)/WEDGE RESECTION;  Surgeon: [name], MD;  Location: [facility];  Service: Thoracic;  Laterality: Right;  . vocal cord polyps removed      REVIEW OF SYSTEMS:  A comprehensive review of systems was negative.   PHYSICAL EXAMINATION: General appearance: alert, cooperative and no distress Head: Normocephalic, without obvious abnormality, atraumatic Neck: no adenopathy, no JVD, supple, symmetrical, trachea midline and thyroid not enlarged, symmetric, no tenderness/mass/nodules Lymph nodes: Cervical, supraclavicular, and axillary nodes normal. Resp: clear to auscultation bilaterally Back: symmetric, no curvature. ROM normal. No CVA tenderness. Cardio: regular rate and rhythm, S1, S2 normal, no murmur, click, rub or gallop GI: soft, non-tender; bowel sounds normal; no masses,  no organomegaly Extremities: extremities normal, atraumatic, no cyanosis or edema  ECOG PERFORMANCE STATUS: 0 - Asymptomatic  Blood pressure (!) 176/86, pulse 94, temperature 98.3 F (36.8 C), temperature source Oral, resp. rate 18, height '5\' 7"'$  (1.702 m), weight 166 lb 8 oz (75.5 kg), SpO2 99 %.  LABORATORY DATA: Lab Results  Component Value Date   WBC 5.3 04/06/2016   HGB 15.8 04/06/2016   HCT 50.4 (H) 04/06/2016   MCV 84.8 04/06/2016   PLT 190 04/06/2016      Chemistry      Component Value Date/Time   NA 142 04/06/2016 1129   K 4.5 04/06/2016 1129   CL  105 12/06/2015 1121   CO2 27 04/06/2016 1129   BUN 10.5 04/06/2016 1129   CREATININE 1.0 04/06/2016 1129      Component Value Date/Time   CALCIUM 9.9 04/06/2016 1129   ALKPHOS 88 04/06/2016 1129   AST 12 04/06/2016 1129   ALT 9 04/06/2016 1129   BILITOT 0.57 04/06/2016 1129       RADIOGRAPHIC STUDIES: Ct Chest W Contrast  Result Date: 04/06/2016 CLINICAL DATA:  Restaging lung cancer. EXAM: CT CHEST WITH CONTRAST TECHNIQUE: Multidetector CT imaging of the chest was performed during intravenous contrast administration. CONTRAST:  47m ISOVUE-300 IOPAMIDOL (ISOVUE-300) INJECTION 61% COMPARISON:  Chest CT 10/03/2015 FINDINGS: Chest wall: No chest wall mass, supraclavicular or axillary lymphadenopathy. Small scattered lymph nodes are stable. The thyroid gland is normal. Cardiovascular: The heart is normal  in size. No pericardial effusion. Stable atherosclerotic calcifications involving the aorta and branch vessels but no aneurysm or dissection. Stable three-vessel coronary artery calcifications. Mediastinum/Nodes: Numerous small scattered mediastinal and hilar lymph nodes are stable. 8.5 mm pretracheal lymph node on image number 65 is stable. 7.5 mm right hilar lymph node on image 70 is stable. 9 mm subcarinal lymph node on image number 80 is stable. The esophagus is grossly normal. Lungs/Pleura: Stable mild emphysematous changes and pulmonary scarring. Small scattered pulmonary nodules are unchanged. Surgical changes involving the right upper lobe and right middle lobe from prior wedge resections. No new or worrisome pulmonary lesions. No acute pulmonary findings. No pleural effusion. Upper Abdomen: Stable clustered hepatic cysts mainly in the left hepatic lobe could be a form of Carolis disease. No worrisome hepatic lesions. Gallbladder is normal. The pancreas and spleen are normal. No worrisome adrenal gland lesions. Stable right renal cyst. Stable advanced atherosclerotic calcifications involving  the upper abdominal aorta. Musculoskeletal: No significant bony findings. IMPRESSION: 1. Stable surgical changes involving the right lung. 2. Stable small scattered pulmonary nodules. No new or worrisome lesions. No acute pulmonary findings. 3. Stable emphysematous changes. 4. Stable scattered mediastinal and hilar lymph nodes but no mass or adenopathy. 5. Stable atherosclerotic calcifications involving the thoracic and abdominal aorta and coronary arteries. Electronically Signed   By: Marijo Sanes M.D.   On: 04/06/2016 14:46    ASSESSMENT AND PLAN:  This is a very pleasant 76 years old white male with stage IA non-small cell lung cancer diagnosed in April 2016 and has been observation since that time. The patient has several family member with lung cancer. His recent CT scan of the chest showed no evidence for disease recurrence. I discussed the scan results with the patient today. I recommended for him to continue on observation with repeat CT scan of the chest in 6 months for restaging of his disease. We are watching him very closely because of the strong family history of lung cancer. He was advised to call immediately if he has any concerning symptoms in the interval. The patient voices understanding of current disease status and treatment options and is in agreement with the current care plan. All questions were answered. The patient knows to call the clinic with any problems, questions or concerns. We can certainly see the patient much sooner if necessary. I spent 10 minutes counseling the patient face to face. The total time spent in the appointment was 15 minutes.  Disclaimer: This note was dictated with voice recognition software. Similar sounding words can inadvertently be transcribed and may not be corrected upon review.

## 2016-04-15 NOTE — Patient Instructions (Signed)
Steps to Quit Smoking Smoking tobacco can be bad for your health. It can also affect almost every organ in your body. Smoking puts you and people around you at risk for many serious long-lasting (chronic) diseases. Quitting smoking is hard, but it is one of the best things that you can do for your health. It is never too late to quit. What are the benefits of quitting smoking? When you quit smoking, you lower your risk for getting serious diseases and conditions. They can include:  Lung cancer or lung disease.  Heart disease.  Stroke.  Heart attack.  Not being able to have children (infertility).  Weak bones (osteoporosis) and broken bones (fractures). If you have coughing, wheezing, and shortness of breath, those symptoms may get better when you quit. You may also get sick less often. If you are pregnant, quitting smoking can help to lower your chances of having a baby of low birth weight. What can I do to help me quit smoking? Talk with your doctor about what can help you quit smoking. Some things you can do (strategies) include:  Quitting smoking totally, instead of slowly cutting back how much you smoke over a period of time.  Going to in-person counseling. You are more likely to quit if you go to many counseling sessions.  Using resources and support systems, such as:  Online chats with a counselor.  Phone quitlines.  Printed self-help materials.  Support groups or group counseling.  Text messaging programs.  Mobile phone apps or applications.  Taking medicines. Some of these medicines may have nicotine in them. If you are pregnant or breastfeeding, do not take any medicines to quit smoking unless your doctor says it is okay. Talk with your doctor about counseling or other things that can help you. Talk with your doctor about using more than one strategy at the same time, such as taking medicines while you are also going to in-person counseling. This can help make quitting  easier. What things can I do to make it easier to quit? Quitting smoking might feel very hard at first, but there is a lot that you can do to make it easier. Take these steps:  Talk to your family and friends. Ask them to support and encourage you.  Call phone quitlines, reach out to support groups, or work with a counselor.  Ask people who smoke to not smoke around you.  Avoid places that make you want (trigger) to smoke, such as:  Bars.  Parties.  Smoke-break areas at work.  Spend time with people who do not smoke.  Lower the stress in your life. Stress can make you want to smoke. Try these things to help your stress:  Getting regular exercise.  Deep-breathing exercises.  Yoga.  Meditating.  Doing a body scan. To do this, close your eyes, focus on one area of your body at a time from head to toe, and notice which parts of your body are tense. Try to relax the muscles in those areas.  Download or buy apps on your mobile phone or tablet that can help you stick to your quit plan. There are many free apps, such as QuitGuide from the CDC (Centers for Disease Control and Prevention). You can find more support from smokefree.gov and other websites. This information is not intended to replace advice given to you by your health care provider. Make sure you discuss any questions you have with your health care provider. Document Released: 12/27/2008 Document Revised: 10/29/2015 Document   Reviewed: 07/17/2014 Elsevier Interactive Patient Education  2017 [employer]  

## 2016-04-15 NOTE — Telephone Encounter (Signed)
Appointments scheduled per 04/15/16 los. Patient was given a copy of the AVS report and appointment schedule, per 04/15/16 los.

## 2016-04-16 ENCOUNTER — Ambulatory Visit (INDEPENDENT_AMBULATORY_CARE_PROVIDER_SITE_OTHER): Payer: Medicare HMO | Admitting: Family Medicine

## 2016-04-16 ENCOUNTER — Encounter: Payer: Self-pay | Admitting: Family Medicine

## 2016-04-16 VITALS — BP 140/90 | HR 74 | Resp 12 | Ht 67.0 in | Wt 167.0 lb

## 2016-04-16 DIAGNOSIS — I10 Essential (primary) hypertension: Secondary | ICD-10-CM | POA: Diagnosis not present

## 2016-04-16 DIAGNOSIS — M67431 Ganglion, right wrist: Secondary | ICD-10-CM | POA: Diagnosis not present

## 2016-04-16 DIAGNOSIS — F324 Major depressive disorder, single episode, in partial remission: Secondary | ICD-10-CM | POA: Insufficient documentation

## 2016-04-16 DIAGNOSIS — R69 Illness, unspecified: Secondary | ICD-10-CM | POA: Diagnosis not present

## 2016-04-16 DIAGNOSIS — I2699 Other pulmonary embolism without acute cor pulmonale: Secondary | ICD-10-CM

## 2016-04-16 MED ORDER — APIXABAN 5 MG PO TABS: 5.0000 mg | ORAL_TABLET | Freq: Two times a day (BID) | ORAL | 1 refills | Status: DC

## 2016-04-16 MED ORDER — LOSARTAN POTASSIUM 100 MG PO TABS: 100.0000 mg | ORAL_TABLET | Freq: Every day | ORAL | 2 refills | Status: DC

## 2016-04-16 NOTE — Progress Notes (Signed)
[name] is a 76 y.o.male, who is here today to follow on HTN.  Currently he is on Cozaar 50 mg daily and Amlodipine 5 mg daily. Sine Amlodipine was decreased from 10 mg to 5 mg, he has not had dizzy spells or falls.  He is taking medications as instructed, no side effects reported.  He has not noted unusual headache, visual changes, exertional chest pain, dyspnea,  focal weakness, or edema.    Lab Results  Component Value Date   CREATININE 1.0 04/06/2016   BUN 10.5 04/06/2016   NA 142 04/06/2016   K 4.5 04/06/2016   CL 105 12/06/2015   CO2 27 04/06/2016    BP at home 160-170/70's.  Sine his last OV he has followed with oncologists, Dr Julien Nordmann [facility] [place] non small cell lung cancer) and GI, he had colonoscopy, polypectomy performed (multiple polyps), colonoscopy recommended in a year. He is still smoking and not interested in smoking cessation.   -He has Hx of DVT and PE, he has been on Coumadin for over a year, has been subtherapeutic most of the time. Last INR was done 01/15/16, 1.5. He eats when he feels like eating and what he wants to eat.  He denies side effects from coumadin. No Hx of valvular disease or atrial fibrillation.  Depression: He is on Celexa 20 mg daily. He states that he has good and bad days (when he thinks about his son and wife), stable for years.  He denies suicidal thoughts or insomnia.  -He is also c/o nodule lesion on ulnar aspect of right wrist, noted 3-4 month ago, no changes in size and not tender. It does not interfere with wrists ROM. He would like lesion remove because it looks "ugly."   Review of Systems  Constitutional: Negative for activity change, appetite change, fatigue, fever and unexpected weight change.  HENT: Negative for mouth sores, nosebleeds, sore throat and trouble swallowing.   Eyes: Negative for redness and visual disturbance.  Respiratory: Negative for cough, shortness of breath and wheezing.     Cardiovascular: Negative for chest pain, palpitations and leg swelling.  Gastrointestinal: Negative for abdominal pain, blood in stool, nausea and vomiting.       No changes in bowel habits.  Genitourinary: Negative for decreased urine volume and hematuria.  Skin: Negative for rash.  Neurological: Negative for dizziness, syncope, weakness and headaches.  Psychiatric/Behavioral: Negative for confusion, hallucinations, sleep disturbance and suicidal ideas. The patient is not nervous/anxious.      Current Outpatient Prescriptions on File Prior to Visit  Medication Sig Dispense Refill  . amLODipine (NORVASC) 5 MG tablet     . citalopram (CELEXA) 20 MG tablet TAKE 1 TABLET (20 MG TOTAL) BY MOUTH DAILY. 30 tablet 5  . omeprazole (PRILOSEC) 20 MG capsule TAKE 1 CAPSULE (20 MG TOTAL) BY MOUTH 2 (TWO) TIMES DAILY BEFORE A MEAL. (Patient taking differently: Take 20 mg by mouth daily. TAKE 1 CAPSULE (20 MG TOTAL) BY MOUTH 2 (TWO) TIMES DAILY BEFORE A MEAL.) 180 capsule 1  . thiamine (VITAMIN B-1) 100 MG tablet Take 1 tablet (100 mg total) by mouth daily. 90 tablet 2  . warfarin (COUMADIN) 5 MG tablet Take 1 tablet (5 mg total) by mouth daily. 30 tablet 3   Current Facility-Administered Medications on File Prior to Visit  Medication Dose Route Frequency Provider Last Rate Last Dose  . 0.9 %  sodium chloride infusion  500 mL Intravenous Continuous Gatha Mayer, MD  Past Medical History:  Diagnosis Date  . Adenocarcinoma of lung, stage 1 ([place])   . Arthritis   . Blood clotting disorder ([place])   . Chronic obstructive pulmonary disease (COPD) ([place])    "patient not aware"  . Colon polyps   . Depression   . Diverticulosis   . DVT (deep venous thrombosis) ([place])    Post op  knee surgery  . DVT (deep venous thrombosis) ([place])   . GERD (gastroesophageal reflux disease)   . Histoplasmosis   . Hypercholesteremia   . Hyperlipidemia   . Hypertension   . Lung cancer ([place])   . Personal history  of colonic polyps 2008   multiple adenomas 2008-2009 exams with large cecal adenoma  . Pneumonia    in history  . Polyposis coli - attenuated 09/12/2010   2008: adenomas with 9 polyps index and 2.5 cm cecal polyp removed over 3 exams, last 2009 with APC. diverticulosis also. 11/24/2010: 12 polyps removed - adenomas Anticipate routine repeat colonoscopy Spring 2013 Have recommended he see genetics counselor re ? Polyposis 03/27/2016 14 polyps removed max 15 mm   . Pulmonary nodule 06/2006   9 mm right upper obe pulmonary nodule (negative Pet Scan)  . PVD (peripheral vascular disease) ([place])    left leg s/p stent  . Sebaceous cyst    neck  . Seborrheic keratosis   . Sinusitis   . Tobacco abuse   . Tremor     Allergies  Allergen Reactions  . Codeine Anaphylaxis  . Hydrocodone Itching    Social History   Social History  . Marital status: Married    Spouse name: N/A  . Number of children: 2  . Years of education: N/A   Occupational History  . Retired Retired   Social History Main Topics  . Smoking status: Current Every Day Smoker    Packs/day: 0.50    Years: 60.00    Types: Cigarettes  . Smokeless tobacco: Never Used  . Alcohol use 0.0 oz/week     Comment: quit drinking after last hospitalization- may 2017  . Drug use: No  . Sexual activity: Not Currently   Other Topics Concern  . None   Social History Narrative       Married for over 50 years.  Wife died from lung cancer.     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     Occasional alcohol.      Tobacco use - over 50 pack years.   Father died at age 66 of suicide.   Michele Ellis-daughter emergenc   y contact    Vitals:   04/16/16 0950  BP: 140/90  Pulse: 74  Resp: 12  O2 sat 95% at RA. Body mass index is 26.16 kg/m.    Physical Exam  Nursing note and vitals reviewed. Constitutional: He is oriented to person, place, and time. He appears well-developed and well-nourished. No  distress.  HENT:  Head: Atraumatic.  Mouth/Throat: Oropharynx is clear and moist and mucous membranes are normal.  Eyes: Conjunctivae and EOM are normal.  Cardiovascular: Normal rate and regular rhythm.   No murmur heard. DP pulses present bilateral.   Respiratory: Effort normal and breath sounds normal. No respiratory distress.  GI: Soft. He exhibits no mass. There is no hepatomegaly. There is no tenderness.  Musculoskeletal: He exhibits no edema or tenderness.       Right wrist: He exhibits normal range of motion, no tenderness and no bony  tenderness.  Right wrist ulnar aspect with soft nodular lesion, 2 cm, no tender, no erythematous.  Neurological: He is alert and oriented to person, place, and time. He has normal strength.  Stable gait with no assistance.  Skin: Skin is warm. No erythema.  Psychiatric: He has a normal mood and affect. Cognition and memory are normal.  Well groomed, good eye contact.     ASSESSMENT AND PLAN:   Itay was seen today for follow-up.  Diagnoses and all orders for this visit:    Essential hypertension  Not well controlled. Possible complications of elevated BP discussed. Cozaar increased from 50 mg to 100 mg, Amlodipine unchanged. Monitor BP at home.  -     losartan (COZAAR) 100 MG tablet; Take 1 tablet (100 mg total) by mouth daily.  Other pulmonary embolism without acute cor pulmonale, unspecified chronicity (HCC)  Recurrent episodes of DVT and PE event on chronic anticoagulation.  Poor compliance with diet, INR has been subtherapeutic most of the time. I do not see any contraindication for changing Coumadin to a factor Xa inhibitor. We do not have Coumadin clinic today and no availability of doing a INR in house. He will stop Coumadin and in 2-3 days start Eliquis. We discussed some side effects. He will let me know about insurance coverage.  -     apixaban (ELIQUIS) 5 MG TABS tablet; Take 1 tablet (5 mg total) by mouth 2 (two) times  daily.  Ganglion of right wrist  He would like lesion remove, explained it can re-occur.  -     Ambulatory referral to Orthopedic Surgery  Major depressive disorder with single episode, in partial remission ([place])  Stable. Still mild episodes of depression,grieving the lost of his son and wife. No suicidal. No changes in current management.    I will see him back in 3 months, before if needed.   -[name] advised to return sooner than planned today if new concerns arise.     [name]. [name], MD  [facility]. [place] office.

## 2016-04-16 NOTE — Progress Notes (Signed)
Pre visit review using our clinic review tool, if applicable. No additional management support is needed unless otherwise documented below in the visit note. 

## 2016-04-16 NOTE — Patient Instructions (Addendum)
A few things to remember from today's visit:   Essential hypertension - Plan: losartan (COZAAR) 100 MG tablet  Other pulmonary embolism without acute cor pulmonale, unspecified chronicity (HCC) - Plan: apixaban (ELIQUIS) 5 MG TABS tablet  Ganglion of right wrist - Plan: Ambulatory referral to Orthopedic Surgery   Today we changed Coumadin for Eliquis, which you can start in 2 days.  Increase Cozaar from 50 mg to 100 mg.   Please be sure medication list is accurate. If a new problem present, please set up appointment sooner than planned today.

## 2016-04-17 ENCOUNTER — Encounter: Payer: Self-pay | Admitting: Genetic Counselor

## 2016-04-21 ENCOUNTER — Other Ambulatory Visit: Payer: Medicare HMO

## 2016-04-21 ENCOUNTER — Ambulatory Visit (HOSPITAL_BASED_OUTPATIENT_CLINIC_OR_DEPARTMENT_OTHER): Payer: Medicare HMO

## 2016-04-21 DIAGNOSIS — Z86010 Personal history of colonic polyps: Secondary | ICD-10-CM | POA: Diagnosis not present

## 2016-04-21 DIAGNOSIS — D126 Benign neoplasm of colon, unspecified: Secondary | ICD-10-CM | POA: Diagnosis not present

## 2016-04-21 DIAGNOSIS — Z800 Family history of malignant neoplasm of digestive organs: Secondary | ICD-10-CM

## 2016-04-21 DIAGNOSIS — Z8049 Family history of malignant neoplasm of other genital organs: Secondary | ICD-10-CM | POA: Diagnosis not present

## 2016-04-21 DIAGNOSIS — K635 Polyp of colon: Secondary | ICD-10-CM | POA: Diagnosis not present

## 2016-04-21 NOTE — Progress Notes (Signed)
REFERRING PROVIDER: [name] [name], MD [place], [place] [address]  PRIMARY PROVIDER:  [name], MD  PRIMARY REASON FOR VISIT:  1. History of colonic polyps   2. Family history of colon cancer in mother   34. Family history of uterine cancer      HISTORY OF PRESENT ILLNESS:   Timothy Wood, a 76 y.o. male, was seen for a [facility] cancer genetics consultation at the request of Dr. [name] due to a personal and family history of cancer.  Timothy Wood presents to clinic today to discuss the possibility of a hereditary predisposition to cancer, genetic testing, and to further clarify his future cancer risks, as well as potential cancer risks for family members.   In 2008, at the age of 51, [name] was diagnosed with multiple adenomatous and hyperplastic colon polyps, 9 total. 3 more polyps were identified by colonoscopy in 2009, 12 adenomas were diagnosed in 2012 and 14 were found on the most recent scope in January 2018.  CANCER HISTORY:  Oncology History   Patient followed with CT Chest scans due to right lung opacity.    Adenocarcinoma of right lung [facility])   Staging form: Lung, AJCC 7th Edition     Clinical stage from 02/28/2015: Stage IA (T1a, N0, M0) - Signed by Curt Bears, MD on 02/28/2015       Adenocarcinoma of right lung [facility])   07/04/2014 Imaging    CT Chest IMPRESSION: 1. Part solid nodule within the right middle lobe has increased in size from previous exam. This currently measures 1.7 cm, previously 1.4 cm. Adenocarcinoma with developing invasive component is suspected.       07/11/2014 Imaging    PET IMPRESSION: 1. No hypermetabolism to correspond to the right middle lobe sub solid pulmonary nodule. As low-grade adenocarcinoma is typically non FDG avid, this lesion remains indeterminate      12/28/2014 Imaging    CT Chest IMPRESSION: 1. There has been slow but convincing growth of the ground-glass nodule in the right middle lobe over the  past 6 years.      01/24/2015 Initial Diagnosis    Adenocarcinoma of right lung ([place])      01/24/2015 Surgery    PROCEDURE:  Right video-assisted thoracoscopy Wedge resection, right middle lobe Wedge resection, right upper lobe Pleural biopsy On-Q local anesthetic catheter placement      01/24/2015 Pathology Results    1. Lung, wedge biopsy/resection, right wedge resection, right middle lobe - INVASIVE WELL-DIFFERENTIATED MINIMALLY INVASIVE ADENOCARCINOMA. - TUMOR SPANS 1.1 CM IN GREATEST DIMENSION. - TUMOR APPROACHES BUT DOES NOT INVOLVE THE PLEURA.       Past Medical History:  Diagnosis Date  . Adenocarcinoma of lung, stage 1 ([place])   . Arthritis   . Blood clotting disorder ([place])   . Chronic obstructive pulmonary disease (COPD) ([place])    "patient not aware"  . Colon polyps   . Depression   . Diverticulosis   . DVT (deep venous thrombosis) ([place])    Post op  knee surgery  . DVT (deep venous thrombosis) ([place])   . GERD (gastroesophageal reflux disease)   . Histoplasmosis   . Hypercholesteremia   . Hyperlipidemia   . Hypertension   . Lung cancer ([place])   . Personal history of colonic polyps 2008   multiple adenomas 2008-2009 exams with large cecal adenoma  . Pneumonia    in history  . Polyposis coli - attenuated 09/12/2010   2008: adenomas with 9  polyps index and 2.5 cm cecal polyp removed over 3 exams, last 2009 with APC. diverticulosis also. 11/24/2010: 12 polyps removed - adenomas Anticipate routine repeat colonoscopy Spring 2013 Have recommended he see genetics counselor re ? Polyposis 03/27/2016 14 polyps removed max 15 mm   . Pulmonary nodule 06/2006   9 mm right upper obe pulmonary nodule (negative Pet Scan)  . PVD (peripheral vascular disease) ([place])    left leg s/p stent  . Sebaceous cyst    neck  . Seborrheic keratosis   . Sinusitis   . Tobacco abuse   . Tremor     Past Surgical History:  Procedure Laterality Date  . ANGIOPLASTY / STENTING ILIAC  08/2008   Dr.  Trula Slade  . BACK SURGERY  1975, 1978   x2 lumbar fusion over 30 years ago  . BACK SURGERY    . COLONOSCOPY  05/11/2006; 07/27/2006;05/05/2007, 11/24/2010   adenomas with 9 polyps index and 2.5 cm cecal polyp removed over 3 exams, last 2009 with APC. diverticulosis also.  2012: 12 adenomas removed, largest 8-10 mm, diverticulosis  . CORONARY ANGIOPLASTY WITH STENT PLACEMENT    . DESCENDING AORTIC ANEURYSM REPAIR W/ STENT  04/2010   Dr. Trula Slade  . ESOPHAGOGASTRODUODENOSCOPY  05/05/2007   GERD - one erosion  . EYE SURGERY Bilateral    cataract  . KNEE SURGERY  2002   left- cartilage   . LUNG BIOPSY    . PERCUTANEOUS PINNING Left 06/24/2012   Procedure: Repair Complex Laceration Left Thumb with percutaneous pinning.;  Surgeon: Dennie Bible, MD;  Location: [facility];  Service: Plastics;  Laterality: Left;  . THUMB AMPUTATION Left    pinning and index finger  . TONSILLECTOMY  1956  . TONSILLECTOMY    . VIDEO ASSISTED THORACOSCOPY (VATS)/WEDGE RESECTION Right 01/24/2015   Procedure: RIGHT VIDEO ASSISTED THORACOSCOPY (VATS)/WEDGE RESECTION;  Surgeon: [name], MD;  Location: [facility];  Service: Thoracic;  Laterality: Right;  . vocal cord polyps removed      Social History   Social History  . Marital status: Married    Spouse name: N/A  . Number of children: 2  . Years of education: N/A   Occupational History  . Retired Retired   Social History Main Topics  . Smoking status: Current Every Day Smoker    Packs/day: 0.50    Years: 60.00    Types: Cigarettes  . Smokeless tobacco: Never Used  . Alcohol use 0.0 oz/week     Comment: quit drinking after last hospitalization- may 2017  . Drug use: No  . Sexual activity: Not Currently   Other Topics Concern  . Not on file   Social History Narrative       Married for over 50 years.  Wife died from lung cancer.     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     Occasional alcohol.      Tobacco use - over 50  pack years.   Father died at age 4 of suicide.   Michele Leisure centre manager   y contact     FAMILY HISTORY:  We obtained a detailed, 4-generation family history.  Significant diagnoses are listed below: Family History  Problem Relation Age of Onset  . Colon cancer Mother 68    multiple colon polyps  . Deep vein thrombosis Mother   . Cancer Mother 36    uterine cancer  . Lung cancer Son     smoked and  was a Building control surveyor  . Heart disease Brother   . Diabetes    . Heart disease Brother   . Rheum arthritis Daughter   . Rheum arthritis Sister     Mr. Makin is unaware of previous family history of genetic testing for hereditary cancer risks. Patient's maternal ancestors are of [place] descent, and paternal ancestors are of [place] descent. There is no reported Ashkenazi Jewish ancestry.  GENETIC COUNSELING ASSESSMENT: KONGMENG SANTORO is a 76 y.o. male with a personal history of multiple colon polyps which is somewhat suggestive of a hereditary polyposis syndrome and predisposition to cancer. We, therefore, discussed and recommended the following at today's visit.   DISCUSSION: We reviewed the characteristics, features and inheritance patterns of hereditary cancer syndromes. We also discussed genetic testing, including the appropriate family members to test, the process of testing, insurance coverage and turn-around-time for results. We discussed the implications of a negative, positive and/or variant of uncertain significant result. We recommended Mr. Slomski pursue genetic testing for the Common Hereditary Cancers gene panel.   Based on Mr. Dome personal and family history of colon polyps and cancer, he meets medical criteria for genetic testing. Despite that he meets criteria, he may still have an out of pocket cost. We discussed that if his out of pocket cost for testing is over $100, the laboratory will call and confirm whether he wants to proceed with testing.  If the out of pocket  cost of testing is less than $100 he will be billed by the genetic testing laboratory.   PLAN: After considering the risks, benefits, and limitations, Mr. Rogus  provided informed consent to pursue genetic testing and the blood sample was sent to [facility] for analysis of the Common Hereditary Cancers Panel test. Results should be available within approximately 2-3 weeks' time, at which point they will be disclosed by telephone to Mr. Oldaker, as will any additional recommendations warranted by these results. Mr. Kron will receive a summary of his genetic counseling visit and a copy of his results once available. This information will also be available in Epic. We encouraged Mr. Seeley to remain in contact with cancer genetics annually so that we can continuously update the family history and inform him of any changes in cancer genetics and testing that may be of benefit for his family. Mr. Crickenberger questions were answered to his satisfaction today. Our contact information was provided should additional questions or concerns arise.  Mr.  Kops questions were answered to his satisfaction today. Our contact information was provided should additional questions or concerns arise. Thank you for the referral and allowing [place] to share in the care of your patient.   Benay Pike, MS, [facility] Certified Genetic Counselor  The patient was seen for a total of 35 minutes in face-to-face genetic counseling.  This patient was discussed with Drs. Magrinat, Lindi Adie and/or Burr Medico who agrees with the above.    _______________________________________________________________________ For Office Staff:  Number of people involved in session: 2 Was an Intern/ student involved with case: no

## 2016-04-27 ENCOUNTER — Ambulatory Visit (INDEPENDENT_AMBULATORY_CARE_PROVIDER_SITE_OTHER): Payer: Self-pay | Admitting: Orthopaedic Surgery

## 2016-04-27 ENCOUNTER — Other Ambulatory Visit: Payer: Self-pay | Admitting: General Practice

## 2016-04-27 ENCOUNTER — Ambulatory Visit: Payer: Self-pay | Admitting: General Practice

## 2016-04-28 ENCOUNTER — Ambulatory Visit (INDEPENDENT_AMBULATORY_CARE_PROVIDER_SITE_OTHER): Payer: Medicare HMO | Admitting: Orthopaedic Surgery

## 2016-04-28 ENCOUNTER — Encounter (INDEPENDENT_AMBULATORY_CARE_PROVIDER_SITE_OTHER): Payer: Self-pay | Admitting: Orthopaedic Surgery

## 2016-04-28 DIAGNOSIS — M67431 Ganglion, right wrist: Secondary | ICD-10-CM | POA: Diagnosis not present

## 2016-04-28 NOTE — Progress Notes (Signed)
Office Visit Note   Patient: Timothy Wood           Date of Birth: [birth year]           MRN: [id number] Visit Date: 04/28/2016              Requested by: [name] [name], MD [address] [place], [place] [address] PCP: [name], MD   Assessment & Plan: Visit Diagnoses:  1. Ganglion of right wrist     Plan: Right wrist ganglion cyst. Given strong personal history of cancer would recommend MRI for full evaluation to rule out malignancy. We'll have him follow-up to review the MRI and discussion of treatment options.  Follow-Up Instructions: Return in about 10 days (around 05/08/2016).   Orders:  Orders Placed This Encounter  Procedures  . MR Wrist Right w/o contrast  . MR Wrist Right w/ contrast   No orders of the defined types were placed in this encounter.     Procedures: No procedures performed   Clinical Data: No additional findings.   Subjective: Chief Complaint  Patient presents with  . Right Wrist - Cyst    Patient 76 year old gentleman comes in today for right wrist mass that is slowly enlarging for the last 5-6 months. He denies any pain. This is bothering him cosmetically. He does have a strong personal cancer history. He denies any constitutional symptoms. Denies any fevers or chills. The pain does not radiate.     Review of Systems    Objective: Vital Signs: There were no vitals taken for this visit.  Physical Exam  Constitutional: He is oriented to person, place, and time. He appears well-developed and well-nourished.  HENT:  Head: Normocephalic and atraumatic.  Eyes: Pupils are equal, round, and reactive to light.  Neck: Neck supple.  Pulmonary/Chest: Effort normal.  Abdominal: Soft.  Musculoskeletal: Normal range of motion.  Neurological: He is alert and oriented to person, place, and time.  Skin: Skin is warm.  Psychiatric: He has a normal mood and affect. His behavior is normal. Judgment and thought content normal.    Nursing note and vitals reviewed.   Ortho Exam Stable right wrist shows a firm nonmobile mass overlying the dorsal aspect of the TCU tendon. This does transilluminate with light. There are no skin changes or signs of infection or ulceration. The hand is neurovascularly intact. He has full use of his wrist and hand and fingers.  Specialty Comments:  No specialty comments available.  Imaging: No results found.   PMFS History: Patient Active Problem List   Diagnosis Date Noted  . Ganglion of right wrist 04/28/2016  . Depression, major, in partial remission ([place]) 04/16/2016  . Encounter for therapeutic drug monitoring 08/16/2015  . Arterial hypotension   . Alcohol use   . Hand laceration   . Lumbar compression fracture ([place])   . Hypertension 08/05/2015  . Hypotension arterial 08/05/2015  . Pulmonary embolism ([place]) 08/05/2015  . Lung cancer ([place]) 08/05/2015  . Lactic acidosis 08/05/2015  . Syncope 08/05/2015  . CAD (coronary artery disease) 08/05/2015  . Alcohol intoxication ([place]) 08/05/2015  . Kidney disease 08/05/2015  . Laceration of head 08/05/2015  . Laceration of right hand 08/05/2015  . Normocytic anemia 08/05/2015  . Bone lesion 08/05/2015  . Prostate hypertrophy 08/05/2015  . Pulmonary embolus ([place]) 08/05/2015  . Complicated grieving 15/17/6160  . Histoplasmosis with pneumonia ([place]) 04/01/2015  . Malnutrition of moderate degree 01/26/2015  . Adenocarcinoma of right lung ([place]) 01/24/2015  .  Coronary atherosclerosis due to calcified coronary lesion of native artery 01/14/2015  . Tremor 09/19/2014  . Infected sebaceous cyst of back 07/21/2013  . Carbuncle and furuncle of trunk 07/10/2013  . COPD GOLD I 03/03/2013  . Grieving 02/06/2013  . Abnormal prostate on physical examination 08/01/2012  . Polyposis coli - attenuated 09/12/2010  . Peripheral vascular disease ([place]) 06/11/2008  . Hyperlipidemia 03/02/2008  . SEBORRHEIC KERATOSIS 03/02/2008  . BACK PAIN,  LUMBAR, CHRONIC 09/30/2007  . TOBACCO ABUSE 08/19/2007  . Essential hypertension 08/19/2007  . Multiple pulmonary nodules 08/19/2007   Past Medical History:  Diagnosis Date  . Adenocarcinoma of lung, stage 1 ([place])   . Arthritis   . Blood clotting disorder ([place])   . Chronic obstructive pulmonary disease (COPD) ([place])    "patient not aware"  . Colon polyps   . Depression   . Diverticulosis   . DVT (deep venous thrombosis) ([place])    Post op  knee surgery  . DVT (deep venous thrombosis) ([place])   . GERD (gastroesophageal reflux disease)   . Histoplasmosis   . Hypercholesteremia   . Hyperlipidemia   . Hypertension   . Lung cancer ([place])   . Personal history of colonic polyps 2008   multiple adenomas 2008-2009 exams with large cecal adenoma  . Pneumonia    in history  . Polyposis coli - attenuated 09/12/2010   2008: adenomas with 9 polyps index and 2.5 cm cecal polyp removed over 3 exams, last 2009 with APC. diverticulosis also. 11/24/2010: 12 polyps removed - adenomas Anticipate routine repeat colonoscopy Spring 2013 Have recommended he see genetics counselor re ? Polyposis 03/27/2016 14 polyps removed max 15 mm   . Pulmonary nodule 06/2006   9 mm right upper obe pulmonary nodule (negative Pet Scan)  . PVD (peripheral vascular disease) ([place])    left leg s/p stent  . Sebaceous cyst    neck  . Seborrheic keratosis   . Sinusitis   . Tobacco abuse   . Tremor     Family History  Problem Relation Age of Onset  . Colon cancer Mother 41    multiple colon polyps  . Deep vein thrombosis Mother   . Cancer Mother 31    uterine cancer  . Lung cancer Son     smoked and was a Building control surveyor  . Heart disease Brother   . Diabetes    . Heart disease Brother   . Rheum arthritis Daughter   . Rheum arthritis Sister     Past Surgical History:  Procedure Laterality Date  . ANGIOPLASTY / STENTING ILIAC  08/2008   Dr. Trula Slade  . BACK SURGERY  1975, 1978   x2 lumbar fusion over 30 years ago  . BACK  SURGERY    . COLONOSCOPY  05/11/2006; 07/27/2006;05/05/2007, 11/24/2010   adenomas with 9 polyps index and 2.5 cm cecal polyp removed over 3 exams, last 2009 with APC. diverticulosis also.  2012: 12 adenomas removed, largest 8-10 mm, diverticulosis  . CORONARY ANGIOPLASTY WITH STENT PLACEMENT    . DESCENDING AORTIC ANEURYSM REPAIR W/ STENT  04/2010   Dr. Trula Slade  . ESOPHAGOGASTRODUODENOSCOPY  05/05/2007   GERD - one erosion  . EYE SURGERY Bilateral    cataract  . KNEE SURGERY  2002   left- cartilage   . LUNG BIOPSY    . PERCUTANEOUS PINNING Left 06/24/2012   Procedure: Repair Complex Laceration Left Thumb with percutaneous pinning.;  Surgeon: Dennie Bible, MD;  Location: [facility];  Service:  Plastics;  Laterality: Left;  . THUMB AMPUTATION Left    pinning and index finger  . TONSILLECTOMY  1956  . TONSILLECTOMY    . VIDEO ASSISTED THORACOSCOPY (VATS)/WEDGE RESECTION Right 01/24/2015   Procedure: RIGHT VIDEO ASSISTED THORACOSCOPY (VATS)/WEDGE RESECTION;  Surgeon: [name], MD;  Location: [facility];  Service: Thoracic;  Laterality: Right;  . vocal cord polyps removed     Social History   Occupational History  . Retired Retired   Social History Main Topics  . Smoking status: Current Every Day Smoker    Packs/day: 0.50    Years: 60.00    Types: Cigarettes  . Smokeless tobacco: Never Used  . Alcohol use 0.0 oz/week     Comment: quit drinking after last hospitalization- may 2017  . Drug use: No  . Sexual activity: Not Currently

## 2016-04-29 ENCOUNTER — Ambulatory Visit: Payer: Self-pay | Admitting: Genetic Counselor

## 2016-04-29 ENCOUNTER — Other Ambulatory Visit (INDEPENDENT_AMBULATORY_CARE_PROVIDER_SITE_OTHER): Payer: Self-pay | Admitting: Orthopaedic Surgery

## 2016-04-29 DIAGNOSIS — T1590XA Foreign body on external eye, part unspecified, unspecified eye, initial encounter: Secondary | ICD-10-CM

## 2016-04-29 DIAGNOSIS — Z1379 Encounter for other screening for genetic and chromosomal anomalies: Secondary | ICD-10-CM | POA: Insufficient documentation

## 2016-04-29 NOTE — Progress Notes (Signed)
[facility]    Patient Name: ODIS TURCK Patient DOB: March 22, 1940 Patient Age: 76 y.o. Encounter Date: 04/29/2016  Referring Provider: Silvano Rusk, MD  Primary Care Provider: [name], MD  Mr. Bauernfeind was called today to discuss genetic test results. Please see the Genetics note from his visit on 04/21/16 for a detailed discussion of his personal and family history.  Genetic Testing: At the time of Mr. Welter visit, we recommended he pursue genetic testing of multiple associated with a hereditary predisposition to cancer. Testing included sequencing and deletion/duplication analysis of 43 genes on [facility]'s Common Cancers panel (APC, ATM, AXIN2, BARD1, BMPR1A, BRCA1, BRCA2, BRIP1, CDH1, CDKN2A, CHEK2, DICER1, EPCAM, GREM1, HOXB13, KIT, MEN1, MLH1, MSH2, MSH6, MUTYH, NBN, NF1, PALB2, PDGFRA, PMS2, POLD1, POLE, PTEN, RAD50, RAD51C, RAD51D, SDHA, SDHB, SDHC, SDHD, SMAD4, SMARCA4, STK11, TP53, TSC1, TSC2, VHL). Testing was normal and did not reveal a mutation in these genes. A copy of the genetic test report will be scanned into Epic under the media tab.  Since the current test is not perfect, it is possible there may be a gene mutation that current testing cannot detect, but that chance is small. We also discussed that it is possible that a different genetic factor, which was not part of this testing or has not yet been discovered, is responsible for the cancer diagnoses in the family. Again, the likelihood of this is low. No additional testing is recommended at this time.   Cancer Screening: Given the personal and family histories, we must interpret these negative results with some caution. Families with features suggestive of hereditary risk tend to have multiple family members with cancer, diagnoses in multiple generations and diagnoses before the age of 77. Mr. Nuttall family exhibits some of these features. This result may simply  reflect our current inability to detect all mutations within these genes. It is also possible there is a different gene, that we have not yet tested, that may be responsible for the cancer in this family. Mr. Korinek should continued to be followed closely by his gastroenterologist based on his personal history of multiple colon polyps.  Family Members: Given the history of multiple colon polyps in the family, individuals in the family should have regular colonoscopies to screen for colon cancer. In particular, Mr. Kley daughter should have a colonoscopy this year and should repeat them every 5 to 10 years.  Lastly, cancer genetics is a rapidly advancing field and it is possible that new genetic tests will be appropriate for him in the future. We encourage him to remain in contact with [place] on an annual basis so we can update his personal and family histories, and let him know of advances in cancer genetics that may benefit the family. Our contact number was provided. Mr. Mahl is welcome to call anytime with additional questions.    Marylouise Stacks, MS, [facility] Certified Genetic Counselor phone: [phone] [email or web address]

## 2016-04-29 NOTE — Addendum Note (Signed)
Addended by: Daylene Posey T on: 04/29/2016 02:59 PM   Modules accepted: Orders

## 2016-05-07 ENCOUNTER — Ambulatory Visit (INDEPENDENT_AMBULATORY_CARE_PROVIDER_SITE_OTHER): Payer: Medicare HMO | Admitting: Orthopaedic Surgery

## 2016-05-07 ENCOUNTER — Ambulatory Visit
Admission: RE | Admit: 2016-05-07 | Discharge: 2016-05-07 | Disposition: A | Discharge status: Home / Self Care | Payer: Medicare HMO | Source: Ambulatory Visit | Attending: Orthopaedic Surgery | Admitting: Orthopaedic Surgery

## 2016-05-07 DIAGNOSIS — Z01818 Encounter for other preprocedural examination: Secondary | ICD-10-CM | POA: Diagnosis not present

## 2016-05-07 DIAGNOSIS — T1590XA Foreign body on external eye, part unspecified, unspecified eye, initial encounter: Secondary | ICD-10-CM

## 2016-05-09 ENCOUNTER — Ambulatory Visit
Admission: RE | Admit: 2016-05-09 | Discharge: 2016-05-09 | Disposition: A | Discharge status: Home / Self Care | Payer: Medicare HMO | Source: Ambulatory Visit | Attending: Orthopaedic Surgery | Admitting: Orthopaedic Surgery

## 2016-05-09 DIAGNOSIS — M799 Soft tissue disorder, unspecified: Secondary | ICD-10-CM | POA: Diagnosis not present

## 2016-05-09 DIAGNOSIS — M67431 Ganglion, right wrist: Secondary | ICD-10-CM

## 2016-05-09 MED ORDER — GADOBENATE DIMEGLUMINE 529 MG/ML IV SOLN
15.0000 mL | Freq: Once | INTRAVENOUS | Status: AC | PRN
  Administered 2016-05-09: 15 mL via INTRAVENOUS

## 2016-05-09 NOTE — Progress Notes (Signed)
Needs f/u appt to review MRI

## 2016-05-11 ENCOUNTER — Ambulatory Visit (INDEPENDENT_AMBULATORY_CARE_PROVIDER_SITE_OTHER): Payer: Medicare HMO | Admitting: Orthopaedic Surgery

## 2016-05-12 ENCOUNTER — Ambulatory Visit (INDEPENDENT_AMBULATORY_CARE_PROVIDER_SITE_OTHER): Payer: Medicare HMO | Admitting: Orthopaedic Surgery

## 2016-05-12 DIAGNOSIS — M67431 Ganglion, right wrist: Secondary | ICD-10-CM | POA: Diagnosis not present

## 2016-05-12 NOTE — Progress Notes (Signed)
Office Visit Note   Patient: Timothy Wood           Date of Birth: 11/06/1940           MRN: [id number] Visit Date: 05/12/2016              Requested by: [name] [name], MD [address] [place], [place] [address] PCP: [name], MD   Assessment & Plan: Visit Diagnoses:  1. Ganglion of right wrist     Plan: MRI of the right wrist review shows a multiloculated septated ganglion cyst around the ECU tendon.  Discussed r/b/a and he wishes to proceed.  Follow-Up Instructions: Return for 2 week postop visit.   Orders:  No orders of the defined types were placed in this encounter.  No orders of the defined types were placed in this encounter.     Procedures: No procedures performed   Clinical Data: No additional findings.   Subjective: No chief complaint on file.   Patient is coming in today to review his MRI. He continues to be bothered by the ganglion cyst he would like to get it taken out.    Review of Systems   Objective: Vital Signs: There were no vitals taken for this visit.  Physical Exam  Ortho Exam Family right upper extremity is stable. Specialty Comments:  No specialty comments available.  Imaging: No results found.   PMFS History: Patient Active Problem List   Diagnosis Date Noted  . Genetic testing 04/29/2016  . Ganglion of right wrist 04/28/2016  . Depression, major, in partial remission ([place]) 04/16/2016  . Encounter for therapeutic drug monitoring 08/16/2015  . Arterial hypotension   . Alcohol use   . Hand laceration   . Lumbar compression fracture ([place])   . Hypertension 08/05/2015  . Hypotension arterial 08/05/2015  . Pulmonary embolism ([place]) 08/05/2015  . Lung cancer ([place]) 08/05/2015  . Lactic acidosis 08/05/2015  . Syncope 08/05/2015  . CAD (coronary artery disease) 08/05/2015  . Alcohol intoxication ([place]) 08/05/2015  . Kidney disease 08/05/2015  . Laceration of head 08/05/2015  . Laceration of right hand  08/05/2015  . Normocytic anemia 08/05/2015  . Bone lesion 08/05/2015  . Prostate hypertrophy 08/05/2015  . Pulmonary embolus ([place]) 08/05/2015  . Complicated grieving 62/13/0865  . Histoplasmosis with pneumonia ([place]) 04/01/2015  . Malnutrition of moderate degree 01/26/2015  . Adenocarcinoma of right lung ([place]) 01/24/2015  . Coronary atherosclerosis due to calcified coronary lesion of native artery 01/14/2015  . Tremor 09/19/2014  . Infected sebaceous cyst of back 07/21/2013  . Carbuncle and furuncle of trunk 07/10/2013  . COPD GOLD I 03/03/2013  . Grieving 02/06/2013  . Abnormal prostate on physical examination 08/01/2012  . Polyposis coli - attenuated 09/12/2010  . Peripheral vascular disease ([place]) 06/11/2008  . Hyperlipidemia 03/02/2008  . SEBORRHEIC KERATOSIS 03/02/2008  . BACK PAIN, LUMBAR, CHRONIC 09/30/2007  . TOBACCO ABUSE 08/19/2007  . Essential hypertension 08/19/2007  . Multiple pulmonary nodules 08/19/2007   Past Medical History:  Diagnosis Date  . Adenocarcinoma of lung, stage 1 ([place])   . Arthritis   . Blood clotting disorder ([place])   . Chronic obstructive pulmonary disease (COPD) ([place])    "patient not aware"  . Colon polyps   . Depression   . Diverticulosis   . DVT (deep venous thrombosis) ([place])    Post op  knee surgery  . DVT (deep venous thrombosis) ([place])   . GERD (gastroesophageal reflux disease)   . Histoplasmosis   .  Hypercholesteremia   . Hyperlipidemia   . Hypertension   . Lung cancer ([place])   . Personal history of colonic polyps 2008   multiple adenomas 2008-2009 exams with large cecal adenoma  . Pneumonia    in history  . Polyposis coli - attenuated 09/12/2010   2008: adenomas with 9 polyps index and 2.5 cm cecal polyp removed over 3 exams, last 2009 with APC. diverticulosis also. 11/24/2010: 12 polyps removed - adenomas Anticipate routine repeat colonoscopy Spring 2013 Have recommended he see genetics counselor re ? Polyposis 03/27/2016 14 polyps  removed max 15 mm   . Pulmonary nodule 06/2006   9 mm right upper obe pulmonary nodule (negative Pet Scan)  . PVD (peripheral vascular disease) ([place])    left leg s/p stent  . Sebaceous cyst    neck  . Seborrheic keratosis   . Sinusitis   . Tobacco abuse   . Tremor     Family History  Problem Relation Age of Onset  . Colon cancer Mother 44    multiple colon polyps  . Deep vein thrombosis Mother   . Cancer Mother 84    uterine cancer  . Lung cancer Son     smoked and was a Building control surveyor  . Heart disease Brother   . Diabetes    . Heart disease Brother   . Rheum arthritis Daughter   . Rheum arthritis Sister     Past Surgical History:  Procedure Laterality Date  . ANGIOPLASTY / STENTING ILIAC  08/2008   Dr. Trula Slade  . BACK SURGERY  1975, 1978   x2 lumbar fusion over 30 years ago  . BACK SURGERY    . COLONOSCOPY  05/11/2006; 07/27/2006;05/05/2007, 11/24/2010   adenomas with 9 polyps index and 2.5 cm cecal polyp removed over 3 exams, last 2009 with APC. diverticulosis also.  2012: 12 adenomas removed, largest 8-10 mm, diverticulosis  . CORONARY ANGIOPLASTY WITH STENT PLACEMENT    . DESCENDING AORTIC ANEURYSM REPAIR W/ STENT  04/2010   Dr. Trula Slade  . ESOPHAGOGASTRODUODENOSCOPY  05/05/2007   GERD - one erosion  . EYE SURGERY Bilateral    cataract  . KNEE SURGERY  2002   left- cartilage   . LUNG BIOPSY    . PERCUTANEOUS PINNING Left 06/24/2012   Procedure: Repair Complex Laceration Left Thumb with percutaneous pinning.;  Surgeon: Dennie Bible, MD;  Location: [facility];  Service: Plastics;  Laterality: Left;  . THUMB AMPUTATION Left    pinning and index finger  . TONSILLECTOMY  1956  . TONSILLECTOMY    . VIDEO ASSISTED THORACOSCOPY (VATS)/WEDGE RESECTION Right 01/24/2015   Procedure: RIGHT VIDEO ASSISTED THORACOSCOPY (VATS)/WEDGE RESECTION;  Surgeon: [name], MD;  Location: [facility];  Service: Thoracic;  Laterality: Right;  . vocal cord polyps removed     Social History    Occupational History  . Retired Retired   Social History Main Topics  . Smoking status: Current Every Day Smoker    Packs/day: 0.50    Years: 60.00    Types: Cigarettes  . Smokeless tobacco: Never Used  . Alcohol use 0.0 oz/week     Comment: quit drinking after last hospitalization- may 2017  . Drug use: No  . Sexual activity: Not Currently

## 2016-05-15 ENCOUNTER — Telehealth (INDEPENDENT_AMBULATORY_CARE_PROVIDER_SITE_OTHER): Payer: Self-pay | Admitting: Orthopaedic Surgery

## 2016-05-15 ENCOUNTER — Telehealth (INDEPENDENT_AMBULATORY_CARE_PROVIDER_SITE_OTHER): Payer: Self-pay

## 2016-05-15 NOTE — Telephone Encounter (Signed)
Yes. What is he taking?

## 2016-05-15 NOTE — Telephone Encounter (Signed)
Please advise. Thank you

## 2016-05-15 NOTE — Telephone Encounter (Signed)
Patient called asked if he need to stop taking his blood thinner before his surgery. The number to contact patient is [phone]

## 2016-05-15 NOTE — Telephone Encounter (Signed)
Looks like Eliquis

## 2016-05-15 NOTE — Telephone Encounter (Signed)
Yes 3 days before surgery

## 2016-05-15 NOTE — Telephone Encounter (Signed)
Tried to call patient twice, no answer will try again  Monday since I will be out this PM.

## 2016-05-18 ENCOUNTER — Telehealth: Payer: Self-pay | Admitting: Family Medicine

## 2016-05-18 NOTE — Telephone Encounter (Signed)
Pt having cyst removed on wrist on 3/16 would like to know if he should continue the  losartan (COZAAR) 100 MG tablet  Pt states his ortho advised him to call his pcp.

## 2016-05-18 NOTE — Telephone Encounter (Signed)
Called pt to advise

## 2016-05-21 NOTE — Telephone Encounter (Signed)
He may be referring to Eliquis instead Cozaar. Coumadin was discontinued and Eliquis started.  I believe risk of major bleeding with wrist ganglion removal is low. He can hold Eliquis 2 days before procedure. On 05/27/16 he can take the morning dose,hold pm dose and 05/28/16 doses, and resume next day after procedure (05/30/16). Unless he is instructed otherwise by surgeon.  Thanks, BJ

## 2016-05-21 NOTE — Telephone Encounter (Signed)
Called and spoke with patient. Went over the medications and advised about message below. Patient verbalized understanding.

## 2016-05-29 ENCOUNTER — Other Ambulatory Visit: Payer: Self-pay | Admitting: Family Medicine

## 2016-05-29 DIAGNOSIS — M67431 Ganglion, right wrist: Secondary | ICD-10-CM | POA: Diagnosis not present

## 2016-05-29 DIAGNOSIS — I10 Essential (primary) hypertension: Secondary | ICD-10-CM

## 2016-06-07 NOTE — Progress Notes (Signed)
HPI:   [name] is a 76 y.o. male, who is here today to follow on some chronic medical problems.  He was last seen on 04/16/16. Since his last OV he had ganglion right wrist removed by Dr Xu,orthopedists. Denies complications and wound is healing well.  -Hx of PE and DVT, last OV Coumadin was changed for Eliquis. His INR had not been therapeutic and poorly compliance with dietary recommendations. He is tolerating Eliquis well, denies side effects , he needs a 3 months refill.  Still smoking and not willing to quit.  HTN: Last OV Cozaar was increased from 50 mg to 100 mg. He is also on Amlodipine 5 mg daily. BP at home have been elevated, 150's/90's. Denies headache, visual changes, chest pain, worsening dyspnea, palpitation, claudication, focal weakness, or edema.  Lab Results  Component Value Date   CREATININE 1.0 04/06/2016   BUN 10.5 04/06/2016   NA 142 04/06/2016   K 4.5 04/06/2016   CL 105 12/06/2015   CO2 27 04/06/2016   Higher dose Amlodipine caused dizziness.   Depression: He tells me that he realized he has not been taking Celexa for about 1-2 months.He does not feel like he needs it. He has been on this medication for years. He denies worsening depressed mood,crying spells, and denies suicidal thoughts.  He does not have his dog with him today, he was seriously injured by his neighbor's pitbulls dogs. After vet examination and poor prognosis he had to put him asleep. He has felt lonely sine this happened, has had some crying spells.  On the other hand he is excited about going out of town with friends,he is going to different states to fish, which he has done before and really enjoys.  -Lower back pain,no radiated,chronic. Would like a refill for Flexeril,which he has taken in the past and really helped. No recent falls. Pain is exacerbated by prolonged standing and walking.Alleviated by rest. Denies saddle anesthesia.  He has some unstable  gait,chronic.He does not use a cane or walker.   Review of Systems  Constitutional: Negative for appetite change, fatigue and fever.  HENT: Positive for tinnitus (chronic). Negative for mouth sores, nosebleeds and sore throat.   Eyes: Negative for redness and visual disturbance.  Respiratory: Negative for cough, shortness of breath and wheezing.   Cardiovascular: Negative for chest pain, palpitations and leg swelling.  Gastrointestinal: Negative for abdominal pain, nausea and vomiting.       No changes in bowel habits.  Genitourinary: Negative for decreased urine volume and hematuria.  Musculoskeletal: Positive for back pain and gait problem.  Skin: Negative for color change and rash.  Neurological: Positive for tremors (Stable.He follows with [facility]). Negative for dizziness, syncope, weakness and headaches.  Psychiatric/Behavioral: Negative for confusion and suicidal ideas. The patient is not nervous/anxious.       Current Outpatient Prescriptions on File Prior to Visit  Medication Sig Dispense Refill  . amLODipine (NORVASC) 5 MG tablet TAKE ONE TABLET BY MOUTH DAILY 90 tablet 2  . omeprazole (PRILOSEC) 20 MG capsule TAKE 1 CAPSULE (20 MG TOTAL) BY MOUTH 2 (TWO) TIMES DAILY BEFORE A MEAL. (Patient taking differently: Take 20 mg by mouth daily. TAKE 1 CAPSULE (20 MG TOTAL) BY MOUTH 2 (TWO) TIMES DAILY BEFORE A MEAL.) 180 capsule 1  . thiamine (VITAMIN B-1) 100 MG tablet Take 1 tablet (100 mg total) by mouth daily. 90 tablet 2   Current Facility-Administered Medications on File Prior to  Visit  Medication Dose Route Frequency Provider Last Rate Last Dose  . 0.9 %  sodium chloride infusion  500 mL Intravenous Continuous Gatha Mayer, MD         Past Medical History:  Diagnosis Date  . Adenocarcinoma of lung, stage 1 ([place])   . Arthritis   . Blood clotting disorder ([place])   . Chronic obstructive pulmonary disease (COPD) ([place])    "patient not aware"  . Colon polyps   . Depression     . Diverticulosis   . DVT (deep venous thrombosis) ([place])    Post op  knee surgery  . DVT (deep venous thrombosis) ([place])   . GERD (gastroesophageal reflux disease)   . Histoplasmosis   . Hypercholesteremia   . Hyperlipidemia   . Hypertension   . Lung cancer ([place])   . Personal history of colonic polyps 2008   multiple adenomas 2008-2009 exams with large cecal adenoma  . Pneumonia    in history  . Polyposis coli - attenuated 09/12/2010   2008: adenomas with 9 polyps index and 2.5 cm cecal polyp removed over 3 exams, last 2009 with APC. diverticulosis also. 11/24/2010: 12 polyps removed - adenomas Anticipate routine repeat colonoscopy Spring 2013 Have recommended he see genetics counselor re ? Polyposis 03/27/2016 14 polyps removed max 15 mm   . Pulmonary nodule 06/2006   9 mm right upper obe pulmonary nodule (negative Pet Scan)  . PVD (peripheral vascular disease) ([place])    left leg s/p stent  . Sebaceous cyst    neck  . Seborrheic keratosis   . Sinusitis   . Tobacco abuse   . Tremor    Allergies  Allergen Reactions  . Codeine Anaphylaxis  . Hydrocodone Itching    Social History   Social History  . Marital status: Married    Spouse name: N/A  . Number of children: 2  . Years of education: N/A   Occupational History  . Retired Retired   Social History Main Topics  . Smoking status: Current Every Day Smoker    Packs/day: 0.50    Years: 60.00    Types: Cigarettes  . Smokeless tobacco: Never Used  . Alcohol use 0.0 oz/week     Comment: quit drinking after last hospitalization- may 2017  . Drug use: No  . Sexual activity: Not Currently   Other Topics Concern  . None   Social History Narrative       Married for over 50 years.  Wife died from lung cancer.     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     Occasional alcohol.      Tobacco use - over 50 pack years.   Father died at age 71 of suicide.   Michele Ellis-daughter emergenc   y  contact    Vitals:   06/08/16 0855  BP: (!) 170/90  Pulse: 69  Resp: 12  O2 sat at RA 97%. Body mass index is 26.23 kg/m.    Physical Exam  Nursing note and vitals reviewed. Constitutional: He is oriented to person, place, and time. He appears well-developed and well-nourished. No distress.  HENT:  Head: Atraumatic.  Mouth/Throat: Oropharynx is clear and moist and mucous membranes are normal.  Eyes: Conjunctivae and EOM are normal.  Cardiovascular: Normal rate and regular rhythm.   No murmur heard. DP pulses present bilateral.   Respiratory: Effort normal and breath sounds normal. No respiratory distress.  GI: Soft. He exhibits  no mass. There is no hepatomegaly. There is no tenderness.  Musculoskeletal: He exhibits no edema or tenderness.       Right wrist: He exhibits normal range of motion, no tenderness and no bony tenderness.  Right wrist ulnar aspect with suture,mild edema,no erythema,no bleeding. No pain upon palpation of lumbar or thoracic paraspinal muscles,bilateral.  Neurological: He is alert and oriented to person, place, and time. He has normal strength. He displays tremor (Mild head and hands tremor.).  Stable gait with no assistance.  Skin: Skin is warm. No erythema.  Psychiatric: He has a normal mood and affect. Cognition and memory are normal.  Well groomed, good eye contact.      ASSESSMENT AND PLAN:   Timothy Wood was seen today for follow-up.  Diagnoses and all orders for this visit:  Hx of pulmonary embolus  Tolerating Eliquis well. No changes in current management. We discussed some siie effects.  Lab Results  Component Value Date   CREATININE 1.0 04/06/2016   BUN 10.5 04/06/2016   NA 142 04/06/2016   K 4.5 04/06/2016   CL 105 12/06/2015   CO2 27 04/06/2016    -     apixaban (ELIQUIS) 5 MG TABS tablet; Take 1 tablet (5 mg total) by mouth 2 (two) times daily.  Essential hypertension  Re-checked 160/90, not well controlled. He was on  Avapro before,it was discontinued due to hypotension and frequent falls. He agrees with changing Cozaar for Avapro,will start low dose and titrate up as tolerated. Instructed to continue monitoring BP and to let me know about readings in 5-7 ays. No changes in Amlodipine. Possible complications of elevated BP discussed. Low salt diet to continue. Annual eye current. F/U in 3-4 months.   -     irbesartan (AVAPRO) 150 MG tablet; Take 1 tablet (150 mg total) by mouth daily.  Chronic bilateral low back pain without sciatica  Some side effects of Flexeril discussed, he has taken medication very occasional and recently he ran of medication that was filled over a year ago. Instructed to take 5-10 mg once daily if needed,ideally at bedtime.  -     cyclobenzaprine (FLEXERIL) 10 MG tablet; Take 0.5-1 tablets (5-10 mg total) by mouth at bedtime.  Major depressive disorder with single episode, in partial remission ([place])  He reports symptoms as stable,not worse after discontinuing medication.  Will hold on pharmacologic treatment for now. Instructed to let me know if symptoms get worse. I will consider Remeron if needed. Instructed about warning signs.  Unstable gait  Fall precautions discussed. Encouraged to use his cane.    -Timothy Wood was advised to return sooner than planned today if new concerns arise.       Bridgid Printz G. [name], MD  [facility]. [place] office.

## 2016-06-08 ENCOUNTER — Encounter: Payer: Self-pay | Admitting: Family Medicine

## 2016-06-08 ENCOUNTER — Ambulatory Visit (INDEPENDENT_AMBULATORY_CARE_PROVIDER_SITE_OTHER): Payer: Medicare HMO | Admitting: Family Medicine

## 2016-06-08 VITALS — BP 170/90 | HR 69 | Resp 12 | Ht 67.0 in | Wt 167.5 lb

## 2016-06-08 DIAGNOSIS — Z86711 Personal history of pulmonary embolism: Secondary | ICD-10-CM

## 2016-06-08 DIAGNOSIS — G8929 Other chronic pain: Secondary | ICD-10-CM | POA: Diagnosis not present

## 2016-06-08 DIAGNOSIS — I10 Essential (primary) hypertension: Secondary | ICD-10-CM | POA: Diagnosis not present

## 2016-06-08 DIAGNOSIS — M545 Low back pain: Secondary | ICD-10-CM

## 2016-06-08 DIAGNOSIS — F324 Major depressive disorder, single episode, in partial remission: Secondary | ICD-10-CM | POA: Diagnosis not present

## 2016-06-08 DIAGNOSIS — R69 Illness, unspecified: Secondary | ICD-10-CM | POA: Diagnosis not present

## 2016-06-08 DIAGNOSIS — R2681 Unsteadiness on feet: Secondary | ICD-10-CM | POA: Insufficient documentation

## 2016-06-08 MED ORDER — APIXABAN 5 MG PO TABS: 5.0000 mg | ORAL_TABLET | Freq: Two times a day (BID) | ORAL | 1 refills | Status: DC

## 2016-06-08 MED ORDER — CYCLOBENZAPRINE HCL 10 MG PO TABS: 5.0000 mg | ORAL_TABLET | Freq: Every day | ORAL | 0 refills | Status: DC

## 2016-06-08 MED ORDER — IRBESARTAN 150 MG PO TABS: 150.0000 mg | ORAL_TABLET | Freq: Every day | ORAL | 1 refills | Status: DC

## 2016-06-08 NOTE — Progress Notes (Signed)
Pre visit review using our clinic review tool, if applicable. No additional management support is needed unless otherwise documented below in the visit note. 

## 2016-06-08 NOTE — Patient Instructions (Signed)
A few things to remember from today's visit:   Essential hypertension - Plan: irbesartan (AVAPRO) 150 MG tablet  Peripheral vascular disease (HCC)  Hx of pulmonary embolus  Chronic bilateral low back pain without sciatica - Plan: cyclobenzaprine (FLEXERIL) 10 MG tablet  Other pulmonary embolism without acute cor pulmonale, unspecified chronicity (HCC) - Plan: apixaban (ELIQUIS) 5 MG TABS tablet   Please be sure medication list is accurate. If a new problem present, please set up appointment sooner than planned today.

## 2016-06-10 ENCOUNTER — Encounter: Payer: Self-pay | Admitting: Family Medicine

## 2016-06-11 ENCOUNTER — Ambulatory Visit (INDEPENDENT_AMBULATORY_CARE_PROVIDER_SITE_OTHER): Payer: Medicare HMO | Admitting: Orthopaedic Surgery

## 2016-06-11 ENCOUNTER — Encounter (INDEPENDENT_AMBULATORY_CARE_PROVIDER_SITE_OTHER): Payer: Self-pay | Admitting: Orthopaedic Surgery

## 2016-06-11 DIAGNOSIS — M67431 Ganglion, right wrist: Secondary | ICD-10-CM

## 2016-06-11 NOTE — Progress Notes (Signed)
Patient is 2 weeks status post right wrist ganglion cyst removal. He is doing well. Denies any pain. Incision is healed. He has expected postoperative swelling. No signs of infection. Pathology showed regular ganglion cyst. The sutures were removed today. I will like to see him back in 4 weeks for recheck. He did tell me that he probably will not follow-up as scheduled since he'll likely be out of town. I discussed with him the signs of infection to watch out for he voiced understanding.

## 2016-06-17 ENCOUNTER — Encounter (HOSPITAL_COMMUNITY): Payer: Self-pay

## 2016-07-06 ENCOUNTER — Telehealth: Payer: Self-pay | Admitting: Family Medicine

## 2016-07-06 DIAGNOSIS — K219 Gastro-esophageal reflux disease without esophagitis: Secondary | ICD-10-CM

## 2016-07-06 DIAGNOSIS — M545 Low back pain: Principal | ICD-10-CM

## 2016-07-06 DIAGNOSIS — G8929 Other chronic pain: Secondary | ICD-10-CM

## 2016-07-06 MED ORDER — CYCLOBENZAPRINE HCL 10 MG PO TABS: 5.0000 mg | ORAL_TABLET | Freq: Every day | ORAL | 0 refills | Status: DC

## 2016-07-06 MED ORDER — OMEPRAZOLE 20 MG PO CPDR: DELAYED_RELEASE_CAPSULE | ORAL | 1 refills | Status: DC

## 2016-07-06 NOTE — Telephone Encounter (Signed)
Rx's sent.   Please advise on the Eliquis

## 2016-07-06 NOTE — Telephone Encounter (Signed)
I do not think Eliquis is causing "balance" issues.He ahs had this problem for a while intermittently, improved after decreasing Amlodipine. Flexeril can cause balance problems, we could hold on this one and monitor. Ok to continue Omeprazole.  Eliquis was recommended because while on Coumadin INR was not at goal. If he wants to stop Eliquis he needs to resume Coumadin at same dose he was taking and arrange appt with coumadin clinic.  Thanks, BJ

## 2016-07-06 NOTE — Telephone Encounter (Signed)
Pt need new Rx for omeprazole and cyclobenzaprine  Pharm:  [facility].  Pt would like for you to know that he has stopped taking his 2 pill of ELIZUIS due to him losing balance stated that Dr. [name] had warned him that this may have some side effects.

## 2016-07-07 NOTE — Telephone Encounter (Signed)
Left voicemail for patient to call the office back.   

## 2016-07-08 NOTE — Telephone Encounter (Signed)
Left voicemail for patient to call the office back.   

## 2016-07-09 NOTE — Telephone Encounter (Signed)
Left voicemail for patient to call the office back.   

## 2016-07-13 NOTE — Telephone Encounter (Signed)
Pts daughter called today and will call back tomorrow.

## 2016-07-13 NOTE — Telephone Encounter (Signed)
He was planning on leaving town,not sure when. If there is not other contact information, phone note can be closed. Thanks, BJ

## 2016-07-13 NOTE — Telephone Encounter (Signed)
Tried cell phone number listed on chart, unable to leave VM due to VM not being set up.

## 2016-07-13 NOTE — Telephone Encounter (Signed)
I have tried to contact patient multiple times and left voicemails. Patient has not answered nor returned phone calls.   Okay to close phone note?

## 2016-07-14 NOTE — Telephone Encounter (Signed)
Daughter returned phone call. Patient is out of town until June, I advised her to let him know to continue to take his Eliquis, and that we can revisit the medication when he comes to see [place] in July. Patient's daughter verbalized understanding.

## 2016-08-12 ENCOUNTER — Telehealth: Payer: Self-pay | Admitting: Family Medicine

## 2016-08-12 DIAGNOSIS — K219 Gastro-esophageal reflux disease without esophagitis: Secondary | ICD-10-CM

## 2016-08-12 DIAGNOSIS — I10 Essential (primary) hypertension: Secondary | ICD-10-CM

## 2016-08-12 MED ORDER — OMEPRAZOLE 20 MG PO CPDR: DELAYED_RELEASE_CAPSULE | ORAL | 1 refills | Status: DC

## 2016-08-12 MED ORDER — IRBESARTAN 150 MG PO TABS: 150.0000 mg | ORAL_TABLET | Freq: Every day | ORAL | 1 refills | Status: DC

## 2016-08-12 NOTE — Telephone Encounter (Signed)
Pt need new Rx for irbesartan and omeprazole   Pharm:  [facility]  [phone]

## 2016-08-12 NOTE — Telephone Encounter (Signed)
Rx's sent to [facility] provided as requested.

## 2016-08-13 ENCOUNTER — Telehealth: Payer: Self-pay

## 2016-08-13 NOTE — Telephone Encounter (Signed)
Received PA request for Omeprazole 20 mg capsules. PA submitted & is pending. Key: [id number]

## 2016-09-27 NOTE — Progress Notes (Deleted)
HPI:   Timothy Wood is a 76 y.o. male, who is here today for 4 months follow-up.  I saw him last on 06/08/16.  Hypertension:    Currently on Avapro 150 mg daily and Amlodipine 5 mg daily.    ***taking medications as instructed, no side effects reported.  ***has not noted unusual headache, visual changes, exertional chest pain, dyspnea,  focal weakness, or edema.   Lab Results  Component Value Date   CREATININE 1.0 04/06/2016   BUN 10.5 04/06/2016   NA 142 04/06/2016   K 4.5 04/06/2016   CL 105 12/06/2015   CO2 27 04/06/2016    Hx of PE, he is *** on Eliquis.   Chronic back pain: He is on Cyclobenzaprine 10 mg daily as needed. Pain is exacerbated by prolonged standing.walking,and when getting up after prolonged siting.   Concerns today: ***     Review of Systems    Current Outpatient Prescriptions on File Prior to Visit  Medication Sig Dispense Refill  . amLODipine (NORVASC) 5 MG tablet TAKE ONE TABLET BY MOUTH DAILY 90 tablet 2  . apixaban (ELIQUIS) 5 MG TABS tablet Take 1 tablet (5 mg total) by mouth 2 (two) times daily. 180 tablet 1  . cyclobenzaprine (FLEXERIL) 10 MG tablet Take 0.5-1 tablets (5-10 mg total) by mouth at bedtime. 30 tablet 0  . irbesartan (AVAPRO) 150 MG tablet Take 1 tablet (150 mg total) by mouth daily. 90 tablet 1  . omeprazole (PRILOSEC) 20 MG capsule TAKE 1 CAPSULE (20 MG TOTAL) BY MOUTH 2 (TWO) TIMES DAILY BEFORE A MEAL. 180 capsule 1  . thiamine (VITAMIN B-1) 100 MG tablet Take 1 tablet (100 mg total) by mouth daily. 90 tablet 2   Current Facility-Administered Medications on File Prior to Visit  Medication Dose Route Frequency Provider Last Rate Last Dose  . 0.9 %  sodium chloride infusion  500 mL Intravenous Continuous Gatha Mayer, MD         Past Medical History:  Diagnosis Date  . Adenocarcinoma of lung, stage 1 ([place])   . Arthritis   . Blood clotting disorder ([place])   . Chronic obstructive pulmonary  disease (COPD) ([place])    "patient not aware"  . Colon polyps   . Depression   . Diverticulosis   . DVT (deep venous thrombosis) ([place])    Post op  knee surgery  . DVT (deep venous thrombosis) ([place])   . GERD (gastroesophageal reflux disease)   . Histoplasmosis   . Hypercholesteremia   . Hyperlipidemia   . Hypertension   . Lung cancer ([place])   . Personal history of colonic polyps 2008   multiple adenomas 2008-2009 exams with large cecal adenoma  . Pneumonia    in history  . Polyposis coli - attenuated 09/12/2010   2008: adenomas with 9 polyps index and 2.5 cm cecal polyp removed over 3 exams, last 2009 with APC. diverticulosis also. 11/24/2010: 12 polyps removed - adenomas Anticipate routine repeat colonoscopy Spring 2013 Have recommended he see genetics counselor re ? Polyposis 03/27/2016 14 polyps removed max 15 mm   . Pulmonary nodule 06/2006   9 mm right upper obe pulmonary nodule (negative Pet Scan)  . PVD (peripheral vascular disease) ([place])    left leg s/p stent  . Sebaceous cyst    neck  . Seborrheic keratosis   . Sinusitis   . Tobacco abuse   . Tremor    Allergies  Allergen Reactions  .  Codeine Anaphylaxis  . Hydrocodone Itching    Social History   Social History  . Marital status: Married    Spouse name: N/A  . Number of children: 2  . Years of education: N/A   Occupational History  . Retired Retired   Social History Main Topics  . Smoking status: Current Every Day Smoker    Packs/day: 0.50    Years: 60.00    Types: Cigarettes  . Smokeless tobacco: Never Used  . Alcohol use 0.0 oz/week     Comment: quit drinking after last hospitalization- may 2017  . Drug use: No  . Sexual activity: Not Currently   Other Topics Concern  . Not on file   Social History Narrative       Married for over 50 years.  Wife died from lung cancer.     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     Occasional alcohol.      Tobacco use - over 50 pack  years.   Father died at age 32 of suicide.   Michele Ellis-daughter emergenc   y contact    There were no vitals filed for this visit. There is no height or weight on file to calculate BMI.   Physical Exam    ASSESSMENT AND PLAN:     There are no diagnoses linked to this encounter.         -[name] was advised to return sooner than planned today if new concerns arise.       Kathi Dohn G. [name], MD  [facility]. [place] office.

## 2016-09-28 ENCOUNTER — Ambulatory Visit: Payer: Medicare HMO | Admitting: Family Medicine

## 2016-09-28 DIAGNOSIS — Z0289 Encounter for other administrative examinations: Secondary | ICD-10-CM

## 2016-09-30 ENCOUNTER — Ambulatory Visit (INDEPENDENT_AMBULATORY_CARE_PROVIDER_SITE_OTHER): Payer: Medicare HMO | Admitting: Family Medicine

## 2016-09-30 ENCOUNTER — Encounter: Payer: Self-pay | Admitting: Family Medicine

## 2016-09-30 VITALS — BP 130/80 | HR 100 | Resp 12 | Ht 67.0 in | Wt 163.0 lb

## 2016-09-30 DIAGNOSIS — R69 Illness, unspecified: Secondary | ICD-10-CM | POA: Diagnosis not present

## 2016-09-30 DIAGNOSIS — R2681 Unsteadiness on feet: Secondary | ICD-10-CM | POA: Diagnosis not present

## 2016-09-30 DIAGNOSIS — M545 Low back pain, unspecified: Secondary | ICD-10-CM

## 2016-09-30 DIAGNOSIS — I10 Essential (primary) hypertension: Secondary | ICD-10-CM | POA: Diagnosis not present

## 2016-09-30 DIAGNOSIS — F324 Major depressive disorder, single episode, in partial remission: Secondary | ICD-10-CM

## 2016-09-30 DIAGNOSIS — G8929 Other chronic pain: Secondary | ICD-10-CM

## 2016-09-30 DIAGNOSIS — I2699 Other pulmonary embolism without acute cor pulmonale: Secondary | ICD-10-CM | POA: Diagnosis not present

## 2016-09-30 LAB — BASIC METABOLIC PANEL
BUN: 14 mg/dL (ref 6–23)
CHLORIDE: 105 meq/L (ref 96–112)
CO2: 29 meq/L (ref 19–32)
Calcium: 9.8 mg/dL (ref 8.4–10.5)
Creatinine, Ser: 1.12 mg/dL (ref 0.40–1.50)
GFR: 67.83 mL/min (ref >60.00)
Glucose, Bld: 111 mg/dL — ABNORMAL HIGH (ref 70–99)
POTASSIUM: 3.8 meq/L (ref 3.5–5.1)
SODIUM: 142 meq/L (ref 135–145)

## 2016-09-30 NOTE — Progress Notes (Signed)
HPI:   Timothy Wood is a 76 y.o. male, who is here today for 4 months follow-up.  I saw him last on 06/08/16.  Hypertension:   Currently on Avapro 150 mg daily and Amlodipine 5 mg daily.  Checking BP sometimes, it is "good." He is taking medications as instructed, no side effects reported.  He denies headache, visual changes, exertional chest pain, dyspnea,  focal weakness, or edema.   Lab Results  Component Value Date   CREATININE 1.0 04/06/2016   BUN 10.5 04/06/2016   NA 142 04/06/2016   K 4.5 04/06/2016   CL 105 12/06/2015   CO2 27 04/06/2016    Hx of PE, he is stil on Eliquis 5 mg bid, he thought it was causing dizziness and called to let us know he was going to discontinued. He stopped med for a few days but resumed it after instructed to continue it. He was on Coumadin but his INR was most of the time subtherapeutic due to dietary changes.  He is tolerating medication well, he denies side effects.  Chronic back pain: He is on Cyclobenzaprine 10 mg daily as needed. Severe pain sometimes, no radiated, and intermittently.  Pain is exacerbated by prolonged standing.walking,and when getting up after prolonged siting.  History of tremor and unstable gait, today it seems more noticeable. He follows with neuro and he feels like tremor is improved.  He denies dizzy spells. No falls recently. Denies alcohol intake since he is back from fishing trip, states that he drank a few beers while he was out of town.   In regard to depression, he feels like it is stable overall history interested in trying medication again because he thinks medication was causing frequent falls. He denies suicidal thoughts. Wife death anniversary was a couple days ago.  He is still smoking, not planning on quitting.   Review of Systems  Constitutional: Negative for activity change, appetite change, fatigue and fever.  HENT: Negative for mouth sores, nosebleeds, sore throat and  trouble swallowing.   Eyes: Negative for redness and visual disturbance.  Respiratory: Negative for cough, shortness of breath and wheezing.   Cardiovascular: Negative for chest pain, palpitations and leg swelling.  Gastrointestinal: Negative for abdominal pain, nausea and vomiting.  Genitourinary: Negative for decreased urine volume, dysuria and hematuria.  Musculoskeletal: Positive for back pain and gait problem.  Neurological: Positive for tremors. Negative for syncope, weakness and headaches.  Psychiatric/Behavioral: Positive for sleep disturbance. Negative for confusion, hallucinations and suicidal ideas. The patient is nervous/anxious.       Current Outpatient Prescriptions on File Prior to Visit  Medication Sig Dispense Refill  . amLODipine (NORVASC) 5 MG tablet TAKE ONE TABLET BY MOUTH DAILY 90 tablet 2  . apixaban (ELIQUIS) 5 MG TABS tablet Take 1 tablet (5 mg total) by mouth 2 (two) times daily. 180 tablet 1  . cyclobenzaprine (FLEXERIL) 10 MG tablet Take 0.5-1 tablets (5-10 mg total) by mouth at bedtime. 30 tablet 0  . irbesartan (AVAPRO) 150 MG tablet Take 1 tablet (150 mg total) by mouth daily. 90 tablet 1  . omeprazole (PRILOSEC) 20 MG capsule TAKE 1 CAPSULE (20 MG TOTAL) BY MOUTH 2 (TWO) TIMES DAILY BEFORE A MEAL. 180 capsule 1  . thiamine (VITAMIN B-1) 100 MG tablet Take 1 tablet (100 mg total) by mouth daily. 90 tablet 2   Current Facility-Administered Medications on File Prior to Visit  Medication Dose Route Frequency Provider Last Rate Last Dose  .  0.9 %  sodium chloride infusion  500 mL Intravenous Continuous Gatha Mayer, MD         Past Medical History:  Diagnosis Date  . Adenocarcinoma of lung, stage 1 ([place])   . Arthritis   . Blood clotting disorder ([place])   . Chronic obstructive pulmonary disease (COPD) ([place])    "patient not aware"  . Colon polyps   . Depression   . Diverticulosis   . DVT (deep venous thrombosis) ([place])    Post op  knee surgery  . DVT  (deep venous thrombosis) ([place])   . GERD (gastroesophageal reflux disease)   . Histoplasmosis   . Hypercholesteremia   . Hyperlipidemia   . Hypertension   . Lung cancer ([place])   . Personal history of colonic polyps 2008   multiple adenomas 2008-2009 exams with large cecal adenoma  . Pneumonia    in history  . Polyposis coli - attenuated 09/12/2010   2008: adenomas with 9 polyps index and 2.5 cm cecal polyp removed over 3 exams, last 2009 with APC. diverticulosis also. 11/24/2010: 12 polyps removed - adenomas Anticipate routine repeat colonoscopy Spring 2013 Have recommended he see genetics counselor re ? Polyposis 03/27/2016 14 polyps removed max 15 mm   . Pulmonary nodule 06/2006   9 mm right upper obe pulmonary nodule (negative Pet Scan)  . PVD (peripheral vascular disease) ([place])    left leg s/p stent  . Sebaceous cyst    neck  . Seborrheic keratosis   . Sinusitis   . Tobacco abuse   . Tremor    Allergies  Allergen Reactions  . Codeine Anaphylaxis  . Hydrocodone Itching    Social History   Social History  . Marital status: Married    Spouse name: N/A  . Number of children: 2  . Years of education: N/A   Occupational History  . Retired Retired   Social History Main Topics  . Smoking status: Current Every Day Smoker    Packs/day: 0.50    Years: 60.00    Types: Cigarettes  . Smokeless tobacco: Never Used  . Alcohol use 0.0 oz/week     Comment: quit drinking after last hospitalization- may 2017  . Drug use: No  . Sexual activity: Not Currently   Other Topics Concern  . None   Social History Narrative       Married for over 50 years.  Wife died from lung cancer.     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     Occasional alcohol.      Tobacco use - over 50 pack years.   Father died at age 49 of suicide.   Michele Ellis-daughter emergenc   y contact    Vitals:   09/30/16 1358  BP: 130/80  Pulse: 100  Resp: 12  )2 sat at RA  94%. Body mass index is 25.53 kg/m.   Physical Exam  Nursing note and vitals reviewed. Constitutional: He is oriented to person, place, and time. He appears well-developed and well-nourished. No distress.  HENT:  Head: Atraumatic.  Mouth/Throat: Oropharynx is clear and moist and mucous membranes are normal.  Eyes: Pupils are equal, round, and reactive to light. Conjunctivae and EOM are normal.  Cardiovascular: Normal rate and regular rhythm.   No murmur heard. Pulses:      Dorsalis pedis pulses are 2+ on the right side, and 2+ on the left side.  Respiratory: Effort normal and breath sounds normal.  No respiratory distress.  GI: Soft. He exhibits no mass. There is no hepatomegaly. There is no tenderness.  Musculoskeletal: He exhibits no edema or tenderness.  No tenderness upon palpation of paraspinal muscles, lumbar and thoracic. But some pain elicited when sitting up from supine position on examination table.   Lymphadenopathy:    He has no cervical adenopathy.  Neurological: He is alert and oriented to person, place, and time. He has normal strength. He displays tremor (Head and hand.).  Mildly unstable gait with no assistance  Skin: Skin is warm. No erythema.  Psychiatric: He has a normal mood and affect.  Well groomed, good eye contact.     ASSESSMENT AND PLAN:   Arsh was seen today for follow-up.  Diagnoses and all orders for this visit:  Essential hypertension  Adequately controlled. No changes in current management. Continue monitoring BP at home. DASH-low salt diet recommended. Eye exam recommended annually. F/U in 6 months, before if needed.  -     Basic metabolic panel  Chronic low back pain without sciatica, unspecified back pain laterality  Side effects of Flexeril discussed. Topical treatment with OTC medications as Asper cream with Lidocaine may also help. Hx of lung cancer, following with oncologists. PET scan 2016 with no abnormal marrow  activity. Hx of DDD L4-5 and L5-S1, lumbar X ray done because lower back pain. For now no further work-up recommended.  Instructed about warning signs.  Major depressive disorder with single episode, in partial remission ([place])  Reported as stable. No suicidal. He is not interested in medications. Instructed about warning signs.  Unstable gait  Fall prevention discussed, he is not using assistance. Some of his meds can increase risk of falls.   Other pulmonary embolism without acute cor pulmonale, unspecified chronicity ([facility])  Continue Eliquis, we discussed benefits when compared with Coumadin as well as side effects.    -[name] was advised to return sooner than planned today if new concerns arise.       [name]. [name], MD  [facility]. [place] office.

## 2016-09-30 NOTE — Patient Instructions (Addendum)
A few things to remember from today's visit:   Essential hypertension - Plan: Basic metabolic panel  Chronic low back pain without sciatica, unspecified back pain laterality  Major depressive disorder with single episode, in partial remission ([place])  Unstable gait  Other pulmonary embolism without acute cor pulmonale, unspecified chronicity ([place])   Timothy Wood, today we have followed on some of your chronic medical problems and they seem to be stable, so no changes in current management today.  Review medication list and be sure it is accurate.  -Remember a healthy diet and regular physical activity are very important for prevention as well as for well being; they also help with many chronic problems, decreasing the need of adding new medications and delaying or preventing possible complications.  I will see you back in 5-6 months.  Fall prevention.    Remember to arrange your follow up appt before leaving today.  Please follow sooner than planned if a new concern arises.

## 2016-10-08 ENCOUNTER — Ambulatory Visit: Payer: Medicare HMO | Admitting: Family Medicine

## 2016-10-12 ENCOUNTER — Other Ambulatory Visit (HOSPITAL_BASED_OUTPATIENT_CLINIC_OR_DEPARTMENT_OTHER): Payer: Medicare HMO

## 2016-10-12 DIAGNOSIS — C3491 Malignant neoplasm of unspecified part of right bronchus or lung: Secondary | ICD-10-CM

## 2016-10-12 LAB — COMPREHENSIVE METABOLIC PANEL
ALBUMIN: 4.1 g/dL (ref 3.5–5.0)
ALK PHOS: 92 U/L (ref 40–150)
ALT: 16 U/L (ref 0–55)
ANION GAP: 9 meq/L (ref 3–11)
AST: 16 U/L (ref 5–34)
BILIRUBIN TOTAL: 0.76 mg/dL (ref 0.20–1.20)
BUN: 17.8 mg/dL (ref 7.0–26.0)
CALCIUM: 9.2 mg/dL (ref 8.4–10.4)
CHLORIDE: 108 meq/L (ref 98–109)
CO2: 26 mEq/L (ref 22–29)
CREATININE: 1.2 mg/dL (ref 0.7–1.3)
EGFR: 56 mL/min/{1.73_m2} — ABNORMAL LOW (ref >90)
Glucose: 113 mg/dl (ref 70–140)
Potassium: 3.7 mEq/L (ref 3.5–5.1)
Sodium: 143 mEq/L (ref 136–145)
TOTAL PROTEIN: 6.9 g/dL (ref 6.4–8.3)

## 2016-10-12 LAB — CBC WITH DIFFERENTIAL/PLATELET
BASO%: 0.5 % (ref 0.0–2.0)
Basophils Absolute: 0 10*3/uL (ref 0.0–0.1)
EOS%: 1.6 % (ref 0.0–7.0)
Eosinophils Absolute: 0.1 10*3/uL (ref 0.0–0.5)
HEMATOCRIT: 50.3 % — AB (ref 38.4–49.9)
HEMOGLOBIN: 17.2 g/dL — AB (ref 13.0–17.1)
LYMPH#: 1.2 10*3/uL (ref 0.9–3.3)
LYMPH%: 19.3 % (ref 14.0–49.0)
MCH: 33 pg (ref 27.2–33.4)
MCHC: 34.2 g/dL (ref 32.0–36.0)
MCV: 96.4 fL (ref 79.3–98.0)
MONO#: 1 10*3/uL — AB (ref 0.1–0.9)
MONO%: 16 % — ABNORMAL HIGH (ref 0.0–14.0)
NEUT%: 62.6 % (ref 39.0–75.0)
NEUTROS ABS: 4 10*3/uL (ref 1.5–6.5)
PLATELETS: 188 10*3/uL (ref 140–400)
RBC: 5.22 10*6/uL (ref 4.20–5.82)
RDW: 14.7 % — ABNORMAL HIGH (ref 11.0–14.6)
WBC: 6.4 10*3/uL (ref 4.0–10.3)

## 2016-10-13 ENCOUNTER — Ambulatory Visit (HOSPITAL_COMMUNITY)
Admission: RE | Admit: 2016-10-13 | Discharge: 2016-10-13 | Disposition: A | Discharge status: Home / Self Care | Payer: Medicare HMO | Source: Ambulatory Visit | Attending: Internal Medicine | Admitting: Internal Medicine

## 2016-10-13 ENCOUNTER — Encounter (HOSPITAL_COMMUNITY): Payer: Self-pay

## 2016-10-13 DIAGNOSIS — N281 Cyst of kidney, acquired: Secondary | ICD-10-CM | POA: Diagnosis not present

## 2016-10-13 DIAGNOSIS — C3490 Malignant neoplasm of unspecified part of unspecified bronchus or lung: Secondary | ICD-10-CM | POA: Diagnosis not present

## 2016-10-13 DIAGNOSIS — J439 Emphysema, unspecified: Secondary | ICD-10-CM | POA: Insufficient documentation

## 2016-10-13 DIAGNOSIS — C3491 Malignant neoplasm of unspecified part of right bronchus or lung: Secondary | ICD-10-CM

## 2016-10-13 DIAGNOSIS — I700 Atherosclerosis of aorta: Secondary | ICD-10-CM | POA: Insufficient documentation

## 2016-10-13 DIAGNOSIS — R918 Other nonspecific abnormal finding of lung field: Secondary | ICD-10-CM | POA: Insufficient documentation

## 2016-10-13 MED ORDER — IOPAMIDOL (ISOVUE-300) INJECTION 61%
75.0000 mL | Freq: Once | INTRAVENOUS | Status: AC | PRN
  Administered 2016-10-13: 75 mL via INTRAVENOUS

## 2016-10-13 MED ORDER — IOPAMIDOL (ISOVUE-300) INJECTION 61%: 100.0000 mL | Freq: Once | INTRAVENOUS | Status: DC | PRN

## 2016-10-13 MED ORDER — IOPAMIDOL (ISOVUE-300) INJECTION 61%
INTRAVENOUS | Status: AC
  Filled 2016-10-13: qty 75

## 2016-10-14 ENCOUNTER — Encounter: Payer: Self-pay | Admitting: Internal Medicine

## 2016-10-14 ENCOUNTER — Ambulatory Visit (HOSPITAL_BASED_OUTPATIENT_CLINIC_OR_DEPARTMENT_OTHER): Payer: Medicare HMO | Admitting: Internal Medicine

## 2016-10-14 ENCOUNTER — Telehealth: Payer: Self-pay | Admitting: Internal Medicine

## 2016-10-14 VITALS — BP 184/96 | HR 76 | Temp 97.5°F | Resp 20 | Ht 67.0 in | Wt 167.2 lb

## 2016-10-14 DIAGNOSIS — C3491 Malignant neoplasm of unspecified part of right bronchus or lung: Secondary | ICD-10-CM | POA: Diagnosis not present

## 2016-10-14 DIAGNOSIS — I10 Essential (primary) hypertension: Secondary | ICD-10-CM

## 2016-10-14 MED ORDER — CLONIDINE HCL 0.1 MG PO TABS
ORAL_TABLET | ORAL | Status: AC
  Filled 2016-10-14: qty 2

## 2016-10-14 MED ORDER — CLONIDINE HCL 0.1 MG PO TABS
0.2000 mg | ORAL_TABLET | Freq: Once | ORAL | Status: AC
  Administered 2016-10-14: 0.2 mg via ORAL

## 2016-10-14 NOTE — Progress Notes (Signed)
[facility] [phone]   [phone]  OFFICE PROGRESS NOTE  [name], [name], MD [place] [address]  DIAGNOSIS: Stage IA (T1a, N0, M0) non-small cell lung cancer, adenocarcinoma  diagnosed in April 2016.  PRIOR THERAPY: Status post right VATS with wedge resection of the right middle lobe as well as wedge resection of the right upper lobe and pleural biopsy under the care of [name] on 01/24/2015.  CURRENT THERAPY: Observation.  INTERVAL HISTORY: Timothy Wood 76 y.o. male returns to the clinic today for follow-up visit. The patient is feeling fine today with no specific complaints. He denied having any chest pain, shortness of breath, cough or hemoptysis. He denied having any fever or chills. He has no nausea, vomiting, diarrhea or constipation. He had repeat CT scan of the chest performed recently and he is here for evaluation and discussion of his scan results.   MEDICAL HISTORY: Past Medical History:  Diagnosis Date  . Adenocarcinoma of lung, stage 1 ([place])   . Arthritis   . Blood clotting disorder ([place])   . Chronic obstructive pulmonary disease (COPD) ([place])    "patient not aware"  . Colon polyps   . Depression   . Diverticulosis   . DVT (deep venous thrombosis) ([place])    Post op  knee surgery  . DVT (deep venous thrombosis) ([place])   . GERD (gastroesophageal reflux disease)   . Histoplasmosis   . Hypercholesteremia   . Hyperlipidemia   . Hypertension   . Lung cancer ([facility])   . Personal history of colonic polyps 2008   multiple adenomas 2008-2009 exams with large cecal adenoma  . Pneumonia    in history  . Polyposis coli - attenuated 09/12/2010   2008: adenomas with 9 polyps index and 2.5 cm cecal polyp removed over 3 exams, last 2009 with APC. diverticulosis also. 11/24/2010: 12 polyps removed - adenomas Anticipate routine repeat colonoscopy Spring 2013 Have recommended he see genetics counselor re ?  Polyposis 03/27/2016 14 polyps removed max 15 mm   . Pulmonary nodule 06/2006   9 mm right upper obe pulmonary nodule (negative Pet Scan)  . PVD (peripheral vascular disease) ([place])    left leg s/p stent  . Sebaceous cyst    neck  . Seborrheic keratosis   . Sinusitis   . Tobacco abuse   . Tremor     ALLERGIES:  is allergic to codeine and hydrocodone.  MEDICATIONS:  Current Outpatient Prescriptions  Medication Sig Dispense Refill  . amLODipine (NORVASC) 5 MG tablet TAKE ONE TABLET BY MOUTH DAILY 90 tablet 2  . apixaban (ELIQUIS) 5 MG TABS tablet Take 1 tablet (5 mg total) by mouth 2 (two) times daily. 180 tablet 1  . cyclobenzaprine (FLEXERIL) 10 MG tablet Take 0.5-1 tablets (5-10 mg total) by mouth at bedtime. 30 tablet 0  . irbesartan (AVAPRO) 150 MG tablet Take 1 tablet (150 mg total) by mouth daily. 90 tablet 1  . omeprazole (PRILOSEC) 20 MG capsule TAKE 1 CAPSULE (20 MG TOTAL) BY MOUTH 2 (TWO) TIMES DAILY BEFORE A MEAL. 180 capsule 1  . thiamine (VITAMIN B-1) 100 MG tablet Take 1 tablet (100 mg total) by mouth daily. 90 tablet 2   Current Facility-Administered Medications  Medication Dose Route Frequency Provider Last Rate Last Dose  . 0.9 %  sodium chloride infusion  500 mL Intravenous Continuous Gatha Mayer, MD        SURGICAL  HISTORY:  Past Surgical History:  Procedure Laterality Date  . ANGIOPLASTY / STENTING ILIAC  08/2008   Dr. Trula Slade  . BACK SURGERY  1975, 1978   x2 lumbar fusion over 30 years ago  . BACK SURGERY    . COLONOSCOPY  05/11/2006; 07/27/2006;05/05/2007, 11/24/2010   adenomas with 9 polyps index and 2.5 cm cecal polyp removed over 3 exams, last 2009 with APC. diverticulosis also.  2012: 12 adenomas removed, largest 8-10 mm, diverticulosis  . CORONARY ANGIOPLASTY WITH STENT PLACEMENT    . DESCENDING AORTIC ANEURYSM REPAIR W/ STENT  04/2010   Dr. Trula Slade  . ESOPHAGOGASTRODUODENOSCOPY  05/05/2007   GERD - one erosion  . EYE SURGERY Bilateral    cataract  .  KNEE SURGERY  2002   left- cartilage   . LUNG BIOPSY    . PERCUTANEOUS PINNING Left 06/24/2012   Procedure: Repair Complex Laceration Left Thumb with percutaneous pinning.;  Surgeon: Dennie Bible, MD;  Location: [facility];  Service: Plastics;  Laterality: Left;  . THUMB AMPUTATION Left    pinning and index finger  . TONSILLECTOMY  1956  . TONSILLECTOMY    . VIDEO ASSISTED THORACOSCOPY (VATS)/WEDGE RESECTION Right 01/24/2015   Procedure: RIGHT VIDEO ASSISTED THORACOSCOPY (VATS)/WEDGE RESECTION;  Surgeon: [name], MD;  Location: [facility];  Service: Thoracic;  Laterality: Right;  . vocal cord polyps removed      REVIEW OF SYSTEMS:  A comprehensive review of systems was negative.   PHYSICAL EXAMINATION: General appearance: alert, cooperative and no distress Head: Normocephalic, without obvious abnormality, atraumatic Neck: no adenopathy, no JVD, supple, symmetrical, trachea midline and thyroid not enlarged, symmetric, no tenderness/mass/nodules Lymph nodes: Cervical, supraclavicular, and axillary nodes normal. Resp: clear to auscultation bilaterally Back: symmetric, no curvature. ROM normal. No CVA tenderness. Cardio: regular rate and rhythm, S1, S2 normal, no murmur, click, rub or gallop GI: soft, non-tender; bowel sounds normal; no masses,  no organomegaly Extremities: extremities normal, atraumatic, no cyanosis or edema  ECOG PERFORMANCE STATUS: 0 - Asymptomatic  Blood pressure (!) 176/87, pulse 76, temperature (!) 97.5 F (36.4 C), temperature source Oral, resp. rate 20, height 5\' 7"  (1.702 m), weight 167 lb 3.2 oz (75.8 kg), SpO2 98 %.  LABORATORY DATA: Lab Results  Component Value Date   WBC 6.4 10/12/2016   HGB 17.2 (H) 10/12/2016   HCT 50.3 (H) 10/12/2016   MCV 96.4 10/12/2016   PLT 188 10/12/2016      Chemistry      Component Value Date/Time   NA 143 10/12/2016 0958   K 3.7 10/12/2016 0958   CL 105 09/30/2016 1432   CO2 26 10/12/2016 0958   BUN 17.8  10/12/2016 0958   CREATININE 1.2 10/12/2016 0958      Component Value Date/Time   CALCIUM 9.2 10/12/2016 0958   ALKPHOS 92 10/12/2016 0958   AST 16 10/12/2016 0958   ALT 16 10/12/2016 0958   BILITOT 0.76 10/12/2016 0958       RADIOGRAPHIC STUDIES: Ct Chest W Contrast  Result Date: 10/13/2016 CLINICAL DATA:  Lung cancer EXAM: CT CHEST WITH CONTRAST TECHNIQUE: Multidetector CT imaging of the chest was performed during intravenous contrast administration. CONTRAST:  87mL ISOVUE-300 IOPAMIDOL (ISOVUE-300) INJECTION 61% COMPARISON:  04/06/2016 FINDINGS: Cardiovascular: The heart size is normal. No pericardial effusion. Coronary artery calcification is noted. Atherosclerotic calcification is noted in the wall of the thoracic aorta. Mediastinum/Nodes: Scattered upper normal sized lymph mediastinal nodes are stable. Upper normal hilar lymph nodes are also  unchanged. The esophagus has normal imaging features. There is no axillary lymphadenopathy. Lungs/Pleura: Centrilobular and paraseptal emphysema. Surgical scarring in the right lung is stable. 3 mm left upper lobe nodule seen medially on image 68 of series 7 is new in the interval. Tiny right apical nodule seen image 20 series 7 is unchanged. No focal airspace consolidation. No pulmonary edema or pleural effusion. Upper Abdomen: Stable scattered small hypodense liver lesions compatible with cysts. Multiple cysts in the upper right kidney again noted. Musculoskeletal: Bone windows reveal no worrisome lytic or sclerotic osseous lesions. IMPRESSION: 1. No definite findings of recurrent or metastatic disease on today's study. 2. Tiny 3 mm left upper lobe pulmonary nodule is new in the interval. While likely benign, close attention on follow-up recommended. 3. No change in the tiny right apical nodule identified on the prior study. 4.  Emphysema. (ICD10-J43.9) 5.  Aortic Atherosclerois (ICD10-170.0) Electronically Signed   By: Misty Stanley M.D.   On: 10/13/2016  14:02    ASSESSMENT AND PLAN:  This is a very pleasant 76 years old white male with stage IA non-small cell lung cancer diagnosed in April 2016 and has been observation since that time. The patient has several family member with lung cancer. He is currently on observation and the recent CT scan of the chest showed no clear evidence for disease recurrence but there was new tiny 0.3 cm left upper lobe pulmonary nodule. I personally reviewed the scan images and discuss the results with the patient. I recommended for him to continue on observation with repeat CT scan of the chest in 6 months for reevaluation of this pulmonary nodule and to rule out any disease recurrence. For the hypertension, we will give the patient a dose of clonidine 0.2 mg by mouth 1 today. He was also advised to take his blood pressure medication as prescribed and to reconsult with his primary care physician if it stayed elevated. The patient was advised to call immediately if he has any concerning symptoms in the interval. The patient voices understanding of current disease status and treatment options and is in agreement with the current care plan. All questions were answered. The patient knows to call the clinic with any problems, questions or concerns. We can certainly see the patient much sooner if necessary. I spent 10 minutes counseling the patient face to face. The total time spent in the appointment was 15 minutes.  Disclaimer: This note was dictated with voice recognition software. Similar sounding words can inadvertently be transcribed and may not be corrected upon review.

## 2016-10-14 NOTE — Patient Instructions (Signed)
Steps to Quit Smoking Smoking tobacco can be bad for your health. It can also affect almost every organ in your body. Smoking puts you and people around you at risk for many serious long-lasting (chronic) diseases. Quitting smoking is hard, but it is one of the best things that you can do for your health. It is never too late to quit. What are the benefits of quitting smoking? When you quit smoking, you lower your risk for getting serious diseases and conditions. They can include:  Lung cancer or lung disease.  Heart disease.  Stroke.  Heart attack.  Not being able to have children (infertility).  Weak bones (osteoporosis) and broken bones (fractures).  If you have coughing, wheezing, and shortness of breath, those symptoms may get better when you quit. You may also get sick less often. If you are pregnant, quitting smoking can help to lower your chances of having a baby of low birth weight. What can I do to help me quit smoking? Talk with your doctor about what can help you quit smoking. Some things you can do (strategies) include:  Quitting smoking totally, instead of slowly cutting back how much you smoke over a period of time.  Going to in-person counseling. You are more likely to quit if you go to many counseling sessions.  Using resources and support systems, such as: ? Online chats with a counselor. ? Phone quitlines. ? Printed self-help materials. ? Support groups or group counseling. ? Text messaging programs. ? Mobile phone apps or applications.  Taking medicines. Some of these medicines may have nicotine in them. If you are pregnant or breastfeeding, do not take any medicines to quit smoking unless your doctor says it is okay. Talk with your doctor about counseling or other things that can help you.  Talk with your doctor about using more than one strategy at the same time, such as taking medicines while you are also going to in-person counseling. This can help make  quitting easier. What things can I do to make it easier to quit? Quitting smoking might feel very hard at first, but there is a lot that you can do to make it easier. Take these steps:  Talk to your family and friends. Ask them to support and encourage you.  Call phone quitlines, reach out to support groups, or work with a counselor.  Ask people who smoke to not smoke around you.  Avoid places that make you want (trigger) to smoke, such as: ? Bars. ? Parties. ? Smoke-break areas at work.  Spend time with people who do not smoke.  Lower the stress in your life. Stress can make you want to smoke. Try these things to help your stress: ? Getting regular exercise. ? Deep-breathing exercises. ? Yoga. ? Meditating. ? Doing a body scan. To do this, close your eyes, focus on one area of your body at a time from head to toe, and notice which parts of your body are tense. Try to relax the muscles in those areas.  Download or buy apps on your mobile phone or tablet that can help you stick to your quit plan. There are many free apps, such as QuitGuide from the CDC (Centers for Disease Control and Prevention). You can find more support from smokefree.gov and other websites.  This information is not intended to replace advice given to you by your health care provider. Make sure you discuss any questions you have with your health care provider. Document Released: 12/27/2008 Document   Revised: 10/29/2015 Document Reviewed: 07/17/2014 Elsevier Interactive Patient Education  2018 Elsevier Inc.  

## 2016-10-14 NOTE — Telephone Encounter (Signed)
Gave patient avs report and appointments for January and February. Central radiology will call re scan.

## 2016-12-11 ENCOUNTER — Telehealth: Payer: Self-pay | Admitting: Family Medicine

## 2016-12-11 DIAGNOSIS — I10 Essential (primary) hypertension: Secondary | ICD-10-CM

## 2016-12-11 MED ORDER — IRBESARTAN 150 MG PO TABS: 150.0000 mg | ORAL_TABLET | Freq: Every day | ORAL | 1 refills | Status: DC

## 2016-12-11 MED ORDER — AMLODIPINE BESYLATE 5 MG PO TABS: 5.0000 mg | ORAL_TABLET | Freq: Every day | ORAL | 1 refills | Status: DC

## 2016-12-11 NOTE — Telephone Encounter (Signed)
Timothy Wood called because pt is confused about what he should be taking, and so are they. Pt just got Losartan filled last month. Pt also has been taking irbesartan (AVAPRO) 150 MG tablet  amLODipine (NORVASC) 5 MG tablet  Pharmacy needs to know everything he is on.  Is he supposed to be on all 3 of those meds Pt is out of his meds and they need call back today please.

## 2016-12-11 NOTE — Telephone Encounter (Signed)
He is on Avapro and Amlodipine.  Thanks, BJ

## 2016-12-11 NOTE — Telephone Encounter (Signed)
I called and spoke with the pharmacy. They will d/c the losartan and new rx's for the amlodipine and the irbesartan have been sent in.

## 2016-12-16 DIAGNOSIS — H524 Presbyopia: Secondary | ICD-10-CM | POA: Diagnosis not present

## 2016-12-16 DIAGNOSIS — H26491 Other secondary cataract, right eye: Secondary | ICD-10-CM | POA: Diagnosis not present

## 2016-12-29 DIAGNOSIS — H26491 Other secondary cataract, right eye: Secondary | ICD-10-CM | POA: Diagnosis not present

## 2017-01-04 ENCOUNTER — Other Ambulatory Visit: Payer: Self-pay | Admitting: Family Medicine

## 2017-01-04 DIAGNOSIS — I2699 Other pulmonary embolism without acute cor pulmonale: Secondary | ICD-10-CM

## 2017-01-06 NOTE — Telephone Encounter (Signed)
Note made in error

## 2017-02-11 DIAGNOSIS — J449 Chronic obstructive pulmonary disease, unspecified: Secondary | ICD-10-CM | POA: Diagnosis not present

## 2017-02-11 DIAGNOSIS — E785 Hyperlipidemia, unspecified: Secondary | ICD-10-CM | POA: Diagnosis not present

## 2017-02-11 DIAGNOSIS — H259 Unspecified age-related cataract: Secondary | ICD-10-CM | POA: Diagnosis not present

## 2017-02-11 DIAGNOSIS — H9193 Unspecified hearing loss, bilateral: Secondary | ICD-10-CM | POA: Diagnosis not present

## 2017-02-11 DIAGNOSIS — K219 Gastro-esophageal reflux disease without esophagitis: Secondary | ICD-10-CM | POA: Diagnosis not present

## 2017-02-11 DIAGNOSIS — R69 Illness, unspecified: Secondary | ICD-10-CM | POA: Diagnosis not present

## 2017-02-11 DIAGNOSIS — K08109 Complete loss of teeth, unspecified cause, unspecified class: Secondary | ICD-10-CM | POA: Diagnosis not present

## 2017-02-11 DIAGNOSIS — I739 Peripheral vascular disease, unspecified: Secondary | ICD-10-CM | POA: Diagnosis not present

## 2017-02-11 DIAGNOSIS — Z0000 Encounter for general adult medical examination without abnormal findings: Secondary | ICD-10-CM | POA: Diagnosis not present

## 2017-02-11 DIAGNOSIS — I10 Essential (primary) hypertension: Secondary | ICD-10-CM | POA: Diagnosis not present

## 2017-02-23 ENCOUNTER — Other Ambulatory Visit: Payer: Self-pay | Admitting: Family Medicine

## 2017-02-23 DIAGNOSIS — I10 Essential (primary) hypertension: Secondary | ICD-10-CM

## 2017-04-01 NOTE — Progress Notes (Signed)
[name] is a 77 y.o.male, who is here today to follow on HTN.  Currently he is on Avapro 150 mg daily and Amlodipine 5 mg daily. He is taking medications as instructed, no side effects reported. Home BP readings: 140/90 or less.  He has not noted unusual headache, visual changes, exertional chest pain, dyspnea,  focal weakness, or edema.    Lab Results  Component Value Date   CREATININE 1.2 10/12/2016   BUN 17.8 10/12/2016   NA 143 10/12/2016   K 3.7 10/12/2016   CL 105 09/30/2016   CO2 26 10/12/2016   PE: He is on Eliquis 5 mg bid.  + Smoker. Hx of COPD and lung cancer (adenocarcinoma) ,he follows with oncologist (Dr Julien Nordmann, last seen on 10/14/2016).  Today he is complaining of lower back pain getting worse. Severe sometimes.  He has had pain intermittently for a few years. Pain is not radiated, sharp, exacerbated by standing for about 5 minutes or more. It is also aggravated by some chores around the house like vacuuming. Flexeril is no longer helping.  Alleviated by sitting down. He has no noted saddle anesthesia or changes in urine or bowel incontinence.  No fever, chills, or unusual fatigue. He has not try OTC medication. According to patient he follow with orthopedist in the past. Back brace did not help.  Hx of unstable gait, he has not had falls and states that dizziness has resolved.   History of alcohol abuse, he denies high alcohol intake.   Review of Systems  Constitutional: Negative for activity change, appetite change, fatigue and fever.  HENT: Negative for nosebleeds, sore throat and trouble swallowing.   Eyes: Negative for redness and visual disturbance.  Respiratory: Positive for cough (Occasionally, no more than usual). Negative for shortness of breath and wheezing.   Cardiovascular: Negative for chest pain, palpitations and leg swelling.  Gastrointestinal: Negative for abdominal pain, nausea and vomiting.       Changes in  bowel habits.  Genitourinary: Negative for decreased urine volume, dysuria and hematuria.  Musculoskeletal: Positive for back pain and gait problem.  Skin: Negative for rash and wound.  Neurological: Negative for dizziness, syncope, weakness and headaches.  Psychiatric/Behavioral: Negative for confusion. The patient is not nervous/anxious.      Current Outpatient Medications on File Prior to Visit  Medication Sig Dispense Refill  . apixaban (ELIQUIS) 5 MG TABS tablet Take 1 tablet (5 mg total) by mouth 2 (two) times daily. 180 tablet 1  . ELIQUIS 5 MG TABS tablet TAKE ONE TABLET BY MOUTH TWICE A DAY (DISCONTINUE COUMADIN) 180 tablet 1  . omeprazole (PRILOSEC) 20 MG capsule TAKE 1 CAPSULE (20 MG TOTAL) BY MOUTH 2 (TWO) TIMES DAILY BEFORE A MEAL. 180 capsule 1   Current Facility-Administered Medications on File Prior to Visit  Medication Dose Route Frequency Provider Last Rate Last Dose  . 0.9 %  sodium chloride infusion  500 mL Intravenous Continuous Gatha Mayer, MD         Past Medical History:  Diagnosis Date  . Adenocarcinoma of lung, stage 1 ([place])   . Arthritis   . Blood clotting disorder ([place])   . Chronic obstructive pulmonary disease (COPD) ([place])    "patient not aware"  . Colon polyps   . Depression   . Diverticulosis   . DVT (deep venous thrombosis) ([place])    Post op  knee surgery  . DVT (deep venous thrombosis) ([place])   . GERD (  gastroesophageal reflux disease)   . Histoplasmosis   . Hypercholesteremia   . Hyperlipidemia   . Hypertension   . Lung cancer ([place])   . Personal history of colonic polyps 2008   multiple adenomas 2008-2009 exams with large cecal adenoma  . Pneumonia    in history  . Polyposis coli - attenuated 09/12/2010   2008: adenomas with 9 polyps index and 2.5 cm cecal polyp removed over 3 exams, last 2009 with APC. diverticulosis also. 11/24/2010: 12 polyps removed - adenomas Anticipate routine repeat colonoscopy Spring 2013 Have recommended he see  genetics counselor re ? Polyposis 03/27/2016 14 polyps removed max 15 mm   . Pulmonary nodule 06/2006   9 mm right upper obe pulmonary nodule (negative Pet Scan)  . PVD (peripheral vascular disease) ([place])    left leg s/p stent  . Sebaceous cyst    neck  . Seborrheic keratosis   . Sinusitis   . Tobacco abuse   . Tremor     Allergies  Allergen Reactions  . Codeine Anaphylaxis  . Hydrocodone Itching    Social History   Socioeconomic History  . Marital status: Widowed    Spouse name: None  . Number of children: 2  . Years of education: None  . Highest education level: None  Social Needs  . Financial resource strain: None  . Food insecurity - worry: None  . Food insecurity - inability: None  . Transportation needs - medical: None  . Transportation needs - non-medical: None  Occupational History  . Occupation: Retired    Fish farm manager: RETIRED  Tobacco Use  . Smoking status: Current Every Day Smoker    Packs/day: 0.50    Years: 60.00    Pack years: 30.00    Types: Cigarettes  . Smokeless tobacco: Never Used  Substance and Sexual Activity  . Alcohol use: Yes    Alcohol/week: 0.0 oz    Comment: quit drinking after last hospitalization- may 2017  . Drug use: No  . Sexual activity: Not Currently  Other Topics Concern  . None  Social History Narrative       Married for over 50 years.  Wife died from lung cancer.     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     Occasional alcohol.      Tobacco use - over 50 pack years.   Father died at age 91 of suicide.   Michele Ellis-daughter emergenc   y contact    Vitals:   04/02/17 0825  BP: 140/84  Pulse: 81  Resp: 16  Temp: 97.9 F (36.6 C)  SpO2: 96%   Body mass index is 27.72 kg/m.   Physical Exam  Nursing note and vitals reviewed. Constitutional: He is oriented to person, place, and time. He appears well-developed and well-nourished. No distress.  HENT:  Head: Normocephalic and  atraumatic.  Mouth/Throat: Oropharynx is clear and moist and mucous membranes are normal.  Eyes: Conjunctivae are normal. Pupils are equal, round, and reactive to light.  Cardiovascular: Normal rate and regular rhythm.  No murmur heard. DP pulses present,bilateral.  Respiratory: Effort normal and breath sounds normal. No respiratory distress.  GI: Soft. He exhibits no mass. There is no hepatomegaly. There is no tenderness.  Musculoskeletal: He exhibits no edema or tenderness.       Thoracic back: He exhibits no tenderness and no bony tenderness.       Lumbar back: He exhibits no tenderness and no bony tenderness.  Lymphadenopathy:    He has no cervical adenopathy.  Neurological: He is alert and oriented to person, place, and time. He has normal strength.  Unstable gait. Head and hand tremor (at his baseline) RLS negative bilateral.  Skin: Skin is warm. No erythema.  Psychiatric: He has a normal mood and affect. Cognition and memory are normal.  Well groomed, good eye contact.    ASSESSMENT AND PLAN:   Mr. Rockney was seen today for follow-up.  Diagnoses and all orders for this visit:  Lab Results  Component Value Date   CREATININE 1.05 04/02/2017   BUN 18 04/02/2017   NA 138 04/02/2017   K 4.3 04/02/2017   CL 103 04/02/2017   CO2 29 04/02/2017    Chronic low back pain without sciatica, unspecified back pain laterality  Chronic and getting worse. Lumbar x-ray ordered today. Recommend avoiding activities that exacerbate pain. OTC icy hot or Aspercreme may also help.  Acetaminophen 500 mg 4 times per day. Zanaflex side effects discussed. He is not interested in PT for now. Instructed about warning signs.  -     DG Lumbar Spine Complete; Future -     tiZANidine (ZANAFLEX) 2 MG tablet; Take 1 tablet (2 mg total) by mouth every 12 (twelve) hours as needed for muscle spasms.  Essential hypertension  Overall adequately controlled. No changes in current  management. DASH-Low-salt diet recommended. Eye exam recommended annually. F/U in 6 months, before if needed.  -     Basic metabolic panel -     irbesartan (AVAPRO) 150 MG tablet; Take 1 tablet (150 mg total) by mouth daily. -     amLODipine (NORVASC) 5 MG tablet; Take 1 tablet (5 mg total) by mouth daily.  Unstable gait  Stable overall. Fall precautions discussed. Interested in using any device.  Other orders -     thiamine (VITAMIN B-1) 100 MG tablet; Take 1 tablet (100 mg total) by mouth daily.      -[name] advised to return sooner than planned today if new concerns arise.     Analisse Randle G. [name], MD  [facility]. [place] office.

## 2017-04-02 ENCOUNTER — Ambulatory Visit (INDEPENDENT_AMBULATORY_CARE_PROVIDER_SITE_OTHER): Payer: Medicare HMO | Admitting: Family Medicine

## 2017-04-02 ENCOUNTER — Encounter: Payer: Self-pay | Admitting: Family Medicine

## 2017-04-02 VITALS — BP 140/84 | HR 81 | Temp 97.9°F | Resp 16 | Ht 67.0 in | Wt 177.0 lb

## 2017-04-02 DIAGNOSIS — M545 Low back pain, unspecified: Secondary | ICD-10-CM

## 2017-04-02 DIAGNOSIS — G8929 Other chronic pain: Secondary | ICD-10-CM

## 2017-04-02 DIAGNOSIS — I10 Essential (primary) hypertension: Secondary | ICD-10-CM | POA: Diagnosis not present

## 2017-04-02 DIAGNOSIS — R2681 Unsteadiness on feet: Secondary | ICD-10-CM | POA: Diagnosis not present

## 2017-04-02 LAB — BASIC METABOLIC PANEL
BUN: 18 mg/dL (ref 6–23)
CALCIUM: 9.2 mg/dL (ref 8.4–10.5)
CO2: 29 meq/L (ref 19–32)
Chloride: 103 mEq/L (ref 96–112)
Creatinine, Ser: 1.05 mg/dL (ref 0.40–1.50)
GFR: 72.98 mL/min (ref >60.00)
GLUCOSE: 91 mg/dL (ref 70–99)
Potassium: 4.3 mEq/L (ref 3.5–5.1)
Sodium: 138 mEq/L (ref 135–145)

## 2017-04-02 MED ORDER — TIZANIDINE HCL 2 MG PO TABS: 2.0000 mg | ORAL_TABLET | Freq: Two times a day (BID) | ORAL | 0 refills | Status: DC | PRN

## 2017-04-02 MED ORDER — IRBESARTAN 150 MG PO TABS: 150.0000 mg | ORAL_TABLET | Freq: Every day | ORAL | 1 refills | Status: DC

## 2017-04-02 MED ORDER — VITAMIN B-1 100 MG PO TABS: 100.0000 mg | ORAL_TABLET | Freq: Every day | ORAL | 2 refills | Status: DC

## 2017-04-02 MED ORDER — AMLODIPINE BESYLATE 5 MG PO TABS: 5.0000 mg | ORAL_TABLET | Freq: Every day | ORAL | 1 refills | Status: DC

## 2017-04-02 NOTE — Patient Instructions (Signed)
A few things to remember from today's visit:   Essential hypertension - Plan: Basic metabolic panel, irbesartan (AVAPRO) 150 MG tablet, amLODipine (NORVASC) 5 MG tablet  Chronic low back pain without sciatica, unspecified back pain laterality - Plan: DG Lumbar Spine Complete, tiZANidine (ZANAFLEX) 2 MG tablet    Back pain  chronic The cause of your back pain may not be known. Some common causes of back pain include: 1. Strain of the muscles or ligaments supporting the spine. 2. Wear and tear (degeneration) of the spinal disks. 3. Arthritis. 4. Direct injury to the back.  For many people, back pain may return. Since back pain is rarely dangerous, most people can learn to manage this condition on their own.  HOME CARE INSTRUCTIONS Watch your back pain for any changes. The following actions may help to lessen any discomfort you are feeling:  1. Remain active. It is stressful on your back to sit or stand in one place for long periods of time. Do not sit, drive, or stand in one place for more than 30 minutes at a time. Take short walks on even surfaces as soon as you are able.Try to increase the length of time you walk each day.  2. Exercise regularly as directed by your health care provider. Exercise helps your back heal faster. It also helps avoid future injury by keeping your muscles strong and flexible.  3. Do not stay in bed.Resting more than 1-2 days can delay your recovery.                                                      4. Pay attention to your body when you bend and lift. The most comfortable positions are those that put less stress on your recovering back.  5.  Always use proper lifting techniques, including: Bending your knees. Keeping the load close to your body. Avoiding twisting.  6. Find a comfortable position to sleep. Use a firm mattress and lie on your side with your knees slightly bent. If you lie on your back, put a pillow under your knees.  7. Over the  counter rubbing medications like Icy Hot or Asper cream with Lidocaine may help without significant side effects.  Acetaminophen and/or Aleve/Ibuprofen can be taken if needed and if not contraindications. Local ice and heat may be alternated to reduce pain and spasms. Also massage and even chiropractor treatment.      Muscle relaxants might or might not help, they cause drowsiness among other    side effects. They could also interact with some of medications you may be already taking (medications for depression/anxiety and some pain medications).   8. Maintain a healthy weight. Excess weight puts extra stress on your back and makes it difficult to maintain good posture.   SEEK MEDICAL CARE IF: worsening pain, associated fever, rash/edema on area, pain going to legs or buttocks, numbness/tingling, night pain, or abnormal weight loss.    SEEK IMMEDIATE MEDICAL CARE IF:  1. You develop new bowel or bladder control problems. 2. You have unusual weakness or numbness in your arms or legs. 3. You develop nausea or vomiting. 4. You develop abdominal pain. 5. You feel faint.     Back Exercises The following exercises strengthen the muscles that help to support the back. They also help to keep the  lower back flexible. Doing these exercises can help to prevent back pain or lessen existing pain. If you have back pain or discomfort, try doing these exercises 2-3 times each day or as told by your health care provider. When the pain goes away, do them once each day, but increase the number of times that you repeat the steps for each exercise (do more repetitions). If you do not have back pain or discomfort, do these exercises once each day or as told by your health care provider.   EXERCISES Single Knee to Chest Repeat these steps 3-5 times for each leg: 5. Lie on your back on a firm bed or the floor with your legs extended. 6. Bring one knee to your chest. Your other leg should stay extended and in  contact with the floor. 7. Hold your knee in place by grabbing your knee or thigh. 8. Pull on your knee until you feel a gentle stretch in your lower back. 9. Hold the stretch for 10-30 seconds. 10. Slowly release and straighten your leg.  Pelvic Tilt Repeat these steps 5-10 times: 2. Lie on your back on a firm bed or the floor with your legs extended. 3. Bend your knees so they are pointing toward the ceiling and your feet are flat on the floor. 4. Tighten your lower abdominal muscles to press your lower back against the floor. This motion will tilt your pelvis so your tailbone points up toward the ceiling instead of pointing to your feet or the floor. 5. With gentle tension and even breathing, hold this position for 5-10 seconds.  Cat-Cow Repeat these steps until your lower back becomes more flexible: 1. Get into a hands-and-knees position on a firm surface. Keep your hands under your shoulders, and keep your knees under your hips. You may place padding under your knees for comfort. 2. Let your head hang down, and point your tailbone toward the floor so your lower back becomes rounded like the back of a cat. 3. Hold this position for 5 seconds. 4. Slowly lift your head and point your tailbone up toward the ceiling so your back forms a sagging arch like the back of a cow. 5. Hold this position for 5 seconds.   Press-Ups Repeat these steps 5-10 times: 6. Lie on your abdomen (face-down) on the floor. 7. Place your palms near your head, about shoulder-width apart. 8. While you keep your back as relaxed as possible and keep your hips on the floor, slowly straighten your arms to raise the top half of your body and lift your shoulders. Do not use your back muscles to raise your upper torso. You may adjust the placement of your hands to make yourself more comfortable. 9. Hold this position for 5 seconds while you keep your back relaxed. 10. Slowly return to lying flat on the  floor.   Bridges Repeat these steps 10 times: 1. Lie on your back on a firm surface. 2. Bend your knees so they are pointing toward the ceiling and your feet are flat on the floor. 3. Tighten your buttocks muscles and lift your buttocks off of the floor until your waist is at almost the same height as your knees. You should feel the muscles working in your buttocks and the back of your thighs. If you do not feel these muscles, slide your feet 1-2 inches farther away from your buttocks. 4. Hold this position for 3-5 seconds. 5. Slowly lower your hips to the starting position, and  allow your buttocks muscles to relax completely. If this exercise is too easy, try doing it with your arms crossed over your chest.    Please be sure medication list is accurate. If a new problem present, please set up appointment sooner than planned today.

## 2017-04-14 ENCOUNTER — Inpatient Hospital Stay: Payer: Medicare HMO | Attending: Internal Medicine

## 2017-04-14 ENCOUNTER — Encounter (HOSPITAL_COMMUNITY): Payer: Self-pay

## 2017-04-14 ENCOUNTER — Ambulatory Visit (HOSPITAL_COMMUNITY)
Admission: RE | Admit: 2017-04-14 | Discharge: 2017-04-14 | Disposition: A | Discharge status: Home / Self Care | Payer: Medicare HMO | Source: Ambulatory Visit | Attending: Internal Medicine | Admitting: Internal Medicine

## 2017-04-14 DIAGNOSIS — R911 Solitary pulmonary nodule: Secondary | ICD-10-CM | POA: Insufficient documentation

## 2017-04-14 DIAGNOSIS — C3491 Malignant neoplasm of unspecified part of right bronchus or lung: Secondary | ICD-10-CM

## 2017-04-14 DIAGNOSIS — J984 Other disorders of lung: Secondary | ICD-10-CM | POA: Diagnosis not present

## 2017-04-14 DIAGNOSIS — I10 Essential (primary) hypertension: Secondary | ICD-10-CM | POA: Insufficient documentation

## 2017-04-14 DIAGNOSIS — J439 Emphysema, unspecified: Secondary | ICD-10-CM | POA: Insufficient documentation

## 2017-04-14 DIAGNOSIS — Z9889 Other specified postprocedural states: Secondary | ICD-10-CM | POA: Diagnosis not present

## 2017-04-14 DIAGNOSIS — K7689 Other specified diseases of liver: Secondary | ICD-10-CM | POA: Insufficient documentation

## 2017-04-14 DIAGNOSIS — I700 Atherosclerosis of aorta: Secondary | ICD-10-CM | POA: Diagnosis not present

## 2017-04-14 LAB — COMPREHENSIVE METABOLIC PANEL
ALT: 27 U/L (ref 0–55)
AST: 16 U/L (ref 5–34)
Albumin: 4.2 g/dL (ref 3.5–5.0)
Alkaline Phosphatase: 75 U/L (ref 40–150)
Anion gap: 9 (ref 3–11)
BILIRUBIN TOTAL: 0.7 mg/dL (ref 0.2–1.2)
BUN: 20 mg/dL (ref 7–26)
CALCIUM: 9.3 mg/dL (ref 8.4–10.4)
CHLORIDE: 102 mmol/L (ref 98–109)
CO2: 26 mmol/L (ref 22–29)
CREATININE: 1.31 mg/dL — AB (ref 0.70–1.30)
GFR calc non Af Amer: 51 mL/min — ABNORMAL LOW (ref >60)
GFR, EST AFRICAN AMERICAN: 59 mL/min — AB (ref >60)
Glucose, Bld: 119 mg/dL (ref 70–140)
Potassium: 4.5 mmol/L (ref 3.5–5.1)
Sodium: 137 mmol/L (ref 136–145)
TOTAL PROTEIN: 7 g/dL (ref 6.4–8.3)

## 2017-04-14 LAB — CBC WITH DIFFERENTIAL/PLATELET
BASOS ABS: 0.1 10*3/uL (ref 0.0–0.1)
Basophils Relative: 1 %
Eosinophils Absolute: 0.2 10*3/uL (ref 0.0–0.5)
Eosinophils Relative: 3 %
HEMATOCRIT: 49.5 % (ref 38.4–49.9)
Hemoglobin: 16.7 g/dL (ref 13.0–17.1)
LYMPHS ABS: 1.7 10*3/uL (ref 0.9–3.3)
LYMPHS PCT: 23 %
MCH: 32.9 pg (ref 27.2–33.4)
MCHC: 33.8 g/dL (ref 32.0–36.0)
MCV: 97.5 fL (ref 79.3–98.0)
MONO ABS: 0.9 10*3/uL (ref 0.1–0.9)
Monocytes Relative: 12 %
NEUTROS ABS: 4.5 10*3/uL (ref 1.5–6.5)
Neutrophils Relative %: 61 %
Platelets: 190 10*3/uL (ref 140–400)
RBC: 5.08 MIL/uL (ref 4.20–5.82)
RDW: 13.2 % (ref 11.0–14.6)
WBC: 7.4 10*3/uL (ref 4.0–10.3)

## 2017-04-14 MED ORDER — IOPAMIDOL (ISOVUE-300) INJECTION 61%
75.0000 mL | Freq: Once | INTRAVENOUS | Status: AC | PRN
  Administered 2017-04-14: 75 mL via INTRAVENOUS

## 2017-04-14 MED ORDER — IOPAMIDOL (ISOVUE-300) INJECTION 61%
INTRAVENOUS | Status: AC
  Administered 2017-04-14: 75 mL via INTRAVENOUS
  Filled 2017-04-14: qty 100

## 2017-04-15 ENCOUNTER — Encounter: Payer: Self-pay | Admitting: Internal Medicine

## 2017-04-21 ENCOUNTER — Encounter: Payer: Self-pay | Admitting: Internal Medicine

## 2017-04-21 ENCOUNTER — Telehealth: Payer: Self-pay | Admitting: Internal Medicine

## 2017-04-21 ENCOUNTER — Inpatient Hospital Stay: Payer: Medicare HMO | Attending: Internal Medicine | Admitting: Internal Medicine

## 2017-04-21 DIAGNOSIS — F17210 Nicotine dependence, cigarettes, uncomplicated: Secondary | ICD-10-CM

## 2017-04-21 DIAGNOSIS — Z85118 Personal history of other malignant neoplasm of bronchus and lung: Secondary | ICD-10-CM | POA: Insufficient documentation

## 2017-04-21 DIAGNOSIS — R69 Illness, unspecified: Secondary | ICD-10-CM | POA: Diagnosis not present

## 2017-04-21 DIAGNOSIS — C3491 Malignant neoplasm of unspecified part of right bronchus or lung: Secondary | ICD-10-CM | POA: Diagnosis present

## 2017-04-21 DIAGNOSIS — Z79899 Other long term (current) drug therapy: Secondary | ICD-10-CM | POA: Insufficient documentation

## 2017-04-21 DIAGNOSIS — Z7901 Long term (current) use of anticoagulants: Secondary | ICD-10-CM | POA: Diagnosis not present

## 2017-04-21 DIAGNOSIS — C3490 Malignant neoplasm of unspecified part of unspecified bronchus or lung: Secondary | ICD-10-CM

## 2017-04-21 DIAGNOSIS — I10 Essential (primary) hypertension: Secondary | ICD-10-CM

## 2017-04-21 NOTE — Telephone Encounter (Signed)
Gave patient avs report and appointments for February 2020. Central radiology will call re scan.

## 2017-04-21 NOTE — Progress Notes (Signed)
[facility] [phone]   [phone]  OFFICE PROGRESS NOTE  Wood, Timothy G, MD [place] [address]  DIAGNOSIS: Stage IA (T1a, N0, M0) non-small cell lung cancer, adenocarcinoma  diagnosed in April 2016.  PRIOR THERAPY: Status post right VATS with wedge resection of the right middle lobe as well as wedge resection of the right upper lobe and pleural biopsy under the care of [name] on 01/24/2015.  CURRENT THERAPY: Observation.  INTERVAL HISTORY: Timothy Wood 77 y.o. male returns to the clinic today for 6 months follow-up visit accompanied by his daughter Timothy Wood.  The patient is feeling fine today with no specific complaints.  He denied having any chest pain, shortness of breath, cough or hemoptysis.  He denied having any fever or chills.  He has no nausea, vomiting, diarrhea or constipation.  Unfortunately he continues to smoke at regular basis.  He had repeat CT scan of the chest performed recently and he is here for evaluation and discussion of his discuss results.   MEDICAL HISTORY: Past Medical History:  Diagnosis Date  . Adenocarcinoma of lung, stage 1 ([facility])   . Arthritis   . Blood clotting disorder ([facility])   . Chronic obstructive pulmonary disease (COPD) ([facility])    "patient not aware"  . Colon polyps   . Depression   . Diverticulosis   . DVT (deep venous thrombosis) ([facility])    Post op  knee surgery  . DVT (deep venous thrombosis) ([facility])   . GERD (gastroesophageal reflux disease)   . Histoplasmosis   . Hypercholesteremia   . Hyperlipidemia   . Hypertension   . Lung cancer ([facility])   . Personal history of colonic polyps 2008   multiple adenomas 2008-2009 exams with large cecal adenoma  . Pneumonia    in history  . Polyposis coli - attenuated 09/12/2010   2008: adenomas with 9 polyps index and 2.5 cm cecal polyp removed over 3 exams, last 2009 with APC. diverticulosis also. 11/24/2010: 12 polyps removed  - adenomas Anticipate routine repeat colonoscopy Spring 2013 Have recommended he see genetics counselor re ? Polyposis 03/27/2016 14 polyps removed max 15 mm   . Pulmonary nodule 06/2006   9 mm right upper obe pulmonary nodule (negative Pet Scan)  . PVD (peripheral vascular disease) ([facility])    left leg s/p stent  . Sebaceous cyst    neck  . Seborrheic keratosis   . Sinusitis   . Tobacco abuse   . Tremor     ALLERGIES:  is allergic to codeine and hydrocodone.  MEDICATIONS:  Current Outpatient Medications  Medication Sig Dispense Refill  . amLODipine (NORVASC) 5 MG tablet Take 1 tablet (5 mg total) by mouth daily. 90 tablet 1  . apixaban (ELIQUIS) 5 MG TABS tablet Take 1 tablet (5 mg total) by mouth 2 (two) times daily. 180 tablet 1  . ELIQUIS 5 MG TABS tablet TAKE ONE TABLET BY MOUTH TWICE A DAY (DISCONTINUE COUMADIN) 180 tablet 1  . irbesartan (AVAPRO) 150 MG tablet Take 1 tablet (150 mg total) by mouth daily. 90 tablet 1  . omeprazole (PRILOSEC) 20 MG capsule TAKE 1 CAPSULE (20 MG TOTAL) BY MOUTH 2 (TWO) TIMES DAILY BEFORE A MEAL. 180 capsule 1  . thiamine (VITAMIN B-1) 100 MG tablet Take 1 tablet (100 mg total) by mouth daily. 90 tablet 2  . tiZANidine (ZANAFLEX) 2 MG tablet Take 1 tablet (2 mg total) by mouth every  12 (twelve) hours as needed for muscle spasms. 30 tablet 0   Current Facility-Administered Medications  Medication Dose Route Frequency Provider Last Rate Last Dose  . 0.9 %  sodium chloride infusion  500 mL Intravenous Continuous Gatha Mayer, MD        SURGICAL HISTORY:  Past Surgical History:  Procedure Laterality Date  . ANGIOPLASTY / STENTING ILIAC  08/2008   Dr. Trula Slade  . BACK SURGERY  1975, 1978   x2 lumbar fusion over 30 years ago  . BACK SURGERY    . COLONOSCOPY  05/11/2006; 07/27/2006;05/05/2007, 11/24/2010   adenomas with 9 polyps index and 2.5 cm cecal polyp removed over 3 exams, last 2009 with APC. diverticulosis also.  2012: 12 adenomas removed, largest  8-10 mm, diverticulosis  . CORONARY ANGIOPLASTY WITH STENT PLACEMENT    . DESCENDING AORTIC ANEURYSM REPAIR W/ STENT  04/2010   Dr. Trula Slade  . ESOPHAGOGASTRODUODENOSCOPY  05/05/2007   GERD - one erosion  . EYE SURGERY Bilateral    cataract  . KNEE SURGERY  2002   left- cartilage   . LUNG BIOPSY    . PERCUTANEOUS PINNING Left 06/24/2012   Procedure: Repair Complex Laceration Left Thumb with percutaneous pinning.;  Surgeon: Dennie Bible, MD;  Location: [facility];  Service: Plastics;  Laterality: Left;  . THUMB AMPUTATION Left    pinning and index finger  . TONSILLECTOMY  1956  . TONSILLECTOMY    . VIDEO ASSISTED THORACOSCOPY (VATS)/WEDGE RESECTION Right 01/24/2015   Procedure: RIGHT VIDEO ASSISTED THORACOSCOPY (VATS)/WEDGE RESECTION;  Surgeon: [name], MD;  Location: [facility];  Service: Thoracic;  Laterality: Right;  . vocal cord polyps removed      REVIEW OF SYSTEMS:  A comprehensive review of systems was negative.   PHYSICAL EXAMINATION: General appearance: alert, cooperative and no distress Head: Normocephalic, without obvious abnormality, atraumatic Neck: no adenopathy, no JVD, supple, symmetrical, trachea midline and thyroid not enlarged, symmetric, no tenderness/mass/nodules Lymph nodes: Cervical, supraclavicular, and axillary nodes normal. Resp: clear to auscultation bilaterally Back: symmetric, no curvature. ROM normal. No CVA tenderness. Cardio: regular rate and rhythm, S1, S2 normal, no murmur, click, rub or gallop GI: soft, non-tender; bowel sounds normal; no masses,  no organomegaly Extremities: extremities normal, atraumatic, no cyanosis or edema  ECOG PERFORMANCE STATUS: 0 - Asymptomatic  Blood pressure (!) 152/79, pulse (!) 104, temperature (!) 97.5 F (36.4 C), temperature source Oral, resp. rate 16, height 5\' 7"  (1.702 m), weight 176 lb 6.4 oz (80 kg), SpO2 95 %.  LABORATORY DATA: Lab Results  Component Value Date   WBC 7.4 04/14/2017   HGB 16.7  04/14/2017   HCT 49.5 04/14/2017   MCV 97.5 04/14/2017   PLT 190 04/14/2017      Chemistry      Component Value Date/Time   NA 137 04/14/2017 1041   NA 143 10/12/2016 0958   K 4.5 04/14/2017 1041   K 3.7 10/12/2016 0958   CL 102 04/14/2017 1041   CO2 26 04/14/2017 1041   CO2 26 10/12/2016 0958   BUN 20 04/14/2017 1041   BUN 17.8 10/12/2016 0958   CREATININE 1.31 (H) 04/14/2017 1041   CREATININE 1.2 10/12/2016 0958      Component Value Date/Time   CALCIUM 9.3 04/14/2017 1041   CALCIUM 9.2 10/12/2016 0958   ALKPHOS 75 04/14/2017 1041   ALKPHOS 92 10/12/2016 0958   AST 16 04/14/2017 1041   AST 16 10/12/2016 0958   ALT 27 04/14/2017 1041  ALT 16 10/12/2016 0958   BILITOT 0.7 04/14/2017 1041   BILITOT 0.76 10/12/2016 0958       RADIOGRAPHIC STUDIES: Ct Chest W Contrast  Result Date: 04/14/2017 CLINICAL DATA:  Restaging adenocarcinoma of the right lung. EXAM: CT CHEST WITH CONTRAST TECHNIQUE: Multidetector CT imaging of the chest was performed during intravenous contrast administration. CONTRAST:  75 cc Isovue 300 COMPARISON:  10/13/2016 FINDINGS: Cardiovascular: The heart is normal in size. No pericardial effusion. Stable tortuosity, ectasia and calcification of the thoracic aorta and branch vessels including the coronary arteries. Stable lipomatosis hypertrophy of the intra-atrial septum. The pulmonary arteries are grossly normal. Mediastinum/Nodes: There are small scattered mediastinal and hilar lymph nodes are stable. No new or progressive findings. The esophagus is grossly normal. Lungs/Pleura: Stable small, sub 4 mm pulmonary nodules in the right upper lobe on image 26 and 27. Stable surgical changes involving the right lower lung. No findings for recurrent tumor. Lingular scarring changes are stable. Linear band of scarring change in the left upper lobe medially on image number 78 and 79. The small nodule noted on the prior study is no longer identified. Stable emphysematous  changes and areas of pulmonary scarring. No infiltrates, edema or effusions. No new pulmonary lesions. Upper Abdomen: Stable numerous low-attenuation liver lesions consistent with benign cysts. Small adrenal gland nodules are stable and likely benign adenomas. Musculoskeletal: No significant bony findings. IMPRESSION: 1. Stable postoperative changes involving the right lung. No findings suspicious for residual or recurrent tumor. 2. Stable small scattered mediastinal and hilar lymph nodes. No new or progressive findings. 3. Stable small right apical pulmonary nodules. The left upper lobe nodule seen on the prior study is no longer identified. 4. Stable emphysematous changes and pulmonary scarring. 5. Stable bilateral adrenal gland nodules.  Stable hepatic cysts. Aortic Atherosclerosis (ICD10-I70.0) and Emphysema (ICD10-J43.9). Electronically Signed   By: Marijo Sanes M.D.   On: 04/14/2017 14:52    ASSESSMENT AND PLAN:  This is a very pleasant 77 years old white male with stage IA non-small cell lung cancer diagnosed in April 2016 and has been observation since that time. His recent CT scan of the chest showed no concerning findings for disease progression.  I discussed the scan results with the patient and his daughter and showed them the images.  I recommended for him to continue on observation with repeat CT scan of the chest in 1 year. He was advised to call immediately if he has any concerning symptoms in the interval. For hypertension, he was advised to continue with his current blood pressure medication and reconsult with his primary care physician for adjustment of medications if needed. I also strongly encouraged the patient to quit smoking. The patient voices understanding of current disease status and treatment options and is in agreement with the current care plan. All questions were answered. The patient knows to call the clinic with any problems, questions or concerns. We can certainly see  the patient much sooner if necessary. I spent 10 minutes counseling the patient face to face. The total time spent in the appointment was 15 minutes.  Disclaimer: This note was dictated with voice recognition software. Similar sounding words can inadvertently be transcribed and may not be corrected upon review.

## 2017-04-30 IMAGING — CR DG HAND COMPLETE 3+V*R*
3 series · 3 of 3 positions shown · non-contrast
Comparison: None.

CLINICAL DATA: Right hand laceration.

EXAM:
RIGHT HAND - COMPLETE 3+ VIEW

[PA]
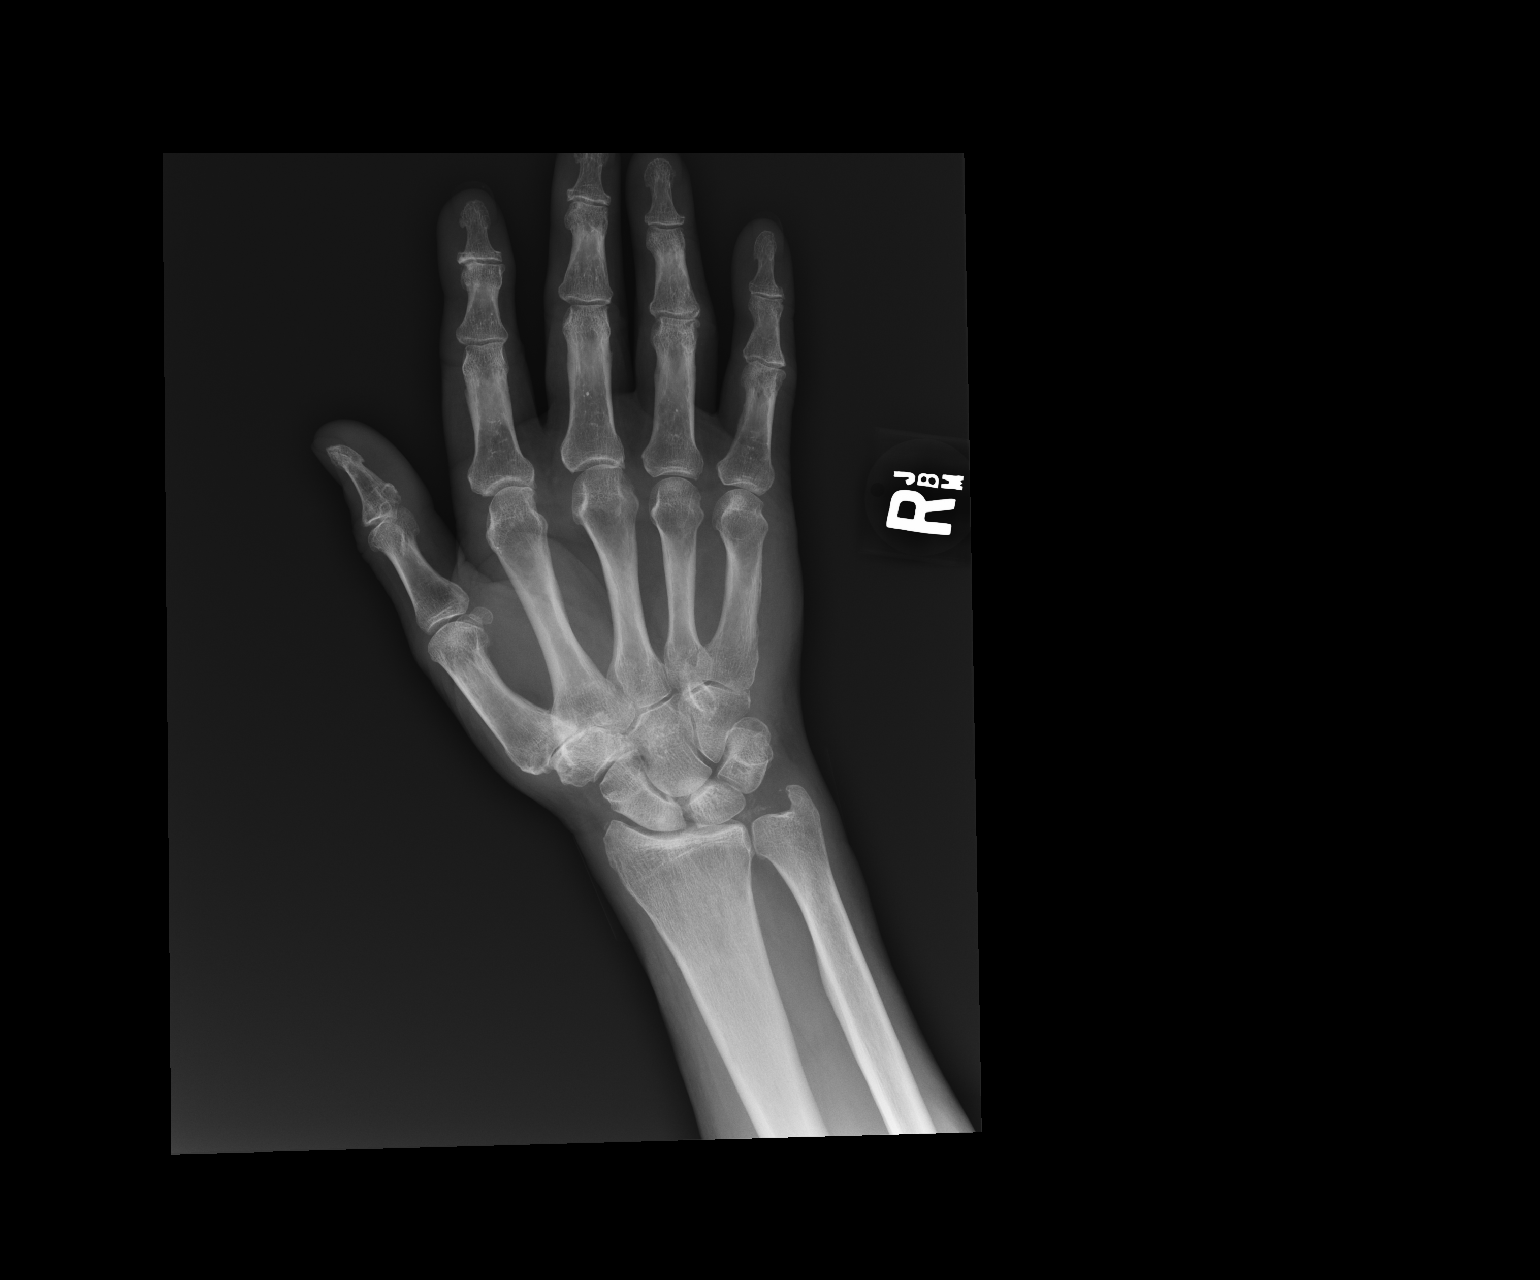

[lateral]
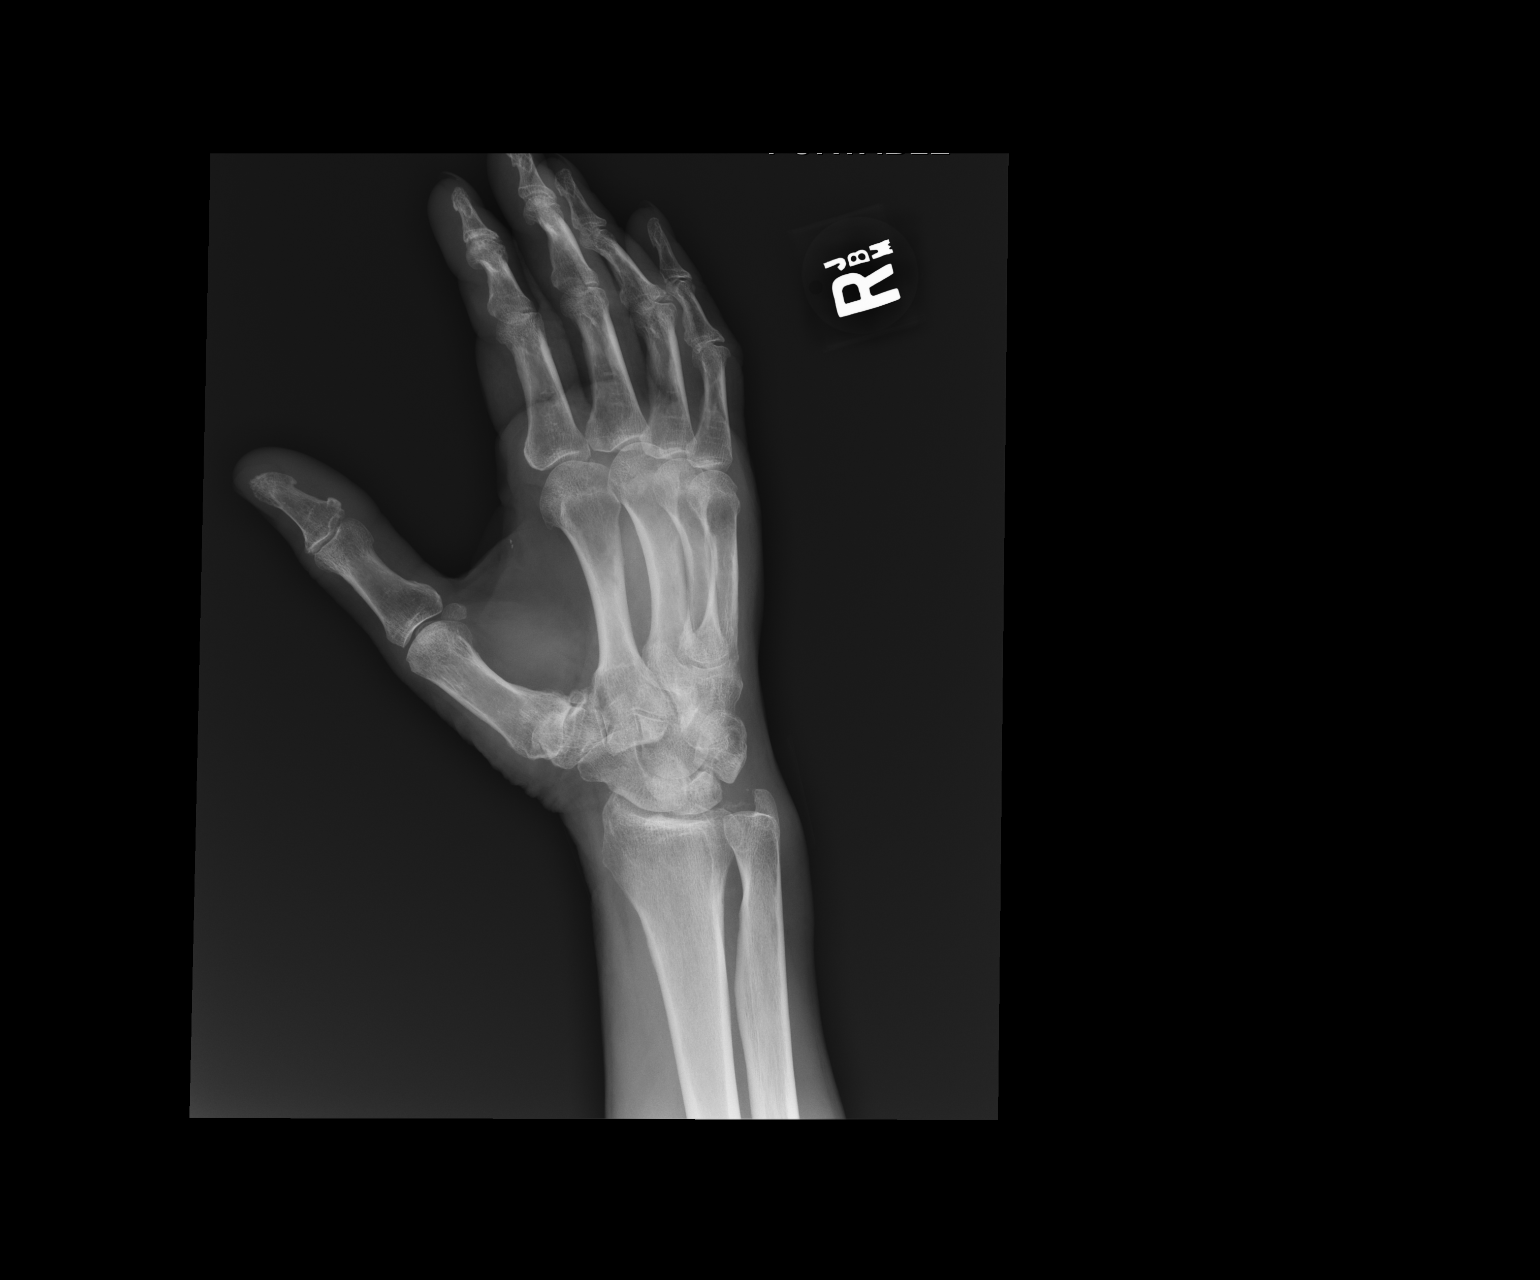

[pa obl]
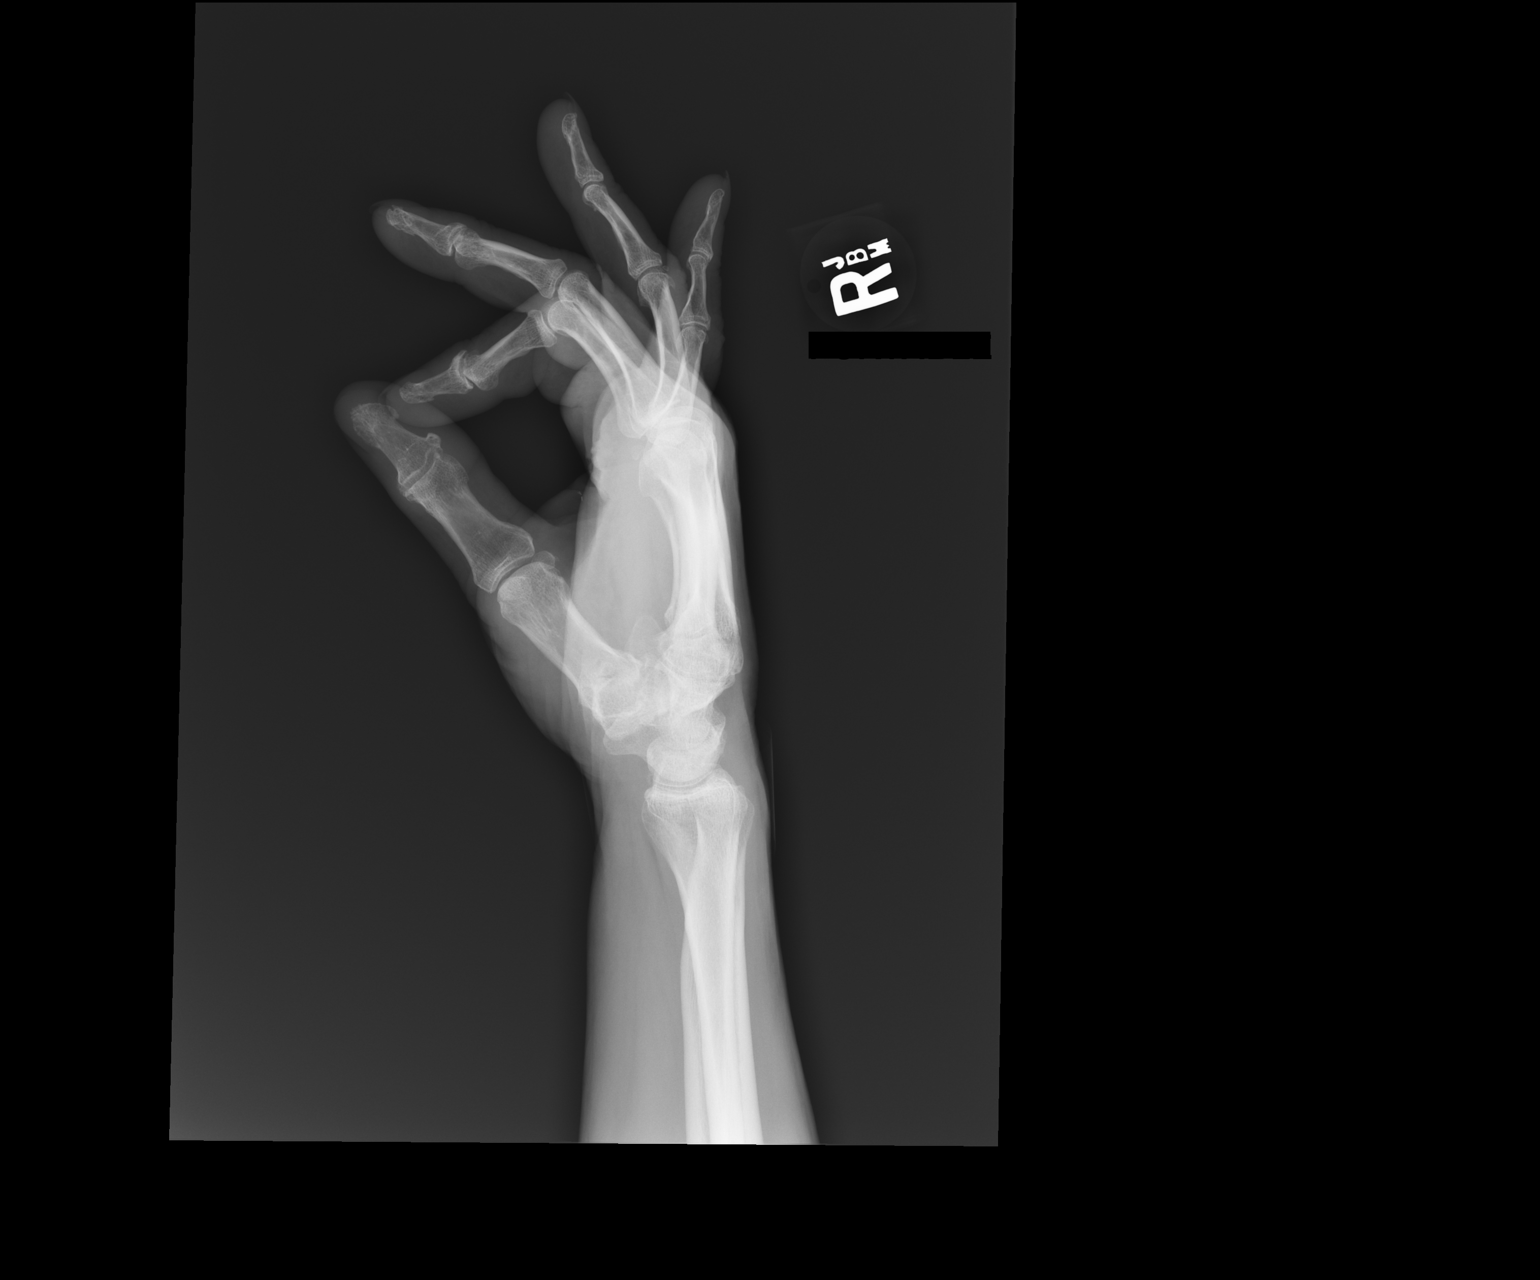

[3 of 3 positions shown; findings below may reference images not displayed]

FINDINGS: There is no evidence of fracture or dislocation. Narrowing and
sclerosis of the first carpometacarpal joint is noted as well as the
second and third distal interphalangeal joints. Soft tissues are
unremarkable.
IMPRESSION: Findings consistent with osteoarthritis. No acute abnormality or
radiopaque foreign body seen in the right hand.

## 2017-04-30 IMAGING — CT CT CHEST W/ CM
2 of 5 series · 12 of 36 positions shown, 15 images · IV contrast (Omni 300)
Comparison: None.

CLINICAL DATA: Found with laceration to the head and loss of
consciousness. History of lung cancer.

EXAM:
CT CHEST, ABDOMEN, AND PELVIS WITH CONTRAST
TECHNIQUE: Multidetector CT imaging of the chest, abdomen and pelvis was
performed following the standard protocol during bolus
administration of intravenous contrast.
CONTRAST:  100mL LCMDIW-MGG IOPAMIDOL (LCMDIW-MGG) INJECTION 61%

[Series 3: cap with 5mm st · axial · 0.74mm/px · z∈[-894,-334]mm · 9 of 142 slices shown, 12 images]
[im 15/142  mediastinal]
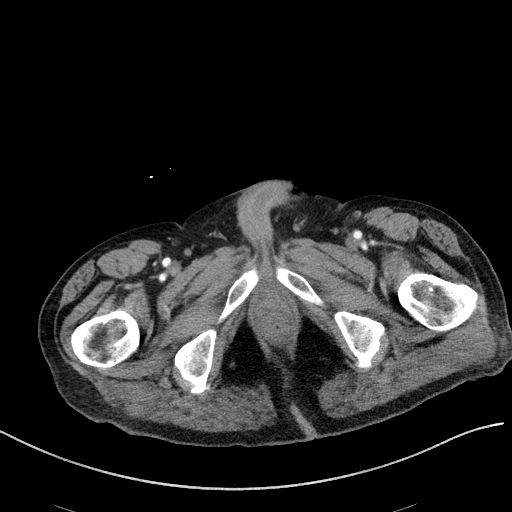
[im 15/142  lung]
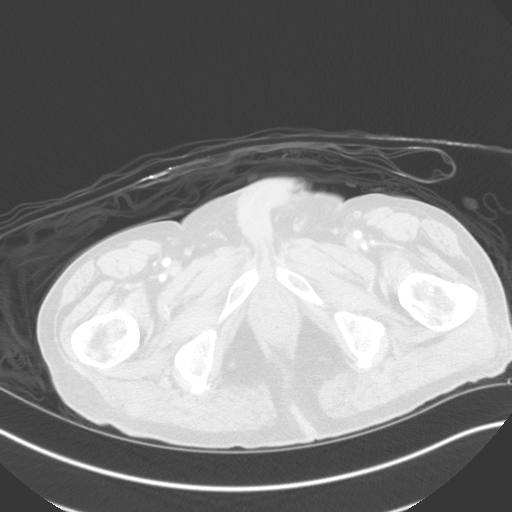
[im 29/142  lung]
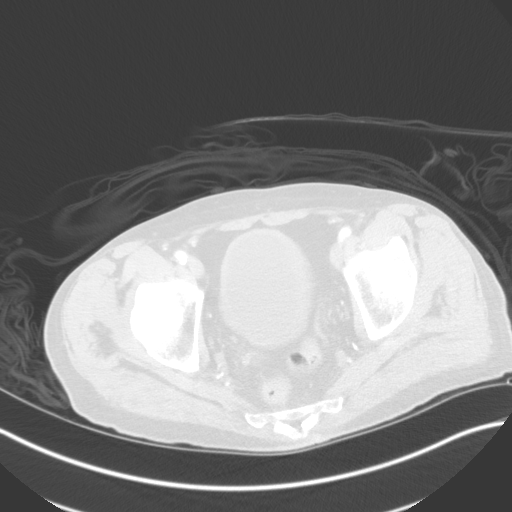
[im 43/142  lung]
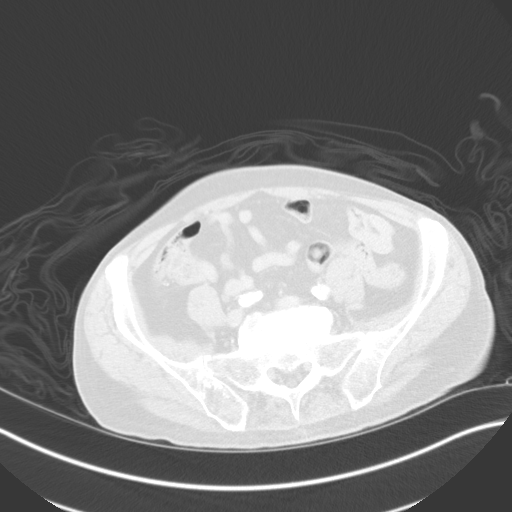
[im 57/142  lung]
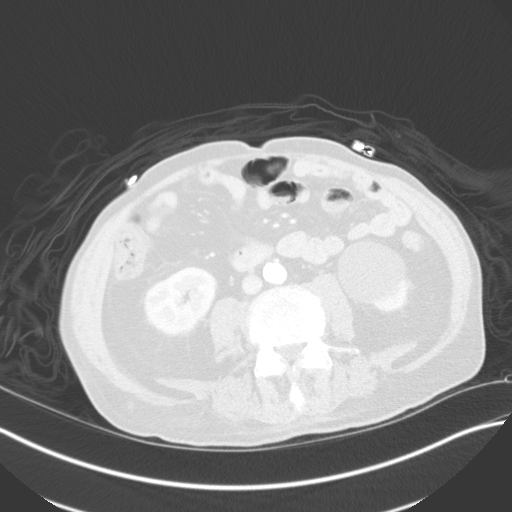
[im 71/142  mediastinal]
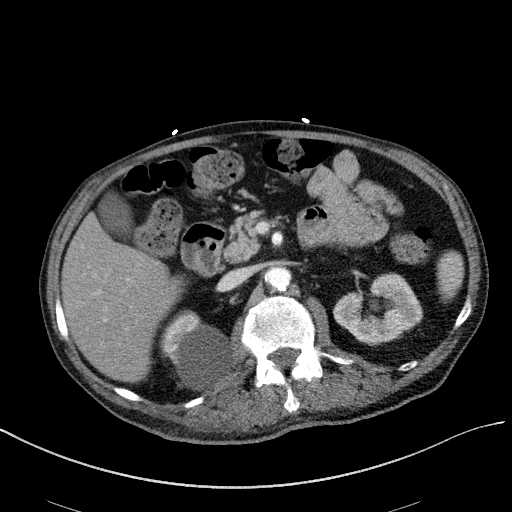
[im 71/142  lung]
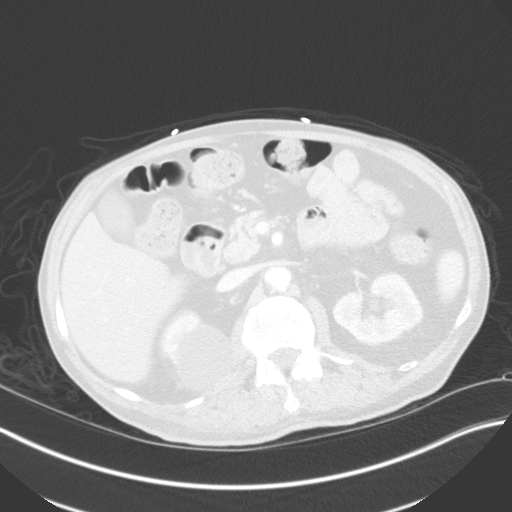
[im 85/142  lung]
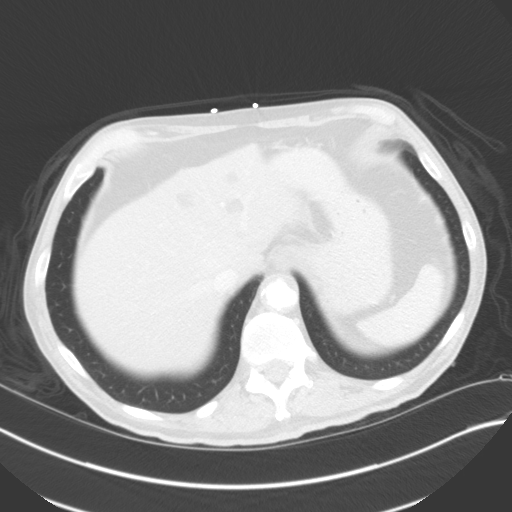
[im 99/142  lung]
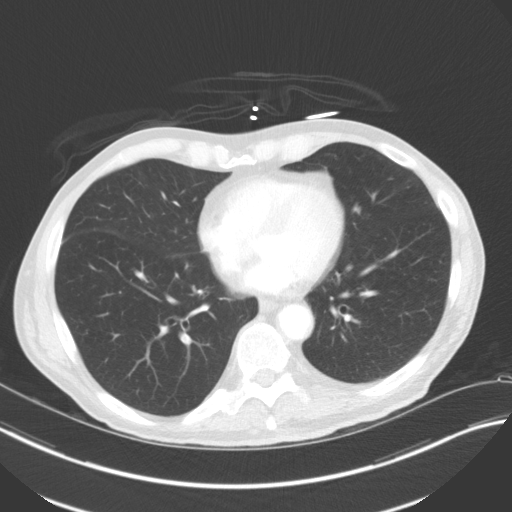
[im 113/142  lung]
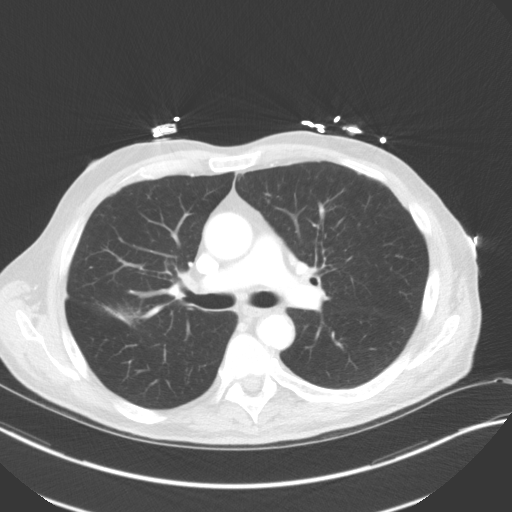
[im 127/142  mediastinal]
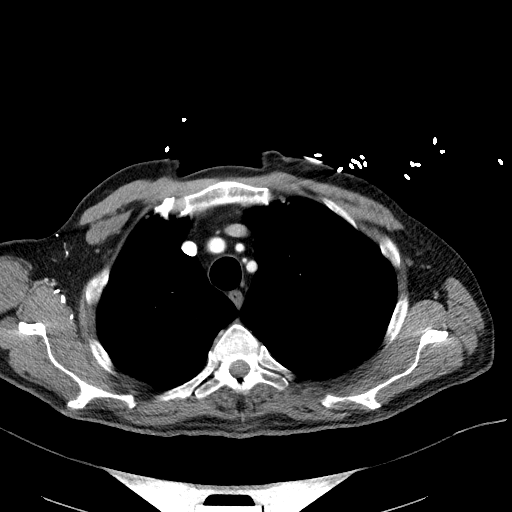
[im 127/142  lung]
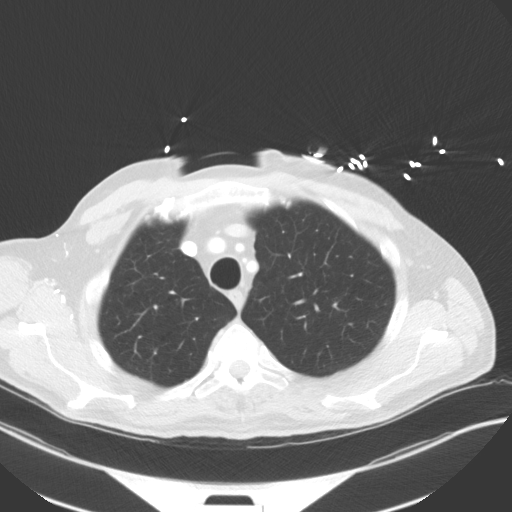

[Series 6: cap with 3mm st cor · coronal · 0.79mm/px · 3 of 121 slices shown]
[im 25/121  lung]
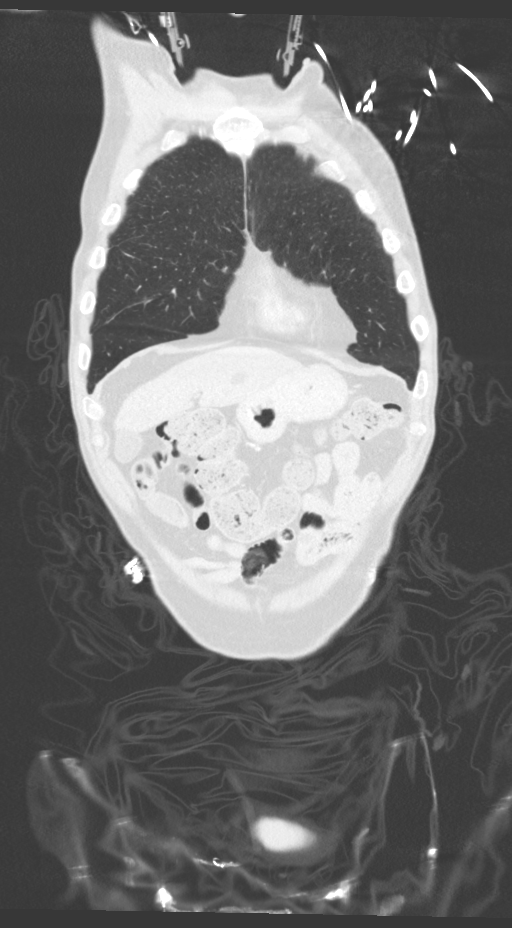
[im 49/121  lung]
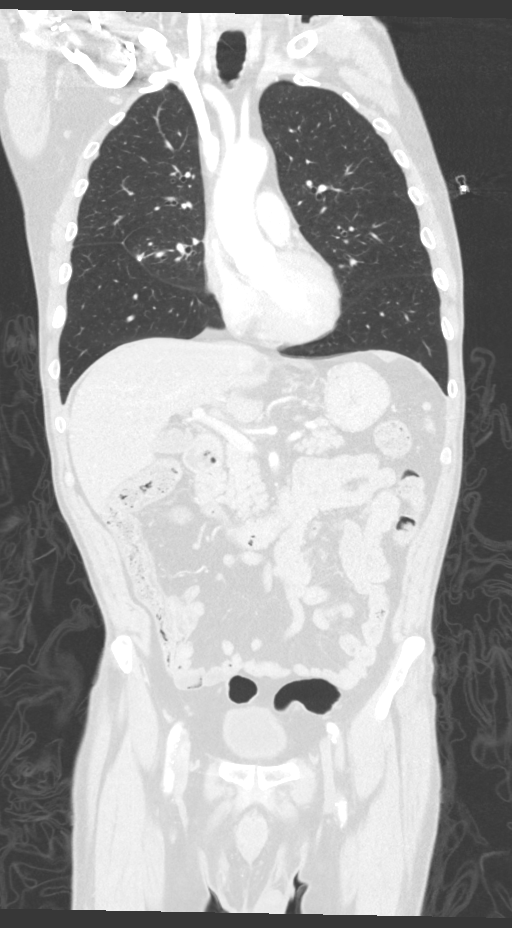
[im 73/121  lung]
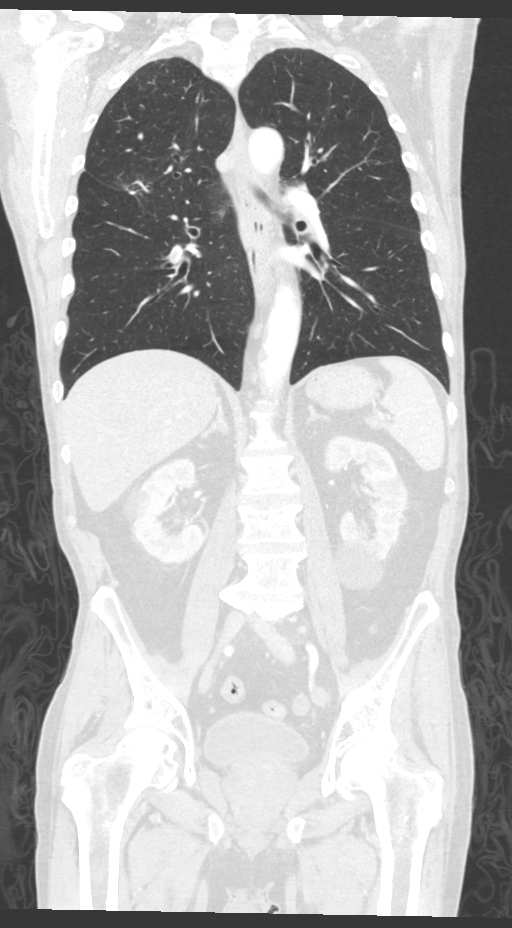

[12 of 36 positions shown; findings below may reference images not displayed]

FINDINGS: CT CHEST FINDINGS

Mediastinum/Lymph Nodes: No masses, or pathologically enlarged lymph
nodes. The heart is normal in size. There is calcified
atherosclerotic disease of the coronary arteries. There is
tortuosity of the aorta with significant calcified and noncalcified
plaque throughout its course. There is a nonocclusive pulmonary
embolus within right lower lobar pulmonary artery, extending into
its segmental basilar branches.

Lungs/Pleura: There is partially calcified posterior right upper
lobe and middle lobe probably postsurgical scarring.

Musculoskeletal: No chest wall mass or suspicious bone lesions
identified.

CT ABDOMEN PELVIS FINDINGS

Hepatobiliary: There are numerous hypoattenuated lesions throughout
the liver, the larger of which measure water density.

Pancreas: No mass, inflammatory changes, or other significant
abnormality.

Spleen: Within normal limits in size and appearance.

Adrenals/Urinary Tract: No masses identified. No evidence of
hydronephrosis. There are bilateral partially exophytic large renal
cysts, measuring up to 5 cm in the left lower pole.

Stomach/Bowel: There is diffuse thickening of the gastric wall. No
evidence of small-bowel obstruction.

Vascular/Lymphatic: No pathologically enlarged lymph nodes. No
evidence of abdominal aortic aneurysm. Atherosclerotic disease with
calcified and noncalcified plaque is noted.

Reproductive: The prostate gland is enlarged and globular with
diffuse thickening of the urinary bladder wall.

Other: None.

Musculoskeletal: There is an acute fracture of the superior
endplates of L2 and L3 vertebral bodies with mild grade 1
compression deformity. There are subacute healing right-sided rib
fractures. There is a mixed lytic and sclerotic appearing lesion
within the right iliac bone measuring approximately 2.9 cm. There is
a sclerotic appearance of the shaft of the left scapula.
IMPRESSION: Nonocclusive right lower lobar pulmonary embolus.

Numerous hypoattenuated lesions throughout the liver, which measure
water density, and by CT criteria represent cysts. Alternatively
these may represent necrotic metastatic lesions.

Simple appearing renal cysts.

Diffuse gastric wall thickening. Direct visualization may be
considered as malignancy cannot be excluded.

Enlarged globular prostate gland with likely secondary diffuse
urinary bladder wall thickening. Please correlate to serum PSA
values.

Acute superior endplate L2 and L3 vertebral body fractures with
grade 1 compression deformities and no alignment abnormality.

Mixed lytic and sclerotic right iliac bone lesion ; metastatic
disease cannot be excluded. Sclerotic appearance of the left
scapula.

Likely postsurgical scarring within the right upper and middle lobes
of the lung.

Advanced calcified and noncalcified atherosclerotic disease of the
aorta.

These results were called by telephone at the time of interpretation
on 08/05/2015 at [DATE] to Dr. AQVAYLI AH , who verbally
acknowledged these results.

## 2017-05-06 ENCOUNTER — Other Ambulatory Visit: Payer: Self-pay | Admitting: Family Medicine

## 2017-05-06 DIAGNOSIS — M545 Low back pain, unspecified: Secondary | ICD-10-CM

## 2017-05-06 DIAGNOSIS — G8929 Other chronic pain: Secondary | ICD-10-CM

## 2017-06-20 DIAGNOSIS — R2242 Localized swelling, mass and lump, left lower limb: Secondary | ICD-10-CM | POA: Diagnosis not present

## 2017-06-20 DIAGNOSIS — T63441A Toxic effect of venom of bees, accidental (unintentional), initial encounter: Secondary | ICD-10-CM | POA: Diagnosis not present

## 2017-06-20 DIAGNOSIS — I959 Hypotension, unspecified: Secondary | ICD-10-CM | POA: Diagnosis not present

## 2017-06-20 DIAGNOSIS — R69 Illness, unspecified: Secondary | ICD-10-CM | POA: Diagnosis not present

## 2017-06-20 DIAGNOSIS — R079 Chest pain, unspecified: Secondary | ICD-10-CM | POA: Diagnosis not present

## 2017-06-20 DIAGNOSIS — L500 Allergic urticaria: Secondary | ICD-10-CM | POA: Diagnosis not present

## 2017-06-20 DIAGNOSIS — J449 Chronic obstructive pulmonary disease, unspecified: Secondary | ICD-10-CM | POA: Diagnosis not present

## 2017-06-20 DIAGNOSIS — T7840XA Allergy, unspecified, initial encounter: Secondary | ICD-10-CM | POA: Diagnosis not present

## 2017-06-20 DIAGNOSIS — I10 Essential (primary) hypertension: Secondary | ICD-10-CM | POA: Diagnosis not present

## 2017-06-20 DIAGNOSIS — Z85118 Personal history of other malignant neoplasm of bronchus and lung: Secondary | ICD-10-CM | POA: Diagnosis not present

## 2017-06-21 ENCOUNTER — Other Ambulatory Visit: Payer: Self-pay | Admitting: Family Medicine

## 2017-06-21 DIAGNOSIS — M545 Low back pain, unspecified: Secondary | ICD-10-CM

## 2017-06-21 DIAGNOSIS — G8929 Other chronic pain: Secondary | ICD-10-CM

## 2017-06-21 MED ORDER — TIZANIDINE HCL 2 MG PO TABS: ORAL_TABLET | ORAL | 0 refills | Status: DC

## 2017-06-21 NOTE — Telephone Encounter (Signed)
Copied from [facility]. Topic: Quick Communication - Rx Refill/Question >> Jun 21, 2017 11:02 AM Boyd Kerbs wrote:  Medication: tiZANidine (ZANAFLEX) 2 MG tablet  This is a one time prescription sent to [facility].   (Agent: If no, request that the patient contact the pharmacy for the refill.)  Preferred Pharmacy (with phone number or street name):  [facility]  [phone] / Fax [phone] Ronnette Hila.  Agent: Please be advised that RX refills may take up to 3 business days. We ask that you follow-up with your pharmacy.

## 2017-06-21 NOTE — Telephone Encounter (Signed)
Rx refill request- out of town  [facility]/ [place]  Zanaflex 2 mg- 1 month supply only

## 2017-07-30 ENCOUNTER — Telehealth: Payer: Self-pay | Admitting: *Deleted

## 2017-07-30 ENCOUNTER — Other Ambulatory Visit: Payer: Self-pay | Admitting: Family Medicine

## 2017-07-30 DIAGNOSIS — M545 Low back pain, unspecified: Secondary | ICD-10-CM

## 2017-07-30 DIAGNOSIS — G8929 Other chronic pain: Secondary | ICD-10-CM

## 2017-07-30 MED ORDER — TIZANIDINE HCL 2 MG PO TABS: ORAL_TABLET | ORAL | 1 refills | Status: DC

## 2017-07-30 NOTE — Telephone Encounter (Signed)
Rx for Zanaflex 2 mg was sent to his pharmacy.  Thanks, BJ

## 2017-07-30 NOTE — Telephone Encounter (Signed)
Patient informed that Rx was sent to pharmacy.

## 2017-07-30 NOTE — Telephone Encounter (Signed)
Requesting new Rx for Tizanidine HCL 2 mg tablet, #30

## 2017-09-09 ENCOUNTER — Other Ambulatory Visit: Payer: Self-pay | Admitting: Family Medicine

## 2017-09-09 DIAGNOSIS — M545 Low back pain, unspecified: Secondary | ICD-10-CM

## 2017-09-09 DIAGNOSIS — I10 Essential (primary) hypertension: Secondary | ICD-10-CM | POA: Diagnosis not present

## 2017-09-09 DIAGNOSIS — G8929 Other chronic pain: Secondary | ICD-10-CM

## 2017-09-09 DIAGNOSIS — K219 Gastro-esophageal reflux disease without esophagitis: Secondary | ICD-10-CM | POA: Diagnosis not present

## 2017-09-09 DIAGNOSIS — Z85118 Personal history of other malignant neoplasm of bronchus and lung: Secondary | ICD-10-CM | POA: Diagnosis not present

## 2017-09-09 DIAGNOSIS — Z8249 Family history of ischemic heart disease and other diseases of the circulatory system: Secondary | ICD-10-CM | POA: Diagnosis not present

## 2017-09-09 DIAGNOSIS — Z7901 Long term (current) use of anticoagulants: Secondary | ICD-10-CM | POA: Diagnosis not present

## 2017-09-09 DIAGNOSIS — M1990 Unspecified osteoarthritis, unspecified site: Secondary | ICD-10-CM | POA: Diagnosis not present

## 2017-09-09 DIAGNOSIS — R69 Illness, unspecified: Secondary | ICD-10-CM | POA: Diagnosis not present

## 2017-09-09 DIAGNOSIS — Z809 Family history of malignant neoplasm, unspecified: Secondary | ICD-10-CM | POA: Diagnosis not present

## 2017-09-09 DIAGNOSIS — I2510 Atherosclerotic heart disease of native coronary artery without angina pectoris: Secondary | ICD-10-CM | POA: Diagnosis not present

## 2017-09-30 NOTE — Progress Notes (Signed)
[name] is a 77 y.o.male, who is here today to follow on HTN and other chronic medical problems.   He was last seen 07/30/17.  Since his last OV he had a ER visit while he was in [place] because wasp sting.He states that he had an anaphylactic reaction.He now has an EpiPen.  Currently he is on Amlodipine 5 mg and Avapro 150 mg daily. [name] Kitchen He is taking medications as instructed, no side effects reported.  He has not noted unusual headache, visual changes, exertional chest pain, dyspnea,  focal weakness, or edema.  BP home every once in a while, 140/80's.   Lab Results  Component Value Date   CREATININE 1.31 (H) 04/14/2017   BUN 20 04/14/2017   NA 137 04/14/2017   K 4.5 04/14/2017   CL 102 04/14/2017   CO2 26 04/14/2017    Hx of PE,he is on Eliquis 5 mg bid. He was on Coumadin before but because not able to keep INR at goal,he was changed to factor Xa inhibitor.  Tolerating medication well. He has not noted gross hematurias,blood in stool,or increased bruising.  Still smoking,reluctant to quit.   Back pain: He is on Zanaflex 2 mg , which he does not feel like helping. No side effects reported. Pain is aggravated by standing up and prolonged walking.  Unstable gait, he has not had falls since his last visit. Occasionally some dizziness when he stands up. Alleviated by rest.   Anxiety and depression: He is on non pharmacologic treatment. He denies depressed mood or suicidal thoughts.  Traveling helps with his mood,he like to go fishing with friends. He has not gotten a new dog because he is not expecting to live 10-16 years from now. He returned from [place] a few weeks ago. He is planning to travelling to [place] in Spring.  He is c/o pruritic skin lesion on left temple, he has had for a blow a year. It is slowly growing. He sates that he has had lesions removed,"precancer." No Hx of skin cancer. He does not use sunscreen.    Review of  Systems  Constitutional: Negative for activity change, appetite change, fatigue and fever.  HENT: Negative for nosebleeds, sore throat and trouble swallowing.   Eyes: Negative for redness and visual disturbance.  Respiratory: Negative for cough, shortness of breath and wheezing.   Cardiovascular: Negative for chest pain, palpitations and leg swelling.  Gastrointestinal: Negative for abdominal pain, nausea and vomiting.  Genitourinary: Negative for decreased urine volume, dysuria and hematuria.  Musculoskeletal: Positive for back pain and gait problem.  Neurological: Negative for syncope, weakness and headaches.  Psychiatric/Behavioral: Negative for confusion and suicidal ideas. The patient is not nervous/anxious.      Current Outpatient Medications on File Prior to Visit  Medication Sig Dispense Refill  . omeprazole (PRILOSEC) 20 MG capsule TAKE 1 CAPSULE (20 MG TOTAL) BY MOUTH 2 (TWO) TIMES DAILY BEFORE A MEAL. 180 capsule 1  . thiamine (VITAMIN B-1) 100 MG tablet Take 1 tablet (100 mg total) by mouth daily. 90 tablet 2  . tiZANidine (ZANAFLEX) 2 MG tablet TAKE ONE TABLET BY MOUTH EVERY 12 HOURS AS NEEDED MUSCLE SPASMS 60 tablet 1   Current Facility-Administered Medications on File Prior to Visit  Medication Dose Route Frequency Provider Last Rate Last Dose  . 0.9 %  sodium chloride infusion  500 mL Intravenous Continuous Gatha Mayer, MD         Past Medical History:  Diagnosis  Date  . Adenocarcinoma of lung, stage 1 ([place])   . Arthritis   . Blood clotting disorder ([place])   . Chronic obstructive pulmonary disease (COPD) ([place])    "patient not aware"  . Colon polyps   . Depression   . Diverticulosis   . DVT (deep venous thrombosis) ([place])    Post op  knee surgery  . DVT (deep venous thrombosis) ([place])   . GERD (gastroesophageal reflux disease)   . Histoplasmosis   . Hypercholesteremia   . Hyperlipidemia   . Hypertension   . Lung cancer ([place])   . Personal history of colonic  polyps 2008   multiple adenomas 2008-2009 exams with large cecal adenoma  . Pneumonia    in history  . Polyposis coli - attenuated 09/12/2010   2008: adenomas with 9 polyps index and 2.5 cm cecal polyp removed over 3 exams, last 2009 with APC. diverticulosis also. 11/24/2010: 12 polyps removed - adenomas Anticipate routine repeat colonoscopy Spring 2013 Have recommended he see genetics counselor re ? Polyposis 03/27/2016 14 polyps removed max 15 mm   . Pulmonary nodule 06/2006   9 mm right upper obe pulmonary nodule (negative Pet Scan)  . PVD (peripheral vascular disease) ([place])    left leg s/p stent  . Sebaceous cyst    neck  . Seborrheic keratosis   . Sinusitis   . Tobacco abuse   . Tremor     Allergies  Allergen Reactions  . Codeine Anaphylaxis  . Hydrocodone Itching    Social History   Socioeconomic History  . Marital status: Widowed    Spouse name: Not on file  . Number of children: 2  . Years of education: Not on file  . Highest education level: Not on file  Occupational History  . Occupation: Retired    Fish farm manager: RETIRED  Social Needs  . Financial resource strain: Not on file  . Food insecurity:    Worry: Not on file    Inability: Not on file  . Transportation needs:    Medical: Not on file    Non-medical: Not on file  Tobacco Use  . Smoking status: Current Every Day Smoker    Packs/day: 0.50    Years: 60.00    Pack years: 30.00    Types: Cigarettes  . Smokeless tobacco: Never Used  Substance and Sexual Activity  . Alcohol use: Yes    Alcohol/week: 0.0 oz    Comment: quit drinking after last hospitalization- may 2017  . Drug use: No  . Sexual activity: Not Currently  Lifestyle  . Physical activity:    Days per week: Not on file    Minutes per session: Not on file  . Stress: Not on file  Relationships  . Social connections:    Talks on phone: Not on file    Gets together: Not on file    Attends religious service: Not on file    Active member of club  or organization: Not on file    Attends meetings of clubs or organizations: Not on file    Relationship status: Not on file  Other Topics Concern  . Not on file  Social History Narrative       Married for over 50 years.  Wife died from lung cancer.     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     Occasional alcohol.      Tobacco use - over 50 pack years.   Father died  at age 25 of suicide.   Michele Ellis-daughter emergenc   y contact    Vitals:   10/01/17 0834  BP: 130/84  Pulse: 89  Resp: 16  Temp: 98.1 F (36.7 C)  SpO2: 94%   Body mass index is 25.24 kg/m.    Physical Exam  Nursing note and vitals reviewed. Constitutional: He is oriented to person, place, and time. He appears well-developed and well-nourished. No distress.  HENT:  Head: Normocephalic and atraumatic.    Mouth/Throat: Oropharynx is clear and moist and mucous membranes are normal. He has dentures.  Eyes: Pupils are equal, round, and reactive to light. Conjunctivae and EOM are normal.  Cardiovascular: Normal rate and regular rhythm.  No murmur heard. DP pulses present,bilateral.  Respiratory: Effort normal and breath sounds normal. No respiratory distress.  GI: Soft. He exhibits no mass. There is no hepatomegaly. There is no tenderness.  Musculoskeletal: He exhibits no edema.       Lumbar back: He exhibits no tenderness and no bony tenderness.  Lymphadenopathy:    He has no cervical adenopathy.  Neurological: He is alert and oriented to person, place, and time. He has normal strength. He displays tremor.  Mildly unstable gait with  No assistance.  Skin: Skin is warm. No erythema.  Scaly lesions left temple,confluent, x 3 (1-2 mm each one). Not erythematous and not tender. See HENT.  Psychiatric: He has a normal mood and affect. Cognition and memory are normal.  Well groomed, good eye contact.    ASSESSMENT AND PLAN:   Mr. Terek was seen today for  follow-up.   Orders Placed This Encounter  Procedures  . Basic metabolic panel   Lab Results  Component Value Date   CREATININE 1.14 10/01/2017   BUN 16 10/01/2017   NA 139 10/01/2017   K 4.5 10/01/2017   CL 102 10/01/2017   CO2 28 10/01/2017     Essential hypertension Otherwise adequately controlled. No changes in current management. Low salt diet to continue. Eye exam recommended annually,he is due F/U in 6 months, before if needed.   Hx of pulmonary embolus Tolerating Eliquis well. Some side effects discussed. Strongly recommend smoking cessation,he is not interested.He tried different treatment unsuccessfully and not interested in trying again.   AK (actinic keratosis) After verbal consent lesions were treated with liquid nitrogen x 3. He tolerated procedure well. Post procedure care instructions given. Recommend big hot and sun screen.    BACK PAIN, LUMBAR, CHRONIC Since he does not feel like Zanaflex is helping,recommend discontinue it. If he realizes  medication is helping,he can resume it. Side effects discussed.    Unstable gait He has not had a fall in a year. Fall prevention discussed.   He is going to schedule appt with GI and oncologist,both he cancelled due to travel.      Davina Howlett G. [name], MD  [facility]. [place] office.

## 2017-10-01 ENCOUNTER — Encounter: Payer: Self-pay | Admitting: Family Medicine

## 2017-10-01 ENCOUNTER — Ambulatory Visit: Payer: Medicare HMO | Admitting: Family Medicine

## 2017-10-01 VITALS — BP 130/84 | HR 89 | Temp 98.1°F | Resp 16 | Ht 67.0 in | Wt 161.1 lb

## 2017-10-01 DIAGNOSIS — M545 Low back pain, unspecified: Secondary | ICD-10-CM

## 2017-10-01 DIAGNOSIS — G8929 Other chronic pain: Secondary | ICD-10-CM

## 2017-10-01 DIAGNOSIS — R2681 Unsteadiness on feet: Secondary | ICD-10-CM

## 2017-10-01 DIAGNOSIS — L570 Actinic keratosis: Secondary | ICD-10-CM

## 2017-10-01 DIAGNOSIS — I10 Essential (primary) hypertension: Secondary | ICD-10-CM | POA: Diagnosis not present

## 2017-10-01 DIAGNOSIS — Z86711 Personal history of pulmonary embolism: Secondary | ICD-10-CM | POA: Diagnosis not present

## 2017-10-01 LAB — BASIC METABOLIC PANEL
BUN: 16 mg/dL (ref 6–23)
CO2: 28 mEq/L (ref 19–32)
Calcium: 9.3 mg/dL (ref 8.4–10.5)
Chloride: 102 mEq/L (ref 96–112)
Creatinine, Ser: 1.14 mg/dL (ref 0.40–1.50)
GFR: 66.28 mL/min (ref >60.00)
Glucose, Bld: 98 mg/dL (ref 70–99)
Potassium: 4.5 mEq/L (ref 3.5–5.1)
SODIUM: 139 meq/L (ref 135–145)

## 2017-10-01 MED ORDER — APIXABAN 5 MG PO TABS: 5.0000 mg | ORAL_TABLET | Freq: Two times a day (BID) | ORAL | 2 refills | Status: DC

## 2017-10-01 MED ORDER — AMLODIPINE BESYLATE 5 MG PO TABS: 5.0000 mg | ORAL_TABLET | Freq: Every day | ORAL | 2 refills | Status: DC

## 2017-10-01 MED ORDER — IRBESARTAN 150 MG PO TABS: 150.0000 mg | ORAL_TABLET | Freq: Every day | ORAL | 2 refills | Status: DC

## 2017-10-01 NOTE — Assessment & Plan Note (Signed)
Otherwise adequately controlled. No changes in current management. Low salt diet to continue. Eye exam recommended annually,he is due F/U in 6 months, before if needed.

## 2017-10-01 NOTE — Assessment & Plan Note (Signed)
Tolerating Eliquis well. Some side effects discussed. Strongly recommend smoking cessation,he is not interested.He tried different treatment unsuccessfully and not interested in trying again.

## 2017-10-01 NOTE — Assessment & Plan Note (Signed)
He has not had a fall in a year. Fall prevention discussed.

## 2017-10-01 NOTE — Assessment & Plan Note (Signed)
Since he does not feel like Zanaflex is helping,recommend discontinue it. If he realizes  medication is helping,he can resume it. Side effects discussed.

## 2017-10-01 NOTE — Patient Instructions (Signed)
A few things to remember from today's visit:   Essential hypertension - Plan: Basic metabolic panel, amLODipine (NORVASC) 5 MG tablet, irbesartan (AVAPRO) 150 MG tablet  Hx of pulmonary embolus - Plan: apixaban (ELIQUIS) 5 MG TABS tablet  Unstable gait  AK (actinic keratosis)   Please be sure medication list is accurate. If a new problem present, please set up appointment sooner than planned today.

## 2017-10-01 NOTE — Assessment & Plan Note (Signed)
After verbal consent lesions were treated with liquid nitrogen x 3. He tolerated procedure well. Post procedure care instructions given. Recommend big hot and sun screen.

## 2017-10-19 ENCOUNTER — Other Ambulatory Visit: Payer: Self-pay | Admitting: Family Medicine

## 2017-10-19 DIAGNOSIS — I10 Essential (primary) hypertension: Secondary | ICD-10-CM

## 2017-10-26 ENCOUNTER — Encounter: Payer: Self-pay | Admitting: Nurse Practitioner

## 2017-11-04 ENCOUNTER — Other Ambulatory Visit: Payer: Self-pay | Admitting: Family Medicine

## 2017-11-04 DIAGNOSIS — K219 Gastro-esophageal reflux disease without esophagitis: Secondary | ICD-10-CM

## 2017-11-16 ENCOUNTER — Telehealth: Payer: Self-pay

## 2017-11-16 ENCOUNTER — Encounter: Payer: Self-pay | Admitting: Nurse Practitioner

## 2017-11-16 ENCOUNTER — Ambulatory Visit: Payer: Medicare HMO | Admitting: Nurse Practitioner

## 2017-11-16 ENCOUNTER — Encounter: Payer: Self-pay | Admitting: Internal Medicine

## 2017-11-16 VITALS — BP 138/86 | HR 82 | Ht 68.0 in | Wt 166.5 lb

## 2017-11-16 DIAGNOSIS — Z86010 Personal history of colonic polyps: Secondary | ICD-10-CM | POA: Diagnosis not present

## 2017-11-16 DIAGNOSIS — Z7901 Long term (current) use of anticoagulants: Secondary | ICD-10-CM | POA: Diagnosis not present

## 2017-11-16 NOTE — Progress Notes (Addendum)
P Primary GI:   Silvano Rusk, MD   Chief Complaint:    Needs colonoscopy  IMPRESSION and PLAN:    #5.  77 year old male with history of recurrent, MULTIPLEe colon adenomas, He had numerous polyps removed at time of last colonoscopy due 11/02/2016 (maximum 15 mm . He is due for surveillance colonoscopy. -The risks and benefits of colonoscopy with possible polypectomy were discussed and the patient agrees to proceed.  -We recommended genetic testing given the polyposis. Patient says PCP did order genetic testing and it came back negative    #2. Hx of DVT, on chronic Eliquis.  Hold Eliquis for 2 days before procedure - will instruct when and how to resume after procedure. Patient understands that there is a low but real risk of cardiovascular event such as heart attack, stroke, or embolism /  thrombosis while off blood thinner. The patient consents to proceed. Will communicate by phone or EMR with patient's prescribing provider to confirm that holding Eliquis is reasonable in this case.   #3.  History of COPD, history of lung cancer.  Ongoing tobacco use.  Patient says he has no plans to discontinue tobacco  #4. GERD, asymptomatic on daily PPI.   Agree with Ms. Romelle Muldoon's assessment and plan. Gatha Mayer, MD, [facility]    HPI:     Patient is a 77 yo male with hx of HTN, DVTs, lung cancer, COPD ongoing smoking with no intentions to discontinue. He has a history of recurrent, multiple colon polyps.  He is due for surveillance colonoscopy.  No overt bleeding, no bowel changes.  No abdominal pain.  No unintentional weight loss.  Patient takes Eliquis for history of DVTs.  Patient has no GI or general medical complaints.  Specifically no chest pain, shortness of breath, dizziness, lightheadedness.  No GERD symptoms on daily PPI.   Review of systems:     No chest pain, no SOB, no fevers, no urinary sx   Past Medical History:  Diagnosis Date  . Adenocarcinoma of lung, stage 1 ([place])   .  Arthritis   . Blood clotting disorder ([place])   . Chronic obstructive pulmonary disease (COPD) ([place])    "patient not aware"  . Colon polyps   . Depression   . Diverticulosis   . DVT (deep venous thrombosis) ([place])    Post op  knee surgery  . DVT (deep venous thrombosis) ([place])   . GERD (gastroesophageal reflux disease)   . Histoplasmosis   . Hypercholesteremia   . Hyperlipidemia   . Hypertension   . Lung cancer ([place])   . Personal history of colonic polyps 2008   multiple adenomas 2008-2009 exams with large cecal adenoma  . Pneumonia    in history  . Polyposis coli - attenuated 09/12/2010   2008: adenomas with 9 polyps index and 2.5 cm cecal polyp removed over 3 exams, last 2009 with APC. diverticulosis also. 11/24/2010: 12 polyps removed - adenomas Anticipate routine repeat colonoscopy Spring 2013 Have recommended he see genetics counselor re ? Polyposis 03/27/2016 14 polyps removed max 15 mm   . Pulmonary nodule 06/2006   9 mm right upper obe pulmonary nodule (negative Pet Scan)  . PVD (peripheral vascular disease) ([place])    left leg s/p stent  . Sebaceous cyst    neck  . Seborrheic keratosis   . Sinusitis   . Tobacco abuse   . Tremor     Patient's surgical history, family medical history, social history, medications and allergies  were all reviewed in Epic   Creatinine clearance cannot be calculated (Patient's most recent lab result is older than the maximum 21 days allowed.)  Current Outpatient Medications  Medication Sig Dispense Refill  . amLODipine (NORVASC) 5 MG tablet TAKE 1 TABLET BY MOUTH EVERY DAY 90 tablet 0  . apixaban (ELIQUIS) 5 MG TABS tablet Take 1 tablet (5 mg total) by mouth 2 (two) times daily. 180 tablet 2  . irbesartan (AVAPRO) 150 MG tablet Take 1 tablet (150 mg total) by mouth daily. 90 tablet 2  . omeprazole (PRILOSEC) 20 MG capsule TAKE ONE CAPSULE BY MOUTH TWICE A DAY BEFORE A MEAL (Patient taking differently: daily. TAKE ONE CAPSULE BY MOUTH ONCE A DAY  BEFORE A MEAL) 180 capsule 0  . thiamine (VITAMIN B-1) 100 MG tablet Take 1 tablet (100 mg total) by mouth daily. 90 tablet 2  . tiZANidine (ZANAFLEX) 2 MG tablet TAKE ONE TABLET BY MOUTH EVERY 12 HOURS AS NEEDED MUSCLE SPASMS 60 tablet 1   No current facility-administered medications for this visit.     Physical Exam:     BP 138/86   Pulse 82   Ht 5\' 8"  (1.727 m)   Wt 166 lb 8 oz (75.5 kg)   BMI 25.32 kg/m   GENERAL:  Pleasant thin male in NAD PSYCH: : Cooperative, normal affect EENT:  conjunctiva pink, mucous membranes moist, neck supple without masses CARDIAC:  RRR, no murmur heard, no peripheral edema PULM: Normal respiratory effort, lungs CTA bilaterally, no wheezing ABDOMEN:  Nondistended, soft, nontender. No obvious masses, no hepatomegaly,  normal bowel sounds SKIN:  turgor, no lesions seen Musculoskeletal:  Normal muscle tone, normal strength NEURO: Alert and oriented x 3, no focal neurologic deficits   Tye Savoy , NP 11/16/2017, 11:09 AM

## 2017-11-16 NOTE — Telephone Encounter (Signed)
[facility] Pre-operative Risk Assessment     Request for surgical clearance:     Endoscopy Procedure  What type of surgery is being performed?     11/23/17  When is this surgery scheduled?     11/23/17  What type of clearance is required ?   Pharmacy  Are there any medications that need to be held prior to surgery and how long? HOLD ELIQUIS FOR TWO DAYS  Practice name and name of physician performing surgery?      [facility]/ Dr. Carlean Purl  What is your office phone and fax number?      Phone- [phone]  [phone]  Anesthesia type (None, local, MAC, general) ?       MAC

## 2017-11-16 NOTE — Patient Instructions (Signed)
If you are age 77 or older, your body mass index should be between 23-30. Your Body mass index is 25.32 kg/m. If this is out of the aforementioned range listed, please consider follow up with your Primary Care Provider.  If you are age 42 or younger, your body mass index should be between 19-25. Your Body mass index is 25.32 kg/m. If this is out of the aformentioned range listed, please consider follow up with your Primary Care Provider.   You have been scheduled for a colonoscopy. Please follow written instructions given to you at your visit today.  Please pick up your prep supplies at the pharmacy within the next 1-3 days. If you use inhalers (even only as needed), please bring them with you on the day of your procedure. Your physician has requested that you go to www.startemmi.com and enter the access code given to you at your visit today. This web site gives a general overview about your procedure. However, you should still follow specific instructions given to you by our office regarding your preparation for the procedure.  You will be contacted by our office prior to your procedure for directions on holding your Eliquis.  If you do not hear from our office 1 week prior to your scheduled procedure, please call [phone] to discuss.   Thank you for choosing me and [facility].   Tye Savoy, NP

## 2017-11-17 ENCOUNTER — Telehealth: Payer: Self-pay | Admitting: *Deleted

## 2017-11-17 ENCOUNTER — Telehealth: Payer: Self-pay

## 2017-11-17 NOTE — Telephone Encounter (Signed)
Prior auth for Omeprazole 20mg  capsules sent to [email or web address]-key [id number].

## 2017-11-17 NOTE — Telephone Encounter (Signed)
Please advise as to whether Eliquis can be held two days prior to procedure. Thank you, Peter Congo, RMA

## 2017-11-17 NOTE — Telephone Encounter (Signed)
Is this for my information or I am being asked?  Thanks, BJ

## 2017-11-19 NOTE — Telephone Encounter (Signed)
It is ok to do so.  Thanks, BJ

## 2017-11-22 NOTE — Telephone Encounter (Signed)
Patient advised.  He states his procedure is tomorrow and has held his Eliquis.

## 2017-11-23 ENCOUNTER — Ambulatory Visit (AMBULATORY_SURGERY_CENTER): Payer: Medicare HMO | Admitting: Internal Medicine

## 2017-11-23 ENCOUNTER — Encounter: Payer: Self-pay | Admitting: Internal Medicine

## 2017-11-23 VITALS — BP 122/77 | HR 78 | Temp 98.4°F | Resp 14 | Ht 68.0 in | Wt 166.0 lb

## 2017-11-23 DIAGNOSIS — K5520 Angiodysplasia of colon without hemorrhage: Secondary | ICD-10-CM

## 2017-11-23 DIAGNOSIS — D122 Benign neoplasm of ascending colon: Secondary | ICD-10-CM | POA: Diagnosis not present

## 2017-11-23 DIAGNOSIS — D120 Benign neoplasm of cecum: Secondary | ICD-10-CM

## 2017-11-23 DIAGNOSIS — Z86010 Personal history of colonic polyps: Secondary | ICD-10-CM | POA: Diagnosis not present

## 2017-11-23 DIAGNOSIS — D126 Benign neoplasm of colon, unspecified: Secondary | ICD-10-CM

## 2017-11-23 DIAGNOSIS — J449 Chronic obstructive pulmonary disease, unspecified: Secondary | ICD-10-CM | POA: Diagnosis not present

## 2017-11-23 DIAGNOSIS — D124 Benign neoplasm of descending colon: Secondary | ICD-10-CM

## 2017-11-23 DIAGNOSIS — D123 Benign neoplasm of transverse colon: Secondary | ICD-10-CM

## 2017-11-23 DIAGNOSIS — I2510 Atherosclerotic heart disease of native coronary artery without angina pectoris: Secondary | ICD-10-CM | POA: Diagnosis not present

## 2017-11-23 HISTORY — DX: Angiodysplasia of colon without hemorrhage: K55.20

## 2017-11-23 MED ORDER — SODIUM CHLORIDE 0.9 % IV SOLN: 500.0000 mL | Freq: Once | INTRAVENOUS | Status: DC

## 2017-11-23 NOTE — Patient Instructions (Addendum)
I found and removed 10 polyps.  I saw AVM's in the cecum (beginning of colon). Abnormal surface blood vessels that can leak blood.  I will let you know pathology results and when to have another routine colonoscopy by mail and/or My Chart.  Restart Eliquis tomorrow  I appreciate the opportunity to care for you. Gatha Mayer, MD, FACG  YOU HAD AN ENDOSCOPIC PROCEDURE TODAY AT [facility]:   Refer to the procedure report that was given to you for any specific questions about what was found during the examination.  If the procedure report does not answer your questions, please call your gastroenterologist to clarify.  If you requested that your care partner not be given the details of your procedure findings, then the procedure report has been included in a sealed envelope for you to review at your convenience later.  YOU SHOULD EXPECT: Some feelings of bloating in the abdomen. Passage of more gas than usual.  Walking can help get rid of the air that was put into your GI tract during the procedure and reduce the bloating. If you had a lower endoscopy (such as a colonoscopy or flexible sigmoidoscopy) you may notice spotting of blood in your stool or on the toilet paper. If you underwent a bowel prep for your procedure, you may not have a normal bowel movement for a few days.  Please Note:  You might notice some irritation and congestion in your nose or some drainage.  This is from the oxygen used during your procedure.  There is no need for concern and it should clear up in a day or so.  SYMPTOMS TO REPORT IMMEDIATELY:   Following lower endoscopy (colonoscopy or flexible sigmoidoscopy):  Excessive amounts of blood in the stool  Significant tenderness or worsening of abdominal pains  Swelling of the abdomen that is new, acute  Fever of 100F or higher  For urgent or emergent issues, a gastroenterologist can be reached at any hour by calling [phone].   DIET:   We do recommend a small meal at first, but then you may proceed to your regular diet.  Drink plenty of fluids but you should avoid alcoholic beverages for 24 hours.  ACTIVITY:  You should plan to take it easy for the rest of today and you should NOT DRIVE or use heavy machinery until tomorrow (because of the sedation medicines used during the test).    FOLLOW UP: Our staff will call the number listed on your records the next business day following your procedure to check on you and address any questions or concerns that you may have regarding the information given to you following your procedure. If we do not reach you, we will leave a message.  However, if you are feeling well and you are not experiencing any problems, there is no need to return our call.  We will assume that you have returned to your regular daily activities without incident.  If any biopsies were taken you will be contacted by phone or by letter within the next 1-3 weeks.  Please call us at [phone] if you have not heard about the biopsies in 3 weeks.    SIGNATURES/CONFIDENTIALITY: You and/or your care partner have signed paperwork which will be entered into your electronic medical record.  These signatures attest to the fact that that the information above on your After Visit Summary has been reviewed and is understood.  Full responsibility of the confidentiality of this discharge  information lies with you and/or your care-partner. 

## 2017-11-23 NOTE — Progress Notes (Signed)
Called to room to assist during endoscopic procedure.  Patient ID and intended procedure confirmed with present staff. Received instructions for my participation in the procedure from the performing physician.  

## 2017-11-23 NOTE — Op Note (Signed)
[facility] Patient Name: Timothy Wood Procedure Date: 11/23/2017 1:27 PM MRN: [id number] Endoscopist: Gatha Mayer , MD Age: 77 Referring MD:  Date of Birth: 1940/10/04 Gender: Male Account #: [id number] Procedure:                Colonoscopy Indications:              Surveillance: History of numerous (> 10) adenomas                            on last colonoscopy (< 3 yrs), Last colonoscopy:                            January 2018 Medicines:                Propofol per Anesthesia, Monitored Anesthesia Care Procedure:                Pre-Anesthesia Assessment:                           - Prior to the procedure, a History and Physical                            was performed, and patient medications and                            allergies were reviewed. The patient's tolerance of                            previous anesthesia was also reviewed. The risks                            and benefits of the procedure and the sedation                            options and risks were discussed with the patient.                            All questions were answered, and informed consent                            was obtained. Prior Anticoagulants: The patient has                            taken no previous anticoagulant or antiplatelet                            agents. ASA Grade Assessment: III - A patient with                            severe systemic disease. After reviewing the risks                            and benefits, the patient was deemed in  satisfactory condition to undergo the procedure.                           After obtaining informed consent, the colonoscope                            was passed under direct vision. Throughout the                            procedure, the patient's blood pressure, pulse, and                            oxygen saturations were monitored continuously. The                            Colonoscope was  introduced through the anus and                            advanced to the the cecum, identified by                            appendiceal orifice and ileocecal valve. The                            colonoscopy was performed without difficulty. The                            patient tolerated the procedure well. The quality                            of the bowel preparation was good. The bowel                            preparation used was Miralax. The ileocecal valve,                            appendiceal orifice, and rectum were photographed. Scope In: 1:46:35 PM Scope Out: 2:09:07 PM Scope Withdrawal Time: 0 hours 18 minutes 34 seconds  Total Procedure Duration: 0 hours 22 minutes 32 seconds  Findings:                 The perianal and digital rectal examinations were                            normal. Pertinent negatives include normal prostate                            (size, shape, and consistency).                           Nine sessile polyps were found in the descending                            colon, transverse colon, ascending colon and cecum.  The polyps were 1 to 3 mm in size. These polyps                            were removed with a cold biopsy forceps. Resection                            and retrieval were complete. Verification of                            patient identification for the specimen was done.                            Estimated blood loss was minimal.                           A 4 mm polyp was found in the ascending colon. The                            polyp was sessile. The polyp was removed with a                            cold snare. Resection and retrieval were complete.                            Verification of patient identification for the                            specimen was done. Estimated blood loss was minimal.                           A few small angiodysplastic lesions without                             bleeding were found in the cecum.                           The exam was otherwise without abnormality on                            direct and retroflexion views. Complications:            No immediate complications. Estimated Blood Loss:     Estimated blood loss was minimal. Impression:               - Nine 1 to 3 mm polyps in the descending colon, in                            the transverse colon, in the ascending colon and in                            the cecum, removed with a cold biopsy forceps.  Resected and retrieved.                           - One 4 mm polyp in the ascending colon, removed                            with a cold snare. Resected and retrieved.                           - A few non-bleeding colonic angiodysplastic                            lesions.                           - The examination was otherwise normal on direct                            and retroflexion views.                           - Personal history of colonic polyps. ATTENUATED                            POLYPOSIS Recommendation:           - Patient has a contact number available for                            emergencies. The signs and symptoms of potential                            delayed complications were discussed with the                            patient. Return to normal activities tomorrow.                            Written discharge instructions were provided to the                            patient.                           - Resume previous diet.                           - Continue present medications.                           - Resume Eliquis (apixaban) at prior dose tomorrow.                           - Repeat colonoscopy is recommended for                            surveillance. The colonoscopy date will be  determined after pathology results from today's                            exam become available for  review. Gatha Mayer, MD 11/23/2017 2:26:21 PM This report has been signed electronically.

## 2017-11-24 ENCOUNTER — Telehealth: Payer: Self-pay

## 2017-11-24 NOTE — Telephone Encounter (Signed)
  Follow up Call-  Call back number 11/23/2017 03/27/2016  Post procedure Call Back phone  # [phone]  Permission to leave phone message Yes Yes  Some recent data might be hidden     Patient questions:  Do you have a fever, pain , or abdominal swelling? No. Pain Score  0 *  Have you tolerated food without any problems? Yes.    Have you been able to return to your normal activities? Yes.    Do you have any questions about your discharge instructions: Diet   No. Medications  No. Follow up visit  No.  Do you have questions or concerns about your Care? No.  Actions: * If pain score is 4 or above: No action needed, pain <4.

## 2017-12-10 ENCOUNTER — Encounter: Payer: Self-pay | Admitting: Internal Medicine

## 2017-12-10 DIAGNOSIS — D126 Benign neoplasm of colon, unspecified: Secondary | ICD-10-CM

## 2017-12-10 NOTE — Progress Notes (Signed)
CORRECTION - HE HAS + POLYPOSIS GENE SO RECALL 1 YEAR

## 2017-12-10 NOTE — Progress Notes (Signed)
Looks like 4 precancerous polyps (2 ssp and 2 tub adenomas) but path report has number of fragments reversed  Letter created  Please contact pathology and have them evaluate and edit the report and send the addendeum   Recall 3 years

## 2018-01-14 DIAGNOSIS — Z961 Presence of intraocular lens: Secondary | ICD-10-CM | POA: Diagnosis not present

## 2018-01-14 DIAGNOSIS — H52203 Unspecified astigmatism, bilateral: Secondary | ICD-10-CM | POA: Diagnosis not present

## 2018-04-04 ENCOUNTER — Ambulatory Visit (INDEPENDENT_AMBULATORY_CARE_PROVIDER_SITE_OTHER): Payer: Medicare HMO | Admitting: Family Medicine

## 2018-04-04 ENCOUNTER — Encounter: Payer: Self-pay | Admitting: Family Medicine

## 2018-04-04 VITALS — BP 130/82 | HR 85 | Temp 97.8°F | Resp 16 | Ht 68.0 in | Wt 177.1 lb

## 2018-04-04 DIAGNOSIS — I10 Essential (primary) hypertension: Secondary | ICD-10-CM | POA: Diagnosis not present

## 2018-04-04 DIAGNOSIS — F17200 Nicotine dependence, unspecified, uncomplicated: Secondary | ICD-10-CM

## 2018-04-04 DIAGNOSIS — R69 Illness, unspecified: Secondary | ICD-10-CM | POA: Diagnosis not present

## 2018-04-04 DIAGNOSIS — Z7901 Long term (current) use of anticoagulants: Secondary | ICD-10-CM

## 2018-04-04 DIAGNOSIS — R2681 Unsteadiness on feet: Secondary | ICD-10-CM | POA: Diagnosis not present

## 2018-04-04 DIAGNOSIS — E785 Hyperlipidemia, unspecified: Secondary | ICD-10-CM | POA: Diagnosis not present

## 2018-04-04 DIAGNOSIS — Z86711 Personal history of pulmonary embolism: Secondary | ICD-10-CM | POA: Diagnosis not present

## 2018-04-04 NOTE — Patient Instructions (Addendum)
A few things to remember from today's visit:   Essential hypertension  Unstable gait  Hyperlipidemia, unspecified hyperlipidemia type  Monitor blood pressure at home and let me know if elevated.  I will review cholesterol med you took before and will resume it.  Cholesterol test with next lab work at the oncologist office.   Please be sure medication list is accurate. If a new problem present, please set up appointment sooner than planned today.

## 2018-04-04 NOTE — Progress Notes (Signed)
HPI:   Mr.Pharell L Borsuk is a 78 y.o. male, who is here today for 6 months follow up.   He was last seen on 10/01/17  Since his last OV he has followed with GI.    2 months of wet cough in am when first gets up rattle and allevigng by coughing. He doe snot have problems during the day. Negative for wheezing or dyspnea. No fever,chills,or changes in appetite.   Hypertension:  Currently on Avapro 150 mg daily. He has not been on Amlodipine 5 mg for months,he thinks he may have ran out and did not fill Rx. No side effects reported.  He has not noted unusual headache, visual changes, exertional chest pain, dyspnea,  focal weakness, or edema.  BP readings 176/81 x , rest of BP readings < 140/90.  Lab Results  Component Value Date   CREATININE 1.14 10/01/2017   BUN 16 10/01/2017   NA 139 10/01/2017   K 4.5 10/01/2017   CL 102 10/01/2017   CO2 28 10/01/2017   Still smoking and not interested in smoking cessation.  Unstable gait and tremor: Balance is "the same", denies falls since his last OV. Dizziness in shower when closing eyes,it last seconds and has been stable for   HLD: He was on statin med before,he thinks he ran out when he was out of town a year ago and did not resume. Hx of CAD and PAD.   Lab Results  Component Value Date   CHOL 179 10/15/2015   HDL 42.30 10/15/2015   LDLCALC 114 (H) 10/15/2015   LDLDIRECT 118.8 06/14/2006   TRIG 113.0 10/15/2015   CHOLHDL 4 10/15/2015   Taking Eliquis 5 mg bid, s/p PE x 2. He has not noted nose or gum bleed,blood in stool,melena,or gross hematuria.    Review of Systems  Constitutional: Negative for activity change, appetite change, fatigue and fever.  HENT: Negative for nosebleeds, sore throat and trouble swallowing.   Eyes: Negative for redness and visual disturbance.  Respiratory: Negative for cough, shortness of breath and wheezing.   Cardiovascular: Negative for chest pain, palpitations and leg  swelling.  Gastrointestinal: Negative for abdominal pain, nausea and vomiting.  Genitourinary: Negative for decreased urine volume, dysuria and hematuria.  Musculoskeletal: Positive for gait problem. Negative for myalgias.  Neurological: Positive for dizziness. Negative for syncope, weakness and headaches.    Current Outpatient Medications on File Prior to Visit  Medication Sig Dispense Refill  . omeprazole (PRILOSEC) 20 MG capsule TAKE ONE CAPSULE BY MOUTH TWICE A DAY BEFORE A MEAL (Patient taking differently: daily. TAKE ONE CAPSULE BY MOUTH ONCE A DAY BEFORE A MEAL) 180 capsule 0  . thiamine (VITAMIN B-1) 100 MG tablet Take 1 tablet (100 mg total) by mouth daily. 90 tablet 2  . tiZANidine (ZANAFLEX) 2 MG tablet TAKE ONE TABLET BY MOUTH EVERY 12 HOURS AS NEEDED MUSCLE SPASMS 60 tablet 1   No current facility-administered medications on file prior to visit.      Past Medical History:  Diagnosis Date  . Adenocarcinoma of lung, stage 1 ([facility])   . Anemia   . Angiodysplasia of cecum 11/23/2017   Multiple 1-2 mm  . Arthritis    back possibly  . Blood clotting disorder ([facility])   . Cataract    removed bilaterally   . Chronic obstructive pulmonary disease (COPD) ([facility])    "patient not aware"  . Clotting disorder ([facility])    PE LUNG, dvt leg   .  Colon polyps   . Depression   . Diverticulosis   . DVT (deep venous thrombosis) ([facility])    Post op  knee surgery  . DVT (deep venous thrombosis) ([facility])   . GERD (gastroesophageal reflux disease)   . Histoplasmosis   . Histoplasmosis with pneumonia ([facility]) 04/01/2015  . Hypercholesteremia   . Hyperlipidemia   . Hypertension   . Laceration of head 08/05/2015  . Lumbar compression fracture ([facility])   . Lung cancer ([facility])   . Personal history of colonic polyps 2008   multiple adenomas 2008-2009 exams with large cecal adenoma  . Pneumonia    in history  . Polyposis coli - attenuated 09/12/2010   2008: adenomas with 9 polyps index and 2.5 cm cecal polyp  removed over 3 exams, last 2009 with APC. diverticulosis also. 11/24/2010: 12 polyps removed - adenomas Anticipate routine repeat colonoscopy Spring 2013 Have recommended he see genetics counselor re ? Polyposis 03/27/2016 14 polyps removed max 15 mm   . Pulmonary nodule 06/2006   9 mm right upper obe pulmonary nodule (negative Pet Scan)  . PVD (peripheral vascular disease) ([facility])    left leg s/p stent  . Sebaceous cyst    neck  . Seborrheic keratosis   . Seizures ([facility])    4-5 yrs ago had seizures when in ICU- none since this time   . Sinusitis   . Tobacco abuse   . Tremor    Allergies  Allergen Reactions  . Codeine Anaphylaxis  . Hydrocodone Itching    Social History   Socioeconomic History  . Marital status: Widowed    Spouse name: Not on file  . Number of children: 2  . Years of education: Not on file  . Highest education level: Not on file  Occupational History  . Occupation: Retired    Fish farm manager: RETIRED  Social Needs  . Financial resource strain: Not on file  . Food insecurity:    Worry: Not on file    Inability: Not on file  . Transportation needs:    Medical: Not on file    Non-medical: Not on file  Tobacco Use  . Smoking status: Current Every Day Smoker    Packs/day: 1.00    Years: 60.00    Pack years: 60.00    Types: Cigarettes  . Smokeless tobacco: Never Used  Substance and Sexual Activity  . Alcohol use: Yes    Alcohol/week: 0.0 standard drinks    Comment: quit drinking after last hospitalization- may 2017  . Drug use: No  . Sexual activity: Not Currently  Lifestyle  . Physical activity:    Days per week: Not on file    Minutes per session: Not on file  . Stress: Not on file  Relationships  . Social connections:    Talks on phone: Not on file    Gets together: Not on file    Attends religious service: Not on file    Active member of club or organization: Not on file    Attends meetings of clubs or organizations: Not on file    Relationship  status: Not on file  Other Topics Concern  . Not on file  Social History Narrative       Married for over 50 years.  Wife died from lung cancer.     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     Occasional alcohol.      Tobacco use - over 50 pack years.  Father died at age 78 of suicide.   Michele Ellis-daughter emergenc   y contact    Vitals:   04/04/18 0826  BP: 130/82  Pulse: 85  Resp: 16  Temp: 97.8 F (36.6 C)  SpO2: 94%   Body mass index is 26.93 kg/m.   Physical Exam  Nursing note and vitals reviewed. Constitutional: He is oriented to person, place, and time. He appears well-developed and well-nourished. No distress.  HENT:  Head: Normocephalic and atraumatic.  Mouth/Throat: Oropharynx is clear and moist and mucous membranes are normal. He has dentures.  Eyes: Pupils are equal, round, and reactive to light. Conjunctivae are normal.  Cardiovascular: Normal rate and regular rhythm.  No murmur heard. Respiratory: Effort normal and breath sounds normal. No respiratory distress.  GI: Soft. He exhibits no mass. There is no hepatomegaly. There is no abdominal tenderness.  Musculoskeletal:        General: No edema.  Lymphadenopathy:    He has no cervical adenopathy.  Neurological: He is alert and oriented to person, place, and time. He has normal strength. No cranial nerve deficit. Gait abnormal.  Skin: Skin is warm. No rash noted. No erythema.  Psychiatric: He has a normal mood and affect.  Well groomed, good eye contact.      ASSESSMENT AND PLAN:   Mr. GER RINGENBERG was seen today for 6 months follow-up.  Diagnoses and all orders for this visit:  Essential hypertension Adequately controlled today. No changes in Avapro 150 mg daily. Monitor BP at home and if > 140/90 persistently he was instructed to let me know. Low salt diet. Periodic eye exam.  Unstable gait Fall precautions discussed. He is not interested in cane for  assistance.  Hyperlipidemia, unspecified hyperlipidemia type He was on Crestor before,not sure why he discontinued medication. Crestor 20 mg sent to his pharmacy to resume. Low fat diet also recommended. FLP with next lab work at his oncologist's office.  -     Lipid panel; Future  TOBACCO ABUSE Adverse effects of tobacco use and benefits of smoking cessation. He is not interested in smoking cessation.  Lung auscultation today negative. Cough most likely related to COPD/tobacco. He prefers not to have imaging today,has a chest CT appt in a week. Instructed about warning signs.  Chronic anticoagulation No changes in Eliquis 5 mg bid. Side effects discussed.     Return in about 6 months (around 10/03/2018) for CPE. Needs Medicare visit.[name] Kitchen      Jaila Schellhorn G. [name], MD  [facility]. [place] office.

## 2018-04-07 ENCOUNTER — Encounter: Payer: Self-pay | Admitting: Family Medicine

## 2018-04-07 MED ORDER — APIXABAN 5 MG PO TABS
5.0000 mg | ORAL_TABLET | Freq: Two times a day (BID) | ORAL | 2 refills | Status: DC
Start: 2018-04-07 — End: 2019-04-18

## 2018-04-07 MED ORDER — IRBESARTAN 150 MG PO TABS
150.0000 mg | ORAL_TABLET | Freq: Every day | ORAL | 2 refills | Status: DC
Start: 2018-04-07 — End: 2018-11-28

## 2018-04-07 MED ORDER — ROSUVASTATIN CALCIUM 20 MG PO TABS: 20.0000 mg | ORAL_TABLET | Freq: Every day | ORAL | 3 refills | Status: DC

## 2018-04-18 ENCOUNTER — Other Ambulatory Visit: Payer: Self-pay | Admitting: Family Medicine

## 2018-04-18 ENCOUNTER — Inpatient Hospital Stay: Payer: Medicare HMO | Attending: Internal Medicine

## 2018-04-18 DIAGNOSIS — Z7901 Long term (current) use of anticoagulants: Secondary | ICD-10-CM | POA: Insufficient documentation

## 2018-04-18 DIAGNOSIS — Z86718 Personal history of other venous thrombosis and embolism: Secondary | ICD-10-CM | POA: Diagnosis not present

## 2018-04-18 DIAGNOSIS — G8929 Other chronic pain: Secondary | ICD-10-CM | POA: Insufficient documentation

## 2018-04-18 DIAGNOSIS — K219 Gastro-esophageal reflux disease without esophagitis: Secondary | ICD-10-CM | POA: Insufficient documentation

## 2018-04-18 DIAGNOSIS — Z85118 Personal history of other malignant neoplasm of bronchus and lung: Secondary | ICD-10-CM | POA: Diagnosis not present

## 2018-04-18 DIAGNOSIS — M549 Dorsalgia, unspecified: Secondary | ICD-10-CM | POA: Insufficient documentation

## 2018-04-18 DIAGNOSIS — E785 Hyperlipidemia, unspecified: Secondary | ICD-10-CM | POA: Diagnosis not present

## 2018-04-18 DIAGNOSIS — C3490 Malignant neoplasm of unspecified part of unspecified bronchus or lung: Secondary | ICD-10-CM

## 2018-04-18 DIAGNOSIS — I10 Essential (primary) hypertension: Secondary | ICD-10-CM | POA: Diagnosis not present

## 2018-04-18 DIAGNOSIS — Z79899 Other long term (current) drug therapy: Secondary | ICD-10-CM | POA: Insufficient documentation

## 2018-04-18 LAB — CBC WITH DIFFERENTIAL (CANCER CENTER ONLY)
Abs Immature Granulocytes: 0.02 10*3/uL (ref 0.00–0.07)
Basophils Absolute: 0 10*3/uL (ref 0.0–0.1)
Basophils Relative: 1 %
EOS ABS: 0.2 10*3/uL (ref 0.0–0.5)
EOS PCT: 3 %
HCT: 46.4 % (ref 39.0–52.0)
Hemoglobin: 15.7 g/dL (ref 13.0–17.0)
Immature Granulocytes: 0 %
LYMPHS ABS: 1.9 10*3/uL (ref 0.7–4.0)
Lymphocytes Relative: 33 %
MCH: 32.6 pg (ref 26.0–34.0)
MCHC: 33.8 g/dL (ref 30.0–36.0)
MCV: 96.3 fL (ref 80.0–100.0)
MONOS PCT: 13 %
Monocytes Absolute: 0.7 10*3/uL (ref 0.1–1.0)
NRBC: 0 % (ref 0.0–0.2)
Neutro Abs: 2.9 10*3/uL (ref 1.7–7.7)
Neutrophils Relative %: 50 %
PLATELETS: 175 10*3/uL (ref 150–400)
RBC: 4.82 MIL/uL (ref 4.22–5.81)
RDW: 12.2 % (ref 11.5–15.5)
WBC Count: 5.7 10*3/uL (ref 4.0–10.5)

## 2018-04-18 LAB — CMP (CANCER CENTER ONLY)
ALBUMIN: 4.1 g/dL (ref 3.5–5.0)
ALT: 30 U/L (ref 0–44)
AST: 23 U/L (ref 15–41)
Alkaline Phosphatase: 77 U/L (ref 38–126)
Anion gap: 9 (ref 5–15)
BUN: 10 mg/dL (ref 8–23)
CHLORIDE: 107 mmol/L (ref 98–111)
CO2: 27 mmol/L (ref 22–32)
Calcium: 8.9 mg/dL (ref 8.9–10.3)
Creatinine: 0.91 mg/dL (ref 0.61–1.24)
GFR, Est AFR Am: >60 mL/min (ref >60)
GFR, Estimated: >60 mL/min (ref >60)
GLUCOSE: 99 mg/dL (ref 70–99)
POTASSIUM: 3.8 mmol/L (ref 3.5–5.1)
Sodium: 143 mmol/L (ref 135–145)
Total Bilirubin: 0.7 mg/dL (ref 0.3–1.2)
Total Protein: 6.7 g/dL (ref 6.5–8.1)

## 2018-04-20 ENCOUNTER — Inpatient Hospital Stay (HOSPITAL_BASED_OUTPATIENT_CLINIC_OR_DEPARTMENT_OTHER): Payer: Medicare HMO | Admitting: Internal Medicine

## 2018-04-20 ENCOUNTER — Other Ambulatory Visit: Payer: Self-pay | Admitting: Medical Oncology

## 2018-04-20 ENCOUNTER — Encounter: Payer: Self-pay | Admitting: Internal Medicine

## 2018-04-20 VITALS — BP 178/98 | HR 97 | Temp 98.4°F | Resp 20 | Ht 68.0 in | Wt 177.3 lb

## 2018-04-20 DIAGNOSIS — K219 Gastro-esophageal reflux disease without esophagitis: Secondary | ICD-10-CM | POA: Diagnosis not present

## 2018-04-20 DIAGNOSIS — C3491 Malignant neoplasm of unspecified part of right bronchus or lung: Secondary | ICD-10-CM

## 2018-04-20 DIAGNOSIS — I10 Essential (primary) hypertension: Secondary | ICD-10-CM

## 2018-04-20 DIAGNOSIS — Z85118 Personal history of other malignant neoplasm of bronchus and lung: Secondary | ICD-10-CM

## 2018-04-20 DIAGNOSIS — M549 Dorsalgia, unspecified: Secondary | ICD-10-CM

## 2018-04-20 DIAGNOSIS — Z86718 Personal history of other venous thrombosis and embolism: Secondary | ICD-10-CM | POA: Diagnosis not present

## 2018-04-20 DIAGNOSIS — G8929 Other chronic pain: Secondary | ICD-10-CM | POA: Diagnosis not present

## 2018-04-20 DIAGNOSIS — Z7901 Long term (current) use of anticoagulants: Secondary | ICD-10-CM | POA: Diagnosis not present

## 2018-04-20 DIAGNOSIS — C3490 Malignant neoplasm of unspecified part of unspecified bronchus or lung: Secondary | ICD-10-CM

## 2018-04-20 DIAGNOSIS — Z79899 Other long term (current) drug therapy: Secondary | ICD-10-CM | POA: Diagnosis not present

## 2018-04-20 NOTE — Progress Notes (Signed)
[facility] Telephone:([phone]) [phone]   Fax:(336) [phone]  OFFICE PROGRESS NOTE  [name], [name], MD [place] [address]  DIAGNOSIS: Stage IA (T1a, N0, M0) non-small cell lung cancer, adenocarcinoma  diagnosed in April 2016.  PRIOR THERAPY: Status post right VATS with wedge resection of the right middle lobe as well as wedge resection of the right upper lobe and pleural biopsy under the care of [name] on 01/24/2015.  CURRENT THERAPY: Observation.  INTERVAL HISTORY: Timothy Wood 78 y.o. male returns to the clinic today for annual follow-up visit.  The patient is feeling fine today with no concerning complaints except for intermittent back pain.  He denied having any chest pain, shortness of breath, cough or hemoptysis.  He denied having any fever or chills.  He has no nausea, vomiting, diarrhea or constipation.  He denied having any headache or visual changes.  The patient was supposed to have repeat CT scan of the chest performed before this visit but unfortunately it was not scheduled as ordered he is here today for evaluation and repeat blood work.  MEDICAL HISTORY: Past Medical History:  Diagnosis Date  . Adenocarcinoma of lung, stage 1 ([facility])   . Anemia   . Angiodysplasia of cecum 11/23/2017   Multiple 1-2 mm  . Arthritis    back possibly  . Blood clotting disorder ([facility])   . Cataract    removed bilaterally   . Chronic obstructive pulmonary disease (COPD) ([facility])    "patient not aware"  . Clotting disorder ([facility])    PE LUNG, dvt leg   . Colon polyps   . Depression   . Diverticulosis   . DVT (deep venous thrombosis) ([facility])    Post op  knee surgery  . DVT (deep venous thrombosis) ([facility])   . GERD (gastroesophageal reflux disease)   . Histoplasmosis   . Histoplasmosis with pneumonia ([facility]) 04/01/2015  . Hypercholesteremia   . Hyperlipidemia   . Hypertension   . Laceration of head 08/05/2015  . Lumbar compression fracture  ([facility])   . Lung cancer ([facility])   . Personal history of colonic polyps 2008   multiple adenomas 2008-2009 exams with large cecal adenoma  . Pneumonia    in history  . Polyposis coli - attenuated 09/12/2010   2008: adenomas with 9 polyps index and 2.5 cm cecal polyp removed over 3 exams, last 2009 with APC. diverticulosis also. 11/24/2010: 12 polyps removed - adenomas Anticipate routine repeat colonoscopy Spring 2013 Have recommended he see genetics counselor re ? Polyposis 03/27/2016 14 polyps removed max 15 mm   . Pulmonary nodule 06/2006   9 mm right upper obe pulmonary nodule (negative Pet Scan)  . PVD (peripheral vascular disease) ([facility])    left leg s/p stent  . Sebaceous cyst    neck  . Seborrheic keratosis   . Seizures ([facility])    4-5 yrs ago had seizures when in ICU- none since this time   . Sinusitis   . Tobacco abuse   . Tremor     ALLERGIES:  is allergic to codeine and hydrocodone.  MEDICATIONS:  Current Outpatient Medications  Medication Sig Dispense Refill  . apixaban (ELIQUIS) 5 MG TABS tablet Take 1 tablet (5 mg total) by mouth 2 (two) times daily. 180 tablet 2  . irbesartan (AVAPRO) 150 MG tablet Take 1 tablet (150 mg total) by mouth daily. 90 tablet 2  . omeprazole (PRILOSEC) 20 MG capsule TAKE ONE  CAPSULE BY MOUTH TWICE A DAY BEFORE A MEAL (Patient taking differently: daily. TAKE ONE CAPSULE BY MOUTH ONCE A DAY BEFORE A MEAL) 180 capsule 0  . rosuvastatin (CRESTOR) 20 MG tablet Take 1 tablet (20 mg total) by mouth daily. 90 tablet 3  . thiamine (VITAMIN B-1) 100 MG tablet Take 1 tablet (100 mg total) by mouth daily. 90 tablet 2  . tiZANidine (ZANAFLEX) 2 MG tablet TAKE ONE TABLET BY MOUTH EVERY 12 HOURS AS NEEDED MUSCLE SPASMS 60 tablet 1   No current facility-administered medications for this visit.     SURGICAL HISTORY:  Past Surgical History:  Procedure Laterality Date  . ANGIOPLASTY / STENTING ILIAC  08/2008   Dr. Trula Slade  . BACK SURGERY  1975, 1978   x2 lumbar  fusion over 30 years ago  . BACK SURGERY    . COLONOSCOPY  05/11/2006; 07/27/2006;05/05/2007, 11/24/2010   adenomas with 9 polyps index and 2.5 cm cecal polyp removed over 3 exams, last 2009 with APC. diverticulosis also.  2012: 12 adenomas removed, largest 8-10 mm, diverticulosis  . CORONARY ANGIOPLASTY WITH STENT PLACEMENT    . DESCENDING AORTIC ANEURYSM REPAIR W/ STENT  04/2010   Dr. Trula Slade  . ESOPHAGOGASTRODUODENOSCOPY  05/05/2007   GERD - one erosion  . EYE SURGERY Bilateral    cataract  . KNEE SURGERY  2002   left- cartilage   . LUNG BIOPSY    . PERCUTANEOUS PINNING Left 06/24/2012   Procedure: Repair Complex Laceration Left Thumb with percutaneous pinning.;  Surgeon: Dennie Bible, MD;  Location: [facility];  Service: Plastics;  Laterality: Left;  . THUMB AMPUTATION Left    pinning and index finger  . TONSILLECTOMY  1956  . TONSILLECTOMY    . UPPER GASTROINTESTINAL ENDOSCOPY    . VIDEO ASSISTED THORACOSCOPY (VATS)/WEDGE RESECTION Right 01/24/2015   Procedure: RIGHT VIDEO ASSISTED THORACOSCOPY (VATS)/WEDGE RESECTION;  Surgeon: [name], MD;  Location: [facility];  Service: Thoracic;  Laterality: Right;  . vocal cord polyps removed      REVIEW OF SYSTEMS:  A comprehensive review of systems was negative except for: Musculoskeletal: positive for back pain   PHYSICAL EXAMINATION: General appearance: alert, cooperative and no distress Head: Normocephalic, without obvious abnormality, atraumatic Neck: no adenopathy, no JVD, supple, symmetrical, trachea midline and thyroid not enlarged, symmetric, no tenderness/mass/nodules Lymph nodes: Cervical, supraclavicular, and axillary nodes normal. Resp: clear to auscultation bilaterally Back: symmetric, no curvature. ROM normal. No CVA tenderness. Cardio: regular rate and rhythm, S1, S2 normal, no murmur, click, rub or gallop GI: soft, non-tender; bowel sounds normal; no masses,  no organomegaly Extremities: extremities normal, atraumatic,  no cyanosis or edema  ECOG PERFORMANCE STATUS: 0 - Asymptomatic  Blood pressure (!) 178/98, pulse 97, temperature 98.4 F (36.9 C), temperature source Oral, resp. rate 20, height 5\' 8"  (1.727 m), weight 177 lb 4.8 oz (80.4 kg), SpO2 99 %.  LABORATORY DATA: Lab Results  Component Value Date   WBC 5.7 04/18/2018   HGB 15.7 04/18/2018   HCT 46.4 04/18/2018   MCV 96.3 04/18/2018   PLT 175 04/18/2018      Chemistry      Component Value Date/Time   NA 143 04/18/2018 0855   NA 143 10/12/2016 0958   K 3.8 04/18/2018 0855   K 3.7 10/12/2016 0958   CL 107 04/18/2018 0855   CO2 27 04/18/2018 0855   CO2 26 10/12/2016 0958   BUN 10 04/18/2018 0855   BUN 17.8 10/12/2016 0958  CREATININE 0.91 04/18/2018 0855   CREATININE 1.2 10/12/2016 0958      Component Value Date/Time   CALCIUM 8.9 04/18/2018 0855   CALCIUM 9.2 10/12/2016 0958   ALKPHOS 77 04/18/2018 0855   ALKPHOS 92 10/12/2016 0958   AST 23 04/18/2018 0855   AST 16 10/12/2016 0958   ALT 30 04/18/2018 0855   ALT 16 10/12/2016 0958   BILITOT 0.7 04/18/2018 0855   BILITOT 0.76 10/12/2016 0958       RADIOGRAPHIC STUDIES: No results found.  ASSESSMENT AND PLAN:  This is a very pleasant 78 years old white male with stage IA non-small cell lung cancer diagnosed in April 2016 and has been observation since that time. The patient is feeling fine today with no concerning complaints except for his chronic back pain. He was supposed to have repeat CT scan of the chest performed before this visit but it was not scheduled.  I will arrange for the patient to have his a scan done in the next few days. If the scan showed no concerning findings for disease recurrence or progression, I will see him back for follow-up visit in 1 year with repeat CT scan of the chest. He was advised to call immediately if he has any concerning symptoms in the interval. The patient voices understanding of current disease status and treatment options and is  in agreement with the current care plan. All questions were answered. The patient knows to call the clinic with any problems, questions or concerns. We can certainly see the patient much sooner if necessary. I spent 10 minutes counseling the patient face to face. The total time spent in the appointment was 15 minutes.  Disclaimer: This note was dictated with voice recognition software. Similar sounding words can inadvertently be transcribed and may not be corrected upon review.

## 2018-04-21 ENCOUNTER — Telehealth: Payer: Self-pay | Admitting: Internal Medicine

## 2018-04-21 LAB — LIPID PANEL W/O CHOL/HDL RATIO
CHOLESTEROL TOTAL: 142 mg/dL (ref 100–199)
HDL: 36 mg/dL — AB (ref >39)
LDL CALC: 63 mg/dL (ref 0–99)
Triglycerides: 213 mg/dL — ABNORMAL HIGH (ref 0–149)
VLDL CHOLESTEROL CAL: 43 mg/dL — AB (ref 5–40)

## 2018-04-21 NOTE — Telephone Encounter (Signed)
Scheduled appt per 2/5 los - pt to get an updated letter in the mail.

## 2018-04-22 ENCOUNTER — Ambulatory Visit (HOSPITAL_COMMUNITY): Admission: RE | Admit: 2018-04-22 | Payer: Medicare HMO | Source: Ambulatory Visit

## 2018-04-22 ENCOUNTER — Encounter (HOSPITAL_COMMUNITY): Payer: Self-pay

## 2018-04-22 ENCOUNTER — Ambulatory Visit (HOSPITAL_COMMUNITY)
Admission: RE | Admit: 2018-04-22 | Discharge: 2018-04-22 | Disposition: A | Discharge status: Home / Self Care | Payer: Medicare HMO | Source: Ambulatory Visit | Attending: Internal Medicine | Admitting: Internal Medicine

## 2018-04-22 DIAGNOSIS — C3491 Malignant neoplasm of unspecified part of right bronchus or lung: Secondary | ICD-10-CM | POA: Diagnosis not present

## 2018-04-22 MED ORDER — IOHEXOL 300 MG/ML  SOLN
75.0000 mL | Freq: Once | INTRAMUSCULAR | Status: AC | PRN
  Administered 2018-04-22: 75 mL via INTRAVENOUS

## 2018-04-22 MED ORDER — SODIUM CHLORIDE (PF) 0.9 % IJ SOLN
INTRAMUSCULAR | Status: AC
  Filled 2018-04-22: qty 50

## 2018-05-13 ENCOUNTER — Telehealth: Payer: Self-pay | Admitting: Internal Medicine

## 2018-05-13 NOTE — Telephone Encounter (Signed)
Called patient per VM scheduling log, patient wanted to know did he need a f/u since he didn't get his CT scan till after his MD visit.  MD stated his CT was good and will see the patient in a year.

## 2018-05-17 ENCOUNTER — Other Ambulatory Visit: Payer: Self-pay | Admitting: Family Medicine

## 2018-05-17 DIAGNOSIS — K219 Gastro-esophageal reflux disease without esophagitis: Secondary | ICD-10-CM

## 2018-05-20 ENCOUNTER — Telehealth: Payer: Self-pay | Admitting: *Deleted

## 2018-05-20 NOTE — Telephone Encounter (Signed)
Prior auth for Omeprazole 20mg  capsules sent to [email or web address]-key [id number].

## 2018-05-26 DIAGNOSIS — J449 Chronic obstructive pulmonary disease, unspecified: Secondary | ICD-10-CM | POA: Diagnosis not present

## 2018-05-26 DIAGNOSIS — Z008 Encounter for other general examination: Secondary | ICD-10-CM | POA: Diagnosis not present

## 2018-05-26 DIAGNOSIS — Z7901 Long term (current) use of anticoagulants: Secondary | ICD-10-CM | POA: Diagnosis not present

## 2018-05-26 DIAGNOSIS — I739 Peripheral vascular disease, unspecified: Secondary | ICD-10-CM | POA: Diagnosis not present

## 2018-05-26 DIAGNOSIS — M1990 Unspecified osteoarthritis, unspecified site: Secondary | ICD-10-CM | POA: Diagnosis not present

## 2018-05-26 DIAGNOSIS — E785 Hyperlipidemia, unspecified: Secondary | ICD-10-CM | POA: Diagnosis not present

## 2018-05-26 DIAGNOSIS — G8929 Other chronic pain: Secondary | ICD-10-CM | POA: Diagnosis not present

## 2018-05-26 DIAGNOSIS — R69 Illness, unspecified: Secondary | ICD-10-CM | POA: Diagnosis not present

## 2018-05-26 DIAGNOSIS — K219 Gastro-esophageal reflux disease without esophagitis: Secondary | ICD-10-CM | POA: Diagnosis not present

## 2018-05-26 DIAGNOSIS — I10 Essential (primary) hypertension: Secondary | ICD-10-CM | POA: Diagnosis not present

## 2018-08-02 ENCOUNTER — Ambulatory Visit (INDEPENDENT_AMBULATORY_CARE_PROVIDER_SITE_OTHER): Payer: Medicare HMO

## 2018-08-02 ENCOUNTER — Ambulatory Visit (INDEPENDENT_AMBULATORY_CARE_PROVIDER_SITE_OTHER): Payer: Medicare HMO | Admitting: Internal Medicine

## 2018-08-02 ENCOUNTER — Encounter: Payer: Self-pay | Admitting: Internal Medicine

## 2018-08-02 VITALS — BP 140/80 | HR 117 | Temp 99.1°F

## 2018-08-02 DIAGNOSIS — Z7289 Other problems related to lifestyle: Secondary | ICD-10-CM

## 2018-08-02 DIAGNOSIS — R2681 Unsteadiness on feet: Secondary | ICD-10-CM

## 2018-08-02 DIAGNOSIS — J9811 Atelectasis: Secondary | ICD-10-CM | POA: Diagnosis not present

## 2018-08-02 DIAGNOSIS — Z7901 Long term (current) use of anticoagulants: Secondary | ICD-10-CM | POA: Diagnosis not present

## 2018-08-02 DIAGNOSIS — Z86711 Personal history of pulmonary embolism: Secondary | ICD-10-CM | POA: Diagnosis not present

## 2018-08-02 DIAGNOSIS — R0782 Intercostal pain: Secondary | ICD-10-CM | POA: Diagnosis not present

## 2018-08-02 DIAGNOSIS — Z720 Tobacco use: Secondary | ICD-10-CM

## 2018-08-02 DIAGNOSIS — R0781 Pleurodynia: Secondary | ICD-10-CM | POA: Diagnosis not present

## 2018-08-02 DIAGNOSIS — W19XXXA Unspecified fall, initial encounter: Secondary | ICD-10-CM | POA: Diagnosis not present

## 2018-08-02 DIAGNOSIS — S2231XA Fracture of one rib, right side, initial encounter for closed fracture: Secondary | ICD-10-CM | POA: Diagnosis not present

## 2018-08-02 DIAGNOSIS — J90 Pleural effusion, not elsewhere classified: Secondary | ICD-10-CM | POA: Diagnosis not present

## 2018-08-02 NOTE — Patient Instructions (Addendum)
Use cold ice other     To rib area and splint    When taking deep breath  Can take  Weeks for ribs to heal.  BUT if you have shortness of breath worsending contatct medical team and or seek emergency care  .  Very important to avoid falls since you are on anticoagulation.   And can bleed easily .  If you are getting shortness of breath   Increasing you need to seek care in the ED.    Take tylenol 500 mg twice  To 3 x per day a day .  Pain med's can  Make change of falling worse and you  Can not take  With alcohol  As this is risky for falling and stop breathing .    Fall Prevention in the Home, Adult Falls can cause injuries and can affect people from all age groups. There are many simple things that you can do to make your home safe and to help prevent falls. Ask for help when making these changes, if needed. What actions can I take to prevent falls? General instructions  Use good lighting in all rooms. Replace any light bulbs that burn out.  Turn on lights if it is dark. Use night-lights.  Place frequently used items in easy-to-reach places. Lower the shelves around your home if necessary.  Set up furniture so that there are clear paths around it. Avoid moving your furniture around.  Remove throw rugs and other tripping hazards from the floor.  Avoid walking on wet floors.  Fix any uneven floor surfaces.  Add color or contrast paint or tape to grab bars and handrails in your home. Place contrasting color strips on the first and last steps of stairways.  When you use a stepladder, make sure that it is completely opened and that the sides are firmly locked. Have someone hold the ladder while you are using it. Do not climb a closed stepladder.  Be aware of any and all pets. What can I do in the bathroom?      Keep the floor dry. Immediately clean up any water that spills onto the floor.  Remove soap buildup in the tub or shower on a regular basis.  Use non-skid mats or  decals on the floor of the tub or shower.  Attach bath mats securely with double-sided, non-slip rug tape.  If you need to sit down while you are in the shower, use a plastic, non-slip stool.  Install grab bars by the toilet and in the tub and shower. Do not use towel bars as grab bars. What can I do in the bedroom?  Make sure that a bedside light is easy to reach.  Do not use oversized bedding that drapes onto the floor.  Have a firm chair that has side arms to use for getting dressed. What can I do in the kitchen?  Clean up any spills right away.  If you need to reach for something above you, use a sturdy step stool that has a grab bar.  Keep electrical cables out of the way.  Do not use floor polish or wax that makes floors slippery. If you must use wax, make sure that it is non-skid floor wax. What can I do in the stairways?  Do not leave any items on the stairs.  Make sure that you have a light switch at the top of the stairs and the bottom of the stairs. Have them installed if you do not  have them.  Make sure that there are handrails on both sides of the stairs. Fix handrails that are broken or loose. Make sure that handrails are as long as the stairways.  Install non-slip stair treads on all stairs in your home.  Avoid having throw rugs at the top or bottom of stairways, or secure the rugs with carpet tape to prevent them from moving.  Choose a carpet design that does not hide the edge of steps on the stairway.  Check any carpeting to make sure that it is firmly attached to the stairs. Fix any carpet that is loose or worn. What can I do on the outside of my home?  Use bright outdoor lighting.  Regularly repair the edges of walkways and driveways and fix any cracks.  Remove high doorway thresholds.  Trim any shrubbery on the main path into your home.  Regularly check that handrails are securely fastened and in good repair. Both sides of any steps should have  handrails.  Install guardrails along the edges of any raised decks or porches.  Clear walkways of debris and clutter, including tools and rocks.  Have leaves, snow, and ice cleared regularly.  Use sand or salt on walkways during winter months.  In the garage, clean up any spills right away, including grease or oil spills. What other actions can I take?  Wear closed-toe shoes that fit well and support your feet. Wear shoes that have rubber soles or low heels.  Use mobility aids as needed, such as canes, walkers, scooters, and crutches.  Review your medicines with your health care provider. Some medicines can cause dizziness or changes in blood pressure, which increase your risk of falling. Talk with your health care provider about other ways that you can decrease your risk of falls. This may include working with a physical therapist or trainer to improve your strength, balance, and endurance. Where to find more information  Centers for Disease Control and Prevention, [employer]: WebmailGuide.co.za  [employer] on Aging: BrainJudge.co.uk Contact a health care provider if:  You are afraid of falling at home.  You feel weak, drowsy, or dizzy at home.  You fall at home. Summary  There are many simple things that you can do to make your home safe and to help prevent falls.  Ways to make your home safe include removing tripping hazards and installing grab bars in the bathroom.  Ask for help when making these changes in your home. This information is not intended to replace advice given to you by your health care provider. Make sure you discuss any questions you have with your health care provider. Document Released: 02/20/2002 Document Revised: 10/15/2016 Document Reviewed: 10/15/2016 Elsevier Interactive Patient Education  2019 [employer].

## 2018-08-02 NOTE — Progress Notes (Signed)
Chief Complaint  Patient presents with  . Fall    Pt has night terrors. he fell asleep at his desk and when he woke up he ran. the patient tripped and hit a tv stand with his right side. difficulty moving, unable to do daily activities such as bathing, painful to eat    HPI: Timothy Wood 78 y.o. come in for SDA  With daughter   appt for injury PCP NA  Fell asleep at computer on May 17 (not unusual ) and woke up  suddently and  Tripped on hearth and  Ribs on t v stand.   Then was there 2 hours stayed for hours   Was able to walk Sunday and  Monday am  Had cell pone  inhand and tried to throw it and had worsening pain today   No fever or worsening sob or change in baseline cough    Remote hx of rib fx right when fell getting in or out of boat a few years ago   Tobacco  15  Per day at least since age 3 .  etoh    A pint when drinks  Had stopped and restarted after his wife died  Reports having "night terrors" and thus  Not remembering  ? Is on blood thinner for  Hx of PE  Hx of early lung cancer  Watching and  Surgery removal right  Followed by Dr Darlin Coco   Uncertain lung function but prob copd.   ROS: See pertinent positives and negatives per HPI.  Past Medical History:  Diagnosis Date  . Adenocarcinoma of lung, stage 1 ([place])   . Anemia   . Angiodysplasia of cecum 11/23/2017   Multiple 1-2 mm  . Arthritis    back possibly  . Blood clotting disorder ([place])   . Cataract    removed bilaterally   . Chronic obstructive pulmonary disease (COPD) ([place])    "patient not aware"  . Clotting disorder (HCC)    PE LUNG, dvt leg   . Colon polyps   . Depression   . Diverticulosis   . DVT (deep venous thrombosis) ([place])    Post op  knee surgery  . DVT (deep venous thrombosis) ([place])   . GERD (gastroesophageal reflux disease)   . Histoplasmosis   . Histoplasmosis with pneumonia ([place]) 04/01/2015  . Hypercholesteremia   . Hyperlipidemia   . Hypertension   . Laceration of head 08/05/2015   . Lumbar compression fracture ([place])   . Lung cancer ([place])   . Personal history of colonic polyps 2008   multiple adenomas 2008-2009 exams with large cecal adenoma  . Pneumonia    in history  . Polyposis coli - attenuated 09/12/2010   2008: adenomas with 9 polyps index and 2.5 cm cecal polyp removed over 3 exams, last 2009 with APC. diverticulosis also. 11/24/2010: 12 polyps removed - adenomas Anticipate routine repeat colonoscopy Spring 2013 Have recommended he see genetics counselor re ? Polyposis 03/27/2016 14 polyps removed max 15 mm   . Pulmonary nodule 06/2006   9 mm right upper obe pulmonary nodule (negative Pet Scan)  . PVD (peripheral vascular disease) ([place])    left leg s/p stent  . Sebaceous cyst    neck  . Seborrheic keratosis   . Seizures ([place])    4-5 yrs ago had seizures when in ICU- none since this time   . Sinusitis   . Tobacco abuse   . Tremor     Family History  Problem Relation Age of Onset  . Colon cancer Mother 30       multiple colon polyps  . Deep vein thrombosis Mother   . Cancer Mother 53       uterine cancer  . Uterine cancer Mother   . Lung cancer Mother   . Lung cancer Son        smoked and was a Building control surveyor  . Heart disease Brother   . Diabetes Other   . Heart disease Brother   . Rheum arthritis Daughter   . Rheum arthritis Sister   . Colon polyps Neg Hx     Social History   Socioeconomic History  . Marital status: Widowed    Spouse name: Not on file  . Number of children: 2  . Years of education: Not on file  . Highest education level: Not on file  Occupational History  . Occupation: Retired    Fish farm manager: RETIRED  Social Needs  . Financial resource strain: Not on file  . Food insecurity:    Worry: Not on file    Inability: Not on file  . Transportation needs:    Medical: Not on file    Non-medical: Not on file  Tobacco Use  . Smoking status: Current Every Day Smoker    Packs/day: 1.00    Years: 60.00    Pack years: 60.00    Types:  Cigarettes  . Smokeless tobacco: Never Used  Substance and Sexual Activity  . Alcohol use: Yes    Alcohol/week: 0.0 standard drinks    Comment: quit drinking after last hospitalization- may 2017  . Drug use: No  . Sexual activity: Not Currently  Lifestyle  . Physical activity:    Days per week: Not on file    Minutes per session: Not on file  . Stress: Not on file  Relationships  . Social connections:    Talks on phone: Not on file    Gets together: Not on file    Attends religious service: Not on file    Active member of club or organization: Not on file    Attends meetings of clubs or organizations: Not on file    Relationship status: Not on file  Other Topics Concern  . Not on file  Social History Narrative       Married for over 50 years.  Wife died from lung cancer.     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     Occasional alcohol.      Tobacco use - over 50 pack years.   Father died at age 41 of suicide.   Selinda Eon Ellis-daughter emergenc   y contact    Outpatient Medications Prior to Visit  Medication Sig Dispense Refill  . apixaban (ELIQUIS) 5 MG TABS tablet Take 1 tablet (5 mg total) by mouth 2 (two) times daily. 180 tablet 2  . irbesartan (AVAPRO) 150 MG tablet Take 1 tablet (150 mg total) by mouth daily. 90 tablet 2  . omeprazole (PRILOSEC) 20 MG capsule TAKE ONE CAPSULE BY MOUTH TWICE A DAY BEFORE A MEAL 180 capsule 0  . rosuvastatin (CRESTOR) 20 MG tablet Take 1 tablet (20 mg total) by mouth daily. 90 tablet 3  . thiamine (VITAMIN B-1) 100 MG tablet Take 1 tablet (100 mg total) by mouth daily. 90 tablet 2  . tiZANidine (ZANAFLEX) 2 MG tablet TAKE ONE TABLET BY MOUTH EVERY 12 HOURS AS NEEDED MUSCLE SPASMS 60 tablet 1  No facility-administered medications prior to visit.      EXAM:  BP 140/80 (BP Location: Left Arm, Patient Position: Sitting, Cuff Size: Normal)   Pulse (!) 117   Temp 99.1 F (37.3 C) (Oral)   SpO2 91%    There is no height or weight on file to calculate BMI.  GENERAL: vitals reviewed and listed above, alert, oriented, appears well hydrated and in no acute distress guard right side   Here with daughter  Has mild hand tremor  ocass   Tired appearing  After waiting fro x ray reading some soml=nolence     HEENT: atraumatic, conjunctiva  clear, no obvious abnormalities on inspection of external nose and ears  wadring mask off and on  NECK: no obvious masses on inspection palpation  LUNGS:  Dec bs base right ? Old  No aciute wheexing  auscultation bilaterally, no wheezes, rales or rhonchi, right lateral rib cage tendern and dependent   Bruising noted  Inferior  Mild   CV: HRRR, no clubbing cyanosis or   nl cap refill  abd soft  No ruq tenderness  MS: moves all extremities without noticeable focal  abnormality PSYCH: pleasant and cooperative, no obvious depression or anxiety Lab Results  Component Value Date   WBC 5.7 04/18/2018   HGB 15.7 04/18/2018   HCT 46.4 04/18/2018   PLT 175 04/18/2018   GLUCOSE 99 04/18/2018   CHOL 142 04/18/2018   TRIG 213 (H) 04/18/2018   HDL 36 (L) 04/18/2018   LDLDIRECT 118.8 06/14/2006   LDLCALC 63 04/18/2018   ALT 30 04/18/2018   AST 23 04/18/2018   NA 143 04/18/2018   K 3.8 04/18/2018   CL 107 04/18/2018   CREATININE 0.91 04/18/2018   BUN 10 04/18/2018   CO2 27 04/18/2018   TSH 1.77 10/15/2015   PSA 0.71 10/15/2015   INR 1.5 01/15/2016   HGBA1C 5.7 01/26/2013   BP Readings from Last 3 Encounters:  08/02/18 140/80  04/20/18 (!) 178/98  04/04/18 130/82  x ray mil displace fx no pneumothorax  Small effusion that reported is new    ASSESSMENT AND PLAN:  Discussed the following assessment and plan:  Closed fracture of one rib of right side, initial encounter  Fall, initial encounter - Plan: DG Chest 2 View, DG Ribs Unilateral Right  Rib pain on right side - Plan: DG Chest 2 View, DG Ribs Unilateral Right  Chronic anticoagulation - Plan: DG Chest  2 View, DG Ribs Unilateral Right  Hx of pulmonary embolus  Unstable gait  Alcohol use  Tobacco use Sleep disorder    Reported night terrors  Poss related to the etoh  But poss underlying    Heavy etoh and tobacco use  Uncertain how much  Daughter aware  And he had become abstinent in  Reported that  etoh will make him fall more and have risk and dec control  .  He is apparently not at a stage that he will change behaviors Reported how to  Splint and take breaths every hour and if progressive sx sob seek ed care   counseled about fall and anticoagulation meds  -Patient advised to return or notify health care team  if  new concerns arise. Total visit 45 mins > 50% spent counseling and coordinating care as indicated in above note and in instructions to patient .  Time in office  70 minutes   Patient Instructions  Use cold ice other     To rib area  and splint    When taking deep breath  Can take  Weeks for ribs to heal.  BUT if you have shortness of breath worsending contatct medical team and or seek emergency care  .  Very important to avoid falls since you are on anticoagulation.   And can bleed easily .  If you are getting shortness of breath   Increasing you need to seek care in the ED.    Take tylenol 500 mg twice  To 3 x per day a day .  Pain med's can  Make change of falling worse and you  Can not take  With alcohol  As this is risky for falling and stop breathing .    Fall Prevention in the Home, Adult Falls can cause injuries and can affect people from all age groups. There are many simple things that you can do to make your home safe and to help prevent falls. Ask for help when making these changes, if needed. What actions can I take to prevent falls? General instructions  Use good lighting in all rooms. Replace any light bulbs that burn out.  Turn on lights if it is dark. Use night-lights.  Place frequently used items in easy-to-reach places. Lower the shelves around  your home if necessary.  Set up furniture so that there are clear paths around it. Avoid moving your furniture around.  Remove throw rugs and other tripping hazards from the floor.  Avoid walking on wet floors.  Fix any uneven floor surfaces.  Add color or contrast paint or tape to grab bars and handrails in your home. Place contrasting color strips on the first and last steps of stairways.  When you use a stepladder, make sure that it is completely opened and that the sides are firmly locked. Have someone hold the ladder while you are using it. Do not climb a closed stepladder.  Be aware of any and all pets. What can I do in the bathroom?      Keep the floor dry. Immediately clean up any water that spills onto the floor.  Remove soap buildup in the tub or shower on a regular basis.  Use non-skid mats or decals on the floor of the tub or shower.  Attach bath mats securely with double-sided, non-slip rug tape.  If you need to sit down while you are in the shower, use a plastic, non-slip stool.  Install grab bars by the toilet and in the tub and shower. Do not use towel bars as grab bars. What can I do in the bedroom?  Make sure that a bedside light is easy to reach.  Do not use oversized bedding that drapes onto the floor.  Have a firm chair that has side arms to use for getting dressed. What can I do in the kitchen?  Clean up any spills right away.  If you need to reach for something above you, use a sturdy step stool that has a grab bar.  Keep electrical cables out of the way.  Do not use floor polish or wax that makes floors slippery. If you must use wax, make sure that it is non-skid floor wax. What can I do in the stairways?  Do not leave any items on the stairs.  Make sure that you have a light switch at the top of the stairs and the bottom of the stairs. Have them installed if you do not have them.  Make sure that there are handrails on both  sides of the  stairs. Fix handrails that are broken or loose. Make sure that handrails are as long as the stairways.  Install non-slip stair treads on all stairs in your home.  Avoid having throw rugs at the top or bottom of stairways, or secure the rugs with carpet tape to prevent them from moving.  Choose a carpet design that does not hide the edge of steps on the stairway.  Check any carpeting to make sure that it is firmly attached to the stairs. Fix any carpet that is loose or worn. What can I do on the outside of my home?  Use bright outdoor lighting.  Regularly repair the edges of walkways and driveways and fix any cracks.  Remove high doorway thresholds.  Trim any shrubbery on the main path into your home.  Regularly check that handrails are securely fastened and in good repair. Both sides of any steps should have handrails.  Install guardrails along the edges of any raised decks or porches.  Clear walkways of debris and clutter, including tools and rocks.  Have leaves, snow, and ice cleared regularly.  Use sand or salt on walkways during winter months.  In the garage, clean up any spills right away, including grease or oil spills. What other actions can I take?  Wear closed-toe shoes that fit well and support your feet. Wear shoes that have rubber soles or low heels.  Use mobility aids as needed, such as canes, walkers, scooters, and crutches.  Review your medicines with your health care provider. Some medicines can cause dizziness or changes in blood pressure, which increase your risk of falling. Talk with your health care provider about other ways that you can decrease your risk of falls. This may include working with a physical therapist or trainer to improve your strength, balance, and endurance. Where to find more information  Centers for Disease Control and Prevention, STEADI: WebmailGuide.co.za  [employer] on Aging: BrainJudge.co.uk Contact a health  care provider if:  You are afraid of falling at home.  You feel weak, drowsy, or dizzy at home.  You fall at home. Summary  There are many simple things that you can do to make your home safe and to help prevent falls.  Ways to make your home safe include removing tripping hazards and installing grab bars in the bathroom.  Ask for help when making these changes in your home. This information is not intended to replace advice given to you by your health care provider. Make sure you discuss any questions you have with your health care provider. Document Released: 02/20/2002 Document Revised: 10/15/2016 Document Reviewed: 10/15/2016 Elsevier Interactive Patient Education  2019 [place] K. Barbarann Kelly M.D.

## 2018-08-09 ENCOUNTER — Telehealth: Payer: Self-pay | Admitting: Family Medicine

## 2018-08-09 NOTE — Telephone Encounter (Signed)
Patient has an appt with Dr [name] for a f/u on a broken rib on Friday, 08/12/18.  He has a cough that he said started after he broke his rib.

## 2018-08-10 NOTE — Telephone Encounter (Signed)
Message sent to Dr. Jordan for review. 

## 2018-08-12 ENCOUNTER — Ambulatory Visit (INDEPENDENT_AMBULATORY_CARE_PROVIDER_SITE_OTHER): Payer: Medicare HMO | Admitting: Family Medicine

## 2018-08-12 ENCOUNTER — Ambulatory Visit (INDEPENDENT_AMBULATORY_CARE_PROVIDER_SITE_OTHER): Payer: Medicare HMO

## 2018-08-12 ENCOUNTER — Encounter: Payer: Self-pay | Admitting: Family Medicine

## 2018-08-12 ENCOUNTER — Other Ambulatory Visit: Payer: Self-pay

## 2018-08-12 VITALS — BP 140/84 | HR 100 | Temp 97.9°F | Resp 16 | Ht 68.0 in | Wt 174.0 lb

## 2018-08-12 DIAGNOSIS — F515 Nightmare disorder: Secondary | ICD-10-CM

## 2018-08-12 DIAGNOSIS — S2231XD Fracture of one rib, right side, subsequent encounter for fracture with routine healing: Secondary | ICD-10-CM | POA: Diagnosis not present

## 2018-08-12 DIAGNOSIS — R69 Illness, unspecified: Secondary | ICD-10-CM | POA: Diagnosis not present

## 2018-08-12 DIAGNOSIS — S2241XA Multiple fractures of ribs, right side, initial encounter for closed fracture: Secondary | ICD-10-CM | POA: Diagnosis not present

## 2018-08-12 NOTE — Patient Instructions (Signed)
A few things to remember from today's visit:   Closed fracture of one rib of right side with routine healing, subsequent encounter - Plan: DG Ribs Unilateral W/Chest Right   Please be sure medication list is accurate. If a new problem present, please set up appointment sooner than planned today.

## 2018-08-12 NOTE — Assessment & Plan Note (Addendum)
Chronic and reported as stable. Problem increases risk of falls. He is not concerned because it has happened for years and has not had serious injuries.  Fall precautions discussed

## 2018-08-12 NOTE — Progress Notes (Signed)
HPI:   [name] is a 78 y.o. male with Hx of COPD,tobacco use,PE,and lung adenocarcinoma who is here today to follow on recent OV.  He was seen on 08/02/2018 after fall at home on 6/76/19,JKDT was complicated by rib fracture.  He has had rib fracture in the past.  08/02/18 rib X ray:  1. Acute minimally displaced fracture of the right posterolateral ninth rib. 2. New small right pleural effusion with mild right basilar atelectasis. No pneumothorax.   He fell asleep when he was on the computer,having night mares, he woke up and tried to run and tripped. Denies head trauma. Initially he had nonproductive cough, denies hemoptysis. Cough has resolved. Right great improvements, exacerbated by certain movements. He has been taking ibuprofen 400 mg every 4 hours, last dose yesterday. He is on Eliquis 5 mg bid. He has not noted nose/gum bleeding.  Pain has greatly improved. Ecchymosis on site of trauma.  According to patient, for years he has had "night terrors", sometimes he wakes up screaming, agitated,throwing objects.  He has this problem since childhood and has been stable. He has not identified exacerbating or alleviating factors.   Review of Systems  Constitutional: Negative for activity change, appetite change, fatigue and fever.  HENT: Negative for sore throat and trouble swallowing.   Respiratory: Negative for cough, shortness of breath and wheezing.   Cardiovascular: Negative for chest pain and palpitations.  Gastrointestinal: Negative for abdominal pain, nausea and vomiting.  Genitourinary: Negative for decreased urine volume and hematuria.  Neurological: Negative for dizziness, weakness and headaches.  Hematological: Bruises/bleeds easily.      Current Outpatient Medications on File Prior to Visit  Medication Sig Dispense Refill  . amLODipine (NORVASC) 5 MG tablet     . apixaban (ELIQUIS) 5 MG TABS tablet Take 1 tablet (5 mg total) by mouth 2 (two)  times daily. 180 tablet 2  . irbesartan (AVAPRO) 150 MG tablet Take 1 tablet (150 mg total) by mouth daily. 90 tablet 2  . omeprazole (PRILOSEC) 20 MG capsule TAKE ONE CAPSULE BY MOUTH TWICE A DAY BEFORE A MEAL 180 capsule 0  . rosuvastatin (CRESTOR) 20 MG tablet Take 1 tablet (20 mg total) by mouth daily. 90 tablet 3  . thiamine (VITAMIN B-1) 100 MG tablet Take 1 tablet (100 mg total) by mouth daily. 90 tablet 2  . tiZANidine (ZANAFLEX) 2 MG tablet TAKE ONE TABLET BY MOUTH EVERY 12 HOURS AS NEEDED MUSCLE SPASMS 60 tablet 1   No current facility-administered medications on file prior to visit.      Past Medical History:  Diagnosis Date  . Adenocarcinoma of lung, stage 1 ([place])   . Anemia   . Angiodysplasia of cecum 11/23/2017   Multiple 1-2 mm  . Arthritis    back possibly  . Blood clotting disorder ([place])   . Cataract    removed bilaterally   . Chronic obstructive pulmonary disease (COPD) ([place])    "patient not aware"  . Clotting disorder (HCC)    PE LUNG, dvt leg   . Colon polyps   . Depression   . Diverticulosis   . DVT (deep venous thrombosis) ([place])    Post op  knee surgery  . DVT (deep venous thrombosis) ([place])   . GERD (gastroesophageal reflux disease)   . Histoplasmosis   . Histoplasmosis with pneumonia ([place]) 04/01/2015  . Hypercholesteremia   . Hyperlipidemia   . Hypertension   . Laceration of head 08/05/2015  .  Lumbar compression fracture ([place])   . Lung cancer ([place])   . Personal history of colonic polyps 2008   multiple adenomas 2008-2009 exams with large cecal adenoma  . Pneumonia    in history  . Polyposis coli - attenuated 09/12/2010   2008: adenomas with 9 polyps index and 2.5 cm cecal polyp removed over 3 exams, last 2009 with APC. diverticulosis also. 11/24/2010: 12 polyps removed - adenomas Anticipate routine repeat colonoscopy Spring 2013 Have recommended he see genetics counselor re ? Polyposis 03/27/2016 14 polyps removed max 15 mm   . Pulmonary nodule 06/2006    9 mm right upper obe pulmonary nodule (negative Pet Scan)  . PVD (peripheral vascular disease) ([place])    left leg s/p stent  . Sebaceous cyst    neck  . Seborrheic keratosis   . Seizures ([place])    4-5 yrs ago had seizures when in ICU- none since this time   . Sinusitis   . Tobacco abuse   . Tremor    Allergies  Allergen Reactions  . Codeine Anaphylaxis  . Hydrocodone Itching    Social History   Socioeconomic History  . Marital status: Widowed    Spouse name: Not on file  . Number of children: 2  . Years of education: Not on file  . Highest education level: Not on file  Occupational History  . Occupation: Retired    Fish farm manager: RETIRED  Social Needs  . Financial resource strain: Not on file  . Food insecurity:    Worry: Not on file    Inability: Not on file  . Transportation needs:    Medical: Not on file    Non-medical: Not on file  Tobacco Use  . Smoking status: Current Every Day Smoker    Packs/day: 1.00    Years: 60.00    Pack years: 60.00    Types: Cigarettes  . Smokeless tobacco: Never Used  Substance and Sexual Activity  . Alcohol use: Yes    Alcohol/week: 0.0 standard drinks    Comment: quit drinking after last hospitalization- may 2017  . Drug use: No  . Sexual activity: Not Currently  Lifestyle  . Physical activity:    Days per week: Not on file    Minutes per session: Not on file  . Stress: Not on file  Relationships  . Social connections:    Talks on phone: Not on file    Gets together: Not on file    Attends religious service: Not on file    Active member of club or organization: Not on file    Attends meetings of clubs or organizations: Not on file    Relationship status: Not on file  Other Topics Concern  . Not on file  Social History Narrative       Married for over 50 years.  Wife died from lung cancer.     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     Occasional alcohol.      Tobacco use - over 50 pack years.    Father died at age 25 of suicide.   Timothy Wood-daughter emergenc   y contact    Vitals:   08/12/18 1412  BP: 140/84  Pulse: 100  Resp: 16  Temp: 97.9 F (36.6 C)  SpO2: 97%   Body mass index is 26.46 kg/m.   Physical Exam  Nursing note and vitals reviewed. Constitutional: He is oriented to person, place, and time. He appears  well-developed and well-nourished. No distress.  HENT:  Head: Normocephalic and atraumatic.  Mouth/Throat: Oropharynx is clear and moist and mucous membranes are normal.  Eyes: Conjunctivae are normal.  Cardiovascular: Normal rate and regular rhythm.  No murmur heard. Respiratory: Effort normal and breath sounds normal. No respiratory distress.  Musculoskeletal:        General: No edema.  Lymphadenopathy:    He has no cervical adenopathy.  Neurological: He is alert and oriented to person, place, and time. He has normal strength.  Mildly unstable gait with no assistance.  Skin: Skin is warm. Ecchymosis noted. No erythema.     Psychiatric: He has a normal mood and affect.  Well groomed, good eye contact.    ASSESSMENT AND PLAN:  Mr. Smiley was seen today for follow-up.  Diagnoses and all orders for this visit:  Closed fracture of one rib of right side with routine healing, subsequent encounter Pain has improved. No symptom that suggest complications from rib fracture. Today rib X ray to follow on small pleural effusion right base.  Because he is on Eliquis and Hx of CAD I do nit recommend NSAID's for pain management. He does not feel like he needs prescriptions medication for pain at this times,he will let me know if he does. Clearly instructed about warning signs.  -     DG Ribs Unilateral W/Chest Right; Future  Nightmare disorder Chronic and reported as stable. Problem increases risk of falls. He is not concerned because it has happened for years and has not had serious injuries.  Fall precautions discussed    Return if  symptoms worsen or fail to improve, for Keep next appointment..    Ryhanna Dunsmore G. [name], MD  [facility]. [place] office.

## 2018-08-12 NOTE — Telephone Encounter (Signed)
Mr Timothy Wood was seen today and reported that cough has resolved.  Vaunda Gutterman [name], MD

## 2018-09-09 ENCOUNTER — Other Ambulatory Visit: Payer: Self-pay | Admitting: Family Medicine

## 2018-10-03 ENCOUNTER — Ambulatory Visit: Payer: Medicare HMO | Admitting: Family Medicine

## 2018-10-14 ENCOUNTER — Other Ambulatory Visit: Payer: Self-pay

## 2018-10-14 ENCOUNTER — Ambulatory Visit (INDEPENDENT_AMBULATORY_CARE_PROVIDER_SITE_OTHER): Payer: Medicare HMO | Admitting: Family Medicine

## 2018-10-14 ENCOUNTER — Encounter: Payer: Self-pay | Admitting: Family Medicine

## 2018-10-14 VITALS — BP 140/70 | HR 94 | Temp 97.8°F | Resp 16 | Ht 68.0 in | Wt 171.8 lb

## 2018-10-14 DIAGNOSIS — J449 Chronic obstructive pulmonary disease, unspecified: Secondary | ICD-10-CM

## 2018-10-14 DIAGNOSIS — Z0000 Encounter for general adult medical examination without abnormal findings: Secondary | ICD-10-CM

## 2018-10-14 DIAGNOSIS — F324 Major depressive disorder, single episode, in partial remission: Secondary | ICD-10-CM

## 2018-10-14 DIAGNOSIS — R251 Tremor, unspecified: Secondary | ICD-10-CM

## 2018-10-14 DIAGNOSIS — R2681 Unsteadiness on feet: Secondary | ICD-10-CM

## 2018-10-14 DIAGNOSIS — Z0189 Encounter for other specified special examinations: Secondary | ICD-10-CM

## 2018-10-14 DIAGNOSIS — I10 Essential (primary) hypertension: Secondary | ICD-10-CM | POA: Diagnosis not present

## 2018-10-14 DIAGNOSIS — R69 Illness, unspecified: Secondary | ICD-10-CM | POA: Diagnosis not present

## 2018-10-14 DIAGNOSIS — Z0001 Encounter for general adult medical examination with abnormal findings: Secondary | ICD-10-CM

## 2018-10-14 DIAGNOSIS — I2699 Other pulmonary embolism without acute cor pulmonale: Secondary | ICD-10-CM

## 2018-10-14 NOTE — Patient Instructions (Addendum)
  Timothy Wood , Thank you for taking time to come for your Medicare Wellness Visit. I appreciate your ongoing commitment to your health goals. Please review the following plan we discussed and let me know if I can assist you in the future.   These are the goals we discussed: Goals   None     This is a list of the screening recommended for you and due dates:  Health Maintenance  Topic Date Due  . Colon Cancer Screening  11/24/2018  . Tetanus Vaccine  09/10/2027  . Pneumonia vaccines  Completed  . Flu Shot  Discontinued   A few tips:  -As we age balance is not as good as it was, so there is a higher risks for falls. Please remove small rugs and furniture that is "in your way" and could increase the risk of falls. Stretching exercises may help with fall prevention: Yoga and Tai Chi are some examples. Low impact exercise is better, so you are not very achy the next day.  -Sun screen and avoidance of direct sun light recommended. Caution with dehydration, if working outdoors be sure to drink enough fluids.  - Some medications are not safe as we age, increases the risk of side effects and can potentially interact with other medication you are also taken;  including some of over the counter medications. Be sure to let me know when you start a new medication even if it is a dietary/vitamin supplement.   -Healthy diet low in red meet/animal fat and sugar + regular physical activity is recommended.      A few tips:  -As we age balance is not as good as it was, so there is a higher risks for falls. Please remove small rugs and furniture that is "in your way" and could increase the risk of falls. Stretching exercises may help with fall prevention: Yoga and Tai Chi are some examples. Low impact exercise is better, so you are not very achy the next day.  -Sun screen and avoidance of direct sun light recommended. Caution with dehydration, if working outdoors be sure to drink enough fluids.  -  Some medications are not safe as we age, increases the risk of side effects and can potentially interact with other medication you are also taken;  including some of over the counter medications. Be sure to let me know when you start a new medication even if it is a dietary/vitamin supplement.   -Healthy diet low in red meet/animal fat and sugar + regular physical activity is recommended.     Advance directives:  Please see a lawyer and/or go to this website to help you with advanced directives and designating a health care power of attorney so that your wishes will be followed should you become too ill to make your own medical decisions.  [email or web address]

## 2018-10-14 NOTE — Progress Notes (Signed)
HPI:  Mr. KILO ESHELMAN is a 78 y.o.male here today for his routine physical examination and AWV.  Last CPE: 10/22/2015. He lives alone. His daughter lives in town and visit every Thursday to have lunch with him.  Regular exercise 3 or more times per week: No Following a healthy diet: Not consistently , states that he eats what he wants  Independent for most ADL's and IADL's. His daughter manages her finances but he is able to keep up with his check account. Unstable gait, not longer using his cane.  No falls in the past year.  Functional Status Survey: Is the patient deaf or have difficulty hearing?: Yes Does the patient have difficulty seeing, even when wearing glasses/contacts?: No Does the patient have difficulty concentrating, remembering, or making decisions?: Yes Does the patient have difficulty walking or climbing stairs?: No Does the patient have difficulty dressing or bathing?: Yes Does the patient have difficulty doing errands alone such as visiting a doctor's office or shopping?: Yes  Fall Risk  10/14/2018 05/15/2015 04/01/2015 09/25/2014 02/06/2013  Falls in the past year? 1 Yes No No No  Number falls in past yr: 0 2 or more - - -  Injury with Fall? 1 No - - -  Risk for fall due to : - Impaired balance/gait;Medication side effect - - -     Providers he sees regularly: Eye care provider: He does not remember name of provider,last seen early this year. Oncologist, Dr Julien Nordmann. Hx of stage Ia non-small cell lung carcinoma, s/p wedge resection in 2016. GI, Dr Carlean Purl.  He is not longer seeing Dr Roxan Hockey, cardiothoracic surgeon.  Wife and son died a few years ago,both with lung cancer.  Hx of depression and anxiety,he was on Celexa 20 mg . He discontinued medication about a year ago,did not feel like he needed. Enjoys fishing with friends once per year. He plays "logic" games,puzzles,and crosswords.  Depression screen PHQ 2/9 10/14/2018  Decreased Interest 1   Down, Depressed, Hopeless 1  PHQ - 2 Score 2  Altered sleeping 3  Tired, decreased energy 1  Change in appetite 0  Feeling bad or failure about yourself  1  Trouble concentrating 1  Moving slowly or fidgety/restless 0  Suicidal thoughts 0  PHQ-9 Score 8  Difficult doing work/chores Not difficult at all    Mini-Cog - 10/14/18 0848    Normal clock drawing test?  yes    How many words correct?  3        Hearing Screening   125Hz  250Hz  500Hz  1000Hz  2000Hz  3000Hz  4000Hz  6000Hz  8000Hz   Right ear:           Left ear:             Visual Acuity Screening   Right eye Left eye Both eyes  Without correction:     With correction: 20/15 20/20 20/20     Chronic medical problems: COPD,lung cancer ,HTN,CAD,tobacco use,tremor, unstable gait,PE on Eliquix 5 mg bid,and back pain among some. HTN, he is on Amlodipine 5 mg and Avapro 150 mg daily. HLD on Crestor 20 mg daily. Back pain,he takes Zanaflex 2 mg bid as needed. Essential tremor,he followed with neurologist.Reporting problem as stable.   Immunization History  Administered Date(s) Administered  . Pneumococcal Conjugate-13 02/06/2013  . Pneumococcal Polysaccharide-23 08/01/2012  . Td 04/02/2009, 09/25/2011  . Tdap 09/09/2017  . Zoster 02/06/2013    Last colon cancer screening: Colonoscopy 11/2017. Last prostate ca screening: 10/2015 ,normal at  0.7. Denies dysuria,increased urinary frequency, gross hematuria,or decreased urine output.   -Denies Hx of illicit drug use. Alcohol : He has Hx of alcohol abuse. Stopped drinking alcohol for a few months after hospitalization in 2017. He drinks Gin or scotch for 2 weeks per month.  +Smoker.  Requesting Vit D checked. No Hx of vit D deficiency but his daughter is concerned about risk for COVID 74.   Review of Systems  Constitutional: Negative for activity change, appetite change and fever.  HENT: Positive for hearing loss. Negative for nosebleeds and sore throat.   Eyes: Negative  for redness and visual disturbance.  Respiratory: Positive for cough (In the morning). Negative for shortness of breath and wheezing.   Cardiovascular: Negative for chest pain, palpitations and leg swelling.  Gastrointestinal: Negative for abdominal pain, blood in stool, nausea and vomiting.  Endocrine: Negative for cold intolerance, heat intolerance, polydipsia, polyphagia and polyuria.  Genitourinary: Negative for decreased urine volume, dysuria, genital sores, hematuria and testicular pain.  Musculoskeletal: Positive for back pain and gait problem. Negative for joint swelling.  Skin: Negative for color change and rash.  Neurological: Positive for tremors. Negative for syncope, weakness and headaches.  Hematological: Negative for adenopathy. Does not bruise/bleed easily.  Psychiatric/Behavioral: Positive for sleep disturbance (Wakes up q 2 hours.Takes naps). Negative for confusion. The patient is nervous/anxious.   All other systems reviewed and are negative.   Current Outpatient Medications on File Prior to Visit  Medication Sig Dispense Refill  . amLODipine (NORVASC) 5 MG tablet TAKE ONE TABLET BY MOUTH DAILY 90 tablet 1  . apixaban (ELIQUIS) 5 MG TABS tablet Take 1 tablet (5 mg total) by mouth 2 (two) times daily. 180 tablet 2  . irbesartan (AVAPRO) 150 MG tablet Take 1 tablet (150 mg total) by mouth daily. 90 tablet 2  . omeprazole (PRILOSEC) 20 MG capsule TAKE ONE CAPSULE BY MOUTH TWICE A DAY BEFORE A MEAL 180 capsule 0  . rosuvastatin (CRESTOR) 20 MG tablet Take 1 tablet (20 mg total) by mouth daily. 90 tablet 3  . thiamine (VITAMIN B-1) 100 MG tablet Take 1 tablet (100 mg total) by mouth daily. 90 tablet 2  . tiZANidine (ZANAFLEX) 2 MG tablet TAKE ONE TABLET BY MOUTH EVERY 12 HOURS AS NEEDED MUSCLE SPASMS 60 tablet 1   No current facility-administered medications on file prior to visit.      Past Medical History:  Diagnosis Date  . Adenocarcinoma of lung, stage 1 ([place])   .  Anemia   . Angiodysplasia of cecum 11/23/2017   Multiple 1-2 mm  . Arthritis    back possibly  . Blood clotting disorder ([place])   . Cataract    removed bilaterally   . Chronic obstructive pulmonary disease (COPD) ([place])    "patient not aware"  . Clotting disorder (HCC)    PE LUNG, dvt leg   . Colon polyps   . Depression   . Diverticulosis   . DVT (deep venous thrombosis) ([place])    Post op  knee surgery  . DVT (deep venous thrombosis) ([place])   . GERD (gastroesophageal reflux disease)   . Histoplasmosis   . 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 Hypercholesteremia   . Hyperlipidemia   . Hypertension   . Laceration of head 08/05/2015  . Lumbar compression fracture ([place])   . Lung cancer ([place])   . Personal history of colonic polyps 2008   multiple adenomas 2008-2009 exams with large cecal adenoma  . Pneumonia  in history  . Polyposis coli - attenuated 09/12/2010   2008: adenomas with 9 polyps index and 2.5 cm cecal polyp removed over 3 exams, last 2009 with APC. diverticulosis also. 11/24/2010: 12 polyps removed - adenomas Anticipate routine repeat colonoscopy Spring 2013 Have recommended he see genetics counselor re ? Polyposis 03/27/2016 14 polyps removed max 15 mm   . Pulmonary nodule 06/2006   9 mm right upper obe pulmonary nodule (negative Pet Scan)  . PVD (peripheral vascular disease) ([place])    left leg s/p stent  . Sebaceous cyst    neck  . Seborrheic keratosis   . Seizures ([place])    4-5 yrs ago had seizures when in ICU- none since this time   . Sinusitis   . Tobacco abuse   . Tremor     Past Surgical History:  Procedure Laterality Date  . ANGIOPLASTY / STENTING ILIAC  08/2008   Dr. Trula Slade  . BACK SURGERY  1975, 1978   x2 lumbar fusion over 30 years ago  . BACK SURGERY    . COLONOSCOPY  05/11/2006; 07/27/2006;05/05/2007, 11/24/2010   adenomas with 9 polyps index and 2.5 cm cecal polyp removed over 3 exams, last 2009 with APC. diverticulosis also.  2012: 12  adenomas removed, largest 8-10 mm, diverticulosis  . CORONARY ANGIOPLASTY WITH STENT PLACEMENT    . DESCENDING AORTIC ANEURYSM REPAIR W/ STENT  04/2010   Dr. Trula Slade  . ESOPHAGOGASTRODUODENOSCOPY  05/05/2007   GERD - one erosion  . EYE SURGERY Bilateral    cataract  . KNEE SURGERY  2002   left- cartilage   . LUNG BIOPSY    . PERCUTANEOUS PINNING Left 06/24/2012   Procedure: Repair Complex Laceration Left Thumb with percutaneous pinning.;  Surgeon: Dennie Bible, MD;  Location: [facility];  Service: Plastics;  Laterality: Left;  . THUMB AMPUTATION Left    pinning and index finger  . TONSILLECTOMY  1956  . TONSILLECTOMY    . UPPER GASTROINTESTINAL ENDOSCOPY    . VIDEO ASSISTED THORACOSCOPY (VATS)/WEDGE RESECTION Right 01/24/2015   Procedure: RIGHT VIDEO ASSISTED THORACOSCOPY (VATS)/WEDGE RESECTION;  Surgeon: [name], MD;  Location: [facility];  Service: Thoracic;  Laterality: Right;  . vocal cord polyps removed      Allergies  Allergen Reactions  . Codeine Anaphylaxis  . Hydrocodone Itching    Family History  Problem Relation Age of Onset  . Colon cancer Mother 62       multiple colon polyps  . Deep vein thrombosis Mother   . Cancer Mother 31       uterine cancer  . Uterine cancer Mother   . Lung cancer Mother   . Lung cancer Son        smoked and was a Building control surveyor  . Heart disease Brother   . Diabetes Other   . Heart disease Brother   . Rheum arthritis Daughter   . Rheum arthritis Sister   . Colon polyps Neg Hx     Social History   Socioeconomic History  . Marital status: Widowed    Spouse name: Not on file  . Number of children: 2  . Years of education: Not on file  . Highest education level: Not on file  Occupational History  . Occupation: Retired    Fish farm manager: RETIRED  Social Needs  . Financial resource strain: Not on file  . Food insecurity    Worry: Not on file    Inability: Not on file  . Transportation needs  Medical: Not on file    Non-medical:  Not on file  Tobacco Use  . Smoking status: Current Every Day Smoker    Packs/day: 1.00    Years: 60.00    Pack years: 60.00    Types: Cigarettes  . Smokeless tobacco: Never Used  Substance and Sexual Activity  . Alcohol use: Yes    Alcohol/week: 0.0 standard drinks    Comment: quit drinking after last hospitalization- may 2017  . Drug use: No  . Sexual activity: Not Currently  Lifestyle  . Physical activity    Days per week: Not on file    Minutes per session: Not on file  . Stress: Not on file  Relationships  . Social Herbalist on phone: Not on file    Gets together: Not on file    Attends religious service: Not on file    Active member of club or organization: Not on file    Attends meetings of clubs or organizations: Not on file    Relationship status: Not on file  Other Topics Concern  . Not on file  Social History Narrative       Married for over 50 years.  Wife died from lung cancer.     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     Occasional alcohol.      Tobacco use - over 50 pack years.   Father died at age 96 of suicide.   Michele Ellis-daughter emergenc   y contact     Vitals:   10/14/18 0739  BP: 140/70  Pulse: 94  Resp: 16  Temp: 97.8 F (36.6 C)  SpO2: 93%   Body mass index is 26.12 kg/m.   Wt Readings from Last 3 Encounters:  10/14/18 171 lb 12.8 oz (77.9 kg)  08/12/18 174 lb (78.9 kg)  04/20/18 177 lb 4.8 oz (80.4 kg)    Physical Exam  Nursing note and vitals reviewed. Constitutional: He is oriented to person, place, and time. He appears well-developed. No distress.  HENT:  Head: Atraumatic.  Right Ear: Tympanic membrane, external ear and ear canal normal.  Left Ear: Tympanic membrane, external ear and ear canal normal.  Mouth/Throat: Oropharynx is clear and moist and mucous membranes are normal.  Eyes: Pupils are equal, round, and reactive to light. Conjunctivae and EOM are normal.  Neck: Normal  range of motion. No tracheal deviation present.  Cardiovascular: Normal rate and regular rhythm.  No murmur heard. DP pulses present bilateral.  Respiratory: Effort normal and breath sounds normal. No respiratory distress.  GI: Soft. He exhibits no mass. There is no hepatomegaly. There is no abdominal tenderness.  Genitourinary:    Genitourinary Comments: No concerns.   Musculoskeletal:        General: No tenderness or edema.     Comments: No major deformities appreciated and no signs of synovitis.  Lymphadenopathy:    He has no cervical adenopathy.  Neurological: He is alert and oriented to person, place, and time. He has normal strength. He displays tremor. No sensory deficit. Gait abnormal.  Reflex Scores:      Bicep reflexes are 2+ on the right side and 2+ on the left side.      Patellar reflexes are 2+ on the right side and 2+ on the left side. Skin: Skin is warm. No erythema.  Psychiatric: He has a normal mood and affect.  Well groomed,good eye contact.    ASSESSMENT AND  PLAN:  Mr. Horst was seen today for annual exam and medicare wellness.  Diagnoses and all orders for this visit:  Orders Placed This Encounter  Procedures  . VITAMIN D 25 Hydroxy (Vit-D Deficiency, Fractures)    Encounter for general adult medical examination with abnormal findings We discussed the importance of regular physical activity as tolerated and healthy diet for prevention of chronic illness and/or complications. Preventive guidelines reviewed. Vaccination up to date.  Next CPE in a year.  Medicare annual wellness visit, subsequent We discussed the importance of staying active, physically and mentally, as well as the benefits of a healthy/balance diet. Low impact exercise that involve stretching and strengthing are ideal. Vaccines: Influenza vaccine in 11/2018.  We discussed preventive screening for the next 5-10 years, summery of recommendations given in AVS.  Fall prevention.  Advance  directives and end of life discussed, he does not have neither one,strongly recommend   Unstable gait Moderate to high risk for falls. He does not think he needs assistance. Fall precautions discussed.  Essential hypertension BP adequately controlled. No changes in current management. F/U in 6 months.  Tremor Problem is stable. We will continue following.  Patient request for diagnostic testing He understand that health insurance will not cover test.  - VITAMIN D 25 Hydroxy (Vit-D Deficiency, Fractures); Future  Other pulmonary embolism without acute cor pulmonale, unspecified chronicity (HCC) On Eliquis 5 mg bid,tolerating well.  Chronic obstructive pulmonary disease, unspecified COPD type (HCC) Morning cough but no wheezing or dyspnea. Problem is stable. Still smoking and not interested in smoking cessation.   Major depressive disorder with single episode, in partial remission ([place]) Still active and stable. He is not interested in pharmacologic treatment. Instructed about warning signs.    Return in 6 months (on 04/16/2019) for HTN.    Hulen Mandler G. [name], MD  [facility]. [place] office.

## 2018-11-21 ENCOUNTER — Other Ambulatory Visit: Payer: Self-pay | Admitting: Family Medicine

## 2018-11-21 DIAGNOSIS — I10 Essential (primary) hypertension: Secondary | ICD-10-CM

## 2018-12-28 ENCOUNTER — Other Ambulatory Visit: Payer: Self-pay | Admitting: Family Medicine

## 2018-12-28 DIAGNOSIS — K219 Gastro-esophageal reflux disease without esophagitis: Secondary | ICD-10-CM

## 2018-12-28 DIAGNOSIS — G8929 Other chronic pain: Secondary | ICD-10-CM

## 2018-12-28 DIAGNOSIS — M545 Low back pain, unspecified: Secondary | ICD-10-CM

## 2018-12-30 ENCOUNTER — Encounter: Payer: Self-pay | Admitting: Internal Medicine

## 2019-02-01 ENCOUNTER — Other Ambulatory Visit: Payer: Self-pay

## 2019-02-01 ENCOUNTER — Ambulatory Visit: Payer: Medicare HMO | Admitting: Gastroenterology

## 2019-02-01 ENCOUNTER — Encounter: Payer: Self-pay | Admitting: Gastroenterology

## 2019-02-01 ENCOUNTER — Telehealth: Payer: Self-pay | Admitting: General Surgery

## 2019-02-01 VITALS — BP 128/72 | HR 78 | Temp 97.6°F | Ht 68.0 in | Wt 175.0 lb

## 2019-02-01 DIAGNOSIS — Z860100 Personal history of colon polyps, unspecified: Secondary | ICD-10-CM | POA: Insufficient documentation

## 2019-02-01 DIAGNOSIS — D126 Benign neoplasm of colon, unspecified: Secondary | ICD-10-CM

## 2019-02-01 DIAGNOSIS — Z7901 Long term (current) use of anticoagulants: Secondary | ICD-10-CM

## 2019-02-01 DIAGNOSIS — Z1159 Encounter for screening for other viral diseases: Secondary | ICD-10-CM | POA: Diagnosis not present

## 2019-02-01 MED ORDER — NA SULFATE-K SULFATE-MG SULF 17.5-3.13-1.6 GM/177ML PO SOLN: 1.0000 | Freq: Once | ORAL | 0 refills | Status: AC

## 2019-02-01 NOTE — Progress Notes (Signed)
02/01/2019 Timothy Wood [id number] Sep 23, 1940   HISTORY OF PRESENT ILLNESS: This is a 78 year old male who is a patient of Dr. Celesta Aver.  He has history of adenomatous colon polyps and has attenuated polyposis/positive polyposis gene.  Last colonoscopy was in September 2019 with repeat recommended in 1 year.  He is here today to schedule that procedure.  He is on Eliquis for history of DVT/PEs.  This is prescribed by his PCP, Dr. [name].  He denies any GI complaints at this time.   Past Medical History:  Diagnosis Date  . Adenocarcinoma of lung, stage 1 ([place])   . Anemia   . Angiodysplasia of cecum 11/23/2017   Multiple 1-2 mm  . Arthritis    back possibly  . Blood clotting disorder ([place])   . Cataract    removed bilaterally   . Chronic obstructive pulmonary disease (COPD) ([place])    "patient not aware"  . Clotting disorder (HCC)    PE LUNG, dvt leg   . Colon polyps   . Depression   . Diverticulosis   . DVT (deep venous thrombosis) ([place])    Post op  knee surgery  . DVT (deep venous thrombosis) ([place])   . GERD (gastroesophageal reflux disease)   . Histoplasmosis   . 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 Hypercholesteremia   . Hyperlipidemia   . Hypertension   . Laceration of head 08/05/2015  . Lumbar compression fracture ([place])   . Lung cancer ([place])   . Personal history of colonic polyps 2008   multiple adenomas 2008-2009 exams with large cecal adenoma  . Pneumonia    in history  . Polyposis coli - attenuated 09/12/2010   2008: adenomas with 9 polyps index and 2.5 cm cecal polyp removed over 3 exams, last 2009 with APC. diverticulosis also. 11/24/2010: 12 polyps removed - adenomas Anticipate routine repeat colonoscopy Spring 2013 Have recommended he see genetics counselor re ? Polyposis 03/27/2016 14 polyps removed max 15 mm   . Pulmonary nodule 06/2006   9 mm right upper obe pulmonary nodule (negative Pet Scan)  . PVD (peripheral vascular disease) ([place])    left leg s/p stent  . Sebaceous cyst    neck  . Seborrheic keratosis   . Seizures ([place])    4-5 yrs ago had seizures when in ICU- none since this time   . Sinusitis   . Tobacco abuse   . Tremor    Past Surgical History:  Procedure Laterality Date  . ANGIOPLASTY / STENTING ILIAC  08/2008   Dr. Trula Slade  . BACK SURGERY  1975, 1978   x2 lumbar fusion over 30 years ago  . BACK SURGERY    . COLONOSCOPY  05/11/2006; 07/27/2006;05/05/2007, 11/24/2010   adenomas with 9 polyps index and 2.5 cm cecal polyp removed over 3 exams, last 2009 with APC. diverticulosis also.  2012: 12 adenomas removed, largest 8-10 mm, diverticulosis  . CORONARY ANGIOPLASTY WITH STENT PLACEMENT    . DESCENDING AORTIC ANEURYSM REPAIR W/ STENT  04/2010   Dr. Trula Slade  . ESOPHAGOGASTRODUODENOSCOPY  05/05/2007   GERD - one erosion  . EYE SURGERY Bilateral    cataract  . KNEE SURGERY  2002   left- cartilage   . LUNG BIOPSY    . PERCUTANEOUS PINNING Left 06/24/2012   Procedure: Repair Complex Laceration Left Thumb with percutaneous pinning.;  Surgeon: Dennie Bible, MD;  Location: [facility];  Service: Plastics;  Laterality: Left;  . THUMB AMPUTATION Left  pinning and index finger  . TONSILLECTOMY  1956  . TONSILLECTOMY    . UPPER GASTROINTESTINAL ENDOSCOPY    . VIDEO ASSISTED THORACOSCOPY (VATS)/WEDGE RESECTION Right 01/24/2015   Procedure: RIGHT VIDEO ASSISTED THORACOSCOPY (VATS)/WEDGE RESECTION;  Surgeon: [name], MD;  Location: [facility];  Service: Thoracic;  Laterality: Right;  . vocal cord polyps removed      reports that he has been smoking cigarettes. He has a 60.00 pack-year smoking history. He has never used smokeless tobacco. He reports current alcohol use. He reports that he does not use drugs. family history includes Cancer (age of onset: 67) in his mother; Colon cancer (age of onset: 53) in his mother; Deep vein thrombosis in his mother; Diabetes in an other family member; Heart disease in his brother  and brother; Lung cancer in his mother and son; Rheum arthritis in his daughter and sister; Uterine cancer in his mother. Allergies  Allergen Reactions  . Codeine Anaphylaxis  . Hydrocodone Itching      Outpatient Encounter Medications as of 02/01/2019  Medication Sig  . amLODipine (NORVASC) 5 MG tablet TAKE ONE TABLET BY MOUTH DAILY  . apixaban (ELIQUIS) 5 MG TABS tablet Take 1 tablet (5 mg total) by mouth 2 (two) times daily.  . irbesartan (AVAPRO) 150 MG tablet TAKE ONE TABLET BY MOUTH DAILY  . omeprazole (PRILOSEC) 20 MG capsule TAKE ONE CAPSULE BY MOUTH TWICE A DAY BEFORE MEALS  . rosuvastatin (CRESTOR) 20 MG tablet Take 1 tablet (20 mg total) by mouth daily.  [name] Kitchen thiamine (VITAMIN B-1) 100 MG tablet Take 1 tablet (100 mg total) by mouth daily.  [name] Kitchen tiZANidine (ZANAFLEX) 2 MG tablet TAKE ONE TABLET BY MOUTH EVERY 12 HOURS AS NEEDED FOR MUSCLE SPASMS   No facility-administered encounter medications on file as of 02/01/2019.      REVIEW OF SYSTEMS  : All other systems reviewed and negative except where noted in the History of Present Illness.   PHYSICAL EXAM: BP 128/72   Pulse 78   Temp 97.6 F (36.4 C) (Temporal)   Ht 5\' 8"  (1.727 m)   Wt 175 lb (79.4 kg)   BMI 26.61 kg/m  General: Well developed white male in no acute distress Head: Normocephalic and atraumatic Eyes:  Sclerae anicteric, conjunctiva pink. Ears: Normal auditory acuity Lungs: Clear throughout to auscultation; no increased WOB. Heart: Regular rate and rhythm; no M/R/G. Abdomen: Soft, non-distended.  BS present.  Non-tender. Rectal:  Will be done at the time of colonoscopy. Musculoskeletal: Symmetrical with no gross deformities  Skin: No lesions on visible extremities Extremities: No edema  Neurological: Alert oriented x 4, grossly non-focal Psychological:  Alert and cooperative. Normal mood and affect  ASSESSMENT AND PLAN: *Personal history of colon polyps and positive polyposis gene/attenuated  polyposis:  Last colonoscopy 11/2017.  Repeat recommended in 1 year.  Will schedule with Dr. Carlean Purl. *Chronic anticoagulation with Eliquis due to history of DVT/PE:  Will hold Eliquis for 2 days prior to endoscopic procedures - will instruct when and how to resume after procedure. Benefits and risks of procedure explained including risks of bleeding, perforation, infection, missed lesions, reactions to medications and possible need for hospitalization and surgery for complications. Additional rare but real risk of stroke or other vascular clotting events off of Eliqus also explained and need to seek urgent help if any signs of these problems occur. Will communicate by phone or EMR with patient's  prescribing provider, Dr. [name], to confirm that holding Eliquis is reasonable  in this case.  CC:  [name], [name], MD

## 2019-02-01 NOTE — Telephone Encounter (Signed)
I will send a message to the schedulers to please call pt and schedule a New Pt appt, pt last seen 2010. New Pt appt for surgery clearance. I will send a message to Guinevere Ferrari, CMA as well for chart prep.

## 2019-02-01 NOTE — Patient Instructions (Signed)
If you are age 78 or older, your body mass index should be between 23-30. Your Body mass index is 26.61 kg/m. If this is out of the aforementioned range listed, please consider follow up with your Primary Care Provider.  If you are age 55 or younger, your body mass index should be between 19-25. Your Body mass index is 26.61 kg/m. If this is out of the aformentioned range listed, please consider follow up with your Primary Care Provider.   You have been scheduled for a colonoscopy. Please follow written instructions given to you at your visit today.  Please pick up your prep supplies at the pharmacy within the next 1-3 days.  We have sent the following medications to your pharmacy for you to pick up at your convenience:  suprep  Thank you for choosing me and [facility]

## 2019-02-01 NOTE — Telephone Encounter (Signed)
   Primary Cardiologist:No primary care provider on file.  Chart reviewed as part of pre-operative protocol coverage. It does not look like patient has been seen by our service since at least 2010. Therefore, he will require an appointment to re-establish care and to further assess pre-operative cardiovascular risk.  Pre-op covering staff: - Please schedule appointment and call patient to inform them. - Please contact requesting surgeon's office via preferred method (i.e, phone, fax) to inform them of need for appointment prior to surgery.  Will also route note to pharmacy pool for their input on holding Eliquis prior to procedure so that this information is available at time of patient's appointment.   Darreld Mclean, PA-C  02/01/2019, 3:50 PM

## 2019-02-01 NOTE — Telephone Encounter (Signed)
[facility] Pre-operative Risk Assessment     Request for surgical clearance:     Endoscopy Procedure  What type of surgery is being performed?     colonsocopy  When is this surgery scheduled?     03/01/2019  What type of clearance is required ?   Pharmacy  Are there any medications that need to be held prior to surgery and how long? Eliquis for 2 days  Practice name and name of physician performing surgery?      [facility]  What is your office phone and fax number?      Phone- [phone]  [phone]  Anesthesia type (None, local, MAC, general) ?       MAC

## 2019-02-02 NOTE — Telephone Encounter (Signed)
Patient with diagnosis of provoked DVT after surgery and non provoked PE in 2007 on Eliquis for anticoagulation.    Procedure: colonsocopy Date of procedure: 03/01/2019  CrCl 40ml/min  Due to patients history of multiple DVT/PE would recommend patient hold Eliquis only 1 day prior to procedure.

## 2019-02-02 NOTE — Telephone Encounter (Signed)
Waynard Edwards A sent to Michae Kava, [place]; [facility] [address] Scheduling; [place]        I called and left a message for the patient to call back to schedule new pt appt.

## 2019-02-02 NOTE — Telephone Encounter (Signed)
Contacted the patient and left a detailed message to hold his Eliquis for 1 day prior to his procedure. Patient instructed to call the office with any questions.

## 2019-02-03 NOTE — Telephone Encounter (Signed)
I received note from the [place] scheduler for our office who has tried to reach pt a number of times for a new pt appt per pre op team pt needs appt for surgery clearance. Today scheduler was able to reach the pt and pt answered the phone and hung up. No appt was made.

## 2019-02-20 ENCOUNTER — Encounter: Payer: Self-pay | Admitting: General Practice

## 2019-02-27 ENCOUNTER — Encounter: Payer: Self-pay | Admitting: Internal Medicine

## 2019-02-27 ENCOUNTER — Ambulatory Visit (INDEPENDENT_AMBULATORY_CARE_PROVIDER_SITE_OTHER): Payer: Medicare HMO

## 2019-02-27 ENCOUNTER — Other Ambulatory Visit: Payer: Self-pay | Admitting: Internal Medicine

## 2019-02-27 DIAGNOSIS — Z1159 Encounter for screening for other viral diseases: Secondary | ICD-10-CM | POA: Diagnosis not present

## 2019-02-28 ENCOUNTER — Other Ambulatory Visit: Payer: Self-pay

## 2019-02-28 ENCOUNTER — Ambulatory Visit: Payer: Medicare HMO | Admitting: Cardiology

## 2019-02-28 ENCOUNTER — Ambulatory Visit: Payer: Medicare HMO | Admitting: Cardiovascular Disease

## 2019-02-28 VITALS — BP 138/84 | HR 84 | Ht 68.0 in | Wt 178.0 lb

## 2019-02-28 DIAGNOSIS — I10 Essential (primary) hypertension: Secondary | ICD-10-CM | POA: Diagnosis not present

## 2019-02-28 DIAGNOSIS — Z720 Tobacco use: Secondary | ICD-10-CM | POA: Diagnosis not present

## 2019-02-28 DIAGNOSIS — Z01810 Encounter for preprocedural cardiovascular examination: Secondary | ICD-10-CM | POA: Diagnosis not present

## 2019-02-28 DIAGNOSIS — I739 Peripheral vascular disease, unspecified: Secondary | ICD-10-CM

## 2019-02-28 DIAGNOSIS — E785 Hyperlipidemia, unspecified: Secondary | ICD-10-CM

## 2019-02-28 LAB — SARS CORONAVIRUS 2 (TAT 6-24 HRS): SARS Coronavirus 2: NEGATIVE

## 2019-02-28 NOTE — Progress Notes (Signed)
Cardiology Office Note:    Date:  02/28/2019   ID:  Gwenyth Bouillon, DOB 08-22-1940, MRN [id number]  PCP:  [name], [name], MD  Cardiologist:  Donato Heinz, MD  Electrophysiologist:  None   Referring MD: Loralie Champagne, PA-C   Chief Complaint  Patient presents with  . Pre-op Exam    History of Present Illness:    Timothy Wood is a 78 y.o. male with a hx of lung cancer, adenomatous colon polyps, DVT/PE on Eliquis, COPD, tobacco use, seizures, hypertension, hyperlipidemia, PAD s/p iliac stenting in 2010, and descending aortic aneurysm status post repair in 2012 who is referred for preop evaluation prior to colonoscopy.  He reports that he has been holding his Eliquis since last Thursday, as he thought he needed to hold his Eliquis for 5 days prior to his procedure.  He reports that his activity is limited by back pain.  He does not regularly exercise.  However last week he locked his keys in his car and had to walk half a mile to his home.  He was able to do this without stopping, denies any dyspnea or chest pain.  He continues to smoke, as he has smoked 1 pack/day for over 60 years.  Brother died of MI at age 38.   Past Medical History:  Diagnosis Date  . Adenocarcinoma of lung, stage 1 ([place])   . Anemia   . Angiodysplasia of cecum 11/23/2017   Multiple 1-2 mm  . Arthritis    back possibly  . Blood clotting disorder ([place])   . Cataract    removed bilaterally   . Chronic obstructive pulmonary disease (COPD) ([place])    "patient not aware"  . Clotting disorder ([facility])    PE LUNG, dvt leg   . Colon polyps   . Depression   . Diverticulosis   . DVT (deep venous thrombosis) ([place])    Post op  knee surgery  . DVT (deep venous thrombosis) ([place])   . GERD (gastroesophageal reflux disease)   . Histoplasmosis   . Histoplasmosis with pneumonia ([place]) 04/01/2015  . Hypercholesteremia   . Hyperlipidemia   . Hypertension   . Laceration of head 08/05/2015  . Lumbar compression  fracture ([place])   . Lung cancer ([place])   . Personal history of colonic polyps 2008   multiple adenomas 2008-2009 exams with large cecal adenoma  . Pneumonia    in history  . Polyposis coli - attenuated 09/12/2010   2008: adenomas with 9 polyps index and 2.5 cm cecal polyp removed over 3 exams, last 2009 with APC. diverticulosis also. 11/24/2010: 12 polyps removed - adenomas Anticipate routine repeat colonoscopy Spring 2013 Have recommended he see genetics counselor re ? Polyposis 03/27/2016 14 polyps removed max 15 mm   . Pulmonary nodule 06/2006   9 mm right upper obe pulmonary nodule (negative Pet Scan)  . PVD (peripheral vascular disease) ([place])    left leg s/p stent  . Sebaceous cyst    neck  . Seborrheic keratosis   . Seizures ([place])    4-5 yrs ago had seizures when in ICU- none since this time   . Sinusitis   . Tobacco abuse   . Tremor     Past Surgical History:  Procedure Laterality Date  . ANGIOPLASTY / STENTING ILIAC  08/2008   Dr. Trula Slade  . BACK SURGERY  1975, 1978   x2 lumbar fusion over 30 years ago  . BACK SURGERY    .  COLONOSCOPY  05/11/2006; 07/27/2006;05/05/2007, 11/24/2010   adenomas with 9 polyps index and 2.5 cm cecal polyp removed over 3 exams, last 2009 with APC. diverticulosis also.  2012: 12 adenomas removed, largest 8-10 mm, diverticulosis  . CORONARY ANGIOPLASTY WITH STENT PLACEMENT    . DESCENDING AORTIC ANEURYSM REPAIR W/ STENT  04/2010   Dr. Trula Slade  . ESOPHAGOGASTRODUODENOSCOPY  05/05/2007   GERD - one erosion  . EYE SURGERY Bilateral    cataract  . KNEE SURGERY  2002   left- cartilage   . LUNG BIOPSY    . PERCUTANEOUS PINNING Left 06/24/2012   Procedure: Repair Complex Laceration Left Thumb with percutaneous pinning.;  Surgeon: Dennie Bible, MD;  Location: [place] [facility];  Service: Plastics;  Laterality: Left;  . THUMB AMPUTATION Left    pinning and index finger  . TONSILLECTOMY  1956  . TONSILLECTOMY    . UPPER GASTROINTESTINAL ENDOSCOPY    . VIDEO ASSISTED  THORACOSCOPY (VATS)/WEDGE RESECTION Right 01/24/2015   Procedure: RIGHT VIDEO ASSISTED THORACOSCOPY (VATS)/WEDGE RESECTION;  Surgeon: [name], MD;  Location: [facility];  Service: Thoracic;  Laterality: Right;  . vocal cord polyps removed      Current Medications: Current Meds  Medication Sig  . amLODipine (NORVASC) 5 MG tablet TAKE ONE TABLET BY MOUTH DAILY  . apixaban (ELIQUIS) 5 MG TABS tablet Take 1 tablet (5 mg total) by mouth 2 (two) times daily.  . irbesartan (AVAPRO) 150 MG tablet TAKE ONE TABLET BY MOUTH DAILY  . omeprazole (PRILOSEC) 20 MG capsule TAKE ONE CAPSULE BY MOUTH TWICE A DAY BEFORE MEALS  . rosuvastatin (CRESTOR) 20 MG tablet Take 1 tablet (20 mg total) by mouth daily.  [name] Kitchen thiamine (VITAMIN B-1) 100 MG tablet Take 1 tablet (100 mg total) by mouth daily.  [name] Kitchen tiZANidine (ZANAFLEX) 2 MG tablet TAKE ONE TABLET BY MOUTH EVERY 12 HOURS AS NEEDED FOR MUSCLE SPASMS     Allergies:   Codeine and Hydrocodone   Social History   Socioeconomic History  . Marital status: Widowed    Spouse name: Not on file  . Number of children: 2  . Years of education: Not on file  . Highest education level: Not on file  Occupational History  . Occupation: Retired    Fish farm manager: RETIRED  Tobacco Use  . Smoking status: Current Every Day Smoker    Packs/day: 1.00    Years: 60.00    Pack years: 60.00    Types: Cigarettes  . Smokeless tobacco: Never Used  Substance and Sexual Activity  . Alcohol use: Yes    Alcohol/week: 0.0 standard drinks    Comment: quit drinking after last hospitalization- may 2017  . Drug use: No  . Sexual activity: Not Currently  Other Topics Concern  . Not on file  Social History Narrative       Married for over 50 years.  Wife died from lung cancer.     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     Occasional alcohol.      Tobacco use - over 50 pack years.   Father died at age 55 of suicide.   [employer] manager    y contact   Social Determinants of Health   Financial Resource Strain:   . Difficulty of Paying Living Expenses: Not on file  Food Insecurity:   . Worried About Charity fundraiser in the Last Year: Not on file  . Ran Out of Food in  the Last Year: Not on file  Transportation Needs:   . Lack of Transportation (Medical): Not on file  . Lack of Transportation (Non-Medical): Not on file  Physical Activity:   . Days of Exercise per Week: Not on file  . Minutes of Exercise per Session: Not on file  Stress:   . Feeling of Stress : Not on file  Social Connections:   . Frequency of Communication with Friends and Family: Not on file  . Frequency of Social Gatherings with Friends and Family: Not on file  . Attends Religious Services: Not on file  . Active Member of Clubs or Organizations: Not on file  . Attends Archivist Meetings: Not on file  . Marital Status: Not on file     Family History: The patient's family history includes Cancer (age of onset: 106) in his mother; Colon cancer (age of onset: 46) in his mother; Deep vein thrombosis in his mother; Diabetes in an other family member; Heart disease in his brother and brother; Lung cancer in his mother and son; Rheum arthritis in his daughter and sister; Uterine cancer in his mother. There is no history of Colon polyps.  ROS:   Please see the history of present illness.     All other systems reviewed and are negative.  EKGs/Labs/Other Studies Reviewed:    The following studies were reviewed today:   EKG:  EKG is  ordered today.  The ekg ordered today demonstrates normal sinus rhythm, rate 84, no ST/T abnormalities  TTE 08/06/15: - Left ventricle: The cavity size was normal. Wall thickness was   normal. Systolic function was vigorous. The estimated ejection   fraction was in the range of 65% to 70%. Wall motion was normal;   there were no regional wall motion abnormalities. Doppler   parameters are consistent with abnormal  left ventricular   relaxation (grade 1 diastolic dysfunction).  Recent Labs: 04/18/2018: ALT 30; BUN 10; Creatinine 0.91; Hemoglobin 15.7; Platelet Count 175; Potassium 3.8; Sodium 143  Recent Lipid Panel    Component Value Date/Time   CHOL 142 04/18/2018 0000   TRIG 213 (H) 04/18/2018 0000   HDL 36 (L) 04/18/2018 0000   CHOLHDL 4 10/15/2015 0801   VLDL 22.6 10/15/2015 0801   LDLCALC 63 04/18/2018 0000   LDLDIRECT 118.8 06/14/2006 1144    Physical Exam:    VS:  BP 138/84   Pulse 84   Ht 5\' 8"  (1.727 m)   Wt 178 lb (80.7 kg)   SpO2 98%   BMI 27.06 kg/m     Wt Readings from Last 3 Encounters:  02/28/19 178 lb (80.7 kg)  02/01/19 175 lb (79.4 kg)  10/14/18 171 lb 12.8 oz (77.9 kg)     GEN: Well nourished, well developed in no acute distress HEENT: Normal NECK: No JVD LYMPHATICS: No lymphadenopathy CARDIAC: RRR, no murmurs, rubs, gallops RESPIRATORY:  Clear to auscultation without rales, wheezing or rhonchi  ABDOMEN: Soft, non-tender, non-distended MUSCULOSKELETAL:  No edema; No deformity  SKIN: Warm and dry NEUROLOGIC:  Alert and oriented x 3 PSYCHIATRIC:  Normal affect   ASSESSMENT:    1. Pre-operative cardiovascular examination   2. Tobacco use   3. PAD (peripheral artery disease) ([place])   4. Essential hypertension   5. Hyperlipidemia, unspecified hyperlipidemia type    PLAN:    In order of problems listed above:  Preop evaluation: Prior to colonoscopy.  No symptoms to suggest active cardiac condition.  Poor functional capacity due to  back pain, but sounds like is able to do greater than 4 METS of activity.  RCRI score 0.   -Given low risk procedure, no further cardiac work-up recommended prior to procedure -Advised that Eliquis only needs to be held for 24 hours prior to colonoscopy.  He reports he has been holding for 5 days.  Cautioned against holding his anticoagulation for that long given his history of DVT/PE.  Colonoscopy scheduled for tomorrow, recommend  restarting Eliquis as soon as possible following procedure  Hypertension: Appears controlled on amlodipine, irbesartan  PAD: Status post iliac artery stenting.  Continue apixaban, rosuvastatin  Hyperlipidemia: Goal LDL less than 70 given PAD.  On rosuvastatin 20 mg daily.  Last LDL 63 on 04/18/2018  Tobacco use: Patient counseled on the risks of tobacco use and cessation strongly encouraged.  Patient is not interested in quitting smoking at this time  RTC in 1 year    Medication Adjustments/Labs and Tests Ordered: Current medicines are reviewed at length with the patient today.  Concerns regarding medicines are outlined above.  Orders Placed This Encounter  Procedures  . EKG 12-Lead   No orders of the defined types were placed in this encounter.   Patient Instructions  Medication Instructions:  RESTART ELIQUIS  ONCE THE GI  GIVE THE OKAY.  *If you need a refill on your cardiac medications before your next appointment, please call your pharmacy*  Lab Work: NOT NEEDED Testing/Procedures: NOT NEEDED  Follow-Up: At [facility], you and your health needs are our priority.  As part of our continuing mission to provide you with exceptional heart care, we have created designated Provider Care Teams.  These Care Teams include your primary Cardiologist (physician) and Advanced Practice Providers (APPs -  Physician Assistants and Nurse Practitioners) who all work together to provide you with the care you need, when you need it.  Your next appointment:   12 month(s)  The format for your next appointment:   In Person  Provider:   Oswaldo Milian, MD  Other Instructions  cleared to have colonoscopy with [facility]    Signed, Donato Heinz, MD  02/28/2019 3:22 PM    [place]

## 2019-02-28 NOTE — Patient Instructions (Signed)
Medication Instructions:  RESTART ELIQUIS  ONCE THE GI  GIVE THE OKAY.  *If you need a refill on your cardiac medications before your next appointment, please call your pharmacy*  Lab Work: NOT NEEDED Testing/Procedures: NOT NEEDED  Follow-Up: At [facility], you and your health needs are our priority.  As part of our continuing mission to provide you with exceptional heart care, we have created designated Provider Care Teams.  These Care Teams include your primary Cardiologist (physician) and Advanced Practice Providers (APPs -  Physician Assistants and Nurse Practitioners) who all work together to provide you with the care you need, when you need it.  Your next appointment:   12 month(s)  The format for your next appointment:   In Person  Provider:   Oswaldo Milian, MD  Other Instructions  cleared to have colonoscopy with [facility]

## 2019-03-01 ENCOUNTER — Ambulatory Visit (AMBULATORY_SURGERY_CENTER): Payer: Medicare HMO | Admitting: Internal Medicine

## 2019-03-01 ENCOUNTER — Encounter: Payer: Self-pay | Admitting: Internal Medicine

## 2019-03-01 VITALS — BP 133/75 | HR 69 | Temp 98.0°F | Resp 14 | Ht 68.0 in | Wt 175.0 lb

## 2019-03-01 DIAGNOSIS — D124 Benign neoplasm of descending colon: Secondary | ICD-10-CM | POA: Diagnosis not present

## 2019-03-01 DIAGNOSIS — K635 Polyp of colon: Secondary | ICD-10-CM | POA: Diagnosis not present

## 2019-03-01 DIAGNOSIS — D125 Benign neoplasm of sigmoid colon: Secondary | ICD-10-CM

## 2019-03-01 DIAGNOSIS — D128 Benign neoplasm of rectum: Secondary | ICD-10-CM

## 2019-03-01 DIAGNOSIS — D126 Benign neoplasm of colon, unspecified: Secondary | ICD-10-CM

## 2019-03-01 DIAGNOSIS — D127 Benign neoplasm of rectosigmoid junction: Secondary | ICD-10-CM | POA: Diagnosis not present

## 2019-03-01 DIAGNOSIS — Z86010 Personal history of colonic polyps: Secondary | ICD-10-CM

## 2019-03-01 MED ORDER — SODIUM CHLORIDE 0.9 % IV SOLN: 500.0000 mL | Freq: Once | INTRAVENOUS | Status: DC

## 2019-03-01 NOTE — Patient Instructions (Addendum)
I took out 4 tiny polyps today - all look benign.  Will see what they were and decide if/when to repeat colonoscopy.  Restart Eliquis tomorrow.  I appreciate the opportunity to care for you. Gatha Mayer, MD, [facility]  HANDOUT given for polyps.  YOU HAD AN ENDOSCOPIC PROCEDURE TODAY AT [facility]:   Refer to the procedure report that was given to you for any specific questions about what was found during the examination.  If the procedure report does not answer your questions, please call your gastroenterologist to clarify.  If you requested that your care partner not be given the details of your procedure findings, then the procedure report has been included in a sealed envelope for you to review at your convenience later.  YOU SHOULD EXPECT: Some feelings of bloating in the abdomen. Passage of more gas than usual.  Walking can help get rid of the air that was put into your GI tract during the procedure and reduce the bloating. If you had a lower endoscopy (such as a colonoscopy or flexible sigmoidoscopy) you may notice spotting of blood in your stool or on the toilet paper. If you underwent a bowel prep for your procedure, you may not have a normal bowel movement for a few days.  Please Note:  You might notice some irritation and congestion in your nose or some drainage.  This is from the oxygen used during your procedure.  There is no need for concern and it should clear up in a day or so.  SYMPTOMS TO REPORT IMMEDIATELY:   Following lower endoscopy (colonoscopy or flexible sigmoidoscopy):  Excessive amounts of blood in the stool  Significant tenderness or worsening of abdominal pains  Swelling of the abdomen that is new, acute  Fever of 100F or higher  For urgent or emergent issues, a gastroenterologist can be reached at any hour by calling [phone].   DIET:  We do recommend a small meal at first, but then you may proceed to your regular diet.  Drink plenty of  fluids but you should avoid alcoholic beverages for 24 hours.  ACTIVITY:  You should plan to take it easy for the rest of today and you should NOT DRIVE or use heavy machinery until tomorrow (because of the sedation medicines used during the test).    FOLLOW UP: Our staff will call the number listed on your records 48-72 hours following your procedure to check on you and address any questions or concerns that you may have regarding the information given to you following your procedure. If we do not reach you, we will leave a message.  We will attempt to reach you two times.  During this call, we will ask if you have developed any symptoms of COVID 19. If you develop any symptoms (ie: fever, flu-like symptoms, shortness of breath, cough etc.) before then, please call [phone].  If you test positive for Covid 19 in the 2 weeks post procedure, please call and report this information to [place].    If any biopsies were taken you will be contacted by phone or by letter within the next 1-3 weeks.  Please call us at [phone] if you have not heard about the biopsies in 3 weeks.    SIGNATURES/CONFIDENTIALITY: You and/or your care partner have signed paperwork which will be entered into your electronic medical record.  These signatures attest to the fact that that the information above on your After Visit Summary has been reviewed  and is understood.  Full responsibility of the confidentiality of this discharge information lies with you and/or your care-partner.

## 2019-03-01 NOTE — Progress Notes (Signed)
PT taken to PACU. Monitors in place. VSS. Report given to RN. 

## 2019-03-01 NOTE — Progress Notes (Signed)
Called to room to assist during endoscopic procedure.  Patient ID and intended procedure confirmed with present staff. Received instructions for my participation in the procedure from the performing physician.  

## 2019-03-01 NOTE — Op Note (Signed)
[place] Patient Name: Timothy Wood Procedure Date: 03/01/2019 3:08 PM MRN: [id number] Endoscopist: Gatha Mayer , MD Age: 78 Referring MD:  Date of Birth: [birth year] Gender: Male Account #: [id number] Procedure:                Colonoscopy Indications:              Surveillance: History of numerous (> 10) adenomas                            on last colonoscopy (< 3 yrs) Medicines:                Propofol per Anesthesia, Monitored Anesthesia Care Procedure:                Pre-Anesthesia Assessment:                           - Prior to the procedure, a History and Physical                            was performed, and patient medications and                            allergies were reviewed. The patient's tolerance of                            previous anesthesia was also reviewed. The risks                            and benefits of the procedure and the sedation                            options and risks were discussed with the patient.                            All questions were answered, and informed consent                            was obtained. Prior Anticoagulants: The patient                            last took Eliquis (apixaban) 1 day prior to the                            procedure. ASA Grade Assessment: III - A patient                            with severe systemic disease. After reviewing the                            risks and benefits, the patient was deemed in                            satisfactory condition to undergo the procedure.  After obtaining informed consent, the colonoscope                            was passed under direct vision. Throughout the                            procedure, the patient's blood pressure, pulse, and                            oxygen saturations were monitored continuously. The                            Colonoscope was introduced through the anus and   advanced to the the cecum, identified by                            appendiceal orifice and ileocecal valve. The                            colonoscopy was performed without difficulty. The                            patient tolerated the procedure well. The quality                            of the bowel preparation was good. The bowel                            preparation used was Miralax via split dose                            instruction. The ileocecal valve, appendiceal                            orifice, and rectum were photographed. Scope In: 4:38:26 PM Scope Out: 4:53:06 PM Scope Withdrawal Time: 0 hours 12 minutes 31 seconds  Total Procedure Duration: 0 hours 14 minutes 40 seconds  Findings:                 The perianal and digital rectal examinations were                            normal.                           Four sessile polyps were found in the rectum,                            sigmoid colon and descending colon. The polyps were                            diminutive in size. These polyps were removed with                            a cold snare. Resection was complete,  but the polyp                            tissue was only partially retrieved. Verification                            of patient identification for the specimen was                            done. Estimated blood loss was minimal.                           A few small angiodysplastic lesions without                            bleeding were found in the cecum.                           The exam was otherwise without abnormality on                            direct and retroflexion views. Complications:            No immediate complications. Estimated Blood Loss:     Estimated blood loss was minimal. Impression:               - Four diminutive polyps in the rectum, in the                            sigmoid colon and in the descending colon, removed                            with a cold snare. Complete  resection. Partial                            retrieval.                           - A few non-bleeding colonic angiodysplastic                            lesions.                           - The examination was otherwise normal on direct                            and retroflexion views. Recommendation:           - Patient has a contact number available for                            emergencies. The signs and symptoms of potential                            delayed complications were discussed with the  patient. Return to normal activities tomorrow.                            Written discharge instructions were provided to the                            patient.                           - Resume previous diet.                           - Continue present medications.                           - Resume Eliquis (apixaban) at prior dose tomorrow.                           - Await pathology results.                           - No recommendation at this time regarding repeat                            colonoscopy due to age. Will likely have him come                            in for OV in 2 years and discuss possible                            colonoscopy but age making it more likely that                            routine repeat colonoscopy will not add to life                            expectancy. [place] area of decision making. Gatha Mayer, MD 03/01/2019 5:03:54 PM This report has been signed electronically.

## 2019-03-01 NOTE — Progress Notes (Signed)
Temp JR  VS DT  Pt's states no medical or surgical changes since previsit or office visit. Admitting RN reviewed with pt.

## 2019-03-03 ENCOUNTER — Telehealth: Payer: Self-pay

## 2019-03-03 NOTE — Telephone Encounter (Signed)
  Follow up Call-  Call back number 03/01/2019 11/23/2017  Post procedure Call Back phone  # [phone]  Permission to leave phone message Yes Yes  Some recent data might be hidden     Patient questions:  Do you have a fever, pain , or abdominal swelling? No. Pain Score  0 *  Have you tolerated food without any problems? Yes.    Have you been able to return to your normal activities? Yes.    Do you have any questions about your discharge instructions: Diet   No. Medications  No. Follow up visit  No.  Do you have questions or concerns about your Care? No.  Actions: * If pain score is 4 or above: No action needed, pain <4. 1. Have you developed a fever since your procedure? no  2.   Have you had an respiratory symptoms (SOB or cough) since your procedure? no  3.   Have you tested positive for COVID 19 since your procedure no  4.   Have you had any family members/close contacts diagnosed with the COVID 19 since your procedure?  no   If yes to any of these questions please route to Joylene John, RN and Alphonsa Gin, Therapist, sports.

## 2019-03-11 ENCOUNTER — Other Ambulatory Visit: Payer: Self-pay | Admitting: Family Medicine

## 2019-03-11 DIAGNOSIS — I10 Essential (primary) hypertension: Secondary | ICD-10-CM

## 2019-03-13 ENCOUNTER — Other Ambulatory Visit: Payer: Self-pay | Admitting: Family Medicine

## 2019-03-13 DIAGNOSIS — G8929 Other chronic pain: Secondary | ICD-10-CM

## 2019-03-13 DIAGNOSIS — K219 Gastro-esophageal reflux disease without esophagitis: Secondary | ICD-10-CM

## 2019-03-13 DIAGNOSIS — M545 Low back pain, unspecified: Secondary | ICD-10-CM

## 2019-03-16 ENCOUNTER — Encounter: Payer: Self-pay | Admitting: Internal Medicine

## 2019-04-17 ENCOUNTER — Other Ambulatory Visit: Payer: Self-pay

## 2019-04-18 ENCOUNTER — Encounter: Payer: Self-pay | Admitting: Family Medicine

## 2019-04-18 ENCOUNTER — Ambulatory Visit (INDEPENDENT_AMBULATORY_CARE_PROVIDER_SITE_OTHER): Payer: Medicare HMO | Admitting: Family Medicine

## 2019-04-18 VITALS — BP 118/70 | HR 95 | Temp 96.2°F | Resp 16 | Ht 68.0 in | Wt 177.0 lb

## 2019-04-18 DIAGNOSIS — L570 Actinic keratosis: Secondary | ICD-10-CM | POA: Diagnosis not present

## 2019-04-18 DIAGNOSIS — E559 Vitamin D deficiency, unspecified: Secondary | ICD-10-CM | POA: Diagnosis not present

## 2019-04-18 DIAGNOSIS — R69 Illness, unspecified: Secondary | ICD-10-CM | POA: Diagnosis not present

## 2019-04-18 DIAGNOSIS — I10 Essential (primary) hypertension: Secondary | ICD-10-CM

## 2019-04-18 DIAGNOSIS — J449 Chronic obstructive pulmonary disease, unspecified: Secondary | ICD-10-CM | POA: Diagnosis not present

## 2019-04-18 DIAGNOSIS — Z86711 Personal history of pulmonary embolism: Secondary | ICD-10-CM | POA: Diagnosis not present

## 2019-04-18 DIAGNOSIS — C3491 Malignant neoplasm of unspecified part of right bronchus or lung: Secondary | ICD-10-CM | POA: Diagnosis not present

## 2019-04-18 DIAGNOSIS — I2699 Other pulmonary embolism without acute cor pulmonale: Secondary | ICD-10-CM | POA: Diagnosis not present

## 2019-04-18 DIAGNOSIS — L820 Inflamed seborrheic keratosis: Secondary | ICD-10-CM | POA: Diagnosis not present

## 2019-04-18 DIAGNOSIS — E785 Hyperlipidemia, unspecified: Secondary | ICD-10-CM

## 2019-04-18 DIAGNOSIS — F324 Major depressive disorder, single episode, in partial remission: Secondary | ICD-10-CM

## 2019-04-18 MED ORDER — APIXABAN 5 MG PO TABS: 5.0000 mg | ORAL_TABLET | Freq: Two times a day (BID) | ORAL | 2 refills | Status: DC

## 2019-04-18 NOTE — Progress Notes (Signed)
HPI:   Timothy Wood is a 79 y.o. male, who is here today for chronic disease management.  He was last seen on 10/14/2018. No new problems since his last visit.   -Depression and anxiety: Currently he is not on medication. He took Celexa in the past. Otherwise he does not feel depressed except during holidays. Negative for suicidal thoughts.  He is excited about going with 8 friends [place] in [place].  -Hypertension: Currently he is on amlodipine 5 mg daily and irbesartan 150 mg daily. Checking BP at home, similar readings to BP today. Denies severe/frequent headache, visual changes, chest pain, dyspnea, palpitation, focal weakness, or edema.  Lab Results  Component Value Date   CREATININE 0.91 04/18/2018   BUN 10 04/18/2018   NA 143 04/18/2018   K 3.8 04/18/2018   CL 107 04/18/2018   CO2 27 04/18/2018    PE, currently he is on Eliquis 5 mg twice daily. He has not noted gum/nose bleeding, blood in the stool, or gross hematuria. No falls in the past year. Chronic low back pain, 6/10,sharp and non radiated. He is on Zanaflex.  He has unstable gait and tremor,stable. Has followed with neurologist.  He is not using assistance.  -Lung adenocarcinoma:Dx'ed in 2016, stage IA. He follows with oncologist, Dr Julien Nordmann. Still smoking, not willing to quit.  -He has some skin lesions cause irritation, he would like to have them "burn."  He has had procedure in the past and he has helped. Some mild pruritic. Negative for easy bleeding or significant changes.  -Vitamin D deficiency: He has not taking vitamin D supplementation for the past few weeks.  -Hyperlipidemia: Currently he is on Crestor 20 mg daily. Tolerating medication well.  Lab Results  Component Value Date   CHOL 142 04/18/2018   HDL 36 (L) 04/18/2018   LDLCALC 63 04/18/2018   LDLDIRECT 118.8 06/14/2006   TRIG 213 (H) 04/18/2018   CHOLHDL 4 10/15/2015    Review of Systems  Constitutional:  Positive for fatigue. Negative for activity change, appetite change and fever.  HENT: Negative for mouth sores, nosebleeds and sore throat.   Respiratory: Positive for cough (no more than his usual). Negative for wheezing.   Gastrointestinal: Negative for abdominal pain, nausea and vomiting.  Genitourinary: Negative for decreased urine volume, dysuria and hematuria.  Musculoskeletal: Positive for back pain and gait problem.  Neurological: Negative for syncope and facial asymmetry.  Psychiatric/Behavioral: Positive for sleep disturbance. Negative for confusion.  Rest of ROS, see pertinent positives sand negatives in HPI  Current Outpatient Medications on File Prior to Visit  Medication Sig Dispense Refill  . amLODipine (NORVASC) 5 MG tablet TAKE ONE TABLET BY MOUTH DAILY 90 tablet 1  . irbesartan (AVAPRO) 150 MG tablet TAKE ONE TABLET BY MOUTH DAILY 90 tablet 1  . omeprazole (PRILOSEC) 20 MG capsule TAKE ONE CAPSULE BY MOUTH TWICE A DAY BEFORE MEALS. 60 capsule 1  . rosuvastatin (CRESTOR) 20 MG tablet Take 1 tablet (20 mg total) by mouth daily. 90 tablet 3  . tiZANidine (ZANAFLEX) 2 MG tablet TAKE ONE TABLET BY MOUTH EVERY 12 HOURS AS NEEDED FOR MUSCLE SPASMS 60 tablet 1  . thiamine (VITAMIN B-1) 100 MG tablet Take 1 tablet (100 mg total) by mouth daily. (Patient not taking: Reported on 03/01/2019) 90 tablet 2   No current facility-administered medications on file prior to visit.     Past Medical History:  Diagnosis Date  . Adenocarcinoma of lung, stage  1 ([place])   . Anemia   . Angiodysplasia of cecum 11/23/2017   Multiple 1-2 mm  . Arthritis    back possibly  . Blood clotting disorder ([place])   . Cataract    removed bilaterally   . Chronic obstructive pulmonary disease (COPD) ([place])    "patient not aware"  . Clotting disorder (HCC)    PE LUNG, dvt leg   . Colon polyps   . Depression   . Diverticulosis   . DVT (deep venous thrombosis) ([place])    Post op  knee surgery  . DVT (deep  venous thrombosis) ([place])   . GERD (gastroesophageal reflux disease)   . Histoplasmosis   . Histoplasmosis with pneumonia ([place]) 04/01/2015  . Hypercholesteremia   . Hyperlipidemia   . Hypertension   . Laceration of head 08/05/2015  . Lumbar compression fracture ([place])   . Lung cancer ([place])   . Personal history of colonic polyps 2008   multiple adenomas 2008-2009 exams with large cecal adenoma  . Pneumonia    in history  . Polyposis coli - attenuated 09/12/2010   2008: adenomas with 9 polyps index and 2.5 cm cecal polyp removed over 3 exams, last 2009 with APC. diverticulosis also. 11/24/2010: 12 polyps removed - adenomas Anticipate routine repeat colonoscopy Spring 2013 Have recommended he see genetics counselor re ? Polyposis 03/27/2016 14 polyps removed max 15 mm   . Pulmonary nodule 06/2006   9 mm right upper obe pulmonary nodule (negative Pet Scan)  . PVD (peripheral vascular disease) ([place])    left leg s/p stent  . Sebaceous cyst    neck  . Seborrheic keratosis   . Seizures ([place])    4-5 yrs ago had seizures when in ICU- none since this time   . Sinusitis   . Tobacco abuse   . Tremor    Allergies  Allergen Reactions  . Codeine Anaphylaxis  . Hydrocodone Itching    Social History   Socioeconomic History  . Marital status: Widowed    Spouse name: Not on file  . Number of children: 2  . Years of education: Not on file  . Highest education level: Not on file  Occupational History  . Occupation: Retired    Fish farm manager: RETIRED  Tobacco Use  . Smoking status: Current Every Day Smoker    Packs/day: 1.00    Years: 60.00    Pack years: 60.00    Types: Cigarettes  . Smokeless tobacco: Never Used  Substance and Sexual Activity  . Alcohol use: Yes    Alcohol/week: 0.0 standard drinks    Comment: quit drinking after last hospitalization- may 2017  . Drug use: No  . Sexual activity: Not Currently  Other Topics Concern  . Not on file  Social History Narrative       Married for  over 50 years.  Wife died from lung cancer.     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     Occasional alcohol.      Tobacco use - over 50 pack years.   Father died at age 9 of suicide.   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   Social Determinants of Health   Financial Resource Strain:   . Difficulty of Paying Living Expenses: Not on file  Food Insecurity:   . Worried About Charity fundraiser in the Last Year: Not on file  . Ran Out of Food in the Last Year: Not on  file  Transportation Needs:   . Film/video editor (Medical): Not on file  . Lack of Transportation (Non-Medical): Not on file  Physical Activity:   . Days of Exercise per Week: Not on file  . Minutes of Exercise per Session: Not on file  Stress:   . Feeling of Stress : Not on file  Social Connections:   . Frequency of Communication with Friends and Family: Not on file  . Frequency of Social Gatherings with Friends and Family: Not on file  . Attends Religious Services: Not on file  . Active Member of Clubs or Organizations: Not on file  . Attends Archivist Meetings: Not on file  . Marital Status: Not on file    Vitals:   04/18/19 1154  BP: 118/70  Pulse: 95  Resp: 16  Temp: (!) 96.2 F (35.7 C)  SpO2: 97%   Body mass index is 26.91 kg/m.  Physical Exam  Nursing note and vitals reviewed. Constitutional: He is oriented to person, place, and time. He appears well-developed. No distress.  HENT:  Head: Normocephalic and atraumatic.    Mouth/Throat: Oropharynx is clear and moist and mucous membranes are normal.  Eyes: Pupils are equal, round, and reactive to light. Conjunctivae are normal.  Cardiovascular: Normal rate and regular rhythm.  No murmur heard. Pulses:      Dorsalis pedis pulses are 2+ on the right side and 2+ on the left side.  Respiratory: Effort normal and breath sounds normal. No respiratory distress.  GI: Soft. He exhibits no mass. There is  no hepatomegaly. There is no abdominal tenderness.  Musculoskeletal:        General: No edema.       Arms:       Back:  Lymphadenopathy:    He has no cervical adenopathy.  Neurological: He is alert and oriented to person, place, and time. He has normal strength. No cranial nerve deficit.  Mildly unstable gait, he is not using assistance.  Skin: Skin is warm. Lesion noted. No rash noted. No erythema.  Scattered hyperpigmented,round,raised lesions on back, 1.2 cm.Right fronto-parietal 3 mm. Regular borders, no tender. Scaly tick lesions left frontal and right forearm.  Psychiatric: He has a normal mood and affect.  Well groomed, good eye contact.    ASSESSMENT AND PLAN:  Mr. JESHUA RANSFORD was seen today for chronic disease management.  Orders Placed This Encounter  Procedures  . Comprehensive metabolic panel  . Lipid panel  . VITAMIN D 25 Hydroxy (Vit-D Deficiency, Fractures)    No problem-specific Assessment & Plan notes found for this encounter.  1. Seborrheic keratoses, inflamed Educated about Dx,prognosis,and treatment options. After verbal consent lesions that are causing irritation were treated with liquid nitrogen (2 on back and 1on forehead).  2. Actinic keratoses Educated about diagnosis. 2 lesions (forehead and forearm) treated with liquid nitrogen here is the office. UV protection strongly recommended. After verbal consent lesion on right forearm and forehead were treated with liquid nitrogen x3.  3. Essential hypertension BP adequately controlled. Continue monitoring BP regularly. Continue low-salt diet. No changes in current management.  - Comprehensive metabolic panel; Future  4. Hyperlipidemia, unspecified hyperlipidemia type Continue Crestor 20 mg daily. Low-fat diet also recommended. Lipid panel to be done with next blood test at his oncologist office.  - Comprehensive metabolic panel; Future - Lipid panel; Future  5. Vitamin D deficiency,  unspecified Further recommendation will be given according to 25 OH vitamin D results.  - Comprehensive  metabolic panel; Future - VITAMIN D 25 Hydroxy (Vit-D Deficiency, Fractures); Future  6. Adenocarcinoma of right lung [facility]) Following with oncologist. He is not interested in smoking cessation.  7. Other pulmonary embolism without acute cor pulmonale, unspecified chronicity (HCC) On Eliquis 5 mg twice daily. Tolerating medication well. We discussed some side effects.  8. Chronic obstructive pulmonary disease, unspecified COPD type ([place]) Problem has been stable without treatment. Strongly recommend smoking cessation.  9. Major depressive disorder with single episode, in partial remission ([place]) In general problem is a stable. He does not think he needs medication at this time.  Total lesions treated 5.Post procedure instructions given.  Strongly recommend having his friends tested for COVID-19 before gathering to go fishing and mask wearing.   Return in about 6 months (around 10/16/2019).   Jesper Stirewalt G. [name], MD  [facility]. [place] office.

## 2019-04-18 NOTE — Patient Instructions (Addendum)
A few things to remember from today's visit:   Seborrheic keratoses, inflamed  Actinic keratoses  Essential hypertension  Hyperlipidemia, unspecified hyperlipidemia type  Vitamin D deficiency, unspecified  Labs at your oncologist's office.  Areas treated today with liquid nitrogen will form blisters. Sun protection.  Please be sure medication list is accurate. If a new problem present, please set up appointment sooner than planned today.

## 2019-04-19 ENCOUNTER — Ambulatory Visit: Payer: Medicare HMO | Attending: Internal Medicine

## 2019-04-19 DIAGNOSIS — Z20822 Contact with and (suspected) exposure to covid-19: Secondary | ICD-10-CM

## 2019-04-20 ENCOUNTER — Telehealth: Payer: Self-pay | Admitting: Internal Medicine

## 2019-04-20 LAB — NOVEL CORONAVIRUS, NAA: SARS-CoV-2, NAA: NOT DETECTED

## 2019-04-20 NOTE — Telephone Encounter (Signed)
Rescheduled per 2/2 sch msg, pt req. Called and spoke with Sharyn Lull ([email or web address]), confirmed 3/12 and 3/15 appts

## 2019-04-21 ENCOUNTER — Other Ambulatory Visit: Payer: Self-pay | Admitting: Medical Oncology

## 2019-04-21 ENCOUNTER — Inpatient Hospital Stay: Payer: Medicare HMO

## 2019-04-21 DIAGNOSIS — C3490 Malignant neoplasm of unspecified part of unspecified bronchus or lung: Secondary | ICD-10-CM

## 2019-04-22 ENCOUNTER — Other Ambulatory Visit: Payer: Self-pay | Admitting: Family Medicine

## 2019-04-22 DIAGNOSIS — E785 Hyperlipidemia, unspecified: Secondary | ICD-10-CM

## 2019-04-24 ENCOUNTER — Inpatient Hospital Stay: Payer: Medicare HMO | Admitting: Internal Medicine

## 2019-05-19 DIAGNOSIS — Z7901 Long term (current) use of anticoagulants: Secondary | ICD-10-CM | POA: Diagnosis not present

## 2019-05-19 DIAGNOSIS — Z809 Family history of malignant neoplasm, unspecified: Secondary | ICD-10-CM | POA: Diagnosis not present

## 2019-05-19 DIAGNOSIS — J449 Chronic obstructive pulmonary disease, unspecified: Secondary | ICD-10-CM | POA: Diagnosis not present

## 2019-05-19 DIAGNOSIS — R69 Illness, unspecified: Secondary | ICD-10-CM | POA: Diagnosis not present

## 2019-05-19 DIAGNOSIS — E785 Hyperlipidemia, unspecified: Secondary | ICD-10-CM | POA: Diagnosis not present

## 2019-05-19 DIAGNOSIS — I10 Essential (primary) hypertension: Secondary | ICD-10-CM | POA: Diagnosis not present

## 2019-05-19 DIAGNOSIS — G8929 Other chronic pain: Secondary | ICD-10-CM | POA: Diagnosis not present

## 2019-05-19 DIAGNOSIS — K219 Gastro-esophageal reflux disease without esophagitis: Secondary | ICD-10-CM | POA: Diagnosis not present

## 2019-05-19 DIAGNOSIS — I2510 Atherosclerotic heart disease of native coronary artery without angina pectoris: Secondary | ICD-10-CM | POA: Diagnosis not present

## 2019-05-25 ENCOUNTER — Telehealth: Payer: Self-pay | Admitting: Family Medicine

## 2019-05-25 NOTE — Telephone Encounter (Signed)
Needs a refill on his Epipen. I did not see it in his medication history.  He said that he needs it because he his getting ready to go on a camping trip and he doesn't want to get stung by a bee and go into antigalactic shock.  Please advise

## 2019-05-26 ENCOUNTER — Other Ambulatory Visit: Payer: Self-pay | Admitting: Family Medicine

## 2019-05-26 ENCOUNTER — Inpatient Hospital Stay: Payer: Medicare HMO | Attending: Internal Medicine

## 2019-05-26 ENCOUNTER — Encounter (HOSPITAL_COMMUNITY): Payer: Self-pay

## 2019-05-26 ENCOUNTER — Other Ambulatory Visit: Payer: Self-pay

## 2019-05-26 ENCOUNTER — Ambulatory Visit (HOSPITAL_COMMUNITY)
Admission: RE | Admit: 2019-05-26 | Discharge: 2019-05-26 | Disposition: A | Discharge status: Home / Self Care | Payer: Medicare HMO | Source: Ambulatory Visit | Attending: Internal Medicine | Admitting: Internal Medicine

## 2019-05-26 DIAGNOSIS — I10 Essential (primary) hypertension: Secondary | ICD-10-CM | POA: Diagnosis not present

## 2019-05-26 DIAGNOSIS — C3411 Malignant neoplasm of upper lobe, right bronchus or lung: Secondary | ICD-10-CM | POA: Insufficient documentation

## 2019-05-26 DIAGNOSIS — J439 Emphysema, unspecified: Secondary | ICD-10-CM | POA: Diagnosis not present

## 2019-05-26 DIAGNOSIS — Z7901 Long term (current) use of anticoagulants: Secondary | ICD-10-CM | POA: Diagnosis not present

## 2019-05-26 DIAGNOSIS — Z86718 Personal history of other venous thrombosis and embolism: Secondary | ICD-10-CM | POA: Insufficient documentation

## 2019-05-26 DIAGNOSIS — Z885 Allergy status to narcotic agent status: Secondary | ICD-10-CM | POA: Insufficient documentation

## 2019-05-26 DIAGNOSIS — I700 Atherosclerosis of aorta: Secondary | ICD-10-CM | POA: Insufficient documentation

## 2019-05-26 DIAGNOSIS — Z86010 Personal history of colonic polyps: Secondary | ICD-10-CM | POA: Insufficient documentation

## 2019-05-26 DIAGNOSIS — C3490 Malignant neoplasm of unspecified part of unspecified bronchus or lung: Secondary | ICD-10-CM | POA: Insufficient documentation

## 2019-05-26 DIAGNOSIS — Z8719 Personal history of other diseases of the digestive system: Secondary | ICD-10-CM | POA: Diagnosis not present

## 2019-05-26 DIAGNOSIS — K219 Gastro-esophageal reflux disease without esophagitis: Secondary | ICD-10-CM | POA: Insufficient documentation

## 2019-05-26 DIAGNOSIS — M545 Low back pain: Secondary | ICD-10-CM | POA: Diagnosis not present

## 2019-05-26 DIAGNOSIS — E785 Hyperlipidemia, unspecified: Secondary | ICD-10-CM | POA: Diagnosis not present

## 2019-05-26 DIAGNOSIS — C3491 Malignant neoplasm of unspecified part of right bronchus or lung: Secondary | ICD-10-CM

## 2019-05-26 DIAGNOSIS — Z79899 Other long term (current) drug therapy: Secondary | ICD-10-CM | POA: Insufficient documentation

## 2019-05-26 LAB — CMP (CANCER CENTER ONLY)
ALT: 17 U/L (ref 0–44)
AST: 14 U/L — ABNORMAL LOW (ref 15–41)
Albumin: 4.2 g/dL (ref 3.5–5.0)
Alkaline Phosphatase: 93 U/L (ref 38–126)
Anion gap: 11 (ref 5–15)
BUN: 12 mg/dL (ref 8–23)
CO2: 24 mmol/L (ref 22–32)
Calcium: 9.3 mg/dL (ref 8.9–10.3)
Chloride: 106 mmol/L (ref 98–111)
Creatinine: 0.92 mg/dL (ref 0.61–1.24)
GFR, Est AFR Am: >60 mL/min (ref >60)
GFR, Estimated: >60 mL/min (ref >60)
Glucose, Bld: 111 mg/dL — ABNORMAL HIGH (ref 70–99)
Potassium: 4.2 mmol/L (ref 3.5–5.1)
Sodium: 141 mmol/L (ref 135–145)
Total Bilirubin: 0.7 mg/dL (ref 0.3–1.2)
Total Protein: 6.9 g/dL (ref 6.5–8.1)

## 2019-05-26 MED ORDER — SODIUM CHLORIDE (PF) 0.9 % IJ SOLN
INTRAMUSCULAR | Status: AC
  Filled 2019-05-26: qty 50

## 2019-05-26 MED ORDER — IOHEXOL 300 MG/ML  SOLN
75.0000 mL | Freq: Once | INTRAMUSCULAR | Status: AC | PRN
  Administered 2019-05-26: 75 mL via INTRAVENOUS

## 2019-05-26 MED ORDER — EPINEPHRINE 0.3 MG/0.3ML IJ SOAJ: INTRAMUSCULAR | 0 refills | Status: DC

## 2019-05-26 NOTE — Telephone Encounter (Signed)
Okay to send in Rx?

## 2019-05-26 NOTE — Telephone Encounter (Signed)
Rx sent. If he has to use Epipen for allergic reaction/anaphylaxis he also needs to go to the ER. Thanks, BJ

## 2019-05-29 ENCOUNTER — Encounter: Payer: Self-pay | Admitting: Internal Medicine

## 2019-05-29 ENCOUNTER — Telehealth: Payer: Self-pay | Admitting: Family Medicine

## 2019-05-29 ENCOUNTER — Other Ambulatory Visit: Payer: Self-pay

## 2019-05-29 ENCOUNTER — Encounter: Payer: Self-pay | Admitting: *Deleted

## 2019-05-29 ENCOUNTER — Inpatient Hospital Stay: Payer: Medicare HMO | Admitting: Internal Medicine

## 2019-05-29 DIAGNOSIS — Z7901 Long term (current) use of anticoagulants: Secondary | ICD-10-CM | POA: Diagnosis not present

## 2019-05-29 DIAGNOSIS — C3411 Malignant neoplasm of upper lobe, right bronchus or lung: Secondary | ICD-10-CM | POA: Diagnosis not present

## 2019-05-29 DIAGNOSIS — Z79899 Other long term (current) drug therapy: Secondary | ICD-10-CM | POA: Diagnosis not present

## 2019-05-29 DIAGNOSIS — J439 Emphysema, unspecified: Secondary | ICD-10-CM | POA: Diagnosis not present

## 2019-05-29 DIAGNOSIS — Z86010 Personal history of colonic polyps: Secondary | ICD-10-CM | POA: Diagnosis not present

## 2019-05-29 DIAGNOSIS — M545 Low back pain: Secondary | ICD-10-CM | POA: Diagnosis not present

## 2019-05-29 DIAGNOSIS — C3490 Malignant neoplasm of unspecified part of unspecified bronchus or lung: Secondary | ICD-10-CM

## 2019-05-29 DIAGNOSIS — I700 Atherosclerosis of aorta: Secondary | ICD-10-CM | POA: Diagnosis not present

## 2019-05-29 DIAGNOSIS — K219 Gastro-esophageal reflux disease without esophagitis: Secondary | ICD-10-CM | POA: Diagnosis not present

## 2019-05-29 DIAGNOSIS — I10 Essential (primary) hypertension: Secondary | ICD-10-CM | POA: Diagnosis not present

## 2019-05-29 DIAGNOSIS — E785 Hyperlipidemia, unspecified: Secondary | ICD-10-CM | POA: Diagnosis not present

## 2019-05-29 MED ORDER — EPINEPHRINE 0.3 MG/0.3ML IJ SOAJ: INTRAMUSCULAR | 0 refills | Status: DC

## 2019-05-29 NOTE — Telephone Encounter (Signed)
Rx has been sent in. 

## 2019-05-29 NOTE — Progress Notes (Signed)
[facility] Telephone:([phone]) [phone]   Fax:([phone]) [phone]  OFFICE PROGRESS NOTE  [name], [name], MD [place] [address]  DIAGNOSIS: Stage IA (T1a, N0, M0) non-small cell lung cancer, adenocarcinoma  diagnosed in April 2016.  PRIOR THERAPY: Status post right VATS with wedge resection of the right middle lobe as well as wedge resection of the right upper lobe and pleural biopsy under the care of [name] on 01/24/2015.  CURRENT THERAPY: Observation.  INTERVAL HISTORY: Timothy Wood 79 y.o. male returns to the clinic today for annual follow-up visit.  The patient is feeling fine today with no concerning complaints except for low back pain.  He denied having any chest pain, shortness of breath, cough or hemoptysis.  He denied having any fever or chills.  He has no nausea, vomiting, diarrhea or constipation.  He denied having any headache or visual changes.  The patient had repeat CT scan of the chest performed recently and he is here for evaluation and discussion of his scan results.  MEDICAL HISTORY: Past Medical History:  Diagnosis Date  . Adenocarcinoma of lung, stage 1 ([facility])   . Anemia   . Angiodysplasia of cecum 11/23/2017   Multiple 1-2 mm  . Arthritis    back possibly  . Blood clotting disorder ([facility])   . Cataract    removed bilaterally   . Chronic obstructive pulmonary disease (COPD) ([facility])    "patient not aware"  . Clotting disorder ([facility])    PE LUNG, dvt leg   . Colon polyps   . Depression   . Diverticulosis   . DVT (deep venous thrombosis) ([facility])    Post op  knee surgery  . DVT (deep venous thrombosis) ([facility])   . GERD (gastroesophageal reflux disease)   . Histoplasmosis   . Histoplasmosis with pneumonia ([facility]) 04/01/2015  . Hypercholesteremia   . Hyperlipidemia   . Hypertension   . Laceration of head 08/05/2015  . Lumbar compression fracture ([facility])   . Lung cancer ([facility]) dx'd 2016  . Personal history of colonic polyps  2008   multiple adenomas 2008-2009 exams with large cecal adenoma  . Pneumonia    in history  . Polyposis coli - attenuated 09/12/2010   2008: adenomas with 9 polyps index and 2.5 cm cecal polyp removed over 3 exams, last 2009 with APC. diverticulosis also. 11/24/2010: 12 polyps removed - adenomas Anticipate routine repeat colonoscopy Spring 2013 Have recommended he see genetics counselor re ? Polyposis 03/27/2016 14 polyps removed max 15 mm   . Pulmonary nodule 06/2006   9 mm right upper obe pulmonary nodule (negative Pet Scan)  . PVD (peripheral vascular disease) ([facility])    left leg s/p stent  . Sebaceous cyst    neck  . Seborrheic keratosis   . Seizures ([facility])    4-5 yrs ago had seizures when in ICU- none since this time   . Sinusitis   . Tobacco abuse   . Tremor     ALLERGIES:  is allergic to codeine and hydrocodone.  MEDICATIONS:  Current Outpatient Medications  Medication Sig Dispense Refill  . amLODipine (NORVASC) 5 MG tablet TAKE ONE TABLET BY MOUTH DAILY 90 tablet 1  . apixaban (ELIQUIS) 5 MG TABS tablet Take 1 tablet (5 mg total) by mouth 2 (two) times daily. 180 tablet 2  . EPINEPHrine (EPIPEN 2-PAK) 0.3 mg/0.3 mL IJ SOAJ injection To use daily if needed for severe allergy reaction. 1 each  0  . irbesartan (AVAPRO) 150 MG tablet TAKE ONE TABLET BY MOUTH DAILY 90 tablet 1  . omeprazole (PRILOSEC) 20 MG capsule TAKE ONE CAPSULE BY MOUTH TWICE A DAY BEFORE MEALS. 60 capsule 1  . rosuvastatin (CRESTOR) 20 MG tablet TAKE ONE TABLET BY MOUTH DAILY 90 tablet 2  . thiamine (VITAMIN B-1) 100 MG tablet Take 1 tablet (100 mg total) by mouth daily. (Patient not taking: Reported on 03/01/2019) 90 tablet 2  . tiZANidine (ZANAFLEX) 2 MG tablet TAKE ONE TABLET BY MOUTH EVERY 12 HOURS AS NEEDED FOR MUSCLE SPASMS 60 tablet 1   No current facility-administered medications for this visit.    SURGICAL HISTORY:  Past Surgical History:  Procedure Laterality Date  . ANGIOPLASTY / STENTING ILIAC   08/2008   Dr. Trula Slade  . BACK SURGERY  1975, 1978   x2 lumbar fusion over 30 years ago  . BACK SURGERY    . COLONOSCOPY  05/11/2006; 07/27/2006;05/05/2007, 11/24/2010   adenomas with 9 polyps index and 2.5 cm cecal polyp removed over 3 exams, last 2009 with APC. diverticulosis also.  2012: 12 adenomas removed, largest 8-10 mm, diverticulosis  . CORONARY ANGIOPLASTY WITH STENT PLACEMENT    . DESCENDING AORTIC ANEURYSM REPAIR W/ STENT  04/2010   Dr. Trula Slade  . ESOPHAGOGASTRODUODENOSCOPY  05/05/2007   GERD - one erosion  . EYE SURGERY Bilateral    cataract  . KNEE SURGERY  2002   left- cartilage   . LUNG BIOPSY    . PERCUTANEOUS PINNING Left 06/24/2012   Procedure: Repair Complex Laceration Left Thumb with percutaneous pinning.;  Surgeon: Dennie Bible, MD;  Location: [facility];  Service: Plastics;  Laterality: Left;  . THUMB AMPUTATION Left    pinning and index finger  . TONSILLECTOMY  1956  . TONSILLECTOMY    . UPPER GASTROINTESTINAL ENDOSCOPY    . VIDEO ASSISTED THORACOSCOPY (VATS)/WEDGE RESECTION Right 01/24/2015   Procedure: RIGHT VIDEO ASSISTED THORACOSCOPY (VATS)/WEDGE RESECTION;  Surgeon: [name], MD;  Location: [facility];  Service: Thoracic;  Laterality: Right;  . vocal cord polyps removed      REVIEW OF SYSTEMS:  A comprehensive review of systems was negative except for: Musculoskeletal: positive for back pain   PHYSICAL EXAMINATION: General appearance: alert, cooperative and no distress Head: Normocephalic, without obvious abnormality, atraumatic Neck: no adenopathy, no JVD, supple, symmetrical, trachea midline and thyroid not enlarged, symmetric, no tenderness/mass/nodules Lymph nodes: Cervical, supraclavicular, and axillary nodes normal. Resp: clear to auscultation bilaterally Back: symmetric, no curvature. ROM normal. No CVA tenderness. Cardio: regular rate and rhythm, S1, S2 normal, no murmur, click, rub or gallop GI: soft, non-tender; bowel sounds normal; no masses,   no organomegaly Extremities: extremities normal, atraumatic, no cyanosis or edema  ECOG PERFORMANCE STATUS: 1 - Symptomatic but completely ambulatory  Blood pressure 119/74, pulse 89, temperature 98 F (36.7 C), temperature source Oral, resp. rate 18, height 5\' 8"  (1.727 m), weight 174 lb 3.2 oz (79 kg), SpO2 97 %.  LABORATORY DATA: Lab Results  Component Value Date   WBC 5.7 04/18/2018   HGB 15.7 04/18/2018   HCT 46.4 04/18/2018   MCV 96.3 04/18/2018   PLT 175 04/18/2018      Chemistry      Component Value Date/Time   NA 141 05/26/2019 0840   NA 143 10/12/2016 0958   K 4.2 05/26/2019 0840   K 3.7 10/12/2016 0958   CL 106 05/26/2019 0840   CO2 24 05/26/2019 0840   CO2  26 10/12/2016 0958   BUN 12 05/26/2019 0840   BUN 17.8 10/12/2016 0958   CREATININE 0.92 05/26/2019 0840   CREATININE 1.2 10/12/2016 0958      Component Value Date/Time   CALCIUM 9.3 05/26/2019 0840   CALCIUM 9.2 10/12/2016 0958   ALKPHOS 93 05/26/2019 0840   ALKPHOS 92 10/12/2016 0958   AST 14 (L) 05/26/2019 0840   AST 16 10/12/2016 0958   ALT 17 05/26/2019 0840   ALT 16 10/12/2016 0958   BILITOT 0.7 05/26/2019 0840   BILITOT 0.76 10/12/2016 0958       RADIOGRAPHIC STUDIES: CT Chest W Contrast  Result Date: 05/26/2019 CLINICAL DATA:  Non-small-cell lung cancer. Restaging. EXAM: CT CHEST WITH CONTRAST TECHNIQUE: Multidetector CT imaging of the chest was performed during intravenous contrast administration. CONTRAST:  6mL OMNIPAQUE IOHEXOL 300 MG/ML  SOLN COMPARISON:  To 10/03/2018 FINDINGS: Cardiovascular: The heart size is normal. No substantial pericardial effusion. Coronary artery calcification is evident. Atherosclerotic calcification is noted in the wall of the thoracic aorta. Mediastinum/Nodes: No mediastinal lymphadenopathy. There is no hilar lymphadenopathy. The esophagus has normal imaging features. There is no axillary lymphadenopathy. Lungs/Pleura: Surgical staple line noted posterior right  upper lobe. Centrilobular emphsyema noted. No new suspicious pulmonary nodule or mass. No focal airspace consolidation. No pleural effusion. Upper Abdomen: Multiple cysts are noted in the left liver, stable. Tiny 7 mm left adrenal nodule is stable. Interval involution of an exophytic cyst noted previously upper pole right kidney with incomplete visualization of exophytic 2.3 cm low-density lesion upper pole right kidney also likely a cyst. Musculoskeletal: No worrisome lytic or sclerotic osseous abnormality. Mild superior endplate compression deformity upper thoracic vertebral body stable in the interval. IMPRESSION: Stable exam. No new or progressive findings to suggest recurrent or metastatic disease. Emphysema (ICD10-J43.9) and Aortic Atherosclerosis (ICD10-170.0) Electronically Signed   By: Misty Stanley M.D.   On: 05/26/2019 12:26    ASSESSMENT AND PLAN:  This is a very pleasant 79 years old white male with stage IA non-small cell lung cancer diagnosed in April 2016 and has been observation since that time. The patient is doing fine today with no concerning complaints except for the persistent low back pain after a fall several years ago. He had repeat CT scan of the chest performed recently.  I personally and independently reviewed the scans and discussed the results with the patient today. His scan showed no concerning findings for disease recurrence or metastasis. I recommended for him to continue on observation with repeat CT scan of the chest in 1 year. He was advised to call immediately if he has any concerning symptoms in the interval. The patient voices understanding of current disease status and treatment options and is in agreement with the current care plan. All questions were answered. The patient knows to call the clinic with any problems, questions or concerns. We can certainly see the patient much sooner if necessary.   Disclaimer: This note was dictated with voice recognition  software. Similar sounding words can inadvertently be transcribed and may not be corrected upon review.

## 2019-05-29 NOTE — Telephone Encounter (Signed)
Pt is requesting a prescription for epi pens be sent to his pharmacy [facility].

## 2019-05-29 NOTE — Patient Instructions (Signed)
Steps to Quit Smoking Smoking tobacco is the leading cause of preventable death. It can affect almost every organ in the body. Smoking puts you and people around you at risk for many serious, long-lasting (chronic) diseases. Quitting smoking can be hard, but it is one of the best things that you can do for your health. It is never too late to quit. How do I get ready to quit? When you decide to quit smoking, make a plan to help you succeed. Before you quit:  Pick a date to quit. Set a date within the next 2 weeks to give you time to prepare.  Write down the reasons why you are quitting. Keep this list in places where you will see it often.  Tell your family, friends, and co-workers that you are quitting. Their support is important.  Talk with your doctor about the choices that may help you quit.  Find out if your health insurance will pay for these treatments.  Know the people, places, things, and activities that make you want to smoke (triggers). Avoid them. What first steps can I take to quit smoking?  Throw away all cigarettes at home, at work, and in your car.  Throw away the things that you use when you smoke, such as ashtrays and lighters.  Clean your car. Make sure to empty the ashtray.  Clean your home, including curtains and carpets. What can I do to help me quit smoking? Talk with your doctor about taking medicines and seeing a counselor at the same time. You are more likely to succeed when you do both.  If you are pregnant or breastfeeding, talk with your doctor about counseling or other ways to quit smoking. Do not take medicine to help you quit smoking unless your doctor tells you to do so. To quit smoking: Quit right away  Quit smoking totally, instead of slowly cutting back on how much you smoke over a period of time.  Go to counseling. You are more likely to quit if you go to counseling sessions regularly. Take medicine You may take medicines to help you quit. Some  medicines need a prescription, and some you can buy over-the-counter. Some medicines may contain a drug called nicotine to replace the nicotine in cigarettes. Medicines may:  Help you to stop having the desire to smoke (cravings).  Help to stop the problems that come when you stop smoking (withdrawal symptoms). Your doctor may ask you to use:  Nicotine patches, gum, or lozenges.  Nicotine inhalers or sprays.  Non-nicotine medicine that is taken by mouth. Find resources Find resources and other ways to help you quit smoking and remain smoke-free after you quit. These resources are most helpful when you use them often. They include:  Online chats with a counselor.  Phone quitlines.  Printed self-help materials.  Support groups or group counseling.  Text messaging programs.  Mobile phone apps. Use apps on your mobile phone or tablet that can help you stick to your quit plan. There are many free apps for mobile phones and tablets as well as websites. Examples include Quit Guide from the CDC and smokefree.gov  What things can I do to make it easier to quit?   Talk to your family and friends. Ask them to support and encourage you.  Call a phone quitline (1-800-QUIT-NOW), reach out to support groups, or work with a counselor.  Ask people who smoke to not smoke around you.  Avoid places that make you want to smoke,   such as: ? Bars. ? Parties. ? Smoke-break areas at work.  Spend time with people who do not smoke.  Lower the stress in your life. Stress can make you want to smoke. Try these things to help your stress: ? Getting regular exercise. ? Doing deep-breathing exercises. ? Doing yoga. ? Meditating. ? Doing a body scan. To do this, close your eyes, focus on one area of your body at a time from head to toe. Notice which parts of your body are tense. Try to relax the muscles in those areas. How will I feel when I quit smoking? Day 1 to 3 weeks Within the first 24 hours,  you may start to have some problems that come from quitting tobacco. These problems are very bad 2-3 days after you quit, but they do not often last for more than 2-3 weeks. You may get these symptoms:  Mood swings.  Feeling restless, nervous, angry, or annoyed.  Trouble concentrating.  Dizziness.  Strong desire for high-sugar foods and nicotine.  Weight gain.  Trouble pooping (constipation).  Feeling like you may vomit (nausea).  Coughing or a sore throat.  Changes in how the medicines that you take for other issues work in your body.  Depression.  Trouble sleeping (insomnia). Week 3 and afterward After the first 2-3 weeks of quitting, you may start to notice more positive results, such as:  Better sense of smell and taste.  Less coughing and sore throat.  Slower heart rate.  Lower blood pressure.  Clearer skin.  Better breathing.  Fewer sick days. Quitting smoking can be hard. Do not give up if you fail the first time. Some people need to try a few times before they succeed. Do your best to stick to your quit plan, and talk with your doctor if you have any questions or concerns. Summary  Smoking tobacco is the leading cause of preventable death. Quitting smoking can be hard, but it is one of the best things that you can do for your health.  When you decide to quit smoking, make a plan to help you succeed.  Quit smoking right away, not slowly over a period of time.  When you start quitting, seek help from your doctor, family, or friends. This information is not intended to replace advice given to you by your health care provider. Make sure you discuss any questions you have with your health care provider. Document Revised: 11/25/2018 Document Reviewed: 05/21/2018 Elsevier Patient Education  2020 [employer]vier Inc.  

## 2019-05-31 ENCOUNTER — Telehealth: Payer: Self-pay | Admitting: Internal Medicine

## 2019-05-31 NOTE — Telephone Encounter (Signed)
Scheduled per los. Called and left msg. Mailed printout  °

## 2019-07-19 ENCOUNTER — Other Ambulatory Visit: Payer: Self-pay | Admitting: Family Medicine

## 2019-07-19 DIAGNOSIS — K219 Gastro-esophageal reflux disease without esophagitis: Secondary | ICD-10-CM

## 2019-07-22 ENCOUNTER — Other Ambulatory Visit: Payer: Self-pay | Admitting: Family Medicine

## 2019-07-22 DIAGNOSIS — M545 Low back pain, unspecified: Secondary | ICD-10-CM

## 2019-07-26 ENCOUNTER — Other Ambulatory Visit: Payer: Self-pay | Admitting: Family Medicine

## 2019-07-26 DIAGNOSIS — G8929 Other chronic pain: Secondary | ICD-10-CM

## 2019-07-26 DIAGNOSIS — M545 Low back pain, unspecified: Secondary | ICD-10-CM

## 2019-09-26 ENCOUNTER — Other Ambulatory Visit: Payer: Self-pay | Admitting: Family Medicine

## 2019-09-26 DIAGNOSIS — I10 Essential (primary) hypertension: Secondary | ICD-10-CM

## 2019-09-26 DIAGNOSIS — K219 Gastro-esophageal reflux disease without esophagitis: Secondary | ICD-10-CM

## 2019-10-06 ENCOUNTER — Other Ambulatory Visit: Payer: Self-pay | Admitting: Family Medicine

## 2019-10-06 DIAGNOSIS — I10 Essential (primary) hypertension: Secondary | ICD-10-CM

## 2019-10-16 ENCOUNTER — Ambulatory Visit: Payer: Medicare PPO | Admitting: Family Medicine

## 2019-10-16 ENCOUNTER — Encounter: Payer: Self-pay | Admitting: Family Medicine

## 2019-10-16 ENCOUNTER — Other Ambulatory Visit: Payer: Self-pay

## 2019-10-16 VITALS — BP 136/70 | HR 96 | Resp 16 | Ht 68.0 in | Wt 177.1 lb

## 2019-10-16 DIAGNOSIS — M545 Low back pain, unspecified: Secondary | ICD-10-CM

## 2019-10-16 DIAGNOSIS — I2699 Other pulmonary embolism without acute cor pulmonale: Secondary | ICD-10-CM | POA: Diagnosis not present

## 2019-10-16 DIAGNOSIS — G8929 Other chronic pain: Secondary | ICD-10-CM

## 2019-10-16 DIAGNOSIS — I10 Essential (primary) hypertension: Secondary | ICD-10-CM | POA: Diagnosis not present

## 2019-10-16 DIAGNOSIS — F3341 Major depressive disorder, recurrent, in partial remission: Secondary | ICD-10-CM | POA: Diagnosis not present

## 2019-10-16 DIAGNOSIS — S80869A Insect bite (nonvenomous), unspecified lower leg, initial encounter: Secondary | ICD-10-CM

## 2019-10-16 DIAGNOSIS — W57XXXA Bitten or stung by nonvenomous insect and other nonvenomous arthropods, initial encounter: Secondary | ICD-10-CM

## 2019-10-16 DIAGNOSIS — E559 Vitamin D deficiency, unspecified: Secondary | ICD-10-CM

## 2019-10-16 DIAGNOSIS — Z1159 Encounter for screening for other viral diseases: Secondary | ICD-10-CM | POA: Diagnosis not present

## 2019-10-16 DIAGNOSIS — E782 Mixed hyperlipidemia: Secondary | ICD-10-CM | POA: Diagnosis not present

## 2019-10-16 MED ORDER — DULOXETINE HCL 30 MG PO CPEP: 30.0000 mg | ORAL_CAPSULE | Freq: Every day | ORAL | 1 refills | Status: DC

## 2019-10-16 MED ORDER — MUPIROCIN 2 % EX OINT: 1.0000 "application " | TOPICAL_OINTMENT | Freq: Two times a day (BID) | CUTANEOUS | 0 refills | Status: AC

## 2019-10-16 MED ORDER — TRIAMCINOLONE ACETONIDE 0.1 % EX CREA: 1.0000 "application " | TOPICAL_CREAM | Freq: Two times a day (BID) | CUTANEOUS | 0 refills | Status: AC

## 2019-10-16 MED ORDER — TIZANIDINE HCL 2 MG PO TABS: ORAL_TABLET | ORAL | 3 refills | Status: DC

## 2019-10-16 NOTE — Patient Instructions (Signed)
A few things to remember from today's visit:   Vitamin D deficiency, unspecified - Plan: VITAMIN D 25 Hydroxy (Vit-D Deficiency, Fractures)  Essential hypertension - Plan: Basic metabolic panel  Chronic low back pain without sciatica, unspecified back pain laterality - Plan: tiZANidine (ZANAFLEX) 2 MG tablet, DULoxetine (CYMBALTA) 30 MG capsule, DG Lumbar Spine Complete  Encounter for HCV screening test for low risk patient - Plan: Hepatitis C antibody  Cymbalta 30 mg started today to help with back pain. Rest no changes. Fall precautions. Lower back X ray ordered.  If you need refills please call your pharmacy. Do not use My Chart to request refills or for acute issues that need immediate attention.    Please be sure medication list is accurate. If a new problem present, please set up appointment sooner than planned today.

## 2019-10-16 NOTE — Assessment & Plan Note (Signed)
Because history of lung adenocarcinoma, lumbar x-ray ordered today. After discussion of some side effects, he agrees with trying Cymbalta 30 mg daily. Continue Zanaflex 2 mg twice daily as needed, we discussed side effects. Fall precautions.

## 2019-10-16 NOTE — Assessment & Plan Note (Signed)
Tolerating Eliquis well. Some side effects reviewed. No changes in current management.

## 2019-10-16 NOTE — Assessment & Plan Note (Signed)
Further recommendation will be given according to 25 OH vitamin D result.

## 2019-10-16 NOTE — Assessment & Plan Note (Signed)
BP adequately controlled. No changes in current management. Continue low salt diet. 

## 2019-10-16 NOTE — Assessment & Plan Note (Signed)
Cymbalta 30 mg started today mainly for back pain. Some side effects discussed.

## 2019-10-16 NOTE — Progress Notes (Signed)
HPI: [name] is a 79 y.o. male, who is here today for chronic disease management.  He was last seen on 04/18/2019 Since his last visit he has seen his oncologist. He is now following annually. He continues to smoking and not interested in smoking cessation.  -Today he is complaining about bilateral lower back pain. Constant, sometimes worse, "bad.". Exacerbated by walking and alleviated by rest. He is not able to take care of his chart due to pain. He is not sure if pain is radiated. No history of recent trauma. He is currently on Zanaflex 2 mg twice daily as needed.  Tick bite x 2 same day about 3-4 months. Pruritic. Not tender or erythema. He has not noted rash,more fatigue than usual.  HTN: Currently he is on amlodipine 5 mg daily and it irbesartan 150 mg daily. He is not checking BP at home. Denies headache, chest pain, dyspnea, palpitation, focal weakness, or worsening edema. LE edema, alleviated by elevation. Exacerbated by prolonged walk. Negative for pain or erythema.  Lab Results  Component Value Date   CREATININE 0.92 05/26/2019   BUN 12 05/26/2019   NA 141 05/26/2019   K 4.2 05/26/2019   CL 106 05/26/2019   CO2 24 05/26/2019   HLD; he is on Crestor 20 mg daily. Today he had coffee with creamer and sugar. + CAD.  Lab Results  Component Value Date   CHOL 142 04/18/2018   HDL 36 (L) 04/18/2018   LDLCALC 63 04/18/2018   LDLDIRECT 118.8 06/14/2006   TRIG 213 (H) 04/18/2018   CHOLHDL 4 10/15/2015   Vit D deficiency: He did not take vit D supplementations. Recurrent PE on chronic anticoagulation. He is on Eliquis 5 mg bid. No side effects reported.  Depression: He is not on pharmacologic treatment. He has "good and bad" days. He lives alone, daughter lives close by and checks on him regularly.  Depression screen [facility] 2/9 10/16/2019 10/14/2018 05/15/2015 04/01/2015 09/19/2014  Decreased Interest 3 1 3  0 3  Down, Depressed, Hopeless 1 1 3 1 1    PHQ - 2 Score 4 2 6 1 4   Altered sleeping 3 3 3  - 0  Tired, decreased energy 1 1 3  - 3  Change in appetite 0 0 0 - 0  Feeling bad or failure about yourself  1 1 3  - 3  Trouble concentrating 0 1 3 - 3  Moving slowly or fidgety/restless 0 0 0 - 0  Suicidal thoughts 0 0 0 - 0  PHQ-9 Score 9 8 18  - 13  Difficult doing work/chores Somewhat difficult Not difficult at all Extremely dIfficult - -  Some recent data might be hidden    GAD 7 : Generalized Anxiety Score 10/16/2019  Nervous, Anxious, on Edge 0  Control/stop worrying 0  Worry too much - different things 0  Trouble relaxing 0  Restless 0  Easily annoyed or irritable 0  Afraid - awful might happen 0  Total GAD 7 Score 0  Anxiety Difficulty Not difficult at all   Review of Systems  Constitutional: Positive for fatigue (No more than usual). Negative for activity change, appetite change, fever and unexpected weight change.  HENT: Negative for mouth sores, nosebleeds and sore throat.   Eyes: Negative for redness and visual disturbance.  Respiratory: Negative for cough and wheezing.   Gastrointestinal: Negative for abdominal pain, nausea and vomiting.  Genitourinary: Negative for decreased urine volume, dysuria and hematuria.  Musculoskeletal: Positive for gait problem.  Neurological: Negative for dizziness, seizures, weakness, numbness and headaches.  Psychiatric/Behavioral: Positive for sleep disturbance. Negative for confusion and hallucinations.  Rest of ROS, see pertinent positives sand negatives in HPI  Current Outpatient Medications on File Prior to Visit  Medication Sig Dispense Refill   amLODipine (NORVASC) 5 MG tablet TAKE ONE TABLET BY MOUTH DAILY 90 tablet 1   apixaban (ELIQUIS) 5 MG TABS tablet Take 1 tablet (5 mg total) by mouth 2 (two) times daily. 180 tablet 2   EPINEPHrine (EPIPEN 2-PAK) 0.3 mg/0.3 mL IJ SOAJ injection To use daily if needed for severe allergy reaction. 1 each 0   irbesartan (AVAPRO) 150 MG  tablet TAKE ONE TABLET BY MOUTH DAILY 90 tablet 1   omeprazole (PRILOSEC) 20 MG capsule TAKE ONE CAPSULE BY MOUTH TWICE A DAY BEFORE MEALS 60 capsule 11   rosuvastatin (CRESTOR) 20 MG tablet TAKE ONE TABLET BY MOUTH DAILY 90 tablet 2   No current facility-administered medications on file prior to visit.     Past Medical History:  Diagnosis Date   Adenocarcinoma of lung, stage 1 ([place])    Anemia    Angiodysplasia of cecum 11/23/2017   Multiple 1-2 mm   Arthritis    back possibly   Blood clotting disorder (HCC)    Cataract    removed bilaterally    Chronic obstructive pulmonary disease (COPD) ([place])    "patient not aware"   Clotting disorder ([place])    PE LUNG, dvt leg    Colon polyps    Depression    Diverticulosis    DVT (deep venous thrombosis) ([place])    Post op  knee surgery   DVT (deep venous thrombosis) (HCC)    GERD (gastroesophageal reflux disease)    Histoplasmosis    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 diverticulosis also. 11/24/2010: 12 polyps removed - adenomas Anticipate routine repeat colonoscopy Spring 2013 Have recommended he see genetics counselor re ? Polyposis 03/27/2016 14 polyps removed max 15 mm    Pulmonary nodule 06/2006   9 mm right upper obe pulmonary nodule (negative Pet Scan)   PVD (peripheral vascular disease) (HCC)    left leg s/p stent   Sebaceous cyst    neck   Seborrheic keratosis    Seizures ([place])    4-5 yrs ago had seizures when in ICU- none since this time    Sinusitis     Tobacco abuse    Tremor    Allergies  Allergen Reactions   Codeine Anaphylaxis   Hydrocodone Itching    Social History   Socioeconomic History   Marital status: Widowed    Spouse name: Not on file   Number of children: 2   Years of education: Not on file   Highest education level: Not on file  Occupational History   Occupation: Retired    Fish farm manager: RETIRED  Tobacco Use   Smoking status: Current Every Day Smoker    Packs/day: 1.00    Years: 60.00    Pack  years: 60.00    Types: Cigarettes   Smokeless tobacco: Never Used  Vaping Use   Vaping Use: Never used  Substance and Sexual Activity   Alcohol use: Yes    Alcohol/week: 0.0 standard drinks    Comment: quit drinking after last hospitalization- may 2017   Drug use: No   Sexual activity: Not Currently  Other Topics Concern   Not on file  Social History Narrative       Married for over 50 years.  Wife died from lung cancer.     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     Occasional alcohol.      Tobacco use - over 50 pack years.   Father died at age 39 of suicide.   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   Social Determinants of Health   Financial Resource Strain:    Difficulty of Paying Living Expenses:   Food Insecurity:    Worried About Charity fundraiser in the Last Year:    Arboriculturist in the Last Year:   Transportation Needs:    Film/video editor (Medical):    Lack of Transportation (Non-Medical):   Physical Activity:    Days of Exercise per Week:    Minutes of Exercise per Session:   Stress:    Feeling of Stress :   Social Connections:    Frequency of Communication with Friends and Family:    Frequency of Social Gatherings with Friends and Family:    Attends Religious Services:    Active Member of Clubs or Organizations:    Attends Archivist Meetings:    Marital Status:    Vitals:   10/16/19 1000  BP: (!) 136/70   Pulse: 96  Resp: 16  SpO2: 95%   Body mass index is 26.93 kg/m.  Physical Exam Nursing note reviewed.  Constitutional:      General: He is not in acute distress.    Appearance: He is well-developed.  HENT:     Head: Normocephalic and atraumatic.     Mouth/Throat:     Mouth: Mucous membranes are moist.     Dentition: Has dentures.     Pharynx: Oropharynx is clear.  Eyes:     Conjunctiva/sclera: Conjunctivae normal.     Pupils: Pupils are equal, round, and reactive to light.  Cardiovascular:     Rate and Rhythm: Normal rate and regular rhythm.     Heart sounds: No murmur heard.      Comments: DP and PT present bilateral. Pulmonary:     Effort: Pulmonary effort is normal. No respiratory distress.     Breath sounds: Normal breath sounds.  Abdominal:     Palpations: Abdomen is soft. There is no hepatomegaly or mass.     Tenderness: There is no abdominal tenderness.  Musculoskeletal:     Right lower leg: 2+ Edema present.     Left lower leg: 2+ Edema present.     Comments: Peri ankle edema L>R. No calves pain.  Lymphadenopathy:     Cervical: No cervical adenopathy.  Skin:    General: Skin is warm.     Findings: Lesion present. No erythema.          Comments: Right 2 cm, left 1.5.Surrounded skin firm, no local heat. Scratching signs center of right thigh lesion. Not tender.   Neurological:     General: No focal deficit present.     Mental Status:  He is alert and oriented to person, place, and time.     Cranial Nerves: No cranial nerve deficit.     Comments: Unstable gait, not assistance.  Psychiatric:     Comments: Well groomed, good eye contact.     ASSESSMENT AND PLAN:  [name] was seen today for chronic disease management.  Orders Placed This Encounter  Procedures   DG Lumbar Spine Complete   Hepatitis C antibody   VITAMIN D 25 Hydroxy (Vit-D Deficiency, Fractures)   Basic metabolic panel    Encounter for HCV screening test for low  risk patient - Hepatitis C antibody; Future  Tick bite of lower leg, unspecified laterality, initial encounter Local reaction. I do not think oral abx is needed. Avoid scratching lesions. Topical abx x 7-10 days. Monitor for changes. Recommend topical steroid, small amount, bid as needed. Monitor for changes.  - triamcinolone cream (KENALOG) 0.1 %; Apply 1 application topically in the morning and at bedtime for 14 days.  Dispense: 30 g; Refill: 0 - mupirocin ointment (BACTROBAN) 2 %; Apply 1 application topically 2 (two) times daily for 14 days. On thigh lesions.  Dispense: 30 g; Refill: 0  Essential hypertension BP adequately controlled. No changes in current management. Continue low-salt diet.  Vitamin D deficiency, unspecified Further recommendation will be given according to 25 OH vitamin D result.  BACK PAIN, LUMBAR, CHRONIC Because history of lung adenocarcinoma, lumbar x-ray ordered today. After discussion of some side effects, he agrees with trying Cymbalta 30 mg daily. Continue Zanaflex 2 mg twice daily as needed, we discussed side effects. Fall precautions.  Hyperlipemia, mixed Today he is not fasting, so we will plan on checking lipid panel next visit. Continue Crestor 20 mg daily.  Hx of pulmonary emboli (HCC) Tolerating Eliquis well. Some side effects reviewed. No changes in current management.  Depression, major, in partial remission (HCC) Cymbalta 30 mg started today mainly for back pain. Some side effects discussed.    Return in about 4 months (around 02/21/2020) for Back pain and HTN. Needs AWV schedule.[name] Kitchen   Makeisha Jentsch G. [name], MD  [facility]. [place] office.  A few things to remember from today's visit:   Vitamin D deficiency, unspecified - Plan: VITAMIN D 25 Hydroxy (Vit-D Deficiency, Fractures)  Essential hypertension - Plan: Basic metabolic panel  Chronic low back pain without sciatica, unspecified back pain laterality - Plan:  tiZANidine (ZANAFLEX) 2 MG tablet, DULoxetine (CYMBALTA) 30 MG capsule, DG Lumbar Spine Complete  Encounter for HCV screening test for low risk patient - Plan: Hepatitis C antibody  Cymbalta 30 mg started today to help with back pain. Rest no changes. Fall precautions. Lower back X ray ordered.  If you need refills please call your pharmacy. Do not use My Chart to request refills or for acute issues that need immediate attention.    Please be sure medication list is accurate. If a new problem present, please set up appointment sooner than planned today.

## 2019-10-16 NOTE — Assessment & Plan Note (Signed)
Today he is not fasting, so we will plan on checking lipid panel next visit. Continue Crestor 20 mg daily.

## 2019-10-22 ENCOUNTER — Other Ambulatory Visit: Payer: Self-pay | Admitting: Family Medicine

## 2019-10-23 ENCOUNTER — Other Ambulatory Visit: Payer: Self-pay

## 2019-10-23 ENCOUNTER — Ambulatory Visit (INDEPENDENT_AMBULATORY_CARE_PROVIDER_SITE_OTHER)
Admission: RE | Admit: 2019-10-23 | Discharge: 2019-10-23 | Disposition: A | Discharge status: Home / Self Care | Payer: Medicare PPO | Source: Ambulatory Visit | Attending: Family Medicine | Admitting: Family Medicine

## 2019-10-23 DIAGNOSIS — M545 Low back pain, unspecified: Secondary | ICD-10-CM

## 2019-10-23 DIAGNOSIS — M5136 Other intervertebral disc degeneration, lumbar region: Secondary | ICD-10-CM | POA: Diagnosis not present

## 2019-10-23 DIAGNOSIS — M48061 Spinal stenosis, lumbar region without neurogenic claudication: Secondary | ICD-10-CM | POA: Diagnosis not present

## 2019-10-23 DIAGNOSIS — M4856XA Collapsed vertebra, not elsewhere classified, lumbar region, initial encounter for fracture: Secondary | ICD-10-CM | POA: Diagnosis not present

## 2019-10-23 DIAGNOSIS — G8929 Other chronic pain: Secondary | ICD-10-CM | POA: Diagnosis not present

## 2019-10-23 DIAGNOSIS — I700 Atherosclerosis of aorta: Secondary | ICD-10-CM | POA: Diagnosis not present

## 2019-12-13 ENCOUNTER — Other Ambulatory Visit: Payer: Self-pay | Admitting: Family Medicine

## 2019-12-13 DIAGNOSIS — M545 Low back pain, unspecified: Secondary | ICD-10-CM

## 2019-12-26 ENCOUNTER — Telehealth: Payer: Self-pay | Admitting: Family Medicine

## 2019-12-26 NOTE — Telephone Encounter (Signed)
Left message for patient to call back and schedule Medicare Annual Wellness Visit (AWV) either virtually or in office.  Last AWV 10/14/18; please schedule at anytime with [facility] Nurse Health Advisor 2.  This should be a 45 minute visit.

## 2020-03-10 NOTE — Progress Notes (Signed)
Cardiology Office Note:    Date:  03/14/2020   ID:  Timothy Wood, DOB 04-20-1940, MRN [id number]  PCP:  [name], [name], MD  Cardiologist:  Donato Heinz, MD  Electrophysiologist:  None   Referring MD: [name], [name], MD   Chief Complaint  Patient presents with  . Follow-up    Follow-up  . Edema    History of Present Illness:    Timothy Wood is a 79 y.o. male with a hx of lung cancer, adenomatous colon polyps, DVT/PE on Eliquis, COPD, tobacco use, seizures, hypertension, hyperlipidemia, PAD s/p iliac stenting in 2010, and descending aortic aneurysm status post repair in 2012 who presents for follow-up.  He was referred for preop evaluation prior to colonoscopy, initially seen on 02/28/2020.  Smokes 1 pack/day for over 60 years.    Since last clinic visit, he reports that he has been doing well. He denies any chest pain, dyspnea, syncope, or palpitations. Does report he has been having some lightheadedness if he stands too quickly. Has also been having some lower extremity edema. Forgot to take his BP meds this morning. Denies any pain in his legs with walking. Reports back pain limits his exercise. Continues to smoke about 1 pack/day, not interested in quitting.   Past Medical History:  Diagnosis Date  . Adenocarcinoma of lung, stage 1 ([place])   . Anemia   . Angiodysplasia of cecum 11/23/2017   Multiple 1-2 mm  . Arthritis    back possibly  . Blood clotting disorder ([place])   . Cataract    removed bilaterally   . Chronic obstructive pulmonary disease (COPD) ([place])    "patient not aware"  . Clotting disorder (HCC)    PE LUNG, dvt leg   . Colon polyps   . Depression   . Diverticulosis   . DVT (deep venous thrombosis) ([place])    Post op  knee surgery  . DVT (deep venous thrombosis) ([place])   . GERD (gastroesophageal reflux disease)   . Histoplasmosis   . Histoplasmosis with pneumonia ([place]) 04/01/2015  . Hypercholesteremia   . Hyperlipidemia   . Hypertension    . Laceration of head 08/05/2015  . Lumbar compression fracture ([place])   . Lung cancer ([place]) dx'd 2016  . Personal history of colonic polyps 2008   multiple adenomas 2008-2009 exams with large cecal adenoma  . Pneumonia    in history  . Polyposis coli - attenuated 09/12/2010   2008: adenomas with 9 polyps index and 2.5 cm cecal polyp removed over 3 exams, last 2009 with APC. diverticulosis also. 11/24/2010: 12 polyps removed - adenomas Anticipate routine repeat colonoscopy Spring 2013 Have recommended he see genetics counselor re ? Polyposis 03/27/2016 14 polyps removed max 15 mm   . Pulmonary nodule 06/2006   9 mm right upper obe pulmonary nodule (negative Pet Scan)  . PVD (peripheral vascular disease) ([place])    left leg s/p stent  . Sebaceous cyst    neck  . Seborrheic keratosis   . Seizures ([place])    4-5 yrs ago had seizures when in ICU- none since this time   . Sinusitis   . Tobacco abuse   . Tremor     Past Surgical History:  Procedure Laterality Date  . ANGIOPLASTY / STENTING ILIAC  08/2008   Dr. Trula Slade  . BACK SURGERY  1975, 1978   x2 lumbar fusion over 30 years ago  . BACK SURGERY    . COLONOSCOPY  05/11/2006; 07/27/2006;05/05/2007,  11/24/2010   adenomas with 9 polyps index and 2.5 cm cecal polyp removed over 3 exams, last 2009 with APC. diverticulosis also.  2012: 12 adenomas removed, largest 8-10 mm, diverticulosis  . CORONARY ANGIOPLASTY WITH STENT PLACEMENT    . DESCENDING AORTIC ANEURYSM REPAIR W/ STENT  04/2010   Dr. Trula Slade  . ESOPHAGOGASTRODUODENOSCOPY  05/05/2007   GERD - one erosion  . EYE SURGERY Bilateral    cataract  . KNEE SURGERY  2002   left- cartilage   . LUNG BIOPSY    . PERCUTANEOUS PINNING Left 06/24/2012   Procedure: Repair Complex Laceration Left Thumb with percutaneous pinning.;  Surgeon: Dennie Bible, MD;  Location: [facility];  Service: Plastics;  Laterality: Left;  . THUMB AMPUTATION Left    pinning and index finger  . TONSILLECTOMY  1956  .  TONSILLECTOMY    . UPPER GASTROINTESTINAL ENDOSCOPY    . VIDEO ASSISTED THORACOSCOPY (VATS)/WEDGE RESECTION Right 01/24/2015   Procedure: RIGHT VIDEO ASSISTED THORACOSCOPY (VATS)/WEDGE RESECTION;  Surgeon: [name], MD;  Location: [facility];  Service: Thoracic;  Laterality: Right;  . vocal cord polyps removed      Current Medications: Current Meds  Medication Sig  . amLODipine (NORVASC) 5 MG tablet TAKE ONE TABLET BY MOUTH DAILY  . apixaban (ELIQUIS) 5 MG TABS tablet Take 1 tablet (5 mg total) by mouth 2 (two) times daily.  . DULoxetine (CYMBALTA) 30 MG capsule TAKE ONE CAPSULE BY MOUTH DAILY  . EPINEPHrine (EPIPEN 2-PAK) 0.3 mg/0.3 mL IJ SOAJ injection To use daily if needed for severe allergy reaction.  . irbesartan (AVAPRO) 150 MG tablet TAKE ONE TABLET BY MOUTH DAILY  . omeprazole (PRILOSEC) 20 MG capsule TAKE ONE CAPSULE BY MOUTH TWICE A DAY BEFORE MEALS  . rosuvastatin (CRESTOR) 20 MG tablet TAKE ONE TABLET BY MOUTH DAILY  . tiZANidine (ZANAFLEX) 2 MG tablet 1 tab 2 times daily as needed.     Allergies:   Codeine and Hydrocodone   Social History   Socioeconomic History  . Marital status: Widowed    Spouse name: Not on file  . Number of children: 2  . Years of education: Not on file  . Highest education level: Not on file  Occupational History  . Occupation: Retired    Fish farm manager: RETIRED  Tobacco Use  . Smoking status: Current Every Day Smoker    Packs/day: 1.00    Years: 60.00    Pack years: 60.00    Types: Cigarettes  . Smokeless tobacco: Never Used  Vaping Use  . Vaping Use: Never used  Substance and Sexual Activity  . Alcohol use: Yes    Alcohol/week: 0.0 standard drinks    Comment: quit drinking after last hospitalization- may 2017  . Drug use: No  . Sexual activity: Not Currently  Other Topics Concern  . Not on file  Social History Narrative       Married for over 50 years.  Wife died from lung cancer.     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     Occasional alcohol.      Tobacco use - over 50 pack years.   Father died at age 31 of suicide.   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   Social Determinants of Health   Financial Resource Strain: Not on file  Food Insecurity: Not on file  Transportation Needs: Not on file  Physical Activity: Not on file  Stress: Not on  file  Social Connections: Not on file     Family History: The patient's family history includes Cancer (age of onset: 36) in his mother; Colon cancer (age of onset: 20) in his mother; Deep vein thrombosis in his mother; Diabetes in an other family member; Heart disease in his brother and brother; Lung cancer in his mother and son; Rheum arthritis in his daughter and sister; Uterine cancer in his mother. There is no history of Colon polyps, Stomach cancer, or Rectal cancer.  ROS:   Please see the history of present illness.     All other systems reviewed and are negative.  EKGs/Labs/Other Studies Reviewed:    The following studies were reviewed today:   EKG:  EKG is  ordered today.  The ekg ordered today demonstrates normal sinus rhythm, rate 70, PVC, no ST/T abnormalities  TTE 08/06/15: - Left ventricle: The cavity size was normal. Wall thickness was   normal. Systolic function was vigorous. The estimated ejection   fraction was in the range of 65% to 70%. Wall motion was normal;   there were no regional wall motion abnormalities. Doppler   parameters are consistent with abnormal left ventricular   relaxation (grade 1 diastolic dysfunction).  Recent Labs: 05/26/2019: ALT 17; BUN 12; Creatinine 0.92; Potassium 4.2; Sodium 141  Recent Lipid Panel    Component Value Date/Time   CHOL 142 04/18/2018 0000   TRIG 213 (H) 04/18/2018 0000   HDL 36 (L) 04/18/2018 0000   CHOLHDL 4 10/15/2015 0801   VLDL 22.6 10/15/2015 0801   LDLCALC 63 04/18/2018 0000   LDLDIRECT 118.8 06/14/2006 1144    Physical Exam:    VS:  BP  (!) 150/89 (BP Location: Right Arm, Patient Position: Sitting)   Pulse 70   Ht 5\' 8"  (1.727 m)   Wt 175 lb 6.4 oz (79.6 kg)   SpO2 96%   BMI 26.67 kg/m     Wt Readings from Last 3 Encounters:  03/14/20 175 lb 6.4 oz (79.6 kg)  10/16/19 177 lb 2 oz (80.3 kg)  05/29/19 174 lb 3.2 oz (79 kg)     GEN: Well nourished, well developed in no acute distress HEENT: Normal NECK: No JVD LYMPHATICS: No lymphadenopathy CARDIAC: RRR, no murmurs, rubs, gallops RESPIRATORY:  Clear to auscultation without rales, wheezing or rhonchi  ABDOMEN: Soft, non-tender, non-distended MUSCULOSKELETAL:  1+ edema; No deformity  SKIN: Warm and dry NEUROLOGIC:  Alert and oriented x 3 PSYCHIATRIC:  Normal affect   ASSESSMENT:    No diagnosis found. PLAN:    Lower extremity edema: Mild bilateral lower extremity edema. Will check echocardiogram to rule out structural heart disease. Check CMP, BNP. If work-up unremarkable, suspect due to amlodipine use.  Hypertension: On amlodipine, irbesartan. BP elevated in clinic today, but reports he did not take his meds this morning. Asked to check BP daily for next week and call with results.  PAD: Status post iliac artery stenting.  Continue apixaban, rosuvastatin  Hyperlipidemia: Goal LDL less than 70 given PAD.  On rosuvastatin 20 mg daily.  Last LDL 63 on 04/18/2018. Will check lipid panel  Tobacco use: Patient counseled on the risks of tobacco use and cessation strongly encouraged.  Patient is not interested in quitting smoking at this time  RTC in 3 months    Medication Adjustments/Labs and Tests Ordered: Current medicines are reviewed at length with the patient today.  Concerns regarding medicines are outlined above.  No orders of the defined types were placed in  this encounter.  No orders of the defined types were placed in this encounter.   There are no Patient Instructions on file for this visit.   Signed, Donato Heinz, MD  03/14/2020 1:56  PM    [facility]

## 2020-03-14 ENCOUNTER — Encounter: Payer: Self-pay | Admitting: Cardiology

## 2020-03-14 ENCOUNTER — Ambulatory Visit: Payer: Medicare PPO | Admitting: Cardiology

## 2020-03-14 ENCOUNTER — Other Ambulatory Visit: Payer: Self-pay

## 2020-03-14 VITALS — BP 150/89 | HR 70 | Ht 68.0 in | Wt 175.4 lb

## 2020-03-14 DIAGNOSIS — R600 Localized edema: Secondary | ICD-10-CM | POA: Diagnosis not present

## 2020-03-14 DIAGNOSIS — E785 Hyperlipidemia, unspecified: Secondary | ICD-10-CM

## 2020-03-14 DIAGNOSIS — I10 Essential (primary) hypertension: Secondary | ICD-10-CM

## 2020-03-14 DIAGNOSIS — R42 Dizziness and giddiness: Secondary | ICD-10-CM | POA: Diagnosis not present

## 2020-03-14 NOTE — Patient Instructions (Signed)
Medication Instructions:  Your physician recommends that you continue on your current medications as directed. Please refer to the Current Medication list given to you today.  *If you need a refill on your cardiac medications before your next appointment, please call your pharmacy*   Lab Work: CMET, BNP, Lipid today  If you have labs (blood work) drawn today and your tests are completely normal, you will receive your results only by:  [place] (if you have MyChart) OR  A paper copy in the mail If you have any lab test that is abnormal or we need to change your treatment, we will call you to review the results.   Testing/Procedures: Your physician has requested that you have an echocardiogram. Echocardiography is a painless test that uses sound waves to create images of your heart. It provides your doctor with information about the size and shape of your heart and how well your hearts chambers and valves are working. This procedure takes approximately one hour. There are no restrictions for this procedure.  This will be done at our [facility] location:  [place]: At [employer], you and your health needs are our priority.  As part of our continuing mission to provide you with exceptional heart care, we have created designated Provider Care Teams.  These Care Teams include your primary Cardiologist (physician) and Advanced Practice Providers (APPs -  Physician Assistants and Nurse Practitioners) who all work together to provide you with the care you need, when you need it.  We recommend signing up for the patient portal called "MyChart".  Sign up information is provided on this After Visit Summary.  MyChart is used to connect with patients for Virtual Visits (Telemedicine).  Patients are able to view lab/test results, encounter notes, upcoming appointments, etc.  Non-urgent messages can be sent to your provider as well.   To learn more about what  you can do with MyChart, go to [email or web address].    Your next appointment:   3 month(s)  The format for your next appointment:   In Person  Provider:   Oswaldo Milian, MD   Other Instructions Please check your blood pressure at home daily, write it down.  Call the office or send message via Mychart with the readings in 1 week for Dr. Gardiner Rhyme to review.

## 2020-03-15 LAB — LIPID PANEL
Chol/HDL Ratio: 3.6 ratio (ref 0.0–5.0)
Cholesterol, Total: 135 mg/dL (ref 100–199)
HDL: 38 mg/dL — ABNORMAL LOW (ref >39)
LDL Chol Calc (NIH): 59 mg/dL (ref 0–99)
Triglycerides: 234 mg/dL — ABNORMAL HIGH (ref 0–149)
VLDL Cholesterol Cal: 38 mg/dL (ref 5–40)

## 2020-03-15 LAB — COMPREHENSIVE METABOLIC PANEL
ALT: 13 IU/L (ref 0–44)
AST: 17 IU/L (ref 0–40)
Albumin/Globulin Ratio: 2.3 — ABNORMAL HIGH (ref 1.2–2.2)
Albumin: 4.6 g/dL (ref 3.7–4.7)
Alkaline Phosphatase: 80 IU/L (ref 44–121)
BUN/Creatinine Ratio: 10 (ref 10–24)
BUN: 10 mg/dL (ref 8–27)
Bilirubin Total: 0.4 mg/dL (ref 0.0–1.2)
CO2: 24 mmol/L (ref 20–29)
Calcium: 9.1 mg/dL (ref 8.6–10.2)
Chloride: 106 mmol/L (ref 96–106)
Creatinine, Ser: 0.96 mg/dL (ref 0.76–1.27)
GFR calc Af Amer: 87 mL/min/{1.73_m2} (ref >59)
GFR calc non Af Amer: 75 mL/min/{1.73_m2} (ref >59)
Globulin, Total: 2 g/dL (ref 1.5–4.5)
Glucose: 91 mg/dL (ref 65–99)
Potassium: 4.6 mmol/L (ref 3.5–5.2)
Sodium: 143 mmol/L (ref 134–144)
Total Protein: 6.6 g/dL (ref 6.0–8.5)

## 2020-03-15 LAB — BRAIN NATRIURETIC PEPTIDE: BNP: 30.1 pg/mL (ref 0.0–100.0)

## 2020-03-17 ENCOUNTER — Other Ambulatory Visit: Payer: Self-pay | Admitting: Family Medicine

## 2020-03-17 DIAGNOSIS — E785 Hyperlipidemia, unspecified: Secondary | ICD-10-CM

## 2020-03-26 ENCOUNTER — Telehealth: Payer: Self-pay | Admitting: Cardiology

## 2020-03-26 NOTE — Telephone Encounter (Signed)
Patient's BP Readings from request  136/84; 89 145/75; 95 173/91; 70 131/84; 88 114/65; 105 115/66; 81 132/72; 109 131/75; 79 129/71; 77

## 2020-03-26 NOTE — Telephone Encounter (Signed)
Has had some elevated BP values, would increase amlodipine to 10 mg daily.  Check BP daily for next 2 weeks and call with results.

## 2020-03-27 MED ORDER — AMLODIPINE BESYLATE 10 MG PO TABS: 10.0000 mg | ORAL_TABLET | Freq: Every day | ORAL | 3 refills | Status: DC

## 2020-03-27 NOTE — Telephone Encounter (Signed)
Spoke with pt, aware of dr schumann's recommendations. New script sent to the pharmacy

## 2020-04-05 ENCOUNTER — Ambulatory Visit (HOSPITAL_COMMUNITY): Payer: Medicare (Managed Care) | Attending: Cardiology

## 2020-04-05 ENCOUNTER — Other Ambulatory Visit: Payer: Self-pay

## 2020-04-05 DIAGNOSIS — R600 Localized edema: Secondary | ICD-10-CM | POA: Insufficient documentation

## 2020-04-05 DIAGNOSIS — I10 Essential (primary) hypertension: Secondary | ICD-10-CM | POA: Insufficient documentation

## 2020-04-05 DIAGNOSIS — R42 Dizziness and giddiness: Secondary | ICD-10-CM | POA: Diagnosis not present

## 2020-04-05 LAB — ECHOCARDIOGRAM COMPLETE
Area-P 1/2: 2.52 cm2
S' Lateral: 2.3 cm

## 2020-04-15 ENCOUNTER — Telehealth: Payer: Self-pay | Admitting: Cardiology

## 2020-04-15 NOTE — Telephone Encounter (Signed)
New message:     Patient calling to report BP   Am 128/72 pulse 76, 115/70/ pulse 81, 130/75 pulse 80, 130/78 pulse 76, 116/63 pulse 87, 115/69 pulse 72, 133/75 pulse 78, 148/64 pulse 98, 121/68 pulse 79, 121/65 pulse 80, 119/60 pulse 79, 142/70 pulse 73, 114/64 pulse 99, 104/64 pulse 101.      Pm 111/70 pulse 97, 102/62 pulse 81, 138/86 pulse 80, 141/90 p[ulse 89, 142/86 pulse 78, 149/74 pulse 84,155/86 pulse 69, 152/85 pulse 70, 146/77 pulse 90, 117/65 pulse 83, 123/75 pulse 73, 114/78 pulse 92, 126/60 pulse 104, 133/99 pulse 84,

## 2020-04-16 NOTE — Telephone Encounter (Signed)
BP looks OK, continue current meds

## 2020-04-18 ENCOUNTER — Ambulatory Visit: Payer: Medicare (Managed Care)

## 2020-04-18 NOTE — Progress Notes (Unsigned)
Subjective:   Timothy Wood is a 80 y.o. male who presents for Medicare Annual/Subsequent preventive examination.  Review of Systems    N/A        Objective:    There were no vitals filed for this visit. There is no height or weight on file to calculate BMI.  Advanced Directives 11/23/2017 04/21/2017 10/14/2016 04/15/2016 08/06/2015 08/05/2015 02/28/2015  Does Patient Have a Medical Advance Directive? No No No No No No No  Does patient want to make changes to medical advance directive? - - - - - - No - Patient declined  Would patient like information on creating a medical advance directive? - - - - No - patient declined information No - patient declined information -    Current Medications (verified) Outpatient Encounter Medications as of 04/18/2020  Medication Sig  . amLODipine (NORVASC) 10 MG tablet Take 1 tablet (10 mg total) by mouth daily.  [name] Kitchen apixaban (ELIQUIS) 5 MG TABS tablet Take 1 tablet (5 mg total) by mouth 2 (two) times daily.  . DULoxetine (CYMBALTA) 30 MG capsule TAKE ONE CAPSULE BY MOUTH DAILY  . EPINEPHrine (EPIPEN 2-PAK) 0.3 mg/0.3 mL IJ SOAJ injection To use daily if needed for severe allergy reaction.  . irbesartan (AVAPRO) 150 MG tablet TAKE ONE TABLET BY MOUTH DAILY  . omeprazole (PRILOSEC) 20 MG capsule TAKE ONE CAPSULE BY MOUTH TWICE A DAY BEFORE MEALS  . rosuvastatin (CRESTOR) 20 MG tablet TAKE ONE TABLET BY MOUTH DAILY  . tiZANidine (ZANAFLEX) 2 MG tablet 1 tab 2 times daily as needed.   No facility-administered encounter medications on file as of 04/18/2020.    Allergies (verified) Codeine and Hydrocodone   History: Past Medical History:  Diagnosis Date  . Adenocarcinoma of lung, stage 1 ([place])   . Anemia   . Angiodysplasia of cecum 11/23/2017   Multiple 1-2 mm  . Arthritis    back possibly  . Blood clotting disorder ([place])   . Cataract    removed bilaterally   . Chronic obstructive pulmonary disease (COPD) ([place])    "patient not aware"  . Clotting  disorder ([facility])    PE LUNG, dvt leg   . Colon polyps   . Depression   . Diverticulosis   . DVT (deep venous thrombosis) ([place])    Post op  knee surgery  . DVT (deep venous thrombosis) ([place])   . GERD (gastroesophageal reflux disease)   . Histoplasmosis   . Histoplasmosis with pneumonia ([place]) 04/01/2015  . Hypercholesteremia   . Hyperlipidemia   . Hypertension   . Laceration of head 08/05/2015  . Lumbar compression fracture ([place])   . Lung cancer ([place]) dx'd 2016  . Personal history of colonic polyps 2008   multiple adenomas 2008-2009 exams with large cecal adenoma  . Pneumonia    in history  . Polyposis coli - attenuated 09/12/2010   2008: adenomas with 9 polyps index and 2.5 cm cecal polyp removed over 3 exams, last 2009 with APC. diverticulosis also. 11/24/2010: 12 polyps removed - adenomas Anticipate routine repeat colonoscopy Spring 2013 Have recommended he see genetics counselor re ? Polyposis 03/27/2016 14 polyps removed max 15 mm   . Pulmonary nodule 06/2006   9 mm right upper obe pulmonary nodule (negative Pet Scan)  . PVD (peripheral vascular disease) ([place])    left leg s/p stent  . Sebaceous cyst    neck  . Seborrheic keratosis   . Seizures ([place])    4-5 yrs ago had  seizures when in ICU- none since this time   . Sinusitis   . Tobacco abuse   . Tremor    Past Surgical History:  Procedure Laterality Date  . ANGIOPLASTY / STENTING ILIAC  08/2008   Dr. Trula Slade  . BACK SURGERY  1975, 1978   x2 lumbar fusion over 30 years ago  . BACK SURGERY    . COLONOSCOPY  05/11/2006; 07/27/2006;05/05/2007, 11/24/2010   adenomas with 9 polyps index and 2.5 cm cecal polyp removed over 3 exams, last 2009 with APC. diverticulosis also.  2012: 12 adenomas removed, largest 8-10 mm, diverticulosis  . CORONARY ANGIOPLASTY WITH STENT PLACEMENT    . DESCENDING AORTIC ANEURYSM REPAIR W/ STENT  04/2010   Dr. Trula Slade  . ESOPHAGOGASTRODUODENOSCOPY  05/05/2007   GERD - one erosion  . EYE SURGERY Bilateral     cataract  . KNEE SURGERY  2002   left- cartilage   . LUNG BIOPSY    . PERCUTANEOUS PINNING Left 06/24/2012   Procedure: Repair Complex Laceration Left Thumb with percutaneous pinning.;  Surgeon: Dennie Bible, MD;  Location: [place];  Service: Plastics;  Laterality: Left;  . THUMB AMPUTATION Left    pinning and index finger  . TONSILLECTOMY  1956  . TONSILLECTOMY    . UPPER GASTROINTESTINAL ENDOSCOPY    . VIDEO ASSISTED THORACOSCOPY (VATS)/WEDGE RESECTION Right 01/24/2015   Procedure: RIGHT VIDEO ASSISTED THORACOSCOPY (VATS)/WEDGE RESECTION;  Surgeon: [name], MD;  Location: [facility];  Service: Thoracic;  Laterality: Right;  . vocal cord polyps removed     Family History  Problem Relation Age of Onset  . Colon cancer Mother 67       multiple colon polyps  . Deep vein thrombosis Mother   . Cancer Mother 65       uterine cancer  . Uterine cancer Mother   . Lung cancer Mother   . Lung cancer Son        smoked and was a Building control surveyor  . Heart disease Brother   . Diabetes Other   . Heart disease Brother   . Rheum arthritis Daughter   . Rheum arthritis Sister   . Colon polyps Neg Hx   . Stomach cancer Neg Hx   . Rectal cancer Neg Hx    Social History   Socioeconomic History  . Marital status: Widowed    Spouse name: Not on file  . Number of children: 2  . Years of education: Not on file  . Highest education level: Not on file  Occupational History  . Occupation: Retired    Fish farm manager: RETIRED  Tobacco Use  . Smoking status: Current Every Day Smoker    Packs/day: 1.00    Years: 60.00    Pack years: 60.00    Types: Cigarettes  . Smokeless tobacco: Never Used  Vaping Use  . Vaping Use: Never used  Substance and Sexual Activity  . Alcohol use: Yes    Alcohol/week: 0.0 standard drinks    Comment: quit drinking after last hospitalization- may 2017  . Drug use: No  . Sexual activity: Not Currently  Other Topics Concern  . Not on file  Social History Narrative        Married for over 50 years.  Wife died from lung cancer.     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     Occasional alcohol.      Tobacco use - over 50 pack years.  Father died at age 88 of suicide.   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   Social Determinants of Health   Financial Resource Strain: Not on file  Food Insecurity: Not on file  Transportation Needs: Not on file  Physical Activity: Not on file  Stress: Not on file  Social Connections: Not on file    Tobacco Counseling Ready to quit: Not Answered Counseling given: Not Answered   Clinical Intake:                 Diabetic?No          Activities of Daily Living In your present state of health, do you have any difficulty performing the following activities: 10/16/2019  Hearing? N  Vision? N  Difficulty concentrating or making decisions? N  Walking or climbing stairs? N  Dressing or bathing? N  Doing errands, shopping? N  Some recent data might be hidden    Patient Care Team: [name], [name], MD as PCP - General (Family Medicine) Donato Heinz, MD as PCP - Cardiology (Cardiology) Gatha Mayer, MD as Consulting Physician (Gastroenterology) Shawna Orleans, Doe-Hyun R, DO (Inactive)  Indicate any recent Medical Services you may have received from other than [facility] providers in the past year (date may be approximate).     Assessment:   This is a routine wellness examination for Melo.  Hearing/Vision screen No exam data present  Dietary issues and exercise activities discussed:    Goals   None    Depression Screen PHQ 2/9 Scores 10/16/2019 10/14/2018 05/15/2015 04/01/2015 09/19/2014 02/06/2013  PHQ - 2 Score 4 2 6 1 4 1   PHQ- 9 Score 9 8 18  - 13 -    Fall Risk Fall Risk  10/16/2019 10/14/2018 05/15/2015 04/01/2015 09/25/2014  Falls in the past year? 0 1 Yes No No  Number falls in past yr: - 0 2 or more - -  Injury with Fall? - 1 No - -  Risk for fall due to : - -  Impaired balance/gait;Medication side effect - -    FALL RISK PREVENTION PERTAINING TO THE HOME:  Any stairs in or around the home? {YES/NO:21197} If so, are there any without handrails? No  Home free of loose throw rugs in walkways, pet beds, electrical cords, etc? Yes  Adequate lighting in your home to reduce risk of falls? Yes   ASSISTIVE DEVICES UTILIZED TO PREVENT FALLS:  Life alert? {YES/NO:21197} Use of a cane, walker or w/c? {YES/NO:21197} Grab bars in the bathroom? {YES/NO:21197} Shower chair or bench in shower? {YES/NO:21197} Elevated toilet seat or a handicapped toilet? {YES/NO:21197}  TIMED UP AND GO:  Was the test performed? Yes .  Length of time to ambulate 10 feet: *** sec.   {Appearance of UXLK:4401027}  Cognitive Function:        Immunizations Immunization History  Administered Date(s) Administered  . Pneumococcal Conjugate-13 02/06/2013  . Pneumococcal Polysaccharide-23 08/01/2012  . Td 04/02/2009, 09/25/2011  . Tdap 09/09/2017  . Zoster 02/06/2013    TDAP status: Up to date  {Flu Vaccine status:2101806}  Pneumococcal vaccine status: Up to date  {Covid-19 vaccine status:2101808}  Qualifies for Shingles Vaccine? Yes   Zostavax completed Yes   Shingrix Completed?: No.    Education has been provided regarding the importance of this vaccine. Patient has been advised to call insurance company to determine out of pocket expense if they have not yet received this vaccine. Advised may also receive vaccine at local pharmacy or Health Dept.  Verbalized acceptance and understanding.  Screening Tests Health Maintenance  Topic Date Due  . Hepatitis C Screening  Never done  . COVID-19 Vaccine (1) Never done  . COLONOSCOPY (Pts 45-3yrs Insurance coverage will need to be confirmed)  02/29/2020  . TETANUS/TDAP  09/10/2027  . PNA vac Low Risk Adult  Completed  . INFLUENZA VACCINE  Discontinued    Health Maintenance  Health Maintenance Due  Topic Date  Due  . Hepatitis C Screening  Never done  . COVID-19 Vaccine (1) Never done  . COLONOSCOPY (Pts 45-50yrs Insurance coverage will need to be confirmed)  02/29/2020    Colorectal cancer screening: Type of screening: Colonoscopy. Completed 03/01/2019. Repeat every 2 years  Lung Cancer Screening: (Low Dose CT Chest recommended if Age 18-80 years, 30 pack-year currently smoking OR have quit w/in 15years.) does not qualify.   Lung Cancer Screening Referral: N/A   Additional Screening:  Hepatitis C Screening: does qualify; Completed   Vision Screening: Recommended annual ophthalmology exams for early detection of glaucoma and other disorders of the eye. Is the patient up to date with their annual eye exam?  {YES/NO:21197} Who is the provider or what is the name of the office in which the patient attends annual eye exams? *** If pt is not established with a provider, would they like to be referred to a provider to establish care? {YES/NO:21197}.   Dental Screening: Recommended annual dental exams for proper oral hygiene  Community Resource Referral / Chronic Care Management: CRR required this visit?  No   CCM required this visit?  No      Plan:     I have personally reviewed and noted the following in the patient's chart:   . Medical and social history . Use of alcohol, tobacco or illicit drugs  . Current medications and supplements . Functional ability and status . Nutritional status . Physical activity . Advanced directives . List of other physicians . Hospitalizations, surgeries, and ER visits in previous 12 months . Vitals . Screenings to include cognitive, depression, and falls . Referrals and appointments  In addition, I have reviewed and discussed with patient certain preventive protocols, quality metrics, and best practice recommendations. A written personalized care plan for preventive services as well as general preventive health recommendations were provided to  patient.     Ofilia Neas, LPN   10/21/3252   Nurse Notes: None

## 2020-04-19 NOTE — Telephone Encounter (Signed)
Patient aware and verbalized understanding. °

## 2020-04-29 ENCOUNTER — Other Ambulatory Visit: Payer: Self-pay | Admitting: Family Medicine

## 2020-04-29 DIAGNOSIS — M545 Low back pain, unspecified: Secondary | ICD-10-CM

## 2020-04-29 DIAGNOSIS — G8929 Other chronic pain: Secondary | ICD-10-CM

## 2020-05-06 NOTE — Progress Notes (Unsigned)
Subjective:   Timothy Wood is a 80 y.o. male who presents for Medicare Annual/Subsequent preventive examination.  Review of Systems    N/A  Cardiac Risk Factors include: advanced age (>39men, >74 women);hypertension;smoking/ tobacco exposure;male gender     Objective:    Today's Vitals   05/08/20 1418 05/08/20 1419  BP: 116/60   Pulse: (!) 105   Temp: 97.8 F (36.6 C)   TempSrc: Oral   SpO2: 92%   Weight: 171 lb 2 oz (77.6 kg)   Height: 5\' 8"  (1.727 m)   PainSc:  2    Body mass index is 26.02 kg/m.  Advanced Directives 05/08/2020 11/23/2017 04/21/2017 10/14/2016 04/15/2016 08/06/2015 08/05/2015  Does Patient Have a Medical Advance Directive? No No No No No No No  Does patient want to make changes to medical advance directive? - - - - - - -  Would patient like information on creating a medical advance directive? Yes (MAU/Ambulatory/Procedural Areas - Information given) - - - - No - patient declined information No - patient declined information    Current Medications (verified) Outpatient Encounter Medications as of 05/08/2020  Medication Sig  . amLODipine (NORVASC) 10 MG tablet Take 1 tablet (10 mg total) by mouth daily.  [name] Kitchen apixaban (ELIQUIS) 5 MG TABS tablet Take 1 tablet (5 mg total) by mouth 2 (two) times daily.  . DULoxetine (CYMBALTA) 30 MG capsule TAKE ONE CAPSULE BY MOUTH DAILY  . irbesartan (AVAPRO) 150 MG tablet TAKE ONE TABLET BY MOUTH DAILY  . omeprazole (PRILOSEC) 20 MG capsule TAKE ONE CAPSULE BY MOUTH TWICE A DAY BEFORE MEALS  . rosuvastatin (CRESTOR) 20 MG tablet TAKE ONE TABLET BY MOUTH DAILY  . EPINEPHrine (EPIPEN 2-PAK) 0.3 mg/0.3 mL IJ SOAJ injection To use daily if needed for severe allergy reaction. (Patient not taking: Reported on 05/08/2020)  . tiZANidine (ZANAFLEX) 2 MG tablet 1 tab 2 times daily as needed. (Patient not taking: Reported on 05/08/2020)   No facility-administered encounter medications on file as of 05/08/2020.    Allergies  (verified) Codeine and Hydrocodone   History: Past Medical History:  Diagnosis Date  . Adenocarcinoma of lung, stage 1 ([place])   . Anemia   . Angiodysplasia of cecum 11/23/2017   Multiple 1-2 mm  . Arthritis    back possibly  . Blood clotting disorder ([place])   . Cataract    removed bilaterally   . Chronic obstructive pulmonary disease (COPD) ([place])    "patient not aware"  . Clotting disorder (HCC)    PE LUNG, dvt leg   . Colon polyps   . Depression   . Diverticulosis   . DVT (deep venous thrombosis) ([place])    Post op  knee surgery  . DVT (deep venous thrombosis) ([place])   . GERD (gastroesophageal reflux disease)   . Histoplasmosis   . Histoplasmosis with pneumonia ([place]) 04/01/2015  . Hypercholesteremia   . Hyperlipidemia   . Hypertension   . Laceration of head 08/05/2015  . Lumbar compression fracture ([place])   . Lung cancer ([place]) dx'd 2016  . Personal history of colonic polyps 2008   multiple adenomas 2008-2009 exams with large cecal adenoma  . Pneumonia    in history  . Polyposis coli - attenuated 09/12/2010   2008: adenomas with 9 polyps index and 2.5 cm cecal polyp removed over 3 exams, last 2009 with APC. diverticulosis also. 11/24/2010: 12 polyps removed - adenomas Anticipate routine repeat colonoscopy Spring 2013 Have recommended he see genetics counselor  re ? Polyposis 03/27/2016 14 polyps removed max 15 mm   . Pulmonary nodule 06/2006   9 mm right upper obe pulmonary nodule (negative Pet Scan)  . PVD (peripheral vascular disease) ([place])    left leg s/p stent  . Sebaceous cyst    neck  . Seborrheic keratosis   . Seizures ([place])    4-5 yrs ago had seizures when in ICU- none since this time   . Sinusitis   . Tobacco abuse   . Tremor    Past Surgical History:  Procedure Laterality Date  . ANGIOPLASTY / STENTING ILIAC  08/2008   Dr. Trula Slade  . BACK SURGERY  1975, 1978   x2 lumbar fusion over 30 years ago  . BACK SURGERY    . COLONOSCOPY  05/11/2006; 07/27/2006;05/05/2007,  11/24/2010   adenomas with 9 polyps index and 2.5 cm cecal polyp removed over 3 exams, last 2009 with APC. diverticulosis also.  2012: 12 adenomas removed, largest 8-10 mm, diverticulosis  . CORONARY ANGIOPLASTY WITH STENT PLACEMENT    . DESCENDING AORTIC ANEURYSM REPAIR W/ STENT  04/2010   Dr. Trula Slade  . ESOPHAGOGASTRODUODENOSCOPY  05/05/2007   GERD - one erosion  . EYE SURGERY Bilateral    cataract  . KNEE SURGERY  2002   left- cartilage   . LUNG BIOPSY    . PERCUTANEOUS PINNING Left 06/24/2012   Procedure: Repair Complex Laceration Left Thumb with percutaneous pinning.;  Surgeon: Dennie Bible, MD;  Location: [facility];  Service: Plastics;  Laterality: Left;  . THUMB AMPUTATION Left    pinning and index finger  . TONSILLECTOMY  1956  . TONSILLECTOMY    . UPPER GASTROINTESTINAL ENDOSCOPY    . VIDEO ASSISTED THORACOSCOPY (VATS)/WEDGE RESECTION Right 01/24/2015   Procedure: RIGHT VIDEO ASSISTED THORACOSCOPY (VATS)/WEDGE RESECTION;  Surgeon: [name], MD;  Location: [facility];  Service: Thoracic;  Laterality: Right;  . vocal cord polyps removed     Family History  Problem Relation Age of Onset  . Colon cancer Mother 71       multiple colon polyps  . Deep vein thrombosis Mother   . Cancer Mother 29       uterine cancer  . Uterine cancer Mother   . Lung cancer Mother   . Lung cancer Son        smoked and was a Building control surveyor  . Heart disease Brother   . Diabetes Other   . Heart disease Brother   . Rheum arthritis Daughter   . Rheum arthritis Sister   . Colon polyps Neg Hx   . Stomach cancer Neg Hx   . Rectal cancer Neg Hx    Social History   Socioeconomic History  . Marital status: Widowed    Spouse name: Not on file  . Number of children: 2  . Years of education: Not on file  . Highest education level: Not on file  Occupational History  . Occupation: Retired    Fish farm manager: RETIRED  Tobacco Use  . Smoking status: Current Every Day Smoker    Packs/day: 1.00    Years:  60.00    Pack years: 60.00    Types: Cigarettes  . Smokeless tobacco: Never Used  Vaping Use  . Vaping Use: Never used  Substance and Sexual Activity  . Alcohol use: Yes    Alcohol/week: 0.0 standard drinks    Comment: quit drinking after last hospitalization- may 2017  . Drug use: No  . Sexual activity: Not Currently  Other Topics Concern  . Not on file  Social History Narrative       Married for over 50 years.  Wife died from lung cancer.     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     Occasional alcohol.      Tobacco use - over 50 pack years.   Father died at age 67 of suicide.   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   Social Determinants of Health   Financial Resource Strain: Low Risk   . Difficulty of Paying Living Expenses: Not hard at all  Food Insecurity: No Food Insecurity  . Worried About Charity fundraiser in the Last Year: Never true  . Ran Out of Food in the Last Year: Never true  Transportation Needs: No Transportation Needs  . Lack of Transportation (Medical): No  . Lack of Transportation (Non-Medical): No  Physical Activity: Inactive  . Days of Exercise per Week: 0 days  . Minutes of Exercise per Session: 0 min  Stress: No Stress Concern Present  . Feeling of Stress : Not at all  Social Connections: Socially Isolated  . Frequency of Communication with Friends and Family: More than three times a week  . Frequency of Social Gatherings with Friends and Family: More than three times a week  . Attends Religious Services: Never  . Active Member of Clubs or Organizations: No  . Attends Archivist Meetings: Never  . Marital Status: Widowed    Tobacco Counseling Ready to quit: Not Answered Counseling given: Not Answered   Clinical Intake:  Pre-visit preparation completed: Yes  Pain : 0-10 Pain Score: 2  Pain Type: Chronic pain Pain Location: Back Pain Orientation: Lower Pain Descriptors / Indicators:  Aching Pain Onset: More than a month ago Pain Frequency: Constant Pain Relieving Factors: Resting  Pain Relieving Factors: Resting  Nutritional Risks: None Diabetes: No  How often do you need to have someone help you when you read instructions, pamphlets, or other written materials from your doctor or pharmacy?: 1 - Never What is the last grade level you completed in school?: High school  Diabetic?No   Interpreter Needed?: No  Information entered by :: [place] of Daily Living In your present state of health, do you have any difficulty performing the following activities: 05/08/2020 10/16/2019  Hearing? Y N  Comment has tinnitus -  Vision? N N  Difficulty concentrating or making decisions? N N  Walking or climbing stairs? Y N  Comment has to climb stairs slowly -  Dressing or bathing? N N  Doing errands, shopping? N N  Preparing Food and eating ? N -  Using the Toilet? N -  In the past six months, have you accidently leaked urine? N -  Do you have problems with loss of bowel control? N -  Managing your Medications? N -  Managing your Finances? Y -  Comment daughter manages fiances -  Housekeeping or managing your Housekeeping? N -  Some recent data might be hidden    Patient Care Team: [name], [name], MD as PCP - General (Family Medicine) Donato Heinz, MD as PCP - Cardiology (Cardiology) Gatha Mayer, MD as Consulting Physician (Gastroenterology) Shawna Orleans, Doe-Hyun R, DO (Inactive)  Indicate any recent Medical Services you may have received from other than [facility] providers in the past year (date may be approximate).     Assessment:   This is a routine wellness  examination for [facility].  Hearing/Vision screen  Hearing Screening   125Hz  250Hz  500Hz  1000Hz  2000Hz  3000Hz  4000Hz  6000Hz  8000Hz   Right ear:           Left ear:           Vision Screening Comments: Gets eye exams once per year. Has hx of bilateral cataract extractions  Dietary  issues and exercise activities discussed: Current Exercise Habits: The patient does not participate in regular exercise at present, Exercise limited by: orthopedic condition(s);respiratory conditions(s)  Goals    . Weight (lb) < 165 lb (74.8 kg)      Depression Screen PHQ 2/9 Scores 05/08/2020 10/16/2019 10/14/2018 05/15/2015 04/01/2015 09/19/2014 02/06/2013  PHQ - 2 Score 2 4 2 6 1 4 1   PHQ- 9 Score 6 9 8 18  - 13 -    Fall Risk Fall Risk  05/08/2020 10/16/2019 10/14/2018 05/15/2015 04/01/2015  Falls in the past year? 0 0 1 Yes No  Number falls in past yr: 0 - 0 2 or more -  Injury with Fall? 0 - 1 No -  Risk for fall due to : No Fall Risks - - Impaired balance/gait;Medication side effect -  Follow up Falls evaluation completed - - - -    FALL RISK PREVENTION PERTAINING TO THE HOME:  Any stairs in or around the home? No  If so, are there any without handrails? No  Home free of loose throw rugs in walkways, pet beds, electrical cords, etc? Yes  Adequate lighting in your home to reduce risk of falls? Yes   ASSISTIVE DEVICES UTILIZED TO PREVENT FALLS:  Life alert? No  Use of a cane, walker or w/c? No  Grab bars in the bathroom? No  Shower chair or bench in shower? No  Elevated toilet seat or a handicapped toilet? No   TIMED UP AND GO:  Was the test performed? Yes .  Length of time to ambulate 10 feet: 5 sec.   Gait steady and fast without use of assistive device  Cognitive Function:     Normal cognitive status assessed by direct observation by this Nurse Health Advisor. No abnormalities found.      Immunizations Immunization History  Administered Date(s) Administered  . Pneumococcal Conjugate-13 02/06/2013  . Pneumococcal Polysaccharide-23 08/01/2012  . Td 04/02/2009, 09/25/2011  . Tdap 09/09/2017  . Zoster 02/06/2013    TDAP status: Up to date  Flu Vaccine status: Declined, Education has been provided regarding the importance of this vaccine but patient still declined.  Advised may receive this vaccine at local pharmacy or Health Dept. Aware to provide a copy of the vaccination record if obtained from local pharmacy or Health Dept. Verbalized acceptance and understanding.  Pneumococcal vaccine status: Up to date  Covid-19 vaccine status: Completed vaccines  Qualifies for Shingles Vaccine? Yes   Zostavax completed Yes   Shingrix Completed?: No.    Education has been provided regarding the importance of this vaccine. Patient has been advised to call insurance company to determine out of pocket expense if they have not yet received this vaccine. Advised may also receive vaccine at local pharmacy or Health Dept. Verbalized acceptance and understanding.  Screening Tests Health Maintenance  Topic Date Due  . Hepatitis C Screening  Never done  . COVID-19 Vaccine (1) Never done  . COLONOSCOPY (Pts 45-25yrs Insurance coverage will need to be confirmed)  02/29/2020  . TETANUS/TDAP  09/10/2027  . PNA vac Low Risk Adult  Completed  . INFLUENZA VACCINE  Discontinued    Health Maintenance  Health Maintenance Due  Topic Date Due  . Hepatitis C Screening  Never done  . COVID-19 Vaccine (1) Never done  . COLONOSCOPY (Pts 45-9yrs Insurance coverage will need to be confirmed)  02/29/2020    Colorectal cancer screening: Referral to GI placed 05/08/2020. Pt aware the office will call re: appt.  Lung Cancer Screening: (Low Dose CT Chest recommended if Age 68-80 years, 30 pack-year currently smoking OR have quit w/in 15years.) does qualify.   Lung Cancer Screening Referral: No   Additional Screening:  Hepatitis C Screening: does qualify;   Vision Screening: Recommended annual ophthalmology exams for early detection of glaucoma and other disorders of the eye. Is the patient up to date with their annual eye exam?  Yes  Who is the provider or what is the name of the office in which the patient attends annual eye exams? Patient unable to remember eye doctor name If  pt is not established with a provider, would they like to be referred to a provider to establish care? No .   Dental Screening: Recommended annual dental exams for proper oral hygiene  Community Resource Referral / Chronic Care Management: CRR required this visit?  No   CCM required this visit?  No      Plan:     I have personally reviewed and noted the following in the patient's chart:   . Medical and social history . Use of alcohol, tobacco or illicit drugs  . Current medications and supplements . Functional ability and status . Nutritional status . Physical activity . Advanced directives . List of other physicians . Hospitalizations, surgeries, and ER visits in previous 12 months . Vitals . Screenings to include cognitive, depression, and falls . Referrals and appointments  In addition, I have reviewed and discussed with patient certain preventive protocols, quality metrics, and best practice recommendations. A written personalized care plan for preventive services as well as general preventive health recommendations were provided to patient.     Ofilia Neas, LPN   3/34/3568   Nurse Notes: None

## 2020-05-08 ENCOUNTER — Ambulatory Visit (INDEPENDENT_AMBULATORY_CARE_PROVIDER_SITE_OTHER): Payer: Medicare (Managed Care)

## 2020-05-08 ENCOUNTER — Other Ambulatory Visit: Payer: Self-pay

## 2020-05-08 VITALS — BP 116/60 | HR 105 | Temp 97.8°F | Ht 68.0 in | Wt 171.1 lb

## 2020-05-08 DIAGNOSIS — C3490 Malignant neoplasm of unspecified part of unspecified bronchus or lung: Secondary | ICD-10-CM

## 2020-05-08 DIAGNOSIS — Z0000 Encounter for general adult medical examination without abnormal findings: Secondary | ICD-10-CM

## 2020-05-08 NOTE — Patient Instructions (Signed)
[name] , Thank you for taking time to come for your Medicare Wellness Visit. I appreciate your ongoing commitment to your health goals. Please review the following plan we discussed and let me know if I can assist you in the future.   Screening recommendations/referrals: Colonoscopy: Up to date, you will need to schedule a consult with Dr. Carlean Purl in December of this year to discuss if you will need to have a repeat colonoscopy Recommended yearly ophthalmology/optometry visit for glaucoma screening and checkup Recommended yearly dental visit for hygiene and checkup  Vaccinations: Influenza vaccine: Patient declined  Pneumococcal vaccine: Completed series  Tdap vaccine: Up to date, next due 09/10/2027 Shingles vaccine: Currently due for Shingrix, if you wish to receive we recommend that you receive at your local pharmacy as it is less expensive     Advanced directives: Advanced Directive Paper work was given to you today. Please review and complete and return copy so that we may scan into your chart.   Conditions/risks identified: None   Next appointment: None   Preventive Care 65 Years and Older, Male Preventive care refers to lifestyle choices and visits with your health care provider that can promote health and wellness. What does preventive care include?  A yearly physical exam. This is also called an annual well check.  Dental exams once or twice a year.  Routine eye exams. Ask your health care provider how often you should have your eyes checked.  Personal lifestyle choices, including:  Daily care of your teeth and gums.  Regular physical activity.  Eating a healthy diet.  Avoiding tobacco and drug use.  Limiting alcohol use.  Practicing safe sex.  Taking low doses of aspirin every [place].  Taking vitamin and mineral supplements as recommended by your health care provider. What happens during an annual well check? The services and screenings done by your health  care provider during your annual well check will depend on your age, overall health, lifestyle risk factors, and family history of disease. Counseling  Your health care provider may ask you questions about your:  Alcohol use.  Tobacco use.  Drug use.  Emotional well-being.  Home and relationship well-being.  Sexual activity.  Eating habits.  History of falls.  Memory and ability to understand (cognition).  Work and work Statistician. Screening  You may have the following tests or measurements:  Height, weight, and BMI.  Blood pressure.  Lipid and cholesterol levels. These may be checked every 5 years, or more frequently if you are over 80 years old.  Skin check.  Lung cancer screening. You may have this screening every year starting at age 80 if you have a 30-pack-year history of smoking and currently smoke or have quit within the past 15 years.  Fecal occult blood test (FOBT) of the stool. You may have this test every year starting at age 80.  Flexible sigmoidoscopy or colonoscopy. You may have a sigmoidoscopy every 5 years or a colonoscopy every 10 years starting at age 80.  Prostate cancer screening. Recommendations will vary depending on your family history and other risks.  Hepatitis C blood test.  Hepatitis B blood test.  Sexually transmitted disease (STD) testing.  Diabetes screening. This is done by checking your blood sugar (glucose) after you have not eaten for a while (fasting). You may have this done every 1-3 years.  Abdominal aortic aneurysm (AAA) screening. You may need this if you are a current or former smoker.  Osteoporosis. You may be screened  starting at age 80 if you are at high risk. Talk with your health care provider about your test results, treatment options, and if necessary, the need for more tests. Vaccines  Your health care provider may recommend certain vaccines, such as:  Influenza vaccine. This is recommended every  year.  Tetanus, diphtheria, and acellular pertussis (Tdap, Td) vaccine. You may need a Td booster every 10 years.  Zoster vaccine. You may need this after age 80.  Pneumococcal 13-valent conjugate (PCV13) vaccine. One dose is recommended after age 80.  Pneumococcal polysaccharide (PPSV23) vaccine. One dose is recommended after age 80. Talk to your health care provider about which screenings and vaccines you need and how often you need them. This information is not intended to replace advice given to you by your health care provider. Make sure you discuss any questions you have with your health care provider. Document Released: 03/29/2015 Document Revised: 11/20/2015 Document Reviewed: 01/01/2015 Elsevier Interactive Patient Education  2017 [employer] Prevention in the Home Falls can cause injuries. They can happen to people of all ages. There are many things you can do to make your home safe and to help prevent falls. What can I do on the outside of my home?  Regularly fix the edges of walkways and driveways and fix any cracks.  Remove anything that might make you trip as you walk through a door, such as a raised step or threshold.  Trim any bushes or trees on the path to your home.  Use bright outdoor lighting.  Clear any walking paths of anything that might make someone trip, such as rocks or tools.  Regularly check to see if handrails are loose or broken. Make sure that both sides of any steps have handrails.  Any raised decks and porches should have guardrails on the edges.  Have any leaves, snow, or ice cleared regularly.  Use sand or salt on walking paths during winter.  Clean up any spills in your garage right away. This includes oil or grease spills. What can I do in the bathroom?  Use night lights.  Install grab bars by the toilet and in the tub and shower. Do not use towel bars as grab bars.  Use non-skid mats or decals in the tub or shower.  If you  need to sit down in the shower, use a plastic, non-slip stool.  Keep the floor dry. Clean up any water that spills on the floor as soon as it happens.  Remove soap buildup in the tub or shower regularly.  Attach bath mats securely with double-sided non-slip rug tape.  Do not have throw rugs and other things on the floor that can make you trip. What can I do in the bedroom?  Use night lights.  Make sure that you have a light by your bed that is easy to reach.  Do not use any sheets or blankets that are too big for your bed. They should not hang down onto the floor.  Have a firm chair that has side arms. You can use this for support while you get dressed.  Do not have throw rugs and other things on the floor that can make you trip. What can I do in the kitchen?  Clean up any spills right away.  Avoid walking on wet floors.  Keep items that you use a lot in easy-to-reach places.  If you need to reach something above you, use a strong step stool that has a grab  bar.  Keep electrical cords out of the way.  Do not use floor polish or wax that makes floors slippery. If you must use wax, use non-skid floor wax.  Do not have throw rugs and other things on the floor that can make you trip. What can I do with my stairs?  Do not leave any items on the stairs.  Make sure that there are handrails on both sides of the stairs and use them. Fix handrails that are broken or loose. Make sure that handrails are as long as the stairways.  Check any carpeting to make sure that it is firmly attached to the stairs. Fix any carpet that is loose or worn.  Avoid having throw rugs at the top or bottom of the stairs. If you do have throw rugs, attach them to the floor with carpet tape.  Make sure that you have a light switch at the top of the stairs and the bottom of the stairs. If you do not have them, ask someone to add them for you. What else can I do to help prevent falls?  Wear shoes  that:  Do not have high heels.  Have rubber bottoms.  Are comfortable and fit you well.  Are closed at the toe. Do not wear sandals.  If you use a stepladder:  Make sure that it is fully opened. Do not climb a closed stepladder.  Make sure that both sides of the stepladder are locked into place.  Ask someone to hold it for you, if possible.  Clearly mark and make sure that you can see:  Any grab bars or handrails.  First and last steps.  Where the edge of each step is.  Use tools that help you move around (mobility aids) if they are needed. These include:  Canes.  Walkers.  Scooters.  Crutches.  Turn on the lights when you go into a dark area. Replace any light bulbs as soon as they burn out.  Set up your furniture so you have a clear path. Avoid moving your furniture around.  If any of your floors are uneven, fix them.  If there are any pets around you, be aware of where they are.  Review your medicines with your doctor. Some medicines can make you feel dizzy. This can increase your chance of falling. Ask your doctor what other things that you can do to help prevent falls. This information is not intended to replace advice given to you by your health care provider. Make sure you discuss any questions you have with your health care provider. Document Released: 12/27/2008 Document Revised: 08/08/2015 Document Reviewed: 04/06/2014 Elsevier Interactive Patient Education  2017 [employer].

## 2020-05-10 ENCOUNTER — Ambulatory Visit (HOSPITAL_COMMUNITY): Payer: Medicare (Managed Care)

## 2020-05-10 ENCOUNTER — Inpatient Hospital Stay: Payer: Medicare (Managed Care)

## 2020-05-13 ENCOUNTER — Ambulatory Visit: Payer: Self-pay | Admitting: Internal Medicine

## 2020-05-17 ENCOUNTER — Ambulatory Visit (HOSPITAL_COMMUNITY): Payer: Medicare (Managed Care)

## 2020-05-17 ENCOUNTER — Inpatient Hospital Stay: Payer: Medicare (Managed Care)

## 2020-05-20 ENCOUNTER — Ambulatory Visit: Payer: Medicare (Managed Care) | Admitting: Internal Medicine

## 2020-05-24 ENCOUNTER — Other Ambulatory Visit: Payer: Medicare HMO

## 2020-05-24 ENCOUNTER — Ambulatory Visit (HOSPITAL_COMMUNITY): Payer: Medicare (Managed Care)

## 2020-05-27 ENCOUNTER — Ambulatory Visit: Payer: Medicare HMO | Admitting: Internal Medicine

## 2020-06-03 NOTE — Progress Notes (Signed)
Cardiology Office Note:    Date:  06/04/2020   ID:  Timothy Wood, DOB 26-Mar-1940, MRN [id number]  PCP:  [name], [name], MD  Cardiologist:  Timothy Heinz, MD  Electrophysiologist:  None   Referring MD: [name], [name], MD   Chief Complaint  Patient presents with  . Edema    History of Present Illness:    Timothy Wood is a 80 y.o. male with a hx of lung cancer, adenomatous colon polyps, DVT/PE on Eliquis, COPD, tobacco use, seizures, hypertension, hyperlipidemia, PAD s/p iliac stenting in 2010, and descending aortic aneurysm status post repair in 2012 who presents for follow-up.  He was referred for preop evaluation prior to colonoscopy, initially seen on 02/28/2020.  Smokes 1 pack/day for over 60 years.    Echocardiogram on 04/05/2020 showed normal biventricular function, grade 1 diastolic dysfunction, no significant valvular disease.  Since last clinic visit, he reports he has been doing well.  Denies any chest pain, dyspnea, or palpitations.  Reports occasional lightheadedness but denies any syncope.  Reports no changes in lower extremity edema.  Continues to smoke 1 pack/day.   Past Medical History:  Diagnosis Date  . Adenocarcinoma of lung, stage 1 ([place])   . Anemia   . Angiodysplasia of cecum 11/23/2017   Multiple 1-2 mm  . Arthritis    back possibly  . Blood clotting disorder ([place])   . Cataract    removed bilaterally   . Chronic obstructive pulmonary disease (COPD) ([place])    "patient not aware"  . Clotting disorder (HCC)    PE LUNG, dvt leg   . Colon polyps   . Depression   . Diverticulosis   . DVT (deep venous thrombosis) ([place])    Post op  knee surgery  . DVT (deep venous thrombosis) ([place])   . GERD (gastroesophageal reflux disease)   . Histoplasmosis   . Histoplasmosis with pneumonia ([place]) 04/01/2015  . Hypercholesteremia   . Hyperlipidemia   . Hypertension   . Laceration of head 08/05/2015  . Lumbar compression fracture ([place])   . Lung cancer  ([place]) dx'd 2016  . Personal history of colonic polyps 2008   multiple adenomas 2008-2009 exams with large cecal adenoma  . Pneumonia    in history  . Polyposis coli - attenuated 09/12/2010   2008: adenomas with 9 polyps index and 2.5 cm cecal polyp removed over 3 exams, last 2009 with APC. diverticulosis also. 11/24/2010: 12 polyps removed - adenomas Anticipate routine repeat colonoscopy Spring 2013 Have recommended he see genetics counselor re ? Polyposis 03/27/2016 14 polyps removed max 15 mm   . Pulmonary nodule 06/2006   9 mm right upper obe pulmonary nodule (negative Pet Scan)  . PVD (peripheral vascular disease) ([place])    left leg s/p stent  . Sebaceous cyst    neck  . Seborrheic keratosis   . Seizures ([place])    4-5 yrs ago had seizures when in ICU- none since this time   . Sinusitis   . Tobacco abuse   . Tremor     Past Surgical History:  Procedure Laterality Date  . ANGIOPLASTY / STENTING ILIAC  08/2008   Dr. Trula Wood  . BACK SURGERY  1975, 1978   x2 lumbar fusion over 30 years ago  . BACK SURGERY    . COLONOSCOPY  05/11/2006; 07/27/2006;05/05/2007, 11/24/2010   adenomas with 9 polyps index and 2.5 cm cecal polyp removed over 3 exams, last 2009 with APC. diverticulosis also.  2012: 12 adenomas removed, largest 8-10 mm, diverticulosis  . CORONARY ANGIOPLASTY WITH STENT PLACEMENT    . DESCENDING AORTIC ANEURYSM REPAIR W/ STENT  04/2010   Dr. Trula Wood  . ESOPHAGOGASTRODUODENOSCOPY  05/05/2007   GERD - one erosion  . EYE SURGERY Bilateral    cataract  . KNEE SURGERY  2002   left- cartilage   . LUNG BIOPSY    . PERCUTANEOUS PINNING Left 06/24/2012   Procedure: Repair Complex Laceration Left Thumb with percutaneous pinning.;  Surgeon: Timothy Bible, MD;  Location: [facility];  Service: Plastics;  Laterality: Left;  . THUMB AMPUTATION Left    pinning and index finger  . TONSILLECTOMY  1956  . TONSILLECTOMY    . UPPER GASTROINTESTINAL ENDOSCOPY    . VIDEO ASSISTED THORACOSCOPY  (VATS)/WEDGE RESECTION Right 01/24/2015   Procedure: RIGHT VIDEO ASSISTED THORACOSCOPY (VATS)/WEDGE RESECTION;  Surgeon: Timothy Nakayama, MD;  Location: [facility];  Service: Thoracic;  Laterality: Right;  . vocal cord polyps removed      Current Medications: Current Meds  Medication Sig  . amLODipine (NORVASC) 10 MG tablet Take 1 tablet (10 mg total) by mouth daily.  [name] Kitchen apixaban (ELIQUIS) 5 MG TABS tablet Take 1 tablet (5 mg total) by mouth 2 (two) times daily.  . DULoxetine (CYMBALTA) 30 MG capsule TAKE ONE CAPSULE BY MOUTH DAILY  . EPINEPHrine (EPIPEN 2-PAK) 0.3 mg/0.3 mL IJ SOAJ injection To use daily if needed for severe allergy reaction.  . irbesartan (AVAPRO) 150 MG tablet TAKE ONE TABLET BY MOUTH DAILY  . omeprazole (PRILOSEC) 20 MG capsule TAKE ONE CAPSULE BY MOUTH TWICE A DAY BEFORE MEALS  . rosuvastatin (CRESTOR) 20 MG tablet TAKE ONE TABLET BY MOUTH DAILY  . tiZANidine (ZANAFLEX) 2 MG tablet 1 tab 2 times daily as needed.     Allergies:   Codeine and Hydrocodone   Social History   Socioeconomic History  . Marital status: Widowed    Spouse name: Not on file  . Number of children: 2  . Years of education: Not on file  . Highest education level: Not on file  Occupational History  . Occupation: Retired    Fish farm manager: RETIRED  Tobacco Use  . Smoking status: Current Every Day Smoker    Packs/day: 1.00    Years: 60.00    Pack years: 60.00    Types: Cigarettes  . Smokeless tobacco: Never Used  Vaping Use  . Vaping Use: Never used  Substance and Sexual Activity  . Alcohol use: Yes    Alcohol/week: 0.0 standard drinks    Comment: quit drinking after last hospitalization- may 2017  . Drug use: No  . Sexual activity: Not Currently  Other Topics Concern  . Not on file  Social History Narrative       Married for over 50 years.  Wife died from lung cancer.     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     Occasional alcohol.      Tobacco use -  over 50 pack years.   Father died at age 72 of suicide.   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   Social Determinants of Health   Financial Resource Strain: Low Risk   . Difficulty of Paying Living Expenses: Not hard at all  Food Insecurity: No Food Insecurity  . Worried About Charity fundraiser in the Last Year: Never true  . Ran Out of Food in the Last Year: Never true  Transportation Needs: No Transportation Needs  . Lack of Transportation (Medical): No  . Lack of Transportation (Non-Medical): No  Physical Activity: Inactive  . Days of Exercise per Week: 0 days  . Minutes of Exercise per Session: 0 min  Stress: No Stress Concern Present  . Feeling of Stress : Not at all  Social Connections: Socially Isolated  . Frequency of Communication with Friends and Family: More than three times a week  . Frequency of Social Gatherings with Friends and Family: More than three times a week  . Attends Religious Services: Never  . Active Member of Clubs or Organizations: No  . Attends Archivist Meetings: Never  . Marital Status: Widowed     Family History: The patient's family history includes Cancer (age of onset: 62) in his mother; Colon cancer (age of onset: 60) in his mother; Deep vein thrombosis in his mother; Diabetes in an other family member; Heart disease in his brother and brother; Lung cancer in his mother and son; Rheum arthritis in his daughter and sister; Uterine cancer in his mother. There is no history of Colon polyps, Stomach cancer, or Rectal cancer.  ROS:   Please see the history of present illness.     All other systems reviewed and are negative.  EKGs/Labs/Other Studies Reviewed:    The following studies were reviewed today:   EKG:  EKG is not ordered today.  The ekg ordered today demonstrates normal sinus rhythm, rate 70, PVC, no ST/T abnormalities  TTE 08/06/15: - Left ventricle: The cavity size was normal. Wall thickness was   normal. Systolic  function was vigorous. The estimated ejection   fraction was in the range of 65% to 70%. Wall motion was normal;   there were no regional wall motion abnormalities. Doppler   parameters are consistent with abnormal left ventricular   relaxation (grade 1 diastolic dysfunction).  Recent Labs: 03/14/2020: ALT 13; BNP 30.1; BUN 10; Creatinine, Ser 0.96; Potassium 4.6; Sodium 143  Recent Lipid Panel    Component Value Date/Time   CHOL 135 03/14/2020 1407   TRIG 234 (H) 03/14/2020 1407   HDL 38 (L) 03/14/2020 1407   CHOLHDL 3.6 03/14/2020 1407   CHOLHDL 4 10/15/2015 0801   VLDL 22.6 10/15/2015 0801   LDLCALC 59 03/14/2020 1407   LDLDIRECT 118.8 06/14/2006 1144    Physical Exam:    VS:  BP (!) 162/75   Pulse 79   Ht 5\' 8"  (1.727 m)   Wt 172 lb 3.2 oz (78.1 kg)   SpO2 98%   BMI 26.18 kg/m     Wt Readings from Last 3 Encounters:  06/04/20 172 lb 3.2 oz (78.1 kg)  05/08/20 171 lb 2 oz (77.6 kg)  03/14/20 175 lb 6.4 oz (79.6 kg)     GEN: Well nourished, well developed in no acute distress HEENT: Normal NECK: No JVD LYMPHATICS: No lymphadenopathy CARDIAC: RRR, no murmurs, rubs, gallops RESPIRATORY:  Clear to auscultation without rales, wheezing or rhonchi  ABDOMEN: Soft, non-tender, non-distended MUSCULOSKELETAL:  trace edema; No deformity  SKIN: Warm and dry NEUROLOGIC:  Alert and oriented x 3 PSYCHIATRIC:  Normal affect   ASSESSMENT:    1. PAD (peripheral artery disease) ([place])   2. Lower leg edema   3. Essential hypertension   4. Hyperlipidemia, unspecified hyperlipidemia type   5. Tobacco use    PLAN:    Lower extremity edema: Mild bilateral lower extremity edema.  Echocardiogram shows no structural heart disease.  Normal albumin.  Suspect due to amlodipine use.  Hypertension: On amlodipine, irbesartan. BP elevated in clinic today, but reports he has been missing doses of his meds. Encouraged compliance.  Asked to check BP daily for next week and call with  results.  PAD: Status post iliac artery stenting.  Continue apixaban, rosuvastatin.  Will check ABIs.  H/o DVT/PE: on Eliquis  Hyperlipidemia: Goal LDL less than 70 given PAD.  On rosuvastatin 20 mg daily.  LDL 59 on 03/14/2020  Tobacco use: Patient counseled on the risks of tobacco use and cessation strongly encouraged.  Patient is not interested in quitting smoking at this time  RTC in 6 months    Medication Adjustments/Labs and Tests Ordered: Current medicines are reviewed at length with the patient today.  Concerns regarding medicines are outlined above.  Orders Placed This Encounter  Procedures  . VAS [place] ABI WITH/WO TBI  . VAS [place] LOWER EXTREMITY ARTERIAL DUPLEX   No orders of the defined types were placed in this encounter.   Patient Instructions  Medication Instructions:  Your physician recommends that you continue on your current medications as directed. Please refer to the Current Medication list given to you today.  *If you need a refill on your cardiac medications before your next appointment, please call your pharmacy*  Testing/Procedures: Your physician has requested that you have an ankle brachial index (ABI). During this test an ultrasound and blood pressure cuff are used to evaluate the arteries that supply the arms and legs with blood. Allow thirty minutes for this exam. There are no restrictions or special instructions.   Follow-Up: At [facility], you and your health needs are our priority.  As part of our continuing mission to provide you with exceptional heart care, we have created designated Provider Care Teams.  These Care Teams include your primary Cardiologist (physician) and Advanced Practice Providers (APPs -  Physician Assistants and Nurse Practitioners) who all work together to provide you with the care you need, when you need it.  We recommend signing up for the patient portal called "MyChart".  Sign up information is provided on this After Visit  Summary.  MyChart is used to connect with patients for Virtual Visits (Telemedicine).  Patients are able to view lab/test results, encounter notes, upcoming appointments, etc.  Non-urgent messages can be sent to your provider as well.   To learn more about what you can do with MyChart, go to [email or web address].    Your next appointment:   6 month(s)  The format for your next appointment:   In Person  Provider:   Oswaldo Milian, MD   Other Instructions: Please check your blood pressure at home daily, write it down.  Call the office or send message via Mychart with the readings in 1 week for Dr. Gardiner Rhyme to review.       Signed, Timothy Heinz, MD  06/04/2020 10:15 PM    [place]

## 2020-06-04 ENCOUNTER — Encounter: Payer: Self-pay | Admitting: Cardiology

## 2020-06-04 ENCOUNTER — Ambulatory Visit (INDEPENDENT_AMBULATORY_CARE_PROVIDER_SITE_OTHER): Payer: Medicare (Managed Care) | Admitting: Cardiology

## 2020-06-04 ENCOUNTER — Other Ambulatory Visit: Payer: Self-pay

## 2020-06-04 VITALS — BP 162/75 | HR 79 | Ht 68.0 in | Wt 172.2 lb

## 2020-06-04 DIAGNOSIS — I739 Peripheral vascular disease, unspecified: Secondary | ICD-10-CM

## 2020-06-04 DIAGNOSIS — I10 Essential (primary) hypertension: Secondary | ICD-10-CM

## 2020-06-04 DIAGNOSIS — E785 Hyperlipidemia, unspecified: Secondary | ICD-10-CM

## 2020-06-04 DIAGNOSIS — Z720 Tobacco use: Secondary | ICD-10-CM

## 2020-06-04 DIAGNOSIS — R600 Localized edema: Secondary | ICD-10-CM

## 2020-06-04 NOTE — Patient Instructions (Signed)
Medication Instructions:  Your physician recommends that you continue on your current medications as directed. Please refer to the Current Medication list given to you today.  *If you need a refill on your cardiac medications before your next appointment, please call your pharmacy*  Testing/Procedures: Your physician has requested that you have an ankle brachial index (ABI). During this test an ultrasound and blood pressure cuff are used to evaluate the arteries that supply the arms and legs with blood. Allow thirty minutes for this exam. There are no restrictions or special instructions.   Follow-Up: At [facility], you and your health needs are our priority.  As part of our continuing mission to provide you with exceptional heart care, we have created designated Provider Care Teams.  These Care Teams include your primary Cardiologist (physician) and Advanced Practice Providers (APPs -  Physician Assistants and Nurse Practitioners) who all work together to provide you with the care you need, when you need it.  We recommend signing up for the patient portal called "MyChart".  Sign up information is provided on this After Visit Summary.  MyChart is used to connect with patients for Virtual Visits (Telemedicine).  Patients are able to view lab/test results, encounter notes, upcoming appointments, etc.  Non-urgent messages can be sent to your provider as well.   To learn more about what you can do with MyChart, go to [email or web address].    Your next appointment:   6 month(s)  The format for your next appointment:   In Person  Provider:   Oswaldo Milian, MD   Other Instructions: Please check your blood pressure at home daily, write it down.  Call the office or send message via Mychart with the readings in 1 week for Dr. Gardiner Rhyme to review.

## 2020-06-12 ENCOUNTER — Ambulatory Visit (HOSPITAL_COMMUNITY)
Admission: RE | Admit: 2020-06-12 | Payer: Medicare (Managed Care) | Source: Ambulatory Visit | Attending: Cardiology | Admitting: Cardiology

## 2020-06-24 ENCOUNTER — Encounter (HOSPITAL_COMMUNITY): Payer: Medicare (Managed Care)

## 2020-07-16 ENCOUNTER — Telehealth: Payer: Self-pay | Admitting: Family Medicine

## 2020-07-16 DIAGNOSIS — Z86711 Personal history of pulmonary embolism: Secondary | ICD-10-CM

## 2020-07-16 MED ORDER — APIXABAN 5 MG PO TABS: 5.0000 mg | ORAL_TABLET | Freq: Two times a day (BID) | ORAL | 2 refills | Status: DC

## 2020-07-16 NOTE — Telephone Encounter (Signed)
Rx sent into pharmacy as requested.

## 2020-07-16 NOTE — Telephone Encounter (Signed)
[facility] call and stated they need a new RX on Eliquis 5 mg sent [place] [facility] [place] [address]

## 2020-07-30 DIAGNOSIS — J9690 Respiratory failure, unspecified, unspecified whether with hypoxia or hypercapnia: Secondary | ICD-10-CM | POA: Diagnosis not present

## 2020-07-30 DIAGNOSIS — R4182 Altered mental status, unspecified: Secondary | ICD-10-CM | POA: Diagnosis not present

## 2020-07-30 DIAGNOSIS — R0602 Shortness of breath: Secondary | ICD-10-CM | POA: Diagnosis not present

## 2020-07-30 DIAGNOSIS — F17210 Nicotine dependence, cigarettes, uncomplicated: Secondary | ICD-10-CM | POA: Diagnosis not present

## 2020-07-30 DIAGNOSIS — Z20822 Contact with and (suspected) exposure to covid-19: Secondary | ICD-10-CM | POA: Diagnosis not present

## 2020-07-30 DIAGNOSIS — J189 Pneumonia, unspecified organism: Secondary | ICD-10-CM | POA: Diagnosis not present

## 2020-07-30 DIAGNOSIS — R109 Unspecified abdominal pain: Secondary | ICD-10-CM | POA: Diagnosis not present

## 2020-07-31 DIAGNOSIS — R918 Other nonspecific abnormal finding of lung field: Secondary | ICD-10-CM | POA: Diagnosis not present

## 2020-07-31 DIAGNOSIS — F17210 Nicotine dependence, cigarettes, uncomplicated: Secondary | ICD-10-CM | POA: Diagnosis not present

## 2020-07-31 DIAGNOSIS — J440 Chronic obstructive pulmonary disease with acute lower respiratory infection: Secondary | ICD-10-CM | POA: Diagnosis not present

## 2020-07-31 DIAGNOSIS — M1990 Unspecified osteoarthritis, unspecified site: Secondary | ICD-10-CM | POA: Diagnosis not present

## 2020-07-31 DIAGNOSIS — Z720 Tobacco use: Secondary | ICD-10-CM | POA: Diagnosis not present

## 2020-07-31 DIAGNOSIS — E876 Hypokalemia: Secondary | ICD-10-CM | POA: Diagnosis not present

## 2020-07-31 DIAGNOSIS — J9690 Respiratory failure, unspecified, unspecified whether with hypoxia or hypercapnia: Secondary | ICD-10-CM | POA: Diagnosis not present

## 2020-07-31 DIAGNOSIS — Z7901 Long term (current) use of anticoagulants: Secondary | ICD-10-CM | POA: Diagnosis not present

## 2020-07-31 DIAGNOSIS — J441 Chronic obstructive pulmonary disease with (acute) exacerbation: Secondary | ICD-10-CM | POA: Diagnosis not present

## 2020-07-31 DIAGNOSIS — J9601 Acute respiratory failure with hypoxia: Secondary | ICD-10-CM | POA: Diagnosis not present

## 2020-07-31 DIAGNOSIS — J189 Pneumonia, unspecified organism: Secondary | ICD-10-CM | POA: Diagnosis not present

## 2020-07-31 DIAGNOSIS — R030 Elevated blood-pressure reading, without diagnosis of hypertension: Secondary | ICD-10-CM | POA: Diagnosis not present

## 2020-07-31 DIAGNOSIS — I10 Essential (primary) hypertension: Secondary | ICD-10-CM | POA: Diagnosis not present

## 2020-08-08 ENCOUNTER — Other Ambulatory Visit: Payer: Self-pay | Admitting: Family Medicine

## 2020-08-08 DIAGNOSIS — M545 Low back pain, unspecified: Secondary | ICD-10-CM

## 2020-08-14 ENCOUNTER — Encounter: Payer: Self-pay | Admitting: Family Medicine

## 2020-08-14 ENCOUNTER — Telehealth: Payer: Self-pay | Admitting: Internal Medicine

## 2020-08-14 ENCOUNTER — Other Ambulatory Visit: Payer: Self-pay

## 2020-08-14 ENCOUNTER — Ambulatory Visit (INDEPENDENT_AMBULATORY_CARE_PROVIDER_SITE_OTHER): Payer: Medicare PPO | Admitting: Family Medicine

## 2020-08-14 DIAGNOSIS — E875 Hyperkalemia: Secondary | ICD-10-CM | POA: Diagnosis not present

## 2020-08-14 DIAGNOSIS — G8929 Other chronic pain: Secondary | ICD-10-CM

## 2020-08-14 DIAGNOSIS — R7989 Other specified abnormal findings of blood chemistry: Secondary | ICD-10-CM

## 2020-08-14 DIAGNOSIS — I2699 Other pulmonary embolism without acute cor pulmonale: Secondary | ICD-10-CM | POA: Diagnosis not present

## 2020-08-14 DIAGNOSIS — J449 Chronic obstructive pulmonary disease, unspecified: Secondary | ICD-10-CM | POA: Diagnosis not present

## 2020-08-14 DIAGNOSIS — I10 Essential (primary) hypertension: Secondary | ICD-10-CM | POA: Diagnosis not present

## 2020-08-14 DIAGNOSIS — R945 Abnormal results of liver function studies: Secondary | ICD-10-CM | POA: Diagnosis not present

## 2020-08-14 DIAGNOSIS — J189 Pneumonia, unspecified organism: Secondary | ICD-10-CM | POA: Diagnosis not present

## 2020-08-14 DIAGNOSIS — M545 Low back pain, unspecified: Secondary | ICD-10-CM | POA: Diagnosis not present

## 2020-08-14 LAB — CBC WITH DIFFERENTIAL/PLATELET
Basophils Absolute: 0.1 10*3/uL (ref 0.0–0.1)
Basophils Relative: 0.8 % (ref 0.0–3.0)
Eosinophils Absolute: 0.1 10*3/uL (ref 0.0–0.7)
Eosinophils Relative: 1 % (ref 0.0–5.0)
HCT: 48.2 % (ref 39.0–52.0)
Hemoglobin: 16.1 g/dL (ref 13.0–17.0)
Lymphocytes Relative: 14.5 % (ref 12.0–46.0)
Lymphs Abs: 1.1 10*3/uL (ref 0.7–4.0)
MCHC: 33.5 g/dL (ref 30.0–36.0)
MCV: 95 fl (ref 78.0–100.0)
Monocytes Absolute: 0.9 10*3/uL (ref 0.1–1.0)
Monocytes Relative: 11.5 % (ref 3.0–12.0)
Neutro Abs: 5.4 10*3/uL (ref 1.4–7.7)
Neutrophils Relative %: 72.2 % (ref 43.0–77.0)
Platelets: 333 10*3/uL (ref 150.0–400.0)
RBC: 5.07 Mil/uL (ref 4.22–5.81)
RDW: 14.4 % (ref 11.5–15.5)
WBC: 7.5 10*3/uL (ref 4.0–10.5)

## 2020-08-14 MED ORDER — DULOXETINE HCL 30 MG PO CPEP: 30.0000 mg | ORAL_CAPSULE | Freq: Every day | ORAL | 2 refills | Status: DC

## 2020-08-14 NOTE — Patient Instructions (Signed)
A few things to remember from today's visit:   Essential hypertension - Plan: Basic metabolic panel  Chronic low back pain without sciatica, unspecified back pain laterality - Plan: DULoxetine (CYMBALTA) 30 MG capsule  Hx of pulmonary emboli (HCC)  Hypomagnesemia - Plan: Magnesium, Basic metabolic panel  Pneumonia of both lungs due to infectious organism, unspecified part of lung - Plan: CBC with Differential/Platelet  Abnormal liver function test - Plan: Hepatic function panel, Hepatitis C antibody  If you need refills please call your pharmacy. Do not use My Chart to request refills or for acute issues that need immediate attention.   Complete antibiotic treatment. No changes in rest of meds.  Please be sure medication list is accurate. If a new problem present, please set up appointment sooner than planned today.

## 2020-08-14 NOTE — Telephone Encounter (Signed)
Pt called in to r/s appts from March. When looking at the appts it looks like the pt needs a CT scan and then MD visit a few days later. I called pt, no answer. Left msg telling the pt to contact [facility] to scheduled the scan and then I would schedule his MD visit around the scan. I gave pt [facility] number in the msg.

## 2020-08-14 NOTE — Progress Notes (Signed)
HPI: Timothy Wood is a 80 y.o. male with hx of HTN,PAD,chronic anticoagulation,HLD, and chronic pain here today to follow on recent hospitalization. Timothy Wood was hospitalized while Timothy Wood was in [facility] 07/31/20 to 08/02/20. Timothy Wood presented to the ER the day of admission c/o SOB and cough.  CXR: Clear emphysematous lungs without a pleural effusion or cardiomegaly. Postsurgical changes are noted in the right lung  Started on broad-spectrum abx (Rocephin and Zithromax )and systemic steroids. Discharged on Augmentin 875-125 mg bid to complete 5 days, Timothy Wood has 1 tab left. Also Guaifenesin 400 mg q 4 hours and Prednisone 40 mg daily x 3 days.  Spiriva cap once daily added. Timothy Wood is also on Albuterol inh to use 2 puff q 4-6 hours prn. Negative for fever,chills,SOB,or wheezing. Occasional cough. Negative for hemoptysis.  Timothy Wood is back home this past Friday, 5/26th. Lives alone. His daughter lives in town and checks on him regularly.  Timothy Wood feels like Timothy Wood is back to his baseline.  Respiratory failure due to COPD exacerbation and bilateral pneumonia. Still smoking. Timothy Wood is not interested in quitting.   Ref Range & Units 2 wk ago Comments  White Blood Cell Count 4.1 - 10.9 THOUS/uL 11.7High    Red Blood Cell Count 3.35 - 5.50 MIL/uL 4.18    Hemoglobin 12.9 - 16.6 GM/DL 13.1    Hematocrit 38.0 - 48.0 % 39.3    Mean Corpuscular Volume 81.0 - 95.0 FL 94.0    Mean Corpuscular Hemoglobin 27.0 - 33.0 PG 31.3    Mean Corpuscular Hemoglobin Conc 33.0 - 37.0 G/DL 33.3    Rdwcv 11.5 - 14.5 % 13.0    Rdwsd 35.0 - 42.0 FL 45.0High    Platelet Count 130 - 400 THOUS/uL 269    Mean Platelet Volume 7.4 - 10.4 FL 9.9    Nucleated Red Blood Cells 0 /100 WBC'S 0.0    Abs NRBC 0.0 THOUS/uL 0.0    Differential Type  AUTO    Neutrophils 42.2 - 75.2 % 79.1High    Lymphs 20.5 - 51.1 % 11.5Low    Monocytes 1.7 - 9.3 % 8.1    Eos 1.0 - 3.0 % 0.1Low    Basos 0.0 - 2.0 % 0.2    Imm Gran 0.0 - 0.4 % 1.0High      Pro calcitonin elevated at 0.11 (normal < 0.10). CULTURE RESPIRATORY Specimen:  Sputum - SPUTUM Component 2 wk ago  Specimen Type  SPUTUM   Special Handling  NONE   Gram Stain Result  MANY WBC  FEW SQUAMOUS EPITHELIAL CELLS  SCATTERED MIXED OROPHARYNGEAL FLORA   Culture Result  OROPHARYNGEAL MICROBIAL FLORA DIMINISHED   Status  08/03/2020 FINAL    HypoMg: 07/31/20 Mg++ was 2.0, upon admission it was 1.4. Timothy Wood has not taken Mg Oxide in 4 days. Legionella ag in urine negative.  STREP PNEU AG, URINE Component 2 wk ago  S.Pneu Ag UR  NEGATIVE   S.Pneu Ag UR Comment  PRESUMPTIVE NEGATIVE FOR PNEUMOCOCCAL PNEUMONIA, SUGGESTING NO CURRENT OR RECENT PNEUMOCOCCAL  INFECTION. INFECTION DUE TO S. PNEUMONIAE CANNOT BE RULED OUT SINCE THE ANTIGEN PRESENT IN THE  SAMPLE MAY BE BELOW THE DETECTION LIMIT OF THE TEST.    HTN: Timothy Wood is on Amlodipine 5 mg daily and Irbesartan 150 mg daily. Negative for severe/frequent headache, visual changes, chest pain, dyspnea, palpitation, claudication, focal weakness, or edema. Abnormal LFT's. Timothy Wood has not noted abdominal pain,nausea,or vomiting. Negative for high alcohol intake.   Ref Range & Units 2  wk ago  Glucose 70 - 99 MG/DL 95   Blood Urea Nitrogen 8 - 23 MG/DL 18   Creatinine 0.6 - 1.2 MG/DL 0.9   Sodium 136 - 145 MMOL/L 140   Potassium 3.5 - 5.4 MMOL/L 3.6   Chloride 98 - 107 MMOL/L 105   Co2 20 - 33 MMOL/L 21   Calcium 8.7 - 10.4 MG/DL 8.5Low   Protein Total 6.0 - 8.2 G/DL 5.9Low   Albumin 3.4 - 4.8 G/DL 3.3Low   A/G Ratio 0.8 - 2.0 1.3   Bilirubin, Total 0.3 - 1.2 MG/DL 0.2Low   AST(SGOT) 0 - 39 U/L 67High   Alkaline Phosphatase 25 - 100 U/L 86   Alt (SGPT) 10 - 40 U/L 105High   Est GFR >90 ML/MIN/1.73sq.m 81Low     Hx of PE, Timothy Wood is on Eliquis 5 mg bid. Timothy Wood has tolerated medication well. Timothy Wood has not noted nose/gum bleeding,melena,blood in stool,or gross hematuria.  Chronic back pain: Bilateral lower back pain, sometimes is  "bad." Mild constant, not radiated. Exacerbated by certain activities, yard work. Pain limits ADL's.  Timothy Wood is on Duloxetine 30 mg, which Timothy Wood is not certain is helping but would like top continue. Zanaflex 2 mg bid prn.  Review of Systems  Constitutional: Positive for fatigue. Negative for activity change, appetite change and fever.  HENT: Negative for nosebleeds, sore throat and trouble swallowing.   Gastrointestinal: Negative for abdominal pain, nausea and vomiting.  Genitourinary: Negative for decreased urine volume and dysuria.  Musculoskeletal: Positive for gait problem.  Neurological: Negative for syncope, facial asymmetry and weakness.  Rest see pertinent positives and negatives per HPI.  Current Outpatient Medications on File Prior to Visit  Medication Sig Dispense Refill  . apixaban (ELIQUIS) 5 MG TABS tablet Take 1 tablet (5 mg total) by mouth 2 (two) times daily. 180 tablet 2  . EPINEPHrine (EPIPEN 2-PAK) 0.3 mg/0.3 mL IJ SOAJ injection To use daily if needed for severe allergy reaction. 1 each 0  . irbesartan (AVAPRO) 150 MG tablet TAKE ONE TABLET BY MOUTH DAILY 90 tablet 1  . omeprazole (PRILOSEC) 20 MG capsule TAKE ONE CAPSULE BY MOUTH TWICE A DAY BEFORE MEALS 60 capsule 11  . rosuvastatin (CRESTOR) 20 MG tablet TAKE ONE TABLET BY MOUTH DAILY 90 tablet 1  . tiZANidine (ZANAFLEX) 2 MG tablet TAKE ONE TABLET BY MOUTH TWICE A DAY AS NEEDED 60 tablet 3   No current facility-administered medications on file prior to visit.   Past Medical History:  Diagnosis Date  . Adenocarcinoma of lung, stage 1 ([place])   . Anemia   . Angiodysplasia of cecum 11/23/2017   Multiple 1-2 mm  . Arthritis    back possibly  . Blood clotting disorder ([place])   . Cataract    removed bilaterally   . Chronic obstructive pulmonary disease (COPD) ([place])    "patient not aware"  . Clotting disorder (HCC)    PE LUNG, dvt leg   . Colon polyps   . Depression   . Diverticulosis   . DVT (deep venous  thrombosis) ([place])    Post op  knee surgery  . DVT (deep venous thrombosis) ([place])   . GERD (gastroesophageal reflux disease)   . Histoplasmosis   . Histoplasmosis with pneumonia ([place]) 04/01/2015  . Hypercholesteremia   . Hyperlipidemia   . Hypertension   . Laceration of head 08/05/2015  . Lumbar compression fracture ([place])   . Lung cancer ([place]) dx'd 2016  . Personal history of  colonic polyps 2008   multiple adenomas 2008-2009 exams with large cecal adenoma  . Pneumonia    in history  . Polyposis coli - attenuated 09/12/2010   2008: adenomas with 9 polyps index and 2.5 cm cecal polyp removed over 3 exams, last 2009 with APC. diverticulosis also. 11/24/2010: 12 polyps removed - adenomas Anticipate routine repeat colonoscopy Spring 2013 Have recommended Timothy Wood see genetics counselor re ? Polyposis 03/27/2016 14 polyps removed max 15 mm   . Pulmonary nodule 06/2006   9 mm right upper obe pulmonary nodule (negative Pet Scan)  . PVD (peripheral vascular disease) ([place])    left leg s/p stent  . Sebaceous cyst    neck  . Seborrheic keratosis   . Seizures ([place])    4-5 yrs ago had seizures when in ICU- none since this time   . Sinusitis   . Tobacco abuse   . Tremor    Allergies  Allergen Reactions  . Codeine Anaphylaxis  . Hydrocodone Itching    Social History   Socioeconomic History  . Marital status: Widowed    Spouse name: Not on file  . Number of children: 2  . Years of education: Not on file  . Highest education level: Not on file  Occupational History  . Occupation: Retired    Fish farm manager: RETIRED  Tobacco Use  . Smoking status: Current Every Day Smoker    Packs/day: 1.00    Years: 60.00    Pack years: 60.00    Types: Cigarettes  . Smokeless tobacco: Never Used  Vaping Use  . Vaping Use: Never used  Substance and Sexual Activity  . Alcohol use: Yes    Alcohol/week: 0.0 standard drinks    Comment: quit drinking after last hospitalization- may 2017  . Drug use: No  . Sexual  activity: Not Currently  Other Topics Concern  . Not on file  Social History Narrative       Married for over 50 years.  Wife died from lung cancer.     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     Occasional alcohol.      Tobacco use - over 50 pack years.   Father died at age 32 of suicide.   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   Social Determinants of Health   Financial Resource Strain: Low Risk   . Difficulty of Paying Living Expenses: Not hard at all  Food Insecurity: No Food Insecurity  . Worried About Charity fundraiser in the Last Year: Never true  . Ran Out of Food in the Last Year: Never true  Transportation Needs: No Transportation Needs  . Lack of Transportation (Medical): No  . Lack of Transportation (Non-Medical): No  Physical Activity: Inactive  . Days of Exercise per Week: 0 days  . Minutes of Exercise per Session: 0 min  Stress: No Stress Concern Present  . Feeling of Stress : Not at all  Social Connections: Socially Isolated  . Frequency of Communication with Friends and Family: More than three times a week  . Frequency of Social Gatherings with Friends and Family: More than three times a week  . Attends Religious Services: Never  . Active Member of Clubs or Organizations: No  . Attends Archivist Meetings: Never  . Marital Status: Widowed    Vitals:   08/14/20 1101  BP: 120/70  Pulse: (!) 105  Resp: 16  SpO2: 97%   Body mass index  is 25.24 kg/m.  Physical Exam Vitals and nursing note reviewed.  Constitutional:      General: Timothy Wood is not in acute distress.    Appearance: Timothy Wood is well-developed.  HENT:     Head: Normocephalic and atraumatic.     Mouth/Throat:     Mouth: Mucous membranes are moist.     Dentition: Has dentures.     Pharynx: Oropharynx is clear.  Eyes:     Conjunctiva/sclera: Conjunctivae normal.  Cardiovascular:     Rate and Rhythm: Normal rate and regular rhythm.     Heart sounds: No  murmur heard.     Comments: HR: 88/min. Trace LE pitting edema,bilateral. PT pulses present.  Pulmonary:     Effort: Pulmonary effort is normal. No respiratory distress.     Breath sounds: Normal breath sounds.  Abdominal:     Palpations: Abdomen is soft. There is no hepatomegaly or mass.     Tenderness: There is no abdominal tenderness.  Lymphadenopathy:     Cervical: No cervical adenopathy.  Skin:    General: Skin is warm.     Findings: No erythema or rash.  Neurological:     Mental Status: Timothy Wood is alert and oriented to person, place, and time.     Cranial Nerves: No cranial nerve deficit.     Motor: Tremor present.     Gait: Gait normal.     Comments: Mildly unstable gait w/o assistance.  Psychiatric:     Comments: Well groomed, good eye contact.   ASSESSMENT AND PLAN:  Timothy Wood was seen today for hospitalization follow-up.  Diagnoses and all orders for this visit:  Orders Placed This Encounter  Procedures  . Magnesium  . Basic metabolic panel  . Hepatic function panel  . CBC with Differential/Platelet  . Hepatitis C antibody   Lab Results  Component Value Date   WBC 7.5 08/14/2020   HGB 16.1 08/14/2020   HCT 48.2 08/14/2020   MCV 95.0 08/14/2020   PLT 333.0 08/14/2020   Lab Results  Component Value Date   CREATININE 1.12 08/14/2020   BUN 16 08/14/2020   NA 144 08/14/2020   K 5.4 No hemolysis seen (H) 08/14/2020   CL 103 08/14/2020   CO2 22 08/14/2020   Lab Results  Component Value Date   ALT 27 08/14/2020   AST 22 08/14/2020   ALKPHOS 93 08/14/2020   BILITOT 0.3 08/14/2020   Hypomagnesemia Mg++ normalized before hospital discharged, not longer on Mg++ supplementation. Further recommendations according to Mg++ result.  Chronic low back pain without sciatica, unspecified back pain laterality Timothy Wood would like to continue Duloxetine, it seems like it may be helping some with pain. Continue Zanaflex 2 mg bid prn. We discussed some side effects of  medications.  -     DULoxetine (CYMBALTA) 30 MG capsule; Take 1 capsule (30 mg total) by mouth daily.  Essential hypertension BP adequately controlled. Continue Amlodipine 5 mg daily and Irbesartan 150 mg daily. Low salt diet.  Hx of pulmonary emboli (HCC) Continue Eliquis 5 mg bid.  Pneumonia of both lungs due to infectious organism, unspecified part of lung Symptoms have resolved. F/U imagine is not needed at this time. Instructed about warning signs.  Abnormal liver function test Further recommendations according to LFT's result.  Chronic obstructive pulmonary disease, unspecified COPD type ([place]) Well controlled. Continue Spiriva cap once daily and Albuterol inh 2 puff q 4-6 hours prn. Timothy Wood understands adverse effects of smoking cessation, Timothy Wood is not interested in smoking  cessation.  Return in about 6 months (around 02/13/2021).  Armin Yerger G. [name], MD  [facility] [place] office.  A few things to remember from today's visit:   Essential hypertension - Plan: Basic metabolic panel  Chronic low back pain without sciatica, unspecified back pain laterality - Plan: DULoxetine (CYMBALTA) 30 MG capsule  Hx of pulmonary emboli (HCC)  Hypomagnesemia - Plan: Magnesium, Basic metabolic panel  Pneumonia of both lungs due to infectious organism, unspecified part of lung - Plan: CBC with Differential/Platelet  Abnormal liver function test - Plan: Hepatic function panel, Hepatitis C antibody  If you need refills please call your pharmacy. Do not use My Chart to request refills or for acute issues that need immediate attention.   Complete antibiotic treatment. No changes in rest of meds.  Please be sure medication list is accurate. If a new problem present, please set up appointment sooner than planned today.

## 2020-08-15 LAB — HEPATITIS C ANTIBODY
Hepatitis C Ab: NONREACTIVE
SIGNAL TO CUT-OFF: 0.01 (ref <1.00)

## 2020-08-15 NOTE — Progress Notes (Signed)
Cardiology Office Note:    Date:  08/16/2020   ID:  Timothy Wood, DOB 09-20-1940, MRN [id number]  PCP:  [name], [name], MD  Cardiologist:  Donato Heinz, MD  Electrophysiologist:  None   Referring MD: [name], [name], MD   Chief Complaint  Patient presents with  . Follow-up  . PAD    History of Present Illness:    Timothy Wood is a 80 y.o. male with a hx of lung cancer, adenomatous colon polyps, DVT/PE on Eliquis, COPD, tobacco use, seizures, hypertension, hyperlipidemia, PAD s/p iliac stenting in 2010, and descending aortic aneurysm status post repair in 2012 who presents for follow-up.  He was referred for preop evaluation prior to colonoscopy, initially seen on 02/28/2020.  Smokes 1 pack/day for over 60 years.    Echocardiogram on 04/05/2020 showed normal biventricular function, grade 1 diastolic dysfunction, no significant valvular disease.  Since last clinic visit, he was hospitalized in [place] from 5/18 through 08/02/2020.  He was found to have acute hypoxic respiratory failure secondary to bilateral pneumonia.  He was treated with broad-spectrum antibiotics, bronchodilators, and steroids.  Since discharge from hospital, he reports that he is doing much better.  Still feels fatigued, but states his dyspnea has resolved.  Denies any chest pain.  Denies any fevers or chills.  No lightheadedness, syncope, or lower extremity edema.  Continues to smoke, but is decreased to 10 to 15 cigarettes/day.  Reports BP has been in 110s when he checks at home.   Past Medical History:  Diagnosis Date  . Adenocarcinoma of lung, stage 1 ([facility])   . Anemia   . Angiodysplasia of cecum 11/23/2017   Multiple 1-2 mm  . Arthritis    back possibly  . Blood clotting disorder ([place])   . Cataract    removed bilaterally   . Chronic obstructive pulmonary disease (COPD) ([place])    "patient not aware"  . Clotting disorder ([facility])    PE LUNG, dvt leg   . Colon polyps   . Depression   .  Diverticulosis   . DVT (deep venous thrombosis) ([place])    Post op  knee surgery  . DVT (deep venous thrombosis) ([place])   . GERD (gastroesophageal reflux disease)   . Histoplasmosis   . Histoplasmosis with pneumonia ([place]) 04/01/2015  . Hypercholesteremia   . Hyperlipidemia   . Hypertension   . Laceration of head 08/05/2015  . Lumbar compression fracture ([place])   . Lung cancer ([place]) dx'd 2016  . Personal history of colonic polyps 2008   multiple adenomas 2008-2009 exams with large cecal adenoma  . Pneumonia    in history  . Polyposis coli - attenuated 09/12/2010   2008: adenomas with 9 polyps index and 2.5 cm cecal polyp removed over 3 exams, last 2009 with APC. diverticulosis also. 11/24/2010: 12 polyps removed - adenomas Anticipate routine repeat colonoscopy Spring 2013 Have recommended he see genetics counselor re ? Polyposis 03/27/2016 14 polyps removed max 15 mm   . Pulmonary nodule 06/2006   9 mm right upper obe pulmonary nodule (negative Pet Scan)  . PVD (peripheral vascular disease) ([place])    left leg s/p stent  . Sebaceous cyst    neck  . Seborrheic keratosis   . Seizures ([place])    4-5 yrs ago had seizures when in ICU- none since this time   . Sinusitis   . Tobacco abuse   . Tremor     Past Surgical History:  Procedure Laterality  Date  . ANGIOPLASTY / STENTING ILIAC  08/2008   Dr. Trula Slade  . BACK SURGERY  1975, 1978   x2 lumbar fusion over 30 years ago  . BACK SURGERY    . COLONOSCOPY  05/11/2006; 07/27/2006;05/05/2007, 11/24/2010   adenomas with 9 polyps index and 2.5 cm cecal polyp removed over 3 exams, last 2009 with APC. diverticulosis also.  2012: 12 adenomas removed, largest 8-10 mm, diverticulosis  . CORONARY ANGIOPLASTY WITH STENT PLACEMENT    . DESCENDING AORTIC ANEURYSM REPAIR W/ STENT  04/2010   Dr. Trula Slade  . ESOPHAGOGASTRODUODENOSCOPY  05/05/2007   GERD - one erosion  . EYE SURGERY Bilateral    cataract  . KNEE SURGERY  2002   left- cartilage   . LUNG BIOPSY    .  PERCUTANEOUS PINNING Left 06/24/2012   Procedure: Repair Complex Laceration Left Thumb with percutaneous pinning.;  Surgeon: Dennie Bible, MD;  Location: [facility];  Service: Plastics;  Laterality: Left;  . THUMB AMPUTATION Left    pinning and index finger  . TONSILLECTOMY  1956  . TONSILLECTOMY    . UPPER GASTROINTESTINAL ENDOSCOPY    . VIDEO ASSISTED THORACOSCOPY (VATS)/WEDGE RESECTION Right 01/24/2015   Procedure: RIGHT VIDEO ASSISTED THORACOSCOPY (VATS)/WEDGE RESECTION;  Surgeon: [name], MD;  Location: [facility];  Service: Thoracic;  Laterality: Right;  . vocal cord polyps removed      Current Medications: Current Meds  Medication Sig  . amLODipine (NORVASC) 5 MG tablet Take 1 tablet (5 mg total) by mouth daily.  [name] Kitchen apixaban (ELIQUIS) 5 MG TABS tablet Take 1 tablet (5 mg total) by mouth 2 (two) times daily.  . DULoxetine (CYMBALTA) 30 MG capsule Take 1 capsule (30 mg total) by mouth daily.  [name] Kitchen EPINEPHrine (EPIPEN 2-PAK) 0.3 mg/0.3 mL IJ SOAJ injection To use daily if needed for severe allergy reaction.  . irbesartan (AVAPRO) 150 MG tablet TAKE ONE TABLET BY MOUTH DAILY  . omeprazole (PRILOSEC) 20 MG capsule TAKE ONE CAPSULE BY MOUTH TWICE A DAY BEFORE MEALS  . rosuvastatin (CRESTOR) 20 MG tablet TAKE ONE TABLET BY MOUTH DAILY  . tiZANidine (ZANAFLEX) 2 MG tablet TAKE ONE TABLET BY MOUTH TWICE A DAY AS NEEDED  . [DISCONTINUED] amLODipine (NORVASC) 10 MG tablet Take 1 tablet (10 mg total) by mouth daily.     Allergies:   Codeine and Hydrocodone   Social History   Socioeconomic History  . Marital status: Widowed    Spouse name: Not on file  . Number of children: 2  . Years of education: Not on file  . Highest education level: Not on file  Occupational History  . Occupation: Retired    Fish farm manager: RETIRED  Tobacco Use  . Smoking status: Current Every Day Smoker    Packs/day: 1.00    Years: 60.00    Pack years: 60.00    Types: Cigarettes  . Smokeless tobacco: Never  Used  Vaping Use  . Vaping Use: Never used  Substance and Sexual Activity  . Alcohol use: Yes    Alcohol/week: 0.0 standard drinks    Comment: quit drinking after last hospitalization- may 2017  . Drug use: No  . Sexual activity: Not Currently  Other Topics Concern  . Not on file  Social History Narrative       Married for over 50 years.  Wife died from lung cancer.     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     Occasional  alcohol.      Tobacco use - over 50 pack years.   Father died at age 28 of suicide.   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   Social Determinants of Health   Financial Resource Strain: Low Risk   . Difficulty of Paying Living Expenses: Not hard at all  Food Insecurity: No Food Insecurity  . Worried About Charity fundraiser in the Last Year: Never true  . Ran Out of Food in the Last Year: Never true  Transportation Needs: No Transportation Needs  . Lack of Transportation (Medical): No  . Lack of Transportation (Non-Medical): No  Physical Activity: Inactive  . Days of Exercise per Week: 0 days  . Minutes of Exercise per Session: 0 min  Stress: No Stress Concern Present  . Feeling of Stress : Not at all  Social Connections: Socially Isolated  . Frequency of Communication with Friends and Family: More than three times a week  . Frequency of Social Gatherings with Friends and Family: More than three times a week  . Attends Religious Services: Never  . Active Member of Clubs or Organizations: No  . Attends Archivist Meetings: Never  . Marital Status: Widowed     Family History: The patient's family history includes Cancer (age of onset: 6) in his mother; Colon cancer (age of onset: 43) in his mother; Deep vein thrombosis in his mother; Diabetes in an other family member; Heart disease in his brother and brother; Lung cancer in his mother and son; Rheum arthritis in his daughter and sister; Uterine cancer in his  mother. There is no history of Colon polyps, Stomach cancer, or Rectal cancer.  ROS:   Please see the history of present illness.     All other systems reviewed and are negative.  EKGs/Labs/Other Studies Reviewed:    The following studies were reviewed today:   EKG:  EKG is ordered today.  The ekg ordered today demonstrates normal sinus rhythm, rate 95,  no ST/T abnormalities  TTE 08/06/15: - Left ventricle: The cavity size was normal. Wall thickness was   normal. Systolic function was vigorous. The estimated ejection   fraction was in the range of 65% to 70%. Wall motion was normal;   there were no regional wall motion abnormalities. Doppler   parameters are consistent with abnormal left ventricular   relaxation (grade 1 diastolic dysfunction).  Recent Labs: 03/14/2020: ALT 13; BNP 30.1; BUN 10; Creatinine, Ser 0.96; Potassium 4.6; Sodium 143  Recent Lipid Panel    Component Value Date/Time   CHOL 135 03/14/2020 1407   TRIG 234 (H) 03/14/2020 1407   HDL 38 (L) 03/14/2020 1407   CHOLHDL 3.6 03/14/2020 1407   CHOLHDL 4 10/15/2015 0801   VLDL 22.6 10/15/2015 0801   LDLCALC 59 03/14/2020 1407   LDLDIRECT 118.8 06/14/2006 1144    Physical Exam:    VS:  BP 94/66 (BP Location: Left Arm, Patient Position: Sitting, Cuff Size: Normal)   Pulse 95   Ht 5\' 8"  (1.727 m)   Wt 165 lb (74.8 kg)   BMI 25.09 kg/m     Wt Readings from Last 3 Encounters:  08/16/20 165 lb (74.8 kg)  08/14/20 166 lb (75.3 kg)  06/04/20 172 lb 3.2 oz (78.1 kg)     GEN: Well nourished, well developed in no acute distress HEENT: Normal NECK: No JVD LYMPHATICS: No lymphadenopathy CARDIAC: RRR, no murmurs, rubs, gallops RESPIRATORY:  Clear to auscultation without rales, wheezing or rhonchi  ABDOMEN: Soft, non-tender, non-distended MUSCULOSKELETAL: No edema; No deformity  SKIN: Warm and dry NEUROLOGIC:  Alert and oriented x 3 PSYCHIATRIC:  Normal affect   ASSESSMENT:    1. Essential hypertension    2. Lower leg edema   3. PAD (peripheral artery disease) ([place])   4. Tobacco use   5. Hyperlipidemia, unspecified hyperlipidemia type    PLAN:     Hypertension: On amlodipine, irbesartan.  Soft BP in clinic today (94/66), he is asymptomatic.  Will decrease amlodipine to 5 mg daily.  Asked to monitor BP at home and let us know if having SBP less than 100 or symptoms of lightheadedness  Lower extremity edema: Mild bilateral lower extremity edema.  Echocardiogram shows no structural heart disease.  Normal albumin.  Suspect due to amlodipine use.  Has significantly improved.  Decreasing amlodipine as above  PAD: Status post iliac artery stenting.  Continue apixaban, rosuvastatin.  Will check ABIs.  H/o DVT/PE: on Eliquis  Hyperlipidemia: Goal LDL less than 70 given PAD.  On rosuvastatin 20 mg daily.  LDL 59 on 03/14/2020  Tobacco use: Patient counseled on the risks of tobacco use and cessation strongly encouraged.  Patient is not interested in quitting smoking at this time  RTC in 6 months    Medication Adjustments/Labs and Tests Ordered: Current medicines are reviewed at length with the patient today.  Concerns regarding medicines are outlined above.  Orders Placed This Encounter  Procedures  . EKG 12-Lead   Meds ordered this encounter  Medications  . amLODipine (NORVASC) 5 MG tablet    Sig: Take 1 tablet (5 mg total) by mouth daily.    Dispense:  180 tablet    Refill:  3    Patient Instructions  Medication Instructions:   DECREASE AMLODIPINE TO 5 MG ONCE DAILY= 1/2 OF THE 10 MG TABLET ONCE DAILY  *If you need a refill on your cardiac medications before your next appointment, please call your pharmacy*  Testing/Procedures:  Your physician has requested that you have an ankle brachial index (ABI). During this test an ultrasound and blood pressure cuff are used to evaluate the arteries that supply the arms and legs with blood. Allow thirty minutes for this exam. There are no  restrictions or special instructions.     Follow-Up: At [facility], you and your health needs are our priority.  As part of our continuing mission to provide you with exceptional heart care, we have created designated Provider Care Teams.  These Care Teams include your primary Cardiologist (physician) and Advanced Practice Providers (APPs -  Physician Assistants and Nurse Practitioners) who all work together to provide you with the care you need, when you need it.  We recommend signing up for the patient portal called "MyChart".  Sign up information is provided on this After Visit Summary.  MyChart is used to connect with patients for Virtual Visits (Telemedicine).  Patients are able to view lab/test results, encounter notes, upcoming appointments, etc.  Non-urgent messages can be sent to your provider as well.   To learn more about what you can do with MyChart, go to [email or web address].    Your next appointment:   6 month(s)  The format for your next appointment:   In Person  Provider:   Oswaldo Milian, MD       Signed, Donato Heinz, MD  08/16/2020 9:04 AM    [place]

## 2020-08-16 ENCOUNTER — Other Ambulatory Visit: Payer: Self-pay

## 2020-08-16 ENCOUNTER — Ambulatory Visit (INDEPENDENT_AMBULATORY_CARE_PROVIDER_SITE_OTHER): Payer: Medicare PPO | Admitting: Cardiology

## 2020-08-16 ENCOUNTER — Encounter: Payer: Self-pay | Admitting: Cardiology

## 2020-08-16 ENCOUNTER — Telehealth: Payer: Self-pay | Admitting: Internal Medicine

## 2020-08-16 VITALS — BP 94/66 | HR 95 | Ht 68.0 in | Wt 165.0 lb

## 2020-08-16 DIAGNOSIS — Z720 Tobacco use: Secondary | ICD-10-CM

## 2020-08-16 DIAGNOSIS — I739 Peripheral vascular disease, unspecified: Secondary | ICD-10-CM

## 2020-08-16 DIAGNOSIS — E785 Hyperlipidemia, unspecified: Secondary | ICD-10-CM | POA: Diagnosis not present

## 2020-08-16 DIAGNOSIS — I10 Essential (primary) hypertension: Secondary | ICD-10-CM

## 2020-08-16 DIAGNOSIS — R600 Localized edema: Secondary | ICD-10-CM | POA: Diagnosis not present

## 2020-08-16 LAB — BASIC METABOLIC PANEL
BUN: 16 mg/dL (ref 6–23)
CO2: 22 mEq/L (ref 19–32)
Calcium: 10.1 mg/dL (ref 8.4–10.5)
Chloride: 103 mEq/L (ref 96–112)
Creatinine, Ser: 1.12 mg/dL (ref 0.40–1.50)
GFR: 62.52 mL/min (ref >60.00)
Glucose, Bld: 97 mg/dL (ref 70–99)
Potassium: 5.4 mEq/L — ABNORMAL HIGH (ref 3.5–5.1)
Sodium: 144 mEq/L (ref 135–145)

## 2020-08-16 LAB — HEPATIC FUNCTION PANEL
ALT: 27 U/L (ref 0–53)
AST: 22 U/L (ref 0–37)
Albumin: 4.4 g/dL (ref 3.5–5.2)
Alkaline Phosphatase: 93 U/L (ref 39–117)
Bilirubin, Direct: 0.1 mg/dL (ref 0.0–0.3)
Total Bilirubin: 0.3 mg/dL (ref 0.2–1.2)
Total Protein: 7.1 g/dL (ref 6.0–8.3)

## 2020-08-16 LAB — MAGNESIUM: Magnesium: 1.9 mg/dL (ref 1.5–2.5)

## 2020-08-16 MED ORDER — AMLODIPINE BESYLATE 5 MG PO TABS: 5.0000 mg | ORAL_TABLET | Freq: Every day | ORAL | 3 refills | Status: DC

## 2020-08-16 NOTE — Telephone Encounter (Signed)
Pt called in to r/s his appts missed in March. I called pt, no answer. Left msg for pt to call back to r/s.

## 2020-08-16 NOTE — Patient Instructions (Signed)
Medication Instructions:   DECREASE AMLODIPINE TO 5 MG ONCE DAILY= 1/2 OF THE 10 MG TABLET ONCE DAILY  *If you need a refill on your cardiac medications before your next appointment, please call your pharmacy*  Testing/Procedures:  Your physician has requested that you have an ankle brachial index (ABI). During this test an ultrasound and blood pressure cuff are used to evaluate the arteries that supply the arms and legs with blood. Allow thirty minutes for this exam. There are no restrictions or special instructions.     Follow-Up: At [facility], you and your health needs are our priority.  As part of our continuing mission to provide you with exceptional heart care, we have created designated Provider Care Teams.  These Care Teams include your primary Cardiologist (physician) and Advanced Practice Providers (APPs -  Physician Assistants and Nurse Practitioners) who all work together to provide you with the care you need, when you need it.  We recommend signing up for the patient portal called "MyChart".  Sign up information is provided on this After Visit Summary.  MyChart is used to connect with patients for Virtual Visits (Telemedicine).  Patients are able to view lab/test results, encounter notes, upcoming appointments, etc.  Non-urgent messages can be sent to your provider as well.   To learn more about what you can do with MyChart, go to [email or web address].    Your next appointment:   6 month(s)  The format for your next appointment:   In Person  Provider:   Oswaldo Milian, MD

## 2020-08-20 ENCOUNTER — Other Ambulatory Visit: Payer: Self-pay

## 2020-08-20 ENCOUNTER — Other Ambulatory Visit (INDEPENDENT_AMBULATORY_CARE_PROVIDER_SITE_OTHER): Payer: Medicare PPO

## 2020-08-20 DIAGNOSIS — E875 Hyperkalemia: Secondary | ICD-10-CM

## 2020-08-21 LAB — POTASSIUM: Potassium: 4.1 mEq/L (ref 3.5–5.1)

## 2020-08-28 ENCOUNTER — Inpatient Hospital Stay: Payer: Medicare PPO | Admitting: Internal Medicine

## 2020-08-28 ENCOUNTER — Other Ambulatory Visit: Payer: Self-pay

## 2020-08-29 ENCOUNTER — Telehealth: Payer: Self-pay | Admitting: Internal Medicine

## 2020-08-29 NOTE — Telephone Encounter (Signed)
Scheduled  appointment per 06/16 sch msg. Patient is aware.

## 2020-09-03 ENCOUNTER — Other Ambulatory Visit: Payer: Self-pay | Admitting: Cardiology

## 2020-09-03 DIAGNOSIS — I739 Peripheral vascular disease, unspecified: Secondary | ICD-10-CM

## 2020-09-03 DIAGNOSIS — Z95828 Presence of other vascular implants and grafts: Secondary | ICD-10-CM

## 2020-09-06 ENCOUNTER — Ambulatory Visit (HOSPITAL_COMMUNITY)
Admission: RE | Admit: 2020-09-06 | Discharge: 2020-09-06 | Disposition: A | Discharge status: Home / Self Care | Payer: Medicare PPO | Source: Ambulatory Visit | Attending: Cardiology | Admitting: Cardiology

## 2020-09-06 ENCOUNTER — Ambulatory Visit (HOSPITAL_BASED_OUTPATIENT_CLINIC_OR_DEPARTMENT_OTHER)
Admission: RE | Admit: 2020-09-06 | Discharge: 2020-09-06 | Disposition: A | Discharge status: Home / Self Care | Payer: Medicare PPO | Source: Ambulatory Visit | Attending: Cardiology | Admitting: Cardiology

## 2020-09-06 ENCOUNTER — Other Ambulatory Visit: Payer: Self-pay

## 2020-09-06 DIAGNOSIS — I739 Peripheral vascular disease, unspecified: Secondary | ICD-10-CM | POA: Diagnosis not present

## 2020-09-06 DIAGNOSIS — Z95828 Presence of other vascular implants and grafts: Secondary | ICD-10-CM | POA: Diagnosis not present

## 2020-09-10 ENCOUNTER — Encounter (HOSPITAL_COMMUNITY): Payer: Self-pay

## 2020-09-10 ENCOUNTER — Inpatient Hospital Stay: Payer: Medicare PPO | Attending: Internal Medicine

## 2020-09-10 ENCOUNTER — Ambulatory Visit (HOSPITAL_COMMUNITY)
Admission: RE | Admit: 2020-09-10 | Discharge: 2020-09-10 | Disposition: A | Discharge status: Home / Self Care | Payer: Medicare PPO | Source: Ambulatory Visit | Attending: Internal Medicine | Admitting: Internal Medicine

## 2020-09-10 ENCOUNTER — Other Ambulatory Visit: Payer: Self-pay

## 2020-09-10 DIAGNOSIS — C3490 Malignant neoplasm of unspecified part of unspecified bronchus or lung: Secondary | ICD-10-CM

## 2020-09-10 DIAGNOSIS — J9811 Atelectasis: Secondary | ICD-10-CM | POA: Diagnosis not present

## 2020-09-10 DIAGNOSIS — R911 Solitary pulmonary nodule: Secondary | ICD-10-CM | POA: Diagnosis not present

## 2020-09-10 DIAGNOSIS — I700 Atherosclerosis of aorta: Secondary | ICD-10-CM | POA: Diagnosis not present

## 2020-09-10 DIAGNOSIS — J439 Emphysema, unspecified: Secondary | ICD-10-CM | POA: Diagnosis not present

## 2020-09-10 LAB — CMP (CANCER CENTER ONLY)
ALT: 9 U/L (ref 0–44)
AST: 14 U/L — ABNORMAL LOW (ref 15–41)
Albumin: 3.7 g/dL (ref 3.5–5.0)
Alkaline Phosphatase: 77 U/L (ref 38–126)
Anion gap: 11 (ref 5–15)
BUN: 9 mg/dL (ref 8–23)
CO2: 24 mmol/L (ref 22–32)
Calcium: 8.7 mg/dL — ABNORMAL LOW (ref 8.9–10.3)
Chloride: 107 mmol/L (ref 98–111)
Creatinine: 0.95 mg/dL (ref 0.61–1.24)
GFR, Estimated: >60 mL/min (ref >60)
Glucose, Bld: 98 mg/dL (ref 70–99)
Potassium: 4.3 mmol/L (ref 3.5–5.1)
Sodium: 142 mmol/L (ref 135–145)
Total Bilirubin: 0.8 mg/dL (ref 0.3–1.2)
Total Protein: 6.9 g/dL (ref 6.5–8.1)

## 2020-09-10 LAB — CBC WITH DIFFERENTIAL (CANCER CENTER ONLY)
Abs Immature Granulocytes: 0.01 10*3/uL (ref 0.00–0.07)
Basophils Absolute: 0 10*3/uL (ref 0.0–0.1)
Basophils Relative: 1 %
Eosinophils Absolute: 0.1 10*3/uL (ref 0.0–0.5)
Eosinophils Relative: 1 %
HCT: 45 % (ref 39.0–52.0)
Hemoglobin: 14.9 g/dL (ref 13.0–17.0)
Immature Granulocytes: 0 %
Lymphocytes Relative: 21 %
Lymphs Abs: 1.2 10*3/uL (ref 0.7–4.0)
MCH: 31.1 pg (ref 26.0–34.0)
MCHC: 33.1 g/dL (ref 30.0–36.0)
MCV: 93.9 fL (ref 80.0–100.0)
Monocytes Absolute: 0.7 10*3/uL (ref 0.1–1.0)
Monocytes Relative: 12 %
Neutro Abs: 3.6 10*3/uL (ref 1.7–7.7)
Neutrophils Relative %: 65 %
Platelet Count: 172 10*3/uL (ref 150–400)
RBC: 4.79 MIL/uL (ref 4.22–5.81)
RDW: 14.1 % (ref 11.5–15.5)
WBC Count: 5.5 10*3/uL (ref 4.0–10.5)
nRBC: 0 % (ref 0.0–0.2)

## 2020-09-10 MED ORDER — SODIUM CHLORIDE (PF) 0.9 % IJ SOLN
INTRAMUSCULAR | Status: AC
  Filled 2020-09-10: qty 50

## 2020-09-10 MED ORDER — IOHEXOL 300 MG/ML  SOLN
75.0000 mL | Freq: Once | INTRAMUSCULAR | Status: AC | PRN
  Administered 2020-09-10: 75 mL via INTRAVENOUS

## 2020-09-17 ENCOUNTER — Inpatient Hospital Stay: Payer: Medicare PPO | Attending: Internal Medicine | Admitting: Internal Medicine

## 2020-09-17 ENCOUNTER — Other Ambulatory Visit: Payer: Self-pay

## 2020-09-17 VITALS — BP 139/94 | HR 96 | Temp 98.0°F | Resp 19 | Ht 68.0 in | Wt 163.6 lb

## 2020-09-17 DIAGNOSIS — R0609 Other forms of dyspnea: Secondary | ICD-10-CM | POA: Insufficient documentation

## 2020-09-17 DIAGNOSIS — C3490 Malignant neoplasm of unspecified part of unspecified bronchus or lung: Secondary | ICD-10-CM | POA: Diagnosis not present

## 2020-09-17 DIAGNOSIS — C342 Malignant neoplasm of middle lobe, bronchus or lung: Secondary | ICD-10-CM

## 2020-09-17 DIAGNOSIS — R911 Solitary pulmonary nodule: Secondary | ICD-10-CM | POA: Insufficient documentation

## 2020-09-17 DIAGNOSIS — Z85118 Personal history of other malignant neoplasm of bronchus and lung: Secondary | ICD-10-CM | POA: Diagnosis not present

## 2020-09-17 DIAGNOSIS — Z902 Acquired absence of lung [part of]: Secondary | ICD-10-CM | POA: Diagnosis not present

## 2020-09-17 NOTE — Progress Notes (Signed)
[place] [phone]   [phone]  OFFICE PROGRESS NOTE  [name], [name], MD [place] [address]  DIAGNOSIS: Stage IA (T1a, N0, M0) non-small cell lung cancer, adenocarcinoma  diagnosed in April 2016.  PRIOR THERAPY: Status post right VATS with wedge resection of the right middle lobe as well as wedge resection of the right upper lobe and pleural biopsy under the care of [name] on 01/24/2015.  CURRENT THERAPY: Observation.  INTERVAL HISTORY: Timothy Wood 80 y.o. male returns to the clinic today for annual follow-up visit.  The patient is feeling fine today with no concerning complaints.  He mentioned that he was diagnosed with pneumonia 2 months ago and he is feeling much better now.  He denied having any chest pain, shortness of breath except with exertion with no cough or hemoptysis.  He denied having any fever or chills.  He has no nausea, vomiting, diarrhea or constipation.  He had repeat CT scan of the chest performed recently and is here for evaluation and discussion of his discuss results.   MEDICAL HISTORY: Past Medical History:  Diagnosis Date   Adenocarcinoma of lung, stage 1 ([place])    Anemia    Angiodysplasia of cecum 11/23/2017   Multiple 1-2 mm   Arthritis    back possibly   Blood clotting disorder (H[facility])    Cataract    removed bilaterally    Chronic obstructive pulmonary disease (COPD) ([place])    "patient not aware"   Clotting disorder ([place])    PE LUNG, dvt leg    Colon polyps    Depression    Diverticulosis    DVT (deep venous thrombosis) ([place])    Post op  knee surgery   DVT (deep venous thrombosis) (H[facility])    GERD (gastroesophageal reflux disease)    Histoplasmosis    Histoplasmosis with pneumonia ([place]) 04/01/2015   Hypercholesteremia    Hyperlipidemia    Hypertension    Laceration of head 08/05/2015   Lumbar compression fracture ([place])    Lung cancer ([facility]) dx'd 2016   Personal  history of colonic polyps 2008   multiple adenomas 2008-2009 exams with large cecal adenoma   Pneumonia    in history   Polyposis coli - attenuated 09/12/2010   2008: adenomas with 9 polyps index and 2.5 cm cecal polyp removed over 3 exams, last 2009 with APC. diverticulosis also. 11/24/2010: 12 polyps removed - adenomas Anticipate routine repeat colonoscopy Spring 2013 Have recommended he see genetics counselor re ? Polyposis 03/27/2016 14 polyps removed max 15 mm    Pulmonary nodule 06/2006   9 mm right upper obe pulmonary nodule (negative Pet Scan)   PVD (peripheral vascular disease) (HCC)    left leg s/p stent   Sebaceous cyst    neck   Seborrheic keratosis    Seizures ([place])    4-5 yrs ago had seizures when in ICU- none since this time    Sinusitis    Tobacco abuse    Tremor     ALLERGIES:  is allergic to codeine and hydrocodone.  MEDICATIONS:  Current Outpatient Medications  Medication Sig Dispense Refill   amLODipine (NORVASC) 5 MG tablet Take 1 tablet (5 mg total) by mouth daily. 180 tablet 3   apixaban (ELIQUIS) 5 MG TABS tablet Take 1 tablet (5 mg total) by mouth 2 (two) times daily. 180 tablet 2   DULoxetine (CYMBALTA) 30 MG capsule Take 1 capsule (  30 mg total) by mouth daily. 90 capsule 2   EPINEPHrine (EPIPEN 2-PAK) 0.3 mg/0.3 mL IJ SOAJ injection To use daily if needed for severe allergy reaction. 1 each 0   irbesartan (AVAPRO) 150 MG tablet TAKE ONE TABLET BY MOUTH DAILY 90 tablet 1   omeprazole (PRILOSEC) 20 MG capsule TAKE ONE CAPSULE BY MOUTH TWICE A DAY BEFORE MEALS 60 capsule 11   rosuvastatin (CRESTOR) 20 MG tablet TAKE ONE TABLET BY MOUTH DAILY 90 tablet 1   tiZANidine (ZANAFLEX) 2 MG tablet TAKE ONE TABLET BY MOUTH TWICE A DAY AS NEEDED 60 tablet 3   No current facility-administered medications for this visit.    SURGICAL HISTORY:  Past Surgical History:  Procedure Laterality Date   ANGIOPLASTY / STENTING ILIAC  08/2008   Dr. Gray Bernhardt SURGERY  1975,  1978   x2 lumbar fusion over 30 years ago   BACK SURGERY     COLONOSCOPY  05/11/2006; 07/27/2006;05/05/2007, 11/24/2010   adenomas with 9 polyps index and 2.5 cm cecal polyp removed over 3 exams, last 2009 with APC. diverticulosis also.  2012: 12 adenomas removed, largest 8-10 mm, diverticulosis   CORONARY ANGIOPLASTY WITH STENT PLACEMENT     DESCENDING AORTIC ANEURYSM REPAIR W/ STENT  04/2010   Dr. Trula Slade   ESOPHAGOGASTRODUODENOSCOPY  05/05/2007   GERD - one erosion   EYE SURGERY Bilateral    cataract   KNEE SURGERY  2002   left- cartilage    LUNG BIOPSY     PERCUTANEOUS PINNING Left 06/24/2012   Procedure: Repair Complex Laceration Left Thumb with percutaneous pinning.;  Surgeon: Dennie Bible, MD;  Location: [facility];  Service: Plastics;  Laterality: Left;   THUMB AMPUTATION Left    pinning and index finger   TONSILLECTOMY  1956   TONSILLECTOMY     UPPER GASTROINTESTINAL ENDOSCOPY     VIDEO ASSISTED THORACOSCOPY (VATS)/WEDGE RESECTION Right 01/24/2015   Procedure: RIGHT VIDEO ASSISTED THORACOSCOPY (VATS)/WEDGE RESECTION;  Surgeon: [name], MD;  Location: [facility];  Service: Thoracic;  Laterality: Right;   vocal cord polyps removed      REVIEW OF SYSTEMS:  A comprehensive review of systems was negative except for: Respiratory: positive for dyspnea on exertion   PHYSICAL EXAMINATION: General appearance: alert, cooperative, and no distress Head: Normocephalic, without obvious abnormality, atraumatic Neck: no adenopathy, no JVD, supple, symmetrical, trachea midline, and thyroid not enlarged, symmetric, no tenderness/mass/nodules Lymph nodes: Cervical, supraclavicular, and axillary nodes normal. Resp: clear to auscultation bilaterally Back: symmetric, no curvature. ROM normal. No CVA tenderness. Cardio: regular rate and rhythm, S1, S2 normal, no murmur, click, rub or gallop GI: soft, non-tender; bowel sounds normal; no masses,  no organomegaly Extremities: extremities normal,  atraumatic, no cyanosis or edema  ECOG PERFORMANCE STATUS: 1 - Symptomatic but completely ambulatory  Blood pressure (!) 139/94, pulse 96, temperature 98 F (36.7 C), temperature source Tympanic, resp. rate 19, height 5\' 8"  (1.727 m), weight 163 lb 9.6 oz (74.2 kg), SpO2 97 %.  LABORATORY DATA: Lab Results  Component Value Date   WBC 5.5 09/10/2020   HGB 14.9 09/10/2020   HCT 45.0 09/10/2020   MCV 93.9 09/10/2020   PLT 172 09/10/2020      Chemistry      Component Value Date/Time   NA 142 09/10/2020 1103   NA 143 03/14/2020 1407   NA 143 10/12/2016 0958   K 4.3 09/10/2020 1103   K 3.7 10/12/2016 0958   CL 107 09/10/2020  1103   CO2 24 09/10/2020 1103   CO2 26 10/12/2016 0958   BUN 9 09/10/2020 1103   BUN 10 03/14/2020 1407   BUN 17.8 10/12/2016 0958   CREATININE 0.95 09/10/2020 1103   CREATININE 1.2 10/12/2016 0958      Component Value Date/Time   CALCIUM 8.7 (L) 09/10/2020 1103   CALCIUM 9.2 10/12/2016 0958   ALKPHOS 77 09/10/2020 1103   ALKPHOS 92 10/12/2016 0958   AST 14 (L) 09/10/2020 1103   AST 16 10/12/2016 0958   ALT 9 09/10/2020 1103   ALT 16 10/12/2016 0958   BILITOT 0.8 09/10/2020 1103   BILITOT 0.76 10/12/2016 0958       RADIOGRAPHIC STUDIES: CT Chest W Contrast  Result Date: 09/11/2020 CLINICAL DATA:  Non-small-cell lung cancer.  Restaging. EXAM: CT CHEST WITH CONTRAST TECHNIQUE: Multidetector CT imaging of the chest was performed during intravenous contrast administration. CONTRAST:  53mL OMNIPAQUE IOHEXOL 300 MG/ML  SOLN COMPARISON:  05/26/2019 FINDINGS: Cardiovascular: The heart size is normal. No substantial pericardial effusion. Coronary artery calcification is evident. Atherosclerotic calcification is noted in the wall of the thoracic aorta. Mediastinum/Nodes: No mediastinal lymphadenopathy. 9 mm short axis subcarinal lymph node is upper limits of normal for size and mildly increased in the interval. There is no hilar lymphadenopathy. The esophagus  has normal imaging features. Lungs/Pleura: Centrilobular emphsyema noted. Stable appearance of the right lung staple line. New small focus peripheral irregular nodular opacity in the posterior right lower lobe (image 130/7) may be infectious/inflammatory. Progression of atelectasis noted medial left lower lobe with 9 mm nodular component now identified in the paraspinal lung on image 140/7. Small focus of architectural distortion in the retro hilar left lower lobe on 112/7 is new in the interval. Subtle pleural nodularity anterior left hemithorax (image 70/7) is similar to prior. There is no evidence of pleural effusion. Upper Abdomen: Similar appearance of multiple hypoattenuating liver lesions, mainly in the left hepatic lobe, likely cysts. Exophytic cyst noted upper pole right kidney. Tiny left adrenal nodule is unchanged. Musculoskeletal: No worrisome lytic or sclerotic osseous abnormality. Old right rib fractures evident. IMPRESSION: 1. No definite findings of recurrent or metastatic disease on today's study. Slight interval increase in upper normal subcarinal lymph node today. Attention on follow-up recommended. 2. New small focus of peripheral irregular nodular opacity in the posterior right lower lobe, likely infectious/inflammatory. 3. Progression of atelectasis medial left lower lobe with 9 mm nodular component now identified in the paraspinal lung. Attention on follow-up recommended. 4. Similar appearance of subtle pleural nodularity anterior left hemithorax. 5. Aortic Atherosclerosis (ICD10-I70.0) and Emphysema (ICD10-J43.9). Electronically Signed   By: Misty Stanley M.D.   On: 09/11/2020 07:46   VAS [place] ABI WITH/WO TBI  Result Date: 09/06/2020  LOWER EXTREMITY DOPPLER STUDY Patient Name:  Timothy Wood  Date of Exam:   09/06/2020 Medical Rec #: [id number]         Accession #:    [id number] Date of Birth: 10/15/1940        Patient Gender: M Patient Age:   41Y Exam Location:  [facility] Procedure:       VAS [place] ABI WITH/WO TBI Referring Phys: [id number] [place] --------------------------------------------------------------------------------  Indications: Peripheral artery disease. High Risk Factors: Hypertension, hyperlipidemia, current smoker, coronary artery                    disease. Other Factors: Slow to heal wounds on legs occasionally (none currently).  Vascular Interventions: 09/12/10 left  common iliac artery stent.                         04/29/10 distal aorta stent. Comparison Study: 06/08/11 showed elevated velocities in the distal aorta (238                   cm/sec) and left common iliac artery (306 cm/sec), with normal                   ABI's. Performing Technologist: [employer] BS, RVT, RDCS  Examination Guidelines: A complete evaluation includes at minimum, Doppler waveform signals and systolic blood pressure reading at the level of bilateral brachial, anterior tibial, and posterior tibial arteries, when vessel segments are accessible. Bilateral testing is considered an integral part of a complete examination. Photoelectric Plethysmograph (PPG) waveforms and toe systolic pressure readings are included as required and additional duplex testing as needed. Limited examinations for reoccurring indications may be performed as noted.  ABI Findings: +---------+------------------+-----+---------+--------+ Right    Rt Pressure (mmHg)IndexWaveform Comment  +---------+------------------+-----+---------+--------+ Brachial 148                                      +---------+------------------+-----+---------+--------+ ATA      143               0.97 triphasic         +---------+------------------+-----+---------+--------+ PTA      158               1.07 triphasic         +---------+------------------+-----+---------+--------+ PERO     148               1.00 triphasic         +---------+------------------+-----+---------+--------+ Great Toe95                0.64  Normal            +---------+------------------+-----+---------+--------+ +---------+------------------+-----+---------+-------+ Left     Lt Pressure (mmHg)IndexWaveform Comment +---------+------------------+-----+---------+-------+ Brachial 145                                     +---------+------------------+-----+---------+-------+ ATA      170               1.15 triphasic        +---------+------------------+-----+---------+-------+ PTA      168               1.14 triphasic        +---------+------------------+-----+---------+-------+ PERO     166               1.12 triphasic        +---------+------------------+-----+---------+-------+ Great Toe92                0.62 Normal           +---------+------------------+-----+---------+-------+ +-------+-----------+-----------+------------+------------+ ABI/TBIToday's ABIToday's TBIPrevious ABIPrevious TBI +-------+-----------+-----------+------------+------------+ Right  1.07       .64        1.15                     +-------+-----------+-----------+------------+------------+ Left   1.15       .62        1.19                     +-------+-----------+-----------+------------+------------+  Bilateral ABIs appear essentially unchanged compared to prior study on 06/08/11.  Summary: Right: Resting right ankle-brachial index is within normal range. No evidence of significant right lower extremity arterial disease. The right toe-brachial index is abnormal. Left: Resting left ankle-brachial index is within normal range. No evidence of significant left lower extremity arterial disease. The left toe-brachial index is abnormal.  *See table(s) above for measurements and observations.  Electronically signed by Jenkins Rouge MD on 09/06/2020 at 3:57:20 PM.    Final    VAS US AORTA/IVC/ILIACS  Result Date: 09/06/2020 ABDOMINAL AORTA STUDY Patient Name:  Timothy Wood [facility]  Date of Exam:   09/06/2020 Medical Rec #: [id number]          Accession #:    [id number] Date of Birth: 09/07/1940        Patient Gender: M Patient Age:   79Y Exam Location:  [facility] Procedure:      VAS US AORTA/IVC/ILIACS Referring Phys: [id number] [place] --------------------------------------------------------------------------------  Indications: Aorta and iliac stents. Risk Factors: Hypertension, hyperlipidemia, current smoker. Other Factors: Slow to heal wounds on legs occasionally (none currently). Vascular Interventions: 09/12/10 left common iliac artery stent.                         04/29/10 distal aorta stent.  Comparison Study: 06/08/11 showed elevated velocities in the distal aorta (238                   cm/sec) and left common iliac artery (306 cm/sec), with normal                   ABI's. Performing Technologist: [employer] BS, RVT, RDCS  Examination Guidelines: A complete evaluation includes B-mode imaging, spectral Doppler, color Doppler, and power Doppler as needed of all accessible portions of each vessel. Bilateral testing is considered an integral part of a complete examination. Limited examinations for reoccurring indications may be performed as noted.  Abdominal Aorta Findings: +-------------+-------+----------+----------+--------+--------+--------+ Location     AP (cm)Trans (cm)PSV (cm/s)WaveformThrombusComments +-------------+-------+----------+----------+--------+--------+--------+ Proximal     1.60   1.60      90        biphasic                 +-------------+-------+----------+----------+--------+--------+--------+ Mid                           98        biphasic                 +-------------+-------+----------+----------+--------+--------+--------+ Distal                        102       biphasic        in-stent +-------------+-------+----------+----------+--------+--------+--------+ RT CIA Prox                   145       biphasic                  +-------------+-------+----------+----------+--------+--------+--------+ RT CIA Mid                    220       biphasic                 +-------------+-------+----------+----------+--------+--------+--------+ RT CIA Distal  122       biphasic                 +-------------+-------+----------+----------+--------+--------+--------+ RT EIA Prox                   121       biphasic                 +-------------+-------+----------+----------+--------+--------+--------+ RT EIA Mid                    92        biphasic                 +-------------+-------+----------+----------+--------+--------+--------+ RT EIA Distal                 118       biphasic                 +-------------+-------+----------+----------+--------+--------+--------+ LT CIA Prox                   122       biphasic                 +-------------+-------+----------+----------+--------+--------+--------+ LT CIA Mid                    267       biphasic                 +-------------+-------+----------+----------+--------+--------+--------+ LT CIA Distal                 174       biphasic                 +-------------+-------+----------+----------+--------+--------+--------+ LT EIA Prox                   95        biphasic                 +-------------+-------+----------+----------+--------+--------+--------+ LT EIA Mid                    77        biphasic                 +-------------+-------+----------+----------+--------+--------+--------+ LT EIA Distal                 122       biphasic                 +-------------+-------+----------+----------+--------+--------+--------+ Stent struts seen in distal aorta but not in the left common iliac artery, so all is documented here. Aorto-iliac atherosclerosis noted. IVC/Iliac Findings: +--------+------+--------+--------+   IVC   PatentThrombusComments +--------+------+--------+--------+ IVC  Proxpatent                 +--------+------+--------+--------+    Summary: Abdominal Aorta: No evidence of an abdominal aortic aneurysm was visualized. Stenosis: +------------------+-------------+---------------+ Location          Stenosis     Stent           +------------------+-------------+---------------+ Right Common Iliac>50% stenosis                +------------------+-------------+---------------+ Left Common Iliac              50-99% stenosis +------------------+-------------+---------------+ Aorto-iliac atherosclerosis. Elevated but stable velocities in the left common iliac artery stent.  *See table(s) above for measurements and observations.  Electronically signed by Collier Salina  Nishan MD on 09/06/2020 at 3:59:06 PM.    Final      ASSESSMENT AND PLAN:  This is a very pleasant 80 years old white male with stage IA non-small cell lung cancer diagnosed in April 2016 and has been observation since that time. The patient is feeling fine today with no concerning complaints except for shortness of breath with exertion. He had repeat CT scan of the chest performed recently.  I personally and independently reviewed the scan images and discussed the result and showed the images to the patient today. His scan showed no concerning findings for disease recurrence or metastasis but there was progression of atelectasis medial left lower lobe with 9 mm nodular lesion that need close attention. I recommended for the patient to have repeat CT scan of the chest in around 6 months for further evaluation of this lesion and to rule out any recurrence. He was advised to call immediately if he has any other concerning symptoms in the interval. The patient voices understanding of current disease status and treatment options and is in agreement with the current care plan. All questions were answered. The patient knows to call the clinic with any problems, questions or concerns. We can certainly see the patient  much sooner if necessary.   Disclaimer: This note was dictated with voice recognition software. Similar sounding words can inadvertently be transcribed and may not be corrected upon review.

## 2020-09-19 ENCOUNTER — Encounter: Payer: Self-pay | Admitting: *Deleted

## 2020-09-19 ENCOUNTER — Telehealth: Payer: Self-pay | Admitting: Internal Medicine

## 2020-09-19 NOTE — Telephone Encounter (Signed)
Scheduled appointment per 07/05 los. Patient is aware. 

## 2020-11-05 ENCOUNTER — Other Ambulatory Visit: Payer: Self-pay | Admitting: Cardiology

## 2020-11-11 ENCOUNTER — Other Ambulatory Visit: Payer: Self-pay

## 2020-11-19 ENCOUNTER — Other Ambulatory Visit: Payer: Self-pay | Admitting: *Deleted

## 2020-11-19 MED ORDER — AMLODIPINE BESYLATE 5 MG PO TABS: 5.0000 mg | ORAL_TABLET | Freq: Every day | ORAL | 3 refills | Status: DC

## 2020-12-10 ENCOUNTER — Other Ambulatory Visit: Payer: Self-pay | Admitting: Family Medicine

## 2020-12-10 DIAGNOSIS — K219 Gastro-esophageal reflux disease without esophagitis: Secondary | ICD-10-CM

## 2020-12-10 DIAGNOSIS — E785 Hyperlipidemia, unspecified: Secondary | ICD-10-CM

## 2020-12-20 ENCOUNTER — Other Ambulatory Visit: Payer: Self-pay

## 2020-12-20 DIAGNOSIS — I10 Essential (primary) hypertension: Secondary | ICD-10-CM

## 2020-12-20 MED ORDER — IRBESARTAN 150 MG PO TABS: 150.0000 mg | ORAL_TABLET | Freq: Every day | ORAL | 1 refills | Status: DC

## 2021-02-13 NOTE — Progress Notes (Signed)
Chief Complaint  Patient presents with   Follow-up  HPI:  [name] is a 80 y.o. male hx of HTN,PAD,HLD, unstable gait,lung cancer,COPD,CAD,and chronic pain who is here today for chronic disease management. Last seen on 08/25/20. No new problems since last visit.  Hypertension:  Medications:Irbesartan 150 mg daily and Amlodipine 5 mg daily. BP readings at home:Not checking. Side effects:None.  Negative for unusual or severe headache, visual changes, exertional chest pain, dyspnea,  focal weakness, or edema.  Lab Results  Component Value Date   CREATININE 0.95 09/10/2020   BUN 9 09/10/2020   NA 142 09/10/2020   K 4.3 09/10/2020   CL 107 09/10/2020   CO2 24 09/10/2020   Hx of PE, he is on Eliquis 5 mg bid. He has tolerated medication well. Negative for blood in stool,nose/gum bleeding,or worsening bruising.  Hyperlipidemia: Currently on Rosuvastatin 20 mg daily. Following a low fat diet: Yes. Side effects from medication:None.  Aortic atherosclerosis seen on imaging, chest CT 08/2020.  Lab Results  Component Value Date   CHOL 135 03/14/2020   HDL 38 (L) 03/14/2020   LDLCALC 59 03/14/2020   LDLDIRECT 118.8 06/14/2006   TRIG 234 (H) 03/14/2020   CHOLHDL 3.6 03/14/2020   Fell 3 months ago chasing a cat, slip on water, left rib cage pain for a few days. Did not seek medical attention. Denies head trauma. He was able to get up with no help.  Lower back pain. He is on Duloxetine 30 mg, he does not feel like it is helping with problem. Depression, he denies depressed mood. Feels sad when he thinks about the death of her wife and son, a few years ago.  Review of Systems  Respiratory:  Positive for cough. Negative for shortness of breath and wheezing.   Gastrointestinal:  Negative for abdominal pain, nausea and vomiting.  Genitourinary:  Negative for decreased urine volume and hematuria.  Musculoskeletal:  Positive for arthralgias and gait problem.   Neurological:  Negative for syncope and facial asymmetry.  Psychiatric/Behavioral:  Negative for confusion.   Rest of ROS see pertinent positives and negatives in HPI.  Current Outpatient Medications on File Prior to Visit  Medication Sig Dispense Refill   amLODipine (NORVASC) 5 MG tablet Take 1 tablet (5 mg total) by mouth daily. 90 tablet 3   apixaban (ELIQUIS) 5 MG TABS tablet Take 1 tablet (5 mg total) by mouth 2 (two) times daily. 180 tablet 2   DULoxetine (CYMBALTA) 30 MG capsule Take 1 capsule (30 mg total) by mouth daily. 90 capsule 2   EPINEPHrine (EPIPEN 2-PAK) 0.3 mg/0.3 mL IJ SOAJ injection To use daily if needed for severe allergy reaction. 1 each 0   irbesartan (AVAPRO) 150 MG tablet Take 1 tablet (150 mg total) by mouth daily. 90 tablet 1   omeprazole (PRILOSEC) 20 MG capsule TAKE ONE CAPSULE BY MOUTH TWICE A DAY BEFORE MEALS 180 capsule 1   rosuvastatin (CRESTOR) 20 MG tablet TAKE ONE TABLET BY MOUTH DAILY 90 tablet 1   No current facility-administered medications on file prior to visit.    Past Medical History:  Diagnosis Date   Adenocarcinoma of lung, stage 1 ([place])    Anemia    Angiodysplasia of cecum 11/23/2017   Multiple 1-2 mm   Arthritis    back possibly   Blood clotting disorder (HCC)    Cataract    removed bilaterally    Chronic obstructive pulmonary disease (COPD) ([place])    "patient  not aware"   Clotting disorder ([place])    PE LUNG, dvt leg    Colon polyps    Depression    Diverticulosis    DVT (deep venous thrombosis) ([place])    Post op  knee surgery   DVT (deep venous thrombosis) (HCC)    GERD (gastroesophageal reflux disease)    Histoplasmosis    Histoplasmosis with pneumonia ([place]) 04/01/2015   Hypercholesteremia    Hyperlipidemia    Hypertension    Laceration of head 08/05/2015   Lumbar compression fracture ([place])    Lung cancer ([place]) dx'd 2016   Personal history of colonic polyps 2008   multiple adenomas 2008-2009 exams with large cecal adenoma    Pneumonia    in history   Polyposis coli - attenuated 09/12/2010   2008: adenomas with 9 polyps index and 2.5 cm cecal polyp removed over 3 exams, last 2009 with APC. diverticulosis also. 11/24/2010: 12 polyps removed - adenomas Anticipate routine repeat colonoscopy Spring 2013 Have recommended he see genetics counselor re ? Polyposis 03/27/2016 14 polyps removed max 15 mm    Pulmonary nodule 06/2006   9 mm right upper obe pulmonary nodule (negative Pet Scan)   PVD (peripheral vascular disease) (HCC)    left leg s/p stent   Sebaceous cyst    neck   Seborrheic keratosis    Seizures ([place])    4-5 yrs ago had seizures when in ICU- none since this time    Sinusitis    Tobacco abuse    Tremor     Allergies  Allergen Reactions   Codeine Anaphylaxis   Hydrocodone Itching    Social History   Socioeconomic History   Marital status: Widowed    Spouse name: Not on file   Number of children: 2   Years of education: Not on file   Highest education level: Not on file  Occupational History   Occupation: Retired    Fish farm manager: RETIRED  Tobacco Use   Smoking status: Every Day    Packs/day: 1.00    Years: 60.00    Pack years: 60.00    Types: Cigarettes   Smokeless tobacco: Never  Vaping Use   Vaping Use: Never used  Substance and Sexual Activity   Alcohol use: Yes    Alcohol/week: 0.0 standard drinks    Comment: quit drinking after last hospitalization- may 2017   Drug use: No   Sexual activity: Not Currently  Other Topics Concern   Not on file  Social History Narrative       Married for over 50 years.  Wife died from lung cancer.     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     Occasional alcohol.      Tobacco use - over 50 pack years.   Father died at age 53 of suicide.   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   Social Determinants of Health   Financial Resource Strain: Low Risk    Difficulty of Paying Living Expenses: Not hard at all  Food  Insecurity: No Food Insecurity   Worried About Charity fundraiser in the Last Year: Never true   Arboriculturist in the Last Year: Never true  Transportation Needs: No Transportation Needs   Lack of Transportation (Medical): No   Lack of Transportation (Non-Medical): No  Physical Activity: Inactive   Days of Exercise per Week: 0 days   Minutes of Exercise per Session: 0  min  Stress: No Stress Concern Present   Feeling of Stress : Not at all  Social Connections: Socially Isolated   Frequency of Communication with Friends and Family: More than three times a week   Frequency of Social Gatherings with Friends and Family: More than three times a week   Attends Religious Services: Never   Marine scientist or Organizations: No   Attends Archivist Meetings: Never   Marital Status: Widowed   Today's Vitals   02/14/21 0955  BP: 132/84  Pulse: 88  Resp: 16  Temp: 97.7 F (36.5 C)  TempSrc: Temporal  SpO2: 99%  Weight: 159 lb 6.4 oz (72.3 kg)  Height: 5\' 8"  (1.727 m)   Body mass index is 24.24 kg/m.  Physical Exam Vitals and nursing note reviewed.  Constitutional:      General: He is not in acute distress.    Appearance: He is well-developed and normal weight.  HENT:     Head: Normocephalic and atraumatic.     Mouth/Throat:     Mouth: Mucous membranes are moist.     Dentition: Has dentures.  Eyes:     Conjunctiva/sclera: Conjunctivae normal.  Cardiovascular:     Rate and Rhythm: Normal rate and regular rhythm.     Heart sounds: No murmur heard. Pulmonary:     Effort: Pulmonary effort is normal. No respiratory distress.     Breath sounds: Normal breath sounds.  Abdominal:     Palpations: Abdomen is soft. There is no hepatomegaly or mass.     Tenderness: There is no abdominal tenderness.  Skin:    General: Skin is warm.     Findings: No erythema or rash.  Neurological:     Mental Status: He is alert and oriented to person, place, and time.     Cranial  Nerves: No cranial nerve deficit.     Comments: Unstable gait, not assisted.  Psychiatric:     Comments: Well groomed, good eye contact.   ASSESSMENT AND PLAN:  [name] was seen today for follow-up.  Diagnoses and all orders for this visit: Orders Placed This Encounter  Procedures   Lipid panel   Chronic low back pain without sciatica, unspecified back pain laterality He is not sure if Duloxetine is helping, for now no changes. Continue Zanaflex 5-10 mg bid prn. Fall precautions discussed.  -     tiZANidine (ZANAFLEX) 2 MG tablet; TAKE ONE TABLET BY MOUTH TWICE A DAY AS NEEDED  Essential hypertension BP adequately controlled. Continue current management: Irbesartan 150 mg daily and Amlodipine 5 mg daily. DASH/low salt diet to continue.  Hx of pulmonary embolus Continue Eliquis 5 mg bid.  Hyperlipemia, mixed Continue Rosuvastatin 20 mg daily and low fat diet. FLP to be added to next blood work.  Aortic atherosclerosis (HCC) Continue Rosuvastatin 20 mg daily.  Recurrent major depressive disorder, in partial remission (HCC) Stable. Continue Duloxetine 30 mg daily.  Return in about 6 months (around 08/15/2021).  Geoge Lawrance [name], MD [facility]. [place] office.

## 2021-02-14 ENCOUNTER — Encounter: Payer: Self-pay | Admitting: Family Medicine

## 2021-02-14 ENCOUNTER — Ambulatory Visit: Payer: Medicare PPO | Admitting: Family Medicine

## 2021-02-14 VITALS — BP 132/84 | HR 88 | Temp 97.7°F | Resp 16 | Ht 68.0 in | Wt 159.4 lb

## 2021-02-14 DIAGNOSIS — F3341 Major depressive disorder, recurrent, in partial remission: Secondary | ICD-10-CM | POA: Diagnosis not present

## 2021-02-14 DIAGNOSIS — E782 Mixed hyperlipidemia: Secondary | ICD-10-CM

## 2021-02-14 DIAGNOSIS — M545 Low back pain, unspecified: Secondary | ICD-10-CM | POA: Diagnosis not present

## 2021-02-14 DIAGNOSIS — G8929 Other chronic pain: Secondary | ICD-10-CM

## 2021-02-14 DIAGNOSIS — I700 Atherosclerosis of aorta: Secondary | ICD-10-CM | POA: Diagnosis not present

## 2021-02-14 DIAGNOSIS — Z86711 Personal history of pulmonary embolism: Secondary | ICD-10-CM

## 2021-02-14 DIAGNOSIS — I10 Essential (primary) hypertension: Secondary | ICD-10-CM

## 2021-02-14 MED ORDER — TIZANIDINE HCL 2 MG PO TABS: ORAL_TABLET | ORAL | 3 refills | Status: DC

## 2021-02-14 NOTE — Patient Instructions (Addendum)
A few things to remember from today's visit:  Essential hypertension  Hx of pulmonary embolus  Hyperlipemia, mixed - Plan: Lipid panel  Aortic atherosclerosis ([place])  If you need refills please call your pharmacy. Do not use My Chart to request refills or for acute issues that need immediate attention.   Check your medications and be sure you are taking Duloxetine, which we used for back pain and anxiety. If you are taking it and not helping, we can start weaning medication off. Continue muscle relaxant daily as needed. Lipid to be added to next blood work. Fall precautions.  Please be sure medication list is accurate. If a new problem present, please set up appointment sooner than planned today.

## 2021-02-16 NOTE — Progress Notes (Deleted)
Cardiology Office Note:    Date:  02/16/2021   ID:  Timothy Wood, DOB 12-03-1940, MRN [id number]  PCP:  [name], [name], MD  Cardiologist:  Donato Heinz, MD  Electrophysiologist:  None   Referring MD: [name], [name], MD   No chief complaint on file.   History of Present Illness:    Timothy Wood is a 80 y.o. male with a hx of lung cancer, adenomatous colon polyps, DVT/PE on Eliquis, COPD, tobacco use, seizures, hypertension, hyperlipidemia, PAD s/p iliac stenting in 2010, and descending aortic aneurysm status post repair in 2012 who presents for follow-up.  He was referred for preop evaluation prior to colonoscopy, initially seen on 02/28/2020.  Smokes 1 pack/day for over 60 years.    Echocardiogram on 04/05/2020 showed normal biventricular function, grade 1 diastolic dysfunction, no significant valvular disease.  Since last clinic visit,   he was hospitalized in [place] from 5/18 through 08/02/2020.  He was found to have acute hypoxic respiratory failure secondary to bilateral pneumonia.  He was treated with broad-spectrum antibiotics, bronchodilators, and steroids.  Since discharge from hospital, he reports that he is doing much better.  Still feels fatigued, but states his dyspnea has resolved.  Denies any chest pain.  Denies any fevers or chills.  No lightheadedness, syncope, or lower extremity edema.  Continues to smoke, but is decreased to 10 to 15 cigarettes/day.  Reports BP has been in 110s when he checks at home.   Past Medical History:  Diagnosis Date   Adenocarcinoma of lung, stage 1 ([place])    Anemia    Angiodysplasia of cecum 11/23/2017   Multiple 1-2 mm   Arthritis    back possibly   Blood clotting disorder (HCC)    Cataract    removed bilaterally    Chronic obstructive pulmonary disease (COPD) ([place])    "patient not aware"   Clotting disorder ([place])    PE LUNG, dvt leg    Colon polyps    Depression    Diverticulosis    DVT (deep venous thrombosis)  ([place])    Post op  knee surgery   DVT (deep venous thrombosis) (HCC)    GERD (gastroesophageal reflux disease)    Histoplasmosis    Histoplasmosis with pneumonia ([place]) 04/01/2015   Hypercholesteremia    Hyperlipidemia    Hypertension    Laceration of head 08/05/2015   Lumbar compression fracture ([place])    Lung cancer ([place]) dx'd 2016   Personal history of colonic polyps 2008   multiple adenomas 2008-2009 exams with large cecal adenoma   Pneumonia    in history   Polyposis coli - attenuated 09/12/2010   2008: adenomas with 9 polyps index and 2.5 cm cecal polyp removed over 3 exams, last 2009 with APC. diverticulosis also. 11/24/2010: 12 polyps removed - adenomas Anticipate routine repeat colonoscopy Spring 2013 Have recommended he see genetics counselor re ? Polyposis 03/27/2016 14 polyps removed max 15 mm    Pulmonary nodule 06/2006   9 mm right upper obe pulmonary nodule (negative Pet Scan)   PVD (peripheral vascular disease) (HCC)    left leg s/p stent   Sebaceous cyst    neck   Seborrheic keratosis    Seizures ([place])    4-5 yrs ago had seizures when in ICU- none since this time    Sinusitis    Tobacco abuse    Tremor     Past Surgical History:  Procedure Laterality Date   ANGIOPLASTY / STENTING  ILIAC  08/2008   Dr. Gray Bernhardt SURGERY  1975, 1978   x2 lumbar fusion over 30 years ago   BACK SURGERY     COLONOSCOPY  05/11/2006; 07/27/2006;05/05/2007, 11/24/2010   adenomas with 9 polyps index and 2.5 cm cecal polyp removed over 3 exams, last 2009 with APC. diverticulosis also.  2012: 12 adenomas removed, largest 8-10 mm, diverticulosis   CORONARY ANGIOPLASTY WITH STENT PLACEMENT     DESCENDING AORTIC ANEURYSM REPAIR W/ STENT  04/2010   Dr. Trula Slade   ESOPHAGOGASTRODUODENOSCOPY  05/05/2007   GERD - one erosion   EYE SURGERY Bilateral    cataract   KNEE SURGERY  2002   left- cartilage    LUNG BIOPSY     PERCUTANEOUS PINNING Left 06/24/2012   Procedure: Repair Complex Laceration Left  Thumb with percutaneous pinning.;  Surgeon: Dennie Bible, MD;  Location: [place];  Service: Plastics;  Laterality: Left;   THUMB AMPUTATION Left    pinning and index finger   TONSILLECTOMY  1956   TONSILLECTOMY     UPPER GASTROINTESTINAL ENDOSCOPY     VIDEO ASSISTED THORACOSCOPY (VATS)/WEDGE RESECTION Right 01/24/2015   Procedure: RIGHT VIDEO ASSISTED THORACOSCOPY (VATS)/WEDGE RESECTION;  Surgeon: [name], MD;  Location: [place];  Service: Thoracic;  Laterality: Right;   vocal cord polyps removed      Current Medications: No outpatient medications have been marked as taking for the 02/17/21 encounter (Appointment) with Donato Heinz, MD.     Allergies:   Codeine and Hydrocodone   Social History   Socioeconomic History   Marital status: Widowed    Spouse name: Not on file   Number of children: 2   Years of education: Not on file   Highest education level: Not on file  Occupational History   Occupation: Retired    Fish farm manager: RETIRED  Tobacco Use   Smoking status: Every Day    Packs/day: 1.00    Years: 60.00    Pack years: 60.00    Types: Cigarettes   Smokeless tobacco: Never  Vaping Use   Vaping Use: Never used  Substance and Sexual Activity   Alcohol use: Yes    Alcohol/week: 0.0 standard drinks    Comment: quit drinking after last hospitalization- may 2017   Drug use: No   Sexual activity: Not Currently  Other Topics Concern   Not on file  Social History Narrative       Married for over 50 years.  Wife died from lung cancer.     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     Occasional alcohol.      Tobacco use - over 50 pack years.   Father died at age 89 of suicide.   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   Social Determinants of Health   Financial Resource Strain: Low Risk    Difficulty of Paying Living Expenses: Not hard at all  Food Insecurity: No Food Insecurity   Worried About Charity fundraiser in  the Last Year: Never true   Arboriculturist in the Last Year: Never true  Transportation Needs: No Transportation Needs   Lack of Transportation (Medical): No   Lack of Transportation (Non-Medical): No  Physical Activity: Inactive   Days of Exercise per Week: 0 days   Minutes of Exercise per Session: 0 min  Stress: No Stress Concern Present   Feeling of Stress : Not at  all  Social Connections: Socially Isolated   Frequency of Communication with Friends and Family: More than three times a week   Frequency of Social Gatherings with Friends and Family: More than three times a week   Attends Religious Services: Never   Marine scientist or Organizations: No   Attends Archivist Meetings: Never   Marital Status: Widowed     Family History: The patient's family history includes Cancer (age of onset: 76) in his mother; Colon cancer (age of onset: 26) in his mother; Deep vein thrombosis in his mother; Diabetes in an other family member; Heart disease in his brother and brother; Lung cancer in his mother and son; Rheum arthritis in his daughter and sister; Uterine cancer in his mother. There is no history of Colon polyps, Stomach cancer, or Rectal cancer.  ROS:   Please see the history of present illness.     All other systems reviewed and are negative.  EKGs/Labs/Other Studies Reviewed:    The following studies were reviewed today:   EKG:  EKG is ordered today.  The ekg ordered today demonstrates normal sinus rhythm, rate 95,  no ST/T abnormalities  TTE 08/06/15: - Left ventricle: The cavity size was normal. Wall thickness was   normal. Systolic function was vigorous. The estimated ejection   fraction was in the range of 65% to 70%. Wall motion was normal;   there were no regional wall motion abnormalities. Doppler   parameters are consistent with abnormal left ventricular   relaxation (grade 1 diastolic dysfunction).  Recent Labs: 03/14/2020: BNP 30.1 08/14/2020:  Magnesium 1.9 09/10/2020: ALT 9; BUN 9; Creatinine 0.95; Hemoglobin 14.9; Platelet Count 172; Potassium 4.3; Sodium 142  Recent Lipid Panel    Component Value Date/Time   CHOL 135 03/14/2020 1407   TRIG 234 (H) 03/14/2020 1407   HDL 38 (L) 03/14/2020 1407   CHOLHDL 3.6 03/14/2020 1407   CHOLHDL 4 10/15/2015 0801   VLDL 22.6 10/15/2015 0801   LDLCALC 59 03/14/2020 1407   LDLDIRECT 118.8 06/14/2006 1144    Physical Exam:    VS:  There were no vitals taken for this visit.    Wt Readings from Last 3 Encounters:  02/14/21 159 lb 6.4 oz (72.3 kg)  09/17/20 163 lb 9.6 oz (74.2 kg)  08/16/20 165 lb (74.8 kg)     GEN: Well nourished, well developed in no acute distress HEENT: Normal NECK: No JVD LYMPHATICS: No lymphadenopathy CARDIAC: RRR, no murmurs, rubs, gallops RESPIRATORY:  Clear to auscultation without rales, wheezing or rhonchi  ABDOMEN: Soft, non-tender, non-distended MUSCULOSKELETAL: No edema; No deformity  SKIN: Warm and dry NEUROLOGIC:  Alert and oriented x 3 PSYCHIATRIC:  Normal affect   ASSESSMENT:    No diagnosis found.  PLAN:     Hypertension: On amlodipine 5 mg daily, irbesartan 150 mg daily  Lower extremity edema: Mild bilateral lower extremity edema.  Echocardiogram shows no structural heart disease.  Normal albumin.  Suspect due to amlodipine use.  Has significantly improved.  Amlodipine dose was decreased to 5 mg daily given soft BP at prior clinic visit  PAD: Status post iliac artery stenting.  Continue apixaban, rosuvastatin.  Normal ABIs on 09/06/2020  H/o DVT/PE: on Eliquis  Hyperlipidemia: Goal LDL less than 70 given PAD.  On rosuvastatin 20 mg daily.  LDL 59 on 03/14/2020  Tobacco use: Patient counseled on the risks of tobacco use and cessation strongly encouraged.  Patient is not interested in quitting smoking at this  time  RTC in***   Medication Adjustments/Labs and Tests Ordered: Current medicines are reviewed at length with the patient  today.  Concerns regarding medicines are outlined above.  No orders of the defined types were placed in this encounter.  No orders of the defined types were placed in this encounter.   There are no Patient Instructions on file for this visit.   Signed, Donato Heinz, MD  02/16/2021 3:04 PM    [facility]

## 2021-02-17 ENCOUNTER — Telehealth: Payer: Self-pay | Admitting: Family Medicine

## 2021-02-17 ENCOUNTER — Ambulatory Visit: Payer: Medicare PPO | Admitting: Cardiology

## 2021-02-17 DIAGNOSIS — Z86711 Personal history of pulmonary embolism: Secondary | ICD-10-CM

## 2021-02-17 MED ORDER — APIXABAN 5 MG PO TABS: 5.0000 mg | ORAL_TABLET | Freq: Two times a day (BID) | ORAL | 3 refills | Status: DC

## 2021-02-17 NOTE — Telephone Encounter (Signed)
Pt is calling and needs a refill on apixaban (ELIQUIS) 5 MG TABS tablet sent to  [facility] [id number] [place], [place] Phone:  [phone]  Fax:  [phone]    Pt no longer uses [facility]

## 2021-03-17 NOTE — Progress Notes (Deleted)
Cardiology Office Note:    Date:  03/17/2021   ID:  CONSTANCE HACKENBERG, DOB [birth year], MRN [id number]  PCP:  [name], [name], MD  Cardiologist:  Donato Heinz, MD  Electrophysiologist:  None   Referring MD: [name], [name], MD   No chief complaint on file.    History of Present Illness:    Timothy Wood is a 81 y.o. male with a hx of lung cancer, adenomatous colon polyps, DVT/PE on Eliquis, COPD, tobacco use, seizures, hypertension, hyperlipidemia, PAD s/p iliac stenting in 2010, and descending aortic aneurysm status post repair in 2012 who presents for follow-up.  He was referred for preop evaluation prior to colonoscopy, initially seen on 02/28/2020.  Smokes 1 pack/day for over 60 years.    Echocardiogram on 04/05/2020 showed normal biventricular function, grade 1 diastolic dysfunction, no significant valvular disease.  Since last clinic visit,   he was hospitalized in [place] from 5/18 through 08/02/2020.  He was found to have acute hypoxic respiratory failure secondary to bilateral pneumonia.  He was treated with broad-spectrum antibiotics, bronchodilators, and steroids.  Since discharge from hospital, he reports that he is doing much better.  Still feels fatigued, but states his dyspnea has resolved.  Denies any chest pain.  Denies any fevers or chills.  No lightheadedness, syncope, or lower extremity edema.  Continues to smoke, but is decreased to 10 to 15 cigarettes/day.  Reports BP has been in 110s when he checks at home.   Past Medical History:  Diagnosis Date   Adenocarcinoma of lung, stage 1 ([place])    Anemia    Angiodysplasia of cecum 11/23/2017   Multiple 1-2 mm   Arthritis    back possibly   Blood clotting disorder (HCC)    Cataract    removed bilaterally    Chronic obstructive pulmonary disease (COPD) ([place])    "patient not aware"   Clotting disorder ([place])    PE LUNG, dvt leg    Colon polyps    Depression    Diverticulosis    DVT (deep venous  thrombosis) ([place])    Post op  knee surgery   DVT (deep venous thrombosis) (HCC)    GERD (gastroesophageal reflux disease)    Histoplasmosis    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 diverticulosis also. 11/24/2010: 12 polyps removed - adenomas Anticipate routine repeat colonoscopy Spring 2013 Have recommended he see genetics counselor re ? Polyposis 03/27/2016 14 polyps removed max 15 mm    Pulmonary nodule 06/2006   9 mm right upper obe pulmonary nodule (negative Pet Scan)   PVD (peripheral vascular disease) (HCC)    left leg s/p stent   Sebaceous cyst    neck   Seborrheic keratosis    Seizures ([place])    4-5 yrs ago had seizures when in ICU- none since this time    Sinusitis    Tobacco abuse    Tremor     Past Surgical History:  Procedure Laterality Date   ANGIOPLASTY /  STENTING ILIAC  08/2008   Dr. Gray Bernhardt SURGERY  1975, 1978   x2 lumbar fusion over 30 years ago   BACK SURGERY     COLONOSCOPY  05/11/2006; 07/27/2006;05/05/2007, 11/24/2010   adenomas with 9 polyps index and 2.5 cm cecal polyp removed over 3 exams, last 2009 with APC. diverticulosis also.  2012: 12 adenomas removed, largest 8-10 mm, diverticulosis   CORONARY ANGIOPLASTY WITH STENT PLACEMENT     DESCENDING AORTIC ANEURYSM REPAIR W/ STENT  04/2010   Dr. Trula Slade   ESOPHAGOGASTRODUODENOSCOPY  05/05/2007   GERD - one erosion   EYE SURGERY Bilateral    cataract   KNEE SURGERY  2002   left- cartilage    LUNG BIOPSY     PERCUTANEOUS PINNING Left 06/24/2012   Procedure: Repair Complex  Laceration Left Thumb with percutaneous pinning.;  Surgeon: Dennie Bible, MD;  Location: [facility];  Service: Plastics;  Laterality: Left;   THUMB AMPUTATION Left    pinning and index finger   TONSILLECTOMY  1956   TONSILLECTOMY     UPPER GASTROINTESTINAL ENDOSCOPY     VIDEO ASSISTED THORACOSCOPY (VATS)/WEDGE RESECTION Right 01/24/2015   Procedure: RIGHT VIDEO ASSISTED THORACOSCOPY (VATS)/WEDGE RESECTION;  Surgeon: [name], MD;  Location: [facility];  Service: Thoracic;  Laterality: Right;   vocal cord polyps removed      Current Medications: No outpatient medications have been marked as taking for the 03/19/21 encounter (Appointment) with Donato Heinz, MD.     Allergies:   Codeine and Hydrocodone   Social History   Socioeconomic History   Marital status: Widowed    Spouse name: Not on file   Number of children: 2   Years of education: Not on file   Highest education level: Not on file  Occupational History   Occupation: Retired    Fish farm manager: RETIRED  Tobacco Use   Smoking status: Every Day    Packs/day: 1.00    Years: 60.00    Pack years: 60.00    Types: Cigarettes   Smokeless tobacco: Never  Vaping Use   Vaping Use: Never used  Substance and Sexual Activity   Alcohol use: Yes    Alcohol/week: 0.0 standard drinks    Comment: quit drinking after last hospitalization- may 2017   Drug use: No   Sexual activity: Not Currently  Other Topics Concern   Not on file  Social History Narrative       Married for over 50 years.  Wife died from lung cancer.     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     Occasional alcohol.      Tobacco use - over 50 pack years.   Father died at age 28 of suicide.   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   Social Determinants of Health   Financial Resource Strain: Low Risk    Difficulty of Paying Living Expenses: Not hard at all  Food Insecurity: No Food Insecurity   Worried About Paediatric nurse in the Last Year: Never true   Arboriculturist in the Last Year: Never true  Transportation Needs: No Transportation Needs   Lack of Transportation (Medical): No   Lack of Transportation (Non-Medical): No  Physical Activity: Inactive   Days of Exercise per Week: 0 days   Minutes of Exercise per Session: 0 min  Stress: No Stress Concern Present   Feeling of Stress : Not  at all  Social Connections: Socially Isolated   Frequency of Communication with Friends and Family: More than three times a week   Frequency of Social Gatherings with Friends and Family: More than three times a week   Attends Religious Services: Never   Marine scientist or Organizations: No   Attends Archivist Meetings: Never   Marital Status: Widowed     Family History: The patient's family history includes Cancer (age of onset: 97) in his mother; Colon cancer (age of onset: 17) in his mother; Deep vein thrombosis in his mother; Diabetes in an other family member; Heart disease in his brother and brother; Lung cancer in his mother and son; Rheum arthritis in his daughter and sister; Uterine cancer in his mother. There is no history of Colon polyps, Stomach cancer, or Rectal cancer.  ROS:   Please see the history of present illness.     All other systems reviewed and are negative.  EKGs/Labs/Other Studies Reviewed:    The following studies were reviewed today:   EKG:  EKG is ordered today.  The ekg ordered today demonstrates normal sinus rhythm, rate 95,  no ST/T abnormalities  TTE 08/06/15: - Left ventricle: The cavity size was normal. Wall thickness was   normal. Systolic function was vigorous. The estimated ejection   fraction was in the range of 65% to 70%. Wall motion was normal;   there were no regional wall motion abnormalities. Doppler   parameters are consistent with abnormal left ventricular   relaxation (grade 1 diastolic dysfunction).  Recent Labs: 08/14/2020: Magnesium  1.9 09/10/2020: ALT 9; BUN 9; Creatinine 0.95; Hemoglobin 14.9; Platelet Count 172; Potassium 4.3; Sodium 142  Recent Lipid Panel    Component Value Date/Time   CHOL 135 03/14/2020 1407   TRIG 234 (H) 03/14/2020 1407   HDL 38 (L) 03/14/2020 1407   CHOLHDL 3.6 03/14/2020 1407   CHOLHDL 4 10/15/2015 0801   VLDL 22.6 10/15/2015 0801   LDLCALC 59 03/14/2020 1407   LDLDIRECT 118.8 06/14/2006 1144    Physical Exam:    VS:  There were no vitals taken for this visit.    Wt Readings from Last 3 Encounters:  02/14/21 159 lb 6.4 oz (72.3 kg)  09/17/20 163 lb 9.6 oz (74.2 kg)  08/16/20 165 lb (74.8 kg)     GEN: Well nourished, well developed in no acute distress HEENT: Normal NECK: No JVD LYMPHATICS: No lymphadenopathy CARDIAC: RRR, no murmurs, rubs, gallops RESPIRATORY:  Clear to auscultation without rales, wheezing or rhonchi  ABDOMEN: Soft, non-tender, non-distended MUSCULOSKELETAL: No edema; No deformity  SKIN: Warm and dry NEUROLOGIC:  Alert and oriented x 3 PSYCHIATRIC:  Normal affect   ASSESSMENT:    No diagnosis found.  PLAN:    Hypertension: On amlodipine 5 mg daily, irbesartan 150 mg daily  Lower extremity edema: Mild bilateral lower extremity edema.  Echocardiogram shows no structural heart disease.  Normal albumin.  Suspect due to amlodipine use.  Has significantly improved.  Amlodipine dose was decreased to 5 mg daily given soft BP at prior clinic visit  PAD: Status post iliac artery stenting.  Continue apixaban, rosuvastatin.  Normal ABIs on 09/06/2020  H/o DVT/PE: on Eliquis  Hyperlipidemia: Goal LDL less than 70 given PAD.  On rosuvastatin 20 mg daily.  LDL 59 on 03/14/2020  Tobacco use: Patient counseled on the risks of tobacco use and cessation strongly encouraged.  Patient is not interested in quitting smoking at this time  RTC  in***   Medication Adjustments/Labs and Tests Ordered: Current medicines are reviewed at length with the patient today.   Concerns regarding medicines are outlined above.  No orders of the defined types were placed in this encounter.   No orders of the defined types were placed in this encounter.    There are no Patient Instructions on file for this visit.   Signed, Donato Heinz, MD  03/17/2021 1:11 PM    [place]

## 2021-03-18 ENCOUNTER — Inpatient Hospital Stay: Payer: Medicare HMO | Attending: Internal Medicine

## 2021-03-18 ENCOUNTER — Ambulatory Visit (HOSPITAL_COMMUNITY)
Admission: RE | Admit: 2021-03-18 | Discharge: 2021-03-18 | Disposition: A | Discharge status: Home / Self Care | Payer: Medicare HMO | Source: Ambulatory Visit | Attending: Internal Medicine | Admitting: Internal Medicine

## 2021-03-18 ENCOUNTER — Encounter (HOSPITAL_COMMUNITY): Payer: Self-pay

## 2021-03-18 ENCOUNTER — Other Ambulatory Visit: Payer: Self-pay

## 2021-03-18 DIAGNOSIS — C3490 Malignant neoplasm of unspecified part of unspecified bronchus or lung: Secondary | ICD-10-CM | POA: Insufficient documentation

## 2021-03-18 DIAGNOSIS — J439 Emphysema, unspecified: Secondary | ICD-10-CM | POA: Insufficient documentation

## 2021-03-18 DIAGNOSIS — Z79899 Other long term (current) drug therapy: Secondary | ICD-10-CM | POA: Insufficient documentation

## 2021-03-18 DIAGNOSIS — Z7901 Long term (current) use of anticoagulants: Secondary | ICD-10-CM | POA: Insufficient documentation

## 2021-03-18 DIAGNOSIS — C3411 Malignant neoplasm of upper lobe, right bronchus or lung: Secondary | ICD-10-CM | POA: Insufficient documentation

## 2021-03-18 DIAGNOSIS — Z885 Allergy status to narcotic agent status: Secondary | ICD-10-CM | POA: Insufficient documentation

## 2021-03-18 DIAGNOSIS — Z8719 Personal history of other diseases of the digestive system: Secondary | ICD-10-CM | POA: Insufficient documentation

## 2021-03-18 DIAGNOSIS — I10 Essential (primary) hypertension: Secondary | ICD-10-CM | POA: Insufficient documentation

## 2021-03-18 DIAGNOSIS — Z86010 Personal history of colonic polyps: Secondary | ICD-10-CM | POA: Insufficient documentation

## 2021-03-18 DIAGNOSIS — Z86718 Personal history of other venous thrombosis and embolism: Secondary | ICD-10-CM | POA: Insufficient documentation

## 2021-03-18 DIAGNOSIS — I700 Atherosclerosis of aorta: Secondary | ICD-10-CM | POA: Insufficient documentation

## 2021-03-18 LAB — CMP (CANCER CENTER ONLY)
ALT: 11 U/L (ref 0–44)
AST: 13 U/L — ABNORMAL LOW (ref 15–41)
Albumin: 4.3 g/dL (ref 3.5–5.0)
Alkaline Phosphatase: 84 U/L (ref 38–126)
Anion gap: 10 (ref 5–15)
BUN: 13 mg/dL (ref 8–23)
CO2: 24 mmol/L (ref 22–32)
Calcium: 9 mg/dL (ref 8.9–10.3)
Chloride: 104 mmol/L (ref 98–111)
Creatinine: 1.05 mg/dL (ref 0.61–1.24)
GFR, Estimated: >60 mL/min (ref >60)
Glucose, Bld: 111 mg/dL — ABNORMAL HIGH (ref 70–99)
Potassium: 3.6 mmol/L (ref 3.5–5.1)
Sodium: 138 mmol/L (ref 135–145)
Total Bilirubin: 0.8 mg/dL (ref 0.3–1.2)
Total Protein: 7 g/dL (ref 6.5–8.1)

## 2021-03-18 LAB — CBC WITH DIFFERENTIAL (CANCER CENTER ONLY)
Abs Immature Granulocytes: 0.03 10*3/uL (ref 0.00–0.07)
Basophils Absolute: 0.1 10*3/uL (ref 0.0–0.1)
Basophils Relative: 1 %
Eosinophils Absolute: 0.2 10*3/uL (ref 0.0–0.5)
Eosinophils Relative: 2 %
HCT: 46 % (ref 39.0–52.0)
Hemoglobin: 15.8 g/dL (ref 13.0–17.0)
Immature Granulocytes: 0 %
Lymphocytes Relative: 21 %
Lymphs Abs: 1.7 10*3/uL (ref 0.7–4.0)
MCH: 31.5 pg (ref 26.0–34.0)
MCHC: 34.3 g/dL (ref 30.0–36.0)
MCV: 91.6 fL (ref 80.0–100.0)
Monocytes Absolute: 0.8 10*3/uL (ref 0.1–1.0)
Monocytes Relative: 10 %
Neutro Abs: 5.6 10*3/uL (ref 1.7–7.7)
Neutrophils Relative %: 66 %
Platelet Count: 176 10*3/uL (ref 150–400)
RBC: 5.02 MIL/uL (ref 4.22–5.81)
RDW: 13.2 % (ref 11.5–15.5)
WBC Count: 8.3 10*3/uL (ref 4.0–10.5)
nRBC: 0 % (ref 0.0–0.2)

## 2021-03-18 MED ORDER — SODIUM CHLORIDE (PF) 0.9 % IJ SOLN
INTRAMUSCULAR | Status: AC
  Filled 2021-03-18: qty 50

## 2021-03-18 MED ORDER — IOHEXOL 350 MG/ML SOLN
60.0000 mL | Freq: Once | INTRAVENOUS | Status: AC | PRN
  Administered 2021-03-18: 60 mL via INTRAVENOUS

## 2021-03-19 ENCOUNTER — Ambulatory Visit: Payer: Medicare PPO | Admitting: Cardiology

## 2021-03-24 ENCOUNTER — Encounter: Payer: Self-pay | Admitting: Internal Medicine

## 2021-03-24 ENCOUNTER — Other Ambulatory Visit: Payer: Self-pay

## 2021-03-24 ENCOUNTER — Inpatient Hospital Stay: Payer: Medicare HMO | Admitting: Internal Medicine

## 2021-03-24 VITALS — BP 168/71 | HR 85 | Temp 98.6°F | Resp 18 | Ht 68.0 in | Wt 168.4 lb

## 2021-03-24 DIAGNOSIS — Z8719 Personal history of other diseases of the digestive system: Secondary | ICD-10-CM | POA: Diagnosis not present

## 2021-03-24 DIAGNOSIS — Z79899 Other long term (current) drug therapy: Secondary | ICD-10-CM | POA: Diagnosis not present

## 2021-03-24 DIAGNOSIS — C3411 Malignant neoplasm of upper lobe, right bronchus or lung: Secondary | ICD-10-CM | POA: Diagnosis not present

## 2021-03-24 DIAGNOSIS — C3412 Malignant neoplasm of upper lobe, left bronchus or lung: Secondary | ICD-10-CM | POA: Diagnosis not present

## 2021-03-24 DIAGNOSIS — Z86718 Personal history of other venous thrombosis and embolism: Secondary | ICD-10-CM | POA: Diagnosis not present

## 2021-03-24 DIAGNOSIS — I10 Essential (primary) hypertension: Secondary | ICD-10-CM | POA: Diagnosis not present

## 2021-03-24 DIAGNOSIS — C3490 Malignant neoplasm of unspecified part of unspecified bronchus or lung: Secondary | ICD-10-CM | POA: Diagnosis not present

## 2021-03-24 DIAGNOSIS — Z86010 Personal history of colonic polyps: Secondary | ICD-10-CM | POA: Diagnosis not present

## 2021-03-24 DIAGNOSIS — Z885 Allergy status to narcotic agent status: Secondary | ICD-10-CM | POA: Diagnosis not present

## 2021-03-24 DIAGNOSIS — I700 Atherosclerosis of aorta: Secondary | ICD-10-CM | POA: Diagnosis not present

## 2021-03-24 DIAGNOSIS — J439 Emphysema, unspecified: Secondary | ICD-10-CM | POA: Diagnosis not present

## 2021-03-24 DIAGNOSIS — Z7901 Long term (current) use of anticoagulants: Secondary | ICD-10-CM | POA: Diagnosis not present

## 2021-03-24 NOTE — Progress Notes (Signed)
[facility] Telephone:([phone]) [phone]   Fax:([phone]  OFFICE PROGRESS NOTE  [name], [name], MD [place] [address]  DIAGNOSIS: Stage IA (T1a, N0, M0) non-small cell lung cancer, adenocarcinoma  diagnosed in April 2016.  PRIOR THERAPY: Status post right VATS with wedge resection of the right middle lobe as well as wedge resection of the right upper lobe and pleural biopsy under the care of [name] on 01/24/2015.  CURRENT THERAPY: Observation.  INTERVAL HISTORY: Timothy Wood 81 y.o. male returns to the clinic today for 76-month follow-up visit.  The patient is feeling fine today with no concerning complaints except for mild cough.  He denied having any chest pain, shortness of breath or hemoptysis.  He has no nausea, vomiting, diarrhea or constipation.  He has no headache or visual changes.  He denied having any significant weight loss or night sweats.  He is here today for evaluation with repeat CT scan of the chest for restaging of his disease.  MEDICAL HISTORY: Past Medical History:  Diagnosis Date   Adenocarcinoma of lung, stage 1 ([place])    Anemia    Angiodysplasia of cecum 11/23/2017   Multiple 1-2 mm   Arthritis    back possibly   Blood clotting disorder (HCC)    Cataract    removed bilaterally    Chronic obstructive pulmonary disease (COPD) ([place])    "patient not aware"   Clotting disorder ([place])    PE LUNG, dvt leg    Colon polyps    Depression    Diverticulosis    DVT (deep venous thrombosis) ([place])    Post op  knee surgery   DVT (deep venous thrombosis) (HCC)    GERD (gastroesophageal reflux disease)    Histoplasmosis    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 diverticulosis also. 11/24/2010: 12 polyps removed - adenomas Anticipate routine repeat colonoscopy Spring 2013 Have recommended he see genetics counselor re ? Polyposis 03/27/2016 14 polyps removed max 15 mm    Pulmonary nodule 06/2006   9 mm right upper obe pulmonary nodule (negative Pet Scan)   PVD (peripheral vascular disease) (HCC)    left leg s/p stent   Sebaceous cyst    neck   Seborrheic keratosis    Seizures ([place])    4-5 yrs ago had seizures when in ICU- none since this time    Sinusitis    Tobacco abuse    Tremor     ALLERGIES:  is allergic to codeine and hydrocodone.  MEDICATIONS:  Current Outpatient Medications  Medication Sig Dispense Refill   amLODipine (NORVASC) 5 MG tablet Take 1 tablet (5 mg total) by mouth daily. 90 tablet 3   apixaban (ELIQUIS) 5 MG TABS tablet Take 1 tablet (5 mg total) by mouth 2 (two) times daily. 180 tablet 3   DULoxetine (CYMBALTA) 30 MG capsule Take 1 capsule (30 mg total) by mouth daily. 90 capsule 2   EPINEPHrine (EPIPEN 2-PAK)  0.3 mg/0.3 mL IJ SOAJ injection To use daily if needed for severe allergy reaction. 1 each 0   irbesartan (AVAPRO) 150 MG tablet Take 1 tablet (150 mg total) by mouth daily. 90 tablet 1   omeprazole (PRILOSEC) 20 MG capsule TAKE ONE CAPSULE BY MOUTH TWICE A DAY BEFORE MEALS 180 capsule 1   rosuvastatin (CRESTOR) 20 MG tablet TAKE ONE TABLET BY MOUTH DAILY 90 tablet 1   tiZANidine (ZANAFLEX) 2 MG tablet TAKE ONE TABLET BY MOUTH TWICE A DAY AS NEEDED 60 tablet 3   No current facility-administered medications for this visit.    SURGICAL HISTORY:  Past Surgical History:  Procedure Laterality Date   ANGIOPLASTY / STENTING ILIAC  08/2008   Dr. Gray Bernhardt SURGERY  1975, 1978   x2 lumbar fusion over 30 years ago   BACK SURGERY      COLONOSCOPY  05/11/2006; 07/27/2006;05/05/2007, 11/24/2010   adenomas with 9 polyps index and 2.5 cm cecal polyp removed over 3 exams, last 2009 with APC. diverticulosis also.  2012: 12 adenomas removed, largest 8-10 mm, diverticulosis   CORONARY ANGIOPLASTY WITH STENT PLACEMENT     DESCENDING AORTIC ANEURYSM REPAIR W/ STENT  04/2010   Dr. Trula Slade   ESOPHAGOGASTRODUODENOSCOPY  05/05/2007   GERD - one erosion   EYE SURGERY Bilateral    cataract   KNEE SURGERY  2002   left- cartilage    LUNG BIOPSY     PERCUTANEOUS PINNING Left 06/24/2012   Procedure: Repair Complex Laceration Left Thumb with percutaneous pinning.;  Surgeon: Dennie Bible, MD;  Location: [facility];  Service: Plastics;  Laterality: Left;   THUMB AMPUTATION Left    pinning and index finger   TONSILLECTOMY  1956   TONSILLECTOMY     UPPER GASTROINTESTINAL ENDOSCOPY     VIDEO ASSISTED THORACOSCOPY (VATS)/WEDGE RESECTION Right 01/24/2015   Procedure: RIGHT VIDEO ASSISTED THORACOSCOPY (VATS)/WEDGE RESECTION;  Surgeon: [name], MD;  Location: [facility];  Service: Thoracic;  Laterality: Right;   vocal cord polyps removed      REVIEW OF SYSTEMS:  A comprehensive review of systems was negative except for: Respiratory: positive for cough   PHYSICAL EXAMINATION: General appearance: alert, cooperative, and no distress Head: Normocephalic, without obvious abnormality, atraumatic Neck: no adenopathy, no JVD, supple, symmetrical, trachea midline, and thyroid not enlarged, symmetric, no tenderness/mass/nodules Lymph nodes: Cervical, supraclavicular, and axillary nodes normal. Resp: clear to auscultation bilaterally Back: symmetric, no curvature. ROM normal. No CVA tenderness. Cardio: regular rate and rhythm, S1, S2 normal, no murmur, click, rub or gallop GI: soft, non-tender; bowel sounds normal; no masses,  no organomegaly Extremities: extremities normal, atraumatic, no cyanosis or edema  ECOG PERFORMANCE STATUS: 1 - Symptomatic but  completely ambulatory  Blood pressure (!) 168/71, pulse 85, temperature 98.6 F (37 C), temperature source Temporal, resp. rate 18, height 5\' 8"  (1.727 m), weight 168 lb 6.4 oz (76.4 kg), SpO2 99 %.  LABORATORY DATA: Lab Results  Component Value Date   WBC 8.3 03/18/2021   HGB 15.8 03/18/2021   HCT 46.0 03/18/2021   MCV 91.6 03/18/2021   PLT 176 03/18/2021      Chemistry      Component Value Date/Time   NA 138 03/18/2021 1042   NA 143 03/14/2020 1407   NA 143 10/12/2016 0958   K 3.6 03/18/2021 1042   K 3.7 10/12/2016 0958   CL 104 03/18/2021 1042   CO2 24 03/18/2021 1042   CO2 26 10/12/2016 0958  BUN 13 03/18/2021 1042   BUN 10 03/14/2020 1407   BUN 17.8 10/12/2016 0958   CREATININE 1.05 03/18/2021 1042   CREATININE 1.2 10/12/2016 0958      Component Value Date/Time   CALCIUM 9.0 03/18/2021 1042   CALCIUM 9.2 10/12/2016 0958   ALKPHOS 84 03/18/2021 1042   ALKPHOS 92 10/12/2016 0958   AST 13 (L) 03/18/2021 1042   AST 16 10/12/2016 0958   ALT 11 03/18/2021 1042   ALT 16 10/12/2016 0958   BILITOT 0.8 03/18/2021 1042   BILITOT 0.76 10/12/2016 0958       RADIOGRAPHIC STUDIES: CT Chest W Contrast  Result Date: 03/19/2021 CLINICAL DATA:  History of lung cancer EXAM: CT CHEST WITH CONTRAST TECHNIQUE: Multidetector CT imaging of the chest was performed during intravenous contrast administration. CONTRAST:  22mL OMNIPAQUE IOHEXOL 350 MG/ML SOLN COMPARISON:  CT chest 09/10/2020 FINDINGS: Cardiovascular: Heart size is normal. No pericardial effusion identified. Coronary artery calcifications noted. Main pulmonary artery is normal caliber. Thoracic aorta is tortuous and normal caliber with severe atherosclerotic plaques. Mediastinum/Nodes: No bulky axillary, hilar or mediastinal lymphadenopathy identified. Stable mediastinal and subcarinal lymph nodes. Lungs/Pleura: Mild emphysematous changes of lungs. Stable postsurgical changes in the right lung. Interval resolution of  irregular densities in the posterior right lower lobe and the pleuroparenchymal somewhat nodular density in the medial aspect of the left lower lobe since previous study. Small irregular pleural-parenchymal density in the medial aspect of the left upper lobe measuring up to 5 mm in thickness, new since previous study. Few additional small areas of stable parenchymal scarring bilaterally. No pleural effusion or pneumothorax. Upper Abdomen: No acute process identified. Stable small nodularity of the adrenal glands and multiple hepatic hypodensities. Musculoskeletal: No chest wall abnormality. No acute or significant osseous findings. IMPRESSION: 1. No new suspicious mass or lymphadenopathy identified in the chest. 2. Small pleuroparenchymal irregular density in the medial aspect of the left upper lobe, attention on follow-up. 3. Emphysematous changes of the lungs and postsurgical changes on the right. 4. Other stable and chronic findings as described. Aortic Atherosclerosis (ICD10-I70.0) and Emphysema (ICD10-J43.9). Electronically Signed   By: Ofilia Neas M.D.   On: 03/19/2021 09:04     ASSESSMENT AND PLAN:  This is a very pleasant 81 years old white male with stage IA non-small cell lung cancer diagnosed in April 2016 and has been observation since that time. The patient is doing fine today with no concerning complaints except for the smoking cough.  He denied having any shortness of breath.  He had repeat CT scan of the chest performed recently.  I personally and independently reviewed the scan and discussed the results with the patient today. His scan showed no concerning findings for disease recurrence or metastasis. I recommended for him to continue on observation with repeat CT scan of the chest in 1 year. The patient was strongly advised to quit smoking. He was also advised to call immediately if he has any other concerning symptoms in the interval.  The patient voices understanding of current  disease status and treatment options and is in agreement with the current care plan. All questions were answered. The patient knows to call the clinic with any problems, questions or concerns. We can certainly see the patient much sooner if necessary.   Disclaimer: This note was dictated with voice recognition software. Similar sounding words can inadvertently be transcribed and may not be corrected upon review.

## 2021-03-26 DIAGNOSIS — H52203 Unspecified astigmatism, bilateral: Secondary | ICD-10-CM | POA: Diagnosis not present

## 2021-03-26 DIAGNOSIS — Z961 Presence of intraocular lens: Secondary | ICD-10-CM | POA: Diagnosis not present

## 2021-04-07 ENCOUNTER — Telehealth: Payer: Self-pay | Admitting: Family Medicine

## 2021-04-07 NOTE — Telephone Encounter (Signed)
Left message for patient to call back and schedule Medicare Annual Wellness Visit (AWV) either virtually or in office. Left  my Timothy Wood number [phone]   Last AWV 05/08/20 ; please schedule at anytime with [facility] Nurse Health Advisor 1 or 2  Awv can be schedule calendar year for [employer] ins  This should be a 45 minute visit.

## 2021-04-08 NOTE — Telephone Encounter (Signed)
Patient called to return missed call. I let patient know that it was Shirlean Mylar calling to schedule him for his AWV. Patient states that he feels he does not need a AWV, so appointment was canceled.     FYI

## 2021-05-26 ENCOUNTER — Other Ambulatory Visit: Payer: Self-pay | Admitting: Family Medicine

## 2021-05-26 DIAGNOSIS — E785 Hyperlipidemia, unspecified: Secondary | ICD-10-CM

## 2021-06-05 ENCOUNTER — Telehealth: Payer: Self-pay | Admitting: Family Medicine

## 2021-06-05 NOTE — Telephone Encounter (Signed)
Left message for patient to call back and schedule Medicare Annual Wellness Visit (AWV) either virtually or in office. Left  my Timothy Wood number [phone] ? ? ?Last AWV 05/08/20 ?please schedule at anytime with [facility] Nurse Health Advisor 1 or 2 ? ? ? ?

## 2021-06-20 ENCOUNTER — Other Ambulatory Visit: Payer: Self-pay | Admitting: Family Medicine

## 2021-06-20 DIAGNOSIS — G8929 Other chronic pain: Secondary | ICD-10-CM

## 2021-07-01 ENCOUNTER — Telehealth: Payer: Self-pay | Admitting: Family Medicine

## 2021-07-01 NOTE — Telephone Encounter (Signed)
Left message for patient to call back and schedule Medicare Annual Wellness Visit (AWV) either virtually or in office. Left  my Herbie Drape number [phone] ? ? ?Last AWV ;05/08/20 ? please schedule at anytime with [facility] Nurse Health Advisor 1 or 2 ? ? ? ?

## 2021-07-16 ENCOUNTER — Telehealth: Payer: Self-pay | Admitting: Family Medicine

## 2021-07-16 NOTE — Progress Notes (Signed)
?  Chronic Care Management  ? ?Outreach Note ? ?07/16/2021 ?Name: Timothy Wood MRN: [id number] DOB: 1941-03-01 ? ?Referred by: [name], [name], MD ?Reason for referral : No chief complaint on file. ? ? ?A second unsuccessful telephone outreach was attempted today. The patient was referred to pharmacist for assistance with care management and care coordination. ? ?Follow Up Plan:  ? ?Tatjana Dellinger ?Upstream Scheduler  ?

## 2021-07-21 ENCOUNTER — Telehealth: Payer: Self-pay | Admitting: Family Medicine

## 2021-07-21 NOTE — Progress Notes (Signed)
?  Care Management  ? ?Follow Up Note ? ? ?07/21/2021 ?Name: Timothy Wood MRN: [id number] DOB: April 12, 1940 ? ? ?Referred by: [name], [name], MD ?Reason for referral : No chief complaint on file. ? ? ?Third unsuccessful telephone outreach was attempted today. The patient was referred to the case management team for assistance with care management and care coordination. The patient's primary care provider has been notified of our unsuccessful attempts to make or maintain contact with the patient. The care management team is pleased to engage with this patient at any time in the future should he/she be interested in assistance from the care management team.  ? ?Follow Up Plan:  Left message for patient, will wait for return call  ? ?SIGNATURE  ?Noelle Penner ?Upstream Scheduler  ?

## 2021-07-22 ENCOUNTER — Telehealth: Payer: Self-pay | Admitting: Family Medicine

## 2021-07-22 NOTE — Chronic Care Management (AMB) (Signed)
?  Care Management  ?Note ? ? ?07/22/2021 ?Name: KAHLEEL FADELEY MRN: [id number] DOB: Feb 18, 1941 ? ?RIKU BUTTERY is a 81 y.o. year old male who is a primary care patient of [name], Malka So, MD. The care management team was consulted for assistance with chronic disease management and care coordination needs.  ? ?Mr. Hadden was given information about Care Management services today including:  ?CCM service includes personalized support from designated clinical staff supervised by the physician, including individualized plan of care and coordination with other care providers ?24/7 contact phone numbers for assistance for urgent and routine care needs. ?Service will only be billed when office clinical staff spend 20 minutes or more in a month to coordinate care. ?Only one practitioner may furnish and bill the service in a calendar month. ?The patient may stop CCM services at amy time (effective at the end of the month) by phone call to the office staff. ?The patient will be responsible for cost sharing (co-pay) or up to 20% of the service fee (after annual deductible is met) ? ?Patient agreed to services and verbal consent obtained. ? ?Follow up plan:   ?Face to Face appointment with care management team member scheduled for: 08/20/21 $RemoveBef'@9am'[id number]$  ? ?Noelle Penner ?Upstream Scheduler  ?

## 2021-07-28 ENCOUNTER — Telehealth: Payer: Self-pay | Admitting: Family Medicine

## 2021-07-28 NOTE — Telephone Encounter (Signed)
Tried calling patient to schedule Medicare Annual Wellness Visit (AWV) either virtually or in office.  ? ?No answer ? ? ?Last AWV 05/08/20 ?; please schedule at anytime with [facility] Nurse Health Advisor 1 or 2 ? ? ?

## 2021-08-18 ENCOUNTER — Telehealth: Payer: Self-pay | Admitting: Pharmacist

## 2021-08-18 ENCOUNTER — Telehealth: Payer: Self-pay

## 2021-08-18 DIAGNOSIS — I10 Essential (primary) hypertension: Secondary | ICD-10-CM

## 2021-08-18 NOTE — Telephone Encounter (Signed)
-----   Message from Viona Gilmore, [facility] sent at 08/18/2021  1:03 PM EDT ----- Regarding: CCM referral Hi,  Can you please place a CCM referral for Mr. Spark?  Thanks, [name]

## 2021-08-18 NOTE — Chronic Care Management (AMB) (Signed)
Chronic Care Management Pharmacy Assistant   Name: LIZZIE COKLEY  MRN: [id number] DOB: 02-21-1941  Reason for Encounter: Chart prep for initial encounter with Jeni Salles Clinical Pharmacist on 08/20/21 at 9 am in Office.   Conditions to be addressed/monitored: CAD, HTN, and HLD  Recent office visits:  02/14/21 [name], [name], MD - Patient presented for Chronic low back pain without sciatica unspecified back pain laterality and other concerns. No medication changes.  Recent consult visits:  03/26/21 Katy Apo ( Ophthalmology) - Claims encounter for presence of intraocular lens and unspecified astigmatism bilateral. No other visit details available.  03/24/21 Curt Bears, MD (Oncology) - Patient presented for Malignant neoplasm of unspecified part of unspecified bronchus or lung and other concerns. No medication changes.  03/18/21 Patient presented to [facility] for [facility] visits:  None in previous 6 months  Medications: Outpatient Encounter Medications as of 08/18/2021  Medication Sig   amLODipine (NORVASC) 5 MG tablet Take 1 tablet (5 mg total) by mouth daily.   apixaban (ELIQUIS) 5 MG TABS tablet Take 1 tablet (5 mg total) by mouth 2 (two) times daily.   DULoxetine (CYMBALTA) 30 MG capsule TAKE ONE CAPSULE BY MOUTH DAILY   EPINEPHrine (EPIPEN 2-PAK) 0.3 mg/0.3 mL IJ SOAJ injection To use daily if needed for severe allergy reaction.   irbesartan (AVAPRO) 150 MG tablet Take 1 tablet (150 mg total) by mouth daily.   omeprazole (PRILOSEC) 20 MG capsule TAKE ONE CAPSULE BY MOUTH TWICE A DAY BEFORE MEALS   rosuvastatin (CRESTOR) 20 MG tablet TAKE ONE TABLET BY MOUTH DAILY   tiZANidine (ZANAFLEX) 2 MG tablet TAKE ONE TABLET BY MOUTH TWICE A DAY AS NEEDED   No facility-administered encounter medications on file as of 08/18/2021.  Fill History  :  amlodipine 5 mg tablet 05/26/2021 90   Eliquis 5 mg tablet 06/03/2021 90    duloxetine 30 mg capsule,delayed release 06/23/2021 90   EPINEPHRINE  INJ 0.3MG  05/26/2019 30   irbesartan 150 mg tablet 04/23/2021 90   omeprazole 20 mg capsule,delayed release 12/10/2020 90   rosuvastatin 20 mg tablet 04/25/2021 90   Have you seen any other providers since your last visit? Patient reports none  Any changes in your medications or health? Patient reports no  Any side effects from any medications? Patient reports he notices some dizziness and balance issues  Do you have an symptoms or problems not managed by your medications? Patient reports he thinks his medications are working well.  Any concerns about your health right now? Patient reports none  Has your provider asked that you check blood pressure, blood sugar, or follow special diet at home? Patient reports he has an arm cuff to monitor blood pressure and is checking regularly but has been running high. He offered readings of 165/88, 168/90, 153/80, 158/87, 168/90, 142/80  Do you have any problems getting your medications? Patient reports he is happy with his current pharmacy, he reports that he does have difficulty in affording his Eliquis, Patient assistance form pre filled and uploaded to have available during appointment.   Is there anything that you would like to discuss during the appointment? Patient reports none  Patient aware to bring blood pressure machine and medications and supplements to appointment   Care Gaps: AWV- Patient declined per chart notes 1/23 COVID Booster - Overdue Colonoscopy - Overdue Zoster vaccine - Postponed BP- 168/71 (03/24/21)  Star Rating Drugs: Irbesartan 150 mg -  Last filled 04/23/21 90 DS at [facility] Rosuvastatin 20 mg - Last filled 04/25/21 90 DS at [place] Pharmacist Assistant [phone]

## 2021-08-19 NOTE — Progress Notes (Signed)
Chronic Care Management Pharmacy Note  08/20/2021 Name:  CALLIN ASHE MRN:  [id number] DOB:  02-13-1941  Summary: BP recently significantly fluctuating Pt reports dizziness often and stopped taking evening medications  Recommendations/Changes made from today's visit: -Recommend switching Eliquis to Xarelto to improve compliance -Recommended trial of timed release melatonin 2 or 3 mg dose for sleep -Recommended taking tizanidine as needed rather than scheduled to get more benefit for when he needs relief -Recommended purchasing new arm BP cuff -Requested Epipen refill as current supply is expired  Plan: Apply for PAP for Xarelto or Eliquis pending change Scheduled CPE for 2 weeks BP assessment in 1-2 months   Subjective: JUANCARLOS CRESCENZO is an 81 y.o. year old male who is a primary patient of [name], Malka So, MD.  The CCM team was consulted for assistance with disease management and care coordination needs.    Engaged with patient face to face for initial visit in response to provider referral for pharmacy case management and/or care coordination services.   Consent to Services:  The patient was given the following information about Chronic Care Management services today, agreed to services, and gave verbal consent: 1. CCM service includes personalized support from designated clinical staff supervised by the primary care provider, including individualized plan of care and coordination with other care providers 2. 24/7 contact phone numbers for assistance for urgent and routine care needs. 3. Service will only be billed when office clinical staff spend 20 minutes or more in a month to coordinate care. 4. Only one practitioner may furnish and bill the service in a calendar month. 5.The patient may stop CCM services at any time (effective at the end of the month) by phone call to the office staff. 6. The patient will be responsible for cost sharing (co-pay) of up to 20% of the service  fee (after annual deductible is met). Patient agreed to services and consent obtained.  Patient Care Team: [name], [name], MD as PCP - General (Family Medicine) Donato Heinz, MD as PCP - Cardiology (Cardiology) Gatha Mayer, MD as Consulting Physician (Gastroenterology) Shawna Orleans, Doe-Hyun R, DO (Inactive) Viona Gilmore, [facility] as Pharmacist (Pharmacist)  Recent office visits: 02/14/21 [name], [name], MD - Patient presented for Chronic low back pain without sciatica unspecified back pain laterality and other concerns. No medication changes.  Recent consult visits: 03/26/21 Katy Apo ( Ophthalmology) - Claims encounter for presence of intraocular lens and unspecified astigmatism bilateral. No other visit details available.   03/24/21 Curt Bears, MD (Oncology) - Patient presented for Malignant neoplasm of unspecified part of unspecified bronchus or lung and other concerns. No medication changes.   03/18/21 Patient presented to [facility] for [facility] visits: None in previous 6 months   Objective:  Lab Results  Component Value Date   CREATININE 1.05 03/18/2021   BUN 13 03/18/2021   GFR 62.52 08/14/2020   EGFR 56 (L) 10/12/2016   GFRNONAA >60 03/18/2021   GFRAA 87 03/14/2020   NA 138 03/18/2021   K 3.6 03/18/2021   CALCIUM 9.0 03/18/2021   CO2 24 03/18/2021   GLUCOSE 111 (H) 03/18/2021    Lab Results  Component Value Date/Time   HGBA1C 5.7 01/26/2013 08:12 AM   GFR 62.52 08/14/2020 12:09 PM   GFR 66.28 10/01/2017 09:17 AM    Last diabetic Eye exam: No results found for: HMDIABEYEEXA  Last diabetic Foot exam: No results found for: HMDIABFOOTEX  Lab Results  Component Value Date   CHOL 135 03/14/2020   HDL 38 (L) 03/14/2020   LDLCALC 59 03/14/2020   LDLDIRECT 118.8 06/14/2006   TRIG 234 (H) 03/14/2020   CHOLHDL 3.6 03/14/2020       Latest Ref Rng & Units 03/18/2021   10:42 AM 09/10/2020   11:03 AM  08/14/2020   12:09 PM  Hepatic Function  Total Protein 6.5 - 8.1 g/dL 7.0   6.9   7.1    Albumin 3.5 - 5.0 g/dL 4.3   3.7   4.4    AST 15 - 41 U/L 13   14   22     ALT 0 - 44 U/L 11   9   27     Alk Phosphatase 38 - 126 U/L 84   77   93    Total Bilirubin 0.3 - 1.2 mg/dL 0.8   0.8   0.3    Bilirubin, Direct 0.0 - 0.3 mg/dL   0.1      Lab Results  Component Value Date/Time   TSH 1.77 10/15/2015 08:01 AM   TSH 2.42 06/29/2014 01:43 PM       Latest Ref Rng & Units 03/18/2021   10:42 AM 09/10/2020   11:03 AM 08/14/2020   12:09 PM  CBC  WBC 4.0 - 10.5 K/uL 8.3   5.5   7.5    Hemoglobin 13.0 - 17.0 g/dL 15.8   14.9   16.1    Hematocrit 39.0 - 52.0 % 46.0   45.0   48.2    Platelets 150 - 400 K/uL 176   172   333.0      No results found for: VD25OH  Clinical ASCVD: Yes  The ASCVD Risk score (Arnett DK, et al., 2019) failed to calculate for the following reasons:   The 2019 ASCVD risk score is only valid for ages 79 to 21       02/14/2021    9:59 AM 05/08/2020    2:30 PM 10/16/2019   10:51 AM  Depression screen PHQ 2/9  Decreased Interest 3 2 3   Down, Depressed, Hopeless 1 0 1  PHQ - 2 Score 4 2 4   Altered sleeping 3 3 3   Tired, decreased energy 3 1 1   Change in appetite 0 0 0  Feeling bad or failure about yourself  1 0 1  Trouble concentrating 1 0 0  Moving slowly or fidgety/restless 0 0 0  Suicidal thoughts 0 0 0  PHQ-9 Score 12 6 9   Difficult doing work/chores  Somewhat difficult Somewhat difficult       Social History   Tobacco Use  Smoking Status Every Day   Packs/day: 1.00   Years: 60.00   Pack years: 60.00   Types: Cigarettes  Smokeless Tobacco Never   BP Readings from Last 3 Encounters:  08/20/21 136/62  03/24/21 (!) 168/71  02/14/21 132/84   Pulse Readings from Last 3 Encounters:  03/24/21 85  02/14/21 88  09/17/20 96   Wt Readings from Last 3 Encounters:  03/24/21 168 lb 6.4 oz (76.4 kg)  02/14/21 159 lb 6.4 oz (72.3 kg)  09/17/20 163 lb 9.6 oz  (74.2 kg)   BMI Readings from Last 3 Encounters:  03/24/21 25.61 kg/m  02/14/21 24.24 kg/m  09/17/20 24.88 kg/m    Assessment/Interventions: Review of patient past medical history, allergies, medications, health status, including review of consultants reports, laboratory and other test data, was performed as part of comprehensive evaluation  and provision of chronic care management services.   SDOH:  (Social Determinants of Health) assessments and interventions performed: Yes SDOH Interventions    Flowsheet Row Most Recent Value  SDOH Interventions   Financial Strain Interventions Intervention Not Indicated  Transportation Interventions Intervention Not Indicated      SDOH Screenings   Alcohol Screen: Not on file  Depression (PHQ2-9): Medium Risk   PHQ-2 Score: 12  Financial Resource Strain: Low Risk    Difficulty of Paying Living Expenses: Not very hard  Food Insecurity: Not on file  Housing: Not on file  Physical Activity: Not on file  Social Connections: Not on file  Stress: Not on file  Tobacco Use: High Risk   Smoking Tobacco Use: Every Day   Smokeless Tobacco Use: Never   Passive Exposure: Not on file  Transportation Needs: No Transportation Needs   Lack of Transportation (Medical): No   Lack of Transportation (Non-Medical): No    Patient reports he does have some dizziness and isn't sure what is causing it but he quit taking the evening doses of his medications because of it.   Patient sometimes sleeps all day and usually only sleeps 2-3 hours at time at night which has happened ever since his wife died. He does feel tired during the day and does nap for an hour or two most days. He does not go to bed around the same time and has never discussed his problem with sleep with his PCP before. He keeps the TV on 24/7 for the noise.  Patient does not exercise because of his bad back pain which comes and goes. He also only eats one meal a day when he is hungry. He has  no specific schedule for that either and usually wants to rest after eating because it's one large meal.   CCM Care Plan  Allergies  Allergen Reactions   Codeine Anaphylaxis   Hydrocodone Itching    Medications Reviewed Today     Reviewed by Verner Chol, [facility] (Pharmacist) on 08/20/21 at 1008  Med List Status: <None>   Medication Order Taking? Sig Documenting Provider Last Dose Status Informant  amLODipine (NORVASC) 5 MG tablet [id number] Yes Take 1 tablet (5 mg total) by mouth daily. Little Ishikawa, MD Taking Active   apixaban (ELIQUIS) 5 MG TABS tablet [id number] Yes Take 1 tablet (5 mg total) by mouth 2 (two) times daily. [place], [name], MD Taking Active   DULoxetine (CYMBALTA) 30 MG capsule [id number] Yes TAKE ONE CAPSULE BY MOUTH DAILY [place], [name], MD Taking Active   EPINEPHrine (EPIPEN 2-PAK) 0.3 mg/0.3 mL IJ SOAJ injection [id number] Yes To use daily if needed for severe allergy reaction. [place], [name], MD Taking Active   irbesartan (AVAPRO) 150 MG tablet [id number] Yes Take 1 tablet (150 mg total) by mouth daily. [place], [name], MD Taking Active   omeprazole (PRILOSEC) 20 MG capsule [id number] Yes TAKE ONE CAPSULE BY MOUTH TWICE A DAY BEFORE MEALS  Patient taking differently: 20 mg daily. TAKE ONE CAPSULE BY MOUTH TWICE A DAY BEFORE MEALS   [place], [name], MD Taking Active   rosuvastatin (CRESTOR) 20 MG tablet [id number] Yes TAKE ONE TABLET BY MOUTH DAILY [place], [name], MD Taking Active   tiZANidine (ZANAFLEX) 2 MG tablet [id number] Yes TAKE ONE TABLET BY MOUTH TWICE A DAY AS NEEDED [place], [name], MD Taking Active             Patient Active Problem List  Diagnosis Date Noted   Aortic atherosclerosis ([place]) 02/14/2021   Vitamin D deficiency, unspecified 04/18/2019   Personal history of colonic polyps 02/01/2019   Screening for viral disease 02/01/2019   Nightmare disorder 08/12/2018   Chronic anticoagulation 04/04/2018   Angiodysplasia of cecum  11/23/2017   Actinic keratoses 10/01/2017   Unstable gait 06/08/2016   Genetic testing 04/29/2016   Ganglion of right wrist 04/28/2016   Depression, major, in partial remission ([place]) 04/16/2016   Encounter for therapeutic drug monitoring 08/16/2015   Alcohol use    Hand laceration    Hx of pulmonary emboli ([place]) 08/05/2015   Lung cancer ([place]) 08/05/2015   Syncope 08/05/2015   CAD (coronary artery disease) 08/05/2015   Kidney disease 08/05/2015   Bone lesion 08/05/2015   Prostate hypertrophy 08/05/2015   Hx of pulmonary embolus 60/12/9321   Complicated grieving 55/73/2202   Adenocarcinoma of right lung ([place]) 01/24/2015   Coronary atherosclerosis due to calcified coronary lesion of native artery 01/14/2015   Tremor 09/19/2014   COPD GOLD I 03/03/2013   Grieving 02/06/2013   Abnormal prostate on physical examination 08/01/2012   Polyposis coli - attenuated 09/12/2010   Peripheral vascular disease ([place]) 06/11/2008   Hyperlipemia, mixed 03/02/2008   SEBORRHEIC KERATOSIS 03/02/2008   BACK PAIN, LUMBAR, CHRONIC 09/30/2007   TOBACCO ABUSE 08/19/2007   Essential hypertension 08/19/2007   Multiple pulmonary nodules 08/19/2007    Immunization History  Administered Date(s) Administered   PFIZER(Purple Top)SARS-COV-2 Vaccination 11/18/2019, 12/14/2019   Pneumococcal Conjugate-13 02/06/2013   Pneumococcal Polysaccharide-23 08/01/2012   Td 04/02/2009, 09/25/2011   Tdap 09/09/2017   Zoster, Live 02/06/2013    Conditions to be addressed/monitored:  Hypertension, Hyperlipidemia, Coronary Artery Disease, GERD, COPD, Depression, and Tobacco use  Care Plan : [place]  Updates made by Viona Gilmore, [place] since 08/20/2021 12:00 AM     Problem: Problem: Hypertension, Hyperlipidemia, Coronary Artery Disease, GERD, COPD, Depression, and Tobacco use      Long-Range Goal: Patient-Specific Goal   Start Date: 08/20/2021  Expected End Date: 08/21/2022  This Visit's Progress: On  track  Priority: High  Note:   Current Barriers:  Unable to achieve control of blood pressure  Unable to self administer medications as prescribed Does not adhere to prescribed medication regimen Does not maintain contact with provider office  Pharmacist Clinical Goal(s):  Patient will verbalize ability to afford treatment regimen achieve adherence to monitoring guidelines and medication adherence to achieve therapeutic efficacy achieve control of blood pressure as evidenced by home and office blood pressue readings  through collaboration with PharmD and provider.   Interventions: 1:1 collaboration with [name], [name], MD regarding development and update of comprehensive plan of care as evidenced by provider attestation and co-signature Inter-disciplinary care team collaboration (see longitudinal plan of care) Comprehensive medication review performed; medication list updated in electronic medical record  Hypertension (BP goal <140/90) -Uncontrolled -Current treatment: Amlodipine 5 mg 1 tablet daily (AM) - Appropriate, Query effective, Safe, Accessible Irbesartan 150 mg 1 tablet daily (AM) - Appropriate, Query effective, Safe, Accessible -Medications previously tried: n/a  -Current home readings: 125/72, 105/55, 96/43, 153/89, 142/80, 168/90, 158/87, 165/88, 114/63, 104/48, 111/51, 106/62, 112/66 -Current dietary habits: does not limit sodium intake; cooks mostly at home and does not read package labels; doesn't eat frozen meals but does eat canned foods -Current exercise habits: not able to tolerate much due to back, able to walk a bit -Denies hypotensive/hypertensive symptoms -Educated on BP goals and benefits of  medications for prevention of heart attack, stroke and kidney damage; Exercise goal of 150 minutes per week; Proper BP monitoring technique; Symptoms of hypotension and importance of maintaining adequate hydration; -Counseled to monitor BP at home a few times a week,  document, and provide log at future appointments -Counseled on diet and exercise extensively Recommended to continue current medication Counseled on rinsing canned vegetables with use and switching to lower sodium salt. Recommended purchasing an arm blood pressure cuff as his current one is not accurate.  Hyperlipidemia: (LDL goal < 70) -Controlled -Current treatment: Rosuvastatin 20 mg 1 tablet daily - Appropriate, Effective, Safe, Accessible -Medications previously tried: none  -Current dietary patterns: eating at home mostly; does drink orange juice and soda frequently -Current exercise habits: limited in his ability to do with back pain -Educated on Cholesterol goals;  Benefits of statin for ASCVD risk reduction; -Counseled on diet and exercise extensively Recommended to continue current medication Recommended repeat lipid panel.  Depression (Goal: minimize symptoms) -Not ideally controlled -Current treatment: Duloxetine 30 mg 1 capsule daily - Appropriate, Query effective, Safe, Accessible -Medications previously tried/failed: n/a -PHQ9: 12 -GAD7: n/a -Educated on Benefits of medication for symptom control Benefits of cognitive-behavioral therapy with or without medication -Recommended to continue current medication Consider dose increase with PCP visit.  Tobacco use (Goal quit smoking) -Uncontrolled -Previous quit attempts: n/a -Current treatment  No medications -Patient smokes Within 30 minutes of waking -Patient triggers include:  enjoyment -On a scale of 1-10, reports MOTIVATION to quit is 1 -On a scale of 1-10, reports CONFIDENCE in quitting is 1 - Encouraged patient to even cut back on cigarettes per day.  History of PE (Goal: prevent blood clots) -Not ideally controlled -Current treatment  Eliquis 5 mg 1 tablet twice daily - taking once daily  - Appropriate, Query effective, Safe, Query accessible -Medications previously tried: warfarin (had PE)  -Counseled  on limiting use of NSAIDs due to risk of bleeding and taking Tylenol for pain instead. Recommended switching to Xarelto to improve compliance.  GERD (Goal: minimize symptoms) -Controlled -Current treatment  Omeprazole 20 mg 1 tablet twice daily  - taking once daily - Appropriate, Effective, Safe, Accessible -Medications previously tried: none  -Counseled on non-pharmacologic management of symptoms such as elevating the head of your bed, avoiding eating 2-3 hours before bed, avoiding triggering foods such as acidic, spicy, or fatty foods, eating smaller meals, and wearing clothes that are loose around the waist.   Health Maintenance -Vaccine gaps: COVID booster, shingrix -Current therapy:  Tizanidine 2 mg 1 tablet twice daily as needed - needs to take it PRN  Epipen as needed  -Educated on Cost vs benefit of each product must be carefully weighed by individual consumer -Patient is satisfied with current therapy and denies issues -Recommended taking tizanidine only as needed rather than scheduled to get more benefit for when he needs relief.   Patient Goals/Self-Care Activities Patient will:  - take medications as prescribed as evidenced by patient report and record review check blood pressure a few times a week, document, and provide at future appointments target a minimum of 150 minutes of moderate intensity exercise weekly  Follow Up Plan: The care management team will reach out to the patient again over the next 30 days.        Medication Assistance:  Working on PAP pending change in medication  Compliance/Adherence/Medication fill history: Care Gaps: Colonoscopy, COVID booster, shingrix BP- 168/71 (03/24/21)  Star-Rating Drugs: Irbesartan 150 mg - Last filled  04/23/21 90 DS at [facility] Rosuvastatin 20 mg - Last filled 04/25/21 90 DS at [facility]  Patient's preferred pharmacy is:  [facility] [id number] - [place], [address] [place] [address] Phone: [phone] Fax: [place] [address] [place] [address] Phone: [phone] Fax: [id number] [address] [place], [place] [address] Phone: [phone] Fax: [phone] - [place], [place] [address] Phone: [phone] Fax: [phone]  Uses pill box? Yes - weekly  Pt endorses 95% compliance - couple times a month - skipped   We discussed: Benefits of medication synchronization, packaging and delivery as well as enhanced pharmacist oversight with [employer]. Patient decided to: Continue current medication management strategy  Care Plan and Follow Up Patient Decision:  Patient agrees to Care Plan and Follow-up.  Plan: The care management team will reach out to the patient again over the next 30 days.  Jeni Salles, PharmD, [place] Pharmacist [facility] [place]

## 2021-08-19 NOTE — Chronic Care Management (AMB) (Signed)
Last fill dates verified as accurate   [place] Clinical Pharmacist Assistant [phone]

## 2021-08-20 ENCOUNTER — Ambulatory Visit (INDEPENDENT_AMBULATORY_CARE_PROVIDER_SITE_OTHER): Payer: Medicare HMO | Admitting: Pharmacist

## 2021-08-20 ENCOUNTER — Telehealth: Payer: Self-pay

## 2021-08-20 VITALS — BP 136/62

## 2021-08-20 DIAGNOSIS — Z86711 Personal history of pulmonary embolism: Secondary | ICD-10-CM

## 2021-08-20 DIAGNOSIS — I10 Essential (primary) hypertension: Secondary | ICD-10-CM

## 2021-08-20 MED ORDER — EPINEPHRINE 0.3 MG/0.3ML IJ SOAJ: INTRAMUSCULAR | 0 refills | Status: AC

## 2021-08-20 NOTE — Patient Instructions (Addendum)
Hi Joe,  It was great to get to meet you in person! Below is a summary of some of the topics we discussed.   Don't forget to: Try melatonin 2 or 3 mg timed release to help with sleep Work on getting a more regular sleep schedule and cutting out TV right before bed Try to get an arm blood pressure cuff to replace the wrist cuff Get your shingles shot at the pharmacy  Keep checking your blood pressure a couple times a week with the new cuff and bring it to your appointment with Dr. [name]  Please reach out to me if you have any questions or need anything before our follow up!  Best, [name]  Jeni Salles, PharmD, [facility]     Visit Information   Goals Addressed   None    Patient Care Plan: CCM Pharmacy Care Plan     Problem Identified: Problem: Hypertension, Hyperlipidemia, Coronary Artery Disease, GERD, COPD, Depression, and Tobacco use      Long-Range Goal: Patient-Specific Goal   Start Date: 08/20/2021  Expected End Date: 08/21/2022  This Visit's Progress: On track  Priority: High  Note:   Current Barriers:  Unable to achieve control of blood pressure  Unable to self administer medications as prescribed Does not adhere to prescribed medication regimen Does not maintain contact with provider office  Pharmacist Clinical Goal(s):  Patient will verbalize ability to afford treatment regimen achieve adherence to monitoring guidelines and medication adherence to achieve therapeutic efficacy achieve control of blood pressure as evidenced by home and office blood pressue readings  through collaboration with PharmD and provider.   Interventions: 1:1 collaboration with [name], [name], MD regarding development and update of comprehensive plan of care as evidenced by provider attestation and co-signature Inter-disciplinary care team collaboration (see longitudinal plan of care) Comprehensive medication review  performed; medication list updated in electronic medical record  Hypertension (BP goal <140/90) -Uncontrolled -Current treatment: Amlodipine 5 mg 1 tablet daily (AM) - Appropriate, Query effective, Safe, Accessible Irbesartan 150 mg 1 tablet daily (AM) - Appropriate, Query effective, Safe, Accessible -Medications previously tried: n/a  -Current home readings: 125/72, 105/55, 96/43, 153/89, 142/80, 168/90, 158/87, 165/88, 114/63, 104/48, 111/51, 106/62, 112/66 -Current dietary habits: does not limit sodium intake; cooks mostly at home and does not read package labels; doesn't eat frozen meals but does eat canned foods -Current exercise habits: not able to tolerate much due to back, able to walk a bit -Denies hypotensive/hypertensive symptoms -Educated on BP goals and benefits of medications for prevention of heart attack, stroke and kidney damage; Exercise goal of 150 minutes per week; Proper BP monitoring technique; Symptoms of hypotension and importance of maintaining adequate hydration; -Counseled to monitor BP at home a few times a week, document, and provide log at future appointments -Counseled on diet and exercise extensively Recommended to continue current medication Counseled on rinsing canned vegetables with use and switching to lower sodium salt. Recommended purchasing an arm blood pressure cuff as his current one is not accurate.  Hyperlipidemia: (LDL goal < 70) -Controlled -Current treatment: Rosuvastatin 20 mg 1 tablet daily - Appropriate, Effective, Safe, Accessible -Medications previously tried: none  -Current dietary patterns: eating at home mostly; does drink orange juice and soda frequently -Current exercise habits: limited in his ability to do with back pain -Educated on Cholesterol goals;  Benefits of statin for ASCVD risk reduction; -Counseled on diet and exercise extensively Recommended to continue current medication Recommended  repeat lipid panel.  Depression  (Goal: minimize symptoms) -Not ideally controlled -Current treatment: Duloxetine 30 mg 1 capsule daily - Appropriate, Query effective, Safe, Accessible -Medications previously tried/failed: n/a -PHQ9: 12 -GAD7: n/a -Educated on Benefits of medication for symptom control Benefits of cognitive-behavioral therapy with or without medication -Recommended to continue current medication Consider dose increase with PCP visit.  Tobacco use (Goal quit smoking) -Uncontrolled -Previous quit attempts: n/a -Current treatment  No medications -Patient smokes Within 30 minutes of waking -Patient triggers include:  enjoyment -On a scale of 1-10, reports MOTIVATION to quit is 1 -On a scale of 1-10, reports CONFIDENCE in quitting is 1 - Encouraged patient to even cut back on cigarettes per day.  History of PE (Goal: prevent blood clots) -Not ideally controlled -Current treatment  Eliquis 5 mg 1 tablet twice daily - taking once daily  - Appropriate, Query effective, Safe, Query accessible -Medications previously tried: warfarin (had PE)  -Counseled on limiting use of NSAIDs due to risk of bleeding and taking Tylenol for pain instead. Recommended switching to Xarelto to improve compliance.  GERD (Goal: minimize symptoms) -Controlled -Current treatment  Omeprazole 20 mg 1 tablet twice daily  - taking once daily - Appropriate, Effective, Safe, Accessible -Medications previously tried: none  -Counseled on non-pharmacologic management of symptoms such as elevating the head of your bed, avoiding eating 2-3 hours before bed, avoiding triggering foods such as acidic, spicy, or fatty foods, eating smaller meals, and wearing clothes that are loose around the waist.   Health Maintenance -Vaccine gaps: COVID booster, shingrix -Current therapy:  Tizanidine 2 mg 1 tablet twice daily as needed - needs to take it PRN  Epipen as needed  -Educated on Cost vs benefit of each product must be carefully weighed by  individual consumer -Patient is satisfied with current therapy and denies issues -Recommended taking tizanidine only as needed rather than scheduled to get more benefit for when he needs relief.   Patient Goals/Self-Care Activities Patient will:  - take medications as prescribed as evidenced by patient report and record review check blood pressure a few times a week, document, and provide at future appointments target a minimum of 150 minutes of moderate intensity exercise weekly  Follow Up Plan: The care management team will reach out to the patient again over the next 30 days.       Mr. Dorton was given information about Chronic Care Management services today including:  CCM service includes personalized support from designated clinical staff supervised by his physician, including individualized plan of care and coordination with other care providers 24/7 contact phone numbers for assistance for urgent and routine care needs. Standard insurance, coinsurance, copays and deductibles apply for chronic care management only during months in which we provide at least 20 minutes of these services. Most insurances cover these services at 100%, however patients may be responsible for any copay, coinsurance and/or deductible if applicable. This service may help you avoid the need for more expensive face-to-face services. Only one practitioner may furnish and bill the service in a calendar month. The patient may stop CCM services at any time (effective at the end of the month) by phone call to the office staff.  Patient agreed to services and verbal consent obtained.   The patient verbalized understanding of instructions, educational materials, and care plan provided today and agreed to receive a mailed copy of patient instructions, educational materials, and care plan.  The pharmacy team will reach out to the patient again over  the next 30 days.   Viona Gilmore, [facility]

## 2021-08-20 NOTE — Telephone Encounter (Signed)
-----   Message from Viona Gilmore, [facility] sent at 08/20/2021 12:15 PM EDT ----- Regarding: Epipen refill Hi,  When I saw Mr. Bright this morning he noticed that his Epipen had expired. Can you please send a refill of that to [facility]?  Thanks! [name]

## 2021-09-01 ENCOUNTER — Other Ambulatory Visit: Payer: Self-pay | Admitting: Family Medicine

## 2021-09-01 DIAGNOSIS — I10 Essential (primary) hypertension: Secondary | ICD-10-CM

## 2021-09-03 NOTE — Progress Notes (Deleted)
HPI: [name] is a 81 y.o.male here today for his routine physical examination.  Last CPE: 04/18/20  Regular exercise 3 or more times per week: *** Following a healthy diet: ***  Chronic medical problems: Tobacco use disorder,HLD, PE on chronic anticoagulation,HTN,unstable gait,essential tremor, lung cancer,and COPD among some.  Immunization History  Administered Date(s) Administered   PFIZER(Purple Top)SARS-COV-2 Vaccination 11/18/2019, 12/14/2019   Pneumococcal Conjugate-13 02/06/2013   Pneumococcal Polysaccharide-23 08/01/2012   Td 04/02/2009, 09/25/2011   Tdap 09/09/2017   Zoster, Live 02/06/2013    Health Maintenance  Topic Date Due   COVID-19 Vaccine (3 - Pfizer risk series) 01/11/2020   COLONOSCOPY (Pts 45-36yrs Insurance coverage will need to be confirmed)  02/29/2020   Zoster Vaccines- Shingrix (1 of 2) 03/21/2026 (Originally 03/12/1960)   TETANUS/TDAP  09/10/2027   Pneumonia Vaccine 58+ Years old  Completed   HPV VACCINES  Aged Out   INFLUENZA VACCINE  Discontinued    -Concerns and/or follow up today: *** Lab Results  Component Value Date   CHOL 135 03/14/2020   HDL 38 (L) 03/14/2020   LDLCALC 59 03/14/2020   LDLDIRECT 118.8 06/14/2006   TRIG 234 (H) 03/14/2020   CHOLHDL 3.6 03/14/2020   Lab Results  Component Value Date   CREATININE 1.05 03/18/2021   BUN 13 03/18/2021   NA 138 03/18/2021   K 3.6 03/18/2021   CL 104 03/18/2021   CO2 24 03/18/2021   Lab Results  Component Value Date   ALT 11 03/18/2021   AST 13 (L) 03/18/2021   ALKPHOS 84 03/18/2021   BILITOT 0.8 03/18/2021   Review of Systems  Current Outpatient Medications on File Prior to Visit  Medication Sig Dispense Refill   amLODipine (NORVASC) 5 MG tablet Take 1 tablet (5 mg total) by mouth daily. 90 tablet 3   apixaban (ELIQUIS) 5 MG TABS tablet Take 1 tablet (5 mg total) by mouth 2 (two) times daily. 180 tablet 3   DULoxetine (CYMBALTA) 30 MG capsule TAKE ONE CAPSULE BY  MOUTH DAILY 90 capsule 2   EPINEPHrine (EPIPEN 2-PAK) 0.3 mg/0.3 mL IJ SOAJ injection To use daily if needed for severe allergy reaction. 1 each 0   irbesartan (AVAPRO) 150 MG tablet TAKE ONE TABLET BY MOUTH DAILY 90 tablet 1   omeprazole (PRILOSEC) 20 MG capsule TAKE ONE CAPSULE BY MOUTH TWICE A DAY BEFORE MEALS (Patient taking differently: 20 mg daily. TAKE ONE CAPSULE BY MOUTH TWICE A DAY BEFORE MEALS) 180 capsule 1   rosuvastatin (CRESTOR) 20 MG tablet TAKE ONE TABLET BY MOUTH DAILY 90 tablet 2   tiZANidine (ZANAFLEX) 2 MG tablet TAKE ONE TABLET BY MOUTH TWICE A DAY AS NEEDED 60 tablet 3   No current facility-administered medications on file prior to visit.   Past Medical History:  Diagnosis Date   Adenocarcinoma of lung, stage 1 ([place])    Anemia    Angiodysplasia of cecum 11/23/2017   Multiple 1-2 mm   Arthritis    back possibly   Blood clotting disorder (HCC)    Cataract    removed bilaterally    Chronic obstructive pulmonary disease (COPD) ([place])    "patient not aware"   Clotting disorder ([place])    PE LUNG, dvt leg    Colon polyps    Depression    Diverticulosis    DVT (deep venous thrombosis) ([place])    Post op  knee surgery   DVT (deep venous thrombosis) (HCC)    GERD (gastroesophageal  reflux disease)    Histoplasmosis    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 diverticulosis also. 11/24/2010: 12 polyps removed - adenomas Anticipate routine repeat colonoscopy Spring 2013 Have recommended he see genetics counselor re ? Polyposis 03/27/2016 14 polyps  removed max 15 mm    Pulmonary nodule 06/2006   9 mm right upper obe pulmonary nodule (negative Pet Scan)   PVD (peripheral vascular disease) (HCC)    left leg s/p stent   Sebaceous cyst    neck   Seborrheic keratosis    Seizures ([place])    4-5 yrs ago had seizures when in ICU- none since this time    Sinusitis    Tobacco abuse    Tremor     Past Surgical History:  Procedure Laterality Date   ANGIOPLASTY / STENTING ILIAC  08/2008   Dr. Gray Bernhardt SURGERY  1975, 1978   x2 lumbar fusion over 30 years ago   BACK SURGERY     COLONOSCOPY  05/11/2006; 07/27/2006;05/05/2007, 11/24/2010   adenomas with 9 polyps index and 2.5 cm cecal polyp removed over 3 exams, last 2009 with APC. diverticulosis also.  2012: 12 adenomas removed, largest 8-10 mm, diverticulosis   CORONARY ANGIOPLASTY WITH STENT PLACEMENT     DESCENDING AORTIC ANEURYSM REPAIR W/ STENT  04/2010   Dr. Trula Slade   ESOPHAGOGASTRODUODENOSCOPY  05/05/2007   GERD - one erosion   EYE SURGERY Bilateral    cataract   KNEE SURGERY  2002   left- cartilage    LUNG BIOPSY     PERCUTANEOUS PINNING Left 06/24/2012   Procedure: Repair Complex Laceration Left Thumb with percutaneous pinning.;  Surgeon: Dennie Bible, MD;  Location: [place];  Service: Plastics;  Laterality: Left;   THUMB AMPUTATION Left    pinning and index finger   TONSILLECTOMY  1956   TONSILLECTOMY     UPPER GASTROINTESTINAL ENDOSCOPY     VIDEO ASSISTED THORACOSCOPY (VATS)/WEDGE RESECTION Right 01/24/2015   Procedure: RIGHT VIDEO ASSISTED THORACOSCOPY (VATS)/WEDGE RESECTION;  Surgeon: [name], MD;  Location: [place];  Service: Thoracic;  Laterality: Right;   vocal cord polyps removed      Allergies  Allergen Reactions   Codeine Anaphylaxis   Hydrocodone Itching    Family History  Problem Relation Age of Onset   Colon cancer Mother 1       multiple colon polyps   Deep vein thrombosis Mother    Cancer Mother 31       uterine cancer   Uterine cancer  Mother    Lung cancer Mother    Lung cancer Son        smoked and was a welder   Heart disease Brother    Diabetes Other    Heart disease Brother    Rheum arthritis Daughter    Rheum arthritis Sister    Colon polyps Neg Hx    Stomach cancer Neg Hx    Rectal cancer Neg Hx     Social  History   Socioeconomic History   Marital status: Widowed    Spouse name: Not on file   Number of children: 2   Years of education: Not on file   Highest education level: Not on file  Occupational History   Occupation: Retired    Fish farm manager: RETIRED  Tobacco Use   Smoking status: Every Day    Packs/day: 1.00    Years: 60.00    Total pack years: 60.00    Types: Cigarettes   Smokeless tobacco: Never  Vaping Use   Vaping Use: Never used  Substance and Sexual Activity   Alcohol use: Yes    Alcohol/week: 0.0 standard drinks of alcohol    Comment: quit drinking after last hospitalization- may 2017   Drug use: No   Sexual activity: Not Currently  Other Topics Concern   Not on file  Social History Narrative       Married for over 50 years.  Wife died from lung cancer.     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     Occasional alcohol.      Tobacco use - over 50 pack years.   Father died at age 59 of suicide.   Selinda Eon Ellis-daughter emergenc   y contact   Social Determinants of Health   Financial Resource Strain: Low Risk  (08/20/2021)   Overall Financial Resource Strain (CARDIA)    Difficulty of Paying Living Expenses: Not very hard  Food Insecurity: No Food Insecurity (05/08/2020)   Hunger Vital Sign    Worried About Running Out of Food in the Last Year: Never true    Ran Out of Food in the Last Year: Never true  Transportation Needs: No Transportation Needs (08/20/2021)   PRAPARE - Hydrologist (Medical): No    Lack of Transportation (Non-Medical): No  Physical Activity: Inactive (05/08/2020)   Exercise Vital Sign    Days of  Exercise per Week: 0 days    Minutes of Exercise per Session: 0 min  Stress: No Stress Concern Present (05/08/2020)   [place]    Feeling of Stress : Not at all  Social Connections: Socially Isolated (05/08/2020)   Social Connection and Isolation Panel [NHANES]    Frequency of Communication with Friends and Family: More than three times a week    Frequency of Social Gatherings with Friends and Family: More than three times a week    Attends Religious Services: Never    Marine scientist or Organizations: No    Attends Archivist Meetings: Never    Marital Status: Widowed   There were no vitals filed for this visit. There is no height or weight on file to calculate BMI.  Wt Readings from Last 3 Encounters:  03/24/21 168 lb 6.4 oz (76.4 kg)  02/14/21 159 lb 6.4 oz (72.3 kg)  09/17/20 163 lb 9.6 oz (74.2 kg)   Physical Exam  ASSESSMENT AND PLAN:  There are no diagnoses linked to this encounter. No orders of the defined types were placed in this encounter.  No problem-specific Assessment & Plan notes found for this encounter.  No follow-ups on file.   [name]. [name], MD  [facility]. [place] office.

## 2021-09-05 ENCOUNTER — Encounter: Payer: Medicare HMO | Admitting: Family Medicine

## 2021-09-05 DIAGNOSIS — I10 Essential (primary) hypertension: Secondary | ICD-10-CM

## 2021-09-05 DIAGNOSIS — Z0000 Encounter for general adult medical examination without abnormal findings: Secondary | ICD-10-CM

## 2021-09-05 DIAGNOSIS — I700 Atherosclerosis of aorta: Secondary | ICD-10-CM

## 2021-09-05 DIAGNOSIS — E782 Mixed hyperlipidemia: Secondary | ICD-10-CM

## 2021-09-08 ENCOUNTER — Telehealth: Payer: Self-pay | Admitting: Family Medicine

## 2021-09-12 DIAGNOSIS — I10 Essential (primary) hypertension: Secondary | ICD-10-CM | POA: Diagnosis not present

## 2021-09-12 DIAGNOSIS — F17210 Nicotine dependence, cigarettes, uncomplicated: Secondary | ICD-10-CM

## 2021-09-12 DIAGNOSIS — F32A Depression, unspecified: Secondary | ICD-10-CM

## 2021-09-12 DIAGNOSIS — E785 Hyperlipidemia, unspecified: Secondary | ICD-10-CM

## 2021-10-10 NOTE — Progress Notes (Unsigned)
HPI: [name] is a 81 y.o.male here today for his routine physical examination.  Last CPE: 04/18/20  He does not have an exercise routine, active with chores around his house.  He eats what he wants and when he is hungry, does not have scheduled meals.  Chronic medical problems: HLD,COPD,HTN,GERD,chronic back pain,tremor,hx of PE,lung cancer, depression,CAD, and PVD among some.  Immunization History  Administered Date(s) Administered   PFIZER(Purple Top)SARS-COV-2 Vaccination 11/18/2019, 12/14/2019   Pneumococcal Conjugate-13 02/06/2013   Pneumococcal Polysaccharide-23 08/01/2012   Td 04/02/2009, 09/25/2011   Tdap 09/09/2017   Zoster, Live 02/06/2013   Health Maintenance  Topic Date Due   COVID-19 Vaccine (3 - Pfizer risk series) 10/29/2021 (Originally 01/11/2020)   Zoster Vaccines- Shingrix (1 of 2) 03/21/2026 (Originally 03/12/1960)   TETANUS/TDAP  09/10/2027   Pneumonia Vaccine 10+ Years old  Completed   HPV VACCINES  Aged Out   INFLUENZA VACCINE  Discontinued   COLONOSCOPY (Pts 45-92yrs Insurance coverage will need to be confirmed)  Discontinued   HLD on Rosuvastatin 20 mg daily. Tolerating medication well. + Smoker, not interested in smoking cessation. Component     Latest Ref Rng 03/14/2020  Cholesterol, Total     100 - 199 mg/dL 135   Triglycerides     0.0 - 149.0 mg/dL 234 (H)   HDL Cholesterol     >39.00 mg/dL 38 (L)   VLDL Cholesterol Cal     5 - 40 mg/dL 38   LDL Chol Calc (NIH)     0 - 99 mg/dL 59   Total CHOL/HDL Ratio 3.6     Hx of PE, he is missing second dose of Eliquis frequently.  Negative for CP or SOB. COPD: Productive cough in the morning, stable for years. Negative for hemoptysis.  Follows with pulmonologist and oncologist.  HTN on Amlodipine 5 mg daily and Irbesartan 150 mg daily. He does not check BP regularly. CAD and PVD, he follows with cardiologist. Lab Results  Component Value Date   CREATININE 1.05 03/18/2021   BUN  13 03/18/2021   NA 138 03/18/2021   K 3.6 03/18/2021   CL 104 03/18/2021   CO2 24 03/18/2021   Lab Results  Component Value Date   ALT 11 03/18/2021   AST 13 (L) 03/18/2021   ALKPHOS 84 03/18/2021   BILITOT 0.8 03/18/2021   Chronic upper and lower back pain: He is on Duloxetine 30 mg daily. Medication also helps with depression.  Review of Systems  Constitutional:  Positive for fatigue. Negative for activity change, appetite change and fever.  HENT:  Negative for dental problem, nosebleeds and sore throat.   Eyes:  Negative for redness and visual disturbance.  Respiratory:  Positive for cough. Negative for shortness of breath and wheezing.   Cardiovascular:  Negative for chest pain, palpitations and leg swelling.  Gastrointestinal:  Negative for abdominal pain, blood in stool, nausea and vomiting.  Endocrine: Negative for cold intolerance, heat intolerance, polydipsia, polyphagia and polyuria.  Genitourinary:  Negative for decreased urine volume, dysuria, genital sores, hematuria and testicular pain.  Musculoskeletal:  Positive for arthralgias, back pain and gait problem.  Skin:  Negative for color change and rash.  Neurological:  Negative for syncope, weakness and headaches.  Hematological:  Negative for adenopathy. Does not bruise/bleed easily.  Psychiatric/Behavioral:  Negative for confusion and hallucinations.    Current Outpatient Medications on File Prior to Visit  Medication Sig Dispense Refill   amLODipine (NORVASC) 5 MG tablet Take 1  tablet (5 mg total) by mouth daily. 90 tablet 3   DULoxetine (CYMBALTA) 30 MG capsule TAKE ONE CAPSULE BY MOUTH DAILY 90 capsule 2   EPINEPHrine (EPIPEN 2-PAK) 0.3 mg/0.3 mL IJ SOAJ injection To use daily if needed for severe allergy reaction. 1 each 0   irbesartan (AVAPRO) 150 MG tablet TAKE ONE TABLET BY MOUTH DAILY 90 tablet 1   omeprazole (PRILOSEC) 20 MG capsule TAKE ONE CAPSULE BY MOUTH TWICE A DAY BEFORE MEALS (Patient taking  differently: 20 mg daily. TAKE ONE CAPSULE BY MOUTH TWICE A DAY BEFORE MEALS) 180 capsule 1   rosuvastatin (CRESTOR) 20 MG tablet TAKE ONE TABLET BY MOUTH DAILY 90 tablet 2   tiZANidine (ZANAFLEX) 2 MG tablet TAKE ONE TABLET BY MOUTH TWICE A DAY AS NEEDED 60 tablet 3   No current facility-administered medications on file prior to visit.   Past Medical History:  Diagnosis Date   Adenocarcinoma of lung, stage 1 ([place])    Anemia    Angiodysplasia of cecum 11/23/2017   Multiple 1-2 mm   Arthritis    back possibly   Blood clotting disorder (HCC)    Cataract    removed bilaterally    Chronic obstructive pulmonary disease (COPD) (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 diverticulosis also. 11/24/2010: 12 polyps removed - adenomas Anticipate routine repeat colonoscopy Spring 2013 Have recommended he see genetics counselor re ? Polyposis 03/27/2016 14 polyps removed max 15 mm    Pulmonary nodule 06/2006   9 mm right upper obe pulmonary nodule (negative Pet Scan)   PVD (peripheral vascular disease) (HCC)    left leg s/p stent   Sebaceous cyst    neck   Seborrheic keratosis    Seizures ([place])    4-5 yrs ago had seizures when in ICU- none since this time    Sinusitis    Tobacco  abuse    Tremor    Past Surgical History:  Procedure Laterality Date   ANGIOPLASTY / STENTING ILIAC  08/2008   Dr. Gray Bernhardt SURGERY  1975, 1978   x2 lumbar fusion over 30 years ago   BACK SURGERY     COLONOSCOPY  05/11/2006; 07/27/2006;05/05/2007, 11/24/2010   adenomas with 9 polyps index and 2.5 cm cecal polyp removed over 3 exams, last 2009 with APC. diverticulosis also.  2012: 12 adenomas removed, largest 8-10 mm, diverticulosis   CORONARY ANGIOPLASTY WITH STENT PLACEMENT     DESCENDING AORTIC ANEURYSM REPAIR W/ STENT  04/2010   Dr. Trula Slade   ESOPHAGOGASTRODUODENOSCOPY  05/05/2007   GERD - one erosion   EYE SURGERY  Bilateral    cataract   KNEE SURGERY  2002   left- cartilage    LUNG BIOPSY     PERCUTANEOUS PINNING Left 06/24/2012   Procedure: Repair Complex Laceration Left Thumb with percutaneous pinning.;  Surgeon: Dennie Bible, MD;  Location: [facility];  Service: Plastics;  Laterality: Left;   THUMB AMPUTATION Left    pinning and index finger   TONSILLECTOMY  1956   TONSILLECTOMY     UPPER GASTROINTESTINAL ENDOSCOPY     VIDEO ASSISTED THORACOSCOPY (VATS)/WEDGE RESECTION Right 01/24/2015   Procedure: RIGHT VIDEO ASSISTED THORACOSCOPY (VATS)/WEDGE RESECTION;  Surgeon: [name], MD;  Location: [facility];  Service: Thoracic;  Laterality: Right;   vocal cord polyps removed      Allergies  Allergen Reactions   Codeine Anaphylaxis   Hydrocodone Itching   Family History  Problem Relation Age of Onset   Colon cancer Mother 55       multiple colon polyps   Deep vein thrombosis Mother    Cancer Mother 58       uterine cancer   Uterine cancer Mother    Lung cancer Mother    Lung cancer Son        smoked and was a welder   Heart disease Brother    Diabetes Other    Heart disease Brother    Rheum arthritis Daughter    Rheum arthritis Sister    Colon polyps Neg Hx    Stomach cancer Neg Hx    Rectal cancer Neg Hx    Social History   Socioeconomic History    Marital status: Widowed    Spouse name: Not on file   Number of children: 2   Years of education: Not on file   Highest education level: Not on file  Occupational History   Occupation: Retired    Fish farm manager: RETIRED  Tobacco Use   Smoking status: Every Day    Packs/day: 1.00    Years: 60.00    Total pack years: 60.00    Types: Cigarettes   Smokeless tobacco: Never  Vaping Use   Vaping Use: Never used  Substance and Sexual Activity   Alcohol use: Yes    Alcohol/week: 0.0 standard drinks of alcohol    Comment: quit drinking after last hospitalization- may 2017   Drug use: No   Sexual activity: Not Currently  Other Topics Concern   Not on file  Social History Narrative       Married for over 50 years.  Wife died from lung cancer.     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     Occasional alcohol.      Tobacco use - over 50 pack years.   Father died at age 28 of suicide.   Selinda Eon Ellis-daughter emergenc   y contact   Social Determinants of Health   Financial Resource Strain: Low Risk  (08/20/2021)   Overall Financial Resource Strain (CARDIA)    Difficulty of Paying Living Expenses: Not very hard  Food Insecurity: No Food Insecurity (05/08/2020)   Hunger Vital Sign    Worried About Running Out of Food in the Last Year: Never true    Ran Out of Food in the Last Year: Never true  Transportation Needs: No Transportation Needs (08/20/2021)   PRAPARE - Hydrologist (Medical): No    Lack of Transportation (Non-Medical): No  Physical Activity: Inactive (05/08/2020)   Exercise Vital  Sign    Days of Exercise per Week: 0 days    Minutes of Exercise per Session: 0 min  Stress: No Stress Concern Present (05/08/2020)   [place]    Feeling of Stress : Not at all  Social Connections: Socially Isolated (05/08/2020)   Social Connection and Isolation Panel [NHANES]     Frequency of Communication with Friends and Family: More than three times a week    Frequency of Social Gatherings with Friends and Family: More than three times a week    Attends Religious Services: Never    Marine scientist or Organizations: No    Attends Archivist Meetings: Never    Marital Status: Widowed   Vitals:   10/13/21 1414  BP: 120/70  Pulse: 88  Resp: 16  SpO2: 97%  Body mass index is 26.19 kg/m. Wt Readings from Last 3 Encounters:  10/13/21 172 lb 4 oz (78.1 kg)  03/24/21 168 lb 6.4 oz (76.4 kg)  02/14/21 159 lb 6.4 oz (72.3 kg)   Physical Exam Vitals and nursing note reviewed.  Constitutional:      General: He is not in acute distress.    Appearance: He is well-developed.  HENT:     Head: Normocephalic and atraumatic.     Right Ear: External ear normal. Tympanic membrane is not erythematous.     Left Ear: Tympanic membrane and external ear normal.     Ears:     Comments: Cerumen excess R>L, right TM seen partially.    Mouth/Throat:     Dentition: Has dentures.  Eyes:     Extraocular Movements: Extraocular movements intact.     Conjunctiva/sclera: Conjunctivae normal.     Pupils: Pupils are equal, round, and reactive to light.  Neck:     Thyroid: No thyromegaly.     Trachea: No tracheal deviation.  Cardiovascular:     Rate and Rhythm: Normal rate. Rhythm irregular. Occasional Extrasystoles are present.    Heart sounds: No murmur heard.    Comments: DP pulses present. Pulmonary:     Effort: Pulmonary effort is normal. No respiratory distress.     Breath sounds: Normal breath sounds.  Abdominal:     Palpations: Abdomen is soft. There is no mass.     Tenderness: There is no abdominal tenderness.  Genitourinary:    Comments: No concerns. Musculoskeletal:        General: No tenderness.     Cervical back: Normal range of motion.     Right lower leg: No edema.     Left lower leg: No edema.     Comments: No signs of synovitis.   Lymphadenopathy:     Cervical: No cervical adenopathy.     Upper Body:     Right upper body: No supraclavicular adenopathy.     Left upper body: No supraclavicular adenopathy.  Skin:    General: Skin is warm.     Findings: No erythema.  Neurological:     General: No focal deficit present.     Mental Status: He is alert and oriented to person, place, and time.     Cranial Nerves: No cranial nerve deficit.     Motor: Tremor present.     Deep Tendon Reflexes:     Reflex Scores:      Bicep reflexes are 2+ on the right side and 2+ on the left side.      Patellar reflexes are 2+ on  the right side and 2+ on the left side.    Comments: Mildly unstable gait, not assisted.  Psychiatric:        Mood and Affect: Mood and affect normal.   ASSESSMENT AND PLAN:  Mr.Lejon was seen today for annual exam.  Diagnoses and all orders for this visit: Orders Placed This Encounter  Procedures   LDL cholesterol, direct   EKG 12-Lead   Lab Results  Component Value Date   CHOL 143 10/13/2021   HDL 40.70 10/13/2021   LDLCALC 59 03/14/2020   LDLDIRECT 79.0 10/13/2021   TRIG 220.0 (H) 10/13/2021   CHOLHDL 4 10/13/2021   Routine general medical examination at a health care facility We discussed the importance of regular physical activity and healthy diet for prevention of chronic illness and/or complications. Preventive guidelines reviewed. Vaccination up to date. He is planning on getting shingrix. Next CPE in a year.  Essential hypertension BP adequately controlled. No changes in current management. Eye exam is current.  Hyperlipemia, mixed Continue Rosuvastatin 20 mg daily. Low fat diet also recommended.  Irregular heart rate Mild, asymptomatic. EKG today SR , normal axis. Occasional PAC's and PVC's, these are new compared with prior EKG's.  He follows with cardiologist , last visit 08/2020, he was supposed to follow in 6 months. Instructed about warning signs.  Chronic obstructive  pulmonary disease, unspecified COPD type ([place]) Stable. He is not interested in smoking cessation. He has tried a few times in the past,unsuccessfully.  Recurrent pulmonary emboli (HCC) -     rivaroxaban (XARELTO) 10 MG TABS tablet; Take 1 tablet (10 mg total) by mouth daily.  Essential hypertension BP adequately controlled. No changes in current management. Eye exam is current.  Hyperlipemia, mixed LDL goal < 70. Continue Rosuvastatin 20 mg daily and low fat diet. Further recommendations according to FLP result.  Depression, major, in partial remission ([place]) Stable. Continue Duloxetine 30 mg daily.  Return in 6 months (on 04/15/2022).  Uvaldo Rybacki G. [name], MD  [facility]. [place] office.

## 2021-10-13 ENCOUNTER — Ambulatory Visit (INDEPENDENT_AMBULATORY_CARE_PROVIDER_SITE_OTHER): Payer: Medicare HMO | Admitting: Family Medicine

## 2021-10-13 ENCOUNTER — Encounter: Payer: Self-pay | Admitting: Family Medicine

## 2021-10-13 VITALS — BP 120/70 | HR 88 | Resp 16 | Ht 68.0 in | Wt 172.2 lb

## 2021-10-13 DIAGNOSIS — I2699 Other pulmonary embolism without acute cor pulmonale: Secondary | ICD-10-CM

## 2021-10-13 DIAGNOSIS — Z0000 Encounter for general adult medical examination without abnormal findings: Secondary | ICD-10-CM

## 2021-10-13 DIAGNOSIS — I10 Essential (primary) hypertension: Secondary | ICD-10-CM

## 2021-10-13 DIAGNOSIS — J449 Chronic obstructive pulmonary disease, unspecified: Secondary | ICD-10-CM | POA: Diagnosis not present

## 2021-10-13 DIAGNOSIS — F3341 Major depressive disorder, recurrent, in partial remission: Secondary | ICD-10-CM | POA: Diagnosis not present

## 2021-10-13 DIAGNOSIS — E782 Mixed hyperlipidemia: Secondary | ICD-10-CM | POA: Diagnosis not present

## 2021-10-13 DIAGNOSIS — I499 Cardiac arrhythmia, unspecified: Secondary | ICD-10-CM

## 2021-10-13 MED ORDER — RIVAROXABAN 10 MG PO TABS: 10.0000 mg | ORAL_TABLET | Freq: Every day | ORAL | 2 refills | Status: DC

## 2021-10-13 NOTE — Patient Instructions (Addendum)
A few things to remember from today's visit:  Routine general medical examination at a health care facility  Essential hypertension  Hyperlipemia, mixed - Plan: Lipid panel  Irregular heart rate - Plan: EKG 12-Lead  Hx of pulmonary embolus - Plan: rivaroxaban (XARELTO) 10 MG TABS tablet  Chronic obstructive pulmonary disease, unspecified COPD type ([place])  Vitamin D deficiency, unspecified  If you need refills please call your pharmacy. Do not use My Chart to request refills or for acute issues that need immediate attention.   Today we are changing Eliquis for Xarelto. Stop Eliquis and start Xarelto when you are due for next Eliquis dose and with food.  Please be sure medication list is accurate. If a new problem present, please set up appointment sooner than planned today. Preventive Care 35 Years and Older, Male Preventive care refers to lifestyle choices and visits with your health care provider that can promote health and wellness. Preventive care visits are also called wellness exams. What can I expect for my preventive care visit? Counseling During your preventive care visit, your health care provider may ask about your: Medical history, including: Past medical problems. Family medical history. History of falls. Current health, including: Emotional well-being. Home life and relationship well-being. Sexual activity. Memory and ability to understand (cognition). Lifestyle, including: Alcohol, nicotine or tobacco, and drug use. Access to firearms. Diet, exercise, and sleep habits. Work and work Statistician. Sunscreen use. Safety issues such as seatbelt and bike helmet use. Physical exam Your health care provider will check your: Height and weight. These may be used to calculate your BMI (body mass index). BMI is a measurement that tells if you are at a healthy weight. Waist circumference. This measures the distance around your waistline. This measurement also tells if  you are at a healthy weight and may help predict your risk of certain diseases, such as type 2 diabetes and high blood pressure. Heart rate and blood pressure. Body temperature. Skin for abnormal spots. What immunizations do I need?  Vaccines are usually given at various ages, according to a schedule. Your health care provider will recommend vaccines for you based on your age, medical history, and lifestyle or other factors, such as travel or where you work. What tests do I need? Screening Your health care provider may recommend screening tests for certain conditions. This may include: Lipid and cholesterol levels. Diabetes screening. This is done by checking your blood sugar (glucose) after you have not eaten for a while (fasting). Hepatitis C test. Hepatitis B test. HIV (human immunodeficiency virus) test. STI (sexually transmitted infection) testing, if you are at risk. Lung cancer screening. Colorectal cancer screening. Prostate cancer screening. Abdominal aortic aneurysm (AAA) screening. You may need this if you are a current or former smoker. Talk with your health care provider about your test results, treatment options, and if necessary, the need for more tests. Follow these instructions at home: Eating and drinking  Eat a diet that includes fresh fruits and vegetables, whole grains, lean protein, and low-fat dairy products. Limit your intake of foods with high amounts of sugar, saturated fats, and salt. Take vitamin and mineral supplements as recommended by your health care provider. Do not drink alcohol if your health care provider tells you not to drink. If you drink alcohol: Limit how much you have to 0-2 drinks a day. Know how much alcohol is in your drink. In the [place] one drink equals one 12 oz bottle of beer (355 mL), one 5 oz glass  of wine (148 mL), or one 1 oz glass of hard liquor (44 mL). Lifestyle Brush your teeth every morning and night with fluoride toothpaste.  Floss one time each day. Exercise for at least 30 minutes 5 or more days each week. Do not use any products that contain nicotine or tobacco. These products include cigarettes, chewing tobacco, and vaping devices, such as e-cigarettes. If you need help quitting, ask your health care provider. Do not use drugs. If you are sexually active, practice safe sex. Use a condom or other form of protection to prevent STIs. Take aspirin only as told by your health care provider. Make sure that you understand how much to take and what form to take. Work with your health care provider to find out whether it is safe and beneficial for you to take aspirin daily. Ask your health care provider if you need to take a cholesterol-lowering medicine (statin). Find healthy ways to manage stress, such as: Meditation, yoga, or listening to music. Journaling. Talking to a trusted person. Spending time with friends and family. Safety Always wear your seat belt while driving or riding in a vehicle. Do not drive: If you have been drinking alcohol. Do not ride with someone who has been drinking. When you are tired or distracted. While texting. If you have been using any mind-altering substances or drugs. Wear a helmet and other protective equipment during sports activities. If you have firearms in your house, make sure you follow all gun safety procedures. Minimize exposure to UV radiation to reduce your risk of skin cancer. What's next? Visit your health care provider once a year for an annual wellness visit. Ask your health care provider how often you should have your eyes and teeth checked. Stay up to date on all vaccines. This information is not intended to replace advice given to you by your health care provider. Make sure you discuss any questions you have with your health care provider. Document Revised: 08/28/2020 Document Reviewed: 08/28/2020 Elsevier Patient Education  [place].

## 2021-10-13 NOTE — Assessment & Plan Note (Signed)
Stable. Continue Duloxetine 30 mg daily.

## 2021-10-13 NOTE — Assessment & Plan Note (Signed)
BP adequately controlled. No changes in current management. Eye exam is current.

## 2021-10-13 NOTE — Assessment & Plan Note (Addendum)
LDL goal < 70. Continue Rosuvastatin 20 mg daily and low fat diet. Further recommendations according to FLP result.

## 2021-10-14 LAB — LIPID PANEL
Cholesterol: 143 mg/dL (ref 0–200)
HDL: 40.7 mg/dL (ref >39.00)
NonHDL: 101.82
Total CHOL/HDL Ratio: 4
Triglycerides: 220 mg/dL — ABNORMAL HIGH (ref 0.0–149.0)
VLDL: 44 mg/dL — ABNORMAL HIGH (ref 0.0–40.0)

## 2021-10-14 LAB — LDL CHOLESTEROL, DIRECT: Direct LDL: 79 mg/dL

## 2021-10-16 ENCOUNTER — Encounter: Payer: Self-pay | Admitting: Family Medicine

## 2021-11-03 ENCOUNTER — Ambulatory Visit (INDEPENDENT_AMBULATORY_CARE_PROVIDER_SITE_OTHER): Payer: Medicare HMO

## 2021-11-03 VITALS — Ht 69.0 in | Wt 170.0 lb

## 2021-11-03 DIAGNOSIS — Z0000 Encounter for general adult medical examination without abnormal findings: Secondary | ICD-10-CM | POA: Diagnosis not present

## 2021-11-03 NOTE — Progress Notes (Signed)
I connected with Timothy Wood today by telephone and verified that I am speaking with the correct person using two identifiers. Location patient: home Location provider: work Persons participating in the virtual visit: Ezekial Arns, Glenna Durand LPN.   I discussed the limitations, risks, security and privacy concerns of performing an evaluation and management service by telephone and the availability of in person appointments. I also discussed with the patient that there may be a patient responsible charge related to this service. The patient expressed understanding and verbally consented to this telephonic visit.    Interactive audio and video telecommunications were attempted between this provider and patient, however failed, due to patient having technical difficulties OR patient did not have access to video capability.  We continued and completed visit with audio only.     Vital signs may be patient reported or missing.  Subjective:   Timothy Wood is a 81 y.o. male who presents for Medicare Annual/Subsequent preventive examination.  Review of Systems     Cardiac Risk Factors include: advanced age (>77men, >19 women);dyslipidemia;hypertension;male gender;smoking/ tobacco exposure     Objective:    Today's Vitals   11/03/21 1126  Weight: 170 lb (77.1 kg)  Height: 5\' 9"  (1.753 m)  PainSc: 5    Body mass index is 25.1 kg/m.     11/03/2021   11:37 AM 03/24/2021    9:14 AM 05/08/2020    2:24 PM 11/23/2017   12:52 PM 04/21/2017   10:45 AM 10/14/2016    9:41 AM 04/15/2016   11:26 AM  Advanced Directives  Does Patient Have a Medical Advance Directive? Yes No No No No No No  Type of Advance Directive [place] in Chart? No - copy requested        Would patient like information on creating a medical advance directive?   Yes (MAU/Ambulatory/Procedural Areas - Information given)        Current Medications  (verified) Outpatient Encounter Medications as of 11/03/2021  Medication Sig   amLODipine (NORVASC) 5 MG tablet Take 1 tablet (5 mg total) by mouth daily.   DULoxetine (CYMBALTA) 30 MG capsule TAKE ONE CAPSULE BY MOUTH DAILY   irbesartan (AVAPRO) 150 MG tablet TAKE ONE TABLET BY MOUTH DAILY   omeprazole (PRILOSEC) 20 MG capsule TAKE ONE CAPSULE BY MOUTH TWICE A DAY BEFORE MEALS (Patient taking differently: 20 mg daily. TAKE ONE CAPSULE BY MOUTH TWICE A DAY BEFORE MEALS)   rosuvastatin (CRESTOR) 20 MG tablet TAKE ONE TABLET BY MOUTH DAILY   tiZANidine (ZANAFLEX) 2 MG tablet TAKE ONE TABLET BY MOUTH TWICE A DAY AS NEEDED   EPINEPHrine (EPIPEN 2-PAK) 0.3 mg/0.3 mL IJ SOAJ injection To use daily if needed for severe allergy reaction.   rivaroxaban (XARELTO) 10 MG TABS tablet Take 1 tablet (10 mg total) by mouth daily. (Patient not taking: Reported on 11/03/2021)   No facility-administered encounter medications on file as of 11/03/2021.    Allergies (verified) Codeine and Hydrocodone   History: Past Medical History:  Diagnosis Date   Adenocarcinoma of lung, stage 1 ([place])    Anemia    Angiodysplasia of cecum 11/23/2017   Multiple 1-2 mm   Arthritis    back possibly   Blood clotting disorder (HCC)    Cataract    removed bilaterally    Chronic obstructive pulmonary disease (COPD) ([place])    "patient not aware"   Clotting disorder ([place])  PE LUNG, dvt leg    Colon polyps    Depression    Diverticulosis    DVT (deep venous thrombosis) (HCC)    Post op  knee surgery   DVT (deep venous thrombosis) (HCC)    GERD (gastroesophageal reflux disease)    Histoplasmosis    Histoplasmosis with pneumonia ([place]) 04/01/2015   Hypercholesteremia    Hyperlipidemia    Hypertension    Laceration of head 08/05/2015   Lumbar compression fracture ([place])    Lung cancer ([place]) dx'd 2016   Personal history of colonic polyps 2008   multiple adenomas 2008-2009 exams with large cecal adenoma   Pneumonia    in  history   Polyposis coli - attenuated 09/12/2010   2008: adenomas with 9 polyps index and 2.5 cm cecal polyp removed over 3 exams, last 2009 with APC. diverticulosis also. 11/24/2010: 12 polyps removed - adenomas Anticipate routine repeat colonoscopy Spring 2013 Have recommended he see genetics counselor re ? Polyposis 03/27/2016 14 polyps removed max 15 mm    Pulmonary nodule 06/2006   9 mm right upper obe pulmonary nodule (negative Pet Scan)   PVD (peripheral vascular disease) (HCC)    left leg s/p stent   Sebaceous cyst    neck   Seborrheic keratosis    Seizures ([place])    4-5 yrs ago had seizures when in ICU- none since this time    Sinusitis    Tobacco abuse    Tremor    Past Surgical History:  Procedure Laterality Date   ANGIOPLASTY / STENTING ILIAC  08/2008   Dr. Gray Bernhardt SURGERY  1975, 1978   x2 lumbar fusion over 30 years ago   BACK SURGERY     COLONOSCOPY  05/11/2006; 07/27/2006;05/05/2007, 11/24/2010   adenomas with 9 polyps index and 2.5 cm cecal polyp removed over 3 exams, last 2009 with APC. diverticulosis also.  2012: 12 adenomas removed, largest 8-10 mm, diverticulosis   CORONARY ANGIOPLASTY WITH STENT PLACEMENT     DESCENDING AORTIC ANEURYSM REPAIR W/ STENT  04/2010   Dr. Trula Slade   ESOPHAGOGASTRODUODENOSCOPY  05/05/2007   GERD - one erosion   EYE SURGERY Bilateral    cataract   KNEE SURGERY  2002   left- cartilage    LUNG BIOPSY     PERCUTANEOUS PINNING Left 06/24/2012   Procedure: Repair Complex Laceration Left Thumb with percutaneous pinning.;  Surgeon: Dennie Bible, MD;  Location: [facility];  Service: Plastics;  Laterality: Left;   THUMB AMPUTATION Left    pinning and index finger   TONSILLECTOMY  1956   TONSILLECTOMY     UPPER GASTROINTESTINAL ENDOSCOPY     VIDEO ASSISTED THORACOSCOPY (VATS)/WEDGE RESECTION Right 01/24/2015   Procedure: RIGHT VIDEO ASSISTED THORACOSCOPY (VATS)/WEDGE RESECTION;  Surgeon: [name], MD;  Location: [facility];  Service:  Thoracic;  Laterality: Right;   vocal cord polyps removed     Family History  Problem Relation Age of Onset   Colon cancer Mother 36       multiple colon polyps   Deep vein thrombosis Mother    Cancer Mother 46       uterine cancer   Uterine cancer Mother    Lung cancer Mother    Lung cancer Son        smoked and was a welder   Heart disease Brother    Diabetes Other    Heart disease Brother    Rheum arthritis Daughter    Rheum  arthritis Sister    Colon polyps Neg Hx    Stomach cancer Neg Hx    Rectal cancer Neg Hx    Social History   Socioeconomic History   Marital status: Widowed    Spouse name: Not on file   Number of children: 2   Years of education: Not on file   Highest education level: Not on file  Occupational History   Occupation: Retired    Fish farm manager: RETIRED  Tobacco Use   Smoking status: Every Day    Packs/day: 1.00    Years: 60.00    Total pack years: 60.00    Types: Cigarettes   Smokeless tobacco: Never  Vaping Use   Vaping Use: Never used  Substance and Sexual Activity   Alcohol use: Yes    Comment: beer on occasion   Drug use: No   Sexual activity: Not Currently  Other Topics Concern   Not on file  Social History Narrative       Married for over 50 years.  Wife died from lung cancer.     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     Occasional alcohol.      Tobacco use - over 50 pack years.   Father died at age 55 of suicide.   Selinda Eon Ellis-daughter emergenc   y contact   Social Determinants of Health   Financial Resource Strain: Low Risk  (11/03/2021)   Overall Financial Resource Strain (CARDIA)    Difficulty of Paying Living Expenses: Not hard at all  Food Insecurity: No Food Insecurity (11/03/2021)   Hunger Vital Sign    Worried About Running Out of Food in the Last Year: Never true    Ran Out of Food in the Last Year: Never true  Transportation Needs: No Transportation Needs (11/03/2021)   PRAPARE -  Hydrologist (Medical): No    Lack of Transportation (Non-Medical): No  Physical Activity: Inactive (11/03/2021)   Exercise Vital Sign    Days of Exercise per Week: 0 days    Minutes of Exercise per Session: 0 min  Stress: Stress Concern Present (11/03/2021)   [place]    Feeling of Stress : To some extent  Social Connections: Socially Isolated (05/08/2020)   Social Connection and Isolation Panel [NHANES]    Frequency of Communication with Friends and Family: More than three times a week    Frequency of Social Gatherings with Friends and Family: More than three times a week    Attends Religious Services: Never    Marine scientist or Organizations: No    Attends Archivist Meetings: Never    Marital Status: Widowed    Tobacco Counseling Ready to quit: Not Answered Counseling given: Not Answered   Clinical Intake:  Pre-visit preparation completed: Yes  Pain : 0-10 Pain Score: 5  Pain Type: Chronic pain Pain Location: Back Pain Orientation: Lower Pain Descriptors / Indicators: Aching, Sharp Pain Onset: More than a month ago Pain Frequency: Constant     Nutritional Status: BMI 25 -29 Overweight Nutritional Risks: Nausea/ vomitting/ diarrhea (diarrhea sometimes when eat out) Diabetes: No  How often do you need to have someone help you when you read instructions, pamphlets, or other written materials from your doctor or pharmacy?: 1 - Never  Diabetic? no  Interpreter Needed?: No  Information entered by :: NAllen LPN   Activities of Daily  Living    11/03/2021   11:43 AM  In your present state of health, do you have any difficulty performing the following activities:  Hearing? 1  Comment tinnitus  Vision? 0  Difficulty concentrating or making decisions? 1  Walking or climbing stairs? 1  Dressing or bathing? 0  Doing errands, shopping? 0  Preparing Food  and eating ? N  Using the Toilet? N  In the past six months, have you accidently leaked urine? Y  Comment a little  Do you have problems with loss of bowel control? N  Managing your Medications? N  Managing your Finances? N  Housekeeping or managing your Housekeeping? N    Patient Care Team: [name], [name], MD as PCP - General (Family Medicine) Donato Heinz, MD as PCP - Cardiology (Cardiology) Gatha Mayer, MD as Consulting Physician (Gastroenterology) Shawna Orleans, Doe-Hyun R, DO (Inactive) Viona Gilmore, [facility] as Pharmacist (Pharmacist)  Indicate any recent Medical Services you may have received from other than [facility] providers in the past year (date may be approximate).     Assessment:   This is a routine wellness examination for [place].  Hearing/Vision screen Vision Screening - Comments:: Regular eye exams,   Dietary issues and exercise activities discussed: Current Exercise Habits: The patient does not participate in regular exercise at present   Goals Addressed             This Visit's Progress    Patient Stated       11/03/2021, no goals       Depression Screen    11/03/2021   11:41 AM 10/13/2021    2:22 PM 02/14/2021    9:59 AM 05/08/2020    2:30 PM 10/16/2019   10:51 AM 10/14/2018   10:45 PM 05/15/2015    8:47 AM  PHQ 2/9 Scores  PHQ - 2 Score 3 4 4 2 4 2 6   PHQ- 9 Score 4 11 12 6 9 8 18     Fall Risk    11/03/2021   11:39 AM 10/13/2021    2:22 PM 02/14/2021   10:00 AM 05/08/2020    2:29 PM 10/16/2019   10:00 AM  Fall Risk   Falls in the past year? 1 0 1 0 0  Comment got dizzy      Number falls in past yr: 0 0 1 0   Injury with Fall? 0 0  0   Risk for fall due to : Impaired balance/gait;Medication side effect Other (Comment)  No Fall Risks   Follow up Falls evaluation completed;Education provided;Falls prevention discussed Falls evaluation completed  Falls evaluation completed     FALL RISK PREVENTION PERTAINING TO THE HOME:  Any stairs in or  around the home? No  If so, are there any without handrails? N/a Home free of loose throw rugs in walkways, pet beds, electrical cords, etc? Yes  Adequate lighting in your home to reduce risk of falls? Yes   ASSISTIVE DEVICES UTILIZED TO PREVENT FALLS:  Life alert? No  Use of a cane, walker or w/c? No  Grab bars in the bathroom? No  Shower chair or bench in shower? Yes  Elevated toilet seat or a handicapped toilet? Yes   TIMED UP AND GO:  Was the test performed? No .      Cognitive Function:        11/03/2021   11:46 AM  6CIT Screen  What Year? 0 points  What month? 0 points  What time? 0  points  Count back from 20 0 points  Months in reverse 4 points  Repeat phrase 2 points  Total Score 6 points    Immunizations Immunization History  Administered Date(s) Administered   PFIZER(Purple Top)SARS-COV-2 Vaccination 11/18/2019, 12/14/2019   Pneumococcal Conjugate-13 02/06/2013   Pneumococcal Polysaccharide-23 08/01/2012   Td 04/02/2009, 09/25/2011   Tdap 09/09/2017   Zoster, Live 02/06/2013    TDAP status: Up to date  Flu Vaccine status: Declined, Education has been provided regarding the importance of this vaccine but patient still declined. Advised may receive this vaccine at local pharmacy or Health Dept. Aware to provide a copy of the vaccination record if obtained from local pharmacy or Health Dept. Verbalized acceptance and understanding.  Pneumococcal vaccine status: Up to date  Covid-19 vaccine status: Completed vaccines  Qualifies for Shingles Vaccine? Yes   Zostavax completed Yes   Shingrix Completed?: No.    Education has been provided regarding the importance of this vaccine. Patient has been advised to call insurance company to determine out of pocket expense if they have not yet received this vaccine. Advised may also receive vaccine at local pharmacy or Health Dept. Verbalized acceptance and understanding.  Screening Tests Health Maintenance  Topic  Date Due   COVID-19 Vaccine (3 - Pfizer risk series) 01/11/2020   Zoster Vaccines- Shingrix (1 of 2) 03/21/2026 (Originally 03/12/1960)   TETANUS/TDAP  09/10/2027   Pneumonia Vaccine 18+ Years old  Completed   HPV VACCINES  Aged Out   INFLUENZA VACCINE  Discontinued   COLONOSCOPY (Pts 45-73yrs Insurance coverage will need to be confirmed)  Discontinued    Health Maintenance  Health Maintenance Due  Topic Date Due   COVID-19 Vaccine (3 - Pfizer risk series) 01/11/2020    Colorectal cancer screening: No longer required.   Lung Cancer Screening: (Low Dose CT Chest recommended if Age 49-80 years, 30 pack-year currently smoking OR have quit w/in 15years.) does qualify.   Lung Cancer Screening Referral: CT scan 03/18/2021  Additional Screening:  Hepatitis C Screening: does not qualify;  Vision Screening: Recommended annual ophthalmology exams for early detection of glaucoma and other disorders of the eye. Is the patient up to date with their annual eye exam?  Yes  Who is the provider or what is the name of the office in which the patient attends annual eye exams? Can't remember name If pt is not established with a provider, would they like to be referred to a provider to establish care? No .   Dental Screening: Recommended annual dental exams for proper oral hygiene  Community Resource Referral / Chronic Care Management: CRR required this visit?  No   CCM required this visit?  No      Plan:     I have personally reviewed and noted the following in the patient's chart:   Medical and social history Use of alcohol, tobacco or illicit drugs  Current medications and supplements including opioid prescriptions. Patient is not currently taking opioid prescriptions. Functional ability and status Nutritional status Physical activity Advanced directives List of other physicians Hospitalizations, surgeries, and ER visits in previous 12 months Vitals Screenings to include  cognitive, depression, and falls Referrals and appointments  In addition, I have reviewed and discussed with patient certain preventive protocols, quality metrics, and best practice recommendations. A written personalized care plan for preventive services as well as general preventive health recommendations were provided to patient.     [name], LPN   06/23/7351   Nurse Notes:  none  Due to this being a virtual visit, the after visit summary with patients personalized plan was offered to patient via mail or my-chart. to pick up at office at next visit

## 2021-11-03 NOTE — Patient Instructions (Signed)
Timothy Wood , Thank you for taking time to come for your Medicare Wellness Visit. I appreciate your ongoing commitment to your health goals. Please review the following plan we discussed and let me know if I can assist you in the future.   Screening recommendations/referrals: Colonoscopy: not required Recommended yearly ophthalmology/optometry visit for glaucoma screening and checkup Recommended yearly dental visit for hygiene and checkup  Vaccinations: Influenza vaccine: decline Pneumococcal vaccine: completed 02/06/2013 Tdap vaccine: completed 09/09/2017, due 09/10/2027 Shingles vaccine: discussed   Covid-19:  12/14/2019, 11/18/2019  Advanced directives: Please bring a copy of your POA (Power of Attorney) and/or Living Will to your next appointment.   Conditions/risks identified: smoking  Next appointment: Follow up in one year for your annual wellness visit.   Preventive Care 81 Years and Older, Male Preventive care refers to lifestyle choices and visits with your health care provider that can promote health and wellness. What does preventive care include? A yearly physical exam. This is also called an annual well check. Dental exams once or twice a year. Routine eye exams. Ask your health care provider how often you should have your eyes checked. Personal lifestyle choices, including: Daily care of your teeth and gums. Regular physical activity. Eating a healthy diet. Avoiding tobacco and drug use. Limiting alcohol use. Practicing safe sex. Taking low doses of aspirin every day. Taking vitamin and mineral supplements as recommended by your health care provider. What happens during an annual well check? The services and screenings done by your health care provider during your annual well check will depend on your age, overall health, lifestyle risk factors, and family history of disease. Counseling  Your health care provider may ask you questions about your: Alcohol  use. Tobacco use. Drug use. Emotional well-being. Home and relationship well-being. Sexual activity. Eating habits. History of falls. Memory and ability to understand (cognition). Work and work Statistician. Screening  You may have the following tests or measurements: Height, weight, and BMI. Blood pressure. Lipid and cholesterol levels. These may be checked every 5 years, or more frequently if you are over 30 years old. Skin check. Lung cancer screening. You may have this screening every year starting at age 52 if you have a 30-pack-year history of smoking and currently smoke or have quit within the past 15 years. Fecal occult blood test (FOBT) of the stool. You may have this test every year starting at age 101. Flexible sigmoidoscopy or colonoscopy. You may have a sigmoidoscopy every 5 years or a colonoscopy every 10 years starting at age 57. Prostate cancer screening. Recommendations will vary depending on your family history and other risks. Hepatitis C blood test. Hepatitis B blood test. Sexually transmitted disease (STD) testing. Diabetes screening. This is done by checking your blood sugar (glucose) after you have not eaten for a while (fasting). You may have this done every 1-3 years. Abdominal aortic aneurysm (AAA) screening. You may need this if you are a current or former smoker. Osteoporosis. You may be screened starting at age 23 if you are at high risk. Talk with your health care provider about your test results, treatment options, and if necessary, the need for more tests. Vaccines  Your health care provider may recommend certain vaccines, such as: Influenza vaccine. This is recommended every year. Tetanus, diphtheria, and acellular pertussis (Tdap, Td) vaccine. You may need a Td booster every 10 years. Zoster vaccine. You may need this after age 38. Pneumococcal 13-valent conjugate (PCV13) vaccine. One dose is recommended after age  65. Pneumococcal polysaccharide  (PPSV23) vaccine. One dose is recommended after age 34. Talk to your health care provider about which screenings and vaccines you need and how often you need them. This information is not intended to replace advice given to you by your health care provider. Make sure you discuss any questions you have with your health care provider. Document Released: 03/29/2015 Document Revised: 11/20/2015 Document Reviewed: 01/01/2015 Elsevier Interactive Patient Education  2017 [place] Prevention in the Home Falls can cause injuries. They can happen to people of all ages. There are many things you can do to make your home safe and to help prevent falls. What can I do on the outside of my home? Regularly fix the edges of walkways and driveways and fix any cracks. Remove anything that might make you trip as you walk through a door, such as a raised step or threshold. Trim any bushes or trees on the path to your home. Use bright outdoor lighting. Clear any walking paths of anything that might make someone trip, such as rocks or tools. Regularly check to see if handrails are loose or broken. Make sure that both sides of any steps have handrails. Any raised decks and porches should have guardrails on the edges. Have any leaves, snow, or ice cleared regularly. Use sand or salt on walking paths during winter. Clean up any spills in your garage right away. This includes oil or grease spills. What can I do in the bathroom? Use night lights. Install grab bars by the toilet and in the tub and shower. Do not use towel bars as grab bars. Use non-skid mats or decals in the tub or shower. If you need to sit down in the shower, use a plastic, non-slip stool. Keep the floor dry. Clean up any water that spills on the floor as soon as it happens. Remove soap buildup in the tub or shower regularly. Attach bath mats securely with double-sided non-slip rug tape. Do not have throw rugs and other things on the  floor that can make you trip. What can I do in the bedroom? Use night lights. Make sure that you have a light by your bed that is easy to reach. Do not use any sheets or blankets that are too big for your bed. They should not hang down onto the floor. Have a firm chair that has side arms. You can use this for support while you get dressed. Do not have throw rugs and other things on the floor that can make you trip. What can I do in the kitchen? Clean up any spills right away. Avoid walking on wet floors. Keep items that you use a lot in easy-to-reach places. If you need to reach something above you, use a strong step stool that has a grab bar. Keep electrical cords out of the way. Do not use floor polish or wax that makes floors slippery. If you must use wax, use non-skid floor wax. Do not have throw rugs and other things on the floor that can make you trip. What can I do with my stairs? Do not leave any items on the stairs. Make sure that there are handrails on both sides of the stairs and use them. Fix handrails that are broken or loose. Make sure that handrails are as long as the stairways. Check any carpeting to make sure that it is firmly attached to the stairs. Fix any carpet that is loose or worn. Avoid having throw rugs at  the top or bottom of the stairs. If you do have throw rugs, attach them to the floor with carpet tape. Make sure that you have a light switch at the top of the stairs and the bottom of the stairs. If you do not have them, ask someone to add them for you. What else can I do to help prevent falls? Wear shoes that: Do not have high heels. Have rubber bottoms. Are comfortable and fit you well. Are closed at the toe. Do not wear sandals. If you use a stepladder: Make sure that it is fully opened. Do not climb a closed stepladder. Make sure that both sides of the stepladder are locked into place. Ask someone to hold it for you, if possible. Clearly mark and make  sure that you can see: Any grab bars or handrails. First and last steps. Where the edge of each step is. Use tools that help you move around (mobility aids) if they are needed. These include: Canes. Walkers. Scooters. Crutches. Turn on the lights when you go into a dark area. Replace any light bulbs as soon as they burn out. Set up your furniture so you have a clear path. Avoid moving your furniture around. If any of your floors are uneven, fix them. If there are any pets around you, be aware of where they are. Review your medicines with your doctor. Some medicines can make you feel dizzy. This can increase your chance of falling. Ask your doctor what other things that you can do to help prevent falls. This information is not intended to replace advice given to you by your health care provider. Make sure you discuss any questions you have with your health care provider. Document Released: 12/27/2008 Document Revised: 08/08/2015 Document Reviewed: 04/06/2014 Elsevier Interactive Patient Education  2017 [employer].

## 2021-11-20 ENCOUNTER — Telehealth: Payer: Self-pay | Admitting: Pharmacist

## 2021-11-20 NOTE — Telephone Encounter (Signed)
Called patient as I have not heard back from him with filling out the Xarelto PAP. He was unavailable and spoke with his daughter. She reported he is still taking the Eliquis because he wanted to finish it up and will work on getting him to take it twice a day to finish up and switch to Xarelto. Provided email address for patient assistance application requirement of pharmacy print out. Will follow up again in 2-3 weeks if I do not hear back.

## 2021-12-04 ENCOUNTER — Other Ambulatory Visit: Payer: Self-pay | Admitting: Family Medicine

## 2021-12-04 DIAGNOSIS — K219 Gastro-esophageal reflux disease without esophagitis: Secondary | ICD-10-CM

## 2021-12-13 ENCOUNTER — Other Ambulatory Visit: Payer: Self-pay | Admitting: Family Medicine

## 2021-12-13 DIAGNOSIS — I10 Essential (primary) hypertension: Secondary | ICD-10-CM

## 2022-01-08 NOTE — Progress Notes (Deleted)
Cardiology Office Note:    Date:  01/08/2022   ID:  Timothy Wood, DOB 03/11/1941, MRN [id number]  PCP:  [name], [name], [facility] Cardiologist:  Donato Heinz, MD { Click to update primary MD,subspecialty MD or APP then REFRESH:1}    Referring MD: [name], [name], MD   No chief complaint on file. ***  History of Present Illness:    Timothy Wood is a 81 y.o. male with a hx of ***    Past Medical History:  Diagnosis Date   Adenocarcinoma of lung, stage 1 ([place])    Anemia    Angiodysplasia of cecum 11/23/2017   Multiple 1-2 mm   Arthritis    back possibly   Blood clotting disorder (HCC)    Cataract    removed bilaterally    Chronic obstructive pulmonary disease (COPD) ([place])    "patient not aware"   Clotting disorder ([place])    PE LUNG, dvt leg    Colon polyps    Depression    Diverticulosis    DVT (deep venous thrombosis) ([place])    Post op  knee surgery   DVT (deep venous thrombosis) (HCC)    GERD (gastroesophageal reflux disease)    Histoplasmosis    Histoplasmosis with pneumonia ([place]) 04/01/2015   Hypercholesteremia    Hyperlipidemia    Hypertension    Laceration of head 08/05/2015   Lumbar compression fracture ([place])    Lung cancer ([place]) dx'd 2016   Personal history of colonic polyps 2008   multiple adenomas 2008-2009 exams with large cecal adenoma   Pneumonia    in history   Polyposis coli - attenuated 09/12/2010   2008: adenomas with 9 polyps index and 2.5 cm cecal polyp removed over 3 exams, last 2009 with APC. diverticulosis also. 11/24/2010: 12 polyps removed - adenomas Anticipate routine repeat colonoscopy Spring 2013 Have recommended he see genetics counselor re ? Polyposis 03/27/2016 14 polyps removed max 15 mm    Pulmonary nodule 06/2006   9 mm right upper obe pulmonary nodule (negative Pet Scan)   PVD (peripheral vascular disease) (HCC)    left leg s/p stent   Sebaceous cyst    neck   Seborrheic keratosis     Seizures ([place])    4-5 yrs ago had seizures when in ICU- none since this time    Sinusitis    Tobacco abuse    Tremor     Past Surgical History:  Procedure Laterality Date   ANGIOPLASTY / STENTING ILIAC  08/2008   Dr. Gray Bernhardt SURGERY  1975, 1978   x2 lumbar fusion over 30 years ago   BACK SURGERY     COLONOSCOPY  05/11/2006; 07/27/2006;05/05/2007, 11/24/2010   adenomas with 9 polyps index and 2.5 cm cecal polyp removed over 3 exams, last 2009 with APC. diverticulosis also.  2012: 12 adenomas removed, largest 8-10 mm, diverticulosis   CORONARY ANGIOPLASTY WITH STENT PLACEMENT     DESCENDING AORTIC ANEURYSM REPAIR W/ STENT  04/2010   Dr. Trula Slade   ESOPHAGOGASTRODUODENOSCOPY  05/05/2007   GERD - one erosion   EYE SURGERY Bilateral    cataract   KNEE SURGERY  2002   left- cartilage    LUNG BIOPSY     PERCUTANEOUS PINNING Left 06/24/2012   Procedure: Repair Complex Laceration Left Thumb with percutaneous pinning.;  Surgeon: Dennie Bible, MD;  Location: [facility];  Service: Plastics;  Laterality: Left;   THUMB AMPUTATION  Left    pinning and index finger   TONSILLECTOMY  1956   TONSILLECTOMY     UPPER GASTROINTESTINAL ENDOSCOPY     VIDEO ASSISTED THORACOSCOPY (VATS)/WEDGE RESECTION Right 01/24/2015   Procedure: RIGHT VIDEO ASSISTED THORACOSCOPY (VATS)/WEDGE RESECTION;  Surgeon: [name], MD;  Location: [facility];  Service: Thoracic;  Laterality: Right;   vocal cord polyps removed      Current Medications: No outpatient medications have been marked as taking for the 01/09/22 encounter (Appointment) with Deberah Pelton, NP.     Allergies:   Codeine and Hydrocodone   Social History   Socioeconomic History   Marital status: Widowed    Spouse name: Not on file   Number of children: 2   Years of education: Not on file   Highest education level: Not on file  Occupational History   Occupation: Retired    Fish farm manager: RETIRED  Tobacco Use   Smoking status: Every Day     Packs/day: 1.00    Years: 60.00    Total pack years: 60.00    Types: Cigarettes   Smokeless tobacco: Never  Vaping Use   Vaping Use: Never used  Substance and Sexual Activity   Alcohol use: Yes    Comment: beer on occasion   Drug use: No   Sexual activity: Not Currently  Other Topics Concern   Not on file  Social History Narrative       Married for over 50 years.  Wife died from lung cancer.     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     Occasional alcohol.      Tobacco use - over 50 pack years.   Father died at age 12 of suicide.   Selinda Eon Ellis-daughter emergenc   y contact   Social Determinants of Health   Financial Resource Strain: Low Risk  (11/03/2021)   Overall Financial Resource Strain (CARDIA)    Difficulty of Paying Living Expenses: Not hard at all  Food Insecurity: No Food Insecurity (11/03/2021)   Hunger Vital Sign    Worried About Running Out of Food in the Last Year: Never true    Ran Out of Food in the Last Year: Never true  Transportation Needs: No Transportation Needs (11/03/2021)   PRAPARE - Hydrologist (Medical): No    Lack of Transportation (Non-Medical): No  Physical Activity: Inactive (11/03/2021)   Exercise Vital Sign    Days of Exercise per Week: 0 days    Minutes of Exercise per Session: 0 min  Stress: Stress Concern Present (11/03/2021)   [place]    Feeling of Stress : To some extent  Social Connections: Socially Isolated (05/08/2020)   Social Connection and Isolation Panel [NHANES]    Frequency of Communication with Friends and Family: More than three times a week    Frequency of Social Gatherings with Friends and Family: More than three times a week    Attends Religious Services: Never    Marine scientist or Organizations: No    Attends Archivist Meetings: Never    Marital Status: Widowed     Family  History: The patient's ***family history includes Cancer (age of onset: 94) in his mother; Colon cancer (age of onset: 59) in his mother; Deep vein thrombosis in his mother; Diabetes in an other family member; Heart disease in his brother and brother; Lung cancer  in his mother and son; Rheum arthritis in his daughter and sister; Uterine cancer in his mother. There is no history of Colon polyps, Stomach cancer, or Rectal cancer.  ROS:   Please see the history of present illness.    *** All other systems reviewed and are negative.  EKGs/Labs/Other Studies Reviewed:    The following studies were reviewed today: ***  EKG:  EKG is *** ordered today.  The ekg ordered today demonstrates ***  Recent Labs: 03/18/2021: ALT 11; BUN 13; Creatinine 1.05; Hemoglobin 15.8; Platelet Count 176; Potassium 3.6; Sodium 138  Recent Lipid Panel    Component Value Date/Time   CHOL 143 10/13/2021 1503   CHOL 135 03/14/2020 1407   TRIG 220.0 (H) 10/13/2021 1503   HDL 40.70 10/13/2021 1503   HDL 38 (L) 03/14/2020 1407   CHOLHDL 4 10/13/2021 1503   VLDL 44.0 (H) 10/13/2021 1503   LDLCALC 59 03/14/2020 1407   LDLDIRECT 79.0 10/13/2021 1503     Risk Assessment/Calculations:   {Does this patient have ATRIAL FIBRILLATION?:[id number]}  No BP recorded.  {Refresh Note OR Click here to enter BP  :1}***         Physical Exam:    VS:  There were no vitals taken for this visit.    Wt Readings from Last 3 Encounters:  11/03/21 170 lb (77.1 kg)  10/13/21 172 lb 4 oz (78.1 kg)  03/24/21 168 lb 6.4 oz (76.4 kg)     GEN: *** Well nourished, well developed in no acute distress HEENT: Normal NECK: No JVD; No carotid bruits LYMPHATICS: No lymphadenopathy CARDIAC: ***RRR, no murmurs, rubs, gallops RESPIRATORY:  Clear to auscultation without rales, wheezing or rhonchi  ABDOMEN: Soft, non-tender, non-distended MUSCULOSKELETAL:  No edema; No deformity  SKIN: Warm and dry NEUROLOGIC:  Alert and oriented x  3 PSYCHIATRIC:  Normal affect   ASSESSMENT:    No diagnosis found. PLAN:    In order of problems listed above:  ***      {Are you ordering a CV Procedure (e.g. stress test, cath, DCCV, TEE, etc)?   Press F2        :[id number]}    Medication Adjustments/Labs and Tests Ordered: Current medicines are reviewed at length with the patient today.  Concerns regarding medicines are outlined above.  No orders of the defined types were placed in this encounter.  No orders of the defined types were placed in this encounter.   There are no Patient Instructions on file for this visit.   Signed, Finis Bud, NP  01/08/2022 2:57 PM    [place]

## 2022-01-09 ENCOUNTER — Ambulatory Visit: Payer: Medicare HMO | Admitting: General Practice

## 2022-01-12 ENCOUNTER — Other Ambulatory Visit: Payer: Self-pay | Admitting: Cardiology

## 2022-01-13 NOTE — Progress Notes (Unsigned)
Cardiology Clinic Note   Patient Name: Timothy Wood Date of Encounter: 01/15/2022  Primary Care Provider:  [name], [name], MD Primary Cardiologist:  Timothy Heinz, MD  Patient Profile    [name] is an 81 year-old male with a past medical history of DVT/PE on Xarelto, PAD s/p iliac stenting in 2010, AAA s/p repair 20212, hypertension, hyperlipidemia, tobacco use, COPD, lung cancer, adenomatous colon polyps, and seizures who presents to the clinic today for follow-up of chronic cardiac conditions.   Past Medical History    Past Medical History:  Diagnosis Date   Adenocarcinoma of lung, stage 1 (Timothy Wood)    Anemia    Angiodysplasia of cecum 11/23/2017   Multiple 1-2 mm   Arthritis    back possibly   Blood clotting disorder (HCC)    Cataract    removed bilaterally    Chronic obstructive pulmonary disease (COPD) ([place])    "patient not aware"   Clotting disorder ([place])    PE LUNG, dvt leg    Colon polyps    Depression    Diverticulosis    DVT (deep venous thrombosis) ([place])    Post op  knee surgery   DVT (deep venous thrombosis) (HCC)    GERD (gastroesophageal reflux disease)    Histoplasmosis    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 diverticulosis also. 11/24/2010: 12 polyps removed - adenomas Anticipate routine repeat colonoscopy Spring 2013 Have recommended he see genetics counselor re ? Polyposis 03/27/2016 14 polyps removed max 15 mm    Pulmonary nodule 06/2006   9 mm right upper obe pulmonary nodule (negative Pet Scan)   PVD  (peripheral vascular disease) (HCC)    left leg s/p stent   Sebaceous cyst    neck   Seborrheic keratosis    Seizures ([place])    4-5 yrs ago had seizures when in ICU- none since this time    Sinusitis    Tobacco abuse    Tremor    Past Surgical History:  Procedure Laterality Date   ANGIOPLASTY / STENTING ILIAC  08/2008   Dr. Gray Wood SURGERY  1975, 1978   x2 lumbar fusion over 30 years ago   BACK SURGERY     COLONOSCOPY  05/11/2006; 07/27/2006;05/05/2007, 11/24/2010   adenomas with 9 polyps index and 2.5 cm cecal polyp removed over 3 exams, last 2009 with APC. diverticulosis also.  2012: 12 adenomas removed, largest 8-10 mm, diverticulosis   CORONARY ANGIOPLASTY WITH STENT PLACEMENT     DESCENDING AORTIC ANEURYSM REPAIR W/ STENT  04/2010   Dr. Trula Wood   ESOPHAGOGASTRODUODENOSCOPY  05/05/2007   GERD - one erosion   EYE SURGERY Bilateral    cataract   KNEE SURGERY  2002   left- cartilage    LUNG BIOPSY     PERCUTANEOUS PINNING Left 06/24/2012   Procedure: Repair Complex Laceration Left Thumb with percutaneous pinning.;  Surgeon: Dennie Bible, MD;  Location: [facility];  Service: Plastics;  Laterality: Left;  THUMB AMPUTATION Left    pinning and index finger   TONSILLECTOMY  1956   TONSILLECTOMY     UPPER GASTROINTESTINAL ENDOSCOPY     VIDEO ASSISTED THORACOSCOPY (VATS)/WEDGE RESECTION Right 01/24/2015   Procedure: RIGHT VIDEO ASSISTED THORACOSCOPY (VATS)/WEDGE RESECTION;  Surgeon: [name], MD;  Location: [facility];  Service: Thoracic;  Laterality: Right;   vocal cord polyps removed      Allergies  Allergies  Allergen Reactions   Codeine Anaphylaxis   Hydrocodone Itching    History of Present Illness    [name] has a past medical history of: DVT/PE. Changed from Eliquis to Xarelto by his PCP in July 2023 secondary to frequently missing second dose of Eliquis. PAD. Status post iliac stenting in 2010. ABIs 09/06/2020: Resting right and left ankle-brachial  index is within normal range.  The right and left toe-brachial index is abnormal.  No evidence of significant bilateral lower extremity arterial disease. Vascular ultrasound aorta/IVC/iliacs 09/06/2020: Arterio-iliac atherosclerosis.  Elevated but stable velocities in the left common iliac artery stent. AAA. Status post repair 2012. Hypertension.  Hyperlipidemia.  Last lipid panel 10/13/2021: LDL 79, HDL 40.7, triglycerides 220, total cholesterol 143.  Managed by PCP. COPD. Lung cancer.  Tobacco use. Smokes 0.5-1.5 packs a day.  Last seen in the office by Dr. Gardiner Wood on 08/16/2020. A month prior to the visit he was hospitalized in [place] for acute hypoxic respiratory failure secondary to bilateral pneumonia. He reported he had decreased smoking to 10-15 cigarettes per day at that time. Medications were continued.   Today, patient states he is doing well.  He reports his PCP wanted him to be seen because EKG in July 2023 showed PVCs and he was noted to have extrasystoles.  Patient denies palpitations.  He does report very brief episodes of mild nonradiating chest pain that lasts 1 to 2 minutes and resolves on its own.  Pain is not associated with shortness of breath.  These episodes typically happen while he is sitting down and occur as infrequently as 1-2 times a month and sometimes not at all.  He does not have any chest pain while exerting himself.  He does not follow an exercise routine but stays active with household activities such as vacuuming and cooking.  He reports bilateral lower extremity edema that is "there all the time."  He denies lower extremity pain while walking.  Patient's blood pressure is high today.  He does not check it at home.  He admits that he will frequently forget to take his medication sometimes 2 to 3 days in a row.  He states he did take his medication today and feels his blood pressure is high secondary to getting stressed while here.    Patient lives alone.  He has  a daughter who checks on him once a week.  Home Medications    Current Meds  Medication Sig   DULoxetine (CYMBALTA) 30 MG capsule TAKE ONE CAPSULE BY MOUTH DAILY   EPINEPHrine (EPIPEN 2-PAK) 0.3 mg/0.3 mL IJ SOAJ injection To use daily if needed for severe allergy reaction.   furosemide (LASIX) 20 MG tablet Take 1 tablet (20 mg total) by mouth daily.   irbesartan (AVAPRO) 150 MG tablet TAKE ONE TABLET BY MOUTH DAILY   omeprazole (PRILOSEC) 20 MG capsule TAKE ONE CAPSULE BY MOUTH TWICE A DAY BEFORE MEALS (Patient taking differently: Take 20 mg by mouth daily.)   potassium chloride (KLOR-CON) 10 MEQ tablet Take 1 tablet (10 mEq total) by  mouth daily.   rivaroxaban (XARELTO) 10 MG TABS tablet Take 1 tablet (10 mg total) by mouth daily.   rosuvastatin (CRESTOR) 20 MG tablet TAKE ONE TABLET BY MOUTH DAILY   tiZANidine (ZANAFLEX) 2 MG tablet TAKE ONE TABLET BY MOUTH TWICE A DAY AS NEEDED   [DISCONTINUED] amLODipine (NORVASC) 5 MG tablet Take 1 tablet (5 mg total) by mouth daily. Please keep scheduled appointment for future refills. Thank you.    Family History    Family History  Problem Relation Age of Onset   Colon cancer Mother 24       multiple colon polyps   Deep vein thrombosis Mother    Cancer Mother 66       uterine cancer   Uterine cancer Mother    Lung cancer Mother    Lung cancer Son        smoked and was a welder   Heart disease Brother    Diabetes Other    Heart disease Brother    Rheum arthritis Daughter    Rheum arthritis Sister    Colon polyps Neg Hx    Stomach cancer Neg Hx    Rectal cancer Neg Hx    He indicated that his mother is deceased. He indicated that his father is deceased. He indicated that the status of his sister is unknown. He indicated that the status of his daughter is unknown. He indicated that his son is deceased. He indicated that the status of his neg hx is unknown. He indicated that the status of his other is unknown.   Social History     Social History   Socioeconomic History   Marital status: Widowed    Spouse name: Not on file   Number of children: 2   Years of education: Not on file   Highest education level: Not on file  Occupational History   Occupation: Retired    Fish farm manager: RETIRED  Tobacco Use   Smoking status: Every Day    Packs/day: 1.00    Years: 60.00    Total pack years: 60.00    Types: Cigarettes   Smokeless tobacco: Never  Vaping Use   Vaping Use: Never used  Substance and Sexual Activity   Alcohol use: Yes    Comment: beer on occasion   Drug use: No   Sexual activity: Not Currently  Other Topics Concern   Not on file  Social History Narrative       Married for over 50 years.  Wife died from lung cancer.     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     Occasional alcohol.      Tobacco use - over 50 pack years.   Father died at age 43 of suicide.   Selinda Eon Ellis-daughter emergenc   y contact   Social Determinants of Health   Financial Resource Strain: Low Risk  (11/03/2021)   Overall Financial Resource Strain (CARDIA)    Difficulty of Paying Living Expenses: Not hard at all  Food Insecurity: No Food Insecurity (11/03/2021)   Hunger Vital Sign    Worried About Running Out of Food in the Last Year: Never true    Ran Out of Food in the Last Year: Never true  Transportation Needs: No Transportation Needs (11/03/2021)   PRAPARE - Hydrologist (Medical): No    Lack of Transportation (Non-Medical): No  Physical Activity: Inactive (11/03/2021)   Exercise Vital Sign  Days of Exercise per Week: 0 days    Minutes of Exercise per Session: 0 min  Stress: Stress Concern Present (11/03/2021)   [place]    Feeling of Stress : To some extent  Social Connections: Socially Isolated (05/08/2020)   Social Connection and Isolation Panel [NHANES]    Frequency of Communication with  Friends and Family: More than three times a week    Frequency of Social Gatherings with Friends and Family: More than three times a week    Attends Religious Services: Never    Marine scientist or Organizations: No    Attends Archivist Meetings: Never    Marital Status: Widowed  Intimate Partner Violence: Not At Risk (05/08/2020)   Humiliation, Afraid, Rape, and Kick questionnaire    Fear of Current or Ex-Partner: No    Emotionally Abused: No    Physically Abused: No    Sexually Abused: No     Review of Systems    General: No chills, fever, night sweats or weight changes.  Cardiovascular:  No dyspnea on exertion, orthopnea, palpitations, paroxysmal nocturnal dyspnea.  Occasional mild chest pain. Bilateral lower extremity edema.  Dermatological: No rash, lesions/masses Respiratory: No cough, dyspnea Urologic: No hematuria, dysuria Abdominal:   No nausea, vomiting, diarrhea, bright red blood per rectum, melena, or hematemesis Neurologic:  No visual changes, weakness, changes in mental status. All other systems reviewed and are otherwise negative except as noted above.  Physical Exam    VS:  BP (!) 148/82 (BP Location: Right Arm, Patient Position: Sitting, Cuff Size: Normal)   Pulse 88   Ht 5\' 8"  (1.727 m)   Wt 178 lb (80.7 kg)   SpO2 95%   BMI 27.06 kg/m  , BMI Body mass index is 27.06 kg/m. GEN: Well nourished, well developed, in no acute distress. HEENT: Normal. Neck: Supple, no JVD, carotid bruits, or masses. Cardiac: RRR, no murmurs, rubs, or gallops. No clubbing, cyanosis.  Radials/DP/PT 2+ and equal bilaterally. 1+ pitting edema bilateral lower extremities.  Respiratory:  Respirations regular and unlabored, clear to auscultation bilaterally. GI: Soft, nontender, nondistended. MS: No deformity or atrophy. Skin: Warm and dry, no rash. Neuro: Strength and sensation are intact. Psych: Normal affect.  Accessory Clinical Findings    Recent  Labs: 03/18/2021: ALT 11; BUN 13; Creatinine 1.05; Hemoglobin 15.8; Platelet Count 176; Potassium 3.6; Sodium 138   Recent Lipid Panel    Component Value Date/Time   CHOL 143 10/13/2021 1503   CHOL 135 03/14/2020 1407   TRIG 220.0 (H) 10/13/2021 1503   HDL 40.70 10/13/2021 1503   HDL 38 (L) 03/14/2020 1407   CHOLHDL 4 10/13/2021 1503   VLDL 44.0 (H) 10/13/2021 1503   LDLCALC 59 03/14/2020 1407   LDLDIRECT 79.0 10/13/2021 1503    HYPERTENSION CONTROL Vitals:   01/15/22 0824 01/15/22 0931  BP: (!) 166/82 (!) 148/82    The patient's blood pressure is elevated above target today.  In order to address the patient's elevated BP: A new medication was prescribed today. (Counseled patient on importance of taking medications consistently.)    ECG personally reviewed by me today normal sinus rhythm, no ectopy, heart rate 72.  Unchanged from 10/13/2021.    Assessment & Plan   Lower extremity edema. 1+ pitting edema bilateral lower extremities. Patient reports edema is "always there." He does not recall ever taking diuretics in the past. Will prescribe Lasix 20 mg for three days to  be taken with potassium 10 meq for three days. BMP in 1 week.  Hypertension. Blood pressure is elevated at 166/82 at intake, 148/82 on repeat. Patient reports he took his medication today but admits he often forgets to take his medications, sometimes for 2-3 days in a row. Counseled patient on the importance of consistently taking medications. He does not have a cell phone to set an alarm. He states he keeps them at the kitchen table but still forgets to take them.  He lives alone.  His daughter comes once a week to check on him.  Refill needed for amlodipine. DVT/PE.  Patient was recently changed from [place] to [place] by his PCP in July 2023 secondary to patient frequently missing the second dose of Eliquis.  Again stressed the importance of consistently taking this medication to prevent stroke.  He denies  shortness of breath at rest or increased shortness of breath with activity.  Will get a CBC at his next visit to check blood counts. PAD.  Status post iliac stenting in 2010.  ABIs in June 2022 showed no evidence of significant bilateral lower extremity arterial disease.  Vascular ultrasound in June 2022 showed elevated but stable velocities of the left common iliac artery stent.  Patient denies claudication. COPD/lung cancer/tobacco use.  Patient continues to smoke 0.5 to 1.5 packs of cigarettes a day.  He rolls his own cigarettes.  He denies shortness of breath unless he is doing heavy exertion.  He is independent with all ADLs and household activities such as vacuuming and cooking.  Disposition: Lasix 20 mg daily and potassium 10 mEq daily for 3 days.  Refill amlodipine.  CBC and BMP in 1 week.  Return in 1 week with [place] or sooner as needed.   Justice Britain. Noelle Sease, NP-C     01/15/2022, 9:38 AM [facility] [address] Office [phone] Fax [phone]   I spent 15 minutes examining this patient, reviewing medications, and using patient centered shared decision making involving her cardiac care.  Prior to her visit I spent greater than 20 minutes reviewing her past medical history,  medications, and prior cardiac tests.

## 2022-01-15 ENCOUNTER — Ambulatory Visit: Payer: Medicare HMO | Attending: General Practice | Admitting: Student

## 2022-01-15 ENCOUNTER — Encounter: Payer: Self-pay | Admitting: General Practice

## 2022-01-15 VITALS — BP 148/82 | HR 88 | Ht 68.0 in | Wt 178.0 lb

## 2022-01-15 DIAGNOSIS — Z95828 Presence of other vascular implants and grafts: Secondary | ICD-10-CM | POA: Diagnosis not present

## 2022-01-15 DIAGNOSIS — I2699 Other pulmonary embolism without acute cor pulmonale: Secondary | ICD-10-CM | POA: Diagnosis not present

## 2022-01-15 DIAGNOSIS — I10 Essential (primary) hypertension: Secondary | ICD-10-CM | POA: Diagnosis not present

## 2022-01-15 DIAGNOSIS — R600 Localized edema: Secondary | ICD-10-CM | POA: Diagnosis not present

## 2022-01-15 DIAGNOSIS — Z720 Tobacco use: Secondary | ICD-10-CM

## 2022-01-15 DIAGNOSIS — I739 Peripheral vascular disease, unspecified: Secondary | ICD-10-CM | POA: Diagnosis not present

## 2022-01-15 DIAGNOSIS — J449 Chronic obstructive pulmonary disease, unspecified: Secondary | ICD-10-CM

## 2022-01-15 DIAGNOSIS — I82409 Acute embolism and thrombosis of unspecified deep veins of unspecified lower extremity: Secondary | ICD-10-CM | POA: Diagnosis not present

## 2022-01-15 MED ORDER — FUROSEMIDE 20 MG PO TABS: 20.0000 mg | ORAL_TABLET | Freq: Every day | ORAL | 0 refills | Status: DC

## 2022-01-15 MED ORDER — AMLODIPINE BESYLATE 5 MG PO TABS: 5.0000 mg | ORAL_TABLET | Freq: Every day | ORAL | 1 refills | Status: DC

## 2022-01-15 MED ORDER — POTASSIUM CHLORIDE ER 10 MEQ PO TBCR: 10.0000 meq | EXTENDED_RELEASE_TABLET | Freq: Every day | ORAL | 0 refills | Status: DC

## 2022-01-15 NOTE — Patient Instructions (Signed)
Medication Instructions:  TAKE FUROSEMIDE 20MG  x3DAYS THEN STOP  TAKE POTASSIUM 10 MEQ x3DAYS THEN STOP *If you need a refill on your cardiac medications before your next appointment, please call your pharmacy*  Lab Work: BMET [place] [facility] If you have labs (blood work) drawn today and your tests are completely normal, you will receive your results only by:  MyChart Message (if you have MyChart) OR A paper copy in the mail  If you have any lab test that is abnormal or we need to change your treatment, we will call you to review the results.  Other Instructions CALL WITH THE OTHER MEDICATION THAT YOU HAVE AT HOME  Follow-Up: At [facility], you and your health needs are our priority.  As part of our continuing mission to provide you with exceptional heart care, we have created designated Provider Care Teams.  These Care Teams include your primary Cardiologist (physician) and Advanced Practice Providers (APPs -  Physician Assistants and Nurse Practitioners) who all work together to provide you with the care you need, when you need it.  We recommend signing up for the patient portal called "MyChart".  Sign up information is provided on this After Visit Summary.  MyChart is used to connect with patients for Virtual Visits (Telemedicine).  Patients are able to view lab/test results, encounter notes, upcoming appointments, etc.  Non-urgent messages can be sent to your provider as well.   To learn more about what you can do with MyChart, go to [email or web address].    Your next appointment:   1 week(s)  The format for your next appointment:   In Person  Provider:   Coletta Memos, FNP-C   Important Information About Sugar

## 2022-01-18 NOTE — Progress Notes (Signed)
Cardiology Clinic Note   Patient Name: Timothy Wood Date of Encounter: 01/23/2022  Primary Care Provider:  [name], [name], MD Primary Cardiologist:  Donato Heinz, MD  Patient Profile    Timothy Wood 81 year old male presents the clinic today for follow-up evaluation of his lower extremity swelling.  Past Medical History    Past Medical History:  Diagnosis Date   Adenocarcinoma of lung, stage 1 ([place])    Anemia    Angiodysplasia of cecum 11/23/2017   Multiple 1-2 mm   Arthritis    back possibly   Blood clotting disorder (HCC)    Cataract    removed bilaterally    Chronic obstructive pulmonary disease (COPD) ([place])    "patient not aware"   Clotting disorder ([place])    PE LUNG, dvt leg    Colon polyps    Depression    Diverticulosis    DVT (deep venous thrombosis) ([place])    Post op  knee surgery   DVT (deep venous thrombosis) (HCC)    GERD (gastroesophageal reflux disease)    Histoplasmosis    Histoplasmosis with pneumonia ([place]) 04/01/2015   Hypercholesteremia    Hyperlipidemia    Hypertension    Laceration of head 08/05/2015   Lumbar compression fracture ([place])    Lung cancer ([place]) dx'd 2016   Personal history of colonic polyps 2008   multiple adenomas 2008-2009 exams with large cecal adenoma   Pneumonia    in history   Polyposis coli - attenuated 09/12/2010   2008: adenomas with 9 polyps index and 2.5 cm cecal polyp removed over 3 exams, last 2009 with APC. diverticulosis also. 11/24/2010: 12 polyps removed - adenomas Anticipate routine repeat colonoscopy Spring 2013 Have recommended he see genetics counselor re ? Polyposis 03/27/2016 14 polyps removed max 15 mm    Pulmonary nodule 06/2006   9 mm right upper obe pulmonary nodule (negative Pet Scan)   PVD (peripheral vascular disease) (HCC)    left leg s/p stent   Sebaceous cyst    neck   Seborrheic keratosis    Seizures ([place])    4-5 yrs ago had seizures when in ICU- none since this time     Sinusitis    Tobacco abuse    Tremor    Past Surgical History:  Procedure Laterality Date   ANGIOPLASTY / STENTING ILIAC  08/2008   Dr. Gray Bernhardt SURGERY  1975, 1978   x2 lumbar fusion over 30 years ago   BACK SURGERY     COLONOSCOPY  05/11/2006; 07/27/2006;05/05/2007, 11/24/2010   adenomas with 9 polyps index and 2.5 cm cecal polyp removed over 3 exams, last 2009 with APC. diverticulosis also.  2012: 12 adenomas removed, largest 8-10 mm, diverticulosis   CORONARY ANGIOPLASTY WITH STENT PLACEMENT     DESCENDING AORTIC ANEURYSM REPAIR W/ STENT  04/2010   Dr. Trula Slade   ESOPHAGOGASTRODUODENOSCOPY  05/05/2007   GERD - one erosion   EYE SURGERY Bilateral    cataract   KNEE SURGERY  2002   left- cartilage    LUNG BIOPSY     PERCUTANEOUS PINNING Left 06/24/2012   Procedure: Repair Complex Laceration Left Thumb with percutaneous pinning.;  Surgeon: Dennie Bible, MD;  Location: [facility];  Service: Plastics;  Laterality: Left;   THUMB AMPUTATION Left    pinning and index finger   TONSILLECTOMY  1956   TONSILLECTOMY     UPPER GASTROINTESTINAL ENDOSCOPY     VIDEO ASSISTED THORACOSCOPY (  VATS)/WEDGE RESECTION Right 01/24/2015   Procedure: RIGHT VIDEO ASSISTED THORACOSCOPY (VATS)/WEDGE RESECTION;  Surgeon: [name], MD;  Location: [facility];  Service: Thoracic;  Laterality: Right;   vocal cord polyps removed      Allergies  Allergies  Allergen Reactions   Codeine Anaphylaxis   Hydrocodone Itching    History of Present Illness    [name] has a PMH of DVT, PE present on Xarelto close PAD status post iliac stenting in 2010, AAA status postrepair HTN, HLD, tobacco use, COPD, lung CA, colon polyps, and lower extremity edema.  He is status post iliac stenting in 2010.  His ABIs 6/22 showed right and left ABI is within normal range.  His right and left-brachial index was abnormal.  There was no significant bilateral lower extremity arterial disease noted.  His vascular ultrasound  aorta/IVC/iliac 6/22 showed aorto-iliac atherosclerosis which remained stable.  He is status post AAA repair in 2012.  Lipid panel 7/23 showed an LDL of 79.  He was seen in follow-up by Dr. Gardiner Rhyme on 08/16/2020.  He reported an episode of acute hypoxic respiratory failure where he had been hospitalized in [place].  He was also noted to have bilateral pneumonia at that time.  He indicated that he had decreased his smoking to 10-15 cigarettes/day at that time.  He was seen in follow-up by [name], NP-C on 01/15/2022.  His blood pressure was noted to be elevated at 148/82.  He noted that he was not checking his blood pressure at home and that he frequently forgot to take his medication for several days in a row.  He is living alone and his daughter will check on him through the week.  He was prescribed furosemide 20 mg for 3 days and instructed to take potassium 10 mill equivalents for 3 days.  He presents to the clinic today for follow-up evaluation and states he feels well.  He does report a fall in his driveway this week.  He stepped on one of his cats and his hands were full.  We reviewed the importance of lower extremity support stockings, low-salt diet, and continuing to monitor his blood pressure.  His weight today has improved by 4 pounds.  He reports compliance with his medications.  He sporadically checks his blood pressure at home and reports labile blood pressures.  His blood pressure initially today is 158/89 and on recheck is 188 systolic.  I will start carvedilol 6.25 mg twice daily, order BMP today, give salty 6 diet sheet, give aspirin support stocking sheet and plan follow-up in 1 to 2 months.  He continues to smoke 10 to 30 cigarettes/day.  He is not interested in stopping at this time.  Today he denies chest pain, shortness of breath, lower extremity edema, fatigue, palpitations, melena, hematuria, hemoptysis, diaphoresis, weakness, presyncope, syncope, orthopnea, and  PND.   Home Medications    Prior to Admission medications   Medication Sig Start Date End Date Taking? Authorizing Provider  amLODipine (NORVASC) 5 MG tablet Take 1 tablet (5 mg total) by mouth daily. Please keep scheduled appointment for future refills. Thank you. 01/15/22   Deberah Pelton, NP  DULoxetine (CYMBALTA) 30 MG capsule TAKE ONE CAPSULE BY MOUTH DAILY 06/23/21   [name], [name], MD  EPINEPHrine (EPIPEN 2-PAK) 0.3 mg/0.3 mL IJ SOAJ injection To use daily if needed for severe allergy reaction. 08/20/21   [name], [name], MD  furosemide (LASIX) 20 MG tablet Take 1 tablet (20 mg total)  by mouth daily. 01/15/22 04/15/22  Deberah Pelton, NP  irbesartan (AVAPRO) 150 MG tablet TAKE ONE TABLET BY MOUTH DAILY 12/15/21   [name], [name], MD  omeprazole (PRILOSEC) 20 MG capsule TAKE ONE CAPSULE BY MOUTH TWICE A DAY BEFORE MEALS Patient taking differently: Take 20 mg by mouth daily. 12/04/21   [name], [name], MD  potassium chloride (KLOR-CON) 10 MEQ tablet Take 1 tablet (10 mEq total) by mouth daily. 01/15/22 04/15/22  Deberah Pelton, NP  rivaroxaban (XARELTO) 10 MG TABS tablet Take 1 tablet (10 mg total) by mouth daily. 10/13/21   [name], [name], MD  rosuvastatin (CRESTOR) 20 MG tablet TAKE ONE TABLET BY MOUTH DAILY 05/27/21   [name], [name], MD  tiZANidine (ZANAFLEX) 2 MG tablet TAKE ONE TABLET BY MOUTH TWICE A DAY AS NEEDED 02/14/21   [name], [name], MD    Family History    Family History  Problem Relation Age of Onset   Colon cancer Mother 24       multiple colon polyps   Deep vein thrombosis Mother    Cancer Mother 76       uterine cancer   Uterine cancer Mother    Lung cancer Mother    Lung cancer Son        smoked and was a welder   Heart disease Brother    Diabetes Other    Heart disease Brother    Rheum arthritis Daughter    Rheum arthritis Sister    Colon polyps Neg Hx    Stomach cancer Neg Hx    Rectal cancer Neg Hx    He indicated that his mother is deceased. He  indicated that his father is deceased. He indicated that the status of his sister is unknown. He indicated that the status of his daughter is unknown. He indicated that his son is deceased. He indicated that the status of his neg hx is unknown. He indicated that the status of his other is unknown.  Social History    Social History   Socioeconomic History   Marital status: Widowed    Spouse name: Not on file   Number of children: 2   Years of education: Not on file   Highest education level: Not on file  Occupational History   Occupation: Retired    Fish farm manager: RETIRED  Tobacco Use   Smoking status: Every Day    Packs/day: 1.00    Years: 60.00    Total pack years: 60.00    Types: Cigarettes   Smokeless tobacco: Never  Vaping Use   Vaping Use: Never used  Substance and Sexual Activity   Alcohol use: Yes    Comment: beer on occasion   Drug use: No   Sexual activity: Not Currently  Other Topics Concern   Not on file  Social History Narrative       Married for over 50 years.  Wife died from lung cancer.     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     Occasional alcohol.      Tobacco use - over 50 pack years.   Father died at age 34 of suicide.   Selinda Eon Ellis-daughter emergenc   y contact   Social Determinants of Health   Financial Resource Strain: Low Risk  (11/03/2021)   Overall Financial Resource Strain (CARDIA)    Difficulty of Paying Living Expenses: Not hard at all  Food Insecurity: No Food Insecurity (11/03/2021)  Hunger Vital Sign    Worried About Running Out of Food in the Last Year: Never true    Ran Out of Food in the Last Year: Never true  Transportation Needs: No Transportation Needs (11/03/2021)   PRAPARE - Hydrologist (Medical): No    Lack of Transportation (Non-Medical): No  Physical Activity: Inactive (11/03/2021)   Exercise Vital Sign    Days of Exercise per Week: 0 days    Minutes of Exercise per  Session: 0 min  Stress: Stress Concern Present (11/03/2021)   [place]    Feeling of Stress : To some extent  Social Connections: Socially Isolated (05/08/2020)   Social Connection and Isolation Panel [NHANES]    Frequency of Communication with Friends and Family: More than three times a week    Frequency of Social Gatherings with Friends and Family: More than three times a week    Attends Religious Services: Never    Marine scientist or Organizations: No    Attends Archivist Meetings: Never    Marital Status: Widowed  Intimate Partner Violence: Not At Risk (05/08/2020)   Humiliation, Afraid, Rape, and Kick questionnaire    Fear of Current or Ex-Partner: No    Emotionally Abused: No    Physically Abused: No    Sexually Abused: No     Review of Systems    General:  No chills, fever, night sweats or weight changes.  Cardiovascular:  No chest pain, dyspnea on exertion, edema, orthopnea, palpitations, paroxysmal nocturnal dyspnea. Dermatological: No rash, lesions/masses Respiratory: No cough, dyspnea Urologic: No hematuria, dysuria Abdominal:   No nausea, vomiting, diarrhea, bright red blood per rectum, melena, or hematemesis Neurologic:  No visual changes, wkns, changes in mental status. All other systems reviewed and are otherwise negative except as noted above.  Physical Exam    VS:  BP (!) 148/78   Pulse (!) 103   Ht 5\' 8"  (1.727 m)   Wt 174 lb 6.4 oz (79.1 kg)   SpO2 96%   BMI 26.52 kg/m  , BMI Body mass index is 26.52 kg/m. GEN: Well nourished, well developed, in no acute distress. HEENT: normal. Neck: Supple, no JVD, carotid bruits, or masses. Cardiac: RRR, no murmurs, rubs, or gallops. No clubbing, cyanosis, edema.  Radials/DP/PT 2+ and equal bilaterally.  Respiratory:  Respirations regular and unlabored, clear to auscultation bilaterally. GI: Soft, nontender, nondistended, BS + x  4. MS: no deformity or atrophy. Skin: warm and dry, no rash. Neuro:  Strength and sensation are intact. Psych: Normal affect.  Accessory Clinical Findings    Recent Labs: 03/18/2021: ALT 11; BUN 13; Creatinine 1.05; Hemoglobin 15.8; Platelet Count 176; Potassium 3.6; Sodium 138   Recent Lipid Panel    Component Value Date/Time   CHOL 143 10/13/2021 1503   CHOL 135 03/14/2020 1407   TRIG 220.0 (H) 10/13/2021 1503   HDL 40.70 10/13/2021 1503   HDL 38 (L) 03/14/2020 1407   CHOLHDL 4 10/13/2021 1503   VLDL 44.0 (H) 10/13/2021 1503   LDLCALC 59 03/14/2020 1407   LDLDIRECT 79.0 10/13/2021 1503    HYPERTENSION CONTROL Vitals:   01/23/22 0939 01/23/22 0959  BP: (!) 158/89 (!) 148/78    The patient's blood pressure is elevated above target today.  In order to address the patient's elevated BP: Blood pressure will be monitored at home to determine if medication changes need to be made.; A new  medication was prescribed today.       ECG personally reviewed by me today-none today.  Echocardiogram 04/05/2020  IMPRESSIONS     1. Left ventricular ejection fraction, by estimation, is 60 to 65%. The  left ventricle has normal function. The left ventricle has no regional  wall motion abnormalities. Left ventricular diastolic parameters are  consistent with Grade I diastolic  dysfunction (impaired relaxation).   2. Right ventricular systolic function is normal. The right ventricular  size is normal. Tricuspid regurgitation signal is inadequate for assessing  PA pressure.   3. The mitral valve is normal in structure. No evidence of mitral valve  regurgitation. No evidence of mitral stenosis.   4. The aortic valve is tricuspid. Aortic valve regurgitation is not  visualized. No aortic stenosis is present.   5. Aortic dilatation noted. There is borderline dilatation of the aortic  root, measuring 38 mm. There is borderline dilatation of the ascending  aorta, measuring 38 mm.   6. The  inferior vena cava is normal in size with greater than 50%  respiratory variability, suggesting right atrial pressure of 3 mmHg.   FINDINGS   Left Ventricle: Left ventricular ejection fraction, by estimation, is 60  to 65%. The left ventricle has normal function. The left ventricle has no  regional wall motion abnormalities. The left ventricular internal cavity  size was normal in size. There is   no left ventricular hypertrophy. Left ventricular diastolic parameters  are consistent with Grade I diastolic dysfunction (impaired relaxation).   Right Ventricle: The right ventricular size is normal. Right ventricular  systolic function is normal. Tricuspid regurgitation signal is inadequate  for assessing PA pressure. The tricuspid regurgitant velocity is 2.08 m/s,  and with an assumed right atrial   pressure of 3 mmHg, the estimated right ventricular systolic pressure is  89.3 mmHg.   Left Atrium: Left atrial size was normal in size.   Right Atrium: Right atrial size was normal in size.   Pericardium: There is no evidence of pericardial effusion.   Mitral Valve: The mitral valve is normal in structure. No evidence of  mitral valve regurgitation. No evidence of mitral valve stenosis.   Tricuspid Valve: The tricuspid valve is normal in structure. Tricuspid  valve regurgitation is trivial. No evidence of tricuspid stenosis.   Aortic Valve: The aortic valve is tricuspid. Aortic valve regurgitation is  not visualized. No aortic stenosis is present.   Pulmonic Valve: The pulmonic valve was normal in structure. Pulmonic valve  regurgitation is not visualized. No evidence of pulmonic stenosis.   Aorta: Aortic dilatation noted. There is borderline dilatation of the  aortic root, measuring 38 mm. There is borderline dilatation of the  ascending aorta, measuring 38 mm.   Venous: The inferior vena cava is normal in size with greater than 50%  respiratory variability, suggesting right atrial  pressure of 3 mmHg.   IAS/Shunts: No atrial level shunt detected by color flow Doppler.   Assessment & Plan   1.  Lower extremity edema-generalized ankle.  Weight today 174 down from 178 Changed to as needed furosemide, potassium Heart healthy low-sodium diet-salty 6 reviewed Increase physical activity as tolerated Lower extremity support stockings-[place] support stocking sheet given. Elevate lower extremities when not active Daily weights-contact office with a weight increase of 2 to 3 pounds overnight or 5 pounds in 1 week Order BMP  Essential hypertension-BP [id number].  Similar control at home. Continue amlodipine, irbesartan, Start coreg 6.25 BID Heart healthy low-sodium diet-salty 6  given Increase physical activity as tolerated  PAD, status post iliac artery stenting-denies claudication. Increase physical activity as tolerated Continue rosuvastatin Repeat lower extremity ABIs and Aorta/ivc/iliacs when clinically indicated  COPD-breathing stable.  No increased dyspnea on exertion. Follows with pulmonology, PCP  Tobacco abuse-reports smoking 10 to 30 cigarettes/day.  Did smoke just prior to his visit today. Smoking cessation information given   Disposition: Follow-up with Dr. Gardiner Rhyme in 1-2 months.   Jossie Ng. Mylan Lengyel NP-C     01/23/2022, 10:08 AM [place] [address] Office [phone] Fax [phone]    I spent 14 minutes examining this patient, reviewing medications, and using patient centered shared decision making involving her cardiac care.  Prior to her visit I spent greater than 20 minutes reviewing her past medical history,  medications, and prior cardiac tests.

## 2022-01-21 ENCOUNTER — Telehealth: Payer: Self-pay | Admitting: Pharmacist

## 2022-01-21 NOTE — Chronic Care Management (AMB) (Unsigned)
Chronic Care Management Pharmacy Assistant   Name: Timothy Wood  MRN: [id number] DOB: 10-07-1940  Reason for Encounter: Medication Review/ Medication Coordination   Conditions to be addressed/monitored: HTN  Recent office visits:  11/03/21 Timothy Simmering, LPN - Patient presented for Medicare Annual Wellness Exam. No medication changes.  11/03/21 Timothy Wood encounter for General Adult Medical Examination. No other visit details available.  10/13/21 [name], [name], MD - Patient presented for Routine general medical examination at a health care facility and other concerns. No medication changes.  09/12/21  [name], [name], MD - Claims encounter for Essential primary hypertension and other concerns. No other visit details available.  Recent consult visits:  01/15/22 Timothy Reel, NP (Cardiology) - Patient presented for Lower leg edema and other concerns. Prescribed Furosemide. Prescribed Potassium Chloride.  Hospital visits:  None in previous 6 months  Medications: Outpatient Encounter Medications as of 01/21/2022  Medication Sig   amLODipine (NORVASC) 5 MG tablet Take 1 tablet (5 mg total) by mouth daily. Please keep scheduled appointment for future refills. Thank you.   DULoxetine (CYMBALTA) 30 MG capsule TAKE ONE CAPSULE BY MOUTH DAILY   EPINEPHrine (EPIPEN 2-PAK) 0.3 mg/0.3 mL IJ SOAJ injection To use daily if needed for severe allergy reaction.   furosemide (LASIX) 20 MG tablet Take 1 tablet (20 mg total) by mouth daily.   irbesartan (AVAPRO) 150 MG tablet TAKE ONE TABLET BY MOUTH DAILY   omeprazole (PRILOSEC) 20 MG capsule TAKE ONE CAPSULE BY MOUTH TWICE A DAY BEFORE MEALS (Patient taking differently: Take 20 mg by mouth daily.)   potassium chloride (KLOR-CON) 10 MEQ tablet Take 1 tablet (10 mEq total) by mouth daily.   rivaroxaban (XARELTO) 10 MG TABS tablet Take 1 tablet (10 mg total) by mouth daily.   rosuvastatin (CRESTOR) 20 MG tablet TAKE ONE  TABLET BY MOUTH DAILY   tiZANidine (ZANAFLEX) 2 MG tablet TAKE ONE TABLET BY MOUTH TWICE A DAY AS NEEDED   No facility-administered encounter medications on file as of 01/21/2022.   Reviewed chart prior to disease state call. Spoke with patient regarding BP  Recent Office Vitals: BP Readings from Last 3 Encounters:  01/15/22 (!) 148/82  10/13/21 120/70  08/20/21 136/62   Pulse Readings from Last 3 Encounters:  01/15/22 88  10/13/21 88  03/24/21 85    Wt Readings from Last 3 Encounters:  01/15/22 178 lb (80.7 kg)  11/03/21 170 lb (77.1 kg)  10/13/21 172 lb 4 oz (78.1 kg)     Kidney Function Lab Results  Component Value Date/Time   CREATININE 1.05 03/18/2021 10:42 AM   CREATININE 0.95 09/10/2020 11:03 AM   CREATININE 1.2 10/12/2016 09:58 AM   CREATININE 1.0 04/06/2016 11:29 AM   GFR 62.52 08/14/2020 12:09 PM   GFRNONAA >60 03/18/2021 10:42 AM   GFRAA 87 03/14/2020 02:07 PM   GFRAA >60 05/26/2019 08:40 AM       Latest Ref Rng & Units 03/18/2021   10:42 AM 09/10/2020   11:03 AM 08/20/2020    2:00 PM  BMP  Glucose 70 - 99 mg/dL 111  98    BUN 8 - 23 mg/dL 13  9    Creatinine 0.61 - 1.24 mg/dL 1.05  0.95    Sodium 135 - 145 mmol/L 138  142    Potassium 3.5 - 5.1 mmol/L 3.6  4.3  4.1   Chloride 98 - 111 mmol/L 104  107    CO2 22 -  32 mmol/L 24  24    Calcium 8.9 - 10.3 mg/dL 9.0  8.7      Current antihypertensive regimen:  Amlodipine 5 mg 1 tablet daily (AM) - Appropriate, Query effective, Safe, Accessible Irbesartan 150 mg 1 tablet daily (AM) - Appropriate, Query effective, Safe, Accessible How often are you checking your Blood Pressure? {CHL HP BP Monitoring [phone]} Current home BP readings: *** What recent interventions/DTPs have been made by any provider to improve Blood Pressure control since last CPP Visit: *** Any recent hospitalizations or ED visits since last visit with CPP? {yes/no:20286} What diet changes have been made to improve Blood Pressure  Control?  *** What exercise is being done to improve your Blood Pressure Control?  ***  Adherence Review: Is the patient currently on ACE/ARB medication? {yes/no:20286} Does the patient have >5 day gap between last estimated fill dates? {yes/no:20286} [name] Need Dec/Jan Xarelto Proceed? Need out of pocket spend pharmacy printout for app  Care Gaps: COVID Booster - Overdue CCM- BP- AWV-  Star Rating Drugs: Irbesartan 150 mg - Last filled 01/15/22 90 DS at [facility] Rosuvastatin 20 mg - Last filled 01/01/22 90 DS at [facility] Pharmacist Assistant [phone]

## 2022-01-22 ENCOUNTER — Other Ambulatory Visit: Payer: Self-pay | Admitting: General Practice

## 2022-01-23 ENCOUNTER — Ambulatory Visit: Payer: Medicare HMO | Attending: General Practice | Admitting: General Practice

## 2022-01-23 ENCOUNTER — Encounter: Payer: Self-pay | Admitting: General Practice

## 2022-01-23 VITALS — BP 148/78 | HR 103 | Ht 68.0 in | Wt 174.4 lb

## 2022-01-23 DIAGNOSIS — J449 Chronic obstructive pulmonary disease, unspecified: Secondary | ICD-10-CM

## 2022-01-23 DIAGNOSIS — I739 Peripheral vascular disease, unspecified: Secondary | ICD-10-CM

## 2022-01-23 DIAGNOSIS — I10 Essential (primary) hypertension: Secondary | ICD-10-CM

## 2022-01-23 DIAGNOSIS — R600 Localized edema: Secondary | ICD-10-CM | POA: Diagnosis not present

## 2022-01-23 LAB — BASIC METABOLIC PANEL
BUN/Creatinine Ratio: 11 (ref 10–24)
BUN: 12 mg/dL (ref 8–27)
CO2: 24 mmol/L (ref 20–29)
Calcium: 9.2 mg/dL (ref 8.6–10.2)
Chloride: 103 mmol/L (ref 96–106)
Creatinine, Ser: 1.09 mg/dL (ref 0.76–1.27)
Glucose: 95 mg/dL (ref 70–99)
Potassium: 4.1 mmol/L (ref 3.5–5.2)
Sodium: 141 mmol/L (ref 134–144)
eGFR: 69 mL/min/{1.73_m2} (ref >59)

## 2022-01-23 MED ORDER — CARVEDILOL 6.25 MG PO TABS: 6.2500 mg | ORAL_TABLET | Freq: Two times a day (BID) | ORAL | 3 refills | Status: DC

## 2022-01-23 NOTE — Patient Instructions (Addendum)
Medication Instructions:  START Coreg 6.25mg  take 1 tablet twice a day  You can take Lasix on a as needed basis if you gain 2 lbs in a day of 5lbs in a week Take Potassium when you take Lasix  *If you need a refill on your cardiac medications before your next appointment, please call your pharmacy*   Lab Work: TODAY-BMET If you have labs (blood work) drawn today and your tests are completely normal, you will receive your results only by: [place] (if you have MyChart) OR A paper copy in the mail If you have any lab test that is abnormal or we need to change your treatment, we will call you to review the results.   Testing/Procedures: NONE ORDERED   Follow-Up: At [facility], you and your health needs are our priority.  As part of our continuing mission to provide you with exceptional heart care, we have created designated Provider Care Teams.  These Care Teams include your primary Cardiologist (physician) and Advanced Practice Providers (APPs -  Physician Assistants and Nurse Practitioners) who all work together to provide you with the care you need, when you need it.  We recommend signing up for the patient portal called "MyChart".  Sign up information is provided on this After Visit Summary.  MyChart is used to connect with patients for Virtual Visits (Telemedicine).  Patients are able to view lab/test results, encounter notes, upcoming appointments, etc.  Non-urgent messages can be sent to your provider as well.   To learn more about what you can do with MyChart, go to [email or web address].    Your next appointment:   1-2 month(s)  The format for your next appointment:   In Person  Provider:   Donato Heinz, MD  or Coletta Memos, NP   Other Instructions Check your weight daily Support hose handout  Important Information About Sugar

## 2022-02-18 ENCOUNTER — Other Ambulatory Visit: Payer: Self-pay | Admitting: Family Medicine

## 2022-02-18 DIAGNOSIS — M545 Other chronic pain: Secondary | ICD-10-CM

## 2022-03-13 ENCOUNTER — Other Ambulatory Visit: Payer: Self-pay | Admitting: General Practice

## 2022-03-19 ENCOUNTER — Other Ambulatory Visit: Payer: Self-pay

## 2022-03-19 ENCOUNTER — Ambulatory Visit (HOSPITAL_COMMUNITY)
Admission: RE | Admit: 2022-03-19 | Discharge: 2022-03-19 | Disposition: A | Discharge status: Home / Self Care | Payer: Medicare PPO | Source: Ambulatory Visit | Attending: Internal Medicine | Admitting: Internal Medicine

## 2022-03-19 ENCOUNTER — Inpatient Hospital Stay: Payer: Medicare PPO | Attending: Internal Medicine

## 2022-03-19 DIAGNOSIS — J439 Emphysema, unspecified: Secondary | ICD-10-CM | POA: Diagnosis not present

## 2022-03-19 DIAGNOSIS — C3490 Malignant neoplasm of unspecified part of unspecified bronchus or lung: Secondary | ICD-10-CM

## 2022-03-19 LAB — CBC WITH DIFFERENTIAL (CANCER CENTER ONLY)
Abs Immature Granulocytes: 0.04 10*3/uL (ref 0.00–0.07)
Basophils Absolute: 0.1 10*3/uL (ref 0.0–0.1)
Basophils Relative: 1 %
Eosinophils Absolute: 0.2 10*3/uL (ref 0.0–0.5)
Eosinophils Relative: 2 %
HCT: 47 % (ref 39.0–52.0)
Hemoglobin: 16.1 g/dL (ref 13.0–17.0)
Immature Granulocytes: 1 %
Lymphocytes Relative: 20 %
Lymphs Abs: 1.7 10*3/uL (ref 0.7–4.0)
MCH: 32.1 pg (ref 26.0–34.0)
MCHC: 34.3 g/dL (ref 30.0–36.0)
MCV: 93.8 fL (ref 80.0–100.0)
Monocytes Absolute: 1 10*3/uL (ref 0.1–1.0)
Monocytes Relative: 12 %
Neutro Abs: 5.6 10*3/uL (ref 1.7–7.7)
Neutrophils Relative %: 64 %
Platelet Count: 223 10*3/uL (ref 150–400)
RBC: 5.01 MIL/uL (ref 4.22–5.81)
RDW: 13.2 % (ref 11.5–15.5)
WBC Count: 8.6 10*3/uL (ref 4.0–10.5)
nRBC: 0 % (ref 0.0–0.2)

## 2022-03-19 LAB — CMP (CANCER CENTER ONLY)
ALT: 13 U/L (ref 0–44)
AST: 15 U/L (ref 15–41)
Albumin: 4.5 g/dL (ref 3.5–5.0)
Alkaline Phosphatase: 89 U/L (ref 38–126)
Anion gap: 6 (ref 5–15)
BUN: 14 mg/dL (ref 8–23)
CO2: 30 mmol/L (ref 22–32)
Calcium: 9.3 mg/dL (ref 8.9–10.3)
Chloride: 101 mmol/L (ref 98–111)
Creatinine: 1.11 mg/dL (ref 0.61–1.24)
GFR, Estimated: >60 mL/min (ref >60)
Glucose, Bld: 107 mg/dL — ABNORMAL HIGH (ref 70–99)
Potassium: 4.2 mmol/L (ref 3.5–5.1)
Sodium: 137 mmol/L (ref 135–145)
Total Bilirubin: 0.7 mg/dL (ref 0.3–1.2)
Total Protein: 6.9 g/dL (ref 6.5–8.1)

## 2022-03-19 MED ORDER — SODIUM CHLORIDE (PF) 0.9 % IJ SOLN
INTRAMUSCULAR | Status: AC
  Filled 2022-03-19: qty 50

## 2022-03-19 MED ORDER — IOHEXOL 300 MG/ML  SOLN
75.0000 mL | Freq: Once | INTRAMUSCULAR | Status: AC | PRN
  Administered 2022-03-19: 75 mL via INTRAVENOUS

## 2022-03-20 ENCOUNTER — Other Ambulatory Visit: Payer: Medicare HMO

## 2022-03-24 ENCOUNTER — Inpatient Hospital Stay: Payer: Medicare PPO | Admitting: Internal Medicine

## 2022-03-27 ENCOUNTER — Telehealth: Payer: Self-pay | Admitting: Family Medicine

## 2022-03-27 ENCOUNTER — Telehealth: Payer: Medicare HMO

## 2022-03-27 DIAGNOSIS — H353131 Nonexudative age-related macular degeneration, bilateral, early dry stage: Secondary | ICD-10-CM | POA: Diagnosis not present

## 2022-03-27 NOTE — Telephone Encounter (Signed)
Received incoming call from [facility]. Patient was there to be seen and when he went outside to smoke, he fell. He did hit his face and there is a gash on his nose. They were able to clean it up and his daughter is on the way to pick him up. They did mention that he had been drinking. Due to no appointments available in a timely manner, recommended urgent care to make sure he doesn't need any imaging due to hitting his face and to rule out any other possibilities.   Routing to PCP as FYI.

## 2022-04-17 ENCOUNTER — Telehealth: Payer: Self-pay

## 2022-04-17 ENCOUNTER — Other Ambulatory Visit: Payer: Self-pay | Admitting: General Practice

## 2022-04-17 NOTE — Progress Notes (Unsigned)
Patient ID: Timothy Wood, male   DOB: Feb 23, 1941, 82 y.o.   MRN: [id number]  Care Management & Coordination Services Pharmacy Team  Reason for Encounter: Offer to schedule follow up with Pharm /BP     04/17/22 Call to patient to offer to schedule follow up with Pharmacist. Did not reach patient did not have a voicemail set up on phone. Call to Daughter left her a detailed message with my contact information for return call.  What concerns do you have about your medications?   Chart Updates:  Recent office visits:  None  Recent consult visits:  01/23/22 Deberah Pelton, NP (Cardiology) - Patient presented for Lower Leg edema and other concerns. Prescribed Carvedilol.  Hospital visits:  {Hospital DC Yes/No:[id number]}  Medications: Outpatient Encounter Medications as of 04/17/2022  Medication Sig   amLODipine (NORVASC) 5 MG tablet Take 1 tablet (5 mg total) by mouth daily. Please keep scheduled appointment for future refills. Thank you.   carvedilol (COREG) 6.25 MG tablet Take 1 tablet (6.25 mg total) by mouth 2 (two) times daily.   DULoxetine (CYMBALTA) 30 MG capsule TAKE ONE CAPSULE BY MOUTH DAILY   EPINEPHrine (EPIPEN 2-PAK) 0.3 mg/0.3 mL IJ SOAJ injection To use daily if needed for severe allergy reaction.   furosemide (LASIX) 20 MG tablet TAKE 1 TABLET BY MOUTH DAILY   irbesartan (AVAPRO) 150 MG tablet TAKE ONE TABLET BY MOUTH DAILY   omeprazole (PRILOSEC) 20 MG capsule TAKE ONE CAPSULE BY MOUTH TWICE A DAY BEFORE MEALS (Patient taking differently: Take 20 mg by mouth daily.)   potassium chloride (KLOR-CON) 10 MEQ tablet Take 1 tablet (10 mEq total) by mouth daily.   rivaroxaban (XARELTO) 10 MG TABS tablet Take 1 tablet (10 mg total) by mouth daily.   rosuvastatin (CRESTOR) 20 MG tablet TAKE ONE TABLET BY MOUTH DAILY   tiZANidine (ZANAFLEX) 2 MG tablet TAKE ONE TABLET BY MOUTH TWICE A DAY AS NEEDED   No facility-administered encounter medications on file as of 04/17/2022.     Recent vitals BP Readings from Last 3 Encounters:  01/23/22 (!) 148/78  01/15/22 (!) 148/82  10/13/21 120/70   Pulse Readings from Last 3 Encounters:  01/23/22 (!) 103  01/15/22 88  10/13/21 88   Wt Readings from Last 3 Encounters:  01/23/22 174 lb 6.4 oz (79.1 kg)  01/15/22 178 lb (80.7 kg)  11/03/21 170 lb (77.1 kg)   BMI Readings from Last 3 Encounters:  01/23/22 26.52 kg/m  01/15/22 27.06 kg/m  11/03/21 25.10 kg/m    Recent lab results    Component Value Date/Time   NA 137 03/19/2022 1503   NA 141 01/23/2022 1030   NA 143 10/12/2016 0958   K 4.2 03/19/2022 1503   K 3.7 10/12/2016 0958   CL 101 03/19/2022 1503   CO2 30 03/19/2022 1503   CO2 26 10/12/2016 0958   GLUCOSE 107 (H) 03/19/2022 1503   GLUCOSE 113 10/12/2016 0958   BUN 14 03/19/2022 1503   BUN 12 01/23/2022 1030   BUN 17.8 10/12/2016 0958   CREATININE 1.11 03/19/2022 1503   CREATININE 1.2 10/12/2016 0958   CALCIUM 9.3 03/19/2022 1503   CALCIUM 9.2 10/12/2016 0958    Lab Results  Component Value Date   CREATININE 1.11 03/19/2022   GFR 62.52 08/14/2020   EGFR 69 01/23/2022   GFRNONAA >60 03/19/2022   GFRAA 87 03/14/2020   Lab Results  Component Value Date/Time   HGBA1C 5.7 01/26/2013 08:12 AM  Lab Results  Component Value Date   CHOL 143 10/13/2021   HDL 40.70 10/13/2021   LDLCALC 59 03/14/2020   LDLDIRECT 79.0 10/13/2021   TRIG 220.0 (H) 10/13/2021   CHOLHDL 4 10/13/2021    Care Gaps: AWV- 11/03/21 Zoster Vaccine - Overdue   Star Rating Drugs:  Irbesartan 150 mg - Last filled 04/09/22 90 DS at [facility] Rosuvastatin 20 mg - Last filled 04/01/22 90 DS at [place] Pharmacist Assistant [phone]

## 2022-04-19 NOTE — Progress Notes (Unsigned)
Cardiology Office Note:    Date:  04/20/2022   ID:  Timothy Wood, DOB [birth year], MRN [id number]  PCP:  [name], [name], MD  Cardiologist:  Donato Heinz, MD  Electrophysiologist:  None   Referring MD: [name], [name], MD   Chief Complaint  Patient presents with   Follow-up    1-2 months.   Edema    Feet and ankles.    History of Present Illness:    Timothy Wood is a 82 y.o. male with a hx of lung cancer, adenomatous colon polyps, DVT/PE on Eliquis, COPD, tobacco use, seizures, hypertension, hyperlipidemia, PAD s/p iliac stenting in 2010, and descending aortic aneurysm status post repair in 2012 who presents for follow-up.  He was referred for preop evaluation prior to colonoscopy, initially seen on 02/28/2020.  Smokes 1 pack/day for over 60 years.    Echocardiogram on 04/05/2020 showed normal biventricular function, grade 1 diastolic dysfunction, no significant valvular disease.  Since last clinic visit, he reports he is doing well.  Denies any chest pain, dyspnea, lower extremity edema, or palpitations.  Reports some lightheadedness but denies any syncope.  He continues to smoke about 15 to 20 cigarettes/day.  Denies any bleeding on Xarelto.  Brought home BP log, reports SBP 80s to 150s.  Checks with wrist monitor.  Past Medical History:  Diagnosis Date   Adenocarcinoma of lung, stage 1 ([place])    Anemia    Angiodysplasia of cecum 11/23/2017   Multiple 1-2 mm   Arthritis    back possibly   Blood clotting disorder (HCC)    Cataract    removed bilaterally    Chronic obstructive pulmonary disease (COPD) ([place])    "patient not aware"   Clotting disorder ([place])    PE LUNG, dvt leg    Colon polyps    Depression    Diverticulosis    DVT (deep venous thrombosis) ([place])    Post op  knee surgery   DVT (deep venous thrombosis) (HCC)    GERD (gastroesophageal reflux disease)    Histoplasmosis    Histoplasmosis with pneumonia ([place]) 04/01/2015   Hypercholesteremia     Hyperlipidemia    Hypertension    Laceration of head 08/05/2015   Lumbar compression fracture ([place])    Lung cancer ([place]) dx'd 2016   Personal history of colonic polyps 2008   multiple adenomas 2008-2009 exams with large cecal adenoma   Pneumonia    in history   Polyposis coli - attenuated 09/12/2010   2008: adenomas with 9 polyps index and 2.5 cm cecal polyp removed over 3 exams, last 2009 with APC. diverticulosis also. 11/24/2010: 12 polyps removed - adenomas Anticipate routine repeat colonoscopy Spring 2013 Have recommended he see genetics counselor re ? Polyposis 03/27/2016 14 polyps removed max 15 mm    Pulmonary nodule 06/2006   9 mm right upper obe pulmonary nodule (negative Pet Scan)   PVD (peripheral vascular disease) (HCC)    left leg s/p stent   Sebaceous cyst    neck   Seborrheic keratosis    Seizures ([place])    4-5 yrs ago had seizures when in ICU- none since this time    Sinusitis    Tobacco abuse    Tremor     Past Surgical History:  Procedure Laterality Date   ANGIOPLASTY / STENTING ILIAC  08/2008   Dr. Gray Bernhardt SURGERY  1975, 1978   x2 lumbar fusion over 30 years ago   BACK SURGERY  COLONOSCOPY  05/11/2006; 07/27/2006;05/05/2007, 11/24/2010   adenomas with 9 polyps index and 2.5 cm cecal polyp removed over 3 exams, last 2009 with APC. diverticulosis also.  2012: 12 adenomas removed, largest 8-10 mm, diverticulosis   CORONARY ANGIOPLASTY WITH STENT PLACEMENT     DESCENDING AORTIC ANEURYSM REPAIR W/ STENT  04/2010   Dr. Trula Slade   ESOPHAGOGASTRODUODENOSCOPY  05/05/2007   GERD - one erosion   EYE SURGERY Bilateral    cataract   KNEE SURGERY  2002   left- cartilage    LUNG BIOPSY     PERCUTANEOUS PINNING Left 06/24/2012   Procedure: Repair Complex Laceration Left Thumb with percutaneous pinning.;  Surgeon: Dennie Bible, MD;  Location: [facility];  Service: Plastics;  Laterality: Left;   THUMB AMPUTATION Left    pinning and index finger   TONSILLECTOMY  1956    TONSILLECTOMY     UPPER GASTROINTESTINAL ENDOSCOPY     VIDEO ASSISTED THORACOSCOPY (VATS)/WEDGE RESECTION Right 01/24/2015   Procedure: RIGHT VIDEO ASSISTED THORACOSCOPY (VATS)/WEDGE RESECTION;  Surgeon: [name], MD;  Location: [facility];  Service: Thoracic;  Laterality: Right;   vocal cord polyps removed      Current Medications: Current Meds  Medication Sig   amLODipine (NORVASC) 5 MG tablet Take 1 tablet (5 mg total) by mouth daily. Please keep scheduled appointment for future refills. Thank you.   carvedilol (COREG) 6.25 MG tablet Take 1 tablet (6.25 mg total) by mouth 2 (two) times daily.   DULoxetine (CYMBALTA) 30 MG capsule TAKE ONE CAPSULE BY MOUTH DAILY   EPINEPHrine (EPIPEN 2-PAK) 0.3 mg/0.3 mL IJ SOAJ injection To use daily if needed for severe allergy reaction.   furosemide (LASIX) 20 MG tablet TAKE 1 TABLET BY MOUTH DAILY   irbesartan (AVAPRO) 150 MG tablet TAKE ONE TABLET BY MOUTH DAILY   omeprazole (PRILOSEC) 20 MG capsule TAKE ONE CAPSULE BY MOUTH TWICE A DAY BEFORE MEALS (Patient taking differently: Take 20 mg by mouth daily.)   rivaroxaban (XARELTO) 10 MG TABS tablet Take 1 tablet (10 mg total) by mouth daily.   rosuvastatin (CRESTOR) 20 MG tablet TAKE ONE TABLET BY MOUTH DAILY   tiZANidine (ZANAFLEX) 2 MG tablet TAKE ONE TABLET BY MOUTH TWICE A DAY AS NEEDED     Allergies:   Codeine and Hydrocodone   Social History   Socioeconomic History   Marital status: Widowed    Spouse name: Not on file   Number of children: 2   Years of education: Not on file   Highest education level: Not on file  Occupational History   Occupation: Retired    Fish farm manager: RETIRED  Tobacco Use   Smoking status: Every Day    Packs/day: 1.00    Years: 60.00    Total pack years: 60.00    Types: Cigarettes   Smokeless tobacco: Never  Vaping Use   Vaping Use: Never used  Substance and Sexual Activity   Alcohol use: Yes    Comment: beer on occasion   Drug use: No   Sexual  activity: Not Currently  Other Topics Concern   Not on file  Social History Narrative       Married for over 50 years.  Wife died from lung cancer.     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     Occasional alcohol.      Tobacco use - over 50 pack years.   Father died at age 43 of suicide.  Selinda Eon Ellis-daughter emergenc   y contact   Social Determinants of Health   Financial Resource Strain: Low Risk  (11/03/2021)   Overall Financial Resource Strain (CARDIA)    Difficulty of Paying Living Expenses: Not hard at all  Food Insecurity: No Food Insecurity (11/03/2021)   Hunger Vital Sign    Worried About Running Out of Food in the Last Year: Never true    Ran Out of Food in the Last Year: Never true  Transportation Needs: No Transportation Needs (11/03/2021)   PRAPARE - Hydrologist (Medical): No    Lack of Transportation (Non-Medical): No  Physical Activity: Inactive (11/03/2021)   Exercise Vital Sign    Days of Exercise per Week: 0 days    Minutes of Exercise per Session: 0 min  Stress: Stress Concern Present (11/03/2021)   [place]    Feeling of Stress : To some extent  Social Connections: Socially Isolated (05/08/2020)   Social Connection and Isolation Panel [NHANES]    Frequency of Communication with Friends and Family: More than three times a week    Frequency of Social Gatherings with Friends and Family: More than three times a week    Attends Religious Services: Never    Marine scientist or Organizations: No    Attends Archivist Meetings: Never    Marital Status: Widowed     Family History: The patient's family history includes Cancer (age of onset: 68) in his mother; Colon cancer (age of onset: 63) in his mother; Deep vein thrombosis in his mother; Diabetes in an other family member; Heart disease in his brother and brother; Lung  cancer in his mother and son; Rheum arthritis in his daughter and sister; Uterine cancer in his mother. There is no history of Colon polyps, Stomach cancer, or Rectal cancer.  ROS:   Please see the history of present illness.     All other systems reviewed and are negative.  EKGs/Labs/Other Studies Reviewed:    The following studies were reviewed today:   EKG:  EKG is not ordered today.  The ekg ordered at prior clinic visit demonstrates normal sinus rhythm, rate 95,  no ST/T abnormalities  TTE 08/06/15: - Left ventricle: The cavity size was normal. Wall thickness was   normal. Systolic function was vigorous. The estimated ejection   fraction was in the range of 65% to 70%. Wall motion was normal;   there were no regional wall motion abnormalities. Doppler   parameters are consistent with abnormal left ventricular   relaxation (grade 1 diastolic dysfunction).  Recent Labs: 03/19/2022: ALT 13; BUN 14; Creatinine 1.11; Hemoglobin 16.1; Platelet Count 223; Potassium 4.2; Sodium 137  Recent Lipid Panel    Component Value Date/Time   CHOL 143 10/13/2021 1503   CHOL 135 03/14/2020 1407   TRIG 220.0 (H) 10/13/2021 1503   HDL 40.70 10/13/2021 1503   HDL 38 (L) 03/14/2020 1407   CHOLHDL 4 10/13/2021 1503   VLDL 44.0 (H) 10/13/2021 1503   LDLCALC 59 03/14/2020 1407   LDLDIRECT 79.0 10/13/2021 1503    Physical Exam:    VS:  BP (!) 140/90 (BP Location: Left Arm, Patient Position: Sitting, Cuff Size: Normal)   Pulse 75   Ht 5\' 8"  (1.727 m)   Wt 169 lb (76.7 kg)   BMI 25.70 kg/m     Wt Readings from Last 3 Encounters:  04/20/22 169 lb (76.7 kg)  01/23/22 174 lb 6.4 oz (79.1 kg)  01/15/22 178 lb (80.7 kg)     GEN: Well nourished, well developed in no acute distress HEENT: Normal NECK: No JVD LYMPHATICS: No lymphadenopathy CARDIAC: RRR, no murmurs, rubs, gallops RESPIRATORY:  Clear to auscultation without rales, wheezing or rhonchi  ABDOMEN: Soft, non-tender,  non-distended MUSCULOSKELETAL: No edema; No deformity  SKIN: Warm and dry NEUROLOGIC:  Alert and oriented x 3 PSYCHIATRIC:  Normal affect   ASSESSMENT:    1. Essential hypertension   2. PAD (peripheral artery disease) ([place])   3. Hyperlipidemia, unspecified hyperlipidemia type   4. Tobacco use   5. Lower leg edema     PLAN:     Hypertension: On amlodipine 5 mg daily, carvedilol 6.25 mg twice daily, irbesartan 150 mg daily.  Elevated in clinic today but very labile BP on home log (SBP 80s to 150s).  Suspect home BP measurements inaccurate as using wrist cuff.  Recommended obtaining upper arm monitor and check BP twice daily for next week and let us know results  Lower extremity edema: Previously with mild bilateral lower extremity edema.  Echocardiogram shows no structural heart disease.  Normal albumin.  Suspect due to amlodipine use.  Has improved.  Takes Lasix 20 mg daily -Check BMET, magnesium  PAD: Status post iliac artery stenting.  Continue Xarelto, rosuvastatin.  Normal ABIs 08/2020  H/o DVT/PE: on Xarelto  Hyperlipidemia: Goal LDL less than 70 given PAD.  On rosuvastatin 20 mg daily.  LDL 79 on 10/13/2021.  Check lipid panel  Tobacco use: Patient counseled on the risks of tobacco use and cessation strongly encouraged.  Patient is not interested in quitting smoking at this time  RTC in 6 months    Medication Adjustments/Labs and Tests Ordered: Current medicines are reviewed at length with the patient today.  Concerns regarding medicines are outlined above.  Orders Placed This Encounter  Procedures   Basic metabolic panel   Magnesium   Lipid panel   No orders of the defined types were placed in this encounter.   Patient Instructions  Medication Instructions:  Your physician recommends that you continue on your current medications as directed. Please refer to the Current Medication list given to you today.  *If you need a refill on your cardiac medications before  your next appointment, please call your pharmacy*   Lab Work: BMET, Mag, Lipid today  If you have labs (blood work) drawn today and your tests are completely normal, you will receive your results only by: [place] (if you have MyChart) OR A paper copy in the mail If you have any lab test that is abnormal or we need to change your treatment, we will call you to review the results.  Follow-Up: At [facility], you and your health needs are our priority.  As part of our continuing mission to provide you with exceptional heart care, we have created designated Provider Care Teams.  These Care Teams include your primary Cardiologist (physician) and Advanced Practice Providers (APPs -  Physician Assistants and Nurse Practitioners) who all work together to provide you with the care you need, when you need it.  We recommend signing up for the patient portal called "MyChart".  Sign up information is provided on this After Visit Summary.  MyChart is used to connect with patients for Virtual Visits (Telemedicine).  Patients are able to view lab/test results, encounter notes, upcoming appointments, etc.  Non-urgent messages can be sent to your provider as well.  To learn more about what you can do with MyChart, go to [email or web address].    Your next appointment:   6 month(s)  Provider:   Donato Heinz, MD     Other Instructions Please check your blood pressure at home twice daily, write it down.  Call the office or send message via Mychart with the readings in 1 week for Dr. Gardiner Rhyme to review.    --recommend Omron upper arm blood pressure cuff     Signed, Donato Heinz, MD  04/20/2022 11:14 AM    [place]

## 2022-04-20 ENCOUNTER — Encounter: Payer: Self-pay | Admitting: Cardiology

## 2022-04-20 ENCOUNTER — Ambulatory Visit: Payer: Medicare PPO | Attending: Cardiology | Admitting: Cardiology

## 2022-04-20 VITALS — BP 140/90 | HR 75 | Ht 68.0 in | Wt 169.0 lb

## 2022-04-20 DIAGNOSIS — E785 Hyperlipidemia, unspecified: Secondary | ICD-10-CM | POA: Diagnosis not present

## 2022-04-20 DIAGNOSIS — R600 Localized edema: Secondary | ICD-10-CM | POA: Diagnosis not present

## 2022-04-20 DIAGNOSIS — I10 Essential (primary) hypertension: Secondary | ICD-10-CM

## 2022-04-20 DIAGNOSIS — Z720 Tobacco use: Secondary | ICD-10-CM | POA: Diagnosis not present

## 2022-04-20 DIAGNOSIS — I739 Peripheral vascular disease, unspecified: Secondary | ICD-10-CM | POA: Diagnosis not present

## 2022-04-20 NOTE — Patient Instructions (Signed)
Medication Instructions:  Your physician recommends that you continue on your current medications as directed. Please refer to the Current Medication list given to you today.  *If you need a refill on your cardiac medications before your next appointment, please call your pharmacy*   Lab Work: BMET, Mag, Lipid today  If you have labs (blood work) drawn today and your tests are completely normal, you will receive your results only by: [place] (if you have MyChart) OR A paper copy in the mail If you have any lab test that is abnormal or we need to change your treatment, we will call you to review the results.  Follow-Up: At [facility], you and your health needs are our priority.  As part of our continuing mission to provide you with exceptional heart care, we have created designated Provider Care Teams.  These Care Teams include your primary Cardiologist (physician) and Advanced Practice Providers (APPs -  Physician Assistants and Nurse Practitioners) who all work together to provide you with the care you need, when you need it.  We recommend signing up for the patient portal called "MyChart".  Sign up information is provided on this After Visit Summary.  MyChart is used to connect with patients for Virtual Visits (Telemedicine).  Patients are able to view lab/test results, encounter notes, upcoming appointments, etc.  Non-urgent messages can be sent to your provider as well.   To learn more about what you can do with MyChart, go to [email or web address].    Your next appointment:   6 month(s)  Provider:   Donato Heinz, MD     Other Instructions Please check your blood pressure at home twice daily, write it down.  Call the office or send message via Mychart with the readings in 1 week for Dr. Gardiner Rhyme to review.    --recommend Omron upper arm blood pressure cuff

## 2022-04-21 ENCOUNTER — Inpatient Hospital Stay: Payer: Medicare PPO | Attending: Internal Medicine | Admitting: Internal Medicine

## 2022-04-21 LAB — LIPID PANEL
Chol/HDL Ratio: 3.6 ratio (ref 0.0–5.0)
Cholesterol, Total: 130 mg/dL (ref 100–199)
HDL: 36 mg/dL — ABNORMAL LOW (ref >39)
LDL Chol Calc (NIH): 59 mg/dL (ref 0–99)
Triglycerides: 216 mg/dL — ABNORMAL HIGH (ref 0–149)
VLDL Cholesterol Cal: 35 mg/dL (ref 5–40)

## 2022-04-21 LAB — BASIC METABOLIC PANEL
BUN/Creatinine Ratio: 12 (ref 10–24)
BUN: 11 mg/dL (ref 8–27)
CO2: 27 mmol/L (ref 20–29)
Calcium: 8.9 mg/dL (ref 8.6–10.2)
Chloride: 102 mmol/L (ref 96–106)
Creatinine, Ser: 0.93 mg/dL (ref 0.76–1.27)
Glucose: 118 mg/dL — ABNORMAL HIGH (ref 70–99)
Potassium: 3.8 mmol/L (ref 3.5–5.2)
Sodium: 142 mmol/L (ref 134–144)
eGFR: 82 mL/min/{1.73_m2} (ref >59)

## 2022-04-21 LAB — MAGNESIUM: Magnesium: 1.3 mg/dL — ABNORMAL LOW (ref 1.6–2.3)

## 2022-04-22 ENCOUNTER — Other Ambulatory Visit: Payer: Self-pay | Admitting: *Deleted

## 2022-04-22 DIAGNOSIS — R790 Abnormal level of blood mineral: Secondary | ICD-10-CM

## 2022-04-22 DIAGNOSIS — Z79899 Other long term (current) drug therapy: Secondary | ICD-10-CM

## 2022-04-22 DIAGNOSIS — I10 Essential (primary) hypertension: Secondary | ICD-10-CM

## 2022-04-22 MED ORDER — POTASSIUM CHLORIDE ER 10 MEQ PO TBCR: 10.0000 meq | EXTENDED_RELEASE_TABLET | Freq: Every day | ORAL | 3 refills | Status: DC | PRN

## 2022-04-22 MED ORDER — MAGNESIUM OXIDE 400 MG PO TABS: 400.0000 mg | ORAL_TABLET | Freq: Every day | ORAL | 0 refills | Status: DC

## 2022-04-22 MED ORDER — FUROSEMIDE 20 MG PO TABS: 20.0000 mg | ORAL_TABLET | Freq: Every day | ORAL | 3 refills | Status: DC | PRN

## 2022-04-27 ENCOUNTER — Other Ambulatory Visit: Payer: Self-pay | Admitting: General Practice

## 2022-04-29 ENCOUNTER — Other Ambulatory Visit (HOSPITAL_COMMUNITY): Payer: Self-pay

## 2022-04-29 ENCOUNTER — Other Ambulatory Visit: Payer: Self-pay

## 2022-04-29 MED ORDER — CARVEDILOL 6.25 MG PO TABS: 6.2500 mg | ORAL_TABLET | Freq: Two times a day (BID) | ORAL | 1 refills | Status: DC

## 2022-05-06 NOTE — Progress Notes (Incomplete)
Care Management & Coordination Services Pharmacy Note  05/08/22 Name:  Timothy Wood MRN:  [id number] DOB:  06/03/1940  Summary: -Pt admits to nonadherence to medications, stating he forgets. Counseled on importance of not missing his xarelto dose or other medications -Reports well-controlled BP at home (117/65 HR 74 this morning)  Recommendations/Changes made from today's visit: -Continue medication therapy -Continue twice daily BP/HR monitoring -Counseled patient on importance of adherence to medications  Follow up plan: Adherence check-in for [place] in 1 and 3 months BP review in 2 months Pharmacist visit in 5 months   Subjective: Timothy Wood is an 82 y.o. year old male who is a primary patient of [name], Malka So, MD.  The care coordination team was consulted for assistance with disease management and care coordination needs.    Engaged with patient by telephone for follow up visit.  Recent office visits: None  Recent consult visits: 04/20/21 Donato Heinz, MD (Cardio), For general follow up, no medication changes 01/23/22 Deberah Pelton, NP (Cardio). START carvedilol 6.'25mg'$  BID 01/15/22 [name], [place] (Cardio). For lower leg edema, START lasix '20mg'$  daily, Potassium Cl 10 mEq daily  Hospital visits: None in previous 6 months   Objective:  Lab Results  Component Value Date   CREATININE 0.93 04/20/2022   BUN 11 04/20/2022   GFR 62.52 08/14/2020   EGFR 82 04/20/2022   GFRNONAA >60 03/19/2022   GFRAA 87 03/14/2020   NA 142 04/20/2022   K 3.8 04/20/2022   CALCIUM 8.9 04/20/2022   CO2 27 04/20/2022   GLUCOSE 118 (H) 04/20/2022    Lab Results  Component Value Date/Time   HGBA1C 5.7 01/26/2013 08:12 AM   GFR 62.52 08/14/2020 12:09 PM   GFR 66.28 10/01/2017 09:17 AM    Last diabetic Eye exam: No results found for: "HMDIABEYEEXA"  Last diabetic Foot exam: No results found for: "HMDIABFOOTEX"   Lab Results  Component Value Date   CHOL  130 04/20/2022   HDL 36 (L) 04/20/2022   LDLCALC 59 04/20/2022   LDLDIRECT 79.0 10/13/2021   TRIG 216 (H) 04/20/2022   CHOLHDL 3.6 04/20/2022       Latest Ref Rng & Units 03/19/2022    3:03 PM 03/18/2021   10:42 AM 09/10/2020   11:03 AM  Hepatic Function  Total Protein 6.5 - 8.1 g/dL 6.9  7.0  6.9   Albumin 3.5 - 5.0 g/dL 4.5  4.3  3.7   AST 15 - 41 U/L '15  13  14   '$ ALT 0 - 44 U/L '13  11  9   '$ Alk Phosphatase 38 - 126 U/L 89  84  77   Total Bilirubin 0.3 - 1.2 mg/dL 0.7  0.8  0.8     Lab Results  Component Value Date/Time   TSH 1.77 10/15/2015 08:01 AM   TSH 2.42 06/29/2014 01:43 PM       Latest Ref Rng & Units 03/19/2022    3:03 PM 03/18/2021   10:42 AM 09/10/2020   11:03 AM  CBC  WBC 4.0 - 10.5 K/uL 8.6  8.3  5.5   Hemoglobin 13.0 - 17.0 g/dL 16.1  15.8  14.9   Hematocrit 39.0 - 52.0 % 47.0  46.0  45.0   Platelets 150 - 400 K/uL 223  176  172     No results found for: "VD25OH", "VITAMINB12"  Clinical ASCVD: No  The ASCVD Risk score (Arnett DK, et al., 2019) failed to calculate for the following  reasons:   The 2019 ASCVD risk score is only valid for ages 61 to 77        11/03/2021   11:41 AM 10/13/2021    2:22 PM 02/14/2021    9:59 AM  Depression screen PHQ 2/9  Decreased Interest '1 3 3  '$ Down, Depressed, Hopeless '2 1 1  '$ PHQ - 2 Score '3 4 4  '$ Altered sleeping '1 3 3  '$ Tired, decreased energy 0 3 3  Change in appetite 0 0 0  Feeling bad or failure about yourself  0 1 1  Trouble concentrating 0 0 1  Moving slowly or fidgety/restless 0 0 0  Suicidal thoughts 0 0 0  PHQ-9 Score '4 11 12  '$ Difficult doing work/chores Not difficult at all Somewhat difficult      Social History   Tobacco Use  Smoking Status Every Day   Packs/day: 1.00   Years: 60.00   Total pack years: 60.00   Types: Cigarettes  Smokeless Tobacco Never   BP Readings from Last 3 Encounters:  04/20/22 (!) 140/90  01/23/22 (!) 148/78  01/15/22 (!) 148/82   Pulse Readings from Last 3 Encounters:   04/20/22 75  01/23/22 (!) 103  01/15/22 88   Wt Readings from Last 3 Encounters:  04/20/22 169 lb (76.7 kg)  01/23/22 174 lb 6.4 oz (79.1 kg)  01/15/22 178 lb (80.7 kg)   BMI Readings from Last 3 Encounters:  04/20/22 25.70 kg/m  01/23/22 26.52 kg/m  01/15/22 27.06 kg/m    Allergies  Allergen Reactions   Codeine Anaphylaxis   Hydrocodone Itching    Medications Reviewed Today     Reviewed by Maren Reamer, RPH (Pharmacist) on 05/10/22 at 1443  Med List Status: <None>   Medication Order Taking? Sig Documenting Provider Last Dose Status Informant  Acetaminophen 500 MG capsule [id number] Yes Take 1,000 mg by mouth daily as needed for fever. [provider]  Active Self  amLODipine (NORVASC) 5 MG tablet [id number]  Take 1 tablet (5 mg total) by mouth daily. Donato Heinz, MD  Active   carvedilol (COREG) 6.25 MG tablet [id number]  Take 1 tablet (6.25 mg total) by mouth 2 (two) times daily. Donato Heinz, MD  Active   DULoxetine (CYMBALTA) 30 MG capsule [id number]  TAKE ONE CAPSULE BY MOUTH DAILY [name], [name], MD  Active   EPINEPHrine (EPIPEN 2-PAK) 0.3 mg/0.3 mL IJ SOAJ injection [id number]  To use daily if needed for severe allergy reaction. [name], [name], MD  Active   furosemide (LASIX) 20 MG tablet [id number]  Take 1 tablet (20 mg total) by mouth daily as needed (for weight increase of 3 lbs overnight or 5 lbs in 1 week). Donato Heinz, MD  Active   irbesartan (AVAPRO) 150 MG tablet [id number]  TAKE ONE TABLET BY MOUTH DAILY [name], [name], MD  Active   magnesium oxide (MAG-OX) 400 MG tablet [id number]  Take 1 tablet (400 mg total) by mouth daily. For 3 days Donato Heinz, MD  Active   omeprazole (PRILOSEC) 20 MG capsule [id number]  TAKE ONE CAPSULE BY MOUTH TWICE A DAY BEFORE MEALS  Patient taking differently: Take 20 mg by mouth daily.   [name], [name], MD  Active   potassium chloride (KLOR-CON) 10 MEQ tablet [id number]  Take  1 tablet (10 mEq total) by mouth daily as needed (with lasix). Donato Heinz, MD  Active   rivaroxaban (XARELTO) 10 MG TABS tablet [id number]  Take 1  tablet (10 mg total) by mouth daily. [name], [name], MD  Active   rosuvastatin (CRESTOR) 20 MG tablet [id number]  TAKE ONE TABLET BY MOUTH DAILY [name], [name], MD  Active   tiZANidine (ZANAFLEX) 2 MG tablet [id number]  TAKE ONE TABLET BY MOUTH TWICE A DAY AS NEEDED [name], [name], MD  Active             SDOH:  (Social Determinants of Health) assessments and interventions performed: Yes SDOH Interventions    [place] Coordination from 05/08/2022 in [facility] from 11/03/2021 in [facility] at [facility] from 10/13/2021 in [facility] at [place] Management from 08/20/2021 in [facility] at [facility] from 05/08/2020 in [facility] at [facility] from 10/16/2019 in [facility] Interventions Intervention Not Indicated -- -- -- Intervention Not Indicated --  Housing Interventions Intervention Not Indicated -- -- -- Intervention Not Indicated --  Transportation Interventions Intervention Not Indicated -- -- Intervention Not Indicated Intervention Not Indicated --  Utilities Interventions Intervention Not Indicated -- -- -- -- --  Depression Interventions/Treatment  -- Currently on Treatment Patient refuses Treatment -- Currently on Treatment Medication  Financial Strain Interventions -- -- -- Intervention Not Indicated Intervention Not Indicated --  Physical Activity Interventions -- -- -- -- Intervention Not Indicated --  Stress Interventions -- -- -- -- Intervention Not Indicated --  Social Connections Interventions -- -- -- -- Intervention Not Indicated --       Medication Assistance: None  required.  Patient affirms current coverage meets needs.  Medication Access: Within the past 30 days, how often has patient missed a dose of medication? 2-3 Is a pillbox or other method used to improve adherence? Yes  Factors that may affect medication adherence? nonadherence to medications and lack of understanding of disease management Are meds synced by current pharmacy? No  Are meds delivered by current pharmacy? No  Does patient experience delays in picking up medications due to transportation concerns? No   [facility] Reviewed: Is patient disadvantaged to use [facility]?: No  Current Rx insurance plan: [employer] Name and location of Current pharmacy:  [facility] [id number] - [address] [place] [address] Phone: [phone] Fax: [phone]  [facility] services reviewed with patient today?: No  Patient requests to transfer care to [facility]?: No  Reason patient declined to change pharmacies: Not mentioned at this visit  Compliance/Adherence/Medication fill history: Care Gaps: COVID Vaccine BP >140/90 Star-Rating Drugs: Irbesartan '150mg'$  PDC 98% Rosuvastatin '20mg'$  PDC 100%   Assessment/Plan   Hypertension (BP goal <130/80) -Controlled -Current treatment: Amlodipine '5mg'$  1 qd Appropriate, Effective, Safe, Accessible Carvedilol 6.'25mg'$  1 BID Appropriate, Effective, Safe, Accessible Lasix '20mg'$  1qd prn Appropriate, Effective, Safe, Accessible Irbesartan '150mg'$  1 qd Appropriate, Effective, Safe, Accessible -Medications previously tried: Losartan  -Current home readings: twice a day for last month 117/65 HR 74 -Current dietary habits: adding salt to food to taste, refusal to try salt substitute like Mrs. Dash -Current exercise habits: Minimal, light housework -Reports hypotensive/hypertensive symptoms  Reports some dizziness at night if all lights are off, does not check BP at these  times -Educated on BP goals and benefits of medications for prevention of heart attack, stroke and kidney damage; Daily salt intake goal <  2300 mg; Importance of home blood pressure monitoring; Symptoms of hypotension and importance of maintaining adequate hydration; -Counseled to monitor BP at home twice daily, document, and provide log at future appointments -Counseled on diet and exercise extensively Recommended to continue current medication  Hyperlipidemia: (LDL goal < 70) -Controlled -Current treatment: Rosuvastatin '20mg'$  1 qd Appropriate, Effective, Safe, Accessible -Medications previously tried: None  -Current dietary patterns: "In my 48s, will eat what I want when I want" -Current exercise habits: see above -Recommended to continue current medication -Future consideration: Addition of fish oil OTC to lower TG  Tobacco use (Goal: Smoking Cessation) -Uncontrolled -Previous quit attempts: None -Current treatment  None -Reports no desire to quite at this time despite being aware of health benefits -Current smoking habits: Reports "heaving smoking" as much a 2 ppd, and a light day might only be 1/2-1ppd for pt -Counseled on health benefits of smoking, will assess patient motivation to quit at next appt  CAD, Hx of PE (Goal: Prevention of cardiac event, clot and major bleed event) -Not ideally controlled -Current treatment  Xarelto '10mg'$  1 qd Appropriate, Effective, Safe, Accessible -Medications previously tried: Eliquis  -Recommended to continue current medication Counseled on importance of adherence to medication and not missing a single dose Assessed patient finances. Patient reports no issues obtaining. -Will closely monitor adherence at this time as patient's list fill is from 10/2021 and pt denies obtaining anywhere other than [facility]  Depression/Anxiety  -Not assessed today -Current treatment: Cymbalta '30mg'$  1 qd Appropriate, Effective, Safe, Accessible  Chronic  Pain  -Not assessed today -Current treatment  Tizanidine '2mg'$  BID prn Appropriate, Effective, Safe, Accessible Acetaminophen '500mg'$  2 daily prn Appropriate, Effective, Safe, Accessible  Allergic Reaction  -Not assessed today -Current treatment  Epipen 0.'3mg'$ /0.67m prn allergic rxn Appropriate, Effective, Safe, Accessible  Electrolyte Supplementation -Not assessed today -Current treatment  Magnesium Oxide '400mg'$  qd Appropriate, Effective, Safe, Accessible Potassium Chloride 10 mEq qd prn with lasix Appropriate, Effective, Safe, Accessible  QUERY GERD (Goal: minimize symptoms of reflux ) -Not assessed today -Current treatment  Omeprazole '20mg'$  1 BID Appropriate, Effective, Safe, Accessible  AMaren ReamerClinical Pharmacist [phone]

## 2022-05-07 ENCOUNTER — Telehealth: Payer: Self-pay

## 2022-05-07 NOTE — Progress Notes (Signed)
Patient ID: Timothy Wood, male   DOB: 04/25/1940, 82 y.o.   MRN: [id number] Care Management & Coordination Services Pharmacy Team  Reason for Encounter: Appointment Reminder  Contacted patient to confirm telephone appointment with Theo Dills, PharmD on 05/08/22 at 2. Spoke with patient on 05/07/2022    Care Gaps: AWV- 11/03/21 Zoster Vaccine - Overdue     Star Rating Drugs:  Irbesartan 150 mg - Last filled 04/09/22 90 DS at [facility] Rosuvastatin 20 mg - Last filled 04/01/22 90 DS at [facility] Pharmacist Assistant [phone]

## 2022-05-08 ENCOUNTER — Ambulatory Visit: Payer: Medicare PPO

## 2022-06-02 ENCOUNTER — Other Ambulatory Visit: Payer: Self-pay | Admitting: *Deleted

## 2022-06-02 DIAGNOSIS — R790 Abnormal level of blood mineral: Secondary | ICD-10-CM

## 2022-06-02 DIAGNOSIS — I10 Essential (primary) hypertension: Secondary | ICD-10-CM

## 2022-06-02 DIAGNOSIS — Z79899 Other long term (current) drug therapy: Secondary | ICD-10-CM

## 2022-06-03 ENCOUNTER — Other Ambulatory Visit: Payer: Self-pay

## 2022-06-03 ENCOUNTER — Emergency Department (HOSPITAL_COMMUNITY): Payer: Medicare PPO

## 2022-06-03 ENCOUNTER — Inpatient Hospital Stay (HOSPITAL_COMMUNITY)
Admission: EM | Admit: 2022-06-03 | Discharge: 2022-06-09 | DRG: 175 | Disposition: A | Discharge status: Skilled Nursing Facility | Payer: Medicare PPO | Attending: Internal Medicine | Admitting: Internal Medicine

## 2022-06-03 ENCOUNTER — Observation Stay (HOSPITAL_COMMUNITY): Payer: Medicare PPO

## 2022-06-03 ENCOUNTER — Encounter (HOSPITAL_COMMUNITY): Payer: Self-pay

## 2022-06-03 DIAGNOSIS — S32010D Wedge compression fracture of first lumbar vertebra, subsequent encounter for fracture with routine healing: Secondary | ICD-10-CM | POA: Diagnosis not present

## 2022-06-03 DIAGNOSIS — Z79899 Other long term (current) drug therapy: Secondary | ICD-10-CM

## 2022-06-03 DIAGNOSIS — I2699 Other pulmonary embolism without acute cor pulmonale: Principal | ICD-10-CM | POA: Diagnosis present

## 2022-06-03 DIAGNOSIS — Z789 Other specified health status: Secondary | ICD-10-CM | POA: Diagnosis not present

## 2022-06-03 DIAGNOSIS — Z7901 Long term (current) use of anticoagulants: Secondary | ICD-10-CM | POA: Diagnosis not present

## 2022-06-03 DIAGNOSIS — Z85118 Personal history of other malignant neoplasm of bronchus and lung: Secondary | ICD-10-CM | POA: Diagnosis not present

## 2022-06-03 DIAGNOSIS — Y92009 Unspecified place in unspecified non-institutional (private) residence as the place of occurrence of the external cause: Secondary | ICD-10-CM

## 2022-06-03 DIAGNOSIS — M4854XA Collapsed vertebra, not elsewhere classified, thoracic region, initial encounter for fracture: Secondary | ICD-10-CM | POA: Diagnosis present

## 2022-06-03 DIAGNOSIS — F17200 Nicotine dependence, unspecified, uncomplicated: Secondary | ICD-10-CM | POA: Diagnosis present

## 2022-06-03 DIAGNOSIS — Z801 Family history of malignant neoplasm of trachea, bronchus and lung: Secondary | ICD-10-CM

## 2022-06-03 DIAGNOSIS — Z800 Family history of malignant neoplasm of digestive organs: Secondary | ICD-10-CM

## 2022-06-03 DIAGNOSIS — Z8049 Family history of malignant neoplasm of other genital organs: Secondary | ICD-10-CM

## 2022-06-03 DIAGNOSIS — F1010 Alcohol abuse, uncomplicated: Secondary | ICD-10-CM | POA: Diagnosis present

## 2022-06-03 DIAGNOSIS — Z885 Allergy status to narcotic agent status: Secondary | ICD-10-CM

## 2022-06-03 DIAGNOSIS — E860 Dehydration: Secondary | ICD-10-CM | POA: Diagnosis present

## 2022-06-03 DIAGNOSIS — E876 Hypokalemia: Secondary | ICD-10-CM | POA: Diagnosis not present

## 2022-06-03 DIAGNOSIS — Z86711 Personal history of pulmonary embolism: Secondary | ICD-10-CM | POA: Diagnosis not present

## 2022-06-03 DIAGNOSIS — Z833 Family history of diabetes mellitus: Secondary | ICD-10-CM

## 2022-06-03 DIAGNOSIS — M4856XA Collapsed vertebra, not elsewhere classified, lumbar region, initial encounter for fracture: Secondary | ICD-10-CM | POA: Diagnosis present

## 2022-06-03 DIAGNOSIS — I82402 Acute embolism and thrombosis of unspecified deep veins of left lower extremity: Secondary | ICD-10-CM | POA: Diagnosis present

## 2022-06-03 DIAGNOSIS — I2510 Atherosclerotic heart disease of native coronary artery without angina pectoris: Secondary | ICD-10-CM | POA: Diagnosis present

## 2022-06-03 DIAGNOSIS — E782 Mixed hyperlipidemia: Secondary | ICD-10-CM | POA: Diagnosis present

## 2022-06-03 DIAGNOSIS — R0902 Hypoxemia: Secondary | ICD-10-CM | POA: Diagnosis not present

## 2022-06-03 DIAGNOSIS — I2609 Other pulmonary embolism with acute cor pulmonale: Secondary | ICD-10-CM | POA: Diagnosis not present

## 2022-06-03 DIAGNOSIS — Y906 Blood alcohol level of 120-199 mg/100 ml: Secondary | ICD-10-CM | POA: Diagnosis present

## 2022-06-03 DIAGNOSIS — Z83719 Family history of colon polyps, unspecified: Secondary | ICD-10-CM

## 2022-06-03 DIAGNOSIS — S32010A Wedge compression fracture of first lumbar vertebra, initial encounter for closed fracture: Secondary | ICD-10-CM | POA: Diagnosis not present

## 2022-06-03 DIAGNOSIS — R41841 Cognitive communication deficit: Secondary | ICD-10-CM | POA: Diagnosis not present

## 2022-06-03 DIAGNOSIS — F32A Depression, unspecified: Secondary | ICD-10-CM | POA: Diagnosis present

## 2022-06-03 DIAGNOSIS — F17210 Nicotine dependence, cigarettes, uncomplicated: Secondary | ICD-10-CM | POA: Diagnosis present

## 2022-06-03 DIAGNOSIS — Z91199 Patient's noncompliance with other medical treatment and regimen due to unspecified reason: Secondary | ICD-10-CM | POA: Diagnosis not present

## 2022-06-03 DIAGNOSIS — K219 Gastro-esophageal reflux disease without esophagitis: Secondary | ICD-10-CM | POA: Diagnosis present

## 2022-06-03 DIAGNOSIS — I2584 Coronary atherosclerosis due to calcified coronary lesion: Secondary | ICD-10-CM | POA: Diagnosis present

## 2022-06-03 DIAGNOSIS — D72829 Elevated white blood cell count, unspecified: Secondary | ICD-10-CM | POA: Diagnosis present

## 2022-06-03 DIAGNOSIS — Z7401 Bed confinement status: Secondary | ICD-10-CM | POA: Diagnosis not present

## 2022-06-03 DIAGNOSIS — W1830XA Fall on same level, unspecified, initial encounter: Secondary | ICD-10-CM | POA: Diagnosis present

## 2022-06-03 DIAGNOSIS — M545 Low back pain, unspecified: Secondary | ICD-10-CM | POA: Diagnosis not present

## 2022-06-03 DIAGNOSIS — W19XXXA Unspecified fall, initial encounter: Secondary | ICD-10-CM | POA: Diagnosis not present

## 2022-06-03 DIAGNOSIS — Z8261 Family history of arthritis: Secondary | ICD-10-CM

## 2022-06-03 DIAGNOSIS — I739 Peripheral vascular disease, unspecified: Secondary | ICD-10-CM | POA: Diagnosis present

## 2022-06-03 DIAGNOSIS — Z8249 Family history of ischemic heart disease and other diseases of the circulatory system: Secondary | ICD-10-CM

## 2022-06-03 DIAGNOSIS — Z86010 Personal history of colonic polyps: Secondary | ICD-10-CM

## 2022-06-03 DIAGNOSIS — J690 Pneumonitis due to inhalation of food and vomit: Secondary | ICD-10-CM | POA: Diagnosis not present

## 2022-06-03 DIAGNOSIS — J9601 Acute respiratory failure with hypoxia: Secondary | ICD-10-CM | POA: Diagnosis present

## 2022-06-03 DIAGNOSIS — J449 Chronic obstructive pulmonary disease, unspecified: Secondary | ICD-10-CM | POA: Diagnosis present

## 2022-06-03 DIAGNOSIS — R531 Weakness: Secondary | ICD-10-CM | POA: Diagnosis not present

## 2022-06-03 DIAGNOSIS — I10 Essential (primary) hypertension: Secondary | ICD-10-CM | POA: Diagnosis present

## 2022-06-03 DIAGNOSIS — M5136 Other intervertebral disc degeneration, lumbar region: Secondary | ICD-10-CM | POA: Diagnosis not present

## 2022-06-03 DIAGNOSIS — Z86718 Personal history of other venous thrombosis and embolism: Secondary | ICD-10-CM | POA: Diagnosis not present

## 2022-06-03 DIAGNOSIS — R079 Chest pain, unspecified: Secondary | ICD-10-CM | POA: Diagnosis not present

## 2022-06-03 DIAGNOSIS — Z9181 History of falling: Secondary | ICD-10-CM | POA: Diagnosis not present

## 2022-06-03 DIAGNOSIS — R2681 Unsteadiness on feet: Secondary | ICD-10-CM | POA: Diagnosis not present

## 2022-06-03 DIAGNOSIS — F1090 Alcohol use, unspecified, uncomplicated: Secondary | ICD-10-CM | POA: Diagnosis present

## 2022-06-03 DIAGNOSIS — Z8679 Personal history of other diseases of the circulatory system: Secondary | ICD-10-CM

## 2022-06-03 DIAGNOSIS — C3491 Malignant neoplasm of unspecified part of right bronchus or lung: Secondary | ICD-10-CM | POA: Diagnosis not present

## 2022-06-03 DIAGNOSIS — M546 Pain in thoracic spine: Secondary | ICD-10-CM | POA: Diagnosis not present

## 2022-06-03 DIAGNOSIS — Z955 Presence of coronary angioplasty implant and graft: Secondary | ICD-10-CM

## 2022-06-03 DIAGNOSIS — M4856XD Collapsed vertebra, not elsewhere classified, lumbar region, subsequent encounter for fracture with routine healing: Secondary | ICD-10-CM | POA: Diagnosis not present

## 2022-06-03 DIAGNOSIS — M6281 Muscle weakness (generalized): Secondary | ICD-10-CM | POA: Diagnosis not present

## 2022-06-03 DIAGNOSIS — Z743 Need for continuous supervision: Secondary | ICD-10-CM | POA: Diagnosis not present

## 2022-06-03 DIAGNOSIS — M549 Dorsalgia, unspecified: Secondary | ICD-10-CM | POA: Diagnosis not present

## 2022-06-03 LAB — CBC WITH DIFFERENTIAL/PLATELET
Abs Immature Granulocytes: 0.07 10*3/uL (ref 0.00–0.07)
Basophils Absolute: 0 10*3/uL (ref 0.0–0.1)
Basophils Relative: 0 %
Eosinophils Absolute: 0.1 10*3/uL (ref 0.0–0.5)
Eosinophils Relative: 0 %
HCT: 49.8 % (ref 39.0–52.0)
Hemoglobin: 16.2 g/dL (ref 13.0–17.0)
Immature Granulocytes: 1 %
Lymphocytes Relative: 8 %
Lymphs Abs: 1 10*3/uL (ref 0.7–4.0)
MCH: 31.2 pg (ref 26.0–34.0)
MCHC: 32.5 g/dL (ref 30.0–36.0)
MCV: 95.8 fL (ref 80.0–100.0)
Monocytes Absolute: 0.7 10*3/uL (ref 0.1–1.0)
Monocytes Relative: 6 %
Neutro Abs: 10.9 10*3/uL — ABNORMAL HIGH (ref 1.7–7.7)
Neutrophils Relative %: 85 %
Platelets: 221 10*3/uL (ref 150–400)
RBC: 5.2 MIL/uL (ref 4.22–5.81)
RDW: 13.4 % (ref 11.5–15.5)
WBC: 12.7 10*3/uL — ABNORMAL HIGH (ref 4.0–10.5)
nRBC: 0 % (ref 0.0–0.2)

## 2022-06-03 LAB — COMPREHENSIVE METABOLIC PANEL
ALT: 17 U/L (ref 0–44)
AST: 19 U/L (ref 15–41)
Albumin: 3.8 g/dL (ref 3.5–5.0)
Alkaline Phosphatase: 104 U/L (ref 38–126)
Anion gap: 14 (ref 5–15)
BUN: 11 mg/dL (ref 8–23)
CO2: 25 mmol/L (ref 22–32)
Calcium: 8 mg/dL — ABNORMAL LOW (ref 8.9–10.3)
Chloride: 102 mmol/L (ref 98–111)
Creatinine, Ser: 0.94 mg/dL (ref 0.61–1.24)
GFR, Estimated: >60 mL/min (ref >60)
Glucose, Bld: 102 mg/dL — ABNORMAL HIGH (ref 70–99)
Potassium: 3.5 mmol/L (ref 3.5–5.1)
Sodium: 141 mmol/L (ref 135–145)
Total Bilirubin: 0.7 mg/dL (ref 0.3–1.2)
Total Protein: 7.2 g/dL (ref 6.5–8.1)

## 2022-06-03 LAB — CK: Total CK: 110 U/L (ref 49–397)

## 2022-06-03 LAB — ETHANOL: Alcohol, Ethyl (B): 135 mg/dL — ABNORMAL HIGH (ref <10)

## 2022-06-03 MED ORDER — LORAZEPAM 1 MG PO TABS: 1.0000 mg | ORAL_TABLET | ORAL | Status: AC | PRN

## 2022-06-03 MED ORDER — ACETAMINOPHEN 650 MG RE SUPP: 650.0000 mg | Freq: Four times a day (QID) | RECTAL | Status: DC | PRN

## 2022-06-03 MED ORDER — CARVEDILOL 6.25 MG PO TABS
6.2500 mg | ORAL_TABLET | Freq: Two times a day (BID) | ORAL | Status: DC
  Administered 2022-06-03 – 2022-06-09 (×12): 6.25 mg via ORAL
  Filled 2022-06-03 (×12): qty 1

## 2022-06-03 MED ORDER — LORAZEPAM 1 MG PO TABS: 0.0000 mg | ORAL_TABLET | Freq: Two times a day (BID) | ORAL | Status: AC

## 2022-06-03 MED ORDER — FENTANYL CITRATE PF 50 MCG/ML IJ SOSY
50.0000 ug | PREFILLED_SYRINGE | Freq: Once | INTRAMUSCULAR | Status: AC
  Administered 2022-06-03: 50 ug via INTRAVENOUS
  Filled 2022-06-03: qty 1

## 2022-06-03 MED ORDER — DULOXETINE HCL 30 MG PO CPEP
30.0000 mg | ORAL_CAPSULE | Freq: Every day | ORAL | Status: DC
  Administered 2022-06-04 – 2022-06-09 (×6): 30 mg via ORAL
  Filled 2022-06-03 (×6): qty 1

## 2022-06-03 MED ORDER — SODIUM CHLORIDE 0.9% FLUSH
3.0000 mL | Freq: Two times a day (BID) | INTRAVENOUS | Status: DC
  Administered 2022-06-03 – 2022-06-09 (×13): 3 mL via INTRAVENOUS

## 2022-06-03 MED ORDER — AMLODIPINE BESYLATE 5 MG PO TABS
5.0000 mg | ORAL_TABLET | Freq: Every day | ORAL | Status: DC
  Administered 2022-06-04 – 2022-06-09 (×6): 5 mg via ORAL
  Filled 2022-06-03 (×6): qty 1

## 2022-06-03 MED ORDER — CALCIUM GLUCONATE-NACL 2-0.675 GM/100ML-% IV SOLN
2.0000 g | Freq: Once | INTRAVENOUS | Status: AC
  Administered 2022-06-03: 2000 mg via INTRAVENOUS
  Filled 2022-06-03: qty 100

## 2022-06-03 MED ORDER — IOHEXOL 350 MG/ML SOLN
75.0000 mL | Freq: Once | INTRAVENOUS | Status: AC | PRN
  Administered 2022-06-03: 75 mL via INTRAVENOUS

## 2022-06-03 MED ORDER — ONDANSETRON HCL 4 MG/2ML IJ SOLN: 4.0000 mg | Freq: Four times a day (QID) | INTRAMUSCULAR | Status: DC | PRN

## 2022-06-03 MED ORDER — ROSUVASTATIN CALCIUM 20 MG PO TABS
20.0000 mg | ORAL_TABLET | Freq: Every day | ORAL | Status: DC
  Administered 2022-06-04 – 2022-06-09 (×6): 20 mg via ORAL
  Filled 2022-06-03 (×6): qty 1

## 2022-06-03 MED ORDER — ALBUTEROL SULFATE (2.5 MG/3ML) 0.083% IN NEBU: 2.5000 mg | INHALATION_SOLUTION | Freq: Four times a day (QID) | RESPIRATORY_TRACT | Status: DC | PRN

## 2022-06-03 MED ORDER — RIVAROXABAN 10 MG PO TABS: 10.0000 mg | ORAL_TABLET | Freq: Every day | ORAL | Status: DC

## 2022-06-03 MED ORDER — LORAZEPAM 2 MG/ML IJ SOLN
1.0000 mg | INTRAMUSCULAR | Status: AC | PRN
  Administered 2022-06-03: 2 mg via INTRAVENOUS
  Filled 2022-06-03: qty 1

## 2022-06-03 MED ORDER — ADULT MULTIVITAMIN W/MINERALS CH
1.0000 | ORAL_TABLET | Freq: Every day | ORAL | Status: DC
  Administered 2022-06-03 – 2022-06-09 (×7): 1 via ORAL
  Filled 2022-06-03 (×7): qty 1

## 2022-06-03 MED ORDER — ACETAMINOPHEN 325 MG PO TABS: 650.0000 mg | ORAL_TABLET | Freq: Four times a day (QID) | ORAL | Status: DC | PRN

## 2022-06-03 MED ORDER — DICLOFENAC EPOLAMINE 1.3 % EX PTCH
1.0000 | MEDICATED_PATCH | Freq: Once | CUTANEOUS | Status: AC
  Administered 2022-06-03: 1 via TRANSDERMAL
  Filled 2022-06-03: qty 1

## 2022-06-03 MED ORDER — FOLIC ACID 1 MG PO TABS
1.0000 mg | ORAL_TABLET | Freq: Every day | ORAL | Status: DC
  Administered 2022-06-03 – 2022-06-09 (×7): 1 mg via ORAL
  Filled 2022-06-03 (×7): qty 1

## 2022-06-03 MED ORDER — ONDANSETRON HCL 4 MG PO TABS: 4.0000 mg | ORAL_TABLET | Freq: Four times a day (QID) | ORAL | Status: DC | PRN

## 2022-06-03 MED ORDER — SODIUM CHLORIDE 0.9 % IV BOLUS
1000.0000 mL | Freq: Once | INTRAVENOUS | Status: AC
  Administered 2022-06-03: 1000 mL via INTRAVENOUS

## 2022-06-03 MED ORDER — THIAMINE HCL 100 MG/ML IJ SOLN
100.0000 mg | Freq: Every day | INTRAMUSCULAR | Status: DC
  Filled 2022-06-03: qty 2

## 2022-06-03 MED ORDER — OXYCODONE HCL 5 MG PO TABS
5.0000 mg | ORAL_TABLET | ORAL | Status: DC | PRN
  Administered 2022-06-03 – 2022-06-09 (×8): 5 mg via ORAL
  Filled 2022-06-03 (×8): qty 1

## 2022-06-03 MED ORDER — NICOTINE 14 MG/24HR TD PT24
14.0000 mg | MEDICATED_PATCH | Freq: Every day | TRANSDERMAL | Status: DC
  Administered 2022-06-03 – 2022-06-04 (×2): 14 mg via TRANSDERMAL
  Filled 2022-06-03 (×2): qty 1

## 2022-06-03 MED ORDER — METHOCARBAMOL 500 MG PO TABS
500.0000 mg | ORAL_TABLET | Freq: Three times a day (TID) | ORAL | Status: DC | PRN
  Administered 2022-06-04 – 2022-06-05 (×2): 500 mg via ORAL
  Filled 2022-06-03 (×2): qty 1

## 2022-06-03 MED ORDER — IRBESARTAN 300 MG PO TABS
150.0000 mg | ORAL_TABLET | Freq: Every day | ORAL | Status: DC
  Administered 2022-06-04 – 2022-06-09 (×6): 150 mg via ORAL
  Filled 2022-06-03 (×6): qty 1

## 2022-06-03 MED ORDER — MAGNESIUM SULFATE IN D5W 1-5 GM/100ML-% IV SOLN
1.0000 g | Freq: Once | INTRAVENOUS | Status: AC
  Administered 2022-06-04: 1 g via INTRAVENOUS
  Filled 2022-06-03: qty 100

## 2022-06-03 MED ORDER — THIAMINE MONONITRATE 100 MG PO TABS
100.0000 mg | ORAL_TABLET | Freq: Every day | ORAL | Status: DC
  Administered 2022-06-03 – 2022-06-09 (×7): 100 mg via ORAL
  Filled 2022-06-03 (×7): qty 1

## 2022-06-03 MED ORDER — LORAZEPAM 1 MG PO TABS
0.0000 mg | ORAL_TABLET | Freq: Four times a day (QID) | ORAL | Status: AC
  Administered 2022-06-03: 2 mg via ORAL
  Filled 2022-06-03: qty 2

## 2022-06-03 MED ORDER — PANTOPRAZOLE SODIUM 40 MG PO TBEC
40.0000 mg | DELAYED_RELEASE_TABLET | Freq: Every day | ORAL | Status: DC
  Administered 2022-06-04 – 2022-06-09 (×6): 40 mg via ORAL
  Filled 2022-06-03 (×6): qty 1

## 2022-06-03 NOTE — Progress Notes (Signed)
Orthopedic Tech Progress Note Patient Details:  Timothy Wood 1940/08/11 [id number]  Patient ID: Timothy Wood, male   DOB: 12-Jun-1940, 82 y.o.   MRN: [id number] TLSO will be delivered an applied once discharge instructions are clear. There are different braces for admitted and discharged patients. Vernona Rieger 06/03/2022, 11:33 AM

## 2022-06-03 NOTE — Progress Notes (Signed)
Orthopedic Tech Progress Note Patient Details:  Timothy Wood 01-Mar-1941 [id number] TLSO was fitted and left at bedside due to the order only being for OOB. Application of the brace was also demonstrated for the patient.  Ortho Devices Type of Ortho Device: Thoracolumbar corset (TLSO) Ortho Device/Splint Interventions: Ordered, Adjustment   Post Interventions Instructions Provided: Adjustment of device, Care of device  Robyn Nohr E Ayra Hodgdon 06/03/2022, 12:47 PM

## 2022-06-03 NOTE — ED Notes (Signed)
ED TO INPATIENT HANDOFF REPORT  ED Nurse Name and Phone #: cori [phone]  S Name/Age/Gender Timothy Wood 82 y.o. male Room/Bed: 024C/024C  Code Status   Code Status: Full Code  Home/SNF/Other Home Patient oriented to: self, place, time, and situation Is this baseline? Yes   Triage Complete: Triage complete  Chief Complaint Acute respiratory failure with hypoxia [facility]) [J96.01]  Triage Note Pt to the ED with a CC Fall with back pain. Pt fell last outside while feeding the cats. Pt was then able craw to the living room and sat in his chair until he called EMS this morning. Pt relays sever back pain with out numbness or tingling. Pt denies blood thinner or hitting head.   Allergies Allergies  Allergen Reactions   Codeine Anaphylaxis   Hydrocodone Itching    Level of Care/Admitting Diagnosis ED Disposition     ED Disposition  Admit   Condition  --   Comment  Hospital Area: [place] [[id number]]  Level of Care: Telemetry Medical [104]  May place patient in observation at [facility] or [place] if equivalent level of care is available:: No  Covid Evaluation: Asymptomatic - no recent exposure (last 10 days) testing not required  Diagnosis: Acute respiratory failure with hypoxia [facility]) [id number]  Admitting Physician: Norval Morton [id number]  Attending Physician: Norval Morton [id number]          B Medical/Surgery History Past Medical History:  Diagnosis Date   Adenocarcinoma of lung, stage 1 ([place])    Anemia    Angiodysplasia of cecum 11/23/2017   Multiple 1-2 mm   Arthritis    back possibly   Blood clotting disorder (HCC)    Cataract    removed bilaterally    Chronic obstructive pulmonary disease (COPD) ([place])    "patient not aware"   Clotting disorder ([place])    PE LUNG, dvt leg    Colon polyps    Depression    Diverticulosis    DVT (deep venous thrombosis) ([place])    Post op  knee surgery   DVT (deep venous thrombosis) (HCC)     GERD (gastroesophageal reflux disease)    Histoplasmosis    Histoplasmosis with pneumonia ([place]) 04/01/2015   Hypercholesteremia    Hyperlipidemia    Hypertension    Laceration of head 08/05/2015   Lumbar compression fracture ([place])    Lung cancer ([place]) dx'd 2016   Personal history of colonic polyps 2008   multiple adenomas 2008-2009 exams with large cecal adenoma   Pneumonia    in history   Polyposis coli - attenuated 09/12/2010   2008: adenomas with 9 polyps index and 2.5 cm cecal polyp removed over 3 exams, last 2009 with APC. diverticulosis also. 11/24/2010: 12 polyps removed - adenomas Anticipate routine repeat colonoscopy Spring 2013 Have recommended he see genetics counselor re ? Polyposis 03/27/2016 14 polyps removed max 15 mm    Pulmonary nodule 06/2006   9 mm right upper obe pulmonary nodule (negative Pet Scan)   PVD (peripheral vascular disease) (HCC)    left leg s/p stent   Sebaceous cyst    neck   Seborrheic keratosis    Seizures (HCC)    4-5 yrs ago had seizures when in ICU- none since this time    Sinusitis    Tobacco abuse    Tremor    Past Surgical History:  Procedure Laterality Date   ANGIOPLASTY / STENTING ILIAC  08/2008   Dr. Trula Slade  [place]   x2 lumbar fusion over 30 years ago   BACK SURGERY     COLONOSCOPY  05/11/2006; 07/27/2006;05/05/2007, 11/24/2010   adenomas with 9 polyps index and 2.5 cm cecal polyp removed over 3 exams, last 2009 with APC. diverticulosis also.  2012: 12 adenomas removed, largest 8-10 mm, diverticulosis   CORONARY ANGIOPLASTY WITH STENT PLACEMENT     DESCENDING AORTIC ANEURYSM REPAIR W/ STENT  04/2010   Dr. Trula Slade   ESOPHAGOGASTRODUODENOSCOPY  05/05/2007   GERD - one erosion   EYE SURGERY Bilateral    cataract   KNEE SURGERY  2002   left- cartilage    LUNG BIOPSY     PERCUTANEOUS PINNING Left 06/24/2012   Procedure: Repair Complex Laceration Left Thumb with percutaneous pinning.;  Surgeon: Dennie Bible, MD;  Location:  [facility];  Service: Plastics;  Laterality: Left;   THUMB AMPUTATION Left    pinning and index finger   TONSILLECTOMY  1956   TONSILLECTOMY     UPPER GASTROINTESTINAL ENDOSCOPY     VIDEO ASSISTED THORACOSCOPY (VATS)/WEDGE RESECTION Right 01/24/2015   Procedure: RIGHT VIDEO ASSISTED THORACOSCOPY (VATS)/WEDGE RESECTION;  Surgeon: [name], MD;  Location: [facility];  Service: Thoracic;  Laterality: Right;   vocal cord polyps removed       A IV Location/Drains/Wounds Patient Lines/Drains/Airways Status     Active Line/Drains/Airways     Name Placement date Placement time Site Days   Peripheral IV 06/03/22 20 G 1" Right Antecubital 06/03/22  0900  Antecubital  less than 1   Wound / Incision (Open or Dehisced) 08/06/15 Other (Comment) Elbow Right;Posterior covered with foam  08/06/15  0029  Elbow  2493   Wound / Incision (Open or Dehisced) 08/06/15 Laceration Head Right;Lateral 08/06/15  0032  Head  2493   Wound / Incision (Open or Dehisced) 08/06/15 Laceration Hand Right stitches 08/06/15  0036  Hand  2493            Intake/Output Last 24 hours  Intake/Output Summary (Last 24 hours) at 06/03/2022 1344 Last data filed at 06/03/2022 1030 Gross per 24 hour  Intake 1000 ml  Output --  Net 1000 ml    Labs/Imaging Results for orders placed or performed during the hospital encounter of 06/03/22 (from the past 48 hour(s))  Comprehensive metabolic panel     Status: Abnormal   Collection Time: 06/03/22  8:25 AM  Result Value Ref Range   Sodium 141 135 - 145 mmol/L   Potassium 3.5 3.5 - 5.1 mmol/L   Chloride 102 98 - 111 mmol/L   CO2 25 22 - 32 mmol/L   Glucose, Bld 102 (H) 70 - 99 mg/dL    Comment: Glucose reference range applies only to samples taken after fasting for at least 8 hours.   BUN 11 8 - 23 mg/dL   Creatinine, Ser 0.94 0.61 - 1.24 mg/dL   Calcium 8.0 (L) 8.9 - 10.3 mg/dL   Total Protein 7.2 6.5 - 8.1 g/dL   Albumin 3.8 3.5 - 5.0 g/dL   AST 19 15 - 41 U/L   ALT  17 0 - 44 U/L   Alkaline Phosphatase 104 38 - 126 U/L   Total Bilirubin 0.7 0.3 - 1.2 mg/dL   GFR, Estimated >60 >60 mL/min    Comment: (NOTE) Calculated using the CKD-EPI Creatinine Equation (2021)    Anion gap 14 5 - 15    Comment: Performed at [facility]  [address] [place], [place] [address]  Ethanol     Status: Abnormal   Collection Time: 06/03/22  8:25 AM  Result Value Ref Range   Alcohol, Ethyl (B) 135 (H) <10 mg/dL    Comment: (NOTE) Lowest detectable limit for serum alcohol is 10 mg/dL.  For medical purposes only. Performed at [facility], [place] [address] [place], [place] [address]   CBC with Differential     Status: Abnormal   Collection Time: 06/03/22  8:25 AM  Result Value Ref Range   WBC 12.7 (H) 4.0 - 10.5 K/uL   RBC 5.20 4.22 - 5.81 MIL/uL   Hemoglobin 16.2 13.0 - 17.0 g/dL   HCT 49.8 39.0 - 52.0 %   MCV 95.8 80.0 - 100.0 fL   MCH 31.2 26.0 - 34.0 pg   MCHC 32.5 30.0 - 36.0 g/dL   RDW 13.4 11.5 - 15.5 %   Platelets 221 150 - 400 K/uL   nRBC 0.0 0.0 - 0.2 %   Neutrophils Relative % 85 %   Neutro Abs 10.9 (H) 1.7 - 7.7 K/uL   Lymphocytes Relative 8 %   Lymphs Abs 1.0 0.7 - 4.0 K/uL   Monocytes Relative 6 %   Monocytes Absolute 0.7 0.1 - 1.0 K/uL   Eosinophils Relative 0 %   Eosinophils Absolute 0.1 0.0 - 0.5 K/uL   Basophils Relative 0 %   Basophils Absolute 0.0 0.0 - 0.1 K/uL   Immature Granulocytes 1 %   Abs Immature Granulocytes 0.07 0.00 - 0.07 K/uL    Comment: Performed at [facility] [address] [place], [place] [address]  CK     Status: None   Collection Time: 06/03/22  8:25 AM  Result Value Ref Range   Total CK 110 49 - 397 U/L    Comment: Performed at [facility], [place] [address], [place], [place] [address]   DG Chest 1 View  Result Date: 06/03/2022 CLINICAL DATA:  Pain after fall EXAM: CHEST  1 VIEW COMPARISON:  08/12/2018 x-ray.  CT 03/19/2022 FINDINGS: There is some scarring atelectatic changes in the  right mid to lower lung. Surgical changes noted on the prior CT scan. No consolidation, pneumothorax or effusion. No edema. Normal cardiopericardial silhouette. Overlapping cardiac leads IMPRESSION: No acute cardiopulmonary disease. Electronically Signed   By: Jill Side M.D.   On: 06/03/2022 12:06   DG Lumbar Spine Complete  Result Date: 06/03/2022 CLINICAL DATA:  fall upper lumbar / lower thoracic pain; 190176 Fall 190176 EXAM: LUMBAR SPINE - COMPLETE 4+ VIEW; THORACIC SPINE 2 VIEWS COMPARISON:  CT chest 03/19/2022, x-ray lumbar spine 10/23/2019 FINDINGS: Limited evaluation due to overlapping osseous structures and overlying soft tissues. There is no evidence of thoracic spine fracture. Chronic T3 superior endplate vertebral body height loss not well visualized due to overlying osseous structures. Interval development of an age-indeterminate L1 superior endplate compression fracture with at least 40% vertebral body height loss. Chronic stable L2 through L3 superior endplate compression fractures. Multilevel mild degenerative changes of the thoracic spine. Multilevel moderate degenerative changes of the lumbar spine with associated moderate severe intervertebral disc space narrowing at the L4-L5 L5-S1 levels. Alignment is normal. Atherosclerotic plaque. Aortoiliac stent. IMPRESSION: 1. Interval development of an age-indeterminate L1 superior endplate compression fracture with at least 40% vertebral body height loss. 2. No acute displaced fracture or traumatic listhesis of the thoracic spine. 3. Chronic T3 superior endplate vertebral body height loss not well visualized due to  overlying osseous structures. 4. Chronic stable L2 through L3 superior endplate compression fractures. 5. Limited evaluation due to overlapping osseous structures and overlying soft tissues. 6.  Aortic Atherosclerosis (ICD10-I70.0). Aortoiliac stent. Electronically Signed   By: Iven Finn M.D.   On: 06/03/2022 09:39   DG Thoracic  Spine 2 View  Result Date: 06/03/2022 CLINICAL DATA:  fall upper lumbar / lower thoracic pain; 190176 Fall 190176 EXAM: LUMBAR SPINE - COMPLETE 4+ VIEW; THORACIC SPINE 2 VIEWS COMPARISON:  CT chest 03/19/2022, x-ray lumbar spine 10/23/2019 FINDINGS: Limited evaluation due to overlapping osseous structures and overlying soft tissues. There is no evidence of thoracic spine fracture. Chronic T3 superior endplate vertebral body height loss not well visualized due to overlying osseous structures. Interval development of an age-indeterminate L1 superior endplate compression fracture with at least 40% vertebral body height loss. Chronic stable L2 through L3 superior endplate compression fractures. Multilevel mild degenerative changes of the thoracic spine. Multilevel moderate degenerative changes of the lumbar spine with associated moderate severe intervertebral disc space narrowing at the L4-L5 L5-S1 levels. Alignment is normal. Atherosclerotic plaque. Aortoiliac stent. IMPRESSION: 1. Interval development of an age-indeterminate L1 superior endplate compression fracture with at least 40% vertebral body height loss. 2. No acute displaced fracture or traumatic listhesis of the thoracic spine. 3. Chronic T3 superior endplate vertebral body height loss not well visualized due to overlying osseous structures. 4. Chronic stable L2 through L3 superior endplate compression fractures. 5. Limited evaluation due to overlapping osseous structures and overlying soft tissues. 6.  Aortic Atherosclerosis (ICD10-I70.0). Aortoiliac stent. Electronically Signed   By: Iven Finn M.D.   On: 06/03/2022 09:39    Pending Labs Unresulted Labs (From admission, onward)     Start     Ordered   06/04/22 0500  CBC  Tomorrow morning,   R        06/03/22 1338   06/04/22 [id number]  Basic metabolic panel  Tomorrow morning,   R        06/03/22 1338            Vitals/Pain Today's Vitals   06/03/22 1120 06/03/22 1130 06/03/22 1232  06/03/22 1314  BP:  (!) 173/103    Pulse: (!) 108 (!) 110    Resp: 17 19    Temp:   98 F (36.7 C)   TempSrc:   Oral   SpO2: 94% 92%    Weight:      Height:      PainSc:    9     Isolation Precautions No active isolations  Medications Medications  diclofenac (FLECTOR) 1.3 % 1 patch (1 patch Transdermal Patch Applied 06/03/22 1000)  sodium chloride flush (NS) 0.9 % injection 3 mL (has no administration in time range)  acetaminophen (TYLENOL) tablet 650 mg (has no administration in time range)    Or  acetaminophen (TYLENOL) suppository 650 mg (has no administration in time range)  oxyCODONE (Oxy IR/ROXICODONE) immediate release tablet 5 mg (has no administration in time range)  ondansetron (ZOFRAN) tablet 4 mg (has no administration in time range)    Or  ondansetron (ZOFRAN) injection 4 mg (has no administration in time range)  albuterol (PROVENTIL) (2.5 MG/3ML) 0.083% nebulizer solution 2.5 mg (has no administration in time range)  fentaNYL (SUBLIMAZE) injection 50 mcg (50 mcg Intravenous Given 06/03/22 0828)  sodium chloride 0.9 % bolus 1,000 mL (0 mLs Intravenous Stopped 06/03/22 1030)  fentaNYL (SUBLIMAZE) injection 50 mcg (50 mcg Intravenous Given 06/03/22 1311)  Mobility walks     Focused Assessments Pulmonary Assessment Handoff:  Lung sounds:   O2 Device: Nasal Cannula O2 Flow Rate (L/min): 3 L/min    R Recommendations: See Admitting Provider Note  Report given to:   Additional Notes: patient presented with c/o fall last night and diagnosed with compression fractures, but is admitting due to hypoxia. He is a smoker, hx COPD. Sating low 80s on room air, 70s when sleeping, and does not have supplemental O2 at home.

## 2022-06-03 NOTE — ED Triage Notes (Signed)
Pt to the ED with a CC Fall with back pain. Pt fell last outside while feeding the cats. Pt was then able craw to the living room and sat in his chair until he called EMS this morning. Pt relays sever back pain with out numbness or tingling. Pt denies blood thinner or hitting head.

## 2022-06-03 NOTE — Evaluation (Signed)
Physical Therapy Evaluation Patient Details Name: Timothy Wood MRN: [id number] DOB: 1940-04-03 Today's Date: 06/03/2022  History of Present Illness  Pt is a 82 y.o. M who presents 06/02/2021 after a fall with new oxygen requirement and mild L1 compression fracture and chronic, healed fractures. Significant PMH: lung CA, COPD, back pain.  Clinical Impression  PTA, pt lives alone and is independent; reports he is a limited community ambulator due to history of chronic back pain. Pt presents with decreased functional mobility secondary to back pain, weakness, and decreased respiratory status. Pt instructed on log roll technique and requiring moderate assist for bed mobility. Unable to sit up on edge of bed > 5 seconds and shaking in pain. Reports minimal to no pain in supine position and denies radicular symptoms or numbness/tingling. BLE's with at least anti gravity strength. Will trial out of bed mobility again tomorrow; discussed with MD regarding pain medication regimen.      Recommendations for follow up therapy are one component of a multi-disciplinary discharge planning process, led by the attending physician.  Recommendations may be updated based on patient status, additional functional criteria and insurance authorization.  Follow Up Recommendations Skilled nursing-short term rehab (<3 hours/day) (if significantly improved pain control, may progress home) Can patient physically be transported by private vehicle: No    Assistance Recommended at Discharge Frequent or constant Supervision/Assistance  Patient can return home with the following  Two people to help with walking and/or transfers;Two people to help with bathing/dressing/bathroom    Equipment Recommendations Other (comment) (TBA)  Recommendations for Other Services       Functional Status Assessment Patient has had a recent decline in their functional status and demonstrates the ability to make significant improvements in  function in a reasonable and predictable amount of time.     Precautions / Restrictions Precautions Precautions: Back;Fall Precaution Booklet Issued: No Precaution Comments: Back precautions for comfort Required Braces or Orthoses: Spinal Brace Spinal Brace: Thoracolumbosacral orthotic;Applied in sitting position Restrictions Weight Bearing Restrictions: No      Mobility  Bed Mobility Overal bed mobility: Needs Assistance Bed Mobility: Rolling, Sidelying to Sit Rolling: Mod assist Sidelying to sit: Mod assist       General bed mobility comments: Cues for log roll technique and use of rail, assist for trunk elevation to upright. pt returning quickly to supine due to pain    Transfers                        Ambulation/Gait                  Stairs            Wheelchair Mobility    Modified Rankin (Stroke Patients Only)       Balance Overall balance assessment: Needs assistance Sitting-balance support: Feet supported Sitting balance-Leahy Scale: Poor Sitting balance - Comments: reliant on UE support due to pain                                     Pertinent Vitals/Pain Pain Assessment Pain Assessment: Faces Faces Pain Scale: Hurts worst Pain Location: Sharp low back pain with sitting up on edge of bed Pain Descriptors / Indicators: Guarding, Sharp, Other (Comment) (pt shaking) Pain Intervention(s): Limited activity within patient's tolerance, Monitored during session, Repositioned    Home Living Family/patient expects to be discharged to:: Private residence  Living Arrangements: Alone Available Help at Discharge: Family;Available PRN/intermittently Type of Home: House Home Access: Stairs to enter   [employer] of Steps: 1 ((half step))   Home Layout: One level (ranch style) Home Equipment: Conservation officer, nature (2 wheels)      Prior Function Prior Level of Function : Independent/Modified Independent;Driving                      Hand Dominance        Extremity/Trunk Assessment   Upper Extremity Assessment Upper Extremity Assessment: Defer to OT evaluation    Lower Extremity Assessment Lower Extremity Assessment: RLE deficits/detail;LLE deficits/detail RLE Deficits / Details: At least 3/5 strength LLE Deficits / Details: At least 3/5 strength    Cervical / Trunk Assessment Cervical / Trunk Assessment: Other exceptions Cervical / Trunk Exceptions: L1 compression fx  Communication   Communication: No difficulties  Cognition Arousal/Alertness: Awake/alert Behavior During Therapy: WFL for tasks assessed/performed Overall Cognitive Status: Within Functional Limits for tasks assessed                                          General Comments      Exercises     Assessment/Plan    PT Assessment Patient needs continued PT services  PT Problem List Decreased strength;Decreased activity tolerance;Decreased balance;Decreased mobility;Pain       PT Treatment Interventions Gait training;DME instruction;Stair training;Functional mobility training;Therapeutic activities;Therapeutic exercise;Balance training;Patient/family education    PT Goals (Current goals can be found in the Care Plan section)  Acute Rehab PT Goals Patient Stated Goal: less pain PT Goal Formulation: With patient Time For Goal Achievement: 06/17/22 Potential to Achieve Goals: Good    Frequency Min 5X/week     Co-evaluation               AM-PAC PT "6 Clicks" Mobility  Outcome Measure Help needed turning from your back to your side while in a flat bed without using bedrails?: A Lot Help needed moving from lying on your back to sitting on the side of a flat bed without using bedrails?: A Lot Help needed moving to and from a bed to a chair (including a wheelchair)?: A Lot Help needed standing up from a chair using your arms (e.g., wheelchair or bedside chair)?: A Lot Help needed to  walk in hospital room?: Total Help needed climbing 3-5 steps with a railing? : Total 6 Click Score: 10    End of Session Equipment Utilized During Treatment: Oxygen Activity Tolerance: Patient limited by pain Patient left: in bed;with call bell/phone within reach;with bed alarm set Nurse Communication: Mobility status PT Visit Diagnosis: Pain;Difficulty in walking, not elsewhere classified (R26.2) Pain - part of body:  (back)    Time: 1552-1610 PT Time Calculation (min) (ACUTE ONLY): 18 min   Charges:   PT Evaluation $PT Eval Moderate Complexity: 1 Mod          Wyona Almas, PT, DPT Acute Rehabilitation Services Office [phone]   Deno Etienne 06/03/2022, 4:39 PM

## 2022-06-03 NOTE — Progress Notes (Signed)
Neurosurgery  82 yo M with COPD s/p fall with back pain, mild L1 compression fracture, presumed acute. Numerous healed chronic fractures.  No surgical intervention indicated.  Recommend TLSO brace when OOB, no lifting > 10-15 lbs, can f/u in clinic in 2-4 weeks with an x-ray.

## 2022-06-03 NOTE — H&P (Addendum)
History and Physical    Patient: Timothy Wood [id number] DOB: 1940-11-03 DOA: 06/03/2022 DOS: the patient was seen and examined on 06/03/2022 PCP: [name], [name], MD  Patient coming from: Home via EMS  Chief Complaint:  Chief Complaint  Patient presents with   Fall   HPI: Timothy Wood is a 82 y.o. male with medical history significant of history of PE/DVT on Xarelto, adenocarcinoma of the lung in 2016 s/p right VATS with wedge restriction of the right middle lobe and upper lobe, alcohol use, tobacco abuse, and GERD who presents after having a fall.  He had been feeding his 7 feral cats yesterday evening around 10 PM and was walking backwards into the house when he thinks he lost his footing on the steps into his home causing him to fall.  He fell onto his back.  Denied any trauma to his head or loss of consciousness.  After the fall he complained of having severe pain in his lower back.  He lives alone and was not able to get a phone call for help until around 9 AM this morning.   he admits to having drank a half of a pint of brandy yesterday before the incident, but reports that he does not does not drink on a daily basis.  In the emergency department patient was noted to be afebrile with O2 saturations noted to be as low as 79% on room with improvement on 3 L nasal cannula oxygen currently 94%.  Labs significant for WBC 12.7.  X-rays of the lumbar and thoracic spine revealed interval development of age-indeterminate L1 superior endplate compression fracture with at least 40% vertebral height loss.  Chest x-ray had not noted any acute abnormality.  Neurosurgery had been consulted.  Orders have been placed for patient to be placed in TLSO brace.  Patient had been given fentanyl 50 mg IV x 2, 1 L normal saline IV fluids, and a diclofenac patch. Review of Systems: As mentioned in the history of present illness. All other systems reviewed and are negative. Past Medical History:  Diagnosis  Date   Adenocarcinoma of lung, stage 1 ([place])    Anemia    Angiodysplasia of cecum 11/23/2017   Multiple 1-2 mm   Arthritis    back possibly   Blood clotting disorder (HCC)    Cataract    removed bilaterally    Chronic obstructive pulmonary disease (COPD) ([place])    "patient not aware"   Clotting disorder ([place])    PE LUNG, dvt leg    Colon polyps    Depression    Diverticulosis    DVT (deep venous thrombosis) ([place])    Post op  knee surgery   DVT (deep venous thrombosis) (HCC)    GERD (gastroesophageal reflux disease)    Histoplasmosis    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 diverticulosis also. 11/24/2010: 12 polyps removed - adenomas Anticipate routine repeat colonoscopy Spring 2013 Have recommended he see genetics counselor re ? Polyposis 03/27/2016 14  polyps removed max 15 mm    Pulmonary nodule 06/2006   9 mm right upper obe pulmonary nodule (negative Pet Scan)   PVD (peripheral vascular disease) (HCC)    left leg s/p stent   Sebaceous cyst    neck   Seborrheic keratosis    Seizures ([place])    4-5 yrs ago had seizures when in ICU- none since this time    Sinusitis    Tobacco abuse    Tremor    Past Surgical History:  Procedure Laterality Date   ANGIOPLASTY / STENTING ILIAC  08/2008   Dr. Gray Bernhardt SURGERY  1975, 1978   x2 lumbar fusion over 30 years ago   BACK SURGERY     COLONOSCOPY  05/11/2006; 07/27/2006;05/05/2007, 11/24/2010   adenomas with 9 polyps index and 2.5 cm cecal polyp removed over 3 exams, last 2009 with APC. diverticulosis  also.  2012: 12 adenomas removed, largest 8-10 mm, diverticulosis   CORONARY ANGIOPLASTY WITH STENT PLACEMENT     DESCENDING AORTIC ANEURYSM REPAIR W/ STENT  04/2010   Dr. Trula Slade   ESOPHAGOGASTRODUODENOSCOPY  05/05/2007   GERD - one erosion   EYE SURGERY Bilateral    cataract   KNEE SURGERY  2002   left- cartilage    LUNG BIOPSY     PERCUTANEOUS PINNING Left 06/24/2012   Procedure: Repair Complex Laceration Left Thumb with percutaneous pinning.;  Surgeon: Dennie Bible, MD;  Location: [facility];  Service: Plastics;  Laterality: Left;   THUMB AMPUTATION Left    pinning and index finger   TONSILLECTOMY  1956   TONSILLECTOMY     UPPER GASTROINTESTINAL ENDOSCOPY     VIDEO ASSISTED THORACOSCOPY (VATS)/WEDGE RESECTION Right 01/24/2015   Procedure: RIGHT VIDEO ASSISTED THORACOSCOPY (VATS)/WEDGE RESECTION;  Surgeon: [name], MD;  Location: [facility];  Service: Thoracic;  Laterality: Right;   vocal cord polyps removed     Social History:  reports that he has been smoking cigarettes. He has a 60.00 pack-year smoking history. He has never used smokeless tobacco. He reports current alcohol use. He reports that he does not use drugs.  Allergies  Allergen Reactions   Codeine Anaphylaxis   Hydrocodone Itching    Family History  Problem Relation Age of Onset   Colon cancer Mother 16       multiple colon polyps   Deep vein thrombosis Mother    Cancer Mother 55       uterine cancer   Uterine cancer Mother    Lung cancer Mother    Lung cancer Son        smoked and was a welder   Heart disease Brother    Diabetes Other    Heart disease Brother    Rheum arthritis Daughter    Rheum arthritis Sister    Colon polyps Neg Hx    Stomach cancer Neg Hx    Rectal cancer Neg Hx     Prior to Admission medications   Medication Sig Start Date End Date Taking? Authorizing Provider  Acetaminophen 500 MG capsule Take 1,000 mg by mouth daily as needed for fever.    [provider]   amLODipine (NORVASC) 5 MG tablet Take 1 tablet (5 mg total) by mouth daily. 04/20/22   Donato Heinz, MD  carvedilol (COREG) 6.25 MG tablet Take 1 tablet (6.25 mg total) by mouth 2 (two) times daily. 04/29/22   Donato Heinz, MD  DULoxetine (CYMBALTA) 30 MG capsule TAKE ONE  CAPSULE BY MOUTH DAILY 06/23/21   [name], [name], MD  EPINEPHrine (EPIPEN 2-PAK) 0.3 mg/0.3 mL IJ SOAJ injection To use daily if needed for severe allergy reaction. 08/20/21   [name], [name], MD  furosemide (LASIX) 20 MG tablet Take 1 tablet (20 mg total) by mouth daily as needed (for weight increase of 3 lbs overnight or 5 lbs in 1 week). 04/22/22   Donato Heinz, MD  irbesartan (AVAPRO) 150 MG tablet TAKE ONE TABLET BY MOUTH DAILY 12/15/21   [name], [name], MD  magnesium oxide (MAG-OX) 400 MG tablet Take 1 tablet (400 mg total) by mouth daily. For 3 days 04/22/22   Donato Heinz, MD  omeprazole (PRILOSEC) 20 MG capsule TAKE ONE CAPSULE BY MOUTH TWICE A DAY BEFORE MEALS Patient taking differently: Take 20 mg by mouth daily. 12/04/21   [name], [name], MD  potassium chloride (KLOR-CON) 10 MEQ tablet Take 1 tablet (10 mEq total) by mouth daily as needed (with lasix). 04/22/22 08/20/22  Donato Heinz, MD  rivaroxaban (XARELTO) 10 MG TABS tablet Take 1 tablet (10 mg total) by mouth daily. 10/13/21   [name], [name], MD  rosuvastatin (CRESTOR) 20 MG tablet TAKE ONE TABLET BY MOUTH DAILY 05/27/21   [name], [name], MD  tiZANidine (ZANAFLEX) 2 MG tablet TAKE ONE TABLET BY MOUTH TWICE A DAY AS NEEDED 02/18/22   [name], [name], MD    Physical Exam: Vitals:   06/03/22 1100 06/03/22 1120 06/03/22 1130 06/03/22 1232  BP: (!) 159/94  (!) 173/103   Pulse: (!) 108 (!) 108 (!) 110   Resp: 20 17 19    Temp:    98 F (36.7 C)  TempSrc:    Oral  SpO2: 94% 94% 92%   Weight:      Height:       Constitutional: Elderly male who appears to be in some distress with intermittent cough Eyes: PERRL, lids and  conjunctivae normal ENMT: Mucous membranes are moist.  Neck: normal, supple Respiratory: Normal respiratory effort with prolonged exhalation.  No significant wheezes or rhonchi appreciated.  Patient on 2 L of oxygen with O2 saturation maintained currently. Cardiovascular: Tachycardiac without murmur present  Abdomen: no tenderness, no masses palpated.  Bowel sounds positive.  Musculoskeletal: no clubbing / cyanosis.  Tenderness to palpation of the lumbar spine worse with any kind of movement. Skin: no rashes, lesions, ulcers. No induration Neurologic: CN 2-12 grossly intact.  Able to move all extremities tremulous Psychiatric: . Alert and oriented x 3.  Anxious mood.   Data Reviewed:  Reviewed patient's labs, imaging, and pertinent records as noted above  Assessment and Plan: L1 compression fracture secondary to fall   Patient presents after having a fall at home with complaints of lower back pain.  Found to have L1 superior endplate compression fracture with at least 40% vertebral height loss.  Imaging noted chronic compression fractures of T3, L2, and L3.  Neurosurgery had been consulted and recommended TLSO brace when out of bed, no lifting more than 10 to 15 pounds and recommended patient follow-up in clinic in the outpatient setting in 2 to 4 weeks -Admit to medical telemetry bed -TLSO brace when out of bed -Oxycodone as needed for pain -PT/OT consulted to to evaluate and treat -Appreciate neurosurgery consultative services  Acute respiratory failure with hypoxia COPD, without acute exacerbation O2 saturations were noted to be as low as 72% on room air while in the ED and requiring 3 L of  oxygen to maintain O2 saturations greater than 92%.  Patient on oxygen at baseline.  Chest x-ray showed no acute abnormality.  Suspect secondary to pain in the setting of patient's compression fracture. -Continuous pulse oximetry with nasal cannula oxygen maintain O2 saturation greater than  92%. -Check CT angiogram of the chest as patient does have a previous history of pulmonary embolism -DuoNebs as needed  Leukocytosis Acute.  WBC elevated at 12.7.  Suspect secondary to above. -Recheck CBC tomorrow morning  Hypocalcemia Acute.  Calcium level noted to be 8 with albumin within normal limits. -Give 2 g of calcium gluconate IV -Continue to monitor and replace as needed  History of DVT/PE on chronic anticoagulation Patient with remote history of DVT, and was noted to have incidental right pulmonary in 2017.  Currently on anticoagulation of Xarelto. -Continue Xareltounless found to have concern for failure  Essential hypertension -Resume home blood pressure regimen  Hyperlipidemia -Continue Crestor  CAD Patient denies any complaints of chest pain.  Prior history of stent placement. -Continue statin  Alcohol Use Patient reported drinking half a pint of brandy prior to the fall yesterday.  He is tremulous on physical exam, but denies drinking on a daily basis. -CIWA protocols initiated with scheduled Ativan  Tobacco abuse Patient reports smoking anywhere from 5 to 30 cigarettes/day on average. -Nicotine patch offered  DVT prophylaxis: Xarelto Advance Care Planning:   Code Status: Full Code   Consults: Neurosurgery  Family Communication: None  Severity of Illness: The appropriate patient status for this patient is OBSERVATION. Observation status is judged to be reasonable and necessary in order to provide the required intensity of service to ensure the patient's safety. The patient's presenting symptoms, physical exam findings, and initial radiographic and laboratory data in the context of their medical condition is felt to place them at decreased risk for further clinical deterioration. Furthermore, it is anticipated that the patient will be medically stable for discharge from the hospital within 2 midnights of admission.   Author: Norval Morton, MD 06/03/2022  12:59 PM  For on call review [email or web address].

## 2022-06-03 NOTE — ED Provider Notes (Signed)
[facility] Provider Note   CSN: [id number] Arrival date & time: 06/03/22  [id number]     History  Chief Complaint  Patient presents with   Lytle Michaels    Timothy Wood is a 82 y.o. male.  HPI Patient presents with back pain.  Patient acknowledges ongoing cigarette use, history of lung cancer status post lobectomy, but was in his usual state of until yesterday.  About 10 hours ago the patient fell while walking backwards after feeding his colony of feral cats.  He fell, striking his lower thoracic upper lumbar region, and has been on the ground since that time.  He was eventually able to get to a phone call for assistance.  No head trauma, no loss of consciousness, no new weakness in his extremity, but the pain is severe in his back. EMS reports the patient was awake, alert, in transport.    Home Medications Prior to Admission medications   Medication Sig Start Date End Date Taking? Authorizing Provider  Acetaminophen 500 MG capsule Take 1,000 mg by mouth daily as needed for fever.    [provider]  amLODipine (NORVASC) 5 MG tablet Take 1 tablet (5 mg total) by mouth daily. 04/20/22   Donato Heinz, MD  carvedilol (COREG) 6.25 MG tablet Take 1 tablet (6.25 mg total) by mouth 2 (two) times daily. 04/29/22   Donato Heinz, MD  DULoxetine (CYMBALTA) 30 MG capsule TAKE ONE CAPSULE BY MOUTH DAILY 06/23/21   [name], [name], MD  EPINEPHrine (EPIPEN 2-PAK) 0.3 mg/0.3 mL IJ SOAJ injection To use daily if needed for severe allergy reaction. 08/20/21   [name], [name], MD  furosemide (LASIX) 20 MG tablet Take 1 tablet (20 mg total) by mouth daily as needed (for weight increase of 3 lbs overnight or 5 lbs in 1 week). 04/22/22   Donato Heinz, MD  irbesartan (AVAPRO) 150 MG tablet TAKE ONE TABLET BY MOUTH DAILY 12/15/21   [name], [name], MD  magnesium oxide (MAG-OX) 400 MG tablet Take 1 tablet (400 mg total) by mouth daily. For 3  days 04/22/22   Donato Heinz, MD  omeprazole (PRILOSEC) 20 MG capsule TAKE ONE CAPSULE BY MOUTH TWICE A DAY BEFORE MEALS Patient taking differently: Take 20 mg by mouth daily. 12/04/21   [name], [name], MD  potassium chloride (KLOR-CON) 10 MEQ tablet Take 1 tablet (10 mEq total) by mouth daily as needed (with lasix). 04/22/22 08/20/22  Donato Heinz, MD  rivaroxaban (XARELTO) 10 MG TABS tablet Take 1 tablet (10 mg total) by mouth daily. 10/13/21   [name], [name], MD  rosuvastatin (CRESTOR) 20 MG tablet TAKE ONE TABLET BY MOUTH DAILY 05/27/21   [name], [name], MD  tiZANidine (ZANAFLEX) 2 MG tablet TAKE ONE TABLET BY MOUTH TWICE A DAY AS NEEDED 02/18/22   [name], [name], MD      Allergies    Codeine and Hydrocodone    Review of Systems   Review of Systems  All other systems reviewed and are negative.   Physical Exam Updated Vital Signs BP (!) 173/103   Pulse (!) 110   Temp 98 F (36.7 C) (Oral)   Resp 19   Ht 5\' 8"  (1.727 m)   Wt 76 kg   SpO2 92%   BMI 25.48 kg/m  Physical Exam Vitals and nursing note reviewed.  Constitutional:      General: He is not in acute distress.  Appearance: He is well-developed.  HENT:     Head: Normocephalic and atraumatic.  Eyes:     Conjunctiva/sclera: Conjunctivae normal.  Cardiovascular:     Rate and Rhythm: Normal rate and regular rhythm.  Pulmonary:     Effort: Pulmonary effort is normal. No respiratory distress.     Breath sounds: No stridor.     Comments: Smoker's cough Abdominal:     General: There is no distension.  Musculoskeletal:       Arms:  Skin:    General: Skin is warm and dry.  Neurological:     General: No focal deficit present.     Mental Status: He is alert and oriented to person, place, and time.     ED Results / Procedures / Treatments   Labs (all labs ordered are listed, but only abnormal results are displayed) Labs Reviewed  COMPREHENSIVE METABOLIC PANEL - Abnormal; Notable for the  following components:      Result Value   Glucose, Bld 102 (*)    Calcium 8.0 (*)    All other components within normal limits  ETHANOL - Abnormal; Notable for the following components:   Alcohol, Ethyl (B) 135 (*)    All other components within normal limits  CBC WITH DIFFERENTIAL/PLATELET - Abnormal; Notable for the following components:   WBC 12.7 (*)    Neutro Abs 10.9 (*)    All other components within normal limits  CK    EKG None  Radiology DG Chest 1 View  Result Date: 06/03/2022 CLINICAL DATA:  Pain after fall EXAM: CHEST  1 VIEW COMPARISON:  08/12/2018 x-ray.  CT 03/19/2022 FINDINGS: There is some scarring atelectatic changes in the right mid to lower lung. Surgical changes noted on the prior CT scan. No consolidation, pneumothorax or effusion. No edema. Normal cardiopericardial silhouette. Overlapping cardiac leads IMPRESSION: No acute cardiopulmonary disease. Electronically Signed   By: Jill Side M.D.   On: 06/03/2022 12:06   DG Lumbar Spine Complete  Result Date: 06/03/2022 CLINICAL DATA:  fall upper lumbar / lower thoracic pain; 190176 Fall 190176 EXAM: LUMBAR SPINE - COMPLETE 4+ VIEW; THORACIC SPINE 2 VIEWS COMPARISON:  CT chest 03/19/2022, x-ray lumbar spine 10/23/2019 FINDINGS: Limited evaluation due to overlapping osseous structures and overlying soft tissues. There is no evidence of thoracic spine fracture. Chronic T3 superior endplate vertebral body height loss not well visualized due to overlying osseous structures. Interval development of an age-indeterminate L1 superior endplate compression fracture with at least 40% vertebral body height loss. Chronic stable L2 through L3 superior endplate compression fractures. Multilevel mild degenerative changes of the thoracic spine. Multilevel moderate degenerative changes of the lumbar spine with associated moderate severe intervertebral disc space narrowing at the L4-L5 L5-S1 levels. Alignment is normal. Atherosclerotic  plaque. Aortoiliac stent. IMPRESSION: 1. Interval development of an age-indeterminate L1 superior endplate compression fracture with at least 40% vertebral body height loss. 2. No acute displaced fracture or traumatic listhesis of the thoracic spine. 3. Chronic T3 superior endplate vertebral body height loss not well visualized due to overlying osseous structures. 4. Chronic stable L2 through L3 superior endplate compression fractures. 5. Limited evaluation due to overlapping osseous structures and overlying soft tissues. 6.  Aortic Atherosclerosis (ICD10-I70.0). Aortoiliac stent. Electronically Signed   By: Iven Finn M.D.   On: 06/03/2022 09:39   DG Thoracic Spine 2 View  Result Date: 06/03/2022 CLINICAL DATA:  fall upper lumbar / lower thoracic pain; 190176 Fall 190176 EXAM: LUMBAR SPINE - COMPLETE 4+  VIEW; THORACIC SPINE 2 VIEWS COMPARISON:  CT chest 03/19/2022, x-ray lumbar spine 10/23/2019 FINDINGS: Limited evaluation due to overlapping osseous structures and overlying soft tissues. There is no evidence of thoracic spine fracture. Chronic T3 superior endplate vertebral body height loss not well visualized due to overlying osseous structures. Interval development of an age-indeterminate L1 superior endplate compression fracture with at least 40% vertebral body height loss. Chronic stable L2 through L3 superior endplate compression fractures. Multilevel mild degenerative changes of the thoracic spine. Multilevel moderate degenerative changes of the lumbar spine with associated moderate severe intervertebral disc space narrowing at the L4-L5 L5-S1 levels. Alignment is normal. Atherosclerotic plaque. Aortoiliac stent. IMPRESSION: 1. Interval development of an age-indeterminate L1 superior endplate compression fracture with at least 40% vertebral body height loss. 2. No acute displaced fracture or traumatic listhesis of the thoracic spine. 3. Chronic T3 superior endplate vertebral body height loss not well  visualized due to overlying osseous structures. 4. Chronic stable L2 through L3 superior endplate compression fractures. 5. Limited evaluation due to overlapping osseous structures and overlying soft tissues. 6.  Aortic Atherosclerosis (ICD10-I70.0). Aortoiliac stent. Electronically Signed   By: Iven Finn M.D.   On: 06/03/2022 09:39    Procedures Procedures    Medications Ordered in ED Medications  diclofenac (FLECTOR) 1.3 % 1 patch (1 patch Transdermal Patch Applied 06/03/22 1000)  fentaNYL (SUBLIMAZE) injection 50 mcg (50 mcg Intravenous Given 06/03/22 0828)  sodium chloride 0.9 % bolus 1,000 mL (0 mLs Intravenous Stopped 06/03/22 1030)    ED Course/ Medical Decision Making/ A&P                             Medical Decision Making Patient with history of lung cancer, ongoing cigarette use, COPD but otherwise in his usual health until the fall presents with back pain, prolonged downtime.  Differential includes fracture versus retroperitoneal bleed, dehydration, electrolyte abnormalities. Patient put on monitors, received fentanyl, diclofenac, x-rays labs ordered.  Cardiac 95 sinus normal Pulse ox 76% room air abnormal.  This corrects to 95% with 3 L nasal cannula.   Amount and/or Complexity of Data Reviewed Independent Historian: EMS Labs: ordered. Decision-making details documented in ED Course. Radiology: ordered and independent interpretation performed. Decision-making details documented in ED Course.  Risk Prescription drug management. Decision regarding hospitalization.   12:48 PM Patient remains in similar condition.  Findings have discussed with him at length, and was awaiting a call back from a neurosurgical colleagues for assistance with management of new lumbar compression fracture.  Patient notes a history of these in the distant past, but states that he had been doing generally well prior to the event, though he does acknowledge some ongoing shortness of breath,  attributed to his smoking.  The patient was found to have persistent hypoxia, in spite of multiple attempts to titrate away from supplemental oxygen after initially arrived with hypoxia, 76% on room air. Given his new oxygen requirement, suspicion for COPD, patient will require require admission to facilitate evaluation, possible discharge with oxygen. No evidence for pneumonia on x-ray. Neurosurgery has been consulted for assistance with his lumbar compression fracture.         Final Clinical Impression(s) / ED Diagnoses Final diagnoses:  Fall, initial encounter  Nontraumatic compression fracture of L1 vertebra, initial encounter ([place])  Hypoxia     Carmin Muskrat, MD 06/03/22 1250

## 2022-06-03 NOTE — ED Notes (Signed)
Patient's oxygen 79 on RA. Placed on 3L [place] at this time. O2 sat improved to 94%.

## 2022-06-04 ENCOUNTER — Observation Stay (HOSPITAL_COMMUNITY): Payer: Medicare PPO

## 2022-06-04 DIAGNOSIS — M4856XA Collapsed vertebra, not elsewhere classified, lumbar region, initial encounter for fracture: Secondary | ICD-10-CM | POA: Diagnosis present

## 2022-06-04 DIAGNOSIS — Y906 Blood alcohol level of 120-199 mg/100 ml: Secondary | ICD-10-CM | POA: Diagnosis present

## 2022-06-04 DIAGNOSIS — I2584 Coronary atherosclerosis due to calcified coronary lesion: Secondary | ICD-10-CM | POA: Diagnosis present

## 2022-06-04 DIAGNOSIS — M4854XA Collapsed vertebra, not elsewhere classified, thoracic region, initial encounter for fracture: Secondary | ICD-10-CM | POA: Diagnosis present

## 2022-06-04 DIAGNOSIS — Z789 Other specified health status: Secondary | ICD-10-CM | POA: Diagnosis not present

## 2022-06-04 DIAGNOSIS — I2699 Other pulmonary embolism without acute cor pulmonale: Secondary | ICD-10-CM | POA: Diagnosis present

## 2022-06-04 DIAGNOSIS — W1830XA Fall on same level, unspecified, initial encounter: Secondary | ICD-10-CM | POA: Diagnosis present

## 2022-06-04 DIAGNOSIS — Z800 Family history of malignant neoplasm of digestive organs: Secondary | ICD-10-CM | POA: Diagnosis not present

## 2022-06-04 DIAGNOSIS — I2510 Atherosclerotic heart disease of native coronary artery without angina pectoris: Secondary | ICD-10-CM | POA: Diagnosis present

## 2022-06-04 DIAGNOSIS — E860 Dehydration: Secondary | ICD-10-CM | POA: Diagnosis present

## 2022-06-04 DIAGNOSIS — F17210 Nicotine dependence, cigarettes, uncomplicated: Secondary | ICD-10-CM | POA: Diagnosis present

## 2022-06-04 DIAGNOSIS — Z801 Family history of malignant neoplasm of trachea, bronchus and lung: Secondary | ICD-10-CM | POA: Diagnosis not present

## 2022-06-04 DIAGNOSIS — Y92009 Unspecified place in unspecified non-institutional (private) residence as the place of occurrence of the external cause: Secondary | ICD-10-CM | POA: Diagnosis not present

## 2022-06-04 DIAGNOSIS — I739 Peripheral vascular disease, unspecified: Secondary | ICD-10-CM | POA: Diagnosis present

## 2022-06-04 DIAGNOSIS — F1010 Alcohol abuse, uncomplicated: Secondary | ICD-10-CM | POA: Diagnosis present

## 2022-06-04 DIAGNOSIS — J690 Pneumonitis due to inhalation of food and vomit: Secondary | ICD-10-CM | POA: Diagnosis not present

## 2022-06-04 DIAGNOSIS — E876 Hypokalemia: Secondary | ICD-10-CM | POA: Diagnosis not present

## 2022-06-04 DIAGNOSIS — I2609 Other pulmonary embolism with acute cor pulmonale: Secondary | ICD-10-CM | POA: Diagnosis not present

## 2022-06-04 DIAGNOSIS — J449 Chronic obstructive pulmonary disease, unspecified: Secondary | ICD-10-CM | POA: Diagnosis present

## 2022-06-04 DIAGNOSIS — Z85118 Personal history of other malignant neoplasm of bronchus and lung: Secondary | ICD-10-CM | POA: Diagnosis not present

## 2022-06-04 DIAGNOSIS — I10 Essential (primary) hypertension: Secondary | ICD-10-CM | POA: Diagnosis present

## 2022-06-04 DIAGNOSIS — F32A Depression, unspecified: Secondary | ICD-10-CM | POA: Diagnosis present

## 2022-06-04 DIAGNOSIS — E782 Mixed hyperlipidemia: Secondary | ICD-10-CM | POA: Diagnosis present

## 2022-06-04 DIAGNOSIS — W19XXXA Unspecified fall, initial encounter: Secondary | ICD-10-CM | POA: Diagnosis not present

## 2022-06-04 DIAGNOSIS — Z7901 Long term (current) use of anticoagulants: Secondary | ICD-10-CM | POA: Diagnosis not present

## 2022-06-04 DIAGNOSIS — I82402 Acute embolism and thrombosis of unspecified deep veins of left lower extremity: Secondary | ICD-10-CM | POA: Diagnosis present

## 2022-06-04 DIAGNOSIS — J9601 Acute respiratory failure with hypoxia: Secondary | ICD-10-CM | POA: Diagnosis present

## 2022-06-04 DIAGNOSIS — Z91199 Patient's noncompliance with other medical treatment and regimen due to unspecified reason: Secondary | ICD-10-CM | POA: Diagnosis not present

## 2022-06-04 DIAGNOSIS — Z885 Allergy status to narcotic agent status: Secondary | ICD-10-CM | POA: Diagnosis not present

## 2022-06-04 LAB — CBC
HCT: 45 % (ref 39.0–52.0)
Hemoglobin: 15 g/dL (ref 13.0–17.0)
MCH: 31.1 pg (ref 26.0–34.0)
MCHC: 33.3 g/dL (ref 30.0–36.0)
MCV: 93.4 fL (ref 80.0–100.0)
Platelets: 170 10*3/uL (ref 150–400)
RBC: 4.82 MIL/uL (ref 4.22–5.81)
RDW: 13.1 % (ref 11.5–15.5)
WBC: 14.1 10*3/uL — ABNORMAL HIGH (ref 4.0–10.5)
nRBC: 0 % (ref 0.0–0.2)

## 2022-06-04 LAB — BASIC METABOLIC PANEL
Anion gap: 9 (ref 5–15)
BUN: 8 mg/dL (ref 8–23)
CO2: 24 mmol/L (ref 22–32)
Calcium: 8.1 mg/dL — ABNORMAL LOW (ref 8.9–10.3)
Chloride: 102 mmol/L (ref 98–111)
Creatinine, Ser: 0.83 mg/dL (ref 0.61–1.24)
GFR, Estimated: >60 mL/min (ref >60)
Glucose, Bld: 96 mg/dL (ref 70–99)
Potassium: 3.6 mmol/L (ref 3.5–5.1)
Sodium: 135 mmol/L (ref 135–145)

## 2022-06-04 LAB — ECHOCARDIOGRAM COMPLETE
AR max vel: 2.89 cm2
AV Area VTI: 3.17 cm2
AV Area mean vel: 2.82 cm2
AV Mean grad: 3 mmHg
AV Peak grad: 6.5 mmHg
Ao pk vel: 1.27 m/s
Area-P 1/2: 3.6 cm2
Calc EF: 61.1 %
Height: 68 in
S' Lateral: 2.7 cm
Single Plane A2C EF: 58.2 %
Single Plane A4C EF: 65.5 %
Weight: 2680.79 oz

## 2022-06-04 LAB — HEPARIN LEVEL (UNFRACTIONATED)
Heparin Unfractionated: 0.26 IU/mL — ABNORMAL LOW (ref 0.30–0.70)
Heparin Unfractionated: 0.32 IU/mL (ref 0.30–0.70)

## 2022-06-04 LAB — APTT: aPTT: 72 seconds — ABNORMAL HIGH (ref 24–36)

## 2022-06-04 LAB — LACTIC ACID, PLASMA
Lactic Acid, Venous: 1.2 mmol/L (ref 0.5–1.9)
Lactic Acid, Venous: 1.1 mmol/L (ref 0.5–1.9)

## 2022-06-04 LAB — PROCALCITONIN: Procalcitonin: <0.1 ng/mL

## 2022-06-04 LAB — MAGNESIUM: Magnesium: 1.3 mg/dL — ABNORMAL LOW (ref 1.7–2.4)

## 2022-06-04 MED ORDER — SODIUM CHLORIDE 0.9 % IV SOLN
1.0000 g | INTRAVENOUS | Status: DC
  Administered 2022-06-04 – 2022-06-05 (×2): 1 g via INTRAVENOUS
  Filled 2022-06-04 (×2): qty 10

## 2022-06-04 MED ORDER — MAGNESIUM SULFATE 2 GM/50ML IV SOLN
2.0000 g | Freq: Once | INTRAVENOUS | Status: AC
  Administered 2022-06-04: 2 g via INTRAVENOUS
  Filled 2022-06-04: qty 50

## 2022-06-04 MED ORDER — HEPARIN BOLUS VIA INFUSION
3000.0000 [IU] | Freq: Once | INTRAVENOUS | Status: AC
  Administered 2022-06-04: 3000 [IU] via INTRAVENOUS
  Filled 2022-06-04: qty 3000

## 2022-06-04 MED ORDER — FENTANYL CITRATE PF 50 MCG/ML IJ SOSY
25.0000 ug | PREFILLED_SYRINGE | Freq: Four times a day (QID) | INTRAMUSCULAR | Status: DC | PRN
  Administered 2022-06-04 – 2022-06-05 (×3): 50 ug via INTRAVENOUS
  Filled 2022-06-04 (×3): qty 1

## 2022-06-04 MED ORDER — HEPARIN (PORCINE) 25000 UT/250ML-% IV SOLN
1400.0000 [IU]/h | INTRAVENOUS | Status: DC
  Administered 2022-06-04: 1200 [IU]/h via INTRAVENOUS
  Administered 2022-06-04: 1350 [IU]/h via INTRAVENOUS
  Filled 2022-06-04 (×2): qty 250

## 2022-06-04 MED ORDER — HYDRALAZINE HCL 20 MG/ML IJ SOLN: 10.0000 mg | INTRAMUSCULAR | Status: DC | PRN

## 2022-06-04 MED ORDER — SODIUM CHLORIDE 0.9 % IV SOLN
500.0000 mg | INTRAVENOUS | Status: DC
  Administered 2022-06-04 – 2022-06-05 (×2): 500 mg via INTRAVENOUS
  Filled 2022-06-04 (×2): qty 5

## 2022-06-04 MED ORDER — NICOTINE 21 MG/24HR TD PT24
21.0000 mg | MEDICATED_PATCH | Freq: Every day | TRANSDERMAL | Status: DC
  Administered 2022-06-04 – 2022-06-09 (×6): 21 mg via TRANSDERMAL
  Filled 2022-06-04 (×6): qty 1

## 2022-06-04 NOTE — Progress Notes (Addendum)
[facility] night cross cover note:   I was notified by on-call radiologist for [facility] that this patient's CTA chest shows evidence of bilateral pulmonary emboli with small clot burden and borderline right heart strain.  Additionally, CTA chest shows evidence of right lower lobe patchy consolidation, concerning for potential pneumonia, including potential aspiration element given finding of fluid in the right mainstem bronchus.  I subsequently discontinued existing order for Xarelto, and consulted inpatient pharmacist for assistance with initiation of heparin drip for treatment of acute bilateral pulmonary emboli.  Also ordered echocardiogram in the morning.   Per my chart review, patient with mild leukocytosis, noting initial white blood cell count of 12,600, with current vital signs notable for mild tachycardia, normotensive to hypertensive blood pressures, with oxygen saturation in the mid 90s on 4 L nasal cannula.  I subsequently ordered procalcitonin level, blood cultures x 2, lactic acid level, followed by initiation of azithromycin and Rocephin for potential right lower lobe pneumonia, with consideration for broadening of coverage to include anaerobic coverage if history is suggestive of a potential element of aspiration.   Additionally, most recent BP noted to be in the 170's / 90's mmHg. I ordered prn iv hydralazine for systolic blood pressure greater than [id number] or diastolic blood pressure greater than 120 mmHg.   Babs Bertin, DO Hospitalist

## 2022-06-04 NOTE — Progress Notes (Signed)
ANTICOAGULATION CONSULT NOTE - Initial Consult  Pharmacy Consult for heparin Indication: pulmonary embolus  Allergies  Allergen Reactions   Codeine Anaphylaxis   Hydrocodone Itching    Patient Measurements: Height: 5\' 8"  (172.7 cm) Weight: 76 kg (167 lb 8.8 oz) IBW/kg (Calculated) : 68.4  Vital Signs: Temp: 99.2 F (37.3 C) (03/21 0000) Temp Source: Oral (03/21 0000) BP: 173/92 (03/21 0000) Pulse Rate: 109 (03/21 0000)  Labs: Recent Labs    06/03/22 0825  HGB 16.2  HCT 49.8  PLT 221  CREATININE 0.94  CKTOTAL 110    Estimated Creatinine Clearance: 59.6 mL/min (by C-G formula based on SCr of 0.94 mg/dL).   Medical History: Past Medical History:  Diagnosis Date   Adenocarcinoma of lung, stage 1 ([place])    Anemia    Angiodysplasia of cecum 11/23/2017   Multiple 1-2 mm   Arthritis    back possibly   Blood clotting disorder (HCC)    Cataract    removed bilaterally    Chronic obstructive pulmonary disease (COPD) ([place])    "patient not aware"   Clotting disorder ([place])    PE LUNG, dvt leg    Colon polyps    Depression    Diverticulosis    DVT (deep venous thrombosis) ([place])    Post op  knee surgery   DVT (deep venous thrombosis) (HCC)    GERD (gastroesophageal reflux disease)    Histoplasmosis    Histoplasmosis with pneumonia ([place]) 04/01/2015   Hypercholesteremia    Hyperlipidemia    Hypertension    Laceration of head 08/05/2015   Lumbar compression fracture ([place])    Lung cancer ([place]) dx'd 2016   Personal history of colonic polyps 2008   multiple adenomas 2008-2009 exams with large cecal adenoma   Pneumonia    in history   Polyposis coli - attenuated 09/12/2010   2008: adenomas with 9 polyps index and 2.5 cm cecal polyp removed over 3 exams, last 2009 with APC. diverticulosis also. 11/24/2010: 12 polyps removed - adenomas Anticipate routine repeat colonoscopy Spring 2013 Have recommended he see genetics counselor re ? Polyposis 03/27/2016 14 polyps removed max 15  mm    Pulmonary nodule 06/2006   9 mm right upper obe pulmonary nodule (negative Pet Scan)   PVD (peripheral vascular disease) (HCC)    left leg s/p stent   Sebaceous cyst    neck   Seborrheic keratosis    Seizures ([place])    4-5 yrs ago had seizures when in ICU- none since this time    Sinusitis    Tobacco abuse    Tremor     Medications:  Medications Prior to Admission  Medication Sig Dispense Refill Last Dose   Acetaminophen 500 MG capsule Take 1,000 mg by mouth daily as needed for fever.      amLODipine (NORVASC) 5 MG tablet Take 1 tablet (5 mg total) by mouth daily. 90 tablet 1    carvedilol (COREG) 6.25 MG tablet Take 1 tablet (6.25 mg total) by mouth 2 (two) times daily. 180 tablet 1    DULoxetine (CYMBALTA) 30 MG capsule TAKE ONE CAPSULE BY MOUTH DAILY 90 capsule 2    EPINEPHrine (EPIPEN 2-PAK) 0.3 mg/0.3 mL IJ SOAJ injection To use daily if needed for severe allergy reaction. 1 each 0    furosemide (LASIX) 20 MG tablet Take 1 tablet (20 mg total) by mouth daily as needed (for weight increase of 3 lbs overnight or 5 lbs in 1 week). 30 tablet 3  irbesartan (AVAPRO) 150 MG tablet TAKE ONE TABLET BY MOUTH DAILY 90 tablet 1    magnesium oxide (MAG-OX) 400 MG tablet Take 1 tablet (400 mg total) by mouth daily. For 3 days 3 tablet 0    omeprazole (PRILOSEC) 20 MG capsule TAKE ONE CAPSULE BY MOUTH TWICE A DAY BEFORE MEALS (Patient taking differently: Take 20 mg by mouth daily.) 180 capsule 1    potassium chloride (KLOR-CON) 10 MEQ tablet Take 1 tablet (10 mEq total) by mouth daily as needed (with lasix). 30 tablet 3    rivaroxaban (XARELTO) 10 MG TABS tablet Take 1 tablet (10 mg total) by mouth daily. 90 tablet 2    rosuvastatin (CRESTOR) 20 MG tablet TAKE ONE TABLET BY MOUTH DAILY 90 tablet 2    tiZANidine (ZANAFLEX) 2 MG tablet TAKE ONE TABLET BY MOUTH TWICE A DAY AS NEEDED 60 tablet 3    Scheduled:   amLODipine  5 mg Oral Daily   carvedilol  6.25 mg Oral BID   DULoxetine  30 mg  Oral Daily   folic acid  1 mg Oral Daily   irbesartan  150 mg Oral Daily   LORazepam  0-4 mg Oral Q6H   Followed by   Derrill Memo ON 06/05/2022] LORazepam  0-4 mg Oral Q12H   multivitamin with minerals  1 tablet Oral Daily   nicotine  14 mg Transdermal Daily   pantoprazole  40 mg Oral Daily   rosuvastatin  20 mg Oral Daily   sodium chloride flush  3 mL Intravenous Q12H   thiamine  100 mg Oral Daily   Or   thiamine  100 mg Intravenous Daily   Infusions:   azithromycin     cefTRIAXone (ROCEPHIN)  IV     magnesium sulfate bolus IVPB 1 g (06/04/22 0042)    Assessment: 82yo male presented to ED 3/20 am after falling outside and experiencing back pain, found to have sustained L1 compression fx as well as hypoxia, CT now reveals bilateral PE with overall small clot burden >> to begin heparin.  Of note pt takes low-dose Xarelto for h/o VTE, states he usually takes it at [place] and last dose was likely 3/19.  Goal of Therapy:  Heparin level 0.3-0.7 units/ml aPTT 66-102 seconds Monitor platelets by anticoagulation protocol: Yes   Plan:  Heparin 3000 units IV bolus x1 followed by infusion at 1200 units/hr. Monitor heparin levels, aPTT (while Xarelto affects anti-Xa assay), and CBC.  Wynona Neat, PharmD, BCPS  06/04/2022,12:55 AM

## 2022-06-04 NOTE — TOC Initial Note (Signed)
Transition of Care [facility]) - Initial/Assessment Note    Patient Details  Name: Timothy Wood MRN: [id number] Date of Birth: January 09, 1941  Transition of Care [facility]) CM/SW Contact:    Benard Halsted, LCSW Phone Number: 06/04/2022, 2:43 PM  Clinical Narrative:                 CSW received consult for possible SNF at time of discharge. CSW spoke with patient. He stated that he does not want any type of rehab and does not want to go to SNF. CSW discussed his current difficulty with mobility and he stated that he has always been unable to walk and does not plan to walk again. CSW encouraged further conversation and patient shared that he does not have much to engage for in life as his wife and son passed away 8 years ago. He stated his daughter comes over for lunch every Thursday but that he is able to manage until it is time for him to pass away, which he was very forthcoming and blunt about. He stated he has tried counseling before and did not like it. He was joking with CSW by the end of the conversation. CSW discussed equipment needs with patient and he stated he has a walker and crutches at home. Patient states his daughter will come pick him up at discharge. CSW asked if his daughter could be contacted and updated on his decision and he initially said no but then agreed for CSW to call her, stating that she would say he was being "stubborn".   CSW spoke with patient's daughter and explained patient's refusal of follow up services. She stated she knows that he will refuse SNF but she is going to talk to him tomorrow about home health services and call CSW back. She reported she lives in [place] and can increase her visits somewhat but cannot move in with him. She reported understanding of his current state and was able to visit him a few minutes ago. Will follow up.    Expected Discharge Plan: Home/Self Care Barriers to Discharge: Continued Medical Work up   Patient Goals and CMS Choice Patient  states their goals for this hospitalization and ongoing recovery are:: Return home CMS Medicare.gov Compare Post Acute Care list provided to:: Patient Choice offered to / list presented to : Patient, Adult [place] ownership interest in [facility]provided to:: Patient    Expected Discharge Plan and Services In-house Referral: Clinical Social Work     Living arrangements for the past 2 months: Single Family Home                                      Prior Living Arrangements/Services Living arrangements for the past 2 months: Single Family Home Lives with:: Self Patient language and need for interpreter reviewed:: Yes Do you feel safe going back to the place where you live?: Yes      Need for Family Participation in Patient Care: Yes (Comment) Care giver support system in place?: Yes (comment) Current home services: DME [facility]) Criminal Activity/Legal Involvement Pertinent to Current Situation/Hospitalization: No - Comment as needed  Activities of Daily Living      Permission Sought/Granted Permission sought to share information with : Facility Sport and exercise psychologist, Family Supports Permission granted to share information with : Yes, Verbal Permission Granted  Share Information with NAME: Sharyn Lull     Permission granted  to share info w Relationship: Daughter  Permission granted to share info w Contact Information: [phone]  Emotional Assessment Appearance:: Appears stated age Attitude/Demeanor/Rapport: Engaged Affect (typically observed): Accepting, Appropriate, Grieving, Pleasant Orientation: : Oriented to Self, Oriented to Place, Oriented to  Time, Oriented to Situation Alcohol / Substance Use: Not Applicable Psych Involvement: No (comment)  Admission diagnosis:  Hypoxia [R09.02] Acute respiratory failure with hypoxia ([place]) [J96.01] Fall, initial encounter [W19.XXXA] Nontraumatic compression fracture of L1 vertebra, initial encounter  ([place]) [id number] Pulmonary embolism ([place]) [I26.99] Patient Active Problem List   Diagnosis Date Noted   Pulmonary embolism ([place]) 06/04/2022   Acute respiratory failure with hypoxia ([place]) 06/03/2022   Fall at home, initial encounter 06/03/2022   Leukocytosis 06/03/2022   Hypocalcemia 06/03/2022   History of DVT (deep vein thrombosis) 06/03/2022   Aortic atherosclerosis ([place]) 02/14/2021   Vitamin D deficiency, unspecified 04/18/2019   Personal history of colonic polyps 02/01/2019   Screening for viral disease 02/01/2019   Nightmare disorder 08/12/2018   Chronic anticoagulation 04/04/2018   Angiodysplasia of cecum 11/23/2017   Actinic keratoses 10/01/2017   Unstable gait 06/08/2016   Genetic testing 04/29/2016   Ganglion of right wrist 04/28/2016   Depression, major, in partial remission ([place]) 04/16/2016   Encounter for therapeutic drug monitoring 08/16/2015   Alcohol use    Hand laceration    Closed compression fracture of L1 vertebra (HCC)    Hx of pulmonary emboli ([place]) 08/05/2015   Lung cancer ([place]) 08/05/2015   Syncope 08/05/2015   CAD (coronary artery disease) 08/05/2015   Kidney disease 08/05/2015   Bone lesion 08/05/2015   Prostate hypertrophy 08/05/2015   Hx of pulmonary embolus [id number]   Complicated grieving 123456   Adenocarcinoma of right lung ([place]) 01/24/2015   Coronary atherosclerosis due to calcified coronary lesion of native artery 01/14/2015   Tremor 09/19/2014   COPD GOLD I 03/03/2013   Grieving 02/06/2013   Abnormal prostate on physical examination 08/01/2012   Polyposis coli - attenuated 09/12/2010   Peripheral vascular disease ([place]) 06/11/2008   Hyperlipemia, mixed 03/02/2008   SEBORRHEIC KERATOSIS 03/02/2008   BACK PAIN, LUMBAR, CHRONIC 09/30/2007   TOBACCO ABUSE 08/19/2007   Essential hypertension 08/19/2007   Multiple pulmonary nodules 08/19/2007   PCP:  [name], [name], MD Pharmacy:   [id number] - [address] [place] -  [address] [place] [address] Phone: [phone] Fax: [phone]  [facility] [place], [place] - [place] [address] Phone: [phone] Fax: [phone]     Social Determinants of Health (SDOH) Social History: SDOH Screenings   Food Insecurity: No Food Insecurity (05/10/2022)  Housing: Low Risk  (05/10/2022)  Transportation Needs: No Transportation Needs (05/10/2022)  Utilities: Not At Risk (05/10/2022)  Alcohol Screen: Low Risk  (05/08/2020)  Depression (PHQ2-9): Low Risk  (11/03/2021)  Recent Concern: Depression (PHQ2-9) - High Risk (10/13/2021)  Financial Resource Strain: Low Risk  (11/03/2021)  Physical Activity: Inactive (11/03/2021)  Social Connections: Socially Isolated (05/08/2020)  Stress: Stress Concern Present (11/03/2021)  Tobacco Use: High Risk (06/03/2022)   SDOH Interventions:     Readmission Risk Interventions     No data to display

## 2022-06-04 NOTE — Progress Notes (Signed)
ANTICOAGULATION CONSULT NOTE  Pharmacy Consult for heparin Indication: pulmonary embolus  Allergies  Allergen Reactions   Codeine Anaphylaxis   Hydrocodone Itching    Patient Measurements: Height: 5\' 8"  (172.7 cm) Weight: 76 kg (167 lb 8.8 oz) IBW/kg (Calculated) : 68.4  Vital Signs: Temp: 97.8 F (36.6 C) (03/21 1950) Temp Source: Oral (03/21 1950) BP: 141/79 (03/21 1950) Pulse Rate: 82 (03/21 1950)  Labs: Recent Labs    06/03/22 0825 06/04/22 0501 06/04/22 0808 06/04/22 2022  HGB 16.2 15.0  --   --   HCT 49.8 45.0  --   --   PLT 221 170  --   --   APTT  --   --  72*  --   HEPARINUNFRC  --   --  0.26* 0.32  CREATININE 0.94 0.83  --   --   CKTOTAL 110  --   --   --      Estimated Creatinine Clearance: 67.5 mL/min (by C-G formula based on SCr of 0.83 mg/dL).  Assessment: 82yo male presented to ED 3/20 am after falling outside and experiencing back pain, found to have sustained L1 compression fx as well as hypoxia, CT now reveals bilateral PE with overall small clot burden >> to begin heparin.  Of note pt takes low-dose Xarelto for h/o VTE, states he usually takes it at 10a and last dose was likely 3/19 although there is no seen refill history for this medication.  Heparin level is subtherapeutic at 0.26 (6 hr level) on 1200 units/hr. No bleeding noted, CBC is stable. There is no need to monitor aPTT since heparin level is not affected by Xarelto.  Heparin level came back therapeutic tonight but on the low side. We will increase the rate slightly and check in AM.   Goal of Therapy:  Heparin level 0.3-0.7 units/ml Monitor platelets by anticoagulation protocol: Yes   Plan:  Increase heparin infusion to 1400 units/hr Monitor daily heparin level, CBC Monitor for s/sx of bleeding  Onnie Boer, PharmD, BCIDP, AAHIVP, CPP Infectious Disease Pharmacist 06/04/2022 8:52 PM

## 2022-06-04 NOTE — Progress Notes (Signed)
ANTICOAGULATION CONSULT NOTE  Pharmacy Consult for heparin Indication: pulmonary embolus  Allergies  Allergen Reactions   Codeine Anaphylaxis   Hydrocodone Itching    Patient Measurements: Height: 5\' 8"  (172.7 cm) Weight: 76 kg (167 lb 8.8 oz) IBW/kg (Calculated) : 68.4  Vital Signs: Temp: 99 F (37.2 C) (03/21 0400) Temp Source: Oral (03/21 0400) BP: 159/66 (03/21 0400) Pulse Rate: 99 (03/21 0400)  Labs: Recent Labs    06/03/22 0825 06/04/22 0501 06/04/22 0808  HGB 16.2 15.0  --   HCT 49.8 45.0  --   PLT 221 170  --   APTT  --   --  72*  HEPARINUNFRC  --   --  0.26*  CREATININE 0.94 0.83  --   CKTOTAL 110  --   --      Estimated Creatinine Clearance: 67.5 mL/min (by C-G formula based on SCr of 0.83 mg/dL).  Assessment: 82yo male presented to ED 3/20 am after falling outside and experiencing back pain, found to have sustained L1 compression fx as well as hypoxia, CT now reveals bilateral PE with overall small clot burden >> to begin heparin.  Of note pt takes low-dose Xarelto for h/o VTE, states he usually takes it at 10a and last dose was likely 3/19 although there is no seen refill history for this medication.  Heparin level is subtherapeutic at 0.26 (6 hr level) on 1200 units/hr. No bleeding noted, CBC is stable. There is no need to monitor aPTT since heparin level is not affected by Xarelto.  Goal of Therapy:  Heparin level 0.3-0.7 units/ml Monitor platelets by anticoagulation protocol: Yes   Plan:  Increase heparin infusion to 1350 units/hr 8h heparin level Monitor daily heparin level, CBC Monitor for s/sx of bleeding  Thank you for involving pharmacy in this patient's care.  Renold Genta, PharmD, BCPS Clinical Pharmacist Clinical phone for 06/04/2022 is [phone] 06/04/2022 10:06 AM

## 2022-06-04 NOTE — Progress Notes (Signed)
Physical Therapy Treatment Patient Details Name: Timothy Wood MRN: [id number] DOB: 12/13/1940 Today's Date: 06/04/2022   History of Present Illness Pt is a 82 y.o. M who presents 06/02/2021 after a fall with new oxygen requirement and mild L1 compression fracture and chronic, healed fractures. Pt also found to have PE. Significant PMH: lung CA, COPD, back pain.    PT Comments    Pt with improved tolerance of today's session but remains limited by fatigue and pain. Pt able to stand and transfer to chair today with minAx2 and RW, cueing throughout for precautions and proper technique. Pt educated on spinal precautions as well as use of TLSO, pt verbalizing understanding with cueing for decreasing bending when preparing to stand and ambulate. Pt will continue to benefit from skilled acute PT at this time to progress mobility, discharge recommendations remain appropriate as pt doesn't have full time care at home and is continuing to require assistance with all mobility.     Recommendations for follow up therapy are one component of a multi-disciplinary discharge planning process, led by the attending physician.  Recommendations may be updated based on patient status, additional functional criteria and insurance authorization.  Follow Up Recommendations  Skilled nursing-short term rehab (<3 hours/day) (if significantly improved pain control, may progress home) Can patient physically be transported by private vehicle: No   Assistance Recommended at Discharge Frequent or constant Supervision/Assistance  Patient can return home with the following Two people to help with walking and/or transfers;Two people to help with bathing/dressing/bathroom   Equipment Recommendations  BSC/3in1    Recommendations for Other Services       Precautions / Restrictions Precautions Precautions: Back;Fall Precaution Comments: Back precautions for comfort Required Braces or Orthoses: Spinal Brace Spinal Brace:  Thoracolumbosacral orthotic;Applied in sitting position Restrictions Weight Bearing Restrictions: No     Mobility  Bed Mobility Overal bed mobility: Needs Assistance Bed Mobility: Rolling, Sidelying to Sit Rolling: Min assist Sidelying to sit: Mod assist, +2 for safety/equipment       General bed mobility comments: cueing for proper technique for log roll technique, +2 for safety for transition, pt with increased pain with sidelying to sit but easing once in sitting    Transfers Overall transfer level: Needs assistance Equipment used: Rolling walker (2 wheels) Transfers: Sit to/from Stand, Bed to chair/wheelchair/BSC Sit to Stand: Min assist, +2 physical assistance   Step pivot transfers: Min assist, +2 safety/equipment       General transfer comment: minAx2 for sit<>stand from bed x2 trials. MinA for transfer to chair for balance. Pt with reports of pain with sit>stand but improves once in standing    Ambulation/Gait Ambulation/Gait assistance: Min assist, +2 safety/equipment Gait Distance (Feet): 3 Feet Assistive device: Rolling walker (2 wheels) Gait Pattern/deviations: Step-through pattern, Decreased stride length, Shuffle, Drifts right/left Gait velocity: decreased     General Gait Details: shuffled gait with downward gaze, minA for balance and safety   Stairs             Wheelchair Mobility    Modified Rankin (Stroke Patients Only)       Balance Overall balance assessment: Needs assistance Sitting-balance support: Feet supported Sitting balance-Leahy Scale: Fair     Standing balance support: Bilateral upper extremity supported, During functional activity Standing balance-Leahy Scale: Poor Standing balance comment: reliant on UE support for transfer  Cognition Arousal/Alertness: Awake/alert Behavior During Therapy: WFL for tasks assessed/performed Overall Cognitive Status: Within Functional Limits for  tasks assessed                                 General Comments: occasional inappropriate comments but alert and oriented        Exercises      General Comments General comments (skin integrity, edema, etc.): 4L O2 [place] maintaining 88-92% throughout, RN providing pain medicine during session. MD reporting okay to work with pt as PE was discovered      Pertinent Vitals/Pain Pain Assessment Pain Assessment: Faces Faces Pain Scale: Hurts even more Pain Location: back pain with transitions Pain Descriptors / Indicators: Sharp, Shooting, Grimacing Pain Intervention(s): Monitored during session, Repositioned, RN gave pain meds during session    Home Living                          Prior Function            PT Goals (current goals can now be found in the care plan section) Acute Rehab PT Goals Patient Stated Goal: less pain PT Goal Formulation: With patient Time For Goal Achievement: 06/17/22 Potential to Achieve Goals: Good Progress towards PT goals: Progressing toward goals    Frequency    Min 5X/week      PT Plan Current plan remains appropriate    Co-evaluation PT/OT/SLP Co-Evaluation/Treatment: Yes Reason for Co-Treatment: For patient/therapist safety;To address functional/ADL transfers;Other (comment) PT goals addressed during session: Mobility/safety with mobility;Balance;Proper use of DME        AM-PAC PT "6 Clicks" Mobility   Outcome Measure  Help needed turning from your back to your side while in a flat bed without using bedrails?: A Little Help needed moving from lying on your back to sitting on the side of a flat bed without using bedrails?: A Lot Help needed moving to and from a bed to a chair (including a wheelchair)?: A Lot Help needed standing up from a chair using your arms (e.g., wheelchair or bedside chair)?: A Lot Help needed to walk in hospital room?: A Lot Help needed climbing 3-5 steps with a railing? : Total 6  Click Score: 12    End of Session Equipment Utilized During Treatment: Gait belt;Oxygen Activity Tolerance: Patient limited by fatigue Patient left: in chair;with call bell/phone within reach;with chair alarm set Nurse Communication: Mobility status PT Visit Diagnosis: Pain;Difficulty in walking, not elsewhere classified (R26.2) Pain - part of body:  (back)     Time: [id number] PT Time Calculation (min) (ACUTE ONLY): 23 min  Charges:  $Therapeutic Activity: 8-22 mins                     Charlynne Cousins, PT DPT Acute Rehabilitation Services Office [phone]    Luvenia Heller 06/04/2022, 3:29 PM

## 2022-06-04 NOTE — Evaluation (Signed)
Occupational Therapy Evaluation Patient Details Name: Timothy Wood MRN: [id number] DOB: [birth year] Today's Date: 06/04/2022   History of Present Illness Pt is a 82 y.o. M who presents 06/02/2021 after a fall with new oxygen requirement and mild L1 compression fracture and chronic, healed fractures. Significant PMH: lung CA, COPD, back pain.   Clinical Impression   PTA, pt lives alone and reports typically completely independent with ADLs, IADLs, driving and mobility without AD though intermittently limited by chronic back pain. Pt cleared by MD for OOB evaluation. Pt presents now with deficits in low back pain,  standing balance, strength and cardiopulmonary tolerance with new supplemental O2 needs. Pt requires Mod A for bed mobility, Min A x 2 for transfers using RW. Pt requires Min A for general UB ADLs, Total A for TLSO brace mgmt, and Mod A for LB ADLs due to deficits. Pt does not have consistent support at DC so recommend consideration of postacute rehab at this time. However, pt does have the potential to progress home with pain control and continued mobility.   SpO2 88-92% on 4 L O2 HR 100s     Recommendations for follow up therapy are one component of a multi-disciplinary discharge planning process, led by the attending physician.  Recommendations may be updated based on patient status, additional functional criteria and insurance authorization.   Follow Up Recommendations  Skilled nursing-short term rehab (<3 hours/day) (potential to progress to home)     Assistance Recommended at Discharge Frequent or constant Supervision/Assistance  Patient can return home with the following A little help with walking and/or transfers;A lot of help with bathing/dressing/bathroom;Assistance with cooking/housework;Assist for transportation;Help with stairs or ramp for entrance    Functional Status Assessment  Patient has had a recent decline in their functional status and demonstrates the  ability to make significant improvements in function in a reasonable and predictable amount of time.  Equipment Recommendations  BSC/3in1    Recommendations for Other Services       Precautions / Restrictions Precautions Precautions: Back;Fall Precaution Comments: Back precautions for comfort Required Braces or Orthoses: Spinal Brace Spinal Brace: Thoracolumbosacral orthotic;Applied in sitting position Restrictions Weight Bearing Restrictions: No      Mobility Bed Mobility Overal bed mobility: Needs Assistance Bed Mobility: Rolling, Sidelying to Sit Rolling: Min assist Sidelying to sit: Mod assist, +2 for safety/equipment       General bed mobility comments: +2 for safety to progress OOB. cues for log rolling and fair carryover    Transfers Overall transfer level: Needs assistance Equipment used: Rolling walker (2 wheels) Transfers: Sit to/from Stand, Bed to chair/wheelchair/BSC Sit to Stand: Min assist, +2 physical assistance     Step pivot transfers: Min assist, +2 safety/equipment     General transfer comment: Min A x 2 for two standing trials at bedside with cues for hand placement and posture. Min A x 2 for saftey in stepping to chair with cues for pacing but overall improving with continued activity      Balance Overall balance assessment: Needs assistance Sitting-balance support: Feet supported Sitting balance-Leahy Scale: Fair     Standing balance support: Bilateral upper extremity supported, During functional activity Standing balance-Leahy Scale: Poor                             ADL either performed or assessed with clinical judgement   ADL Overall ADL's : Needs assistance/impaired Eating/Feeding: Modified independent   Grooming: Set up;Sitting  Upper Body Bathing: Minimal assistance;Sitting   Lower Body Bathing: Moderate assistance;Sit to/from stand;Sitting/lateral leans   Upper Body Dressing : Minimal assistance;Sitting   Lower  Body Dressing: Moderate assistance;Sitting/lateral leans;Sit to/from stand   Toilet Transfer: Minimal assistance;+2 for safety/equipment;Stand-pivot;Rolling walker (2 wheels);BSC/3in1   Toileting- Clothing Manipulation and Hygiene: Moderate assistance;Sitting/lateral lean;Sit to/from stand         General ADL Comments: Limited primarily by back pain with new supplemental O2 needs d/t PE     Vision Baseline Vision/History: 1 Wears glasses Ability to See in Adequate Light: 0 Adequate Patient Visual Report: No change from baseline Vision Assessment?: No apparent visual deficits     Perception     Praxis      Pertinent Vitals/Pain Pain Assessment Pain Assessment: Faces Faces Pain Scale: Hurts even more Pain Location: intermittent sharp low back pain with certain movements Pain Descriptors / Indicators: Sharp, Shooting, Grimacing Pain Intervention(s): Monitored during session, RN gave pain meds during session, Patient requesting pain meds-RN notified, Repositioned     Hand Dominance Right   Extremity/Trunk Assessment Upper Extremity Assessment Upper Extremity Assessment: Overall WFL for tasks assessed   Lower Extremity Assessment Lower Extremity Assessment: Defer to PT evaluation   Cervical / Trunk Assessment Cervical / Trunk Assessment: Other exceptions Cervical / Trunk Exceptions: L1 compression fx   Communication Communication Communication: No difficulties   Cognition Arousal/Alertness: Awake/alert Behavior During Therapy: WFL for tasks assessed/performed Overall Cognitive Status: Within Functional Limits for tasks assessed                                       General Comments  RN in during session to give pain meds. Discussed with Attending verbally prior to session regarding new PE and appropriateness for therapy today - cleared by MD for OOB attempts    Exercises     Shoulder Instructions      Home Living Family/patient expects to be  discharged to:: Private residence Living Arrangements: Alone Available Help at Discharge: Family;Available PRN/intermittently Type of Home: House Home Access: Stairs to enter [employer] of Steps: 1   Home Layout: One level     Bathroom Shower/Tub: Teacher, early years/pre: Standard     Home Equipment: Conservation officer, nature (2 wheels)          Prior Functioning/Environment Prior Level of Function : Independent/Modified Independent;Driving             Mobility Comments: no AD, cannot walk long distances due to back pain ADLs Comments: Indep with ADLs, IADLs, shopping and driving. Pt reports using shopping cart due to difficulty walking long distances        OT Problem List: Decreased strength;Decreased activity tolerance;Impaired balance (sitting and/or standing);Decreased knowledge of use of DME or AE;Decreased knowledge of precautions;Pain;Cardiopulmonary status limiting activity      OT Treatment/Interventions: Self-care/ADL training;Therapeutic exercise;Energy conservation;DME and/or AE instruction;Therapeutic activities;Patient/family education    OT Goals(Current goals can be found in the care plan section) Acute Rehab OT Goals Patient Stated Goal: pain control, take a few more days to see how i feel, hopeful to go home OT Goal Formulation: With patient Time For Goal Achievement: 06/18/22 Potential to Achieve Goals: Good  OT Frequency: Min 2X/week    Co-evaluation PT/OT/SLP Co-Evaluation/Treatment: Yes Reason for Co-Treatment: For patient/therapist safety;To address functional/ADL transfers;Other (comment) (to progress OOB)   OT goals addressed during session: ADL's and self-care;Strengthening/ROM  AM-PAC OT "6 Clicks" Daily Activity     Outcome Measure Help from another person eating meals?: None Help from another person taking care of personal grooming?: A Little Help from another person toileting, which includes using toliet, bedpan,  or urinal?: A Lot Help from another person bathing (including washing, rinsing, drying)?: A Lot Help from another person to put on and taking off regular upper body clothing?: A Little Help from another person to put on and taking off regular lower body clothing?: A Lot 6 Click Score: 16   End of Session Equipment Utilized During Treatment: Rolling walker (2 wheels);Gait belt;Back brace;Oxygen Nurse Communication: Mobility status  Activity Tolerance: Patient tolerated treatment well Patient left: in chair;with call bell/phone within reach;with chair alarm set (no nurse call cord but activated to alarm in room - RN aware)  OT Visit Diagnosis: Unsteadiness on feet (R26.81);Other abnormalities of gait and mobility (R26.89);Pain Pain - part of body:  (back)                Time: [id number] OT Time Calculation (min): 30 min Charges:  OT General Charges $OT Visit: 1 Visit OT Evaluation $OT Eval Moderate Complexity: 1 Mod  Malachy Chamber, OTR/L Acute Rehab Services Office: [phone]   Layla Maw 06/04/2022, 9:24 AM

## 2022-06-04 NOTE — Progress Notes (Addendum)
PROGRESS NOTE        PATIENT DETAILS Name: Timothy Wood Age: 82 y.o. Sex: male Date of Birth: [birth year] Admit Date: 06/03/2022 Admitting Physician Norval Morton, MD [id number], Malka So, MD  Brief Summary: Patient is a 82 y.o.  male history of VTE, adenocarcinoma of the lung-s/p VATS/wedge resection of right middle/upper lobe-2016, tobacco abuse-who presented with a mechanical fall-he was found to have L1 compression fracture.  He was also found to be hypoxemic-upon further evaluation-he will found to have PE on CT angiogram chest.  Significant events: 3/20>> admit to [facility].  Significant studies: 3/20>> x-ray lumbar/thoracic spine: Age indeterminate L1 superior endplate compression fracture, chronic T3 superior endplate vertebral body height loss, chronic stable L2-L3 superior endplate compression fracture. 3/20>> CTA chest:+ve PE greatest clot burden in the left upper lobe.  Right lower lobe patchy consolidation f  Significant microbiology data: 3/21>> blood culture: Pending  Procedures: None  Consults: None  Subjective: Lying comfortably in bed-denies any chest pain or shortness of breath.  Claims pain in his back is much better.  Objective: Vitals: Blood pressure (!) 159/66, pulse 99, temperature 99 F (37.2 C), temperature source Oral, resp. rate 19, height 5\' 8"  (1.727 m), weight 76 kg, SpO2 93 %.   Exam: Gen Exam:Alert awake-not in any distress HEENT:atraumatic, normocephalic Chest: B/L clear to auscultation anteriorly CVS:S1S2 regular Abdomen:soft non tender, non distended Extremities:no edema Neurology: Non focal Skin: no rash  Pertinent Labs/Radiology:    Latest Ref Rng & Units 06/04/2022    5:01 AM 06/03/2022    8:25 AM 03/19/2022    3:03 PM  CBC  WBC 4.0 - 10.5 K/uL 14.1  12.7  8.6   Hemoglobin 13.0 - 17.0 g/dL 15.0  16.2  16.1   Hematocrit 39.0 - 52.0 % 45.0  49.8  47.0   Platelets 150 - 400 K/uL 170  221  223     Lab  Results  Component Value Date   NA 135 06/04/2022   K 3.6 06/04/2022   CL 102 06/04/2022   CO2 24 06/04/2022      Assessment/Plan: Mechanical fall Presumed acute L1 compression fracture Numerous chronic healed fractures Neurosurgery recommending TLSO brace when OOB, no lifting> 10-15 pounds, follow-up with Dr. Marcello Moores in 2-4 weeks Mobilize with PT/OT  Acute hypoxic respiratory failure due to recurrent pulmonary embolism Prior history of PE 2017, remote history of DVT Per chart review-apparently is on Xarelto-however patient claims that he apparently was "taken off" Xarelto several months back.  No family at bedside to verify history-message left for patient's daughter In any event-he is on IV heparin-he will need to be on indefinite anticoagulation given recurrent history of PE. Await Doppler/echo but he is otherwise hemodynamically stable Attempt titrate of oxygen  Aspiration pneumonia Mild leukocytosis but nontoxic-appearing Could have aspirated when he fell SLP eval Continue empiric antibiotics Follow cultures  HTN BP stable Continue amlodipine/Avapro and Coreg Follow/optimize  PAD-s/p iliac stenting 2010 AAA s/p repair 2012 Continue statin Suspect not on antiplatelets as on anticoagulation  COPD Continue bronchodilators Not in exacerbation  History of lung CA-s/p wedge resection 2016 Follows with Dr. Julien Nordmann Currently on observation  Tobacco abuse Counseled  EtOH use Reported drinking a half a pint of brandy before he fell Denies EtOH use on a daily basis Monitor closely on Ativan per CIWA protocol Currently no signs  of withdrawals  BMI: Estimated body mass index is 25.48 kg/m as calculated from the following:   Height as of this encounter: 5\' 8"  (1.727 m).   Weight as of this encounter: 76 kg.   Code status:   Code Status: Full Code   DVT Prophylaxis:IV heparin   Family Communication: [id number] voicemail on  3/21   Disposition Plan: Status is: Observation The patient will require care spanning > 2 midnights and should be moved to inpatient because: Severity of illness   Planned Discharge Destination:Home   Diet: Diet Order             Diet Heart Room service appropriate? Yes; Fluid consistency: Thin  Diet effective now                     Antimicrobial agents: Anti-infectives (From admission, onward)    Start     Dose/Rate Route Frequency Ordered Stop   06/04/22 0115  azithromycin (ZITHROMAX) 500 mg in sodium chloride 0.9 % 250 mL IVPB        500 mg 250 mL/hr over 60 Minutes Intravenous Every 24 hours 06/04/22 0024     06/04/22 0115  cefTRIAXone (ROCEPHIN) 1 g in sodium chloride 0.9 % 100 mL IVPB        1 g 200 mL/hr over 30 Minutes Intravenous Every 24 hours 06/04/22 0024          MEDICATIONS: Scheduled Meds:  amLODipine  5 mg Oral Daily   carvedilol  6.25 mg Oral BID   DULoxetine  30 mg Oral Daily   folic acid  1 mg Oral Daily   irbesartan  150 mg Oral Daily   LORazepam  0-4 mg Oral Q6H   Followed by   Derrill Memo ON 06/05/2022] LORazepam  0-4 mg Oral Q12H   multivitamin with minerals  1 tablet Oral Daily   nicotine  14 mg Transdermal Daily   pantoprazole  40 mg Oral Daily   rosuvastatin  20 mg Oral Daily   sodium chloride flush  3 mL Intravenous Q12H   thiamine  100 mg Oral Daily   Or   thiamine  100 mg Intravenous Daily   Continuous Infusions:  azithromycin 500 mg (06/04/22 0219)   cefTRIAXone (ROCEPHIN)  IV Stopped (06/04/22 0224)   heparin 1,200 Units/hr (06/04/22 0216)   PRN Meds:.acetaminophen **OR** acetaminophen, albuterol, hydrALAZINE, LORazepam **OR** LORazepam, methocarbamol, ondansetron **OR** ondansetron (ZOFRAN) IV, oxyCODONE   I have personally reviewed following labs and imaging studies  LABORATORY DATA: CBC: Recent Labs  Lab 06/03/22 0825 06/04/22 0501  WBC 12.7* 14.1*  NEUTROABS 10.9*  --   HGB 16.2 15.0  HCT 49.8 45.0  MCV 95.8  93.4  PLT 221 [id number]    Basic Metabolic Panel: Recent Labs  Lab 06/03/22 0825 06/04/22 0501  NA 141 135  K 3.5 3.6  CL 102 102  CO2 25 24  GLUCOSE 102* 96  BUN 11 8  CREATININE 0.94 0.83  CALCIUM 8.0* 8.1*  MG  --  1.3*    GFR: Estimated Creatinine Clearance: 67.5 mL/min (by C-G formula based on SCr of 0.83 mg/dL).  Liver Function Tests: Recent Labs  Lab 06/03/22 0825  AST 19  ALT 17  ALKPHOS 104  BILITOT 0.7  PROT 7.2  ALBUMIN 3.8   No results for input(s): "LIPASE", "AMYLASE" in the last 168 hours. No results for input(s): "AMMONIA" in the last 168 hours.  Coagulation Profile: No results for input(s): "INR", "PROTIME" in the  last 168 hours.  Cardiac Enzymes: Recent Labs  Lab 06/03/22 0825  CKTOTAL 110    BNP (last 3 results) No results for input(s): "PROBNP" in the last 8760 hours.  Lipid Profile: No results for input(s): "CHOL", "HDL", "LDLCALC", "TRIG", "CHOLHDL", "LDLDIRECT" in the last 72 hours.  Thyroid Function Tests: No results for input(s): "TSH", "T4TOTAL", "FREET4", "T3FREE", "THYROIDAB" in the last 72 hours.  Anemia Panel: No results for input(s): "VITAMINB12", "FOLATE", "FERRITIN", "TIBC", "IRON", "RETICCTPCT" in the last 72 hours.  Urine analysis:    Component Value Date/Time   COLORURINE YELLOW 08/05/2015 2348   APPEARANCEUR CLEAR 08/05/2015 2348   LABSPEC 1.024 08/05/2015 2348   PHURINE 5.0 08/05/2015 2348   GLUCOSEU NEGATIVE 08/05/2015 2348   HGBUR SMALL (A) 08/05/2015 2348   BILIRUBINUR NEGATIVE 08/05/2015 2348   BILIRUBINUR n 07/25/2012 1037   KETONESUR NEGATIVE 08/05/2015 2348   PROTEINUR NEGATIVE 08/05/2015 2348   UROBILINOGEN 1.0 01/22/2015 1415   NITRITE POSITIVE (A) 08/05/2015 2348   LEUKOCYTESUR NEGATIVE 08/05/2015 2348    Sepsis Labs: Lactic Acid, Venous    Component Value Date/Time   LATICACIDVEN 1.1 06/04/2022 0808    MICROBIOLOGY: No results found for this or any previous visit (from the past 240  hour(s)).  RADIOLOGY STUDIES/RESULTS: CT Angio Chest Pulmonary Embolism (PE) W or WO Contrast  Result Date: 06/04/2022 CLINICAL DATA:  Pulmonary embolism suspected.  High probability. EXAM: CT ANGIOGRAPHY CHEST WITH CONTRAST TECHNIQUE: Multidetector CT imaging of the chest was performed using the standard protocol during bolus administration of intravenous contrast. Multiplanar CT image reconstructions and MIPs were obtained to evaluate the vascular anatomy. RADIATION DOSE REDUCTION: This exam was performed according to the departmental dose-optimization program which includes automated exposure control, adjustment of the mA and/or kV according to patient size and/or use of iterative reconstruction technique. CONTRAST:  57mL OMNIPAQUE IOHEXOL 350 MG/ML SOLN COMPARISON:  Portable chest today, chest radiograph 08/12/2018, chest CT with contrast 03/19/2022, chest CT with contrast 03/18/2021. FINDINGS: Cardiovascular: On the left, there is linear nonocclusive thrombus in the distal main pulmonary artery and straddling the first division of the left lower lobe artery with scattered downstream subsegmental small-vessel clots in the posterior basal distribution. There is a nonoccluding thrombus partially filling the left upper lobe anterior segmental artery with extension into the first order subsegmental arteries of the vessel. The left lung arteries are otherwise clear. On the right, there is a partially occluding thrombus in the interlobar artery to the middle lobe with nonocclusive thrombus in the medial and lateral segmental main arteries. There are scattered subsegmental arterial clots in the small vessels in the posterior basal right lower lobe distribution. No upper lobe emboli are seen. The heart has mildly enlarged since the prior studies and the pulmonary trunk and main arteries are increased in caliber now upper limit of normal. The RV/LV ratio is upper limits of normal at 0.9 but there is no IVC or  hepatic vein reflux concerning for acute right heart strain. The pulmonary veins are decompressed. There is patchy moderate aortic calcific plaque, mild tortuosity in the descending segment, scattered calcifications in the great vessels but no aneurysm, stenosis or dissection. Stable three-vessel calcific CAD is again seen greatest in the LAD and right coronary arteries. There is no pericardial effusion. Mediastinum/Nodes: No interval change is seen in slightly prominent right hilar lymph nodes up to 1 cm in short axis, subcarinal nodes up to 1.1 cm in short axis, and right paratracheal lymph nodes up to 0.9 cm in  short axis. There is no new or enlarging adenopathy. No thyroid or axillary mass. The thoracic trachea and thoracic esophagus are unremarkable. Lungs/Pleura: There is increased diffuse bronchial thickening. Surgical changes of posterior right upper lobe wedge resection are again shown. The lungs are mildly emphysematous with centrilobular changes predominating. No pleural effusion or pneumothorax is seen. There is increased right lower lobe patchy consolidation, posteriorly in the superior segment and in the posterior basal right lower lobe, which is favored due to pneumonia or aspiration as there is fluid and debris in the distal right main and main lower lobe bronchi. Component of pulmonary infarction could certainly be present as well. There is increased scattered linear atelectasis in the bilateral lung bases but no other confluent consolidation. There is no pleural effusion or pneumothorax. Clustered micronodular disease is again noted posteriorly in the left upper lobe on 7: 41-45 most likely indicates chronic small airways disease. Remaining lungs are generally clear. Upper Abdomen: Stable appearance of multiple small left hepatic cysts. Slight capsular nodularity over portions of the left lobe is again noted in likely indicating early cirrhosis. No splenomegaly or ascites is seen and no portal vein  dilatation. There are no acute upper abdominal findings. Chronic nodular thickening noted both adrenal glands, stable right renal cysts. Musculoskeletal: There are chronic mild upper plate anterior wedge deformities of the T2 and T3 vertebral bodies. Osteopenia and mild thoracic spondylosis. No acute or significant osseous findings or destructive lesions. Review of the MIP images confirms the above findings. IMPRESSION: 1. Positive for bilateral pulmonary emboli with the clot burden greatest in the left upper lobe anterior segmental artery and in the interlobar artery to the middle lobe. Small overall clot burden. 2. Borderline RV/LV ratio but no IVC or hepatic vein reflux concerning for acute right heart strain. The pulmonary trunk and main arteries have become borderline for size. 3. Mild cardiomegaly. 4. Aortic and coronary artery atherosclerosis. 5. COPD with increased diffuse bronchial thickening. 6. Right lower lobe patchy consolidation favored due to pneumonia or aspiration with fluid and debris in the distal right main and main lower lobe bronchi. Component of pulmonary infarction could certainly be present as well. 7. Clustered micronodular disease posteriorly in the left upper lobe most likely due to chronic small airways disease. 8. Stable mildly prominent right hilar and mediastinal lymph nodes. 9. Stable left hepatic cysts and slight capsular nodularity over the left lobe of the liver which could be due to early cirrhosis. No splenomegaly or ascites. 10. Chronic nodular thickening of both adrenal glands. 11. Osteopenia and degenerative change. Chronic mild T2 and T3 compression fractures. Aortic Atherosclerosis (ICD10-I70.0). Electronically Signed   By: Telford Nab M.D.   On: 06/04/2022 00:08   DG Chest 1 View  Result Date: 06/03/2022 CLINICAL DATA:  Pain after fall EXAM: CHEST  1 VIEW COMPARISON:  08/12/2018 x-ray.  CT 03/19/2022 FINDINGS: There is some scarring atelectatic changes in the right  mid to lower lung. Surgical changes noted on the prior CT scan. No consolidation, pneumothorax or effusion. No edema. Normal cardiopericardial silhouette. Overlapping cardiac leads IMPRESSION: No acute cardiopulmonary disease. Electronically Signed   By: Jill Side M.D.   On: 06/03/2022 12:06   DG Lumbar Spine Complete  Result Date: 06/03/2022 CLINICAL DATA:  fall upper lumbar / lower thoracic pain; 190176 Fall 190176 EXAM: LUMBAR SPINE - COMPLETE 4+ VIEW; THORACIC SPINE 2 VIEWS COMPARISON:  CT chest 03/19/2022, x-ray lumbar spine 10/23/2019 FINDINGS: Limited evaluation due to overlapping osseous structures and overlying  soft tissues. There is no evidence of thoracic spine fracture. Chronic T3 superior endplate vertebral body height loss not well visualized due to overlying osseous structures. Interval development of an age-indeterminate L1 superior endplate compression fracture with at least 40% vertebral body height loss. Chronic stable L2 through L3 superior endplate compression fractures. Multilevel mild degenerative changes of the thoracic spine. Multilevel moderate degenerative changes of the lumbar spine with associated moderate severe intervertebral disc space narrowing at the L4-L5 L5-S1 levels. Alignment is normal. Atherosclerotic plaque. Aortoiliac stent. IMPRESSION: 1. Interval development of an age-indeterminate L1 superior endplate compression fracture with at least 40% vertebral body height loss. 2. No acute displaced fracture or traumatic listhesis of the thoracic spine. 3. Chronic T3 superior endplate vertebral body height loss not well visualized due to overlying osseous structures. 4. Chronic stable L2 through L3 superior endplate compression fractures. 5. Limited evaluation due to overlapping osseous structures and overlying soft tissues. 6.  Aortic Atherosclerosis (ICD10-I70.0). Aortoiliac stent. Electronically Signed   By: Iven Finn M.D.   On: 06/03/2022 09:39   DG Thoracic Spine  2 View  Result Date: 06/03/2022 CLINICAL DATA:  fall upper lumbar / lower thoracic pain; 190176 Fall 190176 EXAM: LUMBAR SPINE - COMPLETE 4+ VIEW; THORACIC SPINE 2 VIEWS COMPARISON:  CT chest 03/19/2022, x-ray lumbar spine 10/23/2019 FINDINGS: Limited evaluation due to overlapping osseous structures and overlying soft tissues. There is no evidence of thoracic spine fracture. Chronic T3 superior endplate vertebral body height loss not well visualized due to overlying osseous structures. Interval development of an age-indeterminate L1 superior endplate compression fracture with at least 40% vertebral body height loss. Chronic stable L2 through L3 superior endplate compression fractures. Multilevel mild degenerative changes of the thoracic spine. Multilevel moderate degenerative changes of the lumbar spine with associated moderate severe intervertebral disc space narrowing at the L4-L5 L5-S1 levels. Alignment is normal. Atherosclerotic plaque. Aortoiliac stent. IMPRESSION: 1. Interval development of an age-indeterminate L1 superior endplate compression fracture with at least 40% vertebral body height loss. 2. No acute displaced fracture or traumatic listhesis of the thoracic spine. 3. Chronic T3 superior endplate vertebral body height loss not well visualized due to overlying osseous structures. 4. Chronic stable L2 through L3 superior endplate compression fractures. 5. Limited evaluation due to overlapping osseous structures and overlying soft tissues. 6.  Aortic Atherosclerosis (ICD10-I70.0). Aortoiliac stent. Electronically Signed   By: Iven Finn M.D.   On: 06/03/2022 09:39     LOS: 0 days   Oren Binet, MD  [facility]    To contact the attending provider between 7A-7P or the covering provider during after hours 7P-7A, please log into the web site [email or web address] and access using universal [place] password for that web site. If you do not have the password, please call the hospital  operator.  06/04/2022, 9:47 AM

## 2022-06-05 ENCOUNTER — Other Ambulatory Visit (HOSPITAL_COMMUNITY): Payer: Self-pay

## 2022-06-05 ENCOUNTER — Inpatient Hospital Stay (HOSPITAL_COMMUNITY): Payer: Medicare PPO

## 2022-06-05 DIAGNOSIS — M4856XA Collapsed vertebra, not elsewhere classified, lumbar region, initial encounter for fracture: Secondary | ICD-10-CM | POA: Diagnosis not present

## 2022-06-05 DIAGNOSIS — Z789 Other specified health status: Secondary | ICD-10-CM | POA: Diagnosis not present

## 2022-06-05 DIAGNOSIS — I2699 Other pulmonary embolism without acute cor pulmonale: Secondary | ICD-10-CM

## 2022-06-05 DIAGNOSIS — W19XXXA Unspecified fall, initial encounter: Secondary | ICD-10-CM | POA: Diagnosis not present

## 2022-06-05 DIAGNOSIS — J9601 Acute respiratory failure with hypoxia: Secondary | ICD-10-CM | POA: Diagnosis not present

## 2022-06-05 LAB — CBC
HCT: 43.6 % (ref 39.0–52.0)
Hemoglobin: 14.4 g/dL (ref 13.0–17.0)
MCH: 31.1 pg (ref 26.0–34.0)
MCHC: 33 g/dL (ref 30.0–36.0)
MCV: 94.2 fL (ref 80.0–100.0)
Platelets: 149 10*3/uL — ABNORMAL LOW (ref 150–400)
RBC: 4.63 MIL/uL (ref 4.22–5.81)
RDW: 13.1 % (ref 11.5–15.5)
WBC: 8.1 10*3/uL (ref 4.0–10.5)
nRBC: 0 % (ref 0.0–0.2)

## 2022-06-05 LAB — HEPARIN LEVEL (UNFRACTIONATED): Heparin Unfractionated: 0.41 IU/mL (ref 0.30–0.70)

## 2022-06-05 MED ORDER — APIXABAN 5 MG PO TABS: 5.0000 mg | ORAL_TABLET | Freq: Two times a day (BID) | ORAL | Status: DC

## 2022-06-05 MED ORDER — AMOXICILLIN-POT CLAVULANATE 875-125 MG PO TABS
1.0000 | ORAL_TABLET | Freq: Two times a day (BID) | ORAL | Status: AC
  Administered 2022-06-05 – 2022-06-08 (×7): 1 via ORAL
  Filled 2022-06-05 (×7): qty 1

## 2022-06-05 MED ORDER — APIXABAN 5 MG PO TABS
10.0000 mg | ORAL_TABLET | Freq: Two times a day (BID) | ORAL | Status: DC
  Administered 2022-06-05 – 2022-06-09 (×9): 10 mg via ORAL
  Filled 2022-06-05 (×9): qty 2

## 2022-06-05 MED ORDER — ACETAMINOPHEN 500 MG PO TABS
1000.0000 mg | ORAL_TABLET | Freq: Three times a day (TID) | ORAL | Status: DC
  Administered 2022-06-05 – 2022-06-09 (×13): 1000 mg via ORAL
  Filled 2022-06-05 (×13): qty 2

## 2022-06-05 NOTE — Progress Notes (Signed)
Physical Therapy Treatment Patient Details Name: Timothy Wood MRN: [id number] DOB: Jun 08, 1940 Today's Date: 06/05/2022   History of Present Illness Pt is a 82 y.o. M who presents 06/02/2021 after a fall with new oxygen requirement and mild L1 compression fracture and chronic, healed fractures. Pt also found to have PE. Significant PMH: lung CA, COPD, back pain.    PT Comments    Pt tolerated today's session fairly, remaining limited by pain but tolerating 2 transfers. Pt with improved performance of all mobility, tolerating bed mobility and transfers with one person assist, continue to recommend a second person for ambulation progression. Cueing still required for proper technique and hand placement with min-modA needed and use of RW, seated rest break provided between transfers due to fatigue and pain. Pt with desat to 86-87% recovering with mild increase of O2 to 4 L and cueing for deep breathing, once recovered to low 90s, pt tolerating O2 back at 3.5 L. Acute PT will continue to follow pt to progress mobility as pain allows, discharge plans remain appropriate.     Recommendations for follow up therapy are one component of a multi-disciplinary discharge planning process, led by the attending physician.  Recommendations may be updated based on patient status, additional functional criteria and insurance authorization.  Follow Up Recommendations  Skilled nursing-short term rehab (<3 hours/day) (could progress pending pain) Can patient physically be transported by private vehicle: No   Assistance Recommended at Discharge Frequent or constant Supervision/Assistance  Patient can return home with the following Help with stairs or ramp for entrance;Assist for transportation;A lot of help with walking and/or transfers   Equipment Recommendations  BSC/3in1    Recommendations for Other Services       Precautions / Restrictions Precautions Precautions: Back;Fall Precaution Comments: Back  precautions for comfort Required Braces or Orthoses: Spinal Brace Spinal Brace: Thoracolumbosacral orthotic;Applied in sitting position Restrictions Weight Bearing Restrictions: No     Mobility  Bed Mobility Overal bed mobility: Needs Assistance Bed Mobility: Rolling, Sidelying to Sit, Sit to Sidelying Rolling: Min assist Sidelying to sit: Min assist     Sit to sidelying: Min assist General bed mobility comments: cueing for proper technique with use of bed rail, assist for trunk support with rolling and sidelying to sit, assist for BLE management with sit to sidelying    Transfers Overall transfer level: Needs assistance Equipment used: Rolling walker (2 wheels) Transfers: Sit to/from Stand, Bed to chair/wheelchair/BSC Sit to Stand: Mod assist   Step pivot transfers: Min assist       General transfer comment: modA to stand from bed and chair, cueing for hand placement and to decreased trunk flexion prior to standing, able to take a few steps to/from the bed with minA    Ambulation/Gait Ambulation/Gait assistance: Min assist Gait Distance (Feet): 3 Feet (x2 feet second trial) Assistive device: Rolling walker (2 wheels) Gait Pattern/deviations: Step-through pattern, Decreased stride length, Shuffle, Drifts right/left, Decreased dorsiflexion - right, Decreased dorsiflexion - left, Knee flexed in stance - right, Knee flexed in stance - left Gait velocity: decreased     General Gait Details: pt with crouched posture likely due to pain, shuffled steps with cueing provided for upright posture. Only ambulating a few steps to/from the bed remaining limited by pain   Stairs             Wheelchair Mobility    Modified Rankin (Stroke Patients Only)       Balance Overall balance assessment: Needs assistance Sitting-balance support:  Feet supported, Bilateral upper extremity supported Sitting balance-Leahy Scale: Fair Sitting balance - Comments: reliant on UE support  due to pain   Standing balance support: Bilateral upper extremity supported, During functional activity Standing balance-Leahy Scale: Poor Standing balance comment: reliant on UE support for transfer                            Cognition Arousal/Alertness: Awake/alert Behavior During Therapy: WFL for tasks assessed/performed Overall Cognitive Status: Within Functional Limits for tasks assessed                                 General Comments: occasional inappropriate comments but alert and oriented        Exercises      General Comments General comments (skin integrity, edema, etc.): 3.5L O2 during session, maintaining 88% or greater with transfers but once back in supine desatting to 86-87%, titrated up to 4L and cueing for deep breathing, pt recovering to 92% and tolerating being placed back on 3.5L      Pertinent Vitals/Pain Pain Assessment Pain Assessment: Faces Faces Pain Scale: Hurts even more Pain Location: back pain with transitions Pain Descriptors / Indicators: Sharp, Shooting, Grimacing Pain Intervention(s): Limited activity within patient's tolerance, Monitored during session, Repositioned    Home Living                          Prior Function            PT Goals (current goals can now be found in the care plan section) Acute Rehab PT Goals Patient Stated Goal: less pain PT Goal Formulation: With patient Time For Goal Achievement: 06/17/22 Potential to Achieve Goals: Good Progress towards PT goals: Progressing toward goals    Frequency    Min 5X/week      PT Plan Current plan remains appropriate    Co-evaluation              AM-PAC PT "6 Clicks" Mobility   Outcome Measure  Help needed turning from your back to your side while in a flat bed without using bedrails?: A Little Help needed moving from lying on your back to sitting on the side of a flat bed without using bedrails?: A Little Help needed  moving to and from a bed to a chair (including a wheelchair)?: A Lot Help needed standing up from a chair using your arms (e.g., wheelchair or bedside chair)?: A Lot Help needed to walk in hospital room?: A Lot Help needed climbing 3-5 steps with a railing? : Total 6 Click Score: 13    End of Session Equipment Utilized During Treatment: Gait belt;Oxygen Activity Tolerance: Patient limited by pain Patient left: in bed;with call bell/phone within reach;with bed alarm set Nurse Communication: Mobility status PT Visit Diagnosis: Pain;Difficulty in walking, not elsewhere classified (R26.2) Pain - part of body:  (back)     Time: [id number] PT Time Calculation (min) (ACUTE ONLY): 19 min  Charges:  $Therapeutic Activity: 8-22 mins                     Charlynne Cousins, PT DPT Acute Rehabilitation Services Office [phone]    Luvenia Heller 06/05/2022, 1:29 PM

## 2022-06-05 NOTE — Plan of Care (Signed)
OOB to restroom with assistance and TLSO brace. Remains on oxygen. Daughter visited. Transitioned from heparin to Eliquis oral. No medication needed for CIWA score.   Problem: Education: Goal: Knowledge of General Education information will improve Description: Including pain rating scale, medication(s)/side effects and non-pharmacologic comfort measures Outcome: Progressing   Problem: Health Behavior/Discharge Planning: Goal: Ability to manage health-related needs will improve Outcome: Progressing   Problem: Clinical Measurements: Goal: Ability to maintain clinical measurements within normal limits will improve Outcome: Progressing   Problem: Activity: Goal: Risk for activity intolerance will decrease Outcome: Progressing

## 2022-06-05 NOTE — TOC Progression Note (Addendum)
Transition of Care [facility]) - Progression Note    Patient Details  Name: Timothy Wood MRN: [id number] Date of Birth: 02/16/1941  Transition of Care [facility]) CM/SW [place], LCSW Phone Number: 06/05/2022, 1:11 PM  Clinical Narrative:    1pm-Daughter and patient in agreement for SNF rehab. Will send out referral for review. Current barrier includes CIWA needing to be 0 and no PRN meds for withdrawal for 48 hours.   4pm-CSW received call from patient's daughter and discussed SNF process. She is hopeful for a [place] facility since she works in [place]. Will continue to update her on SNF bed offers as they come available.     Expected Discharge Plan: Skilled Nursing Facility Barriers to Discharge: Ship broker, Continued Medical Work up, SNF Pending bed offer  Expected Discharge Plan and Services In-house Referral: Clinical Social Work     Living arrangements for the past 2 months: Single Family Home                                       Social Determinants of Health (SDOH) Interventions SDOH Screenings   Food Insecurity: No Food Insecurity (05/10/2022)  Housing: Low Risk  (05/10/2022)  Transportation Needs: No Transportation Needs (05/10/2022)  Utilities: Not At Risk (05/10/2022)  Alcohol Screen: Low Risk  (05/08/2020)  Depression (PHQ2-9): Low Risk  (11/03/2021)  Recent Concern: Depression (PHQ2-9) - High Risk (10/13/2021)  Financial Resource Strain: Low Risk  (11/03/2021)  Physical Activity: Inactive (11/03/2021)  Social Connections: Socially Isolated (05/08/2020)  Stress: Stress Concern Present (11/03/2021)  Tobacco Use: High Risk (06/03/2022)    Readmission Risk Interventions     No data to display

## 2022-06-05 NOTE — Plan of Care (Signed)

## 2022-06-05 NOTE — Progress Notes (Signed)
Lower extremity venous bilateral study completed.  Preliminary results relayed to Sloan Leiter, MD and Karlene Einstein, RN.   See CV Proc for preliminary results report.   Darlin Coco, RDMS, RVT

## 2022-06-05 NOTE — Progress Notes (Signed)
ANTICOAGULATION CONSULT NOTE  Pharmacy Consult for heparin Indication: pulmonary embolus  Allergies  Allergen Reactions   Codeine Anaphylaxis   Hydrocodone Itching    Patient Measurements: Height: 5\' 8"  (172.7 cm) Weight: 76 kg (167 lb 8.8 oz) IBW/kg (Calculated) : 68.4  Vital Signs: Temp: 98 F (36.7 C) (03/22 0822) Temp Source: Oral (03/22 0822) BP: 167/81 (03/22 0822) Pulse Rate: 84 (03/22 0822)  Labs: Recent Labs    06/03/22 0825 06/04/22 0501 06/04/22 0808 06/04/22 2022 06/05/22 0615  HGB 16.2 15.0  --   --  14.4  HCT 49.8 45.0  --   --  43.6  PLT 221 170  --   --  149*  APTT  --   --  72*  --   --   HEPARINUNFRC  --   --  0.26* 0.32 0.41  CREATININE 0.94 0.83  --   --   --   CKTOTAL 110  --   --   --   --      Estimated Creatinine Clearance: 67.5 mL/min (by C-G formula based on SCr of 0.83 mg/dL).  Assessment: 82yo male presented to ED 3/20 am after falling outside and experiencing back pain, found to have sustained L1 compression fx as well as hypoxia, CT now reveals bilateral PE with overall small clot burden >> to begin heparin.  Of note pt takes low-dose Xarelto for h/o VTE, states he usually takes it at 10a and last dose was likely 3/19 although there is no seen refill history for this medication.  Heparin level is therapeutic at 0.41 on 1400 units/hr. No bleeding noted, Hgb stable, platelets are trending down (221 >> 149).   Goal of Therapy:  Heparin level 0.3-0.7 units/ml Monitor platelets by anticoagulation protocol: Yes   Plan:  Continue heparin infusion at 1400 units/hr Monitor daily heparin level, CBC Monitor for s/sx of bleeding  Thank you for involving pharmacy in this patient's care.  Renold Genta, PharmD, BCPS Clinical Pharmacist Clinical phone for 06/05/2022 is [phone] 06/05/2022 8:53 AM

## 2022-06-05 NOTE — Progress Notes (Addendum)
PROGRESS NOTE        PATIENT DETAILS Name: Timothy Wood Age: 82 y.o. Sex: male Date of Birth: November 05, 1940 Admit Date: 06/03/2022 Admitting Physician Evalee Mutton Kristeen Mans, MD PC:[id number], Malka So, MD  Brief Summary: Patient is a 82 y.o.  male history of VTE, adenocarcinoma of the lung-s/p VATS/wedge resection of right middle/upper lobe-2016, tobacco abuse-who presented with a mechanical fall-he was found to have L1 compression fracture.  He was also found to be hypoxemic-upon further evaluation-he was found to have PE on CT angiogram chest.  Significant events: 3/20>> admit to [facility].  Significant studies: 3/20>> x-ray lumbar/thoracic spine: Age indeterminate L1 superior endplate compression fracture, chronic T3 superior endplate vertebral body height loss, chronic stable L2-L3 superior endplate compression fracture. 3/20>> CTA chest:+ve PE greatest clot burden in the left upper lobe.  Right lower lobe patchy consolidation  3/21>> echo: EF [id number], RV systolic function normal. 3/22>> bilateral lower extremity Doppler:acute, occlusive DVT in the left distal femoral vein, popliteal vein, and extending into the gastroc and posterior tibial veins (verbal per technician)  Significant microbiology data: 3/21>> blood culture: neg  Procedures: None  Consults: None  Subjective: Lying comfortably in bed.  No chest pain or shortness of breath-down to 2-2 L of oxygen.  Pharmacist was able to call patient's pharmacy [facility] [address])-patient has not filled his Xarelto since August 2023.  Per patient-cost was around $140-therefore he never filled it.  It does not appear that he has been taking anticoagulation for the past several months.  Objective: Vitals: Blood pressure (!) 167/81, pulse 84, temperature 98 F (36.7 C), temperature source Oral, resp. rate 13, height 5\' 8"  (1.727 m), weight 76 kg, SpO2 93 %.   Exam: Gen Exam:Alert awake-not in any  distress HEENT:atraumatic, normocephalic Chest: B/L clear to auscultation anteriorly CVS:S1S2 regular Abdomen:soft non tender, non distended Extremities:no edema Neurology: Non focal Skin: no rash  Pertinent Labs/Radiology:    Latest Ref Rng & Units 06/05/2022    6:15 AM 06/04/2022    5:01 AM 06/03/2022    8:25 AM  CBC  WBC 4.0 - 10.5 K/uL 8.1  14.1  12.7   Hemoglobin 13.0 - 17.0 g/dL 14.4  15.0  16.2   Hematocrit 39.0 - 52.0 % 43.6  45.0  49.8   Platelets 150 - 400 K/uL 149  170  221     Lab Results  Component Value Date   NA 135 06/04/2022   K 3.6 06/04/2022   CL 102 06/04/2022   CO2 24 06/04/2022      Assessment/Plan: Mechanical fall Presumed acute L1 compression fracture Numerous chronic healed fractures Neurosurgery recommending TLSO brace when OOB, no lifting> 10-15 pounds, follow-up with Dr. Marcello Moores in 2-4 weeks Mobilize with PT/OT Continue scheduled Tylenol As needed oxycodone for moderate pain, and as needed fentanyl for severe pain SNF planned n discharge  Acute hypoxic respiratory failure  Recurrent pulmonary embolism LLE DVT on Doppler 3/22 Aspiration pneumonia Prior history of PE 2017, remote history of DVT This current episode of VTE is due to noncompliance-does not appear that he has been taking Xarelto since August 2023-confirmed by calling his pharmacy on 3/22 Echo stable without RV strain Doppler with LLE DVT (prelim result per technician) Stop IV heparin-transition to Eliquis.Needs indefinite anticoagulation given recurrent VTE Stop IV Rocephin/Zithromax-transition to Augmentin-x 5 days total. Continue attempts to titrate  down FiO2   HTN BP stable Continue amlodipine/Avapro and Coreg Follow/optimize  PAD-s/p iliac stenting 2010 AAA s/p repair 2012 Continue statin Suspect not on antiplatelets as on anticoagulation  COPD Continue bronchodilators Not in exacerbation  History of lung CA-s/p wedge resection 2016 Follows with Dr.  Julien Nordmann Currently on observation  Tobacco abuse Counseled  EtOH use Reported drinking a half a pint of brandy before he fell Denies EtOH use on a daily basis Monitor closely on Ativan per CIWA protocol Currently no signs of withdrawals  BMI: Estimated body mass index is 25.48 kg/m as calculated from the following:   Height as of this encounter: 5\' 8"  (1.727 m).   Weight as of this encounter: 76 kg.   Code status:   Code Status: Full Code   DVT Prophylaxis:IV heparin   Family Communication: [id number] 3/22   Disposition Plan: Status is: Observation The patient will require care spanning > 2 midnights and should be moved to inpatient because: Severity of illness   Planned Discharge Destination:SNF   Diet: Diet Order             Diet Heart Room service appropriate? Yes; Fluid consistency: Thin  Diet effective now                     Antimicrobial agents: Anti-infectives (From admission, onward)    Start     Dose/Rate Route Frequency Ordered Stop   06/05/22 2200  amoxicillin-clavulanate (AUGMENTIN) 875-125 MG per tablet 1 tablet        1 tablet Oral Every 12 hours 06/05/22 0858 06/09/22 0959   06/04/22 0115  azithromycin (ZITHROMAX) 500 mg in sodium chloride 0.9 % 250 mL IVPB  Status:  Discontinued        500 mg 250 mL/hr over 60 Minutes Intravenous Every 24 hours 06/04/22 0024 06/05/22 0858   06/04/22 0115  cefTRIAXone (ROCEPHIN) 1 g in sodium chloride 0.9 % 100 mL IVPB  Status:  Discontinued        1 g 200 mL/hr over 30 Minutes Intravenous Every 24 hours 06/04/22 0024 06/05/22 0858        MEDICATIONS: Scheduled Meds:  amLODipine  5 mg Oral Daily   amoxicillin-clavulanate  1 tablet Oral Q12H   apixaban  10 mg Oral BID   Followed by   Derrill Memo ON 06/12/2022] apixaban  5 mg Oral BID   carvedilol  6.25 mg Oral BID   DULoxetine  30 mg Oral Daily   folic acid  1 mg Oral Daily   irbesartan  150 mg Oral Daily   LORazepam  0-4 mg  Oral Q6H   Followed by   LORazepam  0-4 mg Oral Q12H   multivitamin with minerals  1 tablet Oral Daily   nicotine  21 mg Transdermal Daily   pantoprazole  40 mg Oral Daily   rosuvastatin  20 mg Oral Daily   sodium chloride flush  3 mL Intravenous Q12H   thiamine  100 mg Oral Daily   Or   thiamine  100 mg Intravenous Daily   Continuous Infusions:   PRN Meds:.acetaminophen **OR** acetaminophen, albuterol, fentaNYL (SUBLIMAZE) injection, hydrALAZINE, LORazepam **OR** LORazepam, methocarbamol, ondansetron **OR** ondansetron (ZOFRAN) IV, oxyCODONE   I have personally reviewed following labs and imaging studies  LABORATORY DATA: CBC: Recent Labs  Lab 06/03/22 0825 06/04/22 0501 06/05/22 0615  WBC 12.7* 14.1* 8.1  NEUTROABS 10.9*  --   --   HGB 16.2 15.0 14.4  HCT 49.8 45.0 43.6  MCV 95.8 93.4 94.2  PLT 221 170 149*     Basic Metabolic Panel: Recent Labs  Lab 06/03/22 0825 06/04/22 0501  NA 141 135  K 3.5 3.6  CL 102 102  CO2 25 24  GLUCOSE 102* 96  BUN 11 8  CREATININE 0.94 0.83  CALCIUM 8.0* 8.1*  MG  --  1.3*     GFR: Estimated Creatinine Clearance: 67.5 mL/min (by C-G formula based on SCr of 0.83 mg/dL).  Liver Function Tests: Recent Labs  Lab 06/03/22 0825  AST 19  ALT 17  ALKPHOS 104  BILITOT 0.7  PROT 7.2  ALBUMIN 3.8    No results for input(s): "LIPASE", "AMYLASE" in the last 168 hours. No results for input(s): "AMMONIA" in the last 168 hours.  Coagulation Profile: No results for input(s): "INR", "PROTIME" in the last 168 hours.  Cardiac Enzymes: Recent Labs  Lab 06/03/22 0825  CKTOTAL 110     BNP (last 3 results) No results for input(s): "PROBNP" in the last 8760 hours.  Lipid Profile: No results for input(s): "CHOL", "HDL", "LDLCALC", "TRIG", "CHOLHDL", "LDLDIRECT" in the last 72 hours.  Thyroid Function Tests: No results for input(s): "TSH", "T4TOTAL", "FREET4", "T3FREE", "THYROIDAB" in the last 72 hours.  Anemia  Panel: No results for input(s): "VITAMINB12", "FOLATE", "FERRITIN", "TIBC", "IRON", "RETICCTPCT" in the last 72 hours.  Urine analysis:    Component Value Date/Time   COLORURINE YELLOW 08/05/2015 2348   APPEARANCEUR CLEAR 08/05/2015 2348   LABSPEC 1.024 08/05/2015 2348   PHURINE 5.0 08/05/2015 2348   GLUCOSEU NEGATIVE 08/05/2015 2348   HGBUR SMALL (A) 08/05/2015 2348   BILIRUBINUR NEGATIVE 08/05/2015 2348   BILIRUBINUR n 07/25/2012 1037   KETONESUR NEGATIVE 08/05/2015 2348   PROTEINUR NEGATIVE 08/05/2015 2348   UROBILINOGEN 1.0 01/22/2015 1415   NITRITE POSITIVE (A) 08/05/2015 2348   LEUKOCYTESUR NEGATIVE 08/05/2015 2348    Sepsis Labs: Lactic Acid, Venous    Component Value Date/Time   LATICACIDVEN 1.1 06/04/2022 0808    MICROBIOLOGY: Recent Results (from the past 240 hour(s))  Culture, blood (Routine X 2) w Reflex to ID Panel     Status: None (Preliminary result)   Collection Time: 06/04/22  4:46 AM   Specimen: BLOOD  Result Value Ref Range Status   Specimen Description BLOOD LEFT ANTECUBITAL  Final   Special Requests   Final    BOTTLES DRAWN AEROBIC AND ANAEROBIC Blood Culture adequate volume   Culture   Final    NO GROWTH 1 DAY Performed at [facility], [place] [address] [place], [place] [address]    Report Status PENDING  Incomplete  Culture, blood (Routine X 2) w Reflex to ID Panel     Status: None (Preliminary result)   Collection Time: 06/04/22  4:49 AM   Specimen: BLOOD RIGHT HAND  Result Value Ref Range Status   Specimen Description BLOOD RIGHT HAND  Final   Special Requests   Final    BOTTLES DRAWN AEROBIC AND ANAEROBIC Blood Culture adequate volume   Culture   Final    NO GROWTH 1 DAY Performed at [facility], [place] [address], [place], [place] [address]    Report Status PENDING  Incomplete    RADIOLOGY STUDIES/RESULTS: ECHOCARDIOGRAM COMPLETE  Result Date: 06/04/2022    ECHOCARDIOGRAM REPORT   Patient Name:   Timothy Wood Date of  Exam: 06/04/2022 Medical Rec #:  [id number]        Height:  68.0 in Accession #:    [id number]       Weight:       167.5 lb Date of Birth:  [birth year]       BSA:          1.896 m Patient Age:    38 years         BP:           159/66 mmHg Patient Gender: M                HR:           87 bpm. Exam Location:  Inpatient Procedure: 2D Echo, Color Doppler and Cardiac Doppler Indications:    Pulmonary Embolus  History:        Patient has prior history of Echocardiogram examinations, most                 recent 04/05/2020. CAD, COPD, Signs/Symptoms:Syncope; Risk                 Factors:Hypertension, Dyslipidemia and Former Smoker. DVT, Lung                 Cancer.  Sonographer:    Rolla Etienne Referring Phys: [id number] Rhetta Mura  Sonographer Comments: Image acquisition challenging due to COPD. IMPRESSIONS  1. Left ventricular ejection fraction, by estimation, is 60 to 65%. The left ventricle has normal function. The left ventricle has no regional wall motion abnormalities. Left ventricular diastolic parameters are consistent with Grade I diastolic dysfunction (impaired relaxation).  2. Right ventricular systolic function is normal. The right ventricular size is normal.  3. The mitral valve is normal in structure. No evidence of mitral valve regurgitation. No evidence of mitral stenosis.  4. The aortic valve is normal in structure. Aortic valve regurgitation is not visualized. Aortic valve sclerosis is present, with no evidence of aortic valve stenosis.  5. The inferior vena cava is normal in size with greater than 50% respiratory variability, suggesting right atrial pressure of 3 mmHg. FINDINGS  Left Ventricle: Left ventricular ejection fraction, by estimation, is 60 to 65%. The left ventricle has normal function. The left ventricle has no regional wall motion abnormalities. The left ventricular internal cavity size was normal in size. There is  no left ventricular hypertrophy. Left ventricular diastolic  parameters are consistent with Grade I diastolic dysfunction (impaired relaxation). Right Ventricle: The right ventricular size is normal. No increase in right ventricular wall thickness. Right ventricular systolic function is normal. Left Atrium: Left atrial size was normal in size. Right Atrium: Right atrial size was normal in size. Pericardium: There is no evidence of pericardial effusion. Presence of epicardial fat layer. Mitral Valve: The mitral valve is normal in structure. No evidence of mitral valve regurgitation. No evidence of mitral valve stenosis. Tricuspid Valve: The tricuspid valve is normal in structure. Tricuspid valve regurgitation is not demonstrated. No evidence of tricuspid stenosis. Aortic Valve: The aortic valve is normal in structure. Aortic valve regurgitation is not visualized. Aortic valve sclerosis is present, with no evidence of aortic valve stenosis. Aortic valve mean gradient measures 3.0 mmHg. Aortic valve peak gradient measures 6.5 mmHg. Aortic valve area, by VTI measures 3.17 cm. Pulmonic Valve: The pulmonic valve was normal in structure. Pulmonic valve regurgitation is not visualized. No evidence of pulmonic stenosis. Aorta: The aortic root is normal in size and structure. Venous: The inferior vena cava is normal in size with greater than 50% respiratory variability, suggesting right atrial pressure  of 3 mmHg. IAS/Shunts: No atrial level shunt detected by color flow Doppler.  LEFT VENTRICLE PLAX 2D LVIDd:         4.40 cm     Diastology LVIDs:         2.70 cm     LV e' medial:    7.18 cm/s LV PW:         1.10 cm     LV E/e' medial:  8.0 LV IVS:        1.10 cm     LV e' lateral:   5.98 cm/s LVOT diam:     2.20 cm     LV E/e' lateral: 9.5 LV SV:         67 LV SV Index:   35 LVOT Area:     3.80 cm  LV Volumes (MOD) LV vol d, MOD A2C: 33.7 ml LV vol d, MOD A4C: 58.8 ml LV vol s, MOD A2C: 14.1 ml LV vol s, MOD A4C: 20.3 ml LV SV MOD A2C:     19.6 ml LV SV MOD A4C:     58.8 ml LV SV MOD  BP:      28.1 ml RIGHT VENTRICLE RV Basal diam:  2.70 cm RV Mid diam:    2.50 cm RV S prime:     19.10 cm/s TAPSE (M-mode): 1.9 cm LEFT ATRIUM             Index        RIGHT ATRIUM           Index LA diam:        3.30 cm 1.74 cm/m   RA Area:     11.40 cm LA Vol (A2C):   20.4 ml 10.76 ml/m  RA Volume:   21.50 ml  11.34 ml/m LA Vol (A4C):   25.8 ml 13.61 ml/m LA Biplane Vol: 23.0 ml 12.13 ml/m  AORTIC VALVE AV Area (Vmax):    2.89 cm AV Area (Vmean):   2.82 cm AV Area (VTI):     3.17 cm AV Vmax:           127.00 cm/s AV Vmean:          86.000 cm/s AV VTI:            0.211 m AV Peak Grad:      6.5 mmHg AV Mean Grad:      3.0 mmHg LVOT Vmax:         96.40 cm/s LVOT Vmean:        63.900 cm/s LVOT VTI:          0.176 m LVOT/AV VTI ratio: 0.83 MITRAL VALVE               TRICUSPID VALVE MV Area (PHT): 3.60 cm    TR Peak grad:   23.2 mmHg MV Decel Time: 211 msec    TR Vmax:        241.00 cm/s MV E velocity: 57.10 cm/s MV A velocity: 83.30 cm/s  SHUNTS MV E/A ratio:  0.69        Systemic VTI:  0.18 m                            Systemic Diam: 2.20 cm Kardie Tobb DO Electronically signed by Berniece Salines DO Signature Date/Time: 06/04/2022/4:45:52 PM    Final    CT Angio Chest Pulmonary Embolism (PE) W or WO Contrast  Result  Date: 06/04/2022 CLINICAL DATA:  Pulmonary embolism suspected.  High probability. EXAM: CT ANGIOGRAPHY CHEST WITH CONTRAST TECHNIQUE: Multidetector CT imaging of the chest was performed using the standard protocol during bolus administration of intravenous contrast. Multiplanar CT image reconstructions and MIPs were obtained to evaluate the vascular anatomy. RADIATION DOSE REDUCTION: This exam was performed according to the departmental dose-optimization program which includes automated exposure control, adjustment of the mA and/or kV according to patient size and/or use of iterative reconstruction technique. CONTRAST:  22mL OMNIPAQUE IOHEXOL 350 MG/ML SOLN COMPARISON:  Portable chest today, chest  radiograph 08/12/2018, chest CT with contrast 03/19/2022, chest CT with contrast 03/18/2021. FINDINGS: Cardiovascular: On the left, there is linear nonocclusive thrombus in the distal main pulmonary artery and straddling the first division of the left lower lobe artery with scattered downstream subsegmental small-vessel clots in the posterior basal distribution. There is a nonoccluding thrombus partially filling the left upper lobe anterior segmental artery with extension into the first order subsegmental arteries of the vessel. The left lung arteries are otherwise clear. On the right, there is a partially occluding thrombus in the interlobar artery to the middle lobe with nonocclusive thrombus in the medial and lateral segmental main arteries. There are scattered subsegmental arterial clots in the small vessels in the posterior basal right lower lobe distribution. No upper lobe emboli are seen. The heart has mildly enlarged since the prior studies and the pulmonary trunk and main arteries are increased in caliber now upper limit of normal. The RV/LV ratio is upper limits of normal at 0.9 but there is no IVC or hepatic vein reflux concerning for acute right heart strain. The pulmonary veins are decompressed. There is patchy moderate aortic calcific plaque, mild tortuosity in the descending segment, scattered calcifications in the great vessels but no aneurysm, stenosis or dissection. Stable three-vessel calcific CAD is again seen greatest in the LAD and right coronary arteries. There is no pericardial effusion. Mediastinum/Nodes: No interval change is seen in slightly prominent right hilar lymph nodes up to 1 cm in short axis, subcarinal nodes up to 1.1 cm in short axis, and right paratracheal lymph nodes up to 0.9 cm in short axis. There is no new or enlarging adenopathy. No thyroid or axillary mass. The thoracic trachea and thoracic esophagus are unremarkable. Lungs/Pleura: There is increased diffuse bronchial  thickening. Surgical changes of posterior right upper lobe wedge resection are again shown. The lungs are mildly emphysematous with centrilobular changes predominating. No pleural effusion or pneumothorax is seen. There is increased right lower lobe patchy consolidation, posteriorly in the superior segment and in the posterior basal right lower lobe, which is favored due to pneumonia or aspiration as there is fluid and debris in the distal right main and main lower lobe bronchi. Component of pulmonary infarction could certainly be present as well. There is increased scattered linear atelectasis in the bilateral lung bases but no other confluent consolidation. There is no pleural effusion or pneumothorax. Clustered micronodular disease is again noted posteriorly in the left upper lobe on 7: 41-45 most likely indicates chronic small airways disease. Remaining lungs are generally clear. Upper Abdomen: Stable appearance of multiple small left hepatic cysts. Slight capsular nodularity over portions of the left lobe is again noted in likely indicating early cirrhosis. No splenomegaly or ascites is seen and no portal vein dilatation. There are no acute upper abdominal findings. Chronic nodular thickening noted both adrenal glands, stable right renal cysts. Musculoskeletal: There are chronic mild upper plate anterior wedge deformities  of the T2 and T3 vertebral bodies. Osteopenia and mild thoracic spondylosis. No acute or significant osseous findings or destructive lesions. Review of the MIP images confirms the above findings. IMPRESSION: 1. Positive for bilateral pulmonary emboli with the clot burden greatest in the left upper lobe anterior segmental artery and in the interlobar artery to the middle lobe. Small overall clot burden. 2. Borderline RV/LV ratio but no IVC or hepatic vein reflux concerning for acute right heart strain. The pulmonary trunk and main arteries have become borderline for size. 3. Mild cardiomegaly.  4. Aortic and coronary artery atherosclerosis. 5. COPD with increased diffuse bronchial thickening. 6. Right lower lobe patchy consolidation favored due to pneumonia or aspiration with fluid and debris in the distal right main and main lower lobe bronchi. Component of pulmonary infarction could certainly be present as well. 7. Clustered micronodular disease posteriorly in the left upper lobe most likely due to chronic small airways disease. 8. Stable mildly prominent right hilar and mediastinal lymph nodes. 9. Stable left hepatic cysts and slight capsular nodularity over the left lobe of the liver which could be due to early cirrhosis. No splenomegaly or ascites. 10. Chronic nodular thickening of both adrenal glands. 11. Osteopenia and degenerative change. Chronic mild T2 and T3 compression fractures. Aortic Atherosclerosis (ICD10-I70.0). Electronically Signed   By: Telford Nab M.D.   On: 06/04/2022 00:08   DG Chest 1 View  Result Date: 06/03/2022 CLINICAL DATA:  Pain after fall EXAM: CHEST  1 VIEW COMPARISON:  08/12/2018 x-ray.  CT 03/19/2022 FINDINGS: There is some scarring atelectatic changes in the right mid to lower lung. Surgical changes noted on the prior CT scan. No consolidation, pneumothorax or effusion. No edema. Normal cardiopericardial silhouette. Overlapping cardiac leads IMPRESSION: No acute cardiopulmonary disease. Electronically Signed   By: Jill Side M.D.   On: 06/03/2022 12:06     LOS: 1 day   Oren Binet, MD  [facility]    To contact the attending provider between 7A-7P or the covering provider during after hours 7P-7A, please log into the web site [email or web address] and access using universal [place] password for that web site. If you do not have the password, please call the hospital operator.  06/05/2022, 11:12 AM

## 2022-06-05 NOTE — NC FL2 (Signed)
[place] LEVEL OF CARE FORM     IDENTIFICATION  Patient Name: Timothy Wood Birthdate: 16-Apr-1940 Sex: male Admission Date (Current Location): 06/03/2022  [facility] and [place] Number:  Herbalist and Address:  [facility], [place] [address] [place], [place] [address]      Provider Number: [id number]  Attending Physician Name and Address:  Jonetta Osgood, MD  Relative Name and Phone Number:       Current Level of Care: Hospital Recommended Level of Care: [place] Prior Approval Number:    Date Approved/Denied:   PASRR Number: [id number]  Discharge Plan: SNF    Current Diagnoses: Patient Active Problem List   Diagnosis Date Noted   Pulmonary embolism ([place]) 06/04/2022   Acute respiratory failure with hypoxia ([place]) 06/03/2022   Fall at home, initial encounter 06/03/2022   Leukocytosis 06/03/2022   Hypocalcemia 06/03/2022   History of DVT (deep vein thrombosis) 06/03/2022   Aortic atherosclerosis ([place]) 02/14/2021   Vitamin D deficiency, unspecified 04/18/2019   Personal history of colonic polyps 02/01/2019   Screening for viral disease 02/01/2019   Nightmare disorder 08/12/2018   Chronic anticoagulation 04/04/2018   Angiodysplasia of cecum 11/23/2017   Actinic keratoses 10/01/2017   Unstable gait 06/08/2016   Genetic testing 04/29/2016   Ganglion of right wrist 04/28/2016   Depression, major, in partial remission ([place]) 04/16/2016   Encounter for therapeutic drug monitoring 08/16/2015   Alcohol use    Hand laceration    Closed compression fracture of L1 vertebra (HCC)    Hx of pulmonary emboli ([place]) 08/05/2015   Lung cancer ([place]) 08/05/2015   Syncope 08/05/2015   CAD (coronary artery disease) 08/05/2015   Kidney disease 08/05/2015   Bone lesion 08/05/2015   Prostate hypertrophy 08/05/2015   Hx of pulmonary embolus [id number]   Complicated grieving 123456   Adenocarcinoma of right lung  ([place]) 01/24/2015   Coronary atherosclerosis due to calcified coronary lesion of native artery 01/14/2015   Tremor 09/19/2014   COPD GOLD I 03/03/2013   Grieving 02/06/2013   Abnormal prostate on physical examination 08/01/2012   Polyposis coli - attenuated 09/12/2010   Peripheral vascular disease ([place]) 06/11/2008   Hyperlipemia, mixed 03/02/2008   SEBORRHEIC KERATOSIS 03/02/2008   BACK PAIN, LUMBAR, CHRONIC 09/30/2007   TOBACCO ABUSE 08/19/2007   Essential hypertension 08/19/2007   Multiple pulmonary nodules 08/19/2007    Orientation RESPIRATION BLADDER Height & Weight     Self, Time, Situation, Place  Normal Continent Weight: 167 lb 8.8 oz (76 kg) Height:  5\' 8"  (172.7 cm)  BEHAVIORAL SYMPTOMS/MOOD NEUROLOGICAL BOWEL NUTRITION STATUS      Continent Diet (See dc summary)  AMBULATORY STATUS COMMUNICATION OF NEEDS Skin   Limited Assist Verbally Normal                       Personal Care Assistance Level of Assistance  Bathing, Feeding, Dressing Bathing Assistance: Limited assistance Feeding assistance: Independent Dressing Assistance: Limited assistance     Functional Limitations Info             SPECIAL CARE FACTORS FREQUENCY  PT (By licensed PT), OT (By licensed OT)     PT Frequency: 5x/week OT Frequency: 5x/week            Contractures Contractures Info: Not present    Additional Factors Info  Code Status, Allergies Code Status Info: Full Allergies Info: Codeine, Hydrocodone  Current Medications (06/05/2022):  This is the current hospital active medication list Current Facility-Administered Medications  Medication Dose Route Frequency Provider Last Rate Last Admin   acetaminophen (TYLENOL) tablet 1,000 mg  1,000 mg Oral Q8H Ghimire, Shanker M, MD   1,000 mg at 06/05/22 1300   albuterol (PROVENTIL) (2.5 MG/3ML) 0.083% nebulizer solution 2.5 mg  2.5 mg Nebulization Q6H PRN Fuller Plan A, MD       amLODipine (NORVASC) tablet 5 mg  5 mg  Oral Daily Tamala Julian, Rondell A, MD   5 mg at 06/05/22 1024   amoxicillin-clavulanate (AUGMENTIN) 875-125 MG per tablet 1 tablet  1 tablet Oral Q12H Ghimire, Henreitta Leber, MD       apixaban Arne Cleveland) tablet 10 mg  10 mg Oral BID Jonetta Osgood, MD   10 mg at 06/05/22 1025   Followed by   Derrill Memo ON 06/12/2022] apixaban (ELIQUIS) tablet 5 mg  5 mg Oral BID Ghimire, Henreitta Leber, MD       carvedilol (COREG) tablet 6.25 mg  6.25 mg Oral BID Tamala Julian, Rondell A, MD   6.25 mg at 06/05/22 1025   DULoxetine (CYMBALTA) DR capsule 30 mg  30 mg Oral Daily Tamala Julian, Rondell A, MD   30 mg at 06/05/22 1024   fentaNYL (SUBLIMAZE) injection 25-50 mcg  25-50 mcg Intravenous Q6H PRN Jonetta Osgood, MD   50 mcg at [id number]   folic acid (FOLVITE) tablet 1 mg  1 mg Oral Daily Fuller Plan A, MD   1 mg at 06/05/22 1024   hydrALAZINE (APRESOLINE) injection 10 mg  10 mg Intravenous Q4H PRN Howerter, Justin B, DO       irbesartan (AVAPRO) tablet 150 mg  150 mg Oral Daily Smith, Rondell A, MD   150 mg at 06/05/22 1024   LORazepam (ATIVAN) tablet 1-4 mg  1-4 mg Oral Q1H PRN Fuller Plan A, MD       Or   LORazepam (ATIVAN) injection 1-4 mg  1-4 mg Intravenous Q1H PRN Fuller Plan A, MD   2 mg at 06/03/22 1643   LORazepam (ATIVAN) tablet 0-4 mg  0-4 mg Oral Q6H Smith, Rondell A, MD   2 mg at 06/03/22 1525   Followed by   LORazepam (ATIVAN) tablet 0-4 mg  0-4 mg Oral Q12H Smith, Rondell A, MD       methocarbamol (ROBAXIN) tablet 500 mg  500 mg Oral Q8H PRN Fuller Plan A, MD   500 mg at 06/05/22 0117   multivitamin with minerals tablet 1 tablet  1 tablet Oral Daily Fuller Plan A, MD   1 tablet at 06/05/22 1024   nicotine (NICODERM CQ - dosed in mg/24 hours) patch 21 mg  21 mg Transdermal Daily Howerter, Justin B, DO   21 mg at 06/05/22 1024   ondansetron (ZOFRAN) tablet 4 mg  4 mg Oral Q6H PRN Fuller Plan A, MD       Or   ondansetron (ZOFRAN) injection 4 mg  4 mg Intravenous Q6H PRN Smith, Rondell A, MD        oxyCODONE (Oxy IR/ROXICODONE) immediate release tablet 5 mg  5 mg Oral Q4H PRN Fuller Plan A, MD   5 mg at 06/05/22 0117   pantoprazole (PROTONIX) EC tablet 40 mg  40 mg Oral Daily Smith, Rondell A, MD   40 mg at 06/05/22 1024   rosuvastatin (CRESTOR) tablet 20 mg  20 mg Oral Daily Fuller Plan A, MD   20 mg at 06/05/22  1024   sodium chloride flush (NS) 0.9 % injection 3 mL  3 mL Intravenous Q12H Smith, Rondell A, MD   3 mL at 06/05/22 1025   thiamine (VITAMIN B1) tablet 100 mg  100 mg Oral Daily Tamala Julian, Rondell A, MD   100 mg at 06/05/22 1024   Or   thiamine (VITAMIN B1) injection 100 mg  100 mg Intravenous Daily Norval Morton, MD         Discharge Medications: Please see discharge summary for a list of discharge medications.  Relevant Imaging Results:  Relevant Lab Results:   Additional Information [phone] [phone].  Benard Halsted, LCSW

## 2022-06-05 NOTE — Discharge Instructions (Signed)
Information on my medicine - ELIQUIS (apixaban)  Why was Eliquis prescribed for you? Eliquis was prescribed to treat blood clots that may have been found in the veins of your legs (deep vein thrombosis) or in your lungs (pulmonary embolism) and to reduce the risk of them occurring again.  What do You need to know about Eliquis ? The starting dose is 10 mg (two 5 mg tablets) taken TWICE daily for the FIRST SEVEN (7) DAYS, then on  06/12/22  the dose is reduced to ONE 5 mg tablet taken TWICE daily.  Eliquis may be taken with or without food.   Try to take the dose about the same time in the morning and in the evening. If you have difficulty swallowing the tablet whole please discuss with your pharmacist how to take the medication safely.  Take Eliquis exactly as prescribed and DO NOT stop taking Eliquis without talking to the doctor who prescribed the medication.  Stopping may increase your risk of developing a new blood clot.  Refill your prescription before you run out.  After discharge, you should have regular check-up appointments with your healthcare provider that is prescribing your Eliquis.    What do you do if you miss a dose? If a dose of ELIQUIS is not taken at the scheduled time, take it as soon as possible on the same day and twice-daily administration should be resumed. The dose should not be doubled to make up for a missed dose.  Important Safety Information A possible side effect of Eliquis is bleeding. You should call your healthcare provider right away if you experience any of the following: Bleeding from an injury or your nose that does not stop. Unusual colored urine (red or dark brown) or unusual colored stools (red or black). Unusual bruising for unknown reasons. A serious fall or if you hit your head (even if there is no bleeding).  Some medicines may interact with Eliquis and might increase your risk of bleeding or clotting while on Eliquis. To help avoid this,  consult your healthcare provider or pharmacist prior to using any new prescription or non-prescription medications, including herbals, vitamins, non-steroidal anti-inflammatory drugs (NSAIDs) and supplements.  This website has more information on Eliquis (apixaban): http://www.eliquis.com/eliquis/home   Do not drive, operate heavy machinery, perform activities at heights, swimming or participation in water activities or provide baby sitting services until you have seen by Primary MD or a Neurologist and advised to do so again.  Follow with Primary MD [name], [name], MD in 7 days   Get CBC, CMP, 2 view Chest X ray -  checked next visit with your SNF MD   Activity: As tolerated with Full fall precautions use walker/cane & assistance as needed, wear the TLSO brace whenever out of the bed.  Disposition SNF  Diet: Heart Healthy   Special Instructions: If you have smoked or chewed Tobacco  in the last 2 yrs please stop smoking, stop any regular Alcohol  and or any Recreational drug use.  On your next visit with your primary care physician please Get Medicines reviewed and adjusted.  Please request your Prim.MD to go over all Hospital Tests and Procedure/Radiological results at the follow up, please get all Hospital records sent to your Prim MD by signing hospital release before you go home.  If you experience worsening of your admission symptoms, develop shortness of breath, life threatening emergency, suicidal or homicidal thoughts you must seek medical attention immediately by calling 911 or calling your  MD immediately  if symptoms less severe.  You Must read complete instructions/literature along with all the possible adverse reactions/side effects for all the Medicines you take and that have been prescribed to you. Take any new Medicines after you have completely understood and accpet all the possible adverse reactions/side effects.

## 2022-06-05 NOTE — TOC Benefit Eligibility Note (Signed)
Patient Teacher, English as a foreign language completed.    The patient is currently admitted and upon discharge could be taking Eliquis 5 mg.  The current 30 day co-pay is $47.00.   The patient is currently admitted and upon discharge could be taking Xarelto 20 mg.  The current 30 day co-pay is $47.00.   The patient is insured through [employer] Part D   This test claim was processed through [facility] amounts may vary at other pharmacies due to pharmacy/plan contracts, or as the patient moves through the different stages of their insurance plan.  Lyndel Safe, [place] Patient Advocate Specialist [place] Patient Advocate Team Direct Number: [phone]  Fax: [phone]

## 2022-06-05 NOTE — Evaluation (Signed)
Clinical/Bedside Swallow Evaluation Patient Details  Name: Timothy Wood MRN: [id number] Date of Birth: 1940/03/24  Today's Date: 06/05/2022 Time: SLP Start Time (ACUTE ONLY): [id number] SLP Stop Time (ACUTE ONLY): 0910 SLP Time Calculation (min) (ACUTE ONLY): 12 min  Past Medical History:  Past Medical History:  Diagnosis Date   Adenocarcinoma of lung, stage 1 ([place])    Anemia    Angiodysplasia of cecum 11/23/2017   Multiple 1-2 mm   Arthritis    back possibly   Blood clotting disorder (HCC)    Cataract    removed bilaterally    Chronic obstructive pulmonary disease (COPD) ([place])    "patient not aware"   Clotting disorder ([place])    PE LUNG, dvt leg    Colon polyps    Depression    Diverticulosis    DVT (deep venous thrombosis) ([place])    Post op  knee surgery   DVT (deep venous thrombosis) (HCC)    GERD (gastroesophageal reflux disease)    Histoplasmosis    Histoplasmosis with pneumonia ([place]) 04/01/2015   Hypercholesteremia    Hyperlipidemia    Hypertension    Laceration of head 08/05/2015   Lumbar compression fracture ([place])    Lung cancer ([place]) dx'd 2016   Personal history of colonic polyps 2008   multiple adenomas 2008-2009 exams with large cecal adenoma   Pneumonia    in history   Polyposis coli - attenuated 09/12/2010   2008: adenomas with 9 polyps index and 2.5 cm cecal polyp removed over 3 exams, last 2009 with APC. diverticulosis also. 11/24/2010: 12 polyps removed - adenomas Anticipate routine repeat colonoscopy Spring 2013 Have recommended he see genetics counselor re ? Polyposis 03/27/2016 14 polyps removed max 15 mm    Pulmonary nodule 06/2006   9 mm right upper obe pulmonary nodule (negative Pet Scan)   PVD (peripheral vascular disease) (HCC)    left leg s/p stent   Sebaceous cyst    neck   Seborrheic keratosis    Seizures ([place])    4-5 yrs ago had seizures when in ICU- none since this time    Sinusitis    Tobacco abuse    Tremor    Past Surgical History:  Past  Surgical History:  Procedure Laterality Date   ANGIOPLASTY / STENTING ILIAC  08/2008   Dr. Gray Bernhardt SURGERY  1975, 1978   x2 lumbar fusion over 30 years ago   BACK SURGERY     COLONOSCOPY  05/11/2006; 07/27/2006;05/05/2007, 11/24/2010   adenomas with 9 polyps index and 2.5 cm cecal polyp removed over 3 exams, last 2009 with APC. diverticulosis also.  2012: 12 adenomas removed, largest 8-10 mm, diverticulosis   CORONARY ANGIOPLASTY WITH STENT PLACEMENT     DESCENDING AORTIC ANEURYSM REPAIR W/ STENT  04/2010   Dr. Trula Slade   ESOPHAGOGASTRODUODENOSCOPY  05/05/2007   GERD - one erosion   EYE SURGERY Bilateral    cataract   KNEE SURGERY  2002   left- cartilage    LUNG BIOPSY     PERCUTANEOUS PINNING Left 06/24/2012   Procedure: Repair Complex Laceration Left Thumb with percutaneous pinning.;  Surgeon: Dennie Bible, MD;  Location: [facility];  Service: Plastics;  Laterality: Left;   THUMB AMPUTATION Left    pinning and index finger   TONSILLECTOMY  1956   TONSILLECTOMY     UPPER GASTROINTESTINAL ENDOSCOPY     VIDEO ASSISTED THORACOSCOPY (VATS)/WEDGE RESECTION Right 01/24/2015   Procedure: RIGHT VIDEO ASSISTED THORACOSCOPY (VATS)/WEDGE  RESECTION;  Surgeon: [name], MD;  Location: [facility];  Service: Thoracic;  Laterality: Right;   vocal cord polyps removed     HPI:  Pt is a 82 y.o. male who presented with a mechanical fall-he was found to have L1 compression fracture. He was also found to be hypoxemic upon further evaluation. Pt with history of VTE, adenocarcinoma of the lung-s/p VATS/wedge resection of right middle/upper lobe-2016, GERD, and tobacco abuse. CXR, 06/03/22. "No acute cardiopulmonary disease." CT angio "1. Positive for bilateral pulmonary emboli with the clot burden  greatest in the left upper lobe anterior segmental artery and in the  interlobar artery to the middle lobe. Small overall clot burden.  2. Borderline RV/LV ratio but no IVC or hepatic vein reflux  concerning for  acute right heart strain. The pulmonary trunk and  main arteries have become borderline for size.  3. Mild cardiomegaly.  4. Aortic and coronary artery atherosclerosis.  5. COPD with increased diffuse bronchial thickening.  6. Right lower lobe patchy consolidation favored due to pneumonia or  aspiration with fluid and debris in the distal right main and main  lower lobe bronchi. Component of pulmonary infarction could  certainly be present as well.  7. Clustered micronodular disease posteriorly in the left upper lobe  most likely due to chronic small airways disease.  8. Stable mildly prominent right hilar and mediastinal lymph nodes.  9. Stable left hepatic cysts and slight capsular nodularity over the  left lobe of the liver which could be due to early cirrhosis. No  splenomegaly or ascites.  10. Chronic nodular thickening of both adrenal glands.  11. Osteopenia and degenerative change. Chronic mild T2 and T3  compression fractures."    Assessment / Plan / Recommendation  Clinical Impression  Pt seen for clinical swallowing evaluation. Pt demonstrated an intact oral swallow. Pharyngeal swallow appeared [facility] per clinical assessment. No f/u SLP services warranted at this time.  SLP Visit Diagnosis: Dysphagia, unspecified (R13.10)    Aspiration Risk  Mild aspiration risk    Diet Recommendation Regular;Thin liquid   Liquid Administration via: Spoon;Cup;Straw Medication Administration: Whole meds with liquid Supervision: Patient able to self feed Compensations: Slow rate;Small sips/bites Postural Changes: Seated upright at 90 degrees;Remain upright for at least 30 minutes after po intake (upright as possible for POs)    Other  Recommendations Oral Care Recommendations: Oral care QID;Patient independent with oral care (set up pt for oral care/denture care)    Recommendations for follow up therapy are one component of a multi-disciplinary discharge planning process, led by the attending physician.   Recommendations may be updated based on patient status, additional functional criteria and insurance authorization.  Follow up Recommendations No SLP follow up         Functional Status Assessment Patient has not had a recent decline in their functional status         Prognosis Prognosis for improved oropharyngeal function: Good      Swallow Study   General Date of Onset: 06/03/22 HPI: Pt is a 82 y.o. male who presented with a mechanical fall-he was found to have L1 compression fracture. He was also found to be hypoxemic upon further evaluation. Pt with history of VTE, adenocarcinoma of the lung-s/p VATS/wedge resection of right middle/upper lobe-2016, GERD, and tobacco abuse. CXR, 06/03/22. "No acute cardiopulmonary disease." CT angio "1. Positive for bilateral pulmonary emboli with the clot burden  greatest in the left upper lobe anterior segmental artery and in the  interlobar artery to the middle lobe. Small overall clot burden.  2. Borderline RV/LV ratio but no IVC or hepatic vein reflux  concerning for acute right heart strain. The pulmonary trunk and  main arteries have become borderline for size.  3. Mild cardiomegaly.  4. Aortic and coronary artery atherosclerosis.  5. COPD with increased diffuse bronchial thickening.  6. Right lower lobe patchy consolidation favored due to pneumonia or  aspiration with fluid and debris in the distal right main and main  lower lobe bronchi. Component of pulmonary infarction could  certainly be present as well.  7. Clustered micronodular disease posteriorly in the left upper lobe  most likely due to chronic small airways disease.  8. Stable mildly prominent right hilar and mediastinal lymph nodes.  9. Stable left hepatic cysts and slight capsular nodularity over the  left lobe of the liver which could be due to early cirrhosis. No  splenomegaly or ascites.  10. Chronic nodular thickening of both adrenal glands.  11. Osteopenia and degenerative change. Chronic  mild T2 and T3  compression fractures." Type of Study: Bedside Swallow Evaluation Previous Swallow Assessment: none Diet Prior to this Study: Regular;Thin liquids (Level 0) Temperature Spikes Noted: Yes Respiratory Status: Nasal cannula (6L/min via HHFNC) History of Recent Intubation: No Behavior/Cognition: Alert;Cooperative;Pleasant mood Oral Cavity Assessment: Within Functional Limits Oral Care Completed by SLP: No Oral Cavity - Dentition: Dentures, top;Dentures, bottom Vision: Functional for self-feeding Self-Feeding Abilities: Able to feed self Patient Positioning: Partially reclined Baseline Vocal Quality: Normal Volitional Cough: Strong Volitional Swallow: Able to elicit    Oral/Motor/Sensory Function Overall Oral Motor/Sensory Function: Within functional limits   Ice Chips Ice chips: Not tested   Thin Liquid Thin Liquid: Within functional limits Presentation: Self Fed;Cup;Straw    Nectar Thick Nectar Thick Liquid: Not tested   Honey Thick Honey Thick Liquid: Not tested   Puree Puree: Within functional limits Presentation: Self Fed;Spoon   Solid     Solid: Within functional limits Presentation: Self Fed     Cherrie Gauze, M.S., [place] Pathologist Secure Chat Preferred  O: [phone]  Clearnce Sorrel Philopater Mucha 06/05/2022,9:44 AM

## 2022-06-06 DIAGNOSIS — J9601 Acute respiratory failure with hypoxia: Secondary | ICD-10-CM | POA: Diagnosis not present

## 2022-06-06 LAB — CBC WITH DIFFERENTIAL/PLATELET
Abs Immature Granulocytes: 0.03 10*3/uL (ref 0.00–0.07)
Basophils Absolute: 0 10*3/uL (ref 0.0–0.1)
Basophils Relative: 0 %
Eosinophils Absolute: 0.2 10*3/uL (ref 0.0–0.5)
Eosinophils Relative: 3 %
HCT: 40.6 % (ref 39.0–52.0)
Hemoglobin: 13.7 g/dL (ref 13.0–17.0)
Immature Granulocytes: 1 %
Lymphocytes Relative: 15 %
Lymphs Abs: 0.9 10*3/uL (ref 0.7–4.0)
MCH: 31.5 pg (ref 26.0–34.0)
MCHC: 33.7 g/dL (ref 30.0–36.0)
MCV: 93.3 fL (ref 80.0–100.0)
Monocytes Absolute: 0.7 10*3/uL (ref 0.1–1.0)
Monocytes Relative: 11 %
Neutro Abs: 4.3 10*3/uL (ref 1.7–7.7)
Neutrophils Relative %: 70 %
Platelets: 140 10*3/uL — ABNORMAL LOW (ref 150–400)
RBC: 4.35 MIL/uL (ref 4.22–5.81)
RDW: 12.7 % (ref 11.5–15.5)
WBC: 6.1 10*3/uL (ref 4.0–10.5)
nRBC: 0 % (ref 0.0–0.2)

## 2022-06-06 LAB — BASIC METABOLIC PANEL
Anion gap: 6 (ref 5–15)
BUN: 7 mg/dL — ABNORMAL LOW (ref 8–23)
CO2: 25 mmol/L (ref 22–32)
Calcium: 8.2 mg/dL — ABNORMAL LOW (ref 8.9–10.3)
Chloride: 103 mmol/L (ref 98–111)
Creatinine, Ser: 0.79 mg/dL (ref 0.61–1.24)
GFR, Estimated: >60 mL/min (ref >60)
Glucose, Bld: 142 mg/dL — ABNORMAL HIGH (ref 70–99)
Potassium: 3.4 mmol/L — ABNORMAL LOW (ref 3.5–5.1)
Sodium: 134 mmol/L — ABNORMAL LOW (ref 135–145)

## 2022-06-06 LAB — MAGNESIUM: Magnesium: 2 mg/dL (ref 1.7–2.4)

## 2022-06-06 LAB — BRAIN NATRIURETIC PEPTIDE: B Natriuretic Peptide: 62.1 pg/mL (ref 0.0–100.0)

## 2022-06-06 MED ORDER — HYDRALAZINE HCL 50 MG PO TABS
50.0000 mg | ORAL_TABLET | Freq: Three times a day (TID) | ORAL | Status: DC
  Administered 2022-06-06 – 2022-06-08 (×7): 50 mg via ORAL
  Filled 2022-06-06 (×7): qty 1

## 2022-06-06 MED ORDER — POTASSIUM CHLORIDE CRYS ER 20 MEQ PO TBCR
40.0000 meq | EXTENDED_RELEASE_TABLET | Freq: Once | ORAL | Status: AC
  Administered 2022-06-06: 40 meq via ORAL
  Filled 2022-06-06: qty 2

## 2022-06-06 NOTE — Plan of Care (Signed)

## 2022-06-06 NOTE — Progress Notes (Addendum)
PROGRESS NOTE        PATIENT DETAILS Name: Timothy Wood Age: 82 y.o. Sex: male Date of Birth: 01-Mar-1941 Admit Date: 06/03/2022 Admitting Physician Evalee Mutton Kristeen Mans, MD [id number], Malka So, MD  Brief Summary: Patient is a 82 y.o.  male history of VTE, adenocarcinoma of the lung-s/p VATS/wedge resection of right middle/upper lobe-2016, tobacco abuse-who presented with a mechanical fall-he was found to have L1 compression fracture.  He was also found to be hypoxemic-upon further evaluation-he was found to have PE on CT angiogram chest.  Significant events: 3/20>> admit to [facility].  Significant studies: 3/20>> x-ray lumbar/thoracic spine: Age indeterminate L1 superior endplate compression fracture, chronic T3 superior endplate vertebral body height loss, chronic stable L2-L3 superior endplate compression fracture. 3/20>> CTA chest:+ve PE greatest clot burden in the left upper lobe.  Right lower lobe patchy consolidation  3/21>> echo: EF [id number], RV systolic function normal. 3/22>> bilateral lower extremity Doppler:acute, occlusive DVT in the left distal femoral vein, popliteal vein, and extending into the gastroc and posterior tibial veins (verbal per technician)  Significant microbiology data: 3/21>> blood culture: neg  Procedures: None  Consults: None  Subjective:  Patient in bed, appears comfortable, denies any headache, no fever, no chest pain or pressure, no shortness of breath , no abdominal pain. No new focal weakness.   Objective: Vitals: Blood pressure (!) 161/84, pulse 68, temperature 97.7 F (36.5 C), temperature source Oral, resp. rate 14, height 5\' 8"  (1.727 m), weight 76 kg, SpO2 95 %.   Exam:  Awake Alert, No new F.N deficits, Normal affect [place].AT,PERRAL Supple Neck, No JVD,   Symmetrical Chest wall movement, Good air movement bilaterally, CTAB RRR,No Gallops, Rubs or new Murmurs,  +ve B.Sounds, Abd Soft, No tenderness,   No  Cyanosis, Clubbing or edema    Assessment/Plan: Mechanical fall Presumed acute L1 compression fracture Numerous chronic healed fractures Neurosurgery recommending TLSO brace when OOB, no lifting> 10-15 pounds, follow-up with Dr. Marcello Moores in 2-4 weeks Mobilize with PT/OT Continue scheduled Tylenol As needed oxycodone for moderate pain, and as needed fentanyl for severe pain SNF planned n discharge  Acute hypoxic respiratory failure  Recurrent pulmonary embolism LLE DVT on Doppler 3/22 Aspiration pneumonia Prior history of PE 2017, remote history of DVT This current episode of VTE is due to noncompliance-does not appear that he has been taking Xarelto since August 2023-confirmed by calling his pharmacy on 3/22 Echo stable without RV strain Doppler with LLE DVT (prelim result per technician) Stop IV heparin-transition to Eliquis.Needs indefinite anticoagulation given recurrent VTE Stop IV Rocephin/Zithromax-transition to Augmentin-x 5 days total. Continue attempts to titrate down FiO2   HTN BP stable Continue amlodipine/Avapro and Coreg Follow/optimize  PAD-s/p iliac stenting 2010 AAA s/p repair 2012 Continue statin Suspect not on antiplatelets as on anticoagulation  COPD Continue bronchodilators Not in exacerbation  History of lung CA-s/p wedge resection 2016 Follows with Dr. Julien Nordmann Currently on observation  Tobacco abuse Counseled  EtOH use Reported drinking a half a pint of brandy before he fell Denies EtOH use on a daily basis Monitor closely on Ativan per CIWA protocol Currently no signs of withdrawals  Hypokalemia.  Replaced.    BMI: Estimated body mass index is 25.48 kg/m as calculated from the following:   Height as of this encounter: 5\' 8"  (1.727 m).   Weight as of this encounter: 81  kg.   Code status:   Code Status: Full Code   DVT Prophylaxis:IV heparin   Family Communication: [id number] 3/22   Disposition  Plan: Status is: Observation The patient will require care spanning > 2 midnights and should be moved to inpatient because: Severity of illness   Planned Discharge Destination:SNF   Diet: Diet Order             Diet Heart Room service appropriate? Yes; Fluid consistency: Thin  Diet effective now                   MEDICATIONS: Scheduled Meds:  acetaminophen  1,000 mg Oral Q8H   amLODipine  5 mg Oral Daily   amoxicillin-clavulanate  1 tablet Oral Q12H   apixaban  10 mg Oral BID   Followed by   Derrill Memo ON 06/12/2022] apixaban  5 mg Oral BID   carvedilol  6.25 mg Oral BID   DULoxetine  30 mg Oral Daily   folic acid  1 mg Oral Daily   hydrALAZINE  50 mg Oral Q8H   irbesartan  150 mg Oral Daily   LORazepam  0-4 mg Oral Q12H   multivitamin with minerals  1 tablet Oral Daily   nicotine  21 mg Transdermal Daily   pantoprazole  40 mg Oral Daily   rosuvastatin  20 mg Oral Daily   sodium chloride flush  3 mL Intravenous Q12H   thiamine  100 mg Oral Daily   Or   thiamine  100 mg Intravenous Daily   Continuous Infusions:   PRN Meds:.albuterol, fentaNYL (SUBLIMAZE) injection, hydrALAZINE, LORazepam **OR** LORazepam, methocarbamol, ondansetron **OR** ondansetron (ZOFRAN) IV, oxyCODONE   I have personally reviewed following labs and imaging studies  LABORATORY DATA:  Recent Labs  Lab 06/03/22 0825 06/04/22 0501 06/05/22 0615 06/06/22 0300  WBC 12.7* 14.1* 8.1 6.1  HGB 16.2 15.0 14.4 13.7  HCT 49.8 45.0 43.6 40.6  PLT 221 170 149* 140*  MCV 95.8 93.4 94.2 93.3  MCH 31.2 31.1 31.1 31.5  MCHC 32.5 33.3 33.0 33.7  RDW 13.4 13.1 13.1 12.7  LYMPHSABS 1.0  --   --  0.9  MONOABS 0.7  --   --  0.7  EOSABS 0.1  --   --  0.2  BASOSABS 0.0  --   --  0.0    Recent Labs  Lab 06/03/22 0825 06/04/22 0501 06/04/22 0808 06/06/22 0300  NA 141 135  --  134*  K 3.5 3.6  --  3.4*  CL 102 102  --  103  CO2 25 24  --  25  ANIONGAP 14 9  --  6  GLUCOSE 102* 96  --  142*  BUN  11 8  --  7*  CREATININE 0.94 0.83  --  0.79  AST 19  --   --   --   ALT 17  --   --   --   ALKPHOS 104  --   --   --   BILITOT 0.7  --   --   --   ALBUMIN 3.8  --   --   --   PROCALCITON  --  <0.10  --   --   LATICACIDVEN  --  1.2 1.1  --   BNP  --   --   --  62.1  MG  --  1.3*  --  2.0  CALCIUM 8.0* 8.1*  --  8.2*    RADIOLOGY STUDIES/RESULTS: VAS [place] LOWER  EXTREMITY VENOUS (DVT)  Result Date: 06/05/2022  Lower Venous DVT Study Patient Name:  Timothy Wood [facility]  Date of Exam:   06/05/2022 Medical Rec #: [id number]         Accession #:    [id number] Date of Birth: [birth year]        Patient Gender: M Patient Age:   61 years Exam Location:  [facility] Procedure:      VAS [place] LOWER EXTREMITY VENOUS (DVT) Referring Phys: Oren Binet --------------------------------------------------------------------------------  Indications: Pulmonary embolism. Other Indications: Patient endorses history of left lower extremity DVT, as well                    as new incidence of left posterior knee and calf pain x3                    weeks. Comparison Study: 06-03-2022 CTA positive for pulmonary embolism Performing Technologist: Darlin Coco RDMS, RVT  Examination Guidelines: A complete evaluation includes B-mode imaging, spectral Doppler, color Doppler, and power Doppler as needed of all accessible portions of each vessel. Bilateral testing is considered an integral part of a complete examination. Limited examinations for reoccurring indications may be performed as noted. The reflux portion of the exam is performed with the patient in reverse Trendelenburg.  +---------+---------------+---------+-----------+----------+--------------+ RIGHT    CompressibilityPhasicitySpontaneityPropertiesThrombus Aging +---------+---------------+---------+-----------+----------+--------------+ CFV      Full           Yes      Yes                                  +---------+---------------+---------+-----------+----------+--------------+ SFJ      Full                                                        +---------+---------------+---------+-----------+----------+--------------+ FV Prox  Full                                                        +---------+---------------+---------+-----------+----------+--------------+ FV Mid   Full                                                        +---------+---------------+---------+-----------+----------+--------------+ FV DistalFull                                                        +---------+---------------+---------+-----------+----------+--------------+ PFV      Full                                                        +---------+---------------+---------+-----------+----------+--------------+ POP  Full           Yes      Yes                                 +---------+---------------+---------+-----------+----------+--------------+ PTV      Full                                                        +---------+---------------+---------+-----------+----------+--------------+ PERO     Full                                                        +---------+---------------+---------+-----------+----------+--------------+   +---------+---------------+---------+-----------+----------+--------------+ LEFT     CompressibilityPhasicitySpontaneityPropertiesThrombus Aging +---------+---------------+---------+-----------+----------+--------------+ CFV      Full           Yes      Yes                                 +---------+---------------+---------+-----------+----------+--------------+ SFJ      Full                                                        +---------+---------------+---------+-----------+----------+--------------+ FV Prox  Full                                                         +---------+---------------+---------+-----------+----------+--------------+ FV Mid   Full                                                        +---------+---------------+---------+-----------+----------+--------------+ FV DistalNone           No       No                   Acute          +---------+---------------+---------+-----------+----------+--------------+ PFV      Full                                                        +---------+---------------+---------+-----------+----------+--------------+ POP      None           No       No                   Acute          +---------+---------------+---------+-----------+----------+--------------+ PTV  Partial        Yes      Yes                  Acute          +---------+---------------+---------+-----------+----------+--------------+ PERO     Full                                                        +---------+---------------+---------+-----------+----------+--------------+ Gastroc  None           No       No                   Acute          +---------+---------------+---------+-----------+----------+--------------+     Summary: RIGHT: - There is no evidence of deep vein thrombosis in the lower extremity.  - No cystic structure found in the popliteal fossa.  LEFT: - Findings consistent with acute deep vein thrombosis involving the left distal femoral vein, left popliteal vein, left posterior tibial veins, and left gastrocnemius veins.  - No cystic structure found in the popliteal fossa.  *See table(s) above for measurements and observations.    Preliminary    ECHOCARDIOGRAM COMPLETE  Result Date: 06/04/2022    ECHOCARDIOGRAM REPORT   Patient Name:   Timothy Wood [facility] Date of Exam: 06/04/2022 Medical Rec #:  [id number]        Height:       68.0 in Accession #:    [id number]       Weight:       167.5 lb Date of Birth:  1940-06-23       BSA:          1.896 m Patient Age:    36 years         BP:           159/66  mmHg Patient Gender: M                HR:           87 bpm. Exam Location:  Inpatient Procedure: 2D Echo, Color Doppler and Cardiac Doppler Indications:    Pulmonary Embolus  History:        Patient has prior history of Echocardiogram examinations, most                 recent 04/05/2020. CAD, COPD, Signs/Symptoms:Syncope; Risk                 Factors:Hypertension, Dyslipidemia and Former Smoker. DVT, Lung                 Cancer.  Sonographer:    Rolla Etienne Referring Phys: [id number] Rhetta Mura  Sonographer Comments: Image acquisition challenging due to COPD. IMPRESSIONS  1. Left ventricular ejection fraction, by estimation, is 60 to 65%. The left ventricle has normal function. The left ventricle has no regional wall motion abnormalities. Left ventricular diastolic parameters are consistent with Grade I diastolic dysfunction (impaired relaxation).  2. Right ventricular systolic function is normal. The right ventricular size is normal.  3. The mitral valve is normal in structure. No evidence of mitral valve regurgitation. No evidence of mitral stenosis.  4. The aortic valve is normal in structure. Aortic valve regurgitation is not visualized. Aortic valve sclerosis is present, with  no evidence of aortic valve stenosis.  5. The inferior vena cava is normal in size with greater than 50% respiratory variability, suggesting right atrial pressure of 3 mmHg. FINDINGS  Left Ventricle: Left ventricular ejection fraction, by estimation, is 60 to 65%. The left ventricle has normal function. The left ventricle has no regional wall motion abnormalities. The left ventricular internal cavity size was normal in size. There is  no left ventricular hypertrophy. Left ventricular diastolic parameters are consistent with Grade I diastolic dysfunction (impaired relaxation). Right Ventricle: The right ventricular size is normal. No increase in right ventricular wall thickness. Right ventricular systolic function is normal. Left  Atrium: Left atrial size was normal in size. Right Atrium: Right atrial size was normal in size. Pericardium: There is no evidence of pericardial effusion. Presence of epicardial fat layer. Mitral Valve: The mitral valve is normal in structure. No evidence of mitral valve regurgitation. No evidence of mitral valve stenosis. Tricuspid Valve: The tricuspid valve is normal in structure. Tricuspid valve regurgitation is not demonstrated. No evidence of tricuspid stenosis. Aortic Valve: The aortic valve is normal in structure. Aortic valve regurgitation is not visualized. Aortic valve sclerosis is present, with no evidence of aortic valve stenosis. Aortic valve mean gradient measures 3.0 mmHg. Aortic valve peak gradient measures 6.5 mmHg. Aortic valve area, by VTI measures 3.17 cm. Pulmonic Valve: The pulmonic valve was normal in structure. Pulmonic valve regurgitation is not visualized. No evidence of pulmonic stenosis. Aorta: The aortic root is normal in size and structure. Venous: The inferior vena cava is normal in size with greater than 50% respiratory variability, suggesting right atrial pressure of 3 mmHg. IAS/Shunts: No atrial level shunt detected by color flow Doppler.  LEFT VENTRICLE PLAX 2D LVIDd:         4.40 cm     Diastology LVIDs:         2.70 cm     LV e' medial:    7.18 cm/s LV PW:         1.10 cm     LV E/e' medial:  8.0 LV IVS:        1.10 cm     LV e' lateral:   5.98 cm/s LVOT diam:     2.20 cm     LV E/e' lateral: 9.5 LV SV:         67 LV SV Index:   35 LVOT Area:     3.80 cm  LV Volumes (MOD) LV vol d, MOD A2C: 33.7 ml LV vol d, MOD A4C: 58.8 ml LV vol s, MOD A2C: 14.1 ml LV vol s, MOD A4C: 20.3 ml LV SV MOD A2C:     19.6 ml LV SV MOD A4C:     58.8 ml LV SV MOD BP:      28.1 ml RIGHT VENTRICLE RV Basal diam:  2.70 cm RV Mid diam:    2.50 cm RV S prime:     19.10 cm/s TAPSE (M-mode): 1.9 cm LEFT ATRIUM             Index        RIGHT ATRIUM           Index LA diam:        3.30 cm 1.74 cm/m   RA  Area:     11.40 cm LA Vol (A2C):   20.4 ml 10.76 ml/m  RA Volume:   21.50 ml  11.34 ml/m LA Vol (A4C):   25.8 ml 13.61 ml/m  LA Biplane Vol: 23.0 ml 12.13 ml/m  AORTIC VALVE AV Area (Vmax):    2.89 cm AV Area (Vmean):   2.82 cm AV Area (VTI):     3.17 cm AV Vmax:           127.00 cm/s AV Vmean:          86.000 cm/s AV VTI:            0.211 m AV Peak Grad:      6.5 mmHg AV Mean Grad:      3.0 mmHg LVOT Vmax:         96.40 cm/s LVOT Vmean:        63.900 cm/s LVOT VTI:          0.176 m LVOT/AV VTI ratio: 0.83 MITRAL VALVE               TRICUSPID VALVE MV Area (PHT): 3.60 cm    TR Peak grad:   23.2 mmHg MV Decel Time: 211 msec    TR Vmax:        241.00 cm/s MV E velocity: 57.10 cm/s MV A velocity: 83.30 cm/s  SHUNTS MV E/A ratio:  0.69        Systemic VTI:  0.18 m                            Systemic Diam: 2.20 cm Kardie Tobb DO Electronically signed by Berniece Salines DO Signature Date/Time: 06/04/2022/4:45:52 PM    Final      LOS: 2 days   Signature  -    Lala Lund M.D on 06/06/2022 at 10:40 AM   -  To page go to [email or web address]

## 2022-06-07 DIAGNOSIS — J9601 Acute respiratory failure with hypoxia: Secondary | ICD-10-CM | POA: Diagnosis not present

## 2022-06-07 LAB — BASIC METABOLIC PANEL
Anion gap: 8 (ref 5–15)
BUN: 11 mg/dL (ref 8–23)
CO2: 24 mmol/L (ref 22–32)
Calcium: 8.7 mg/dL — ABNORMAL LOW (ref 8.9–10.3)
Chloride: 105 mmol/L (ref 98–111)
Creatinine, Ser: 0.96 mg/dL (ref 0.61–1.24)
GFR, Estimated: >60 mL/min (ref >60)
Glucose, Bld: 110 mg/dL — ABNORMAL HIGH (ref 70–99)
Potassium: 4.5 mmol/L (ref 3.5–5.1)
Sodium: 137 mmol/L (ref 135–145)

## 2022-06-07 NOTE — TOC Progression Note (Signed)
Transition of Care [facility]) - Progression Note    Patient Details  Name: Timothy Wood MRN: [id number] Date of Birth: 1940/10/30  [facility] CM/SW Contact  Emeterio Reeve, [place] Phone Number: 06/07/2022, 1:48 PM  Clinical Narrative:     CSW spoke with pts daughter Sharyn Lull by phone. CSW gave Sharyn Lull his 3 bed offers and directed her to the medicare.gov website. Sharyn Lull stated she will review and try to have an answer tomorrow.   Expected Discharge Plan: Skilled Nursing Facility Barriers to Discharge: Ship broker, Continued Medical Work up, SNF Pending bed offer  Expected Discharge Plan and Services In-house Referral: Clinical Social Work     Living arrangements for the past 2 months: Single Family Home                                       Social Determinants of Health (SDOH) Interventions SDOH Screenings   Food Insecurity: No Food Insecurity (05/10/2022)  Housing: Low Risk  (05/10/2022)  Transportation Needs: No Transportation Needs (05/10/2022)  Utilities: Not At Risk (05/10/2022)  Alcohol Screen: Low Risk  (05/08/2020)  Depression (PHQ2-9): Low Risk  (11/03/2021)  Recent Concern: Depression (PHQ2-9) - High Risk (10/13/2021)  Financial Resource Strain: Low Risk  (11/03/2021)  Physical Activity: Inactive (11/03/2021)  Social Connections: Socially Isolated (05/08/2020)  Stress: Stress Concern Present (11/03/2021)  Tobacco Use: High Risk (06/03/2022)    Readmission Risk Interventions     No data to display

## 2022-06-07 NOTE — Progress Notes (Signed)
PROGRESS NOTE        PATIENT DETAILS Name: Timothy Wood Age: 82 y.o. Sex: male Date of Birth: April 29, 1940 Admit Date: 06/03/2022 Admitting Physician Evalee Mutton Kristeen Mans, MD [id number], Malka So, MD  Brief Summary: Patient is a 82 y.o.  male history of VTE, adenocarcinoma of the lung-s/p VATS/wedge resection of right middle/upper lobe-2016, tobacco abuse-who presented with a mechanical fall-he was found to have L1 compression fracture.  He was also found to be hypoxemic-upon further evaluation-he was found to have PE on CT angiogram chest.  Significant events: 3/20>> admit to [facility].  Significant studies: 3/20>> x-ray lumbar/thoracic spine: Age indeterminate L1 superior endplate compression fracture, chronic T3 superior endplate vertebral body height loss, chronic stable L2-L3 superior endplate compression fracture. 3/20>> CTA chest:+ve PE greatest clot burden in the left upper lobe.  Right lower lobe patchy consolidation  3/21>> echo: EF [id number], RV systolic function normal. 3/22>> bilateral lower extremity Doppler:acute, occlusive DVT in the left distal femoral vein, popliteal vein, and extending into the gastroc and posterior tibial veins (verbal per technician)  Significant microbiology data: 3/21>> blood culture: neg  Procedures: None  Consults: None  Subjective:  Patient in bed, appears comfortable, denies any headache, no fever, no chest pain or pressure, no shortness of breath , no abdominal pain. No new focal weakness.   Objective: Vitals: Blood pressure (!) 142/78, pulse 69, temperature 97.8 F (36.6 C), temperature source Oral, resp. rate 17, height 5\' 8"  (1.727 m), weight 76 kg, SpO2 94 %.   Exam:  Awake Alert, No new F.N deficits, Normal affect [place].AT,PERRAL Supple Neck, No JVD,   Symmetrical Chest wall movement, Good air movement bilaterally, CTAB RRR,No Gallops, Rubs or new Murmurs,  +ve B.Sounds, Abd Soft, No tenderness,   No  Cyanosis, Clubbing or edema    Assessment/Plan:  Mechanical fall Presumed acute L1 compression fracture Numerous chronic healed fractures Neurosurgery recommending TLSO brace when OOB, no lifting> 10-15 pounds, follow-up with Dr. Marcello Moores in 2-4 weeks Mobilize with PT/OT Continue scheduled Tylenol As needed oxycodone for moderate pain, and as needed fentanyl for severe pain SNF planned n discharge   Acute hypoxic respiratory failure  Recurrent pulmonary embolism LLE DVT on Doppler 3/22 Aspiration pneumonia Prior history of PE 2017, remote history of DVT This current episode of VTE is due to noncompliance-does not appear that he has been taking Xarelto since August 2023-confirmed by calling his pharmacy on 3/22 Echo stable without RV strain Doppler with LLE DVT (prelim result per technician) Stop IV heparin-transition to Eliquis.Needs indefinite anticoagulation given recurrent VTE Stop IV Rocephin/Zithromax-transition to Augmentin-x 5 days total. Continue attempts to titrate down FiO2   HTN BP stable Continue amlodipine/Avapro and Coreg Follow/optimize  PAD-s/p iliac stenting 2010 AAA s/p repair 2012 Continue statin Suspect not on antiplatelets as on anticoagulation  COPD Continue bronchodilators Not in exacerbation  History of lung CA-s/p wedge resection 2016 Follows with Dr. Julien Nordmann Currently on observation  Tobacco abuse Counseled  EtOH use Reported drinking a half a pint of brandy before he fell Denies EtOH use on a daily basis Monitor closely on Ativan per CIWA protocol Currently no signs of withdrawals  Hypokalemia.  Replaced.    BMI: Estimated body mass index is 25.48 kg/m as calculated from the following:   Height as of this encounter: 5\' 8"  (1.727 m).   Weight as of this  encounter: 76 kg.   Code status:   Code Status: Full Code   DVT Prophylaxis:IV heparin   Family Communication: [id number] 3/22   Disposition  Plan: Status is: Observation The patient will require care spanning > 2 midnights and should be moved to inpatient because: Severity of illness   Planned Discharge Destination:SNF   Diet: Diet Order             Diet Heart Room service appropriate? Yes; Fluid consistency: Thin  Diet effective now                   MEDICATIONS: Scheduled Meds:  acetaminophen  1,000 mg Oral Q8H   amLODipine  5 mg Oral Daily   amoxicillin-clavulanate  1 tablet Oral Q12H   apixaban  10 mg Oral BID   Followed by   Derrill Memo ON 06/12/2022] apixaban  5 mg Oral BID   carvedilol  6.25 mg Oral BID   DULoxetine  30 mg Oral Daily   folic acid  1 mg Oral Daily   hydrALAZINE  50 mg Oral Q8H   irbesartan  150 mg Oral Daily   LORazepam  0-4 mg Oral Q12H   multivitamin with minerals  1 tablet Oral Daily   nicotine  21 mg Transdermal Daily   pantoprazole  40 mg Oral Daily   rosuvastatin  20 mg Oral Daily   sodium chloride flush  3 mL Intravenous Q12H   thiamine  100 mg Oral Daily   Or   thiamine  100 mg Intravenous Daily   Continuous Infusions:  PRN Meds:.albuterol, fentaNYL (SUBLIMAZE) injection, hydrALAZINE, methocarbamol, ondansetron **OR** ondansetron (ZOFRAN) IV, oxyCODONE   I have personally reviewed following labs and imaging studies  LABORATORY DATA:  Recent Labs  Lab 06/03/22 0825 06/04/22 0501 06/05/22 0615 06/06/22 0300  WBC 12.7* 14.1* 8.1 6.1  HGB 16.2 15.0 14.4 13.7  HCT 49.8 45.0 43.6 40.6  PLT 221 170 149* 140*  MCV 95.8 93.4 94.2 93.3  MCH 31.2 31.1 31.1 31.5  MCHC 32.5 33.3 33.0 33.7  RDW 13.4 13.1 13.1 12.7  LYMPHSABS 1.0  --   --  0.9  MONOABS 0.7  --   --  0.7  EOSABS 0.1  --   --  0.2  BASOSABS 0.0  --   --  0.0    Recent Labs  Lab 06/03/22 0825 06/04/22 0501 06/04/22 0808 06/06/22 0300 06/07/22 0349  NA 141 135  --  134* 137  K 3.5 3.6  --  3.4* 4.5  CL 102 102  --  103 105  CO2 25 24  --  25 24  ANIONGAP 14 9  --  6 8  GLUCOSE 102* 96  --  142* 110*   BUN 11 8  --  7* 11  CREATININE 0.94 0.83  --  0.79 0.96  AST 19  --   --   --   --   ALT 17  --   --   --   --   ALKPHOS 104  --   --   --   --   BILITOT 0.7  --   --   --   --   ALBUMIN 3.8  --   --   --   --   PROCALCITON  --  <0.10  --   --   --   LATICACIDVEN  --  1.2 1.1  --   --   BNP  --   --   --  62.1  --   MG  --  1.3*  --  2.0  --   CALCIUM 8.0* 8.1*  --  8.2* 8.7*    RADIOLOGY STUDIES/RESULTS:  VAS [place] LOWER EXTREMITY VENOUS (DVT)  Result Date: 06/06/2022  Lower Venous DVT Study Patient Name:  RAISTLIN GEBREMARIAM [facility]  Date of Exam:   06/05/2022 Medical Rec #: [id number]         Accession #:    [id number] Date of Birth: Sep 29, 1940        Patient Gender: M Patient Age:   43 years Exam Location:  [facility] Procedure:      VAS [place] LOWER EXTREMITY VENOUS (DVT) Referring Phys: Oren Binet --------------------------------------------------------------------------------  Indications: Pulmonary embolism. Other Indications: Patient endorses history of left lower extremity DVT, as well                    as new incidence of left posterior knee and calf pain x3                    weeks. Comparison Study: 06-03-2022 CTA positive for pulmonary embolism Performing Technologist: Darlin Coco RDMS, RVT  Examination Guidelines: A complete evaluation includes B-mode imaging, spectral Doppler, color Doppler, and power Doppler as needed of all accessible portions of each vessel. Bilateral testing is considered an integral part of a complete examination. Limited examinations for reoccurring indications may be performed as noted. The reflux portion of the exam is performed with the patient in reverse Trendelenburg.  +---------+---------------+---------+-----------+----------+--------------+ RIGHT    CompressibilityPhasicitySpontaneityPropertiesThrombus Aging +---------+---------------+---------+-----------+----------+--------------+ CFV      Full           Yes      Yes                                  +---------+---------------+---------+-----------+----------+--------------+ SFJ      Full                                                        +---------+---------------+---------+-----------+----------+--------------+ FV Prox  Full                                                        +---------+---------------+---------+-----------+----------+--------------+ FV Mid   Full                                                        +---------+---------------+---------+-----------+----------+--------------+ FV DistalFull                                                        +---------+---------------+---------+-----------+----------+--------------+ PFV      Full                                                        +---------+---------------+---------+-----------+----------+--------------+  POP      Full           Yes      Yes                                 +---------+---------------+---------+-----------+----------+--------------+ PTV      Full                                                        +---------+---------------+---------+-----------+----------+--------------+ PERO     Full                                                        +---------+---------------+---------+-----------+----------+--------------+   +---------+---------------+---------+-----------+----------+--------------+ LEFT     CompressibilityPhasicitySpontaneityPropertiesThrombus Aging +---------+---------------+---------+-----------+----------+--------------+ CFV      Full           Yes      Yes                                 +---------+---------------+---------+-----------+----------+--------------+ SFJ      Full                                                        +---------+---------------+---------+-----------+----------+--------------+ FV Prox  Full                                                         +---------+---------------+---------+-----------+----------+--------------+ FV Mid   Full                                                        +---------+---------------+---------+-----------+----------+--------------+ FV DistalNone           No       No                   Acute          +---------+---------------+---------+-----------+----------+--------------+ PFV      Full                                                        +---------+---------------+---------+-----------+----------+--------------+ POP      None           No       No                   Acute          +---------+---------------+---------+-----------+----------+--------------+  PTV      Partial        Yes      Yes                  Acute          +---------+---------------+---------+-----------+----------+--------------+ PERO     Full                                                        +---------+---------------+---------+-----------+----------+--------------+ Gastroc  None           No       No                   Acute          +---------+---------------+---------+-----------+----------+--------------+     Summary: RIGHT: - There is no evidence of deep vein thrombosis in the lower extremity.  - No cystic structure found in the popliteal fossa.  LEFT: - Findings consistent with acute deep vein thrombosis involving the left distal femoral vein, left popliteal vein, left posterior tibial veins, and left gastrocnemius veins.  - No cystic structure found in the popliteal fossa.  *See table(s) above for measurements and observations. Electronically signed by Orlie Pollen on 06/06/2022 at 10:58:16 AM.    Final      LOS: 3 days   Signature  -    Lala Lund M.D on 06/07/2022 at 9:11 AM   -  To page go to [email or web address]

## 2022-06-08 DIAGNOSIS — J9601 Acute respiratory failure with hypoxia: Secondary | ICD-10-CM | POA: Diagnosis not present

## 2022-06-08 MED ORDER — HYDRALAZINE HCL 50 MG PO TABS
100.0000 mg | ORAL_TABLET | Freq: Three times a day (TID) | ORAL | Status: DC
  Administered 2022-06-08 – 2022-06-09 (×3): 100 mg via ORAL
  Filled 2022-06-08 (×4): qty 2

## 2022-06-08 NOTE — Plan of Care (Signed)

## 2022-06-08 NOTE — Progress Notes (Signed)
PROGRESS NOTE        PATIENT DETAILS Name: Timothy Wood Age: 82 y.o. Sex: male Date of Birth: 09/12/1940 Admit Date: 06/03/2022 Admitting Physician Evalee Mutton Kristeen Mans, MD [id number], Malka So, MD  Brief Summary: Patient is a 82 y.o.  male history of VTE, adenocarcinoma of the lung-s/p VATS/wedge resection of right middle/upper lobe-2016, tobacco abuse-who presented with a mechanical fall-he was found to have L1 compression fracture.  He was also found to be hypoxemic-upon further evaluation-he was found to have PE on CT angiogram chest.  Significant events: 3/20>> admit to [facility].  Significant studies: 3/20>> x-ray lumbar/thoracic spine: Age indeterminate L1 superior endplate compression fracture, chronic T3 superior endplate vertebral body height loss, chronic stable L2-L3 superior endplate compression fracture. 3/20>> CTA chest:+ve PE greatest clot burden in the left upper lobe.  Right lower lobe patchy consolidation  3/21>> echo: EF [id number], RV systolic function normal. 3/22>> bilateral lower extremity Doppler:acute, occlusive DVT in the left distal femoral vein, popliteal vein, and extending into the gastroc and posterior tibial veins (verbal per technician)  Significant microbiology data: 3/21>> blood culture: neg  Procedures: None  Consults: None  Subjective:  Patient in bed, appears comfortable, denies any headache, no fever, no chest pain or pressure, no shortness of breath , no abdominal pain. No new focal weakness.   Objective: Vitals: Blood pressure (!) 159/72, pulse 62, temperature (!) 97.1 F (36.2 C), temperature source Oral, resp. rate 16, height 5\' 8"  (1.727 m), weight 76 kg, SpO2 95 %.   Exam:  Awake Alert, No new F.N deficits, Normal affect [place].AT,PERRAL Supple Neck, No JVD,   Symmetrical Chest wall movement, Good air movement bilaterally, CTAB RRR,No Gallops, Rubs or new Murmurs,  +ve B.Sounds, Abd Soft, No tenderness,   No  Cyanosis, Clubbing or edema    Assessment/Plan:  Mechanical fall Presumed acute L1 compression fracture Numerous chronic healed fractures Neurosurgery recommending TLSO brace when OOB, no lifting> 10-15 pounds, follow-up with Dr. Marcello Moores in 2-4 weeks Mobilize with PT/OT Continue scheduled Tylenol As needed oxycodone for moderate pain, and as needed fentanyl for severe pain SNF planned n discharge   Acute hypoxic respiratory failure  Recurrent pulmonary embolism LLE DVT on Doppler 3/22 Aspiration pneumonia Prior history of PE 2017, remote history of DVT This current episode of VTE is due to noncompliance-does not appear that he has been taking Xarelto since August 2023-confirmed by calling his pharmacy on 3/22 Echo stable without RV strain Doppler with LLE DVT (prelim result per technician) Stop IV heparin-transition to Eliquis.Needs indefinite anticoagulation given recurrent VTE Stop IV Rocephin/Zithromax-transition to Augmentin-x 5 days total. Continue attempts to titrate down FiO2   HTN BP stable Continue amlodipine/Avapro and Coreg -added hydralazine and adjusted further on 06/08/2022 for better control Follow/optimize  PAD-s/p iliac stenting 2010 AAA s/p repair 2012 Continue statin Suspect not on antiplatelets as on anticoagulation  COPD Continue bronchodilators Not in exacerbation  History of lung CA-s/p wedge resection 2016 Follows with Dr. Julien Nordmann Currently on observation  Tobacco abuse Counseled  EtOH use Reported drinking a half a pint of brandy before he fell Denies EtOH use on a daily basis Monitor closely on Ativan per CIWA protocol Currently no signs of withdrawals  Hypokalemia.  Replaced.    BMI: Estimated body mass index is 25.48 kg/m as calculated from the following:   Height as of this  encounter: 5\' 8"  (1.727 m).   Weight as of this encounter: 76 kg.   Code status:   Code Status: Full Code   DVT Prophylaxis:IV heparin   Family  Communication: [id number] 3/22   Disposition Plan: Status is: Observation The patient will require care spanning > 2 midnights and should be moved to inpatient because: Severity of illness   Planned Discharge Destination:SNF   Diet: Diet Order             Diet Heart Room service appropriate? Yes; Fluid consistency: Thin  Diet effective now                   MEDICATIONS: Scheduled Meds:  acetaminophen  1,000 mg Oral Q8H   amLODipine  5 mg Oral Daily   amoxicillin-clavulanate  1 tablet Oral Q12H   apixaban  10 mg Oral BID   Followed by   Derrill Memo ON 06/12/2022] apixaban  5 mg Oral BID   carvedilol  6.25 mg Oral BID   DULoxetine  30 mg Oral Daily   folic acid  1 mg Oral Daily   hydrALAZINE  50 mg Oral Q8H   irbesartan  150 mg Oral Daily   multivitamin with minerals  1 tablet Oral Daily   nicotine  21 mg Transdermal Daily   pantoprazole  40 mg Oral Daily   rosuvastatin  20 mg Oral Daily   sodium chloride flush  3 mL Intravenous Q12H   thiamine  100 mg Oral Daily   Or   thiamine  100 mg Intravenous Daily   Continuous Infusions:  PRN Meds:.albuterol, fentaNYL (SUBLIMAZE) injection, hydrALAZINE, methocarbamol, ondansetron **OR** ondansetron (ZOFRAN) IV, oxyCODONE   I have personally reviewed following labs and imaging studies  LABORATORY DATA:  Recent Labs  Lab 06/03/22 0825 06/04/22 0501 06/05/22 0615 06/06/22 0300  WBC 12.7* 14.1* 8.1 6.1  HGB 16.2 15.0 14.4 13.7  HCT 49.8 45.0 43.6 40.6  PLT 221 170 149* 140*  MCV 95.8 93.4 94.2 93.3  MCH 31.2 31.1 31.1 31.5  MCHC 32.5 33.3 33.0 33.7  RDW 13.4 13.1 13.1 12.7  LYMPHSABS 1.0  --   --  0.9  MONOABS 0.7  --   --  0.7  EOSABS 0.1  --   --  0.2  BASOSABS 0.0  --   --  0.0    Recent Labs  Lab 06/03/22 0825 06/04/22 0501 06/04/22 0808 06/06/22 0300 06/07/22 0349  NA 141 135  --  134* 137  K 3.5 3.6  --  3.4* 4.5  CL 102 102  --  103 105  CO2 25 24  --  25 24  ANIONGAP 14 9   --  6 8  GLUCOSE 102* 96  --  142* 110*  BUN 11 8  --  7* 11  CREATININE 0.94 0.83  --  0.79 0.96  AST 19  --   --   --   --   ALT 17  --   --   --   --   ALKPHOS 104  --   --   --   --   BILITOT 0.7  --   --   --   --   ALBUMIN 3.8  --   --   --   --   PROCALCITON  --  <0.10  --   --   --   LATICACIDVEN  --  1.2 1.1  --   --   BNP  --   --   --  62.1  --   MG  --  1.3*  --  2.0  --   CALCIUM 8.0* 8.1*  --  8.2* 8.7*    RADIOLOGY STUDIES/RESULTS:  No results found.   LOS: 4 days   Signature  -    Lala Lund M.D on 06/08/2022 at 9:16 AM   -  To page go to [email or web address]

## 2022-06-08 NOTE — TOC Progression Note (Addendum)
Transition of Care [facility]) - Progression Note    Patient Details  Name: Timothy Wood MRN: [id number] Date of Birth: [birth year]  Transition of Care [facility]) CM/SW [place], LCSW Phone Number: 06/08/2022, 8:50 AM  Clinical Narrative:    8:50am-CSW will submit for SNF insurance process once updated therapy note is in. CIWAs now 0 and no Ativan received.   CSW spoke with patient's daughter to go over SNF offers. She stated she is touring facilities today and will have a decision by 5pm.   3:45pm-CSW received call from patient's daughter. They have selected [facility] after touring. CSW made [facility] aware and will start insurance process with updated therapy note. Daughter requested [facility] for transport.   4:18pm-Insurance authorization process started, Ref# [id number].   Expected Discharge Plan: Skilled Nursing Facility Barriers to Discharge: Ship broker, Continued Medical Work up, SNF Pending bed offer  Expected Discharge Plan and Services In-house Referral: Clinical Social Work     Living arrangements for the past 2 months: Single Family Home                                       Social Determinants of Health (SDOH) Interventions SDOH Screenings   Food Insecurity: No Food Insecurity (05/10/2022)  Housing: Low Risk  (05/10/2022)  Transportation Needs: No Transportation Needs (05/10/2022)  Utilities: Not At Risk (05/10/2022)  Alcohol Screen: Low Risk  (05/08/2020)  Depression (PHQ2-9): Low Risk  (11/03/2021)  Recent Concern: Depression (PHQ2-9) - High Risk (10/13/2021)  Financial Resource Strain: Low Risk  (11/03/2021)  Physical Activity: Inactive (11/03/2021)  Social Connections: Socially Isolated (05/08/2020)  Stress: Stress Concern Present (11/03/2021)  Tobacco Use: High Risk (06/03/2022)    Readmission Risk Interventions     No data to display

## 2022-06-08 NOTE — Care Management Important Message (Signed)
Important Message  Patient Details  Name: Timothy Wood MRN: [id number] Date of Birth: [birth year]   Medicare Important Message Given:  Yes     [name] Montine Circle 06/08/2022, 3:42 PM

## 2022-06-08 NOTE — Progress Notes (Signed)
Physical Therapy Treatment Patient Details Name: Timothy Wood MRN: [id number] DOB: 1940-12-16 Today's Date: 06/08/2022   History of Present Illness Pt is a 82 y.o. M who presents 06/02/2021 after a fall with new oxygen requirement and mild L1 compression fracture and chronic, healed fractures. Pt also found to have PE. Significant PMH: lung CA, COPD, back pain.    PT Comments    Pt with improved pain tolerance and ability to ambulate today. Pt able to ambulate in the hallway with minG-minA for balance with use of RW, chair follow provided for decreased activity tolerance, with pt requiring a seated rest break. Pt with increased lateral sway when looking around the hallway and with fatigue, cueing provided for posture and proximity to RW and pt correcting but reverting with fatigue. Cueing required throughout for safety, pt reporting hallucinations, RN notified. Daughter present during session and educated on pt's deficits, everyone agreeable with established discharge recommendations of returning to SNF. Acute PT will continue to follow as appropriate to progress mobility.     Recommendations for follow up therapy are one component of a multi-disciplinary discharge planning process, led by the attending physician.  Recommendations may be updated based on patient status, additional functional criteria and insurance authorization.  Follow Up Recommendations  Can patient physically be transported by private vehicle: No    Assistance Recommended at Discharge Frequent or constant Supervision/Assistance  Patient can return home with the following Help with stairs or ramp for entrance;Assist for transportation;A little help with walking and/or transfers   Equipment Recommendations  BSC/3in1    Recommendations for Other Services       Precautions / Restrictions Precautions Precautions: Back;Fall Precaution Comments: Back precautions for comfort Required Braces or Orthoses: Spinal  Brace Spinal Brace: Thoracolumbosacral orthotic;Applied in sitting position Restrictions Weight Bearing Restrictions: No     Mobility  Bed Mobility Overal bed mobility: Needs Assistance Bed Mobility: Supine to Sit, Sit to Sidelying, Rolling Rolling: Supervision   Supine to sit: Supervision, HOB elevated   Sit to sidelying: Supervision General bed mobility comments: cued for log roll technique when returning to supine as pt not utilizing when going to sitting, utilizing bed rail, increased time needed but pain noted to be improved    Transfers Overall transfer level: Needs assistance Equipment used: Rolling walker (2 wheels) Transfers: Sit to/from Stand Sit to Stand: Min guard           General transfer comment: cueing for hand placement with each trial, minG for balance and safety    Ambulation/Gait Ambulation/Gait assistance: Min guard, Min assist Gait Distance (Feet): 75 Feet (75' second trial after seated rest break) Assistive device: Rolling walker (2 wheels) Gait Pattern/deviations: Step-through pattern, Decreased stride length, Shuffle, Drifts right/left, Decreased dorsiflexion - right, Decreased dorsiflexion - left, Knee flexed in stance - right, Knee flexed in stance - left, Trunk flexed Gait velocity: decreased     General Gait Details: cued for forward gaze and upright posture, pt with rounded shoulders and slight trunk flexion despite TLSO. MinG for balance initially but progressing to minA with fatigue and when looking around, increased lateral sway   Stairs             Wheelchair Mobility    Modified Rankin (Stroke Patients Only)       Balance Overall balance assessment: Needs assistance Sitting-balance support: Feet supported, Bilateral upper extremity supported Sitting balance-Leahy Scale: Fair Sitting balance - Comments: requires B UE support due to pain   Standing balance support:  Bilateral upper extremity supported, During functional  activity Standing balance-Leahy Scale: Poor Standing balance comment: reliant on  B UE support                            Cognition Arousal/Alertness: Awake/alert Behavior During Therapy: WFL for tasks assessed/performed Overall Cognitive Status: Impaired/Different from baseline Area of Impairment: Orientation, Following commands, Safety/judgement, Memory                 Orientation Level: Disoriented to, Time   Memory: Decreased short-term memory Following Commands: Follows one step commands with increased time, Follows one step commands consistently Safety/Judgement: Decreased awareness of safety, Decreased awareness of deficits     General Comments: Pt unable to state day or month, reoriented. Pt reports visual hallucinations, RN notified and blinds opened, educated pt on sitting up with blinds open and lights on during the day and then turning off everything when it is time to sleep        Exercises      General Comments General comments (skin integrity, edema, etc.): SPO2 ~90% or greater on room air but requiring a seated rest break due to fatigue      Pertinent Vitals/Pain Pain Assessment Pain Assessment: Faces Faces Pain Scale: Hurts even more Pain Location: back pain with prolonged/unsupported sitting Pain Descriptors / Indicators: Grimacing, Guarding, Moaning Pain Intervention(s): Limited activity within patient's tolerance, Monitored during session, Repositioned    Home Living                          Prior Function            PT Goals (current goals can now be found in the care plan section) Acute Rehab PT Goals Patient Stated Goal: less pain PT Goal Formulation: With patient Time For Goal Achievement: 06/17/22 Potential to Achieve Goals: Good Progress towards PT goals: Progressing toward goals    Frequency    Min 4X/week      PT Plan Frequency needs to be updated    Co-evaluation   Reason for Co-Treatment: For  patient/therapist safety PT goals addressed during session: Mobility/safety with mobility        AM-PAC PT "6 Clicks" Mobility   Outcome Measure  Help needed turning from your back to your side while in a flat bed without using bedrails?: A Little Help needed moving from lying on your back to sitting on the side of a flat bed without using bedrails?: A Lot Help needed moving to and from a bed to a chair (including a wheelchair)?: A Little Help needed standing up from a chair using your arms (e.g., wheelchair or bedside chair)?: A Little Help needed to walk in hospital room?: A Little Help needed climbing 3-5 steps with a railing? : Total 6 Click Score: 15    End of Session Equipment Utilized During Treatment: Gait belt Activity Tolerance: Patient tolerated treatment well Patient left: in bed;with call bell/phone within reach;with bed alarm set;with family/visitor present Nurse Communication: Mobility status PT Visit Diagnosis: Pain;Difficulty in walking, not elsewhere classified (R26.2) Pain - part of body:  (back)     Time: [id number] PT Time Calculation (min) (ACUTE ONLY): 30 min  Charges:  $Gait Training: 8-22 mins                     Charlynne Cousins, PT DPT Acute Rehabilitation Services Office [phone]    Lucita Ferrara  Glennon Mac 06/08/2022, 3:47 PM

## 2022-06-08 NOTE — Plan of Care (Signed)

## 2022-06-08 NOTE — Progress Notes (Signed)
Occupational Therapy Treatment Patient Details Name: Timothy Wood MRN: [id number] DOB: 1940/04/18 Today's Date: 06/08/2022   History of present illness Pt is a 82 y.o. M who presents 06/02/2021 after a fall with new oxygen requirement and mild L1 compression fracture and chronic, healed fractures. Pt also found to have PE. Significant PMH: lung CA, COPD, back pain.   OT comments  Pt eager to get OOB and walk to "get it over with." Less pain with transition movements, but remains reliant on B UE support in sitting. Pt requires min assist for UB dressing, total assist for donning back brace. Complaints of dizziness with ambulation, requiring seated rest break. VSS on RA, pt reports he smokes and O2 sats are typically around 91%. Pt reports visual hallucinations and disoriented to time, RN made aware. Continues to be appropriate for SNF level rehab.    Recommendations for follow up therapy are one component of a multi-disciplinary discharge planning process, led by the attending physician.  Recommendations may be updated based on patient status, additional functional criteria and insurance authorization.    Assistance Recommended at Discharge Frequent or constant Supervision/Assistance  Patient can return home with the following  A little help with walking and/or transfers;A lot of help with bathing/dressing/bathroom;Assistance with cooking/housework;Direct supervision/assist for medications management;Assist for transportation;Help with stairs or ramp for entrance   Equipment Recommendations  BSC/3in1    Recommendations for Other Services      Precautions / Restrictions Precautions Precautions: Back;Fall Precaution Comments: Back precautions for comfort Required Braces or Orthoses: Spinal Brace Spinal Brace: Thoracolumbosacral orthotic;Applied in sitting position       Mobility Bed Mobility Overal bed mobility: Needs Assistance Bed Mobility: Supine to Sit, Sit to Sidelying,  Rolling Rolling: Supervision   Supine to sit: Supervision   Sit to sidelying: Supervision General bed mobility comments: no physical assist, cues for log roll technique    Transfers Overall transfer level: Needs assistance Equipment used: Rolling walker (2 wheels) Transfers: Sit to/from Stand Sit to Stand: Min guard           General transfer comment: cues for hand placement     Balance Overall balance assessment: Needs assistance   Sitting balance-Leahy Scale: Fair Sitting balance - Comments: requires B UE support due to pain     Standing balance-Leahy Scale: Poor Standing balance comment: reliant on  B UE support                           ADL either performed or assessed with clinical judgement   ADL Overall ADL's : Needs assistance/impaired                 Upper Body Dressing : Minimal assistance;Sitting Upper Body Dressing Details (indicate cue type and reason): total assist for back brace Lower Body Dressing: Total assistance;Sitting/lateral leans Lower Body Dressing Details (indicate cue type and reason): pt unable to sit unsupported at EOB due to back pain             Functional mobility during ADLs: Minimal assistance;Rolling walker (2 wheels);Min guard      Extremity/Trunk Assessment              Vision       Perception     Praxis      Cognition Arousal/Alertness: Awake/alert Behavior During Therapy: WFL for tasks assessed/performed Overall Cognitive Status: Impaired/Different from baseline Area of Impairment: Orientation, Following commands, Safety/judgement, Memory  Orientation Level: Disoriented to, Time   Memory: Decreased short-term memory Following Commands: Follows one step commands with increased time, Follows one step commands consistently Safety/Judgement: Decreased awareness of safety, Decreased awareness of deficits     General Comments: pt endorsing visual hallucinations, RN  notified, educated pt and daughter in delirium precautions        Exercises      Shoulder Instructions       General Comments      Pertinent Vitals/ Pain       Pain Assessment Pain Assessment: Faces Faces Pain Scale: Hurts even more Pain Location: back pain with prolonged/unsupported sitting Pain Descriptors / Indicators: Grimacing, Guarding, Moaning Pain Intervention(s): Monitored during session, Repositioned  Home Living                                          Prior Functioning/Environment              Frequency  Min 2X/week        Progress Toward Goals  OT Goals(current goals can now be found in the care plan section)  Progress towards OT goals: Progressing toward goals  Acute Rehab OT Goals OT Goal Formulation: With patient Time For Goal Achievement: 06/18/22 Potential to Achieve Goals: Good  Plan Discharge plan remains appropriate    Co-evaluation    PT/OT/SLP Co-Evaluation/Treatment: Yes Reason for Co-Treatment: For patient/therapist safety          AM-PAC OT "6 Clicks" Daily Activity     Outcome Measure   Help from another person eating meals?: None Help from another person taking care of personal grooming?: A Little Help from another person toileting, which includes using toliet, bedpan, or urinal?: A Lot Help from another person bathing (including washing, rinsing, drying)?: A Lot Help from another person to put on and taking off regular upper body clothing?: A Lot Help from another person to put on and taking off regular lower body clothing?: Total 6 Click Score: 14    End of Session Equipment Utilized During Treatment: Rolling walker (2 wheels);Gait belt;Back brace  OT Visit Diagnosis: Unsteadiness on feet (R26.81);Other abnormalities of gait and mobility (R26.89);Pain   Activity Tolerance Patient tolerated treatment well   Patient Left with chair alarm set;in bed;with call bell/phone within reach;with  family/visitor present   Nurse Communication          Time: [id number] OT Time Calculation (min): 28 min  Charges: OT General Charges $OT Visit: 1 Visit OT Treatments $Self Care/Home Management : 8-22 mins  Cleta Alberts, OTR/L Acute Rehabilitation Services Office: [phone]   Malka So 06/08/2022, 11:02 AM

## 2022-06-09 DIAGNOSIS — J449 Chronic obstructive pulmonary disease, unspecified: Secondary | ICD-10-CM | POA: Diagnosis not present

## 2022-06-09 DIAGNOSIS — R41841 Cognitive communication deficit: Secondary | ICD-10-CM | POA: Diagnosis not present

## 2022-06-09 DIAGNOSIS — Z7401 Bed confinement status: Secondary | ICD-10-CM | POA: Diagnosis not present

## 2022-06-09 DIAGNOSIS — R531 Weakness: Secondary | ICD-10-CM | POA: Diagnosis not present

## 2022-06-09 DIAGNOSIS — Z9181 History of falling: Secondary | ICD-10-CM | POA: Diagnosis not present

## 2022-06-09 DIAGNOSIS — I2510 Atherosclerotic heart disease of native coronary artery without angina pectoris: Secondary | ICD-10-CM | POA: Diagnosis not present

## 2022-06-09 DIAGNOSIS — D72829 Elevated white blood cell count, unspecified: Secondary | ICD-10-CM | POA: Diagnosis not present

## 2022-06-09 DIAGNOSIS — I700 Atherosclerosis of aorta: Secondary | ICD-10-CM | POA: Diagnosis not present

## 2022-06-09 DIAGNOSIS — S32009A Unspecified fracture of unspecified lumbar vertebra, initial encounter for closed fracture: Secondary | ICD-10-CM | POA: Diagnosis not present

## 2022-06-09 DIAGNOSIS — R0902 Hypoxemia: Secondary | ICD-10-CM | POA: Diagnosis not present

## 2022-06-09 DIAGNOSIS — Z7189 Other specified counseling: Secondary | ICD-10-CM | POA: Diagnosis not present

## 2022-06-09 DIAGNOSIS — Z86718 Personal history of other venous thrombosis and embolism: Secondary | ICD-10-CM | POA: Diagnosis not present

## 2022-06-09 DIAGNOSIS — E782 Mixed hyperlipidemia: Secondary | ICD-10-CM | POA: Diagnosis not present

## 2022-06-09 DIAGNOSIS — F1010 Alcohol abuse, uncomplicated: Secondary | ICD-10-CM | POA: Diagnosis not present

## 2022-06-09 DIAGNOSIS — C3491 Malignant neoplasm of unspecified part of right bronchus or lung: Secondary | ICD-10-CM | POA: Diagnosis not present

## 2022-06-09 DIAGNOSIS — Z86711 Personal history of pulmonary embolism: Secondary | ICD-10-CM | POA: Diagnosis not present

## 2022-06-09 DIAGNOSIS — F4321 Adjustment disorder with depressed mood: Secondary | ICD-10-CM | POA: Diagnosis not present

## 2022-06-09 DIAGNOSIS — I10 Essential (primary) hypertension: Secondary | ICD-10-CM | POA: Diagnosis not present

## 2022-06-09 DIAGNOSIS — S32000D Wedge compression fracture of unspecified lumbar vertebra, subsequent encounter for fracture with routine healing: Secondary | ICD-10-CM | POA: Diagnosis not present

## 2022-06-09 DIAGNOSIS — F515 Nightmare disorder: Secondary | ICD-10-CM | POA: Diagnosis not present

## 2022-06-09 DIAGNOSIS — K5900 Constipation, unspecified: Secondary | ICD-10-CM | POA: Diagnosis not present

## 2022-06-09 DIAGNOSIS — M6281 Muscle weakness (generalized): Secondary | ICD-10-CM | POA: Diagnosis not present

## 2022-06-09 DIAGNOSIS — R2681 Unsteadiness on feet: Secondary | ICD-10-CM | POA: Diagnosis not present

## 2022-06-09 DIAGNOSIS — M4856XD Collapsed vertebra, not elsewhere classified, lumbar region, subsequent encounter for fracture with routine healing: Secondary | ICD-10-CM | POA: Diagnosis not present

## 2022-06-09 DIAGNOSIS — I82402 Acute embolism and thrombosis of unspecified deep veins of left lower extremity: Secondary | ICD-10-CM | POA: Diagnosis not present

## 2022-06-09 DIAGNOSIS — J9601 Acute respiratory failure with hypoxia: Secondary | ICD-10-CM | POA: Diagnosis not present

## 2022-06-09 DIAGNOSIS — F17200 Nicotine dependence, unspecified, uncomplicated: Secondary | ICD-10-CM | POA: Diagnosis not present

## 2022-06-09 LAB — CULTURE, BLOOD (ROUTINE X 2)
Culture: NO GROWTH
Culture: NO GROWTH
Special Requests: ADEQUATE
Special Requests: ADEQUATE

## 2022-06-09 MED ORDER — FOLIC ACID 1 MG PO TABS: 1.0000 mg | ORAL_TABLET | Freq: Every day | ORAL | Status: DC

## 2022-06-09 MED ORDER — NICOTINE 21 MG/24HR TD PT24: 21.0000 mg | MEDICATED_PATCH | Freq: Every day | TRANSDERMAL | 0 refills | Status: DC

## 2022-06-09 MED ORDER — VITAMIN B-1 100 MG PO TABS: 100.0000 mg | ORAL_TABLET | Freq: Every day | ORAL | 0 refills | Status: DC

## 2022-06-09 MED ORDER — APIXABAN 5 MG PO TABS: 5.0000 mg | ORAL_TABLET | Freq: Two times a day (BID) | ORAL | Status: DC

## 2022-06-09 MED ORDER — OXYCODONE HCL 5 MG PO TABS: 5.0000 mg | ORAL_TABLET | Freq: Four times a day (QID) | ORAL | 0 refills | Status: DC | PRN

## 2022-06-09 MED ORDER — APIXABAN 5 MG PO TABS: 10.0000 mg | ORAL_TABLET | Freq: Two times a day (BID) | ORAL | Status: DC

## 2022-06-09 NOTE — Discharge Summary (Signed)
Timothy Wood [id number] DOB: [birth year] DOA: 06/03/2022  PCP: [name], [name], MD  Admit date: 06/03/2022  Discharge date: 06/09/2022  Admitted From: Home   Disposition:  SNF   Recommendations for Outpatient Follow-up:   Follow up with PCP in 1-2 weeks  PCP Please obtain BMP/CBC, 2 view CXR in 1week,  (see Discharge instructions)   PCP Please follow up on the following pending results:    Home Health: None   Equipment/Devices: None  Consultations: None  Discharge Condition: Stable    CODE STATUS: Full    Diet Recommendation: Heart Healthy   Diet Order             Diet Heart Room service appropriate? Yes; Fluid consistency: Thin  Diet effective now                    Chief Complaint  Patient presents with   Fall     Brief history of present illness from the day of admission and additional interim summary    82 y.o.  male history of VTE, adenocarcinoma of the lung-s/p VATS/wedge resection of right middle/upper lobe-2016, tobacco abuse-who presented with a mechanical fall-he was found to have L1 compression fracture.  He was also found to be hypoxemic-upon further evaluation-he was found to have PE on CT angiogram chest.   Significant events: 3/20>> admit to [facility].   Significant studies: 3/20>> x-ray lumbar/thoracic spine: Age indeterminate L1 superior endplate compression fracture, chronic T3 superior endplate vertebral body height loss, chronic stable L2-L3 superior endplate compression fracture. 3/20>> CTA chest:+ve PE greatest clot burden in the left upper lobe.  Right lower lobe patchy consolidation  3/21>> echo: EF [id number], RV systolic function normal. 3/22>> bilateral lower extremity Doppler:acute, occlusive DVT in the left distal femoral vein, popliteal vein, and extending into the  gastroc and posterior tibial veins (verbal per technician)                                                                 Hospital Course   Mechanical fall Presumed acute L1 compression fracture Numerous chronic healed fractures Neurosurgery recommending TLSO brace when OOB, no lifting> 10-15 pounds, follow-up with Dr. Marcello Moores in 2-4 weeks Mobilize with PT/OT Continue scheduled Tylenol As needed oxycodone for moderate pain, and as needed fentanyl for severe pain SNF planned n discharge     Acute hypoxic respiratory failure  Recurrent pulmonary embolism LLE DVT on Doppler 3/22 Aspiration pneumonia Prior history of PE 2017, remote history of DVT This current episode of VTE is due to noncompliance-does not appear that he has been taking Xarelto since August 2023-confirmed by calling his pharmacy on 3/22 Echo stable without RV strain Doppler with LLE DVT (prelim result per technician) Stop IV heparin-transition to Eliquis.Needs indefinite anticoagulation given recurrent VTE, counseled  on compliance. Finished treatment with antibiotics, now on room air and symptom-free.     HTN BP stable Continue home regimen monitor blood pressure at SNF adjust dose as needed.   PAD-s/p iliac stenting 2010 AAA s/p repair 2012 Continue statin Suspect not on antiplatelets as on anticoagulation   COPD Continue bronchodilators Not in exacerbation   History of lung CA-s/p wedge resection 2016 Follows with Dr. Julien Nordmann Currently on observation   Tobacco abuse Counseled   EtOH use Counseled to quit, no signs of DTs.     Discharge diagnosis     Principal Problem:   Acute respiratory failure with hypoxia (HCC) Active Problems:   Closed compression fracture of L1 vertebra (HCC)   Fall at home, initial encounter   Leukocytosis   Hypocalcemia   Hx of pulmonary embolus   Chronic anticoagulation   History of DVT (deep vein thrombosis)   Essential hypertension   Hyperlipemia, mixed    Coronary atherosclerosis due to calcified coronary lesion of native artery   Alcohol use   TOBACCO ABUSE   Pulmonary embolism [facility])    Discharge instructions    Discharge Instructions     Discharge instructions   Complete by: As directed    Do not drive, operate heavy machinery, perform activities at heights, swimming or participation in water activities or provide baby sitting services until you have seen by Primary MD or a Neurologist and advised to do so again.  Follow with Primary MD [name], [name], MD in 7 days   Get CBC, CMP, 2 view Chest X ray -  checked next visit with your SNF MD   Activity: As tolerated with Full fall precautions use walker/cane & assistance as needed, wear the TLSO brace whenever out of the bed.  Disposition SNF  Diet: Heart Healthy   Special Instructions: If you have smoked or chewed Tobacco  in the last 2 yrs please stop smoking, stop any regular Alcohol  and or any Recreational drug use.  On your next visit with your primary care physician please Get Medicines reviewed and adjusted.  Please request your Prim.MD to go over all Hospital Tests and Procedure/Radiological results at the follow up, please get all Hospital records sent to your Prim MD by signing hospital release before you go home.  If you experience worsening of your admission symptoms, develop shortness of breath, life threatening emergency, suicidal or homicidal thoughts you must seek medical attention immediately by calling 911 or calling your MD immediately  if symptoms less severe.  You Must read complete instructions/literature along with all the possible adverse reactions/side effects for all the Medicines you take and that have been prescribed to you. Take any new Medicines after you have completely understood and accpet all the possible adverse reactions/side effects.       Discharge Medications   Allergies as of 06/09/2022       Reactions   Codeine Anaphylaxis    Hydrocodone Itching        Medication List     STOP taking these medications    rivaroxaban 10 MG Tabs tablet Commonly known as: XARELTO       TAKE these medications    Acetaminophen 500 MG capsule Take 1,000 mg by mouth daily as needed for fever.   amLODipine 5 MG tablet Commonly known as: NORVASC Take 1 tablet (5 mg total) by mouth daily.   apixaban 5 MG Tabs tablet Commonly known as: ELIQUIS Take 2 tablets (10 mg total) by mouth 2 (  two) times daily for 2 days.   apixaban 5 MG Tabs tablet Commonly known as: ELIQUIS Take 1 tablet (5 mg total) by mouth 2 (two) times daily. Start taking on: June 12, 2022   carvedilol 6.25 MG tablet Commonly known as: COREG Take 1 tablet (6.25 mg total) by mouth 2 (two) times daily.   DULoxetine 30 MG capsule Commonly known as: CYMBALTA TAKE ONE CAPSULE BY MOUTH DAILY   EPINEPHrine 0.3 mg/0.3 mL Soaj injection Commonly known as: EpiPen 2-Pak To use daily if needed for severe allergy reaction.   folic acid 1 MG tablet Commonly known as: FOLVITE Take 1 tablet (1 mg total) by mouth daily. Start taking on: June 10, 2022   furosemide 20 MG tablet Commonly known as: LASIX Take 1 tablet (20 mg total) by mouth daily as needed (for weight increase of 3 lbs overnight or 5 lbs in 1 week).   irbesartan 150 MG tablet Commonly known as: AVAPRO TAKE ONE TABLET BY MOUTH DAILY   magnesium oxide 400 MG tablet Commonly known as: MAG-OX Take 1 tablet (400 mg total) by mouth daily. For 3 days   nicotine 21 mg/24hr patch Commonly known as: NICODERM CQ - dosed in mg/24 hours Place 1 patch (21 mg total) onto the skin daily.   omeprazole 20 MG capsule Commonly known as: PRILOSEC TAKE ONE CAPSULE BY MOUTH TWICE A DAY BEFORE MEALS What changed: See the new instructions.   oxyCODONE 5 MG immediate release tablet Commonly known as: Oxy IR/ROXICODONE Take 1 tablet (5 mg total) by mouth every 6 (six) hours as needed for moderate pain.    potassium chloride 10 MEQ tablet Commonly known as: KLOR-CON Take 1 tablet (10 mEq total) by mouth daily as needed (with lasix).   rosuvastatin 20 MG tablet Commonly known as: CRESTOR TAKE ONE TABLET BY MOUTH DAILY   thiamine 100 MG tablet Commonly known as: Vitamin B-1 Take 1 tablet (100 mg total) by mouth daily. Start taking on: June 10, 2022   tiZANidine 2 MG tablet Commonly known as: ZANAFLEX TAKE ONE TABLET BY MOUTH TWICE A DAY AS NEEDED         Contact information for follow-up providers     [name], [name], MD. Schedule an appointment as soon as possible for a visit in 1 week(s).   Specialty: Family Medicine Contact information: [place] [address] [phone]         Vallarie Mare, MD. Schedule an appointment as soon as possible for a visit in 1 week(s).   Specialty: Neurosurgery Contact information: [address] [place] [address] [phone]              Contact information for after-discharge care     [facility] .   Service: Skilled Nursing Contact information: [id number] [place] [phone]                     Major procedures and Radiology Reports - PLEASE review detailed and final reports thoroughly  -     VAS [place] LOWER EXTREMITY VENOUS (DVT)  Result Date: 06/06/2022  Lower Venous DVT Study Patient Name:  Timothy Wood [facility]  Date of Exam:   06/05/2022 Medical Rec #: [id number]         Accession #:    [id number] Date of Birth: Jul 06, 1940        Patient  Gender: M Patient Age:   82 years Exam Location:  [facility] Procedure:      VAS [place] LOWER EXTREMITY VENOUS (DVT) Referring Phys: Oren Binet --------------------------------------------------------------------------------  Indications: Pulmonary embolism. Other Indications: Patient endorses history of left lower extremity DVT, as well                     as new incidence of left posterior knee and calf pain x3                    weeks. Comparison Study: 06-03-2022 CTA positive for pulmonary embolism Performing Technologist: Darlin Coco RDMS, RVT  Examination Guidelines: A complete evaluation includes B-mode imaging, spectral Doppler, color Doppler, and power Doppler as needed of all accessible portions of each vessel. Bilateral testing is considered an integral part of a complete examination. Limited examinations for reoccurring indications may be performed as noted. The reflux portion of the exam is performed with the patient in reverse Trendelenburg.  +---------+---------------+---------+-----------+----------+--------------+ RIGHT    CompressibilityPhasicitySpontaneityPropertiesThrombus Aging +---------+---------------+---------+-----------+----------+--------------+ CFV      Full           Yes      Yes                                 +---------+---------------+---------+-----------+----------+--------------+ SFJ      Full                                                        +---------+---------------+---------+-----------+----------+--------------+ FV Prox  Full                                                        +---------+---------------+---------+-----------+----------+--------------+ FV Mid   Full                                                        +---------+---------------+---------+-----------+----------+--------------+ FV DistalFull                                                        +---------+---------------+---------+-----------+----------+--------------+ PFV      Full                                                        +---------+---------------+---------+-----------+----------+--------------+ POP      Full           Yes      Yes                                 +---------+---------------+---------+-----------+----------+--------------+  PTV      Full                                                         +---------+---------------+---------+-----------+----------+--------------+ PERO     Full                                                        +---------+---------------+---------+-----------+----------+--------------+   +---------+---------------+---------+-----------+----------+--------------+ LEFT     CompressibilityPhasicitySpontaneityPropertiesThrombus Aging +---------+---------------+---------+-----------+----------+--------------+ CFV      Full           Yes      Yes                                 +---------+---------------+---------+-----------+----------+--------------+ SFJ      Full                                                        +---------+---------------+---------+-----------+----------+--------------+ FV Prox  Full                                                        +---------+---------------+---------+-----------+----------+--------------+ FV Mid   Full                                                        +---------+---------------+---------+-----------+----------+--------------+ FV DistalNone           No       No                   Acute          +---------+---------------+---------+-----------+----------+--------------+ PFV      Full                                                        +---------+---------------+---------+-----------+----------+--------------+ POP      None           No       No                   Acute          +---------+---------------+---------+-----------+----------+--------------+ PTV      Partial        Yes      Yes                  Acute          +---------+---------------+---------+-----------+----------+--------------+ PERO     Full                                                        +---------+---------------+---------+-----------+----------+--------------+  Gastroc  None           No       No                   Acute           +---------+---------------+---------+-----------+----------+--------------+     Summary: RIGHT: - There is no evidence of deep vein thrombosis in the lower extremity.  - No cystic structure found in the popliteal fossa.  LEFT: - Findings consistent with acute deep vein thrombosis involving the left distal femoral vein, left popliteal vein, left posterior tibial veins, and left gastrocnemius veins.  - No cystic structure found in the popliteal fossa.  *See table(s) above for measurements and observations. Electronically signed by Orlie Pollen on 06/06/2022 at 10:58:16 AM.    Final    ECHOCARDIOGRAM COMPLETE  Result Date: 06/04/2022    ECHOCARDIOGRAM REPORT   Patient Name:   Timothy Wood [facility] Date of Exam: 06/04/2022 Medical Rec #:  [id number]        Height:       68.0 in Accession #:    [id number]       Weight:       167.5 lb Date of Birth:  May 03, 1940       BSA:          1.896 m Patient Age:    55 years         BP:           159/66 mmHg Patient Gender: M                HR:           87 bpm. Exam Location:  Inpatient Procedure: 2D Echo, Color Doppler and Cardiac Doppler Indications:    Pulmonary Embolus  History:        Patient has prior history of Echocardiogram examinations, most                 recent 04/05/2020. CAD, COPD, Signs/Symptoms:Syncope; Risk                 Factors:Hypertension, Dyslipidemia and Former Smoker. DVT, Lung                 Cancer.  Sonographer:    Rolla Etienne Referring Phys: [id number] Rhetta Mura  Sonographer Comments: Image acquisition challenging due to COPD. IMPRESSIONS  1. Left ventricular ejection fraction, by estimation, is 60 to 65%. The left ventricle has normal function. The left ventricle has no regional wall motion abnormalities. Left ventricular diastolic parameters are consistent with Grade I diastolic dysfunction (impaired relaxation).  2. Right ventricular systolic function is normal. The right ventricular size is normal.  3. The mitral valve is normal in structure. No  evidence of mitral valve regurgitation. No evidence of mitral stenosis.  4. The aortic valve is normal in structure. Aortic valve regurgitation is not visualized. Aortic valve sclerosis is present, with no evidence of aortic valve stenosis.  5. The inferior vena cava is normal in size with greater than 50% respiratory variability, suggesting right atrial pressure of 3 mmHg. FINDINGS  Left Ventricle: Left ventricular ejection fraction, by estimation, is 60 to 65%. The left ventricle has normal function. The left ventricle has no regional wall motion abnormalities. The left ventricular internal cavity size was normal in size. There is  no left ventricular hypertrophy. Left ventricular diastolic parameters are consistent with Grade I diastolic dysfunction (impaired relaxation). Right Ventricle: The right ventricular size  is normal. No increase in right ventricular wall thickness. Right ventricular systolic function is normal. Left Atrium: Left atrial size was normal in size. Right Atrium: Right atrial size was normal in size. Pericardium: There is no evidence of pericardial effusion. Presence of epicardial fat layer. Mitral Valve: The mitral valve is normal in structure. No evidence of mitral valve regurgitation. No evidence of mitral valve stenosis. Tricuspid Valve: The tricuspid valve is normal in structure. Tricuspid valve regurgitation is not demonstrated. No evidence of tricuspid stenosis. Aortic Valve: The aortic valve is normal in structure. Aortic valve regurgitation is not visualized. Aortic valve sclerosis is present, with no evidence of aortic valve stenosis. Aortic valve mean gradient measures 3.0 mmHg. Aortic valve peak gradient measures 6.5 mmHg. Aortic valve area, by VTI measures 3.17 cm. Pulmonic Valve: The pulmonic valve was normal in structure. Pulmonic valve regurgitation is not visualized. No evidence of pulmonic stenosis. Aorta: The aortic root is normal in size and structure. Venous: The inferior  vena cava is normal in size with greater than 50% respiratory variability, suggesting right atrial pressure of 3 mmHg. IAS/Shunts: No atrial level shunt detected by color flow Doppler.  LEFT VENTRICLE PLAX 2D LVIDd:         4.40 cm     Diastology LVIDs:         2.70 cm     LV e' medial:    7.18 cm/s LV PW:         1.10 cm     LV E/e' medial:  8.0 LV IVS:        1.10 cm     LV e' lateral:   5.98 cm/s LVOT diam:     2.20 cm     LV E/e' lateral: 9.5 LV SV:         67 LV SV Index:   35 LVOT Area:     3.80 cm  LV Volumes (MOD) LV vol d, MOD A2C: 33.7 ml LV vol d, MOD A4C: 58.8 ml LV vol s, MOD A2C: 14.1 ml LV vol s, MOD A4C: 20.3 ml LV SV MOD A2C:     19.6 ml LV SV MOD A4C:     58.8 ml LV SV MOD BP:      28.1 ml RIGHT VENTRICLE RV Basal diam:  2.70 cm RV Mid diam:    2.50 cm RV S prime:     19.10 cm/s TAPSE (M-mode): 1.9 cm LEFT ATRIUM             Index        RIGHT ATRIUM           Index LA diam:        3.30 cm 1.74 cm/m   RA Area:     11.40 cm LA Vol (A2C):   20.4 ml 10.76 ml/m  RA Volume:   21.50 ml  11.34 ml/m LA Vol (A4C):   25.8 ml 13.61 ml/m LA Biplane Vol: 23.0 ml 12.13 ml/m  AORTIC VALVE AV Area (Vmax):    2.89 cm AV Area (Vmean):   2.82 cm AV Area (VTI):     3.17 cm AV Vmax:           127.00 cm/s AV Vmean:          86.000 cm/s AV VTI:            0.211 m AV Peak Grad:      6.5 mmHg AV Mean Grad:      3.0  mmHg LVOT Vmax:         96.40 cm/s LVOT Vmean:        63.900 cm/s LVOT VTI:          0.176 m LVOT/AV VTI ratio: 0.83 MITRAL VALVE               TRICUSPID VALVE MV Area (PHT): 3.60 cm    TR Peak grad:   23.2 mmHg MV Decel Time: 211 msec    TR Vmax:        241.00 cm/s MV E velocity: 57.10 cm/s MV A velocity: 83.30 cm/s  SHUNTS MV E/A ratio:  0.69        Systemic VTI:  0.18 m                            Systemic Diam: 2.20 cm Kardie Tobb DO Electronically signed by Berniece Salines DO Signature Date/Time: 06/04/2022/4:45:52 PM    Final    CT Angio Chest Pulmonary Embolism (PE) W or WO Contrast  Result Date:  06/04/2022 CLINICAL DATA:  Pulmonary embolism suspected.  High probability. EXAM: CT ANGIOGRAPHY CHEST WITH CONTRAST TECHNIQUE: Multidetector CT imaging of the chest was performed using the standard protocol during bolus administration of intravenous contrast. Multiplanar CT image reconstructions and MIPs were obtained to evaluate the vascular anatomy. RADIATION DOSE REDUCTION: This exam was performed according to the departmental dose-optimization program which includes automated exposure control, adjustment of the mA and/or kV according to patient size and/or use of iterative reconstruction technique. CONTRAST:  66mL OMNIPAQUE IOHEXOL 350 MG/ML SOLN COMPARISON:  Portable chest today, chest radiograph 08/12/2018, chest CT with contrast 03/19/2022, chest CT with contrast 03/18/2021. FINDINGS: Cardiovascular: On the left, there is linear nonocclusive thrombus in the distal main pulmonary artery and straddling the first division of the left lower lobe artery with scattered downstream subsegmental small-vessel clots in the posterior basal distribution. There is a nonoccluding thrombus partially filling the left upper lobe anterior segmental artery with extension into the first order subsegmental arteries of the vessel. The left lung arteries are otherwise clear. On the right, there is a partially occluding thrombus in the interlobar artery to the middle lobe with nonocclusive thrombus in the medial and lateral segmental main arteries. There are scattered subsegmental arterial clots in the small vessels in the posterior basal right lower lobe distribution. No upper lobe emboli are seen. The heart has mildly enlarged since the prior studies and the pulmonary trunk and main arteries are increased in caliber now upper limit of normal. The RV/LV ratio is upper limits of normal at 0.9 but there is no IVC or hepatic vein reflux concerning for acute right heart strain. The pulmonary veins are decompressed. There is patchy  moderate aortic calcific plaque, mild tortuosity in the descending segment, scattered calcifications in the great vessels but no aneurysm, stenosis or dissection. Stable three-vessel calcific CAD is again seen greatest in the LAD and right coronary arteries. There is no pericardial effusion. Mediastinum/Nodes: No interval change is seen in slightly prominent right hilar lymph nodes up to 1 cm in short axis, subcarinal nodes up to 1.1 cm in short axis, and right paratracheal lymph nodes up to 0.9 cm in short axis. There is no new or enlarging adenopathy. No thyroid or axillary mass. The thoracic trachea and thoracic esophagus are unremarkable. Lungs/Pleura: There is increased diffuse bronchial thickening. Surgical changes of posterior right upper lobe wedge resection are again shown. The lungs  are mildly emphysematous with centrilobular changes predominating. No pleural effusion or pneumothorax is seen. There is increased right lower lobe patchy consolidation, posteriorly in the superior segment and in the posterior basal right lower lobe, which is favored due to pneumonia or aspiration as there is fluid and debris in the distal right main and main lower lobe bronchi. Component of pulmonary infarction could certainly be present as well. There is increased scattered linear atelectasis in the bilateral lung bases but no other confluent consolidation. There is no pleural effusion or pneumothorax. Clustered micronodular disease is again noted posteriorly in the left upper lobe on 7: 41-45 most likely indicates chronic small airways disease. Remaining lungs are generally clear. Upper Abdomen: Stable appearance of multiple small left hepatic cysts. Slight capsular nodularity over portions of the left lobe is again noted in likely indicating early cirrhosis. No splenomegaly or ascites is seen and no portal vein dilatation. There are no acute upper abdominal findings. Chronic nodular thickening noted both adrenal glands,  stable right renal cysts. Musculoskeletal: There are chronic mild upper plate anterior wedge deformities of the T2 and T3 vertebral bodies. Osteopenia and mild thoracic spondylosis. No acute or significant osseous findings or destructive lesions. Review of the MIP images confirms the above findings. IMPRESSION: 1. Positive for bilateral pulmonary emboli with the clot burden greatest in the left upper lobe anterior segmental artery and in the interlobar artery to the middle lobe. Small overall clot burden. 2. Borderline RV/LV ratio but no IVC or hepatic vein reflux concerning for acute right heart strain. The pulmonary trunk and main arteries have become borderline for size. 3. Mild cardiomegaly. 4. Aortic and coronary artery atherosclerosis. 5. COPD with increased diffuse bronchial thickening. 6. Right lower lobe patchy consolidation favored due to pneumonia or aspiration with fluid and debris in the distal right main and main lower lobe bronchi. Component of pulmonary infarction could certainly be present as well. 7. Clustered micronodular disease posteriorly in the left upper lobe most likely due to chronic small airways disease. 8. Stable mildly prominent right hilar and mediastinal lymph nodes. 9. Stable left hepatic cysts and slight capsular nodularity over the left lobe of the liver which could be due to early cirrhosis. No splenomegaly or ascites. 10. Chronic nodular thickening of both adrenal glands. 11. Osteopenia and degenerative change. Chronic mild T2 and T3 compression fractures. Aortic Atherosclerosis (ICD10-I70.0). Electronically Signed   By: Telford Nab M.D.   On: 06/04/2022 00:08   DG Chest 1 View  Result Date: 06/03/2022 CLINICAL DATA:  Pain after fall EXAM: CHEST  1 VIEW COMPARISON:  08/12/2018 x-ray.  CT 03/19/2022 FINDINGS: There is some scarring atelectatic changes in the right mid to lower lung. Surgical changes noted on the prior CT scan. No consolidation, pneumothorax or effusion. No  edema. Normal cardiopericardial silhouette. Overlapping cardiac leads IMPRESSION: No acute cardiopulmonary disease. Electronically Signed   By: Jill Side M.D.   On: 06/03/2022 12:06   DG Lumbar Spine Complete  Result Date: 06/03/2022 CLINICAL DATA:  fall upper lumbar / lower thoracic pain; 190176 Fall 190176 EXAM: LUMBAR SPINE - COMPLETE 4+ VIEW; THORACIC SPINE 2 VIEWS COMPARISON:  CT chest 03/19/2022, x-ray lumbar spine 10/23/2019 FINDINGS: Limited evaluation due to overlapping osseous structures and overlying soft tissues. There is no evidence of thoracic spine fracture. Chronic T3 superior endplate vertebral body height loss not well visualized due to overlying osseous structures. Interval development of an age-indeterminate L1 superior endplate compression fracture with at least 40% vertebral body height  loss. Chronic stable L2 through L3 superior endplate compression fractures. Multilevel mild degenerative changes of the thoracic spine. Multilevel moderate degenerative changes of the lumbar spine with associated moderate severe intervertebral disc space narrowing at the L4-L5 L5-S1 levels. Alignment is normal. Atherosclerotic plaque. Aortoiliac stent. IMPRESSION: 1. Interval development of an age-indeterminate L1 superior endplate compression fracture with at least 40% vertebral body height loss. 2. No acute displaced fracture or traumatic listhesis of the thoracic spine. 3. Chronic T3 superior endplate vertebral body height loss not well visualized due to overlying osseous structures. 4. Chronic stable L2 through L3 superior endplate compression fractures. 5. Limited evaluation due to overlapping osseous structures and overlying soft tissues. 6.  Aortic Atherosclerosis (ICD10-I70.0). Aortoiliac stent. Electronically Signed   By: Iven Finn M.D.   On: 06/03/2022 09:39   DG Thoracic Spine 2 View  Result Date: 06/03/2022 CLINICAL DATA:  fall upper lumbar / lower thoracic pain; 190176 Fall 190176  EXAM: LUMBAR SPINE - COMPLETE 4+ VIEW; THORACIC SPINE 2 VIEWS COMPARISON:  CT chest 03/19/2022, x-ray lumbar spine 10/23/2019 FINDINGS: Limited evaluation due to overlapping osseous structures and overlying soft tissues. There is no evidence of thoracic spine fracture. Chronic T3 superior endplate vertebral body height loss not well visualized due to overlying osseous structures. Interval development of an age-indeterminate L1 superior endplate compression fracture with at least 40% vertebral body height loss. Chronic stable L2 through L3 superior endplate compression fractures. Multilevel mild degenerative changes of the thoracic spine. Multilevel moderate degenerative changes of the lumbar spine with associated moderate severe intervertebral disc space narrowing at the L4-L5 L5-S1 levels. Alignment is normal. Atherosclerotic plaque. Aortoiliac stent. IMPRESSION: 1. Interval development of an age-indeterminate L1 superior endplate compression fracture with at least 40% vertebral body height loss. 2. No acute displaced fracture or traumatic listhesis of the thoracic spine. 3. Chronic T3 superior endplate vertebral body height loss not well visualized due to overlying osseous structures. 4. Chronic stable L2 through L3 superior endplate compression fractures. 5. Limited evaluation due to overlapping osseous structures and overlying soft tissues. 6.  Aortic Atherosclerosis (ICD10-I70.0). Aortoiliac stent. Electronically Signed   By: Iven Finn M.D.   On: 06/03/2022 09:39    Micro Results    Recent Results (from the past 240 hour(s))  Culture, blood (Routine X 2) w Reflex to ID Panel     Status: None (Preliminary result)   Collection Time: 06/04/22  4:46 AM   Specimen: BLOOD  Result Value Ref Range Status   Specimen Description BLOOD LEFT ANTECUBITAL  Final   Special Requests   Final    BOTTLES DRAWN AEROBIC AND ANAEROBIC Blood Culture adequate volume   Culture   Final    NO GROWTH 4 DAYS Performed  at [facility], [place] [address] [place], [place] [address]    Report Status PENDING  Incomplete  Culture, blood (Routine X 2) w Reflex to ID Panel     Status: None (Preliminary result)   Collection Time: 06/04/22  4:49 AM   Specimen: BLOOD RIGHT HAND  Result Value Ref Range Status   Specimen Description BLOOD RIGHT HAND  Final   Special Requests   Final    BOTTLES DRAWN AEROBIC AND ANAEROBIC Blood Culture adequate volume   Culture   Final    NO GROWTH 4 DAYS Performed at [facility], [place] [address], [place], [place] [address]    Report Status PENDING  Incomplete    Today   Subjective  Timothy Wood today has no headache,no chest abdominal pain,no new weakness tingling or numbness, feels much better wants to go home today.    Objective   Blood pressure 116/62, pulse 77, temperature (!) 97.5 F (36.4 C), temperature source Oral, resp. rate 20, height 5\' 8"  (1.727 m), weight 76 kg, SpO2 94 %.   Intake/Output Summary (Last 24 hours) at 06/09/2022 0915 Last data filed at 06/09/2022 0800 Gross per 24 hour  Intake 3 ml  Output 320 ml  Net -317 ml    Exam  Awake Alert, No new F.N deficits,    [place].AT,PERRAL Supple Neck,   Symmetrical Chest wall movement, Good air movement bilaterally, CTAB RRR,No Gallops,   +ve B.Sounds, Abd Soft, Non tender,  No Cyanosis, Clubbing or edema    Data Review   Recent Labs  Lab 06/03/22 0825 06/04/22 0501 06/05/22 0615 06/06/22 0300  WBC 12.7* 14.1* 8.1 6.1  HGB 16.2 15.0 14.4 13.7  HCT 49.8 45.0 43.6 40.6  PLT 221 170 149* 140*  MCV 95.8 93.4 94.2 93.3  MCH 31.2 31.1 31.1 31.5  MCHC 32.5 33.3 33.0 33.7  RDW 13.4 13.1 13.1 12.7  LYMPHSABS 1.0  --   --  0.9  MONOABS 0.7  --   --  0.7  EOSABS 0.1  --   --  0.2  BASOSABS 0.0  --   --  0.0    Recent Labs  Lab 06/03/22 0825 06/04/22 0501 06/04/22 0808 06/06/22 0300 06/07/22 0349  NA 141 135  --  134* 137  K 3.5 3.6  --  3.4* 4.5  CL 102 102  --  103 105  CO2  25 24  --  25 24  ANIONGAP 14 9  --  6 8  GLUCOSE 102* 96  --  142* 110*  BUN 11 8  --  7* 11  CREATININE 0.94 0.83  --  0.79 0.96  AST 19  --   --   --   --   ALT 17  --   --   --   --   ALKPHOS 104  --   --   --   --   BILITOT 0.7  --   --   --   --   ALBUMIN 3.8  --   --   --   --   PROCALCITON  --  <0.10  --   --   --   LATICACIDVEN  --  1.2 1.1  --   --   BNP  --   --   --  62.1  --   MG  --  1.3*  --  2.0  --   CALCIUM 8.0* 8.1*  --  8.2* 8.7*   Total Time in preparing paper work, data evaluation and todays exam - 35 minutes  Signature  -    Lala Lund M.D on 06/09/2022 at 9:15 AM   -  To page go to [email or web address]

## 2022-06-09 NOTE — TOC Progression Note (Signed)
Transition of Care [facility]) - Progression Note    Patient Details  Name: Timothy Wood MRN: [id number] Date of Birth: 02/18/1941  Transition of Care [facility]) CM/SW [place], LCSW Phone Number: 06/09/2022, 2:37 PM  Clinical Narrative:    Insurance approval received for [facility], Ref# [id number], Auth ID# [id number], effective 06/09/2022-06/11/2022. CSW updated patient's daughter. She requested PTAR for transport.    Expected Discharge Plan: Skilled Nursing Facility Barriers to Discharge: Insurance Authorization, Continued Medical Work up, SNF Pending bed offer  Expected Discharge Plan and Services In-house Referral: Clinical Social Work     Living arrangements for the past 2 months: Single Family Home Expected Discharge Date: 06/09/22                                     Social Determinants of Health (SDOH) Interventions SDOH Screenings   Food Insecurity: No Food Insecurity (05/10/2022)  Housing: Low Risk  (05/10/2022)  Transportation Needs: No Transportation Needs (05/10/2022)  Utilities: Not At Risk (05/10/2022)  Alcohol Screen: Low Risk  (05/08/2020)  Depression (PHQ2-9): Low Risk  (11/03/2021)  Recent Concern: Depression (PHQ2-9) - High Risk (10/13/2021)  Financial Resource Strain: Low Risk  (11/03/2021)  Physical Activity: Inactive (11/03/2021)  Social Connections: Socially Isolated (05/08/2020)  Stress: Stress Concern Present (11/03/2021)  Tobacco Use: High Risk (06/03/2022)    Readmission Risk Interventions     No data to display

## 2022-06-09 NOTE — TOC Transition Note (Addendum)
Transition of Care [facility]) - CM/SW Discharge Note   Patient Details  Name: Timothy Wood MRN: [id number] Date of Birth: May 28, 1940  Transition of Care [facility]) CM/SW Contact:  Benard Halsted, LCSW Phone Number: 06/09/2022, 2:38 PM   Clinical Narrative:    Patient will DC to: [facility] Anticipated DC date: 06/09/22 Family notified: Daughter, Arts administrator by: Corey Harold   Per MD patient ready for DC to [facility]. RN to call report prior to discharge [phone] room 123). RN, patient, patient's family, and facility notified of DC. Discharge Summary and FL2 sent to facility. DC packet on chart. Ambulance transport requested for patient.   CSW will sign off for now as social work intervention is no longer needed. Please consult [place] again if new needs arise.     Final next level of care: Skilled Nursing Facility Barriers to Discharge: Barriers Resolved   Patient Goals and CMS Choice CMS Medicare.gov Compare Post Acute Care list provided to:: Patient Choice offered to / list presented to : Patient, Adult Children  Discharge Placement     Existing PASRR number confirmed : 06/09/22          Patient chooses bed at: [facility] Patient to be transferred to facility by: [place] Name of family member notified: Daughter Patient and family notified of of transfer: 06/09/22  Discharge Plan and Services Additional resources added to the After Visit Summary for   In-house Referral: Clinical Social Work                                   Social Determinants of Health (SDOH) Interventions SDOH Screenings   Food Insecurity: No Food Insecurity (05/10/2022)  Housing: Low Risk  (05/10/2022)  Transportation Needs: No Transportation Needs (05/10/2022)  Utilities: Not At Risk (05/10/2022)  Alcohol Screen: Low Risk  (05/08/2020)  Depression (PHQ2-9): Low Risk  (11/03/2021)  Recent Concern: Depression (PHQ2-9) - High Risk (10/13/2021)  Financial Resource Strain: Low Risk   (11/03/2021)  Physical Activity: Inactive (11/03/2021)  Social Connections: Socially Isolated (05/08/2020)  Stress: Stress Concern Present (11/03/2021)  Tobacco Use: High Risk (06/03/2022)     Readmission Risk Interventions     No data to display

## 2022-06-09 NOTE — Progress Notes (Signed)
Gave report to [place], LPN at [facility].

## 2022-06-09 NOTE — Plan of Care (Signed)

## 2022-06-09 NOTE — Progress Notes (Signed)
Physical Therapy Treatment Patient Details Name: Timothy Wood MRN: [id number] DOB: 01/06/1941 Today's Date: 06/09/2022   History of Present Illness Pt is a 82 y.o. M who presents 06/02/2021 after a fall with new oxygen requirement and mild L1 compression fracture and chronic, healed fractures. Pt also found to have PE. Significant PMH: lung CA, COPD, back pain.    PT Comments    Pt tolerated today's session well, limited mildly by fatigue and dizziness, requiring minA for sit<>stand with RW today, but progressing ambulation distance. Educated pt on sitting upright in bed, declining any further attempts at transfers or sitting in the chair today but HOB elevated at end of session. Acute PT will continue to follow pt to progress mobility, discharge plan remains appropriate.     Recommendations for follow up therapy are one component of a multi-disciplinary discharge planning process, led by the attending physician.  Recommendations may be updated based on patient status, additional functional criteria and insurance authorization.  Follow Up Recommendations  Can patient physically be transported by private vehicle: No    Assistance Recommended at Discharge Frequent or constant Supervision/Assistance  Patient can return home with the following Help with stairs or ramp for entrance;Assist for transportation;A little help with walking and/or transfers   Equipment Recommendations  BSC/3in1    Recommendations for Other Services       Precautions / Restrictions Precautions Precautions: Back;Fall Precaution Comments: Back precautions for comfort Required Braces or Orthoses: Spinal Brace Spinal Brace: Thoracolumbosacral orthotic;Applied in sitting position Restrictions Weight Bearing Restrictions: No     Mobility  Bed Mobility Overal bed mobility: Needs Assistance Bed Mobility: Supine to Sit, Sit to Sidelying, Rolling Rolling: Supervision   Supine to sit: Supervision, HOB  elevated   Sit to sidelying: Supervision General bed mobility comments: pt utilizing bed rail, increased time required but able to complete    Transfers Overall transfer level: Needs assistance Equipment used: Rolling walker (2 wheels) Transfers: Sit to/from Stand Sit to Stand: Min assist           General transfer comment: cued for proper hand placement, requiring minA for power up x2 trials today, limited by pain    Ambulation/Gait Ambulation/Gait assistance: Min guard Gait Distance (Feet): 125 Feet (with 1 standing rest break) Assistive device: Rolling walker (2 wheels) Gait Pattern/deviations: Step-through pattern, Decreased stride length, Shuffle, Drifts right/left, Decreased dorsiflexion - right, Decreased dorsiflexion - left, Knee flexed in stance - right, Knee flexed in stance - left, Trunk flexed Gait velocity: decreased     General Gait Details: cued for forward gaze and proximity to RW. MinG for balance   Stairs             Wheelchair Mobility    Modified Rankin (Stroke Patients Only)       Balance Overall balance assessment: Needs assistance Sitting-balance support: Feet supported, Bilateral upper extremity supported Sitting balance-Leahy Scale: Fair Sitting balance - Comments: requires B UE support due to pain   Standing balance support: Bilateral upper extremity supported, During functional activity Standing balance-Leahy Scale: Poor Standing balance comment: reliant on  B UE support                            Cognition Arousal/Alertness: Awake/alert Behavior During Therapy: WFL for tasks assessed/performed Overall Cognitive Status: Impaired/Different from baseline Area of Impairment: Orientation, Following commands, Safety/judgement, Memory  Orientation Level: Disoriented to, Time   Memory: Decreased short-term memory Following Commands: Follows one step commands with increased time, Follows one step  commands consistently Safety/Judgement: Decreased awareness of safety, Decreased awareness of deficits     General Comments: pt continues to report visual hallucinations, RN notified, able to state year and month, not date        Exercises      General Comments General comments (skin integrity, edema, etc.): SPO2 90% or greater on room air throughout mobility, pt reports dizziness with ambulation, BP taken when back in room and reading 93/57, pt reports relief once back in supine      Pertinent Vitals/Pain Pain Assessment Pain Assessment: Faces Faces Pain Scale: Hurts even more Pain Location: back pain with mobility Pain Descriptors / Indicators: Grimacing, Guarding, Moaning Pain Intervention(s): Limited activity within patient's tolerance, Monitored during session, Repositioned    Home Living                          Prior Function            PT Goals (current goals can now be found in the care plan section) Acute Rehab PT Goals Patient Stated Goal: less pain PT Goal Formulation: With patient Time For Goal Achievement: 06/17/22 Potential to Achieve Goals: Good Progress towards PT goals: Progressing toward goals    Frequency    Min 4X/week      PT Plan Current plan remains appropriate    Co-evaluation              AM-PAC PT "6 Clicks" Mobility   Outcome Measure  Help needed turning from your back to your side while in a flat bed without using bedrails?: A Little Help needed moving from lying on your back to sitting on the side of a flat bed without using bedrails?: A Little Help needed moving to and from a bed to a chair (including a wheelchair)?: A Little Help needed standing up from a chair using your arms (e.g., wheelchair or bedside chair)?: A Little Help needed to walk in hospital room?: A Little Help needed climbing 3-5 steps with a railing? : Total 6 Click Score: 16    End of Session Equipment Utilized During Treatment: Gait  belt;Back brace Activity Tolerance: Patient tolerated treatment well Patient left: in bed;with call bell/phone within reach;with bed alarm set Nurse Communication: Mobility status PT Visit Diagnosis: Pain;Difficulty in walking, not elsewhere classified (R26.2) Pain - part of body:  (back)     Time: 0942-1000 PT Time Calculation (min) (ACUTE ONLY): 18 min  Charges:  $Therapeutic Activity: 8-22 mins                     Charlynne Cousins, PT DPT Acute Rehabilitation Services Office [phone]    Timothy Wood 06/09/2022, 1:16 PM

## 2022-06-09 NOTE — Progress Notes (Signed)
Called [facility] at [phone] to give report for pt going to room 123.  Receptionist tried to transfer me but no one answered, receptionist took my name and phone number and will have nurse give me a call back.

## 2022-06-10 DIAGNOSIS — K5900 Constipation, unspecified: Secondary | ICD-10-CM | POA: Diagnosis not present

## 2022-06-10 DIAGNOSIS — Z86718 Personal history of other venous thrombosis and embolism: Secondary | ICD-10-CM | POA: Diagnosis not present

## 2022-06-10 DIAGNOSIS — S32009A Unspecified fracture of unspecified lumbar vertebra, initial encounter for closed fracture: Secondary | ICD-10-CM | POA: Diagnosis not present

## 2022-06-10 DIAGNOSIS — Z9181 History of falling: Secondary | ICD-10-CM | POA: Diagnosis not present

## 2022-06-10 DIAGNOSIS — M6281 Muscle weakness (generalized): Secondary | ICD-10-CM | POA: Diagnosis not present

## 2022-06-10 DIAGNOSIS — I10 Essential (primary) hypertension: Secondary | ICD-10-CM | POA: Diagnosis not present

## 2022-06-10 DIAGNOSIS — I82402 Acute embolism and thrombosis of unspecified deep veins of left lower extremity: Secondary | ICD-10-CM | POA: Diagnosis not present

## 2022-06-10 DIAGNOSIS — R2681 Unsteadiness on feet: Secondary | ICD-10-CM | POA: Diagnosis not present

## 2022-06-10 DIAGNOSIS — M4856XD Collapsed vertebra, not elsewhere classified, lumbar region, subsequent encounter for fracture with routine healing: Secondary | ICD-10-CM | POA: Diagnosis not present

## 2022-06-11 DIAGNOSIS — K5900 Constipation, unspecified: Secondary | ICD-10-CM | POA: Diagnosis not present

## 2022-06-17 DIAGNOSIS — M6281 Muscle weakness (generalized): Secondary | ICD-10-CM | POA: Diagnosis not present

## 2022-06-17 DIAGNOSIS — Z9181 History of falling: Secondary | ICD-10-CM | POA: Diagnosis not present

## 2022-06-17 DIAGNOSIS — Z86718 Personal history of other venous thrombosis and embolism: Secondary | ICD-10-CM | POA: Diagnosis not present

## 2022-06-17 DIAGNOSIS — R2681 Unsteadiness on feet: Secondary | ICD-10-CM | POA: Diagnosis not present

## 2022-06-17 DIAGNOSIS — M4856XD Collapsed vertebra, not elsewhere classified, lumbar region, subsequent encounter for fracture with routine healing: Secondary | ICD-10-CM | POA: Diagnosis not present

## 2022-06-18 DIAGNOSIS — I82402 Acute embolism and thrombosis of unspecified deep veins of left lower extremity: Secondary | ICD-10-CM | POA: Diagnosis not present

## 2022-06-18 DIAGNOSIS — I2510 Atherosclerotic heart disease of native coronary artery without angina pectoris: Secondary | ICD-10-CM | POA: Diagnosis not present

## 2022-06-18 DIAGNOSIS — I10 Essential (primary) hypertension: Secondary | ICD-10-CM | POA: Diagnosis not present

## 2022-06-18 DIAGNOSIS — S32009A Unspecified fracture of unspecified lumbar vertebra, initial encounter for closed fracture: Secondary | ICD-10-CM | POA: Diagnosis not present

## 2022-06-19 DIAGNOSIS — S32000D Wedge compression fracture of unspecified lumbar vertebra, subsequent encounter for fracture with routine healing: Secondary | ICD-10-CM | POA: Diagnosis not present

## 2022-06-19 DIAGNOSIS — Z9181 History of falling: Secondary | ICD-10-CM | POA: Diagnosis not present

## 2022-06-19 DIAGNOSIS — Z86711 Personal history of pulmonary embolism: Secondary | ICD-10-CM | POA: Diagnosis not present

## 2022-06-22 DIAGNOSIS — S32018D Other fracture of first lumbar vertebra, subsequent encounter for fracture with routine healing: Secondary | ICD-10-CM | POA: Diagnosis not present

## 2022-06-22 DIAGNOSIS — J449 Chronic obstructive pulmonary disease, unspecified: Secondary | ICD-10-CM | POA: Diagnosis not present

## 2022-06-22 DIAGNOSIS — I2510 Atherosclerotic heart disease of native coronary artery without angina pectoris: Secondary | ICD-10-CM | POA: Diagnosis not present

## 2022-06-22 DIAGNOSIS — M1991 Primary osteoarthritis, unspecified site: Secondary | ICD-10-CM | POA: Diagnosis not present

## 2022-06-22 DIAGNOSIS — I10 Essential (primary) hypertension: Secondary | ICD-10-CM | POA: Diagnosis not present

## 2022-06-22 DIAGNOSIS — E7800 Pure hypercholesterolemia, unspecified: Secondary | ICD-10-CM | POA: Diagnosis not present

## 2022-06-22 DIAGNOSIS — K219 Gastro-esophageal reflux disease without esophagitis: Secondary | ICD-10-CM | POA: Diagnosis not present

## 2022-06-22 DIAGNOSIS — G8929 Other chronic pain: Secondary | ICD-10-CM | POA: Diagnosis not present

## 2022-06-22 DIAGNOSIS — J9601 Acute respiratory failure with hypoxia: Secondary | ICD-10-CM | POA: Diagnosis not present

## 2022-06-23 DIAGNOSIS — S32000A Wedge compression fracture of unspecified lumbar vertebra, initial encounter for closed fracture: Secondary | ICD-10-CM | POA: Diagnosis not present

## 2022-06-24 ENCOUNTER — Telehealth: Payer: Self-pay

## 2022-06-24 NOTE — Progress Notes (Signed)
Patient ID: Timothy Wood, male   DOB: 02-15-1941, 82 y.o.   MRN: [id number]  Care Management & Coordination Services Pharmacy Team  Reason for Encounter: Hypertension  Contacted patient to discuss hypertension disease state. Spoke with patient on 06/24/2022     Current antihypertensive regimen:  Amlodipine 5mg  1 qd  Carvedilol 6.25mg  1 BID Lasix 20mg  1qd prn  Irbesartan 150mg  1 qd   Patient verbally confirms he is taking the above medications as directed. Yes  How often are you checking your Blood Pressure? weekly  Patient reports he has been going to therapy after a fall and they have been checking this past week for him while he is there. He reports readings of 111/70, 117/68 he states he typically runs within that range. He further reports he has been making sure to remember all of his medications and not miss doses. He denies any hypotensive symptoms.   Any readings above 180/100? No If yes any symptoms of hypertensive emergency? patient denies any symptoms of high blood pressure  What recent interventions/DTPs have been made by any provider to improve Blood Pressure control since last CPP Visit: Patient reports none  Any recent hospitalizations or ED visits since last visit with CPP? Yes Patient reports he fell and injured his back.  What diet changes have been made to improve Blood Pressure Control?  Patient reports none  What exercise is being done to improve your Blood Pressure Control?  Patient reports he has been in therapy the past week or so.  Adherence Review: Is the patient currently on ACE/ARB medication? Yes Does the patient have >5 day gap between last estimated fill dates? No  Star Rating Drugs:  Irbesartan 150 mg - Last filled 04/09/22 90 DS at [facility] Rosuvastatin 20 mg - Last filled 04/01/22 90 DS at [facility] Follow up - 09/2022  Chart Updates: Recent office visits:  None   Recent consult visits:  06/23/22 Roney Jaffe MD Jillyn Hidden - Patient  presented to [facility] for back pain. No medication changes.   Hospital visits:  Medication Reconciliation was completed by comparing discharge summary, patient's EMR and Pharmacy list, and upon discussion with patient.  Patient [facility]22 due to Acute respiratory failure with hypoxia. Patient was present for 6 days.  New?Medications Started at [facility] Discharge:?? -started  apixaban (ELIQUIS) folic acid (FOLVITE) nicotine (NICODERM CQ - dosed in mg/24 hours) oxyCODONE (Oxy IR/ROXICODONE) thiamine (Vitamin B-1)  Medication Changes at Hospital Discharge: -Changed  none  Medications Discontinued at Hospital Discharge: -Stopped  rivaroxaban 10 MG Tabs tablet  Medications that remain the same after Hospital Discharge:??  -All other medications will remain the same.    Medications: Outpatient Encounter Medications as of 06/24/2022  Medication Sig   Acetaminophen 500 MG capsule Take 1,000 mg by mouth daily as needed for fever.   amLODipine (NORVASC) 5 MG tablet Take 1 tablet (5 mg total) by mouth daily.   apixaban (ELIQUIS) 5 MG TABS tablet Take 2 tablets (10 mg total) by mouth 2 (two) times daily for 2 days.   apixaban (ELIQUIS) 5 MG TABS tablet Take 1 tablet (5 mg total) by mouth 2 (two) times daily.   carvedilol (COREG) 6.25 MG tablet Take 1 tablet (6.25 mg total) by mouth 2 (two) times daily.   DULoxetine (CYMBALTA) 30 MG capsule TAKE ONE CAPSULE BY MOUTH DAILY   EPINEPHrine (EPIPEN 2-PAK) 0.3 mg/0.3 mL IJ SOAJ injection To use daily  if needed for severe allergy reaction.   folic acid (FOLVITE) 1 MG tablet Take 1 tablet (1 mg total) by mouth daily.   furosemide (LASIX) 20 MG tablet Take 1 tablet (20 mg total) by mouth daily as needed (for weight increase of 3 lbs overnight or 5 lbs in 1 week).   irbesartan (AVAPRO) 150 MG tablet TAKE ONE TABLET BY MOUTH DAILY   magnesium oxide (MAG-OX) 400 MG tablet Take 1  tablet (400 mg total) by mouth daily. For 3 days   nicotine (NICODERM CQ - DOSED IN MG/24 HOURS) 21 mg/24hr patch Place 1 patch (21 mg total) onto the skin daily.   omeprazole (PRILOSEC) 20 MG capsule TAKE ONE CAPSULE BY MOUTH TWICE A DAY BEFORE MEALS (Patient taking differently: Take 20 mg by mouth daily.)   oxyCODONE (OXY IR/ROXICODONE) 5 MG immediate release tablet Take 1 tablet (5 mg total) by mouth every 6 (six) hours as needed for moderate pain.   potassium chloride (KLOR-CON) 10 MEQ tablet Take 1 tablet (10 mEq total) by mouth daily as needed (with lasix).   rosuvastatin (CRESTOR) 20 MG tablet TAKE ONE TABLET BY MOUTH DAILY   thiamine (VITAMIN B-1) 100 MG tablet Take 1 tablet (100 mg total) by mouth daily.   tiZANidine (ZANAFLEX) 2 MG tablet TAKE ONE TABLET BY MOUTH TWICE A DAY AS NEEDED   No facility-administered encounter medications on file as of 06/24/2022.    Recent Office Vitals: BP Readings from Last 3 Encounters:  06/09/22 130/68  05/10/22 117/65  04/20/22 (!) 140/90   Pulse Readings from Last 3 Encounters:  06/09/22 86  05/10/22 74  04/20/22 75    Wt Readings from Last 3 Encounters:  06/03/22 167 lb 8.8 oz (76 kg)  04/20/22 169 lb (76.7 kg)  01/23/22 174 lb 6.4 oz (79.1 kg)     Kidney Function Lab Results  Component Value Date/Time   CREATININE 0.96 06/07/2022 03:49 AM   CREATININE 0.79 06/06/2022 03:00 AM   CREATININE 1.11 03/19/2022 03:03 PM   CREATININE 1.05 03/18/2021 10:42 AM   CREATININE 1.2 10/12/2016 09:58 AM   CREATININE 1.0 04/06/2016 11:29 AM   GFR 62.52 08/14/2020 12:09 PM   GFRNONAA >60 06/07/2022 03:49 AM   GFRNONAA >60 03/19/2022 03:03 PM   GFRAA 87 03/14/2020 02:07 PM   GFRAA >60 05/26/2019 08:40 AM       Latest Ref Rng & Units 06/07/2022    3:49 AM 06/06/2022    3:00 AM 06/04/2022    5:01 AM  BMP  Glucose 70 - 99 mg/dL 408  144  96   BUN 8 - 23 mg/dL 11  7  8    Creatinine 0.61 - 1.24 mg/dL 8.18  5.63  1.49   Sodium 135 - 145 mmol/L  137  134  135   Potassium 3.5 - 5.1 mmol/L 4.5  3.4  3.6   Chloride 98 - 111 mmol/L 105  103  102   CO2 22 - 32 mmol/L 24  25  24    Calcium 8.9 - 10.3 mg/dL 8.7  8.2  8.1        Pamala Duffel CMA Clinical Pharmacist Assistant [phone]

## 2022-06-26 DIAGNOSIS — I2510 Atherosclerotic heart disease of native coronary artery without angina pectoris: Secondary | ICD-10-CM | POA: Diagnosis not present

## 2022-06-26 DIAGNOSIS — K219 Gastro-esophageal reflux disease without esophagitis: Secondary | ICD-10-CM | POA: Diagnosis not present

## 2022-06-26 DIAGNOSIS — M1991 Primary osteoarthritis, unspecified site: Secondary | ICD-10-CM | POA: Diagnosis not present

## 2022-06-26 DIAGNOSIS — J9601 Acute respiratory failure with hypoxia: Secondary | ICD-10-CM | POA: Diagnosis not present

## 2022-06-26 DIAGNOSIS — J449 Chronic obstructive pulmonary disease, unspecified: Secondary | ICD-10-CM | POA: Diagnosis not present

## 2022-06-26 DIAGNOSIS — I10 Essential (primary) hypertension: Secondary | ICD-10-CM | POA: Diagnosis not present

## 2022-06-26 DIAGNOSIS — G8929 Other chronic pain: Secondary | ICD-10-CM | POA: Diagnosis not present

## 2022-06-26 DIAGNOSIS — E7800 Pure hypercholesterolemia, unspecified: Secondary | ICD-10-CM | POA: Diagnosis not present

## 2022-06-26 DIAGNOSIS — S32018D Other fracture of first lumbar vertebra, subsequent encounter for fracture with routine healing: Secondary | ICD-10-CM | POA: Diagnosis not present

## 2022-06-30 NOTE — Progress Notes (Unsigned)
HPI: [name] is a 82 y.o. male with PMHx significant for HTN, depression, HLD, CAD, recurrent PE, adenocarcinoma of the lung-s/p VATS/wedge resection of right middle/upper lobe-2016, and tobacco abuse- here today with his daughter to follow on recent hospital visit. Admitted on 06/03/22 and discharged to SNF on 06/09/22 and discharged home on 06/21/22.  Presented to the ER after a mechanical fall-he was found to have L1 compression fracture. Apparently he was feeding outside cats after drinking alcohol, lost balance and fell.  In the ER he was also found to be hypoxemic-upon further evaluation-he was found to have PE on CT angiogram chest.  He has not been compliant with oral anticoagulation. He was changed from Eliquis to Xarelto because he was forgetting taking the 2nd Eliquis dose. Xarelto was expensive, so he went back to Eliquis but not consistently.  06/05/22 bilateral lower extremity Doppler:acute, occlusive DVT in the left distal femoral vein, popliteal vein, and extending into the gastroc and posterior tibial veins   HTN on Amlodipine 5 mg daily, Carvedilol 6.25 mg bid,and Irbesartan 150 mg daily.  Lab Results  Component Value Date   CREATININE 0.96 06/07/2022   BUN 11 06/07/2022   NA 137 06/07/2022   K 4.5 06/07/2022   CL 105 06/07/2022   CO2 24 06/07/2022   Lab Results  Component Value Date   WBC 6.1 06/06/2022   HGB 13.7 06/06/2022   HCT 40.6 06/06/2022   MCV 93.3 06/06/2022   PLT 140 (L) 06/06/2022   Following with ortho. Lower back pain is gradually improving. Oxycodone was discontinued because it caused severe constipation. He is taking Tylenol 500 mg a few times per day.  He is on Duloxetine 30 mg daily to help with depression and chronic back pain. Zanaflex 4 mg daily as needed, has not taken medication for a while. Alcohol dependency, he attended [facility] years ago but stopped after his wife died.  -COPD: He is not on supplemental O2. He is not on  bronchodilators. Still smoking.  -Hypomagnesemia: He is not on Mg supplementation. Last Mg 2.0, it was 1.3 on 04/20/22.  -Pruritic rash on LE's, which he has had intermittently for a while. He has not tried OTC treatments. He is not sure about exacerbating or alleviating factors.  His daughter is arranging his medications weekly and checking on him regularly. HH has been arranged, PT x 2 weekly and OT weekly. He has a walker to help with transfer. Appetite has improved. He is not driving.  Review of Systems  Constitutional:  Positive for fatigue. Negative for chills and fever.  HENT:  Negative for sore throat and trouble swallowing.   Respiratory:  Negative for cough and wheezing.   Gastrointestinal:  Negative for abdominal pain, blood in stool, nausea and vomiting.  Genitourinary:  Negative for decreased urine volume, dysuria and hematuria.  Musculoskeletal:  Positive for arthralgias, back pain and gait problem.  Skin:  Positive for rash.  Neurological:  Negative for syncope and facial asymmetry.  Psychiatric/Behavioral:  Negative for confusion and hallucinations.   See other pertinent positives and negatives in HPI.  Current Outpatient Medications on File Prior to Visit  Medication Sig Dispense Refill   Acetaminophen 500 MG capsule Take 1,000 mg by mouth daily as needed for fever.     amLODipine (NORVASC) 5 MG tablet Take 1 tablet (5 mg total) by mouth daily. 90 tablet 1   apixaban (ELIQUIS) 5 MG TABS tablet Take 1 tablet (5 mg total) by mouth 2 (two)  times daily. 60 tablet    carvedilol (COREG) 6.25 MG tablet Take 1 tablet (6.25 mg total) by mouth 2 (two) times daily. 180 tablet 1   DULoxetine (CYMBALTA) 30 MG capsule TAKE ONE CAPSULE BY MOUTH DAILY 90 capsule 2   EPINEPHrine (EPIPEN 2-PAK) 0.3 mg/0.3 mL IJ SOAJ injection To use daily if needed for severe allergy reaction. 1 each 0   folic acid (FOLVITE) 1 MG tablet Take 1 tablet (1 mg total) by mouth daily.     furosemide  (LASIX) 20 MG tablet Take 1 tablet (20 mg total) by mouth daily as needed (for weight increase of 3 lbs overnight or 5 lbs in 1 week). 30 tablet 3   irbesartan (AVAPRO) 150 MG tablet TAKE ONE TABLET BY MOUTH DAILY 90 tablet 1   magnesium oxide (MAG-OX) 400 MG tablet Take 1 tablet (400 mg total) by mouth daily. For 3 days 3 tablet 0   nicotine (NICODERM CQ - DOSED IN MG/24 HOURS) 21 mg/24hr patch Place 1 patch (21 mg total) onto the skin daily. 28 patch 0   omeprazole (PRILOSEC) 20 MG capsule TAKE ONE CAPSULE BY MOUTH TWICE A DAY BEFORE MEALS (Patient taking differently: Take 20 mg by mouth daily.) 180 capsule 1   oxyCODONE (OXY IR/ROXICODONE) 5 MG immediate release tablet Take 1 tablet (5 mg total) by mouth every 6 (six) hours as needed for moderate pain. 5 tablet 0   potassium chloride (KLOR-CON) 10 MEQ tablet Take 1 tablet (10 mEq total) by mouth daily as needed (with lasix). 30 tablet 3   rosuvastatin (CRESTOR) 20 MG tablet TAKE ONE TABLET BY MOUTH DAILY 90 tablet 2   thiamine (VITAMIN B-1) 100 MG tablet Take 1 tablet (100 mg total) by mouth daily. 30 tablet 0   tiZANidine (ZANAFLEX) 2 MG tablet TAKE ONE TABLET BY MOUTH TWICE A DAY AS NEEDED 60 tablet 3   apixaban (ELIQUIS) 5 MG TABS tablet Take 2 tablets (10 mg total) by mouth 2 (two) times daily for 2 days. 60 tablet    No current facility-administered medications on file prior to visit.   Past Medical History:  Diagnosis Date   Adenocarcinoma of lung, stage 1    Anemia    Angiodysplasia of cecum 11/23/2017   Multiple 1-2 mm   Arthritis    back possibly   Blood clotting disorder    Cataract    removed bilaterally    Chronic obstructive pulmonary disease (COPD)    "patient not aware"   Clotting disorder    PE LUNG, dvt leg    Colon polyps    Depression    Diverticulosis    DVT (deep venous thrombosis)    Post op  knee surgery   DVT (deep venous thrombosis)    GERD (gastroesophageal reflux disease)    Histoplasmosis     Histoplasmosis with pneumonia 04/01/2015   Hypercholesteremia    Hyperlipidemia    Hypertension    Laceration of head 08/05/2015   Lumbar compression fracture    Lung cancer dx'd 2016   Personal history of colonic polyps 2008   multiple adenomas 2008-2009 exams with large cecal adenoma   Pneumonia    in history   Polyposis coli - attenuated 09/12/2010   2008: adenomas with 9 polyps index and 2.5 cm cecal polyp removed over 3 exams, last 2009 with APC. diverticulosis also. 11/24/2010: 12 polyps removed - adenomas Anticipate routine repeat colonoscopy Spring 2013 Have recommended he see genetics counselor re ? Polyposis 03/27/2016  14 polyps removed max 15 mm    Pulmonary nodule 06/2006   9 mm right upper obe pulmonary nodule (negative Pet Scan)   PVD (peripheral vascular disease)    left leg s/p stent   Sebaceous cyst    neck   Seborrheic keratosis    Seizures    4-5 yrs ago had seizures when in ICU- none since this time    Sinusitis    Tobacco abuse    Tremor    Allergies  Allergen Reactions   Codeine Anaphylaxis   Hydrocodone Itching   Social History   Socioeconomic History   Marital status: Widowed    Spouse name: Not on file   Number of children: 2   Years of education: Not on file   Highest education level: Not on file  Occupational History   Occupation: Retired    Associate Professor: RETIRED  Tobacco Use   Smoking status: Every Day    Packs/day: 1.00    Years: 60.00    Additional pack years: 0.00    Total pack years: 60.00    Types: Cigarettes   Smokeless tobacco: Never  Vaping Use   Vaping Use: Never used  Substance and Sexual Activity   Alcohol use: Yes    Comment: beer on occasion   Drug use: No   Sexual activity: Not Currently  Other Topics Concern   Not on file  Social History Narrative       Married for over 50 years.  Wife died from lung cancer.     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     Occasional alcohol.      Tobacco use -  over 50 pack years.   Father died at age 51 of suicide.   Elon Jester Ellis-daughter emergenc   y contact   Social Determinants of Health   Financial Resource Strain: Low Risk  (11/03/2021)   Overall Financial Resource Strain (CARDIA)    Difficulty of Paying Living Expenses: Not hard at all  Food Insecurity: No Food Insecurity (05/10/2022)   Hunger Vital Sign    Worried About Running Out of Food in the Last Year: Never true    Ran Out of Food in the Last Year: Never true  Transportation Needs: No Transportation Needs (05/10/2022)   PRAPARE - Administrator, Civil Service (Medical): No    Lack of Transportation (Non-Medical): No  Physical Activity: Inactive (11/03/2021)   Exercise Vital Sign    Days of Exercise per Week: 0 days    Minutes of Exercise per Session: 0 min  Stress: Stress Concern Present (11/03/2021)   [employer] of Occupational Health - Occupational Stress Questionnaire    Feeling of Stress : To some extent  Social Connections: Socially Isolated (05/08/2020)   Social Connection and Isolation Panel [NHANES]    Frequency of Communication with Friends and Family: More than three times a week    Frequency of Social Gatherings with Friends and Family: More than three times a week    Attends Religious Services: Never    Database administrator or Organizations: No    Attends Banker Meetings: Never    Marital Status: Widowed   Vitals:   07/01/22 1529  BP: 128/80  Pulse: 100  Resp: 16  SpO2: 95%   Body mass index is 24.65 kg/m.  Physical Exam Vitals and nursing note reviewed.  Constitutional:      General: He is not in  acute distress.    Appearance: He is well-developed and normal weight.  HENT:     Head: Normocephalic and atraumatic.     Mouth/Throat:     Mouth: Mucous membranes are moist.     Dentition: Has dentures.  Eyes:     Conjunctiva/sclera: Conjunctivae normal.  Cardiovascular:     Rate and Rhythm: Normal rate and regular  rhythm.     Heart sounds: No murmur heard.    Comments: DP pulses palpable. Pulmonary:     Effort: Pulmonary effort is normal. No respiratory distress.     Breath sounds: Normal breath sounds.  Abdominal:     Palpations: Abdomen is soft. There is no hepatomegaly or mass.     Tenderness: There is no abdominal tenderness.  Skin:    General: Skin is warm.     Findings: Rash (On pretibial areas.) present. No erythema. Rash is crusting and papular.  Neurological:     Mental Status: He is alert and oriented to person, place, and time.     Cranial Nerves: No cranial nerve deficit.     Comments: Unstable gait,assisted with a walker.  Psychiatric:        Mood and Affect: Mood and affect normal.   ASSESSMENT AND PLAN:  [name] was seen today for hospitalization follow-up.  Diagnoses and all orders for this visit: Lab Results  Component Value Date   WBC 6.1 07/01/2022   HGB 14.9 07/01/2022   HCT 43.8 07/01/2022   MCV 92.9 07/01/2022   PLT 236.0 07/01/2022   Lab Results  Component Value Date   CREATININE 0.95 07/01/2022   BUN 12 07/01/2022   NA 141 07/01/2022   K 4.0 07/01/2022   CL 105 07/01/2022   CO2 27 07/01/2022  Essential hypertension Assessment & Plan: BP adequately controlled. Continue amlodipine 5 mg daily, carvedilol 6.25 mg twice daily, and he will be Sartain 150 mg daily. Continue monitoring BP regularly. Low-salt diet also recommended. Follow-up in 2 months.  Orders: -     Basic metabolic panel; Future -     Irbesartan; Take 1 tablet (150 mg total) by mouth daily.  Dispense: 90 tablet; Refill: 2  Hypomagnesemia Assessment & Plan: He is not on magnesium supplementation. We discussed possible complications. Further recommendations according to Mg result.  Orders: -     Magnesium; Future -     Magnesium Oxide; Take 1 tablet (400 mg total) by mouth daily. For 3 days  Dispense: 90 tablet; Refill: 3  Thrombocytopenia Mild. Further recommendations according  to CBC.  -     CBC; Future  Closed compression fracture of L1 lumbar vertebra with routine healing, subsequent encounter  Following with ortho. Pain has improved. Continue Tylenol 500 mg every 4-6 hours as needed for pain. Pending repeating MRI and discussed treatment options.  Hx of pulmonary emboli (HCC) Assessment & Plan: Recurrent, has not been compliant with oral anticoagulation. Currently he is back to Eliquis 5 mg twice daily. We discussed some side effects. Stressed the importance of taking medication as instructed. Follow-up in 2 months.  Orders: -     Apixaban; Take 1 tablet (5 mg total) by mouth 2 (two) times daily.  Dispense: 180 tablet; Refill: 2  Unstable gait Assessment & Plan: Fall precautions discussed. Continue PT and OT through [facility].  Rash and nonspecific skin eruption Assessment & Plan: We discussed possible etiologies, ? Stasis dermatitis. Recommend topical Triamcinolone cream bid for 14 days at the time and as needed. Keep areas clean  with soap and water.  Orders: -     Triamcinolone Acetonide; Apply 1 Application topically 2 (two) times daily.  Dispense: 30 g; Refill: 1  Alcohol dependence, uncomplicated Assessment & Plan: Recommend considering attending [facility] meetings. We discussed adverse effects of alcohol intake, strongly recommend continuing abstinence.   Recurrent major depressive disorder, in partial remission Assessment & Plan: Stable otherwise. Continue duloxetine 30 mg daily.  COPD mixed type Assessment & Plan: Stable. He is not on bronchodilators. He is not interested in smoking cessation. Follows with pulmonologist.  I spent a total of 50 minutes in both face to face and non face to face activities for this visit on the date of this encounter. During this time history was obtained and documented, examination was performed, prior labs/imaging reviewed, and assessment/plan discussed.  Return in about 2 months (around  08/31/2022).  Lara Palinkas G. [place], MD  [facility]. [place] office.

## 2022-07-01 ENCOUNTER — Ambulatory Visit (INDEPENDENT_AMBULATORY_CARE_PROVIDER_SITE_OTHER): Payer: Medicare PPO | Admitting: Family Medicine

## 2022-07-01 ENCOUNTER — Encounter: Payer: Self-pay | Admitting: Family Medicine

## 2022-07-01 VITALS — BP 128/80 | HR 100 | Resp 16 | Ht 68.0 in | Wt 162.1 lb

## 2022-07-01 DIAGNOSIS — D696 Thrombocytopenia, unspecified: Secondary | ICD-10-CM

## 2022-07-01 DIAGNOSIS — F1020 Alcohol dependence, uncomplicated: Secondary | ICD-10-CM

## 2022-07-01 DIAGNOSIS — I2699 Other pulmonary embolism without acute cor pulmonale: Secondary | ICD-10-CM | POA: Diagnosis not present

## 2022-07-01 DIAGNOSIS — S32010D Wedge compression fracture of first lumbar vertebra, subsequent encounter for fracture with routine healing: Secondary | ICD-10-CM

## 2022-07-01 DIAGNOSIS — J449 Chronic obstructive pulmonary disease, unspecified: Secondary | ICD-10-CM

## 2022-07-01 DIAGNOSIS — R2681 Unsteadiness on feet: Secondary | ICD-10-CM | POA: Diagnosis not present

## 2022-07-01 DIAGNOSIS — R21 Rash and other nonspecific skin eruption: Secondary | ICD-10-CM

## 2022-07-01 DIAGNOSIS — I10 Essential (primary) hypertension: Secondary | ICD-10-CM

## 2022-07-01 DIAGNOSIS — F3341 Major depressive disorder, recurrent, in partial remission: Secondary | ICD-10-CM | POA: Diagnosis not present

## 2022-07-01 MED ORDER — TRIAMCINOLONE ACETONIDE 0.1 % EX CREA: 1.0000 | TOPICAL_CREAM | Freq: Two times a day (BID) | CUTANEOUS | 1 refills | Status: DC

## 2022-07-01 NOTE — Assessment & Plan Note (Addendum)
Recurrent, has not been compliant with oral anticoagulation. Currently he is back to Eliquis 5 mg twice daily. We discussed some side effects. Stressed the importance of taking medication as instructed. Follow-up in 2 months.

## 2022-07-01 NOTE — Assessment & Plan Note (Signed)
Recommend considering attending AA meetings. We discussed adverse effects of alcohol intake, strongly recommend continuing abstinence.

## 2022-07-01 NOTE — Assessment & Plan Note (Signed)
Stable otherwise. Continue duloxetine 30 mg daily.

## 2022-07-01 NOTE — Assessment & Plan Note (Signed)
BP adequately controlled. Continue amlodipine 5 mg daily, carvedilol 6.25 mg twice daily, and he will be Sartain 150 mg daily. Continue monitoring BP regularly. Low-salt diet also recommended. Follow-up in 2 months.

## 2022-07-01 NOTE — Patient Instructions (Addendum)
A few things to remember from today's visit:  Essential hypertension - Plan: Basic metabolic panel  Hypomagnesemia - Plan: Magnesium  Thrombocytopenia - Plan: CBC  Hx of pulmonary emboli (HCC)  Unstable gait  No changes today. Fall precautions. No alcohol.  If you need refills for medications you take chronically, please call your pharmacy. Do not use My Chart to request refills or for acute issues that need immediate attention. If you send a my chart message, it may take a few days to be addressed, specially if I am not in the office.  Please be sure medication list is accurate. If a new problem present, please set up appointment sooner than planned today.

## 2022-07-01 NOTE — Assessment & Plan Note (Signed)
Fall precautions discussed. Continue PT and OT through [facility].

## 2022-07-02 DIAGNOSIS — J9601 Acute respiratory failure with hypoxia: Secondary | ICD-10-CM | POA: Diagnosis not present

## 2022-07-02 DIAGNOSIS — M1991 Primary osteoarthritis, unspecified site: Secondary | ICD-10-CM | POA: Diagnosis not present

## 2022-07-02 DIAGNOSIS — G8929 Other chronic pain: Secondary | ICD-10-CM | POA: Diagnosis not present

## 2022-07-02 DIAGNOSIS — I2510 Atherosclerotic heart disease of native coronary artery without angina pectoris: Secondary | ICD-10-CM | POA: Diagnosis not present

## 2022-07-02 DIAGNOSIS — E7800 Pure hypercholesterolemia, unspecified: Secondary | ICD-10-CM | POA: Diagnosis not present

## 2022-07-02 DIAGNOSIS — J449 Chronic obstructive pulmonary disease, unspecified: Secondary | ICD-10-CM | POA: Diagnosis not present

## 2022-07-02 DIAGNOSIS — S32018D Other fracture of first lumbar vertebra, subsequent encounter for fracture with routine healing: Secondary | ICD-10-CM | POA: Diagnosis not present

## 2022-07-02 DIAGNOSIS — K219 Gastro-esophageal reflux disease without esophagitis: Secondary | ICD-10-CM | POA: Diagnosis not present

## 2022-07-02 DIAGNOSIS — I10 Essential (primary) hypertension: Secondary | ICD-10-CM | POA: Diagnosis not present

## 2022-07-02 LAB — CBC
HCT: 43.8 % (ref 39.0–52.0)
Hemoglobin: 14.9 g/dL (ref 13.0–17.0)
MCHC: 34.2 g/dL (ref 30.0–36.0)
MCV: 92.9 fl (ref 78.0–100.0)
Platelets: 236 10*3/uL (ref 150.0–400.0)
RBC: 4.71 Mil/uL (ref 4.22–5.81)
RDW: 13.7 % (ref 11.5–15.5)
WBC: 6.1 10*3/uL (ref 4.0–10.5)

## 2022-07-02 LAB — BASIC METABOLIC PANEL
BUN: 12 mg/dL (ref 6–23)
CO2: 27 mEq/L (ref 19–32)
Calcium: 9.5 mg/dL (ref 8.4–10.5)
Chloride: 105 mEq/L (ref 96–112)
Creatinine, Ser: 0.95 mg/dL (ref 0.40–1.50)
GFR: 75.17 mL/min (ref >60.00)
Glucose, Bld: 100 mg/dL — ABNORMAL HIGH (ref 70–99)
Potassium: 4 mEq/L (ref 3.5–5.1)
Sodium: 141 mEq/L (ref 135–145)

## 2022-07-02 LAB — MAGNESIUM: Magnesium: 1.2 mg/dL — ABNORMAL LOW (ref 1.5–2.5)

## 2022-07-02 MED ORDER — APIXABAN 5 MG PO TABS: 5.0000 mg | ORAL_TABLET | Freq: Two times a day (BID) | ORAL | 2 refills | Status: DC

## 2022-07-02 MED ORDER — MAGNESIUM OXIDE 400 MG PO TABS: 400.0000 mg | ORAL_TABLET | Freq: Every day | ORAL | 3 refills | Status: DC

## 2022-07-02 MED ORDER — IRBESARTAN 150 MG PO TABS: 150.0000 mg | ORAL_TABLET | Freq: Every day | ORAL | 2 refills | Status: DC

## 2022-07-02 NOTE — Assessment & Plan Note (Signed)
We discussed possible etiologies, ? Stasis dermatitis. Recommend topical Triamcinolone cream bid for 14 days at the time and as needed. Keep areas clean with soap and water.

## 2022-07-02 NOTE — Assessment & Plan Note (Signed)
He is not on magnesium supplementation. We discussed possible complications. Further recommendations according to Mg result.

## 2022-07-02 NOTE — Assessment & Plan Note (Addendum)
Stable. He is not on bronchodilators. He is not interested in smoking cessation. Follows with pulmonologist.

## 2022-07-03 DIAGNOSIS — J449 Chronic obstructive pulmonary disease, unspecified: Secondary | ICD-10-CM | POA: Diagnosis not present

## 2022-07-03 DIAGNOSIS — I10 Essential (primary) hypertension: Secondary | ICD-10-CM | POA: Diagnosis not present

## 2022-07-06 ENCOUNTER — Telehealth: Payer: Self-pay | Admitting: Family Medicine

## 2022-07-06 DIAGNOSIS — G8929 Other chronic pain: Secondary | ICD-10-CM | POA: Diagnosis not present

## 2022-07-06 DIAGNOSIS — K219 Gastro-esophageal reflux disease without esophagitis: Secondary | ICD-10-CM | POA: Diagnosis not present

## 2022-07-06 DIAGNOSIS — J449 Chronic obstructive pulmonary disease, unspecified: Secondary | ICD-10-CM | POA: Diagnosis not present

## 2022-07-06 DIAGNOSIS — J9601 Acute respiratory failure with hypoxia: Secondary | ICD-10-CM | POA: Diagnosis not present

## 2022-07-06 DIAGNOSIS — M1991 Primary osteoarthritis, unspecified site: Secondary | ICD-10-CM | POA: Diagnosis not present

## 2022-07-06 DIAGNOSIS — I10 Essential (primary) hypertension: Secondary | ICD-10-CM | POA: Diagnosis not present

## 2022-07-06 DIAGNOSIS — S32018D Other fracture of first lumbar vertebra, subsequent encounter for fracture with routine healing: Secondary | ICD-10-CM | POA: Diagnosis not present

## 2022-07-06 DIAGNOSIS — E7800 Pure hypercholesterolemia, unspecified: Secondary | ICD-10-CM | POA: Diagnosis not present

## 2022-07-06 DIAGNOSIS — I2510 Atherosclerotic heart disease of native coronary artery without angina pectoris: Secondary | ICD-10-CM | POA: Diagnosis not present

## 2022-07-06 NOTE — Telephone Encounter (Signed)
Taylor ST with [facility] is calling and need VO 1x4

## 2022-07-08 DIAGNOSIS — J9601 Acute respiratory failure with hypoxia: Secondary | ICD-10-CM | POA: Diagnosis not present

## 2022-07-08 DIAGNOSIS — S32010A Wedge compression fracture of first lumbar vertebra, initial encounter for closed fracture: Secondary | ICD-10-CM | POA: Diagnosis not present

## 2022-07-08 DIAGNOSIS — S32000A Wedge compression fracture of unspecified lumbar vertebra, initial encounter for closed fracture: Secondary | ICD-10-CM | POA: Diagnosis not present

## 2022-07-08 DIAGNOSIS — S32018D Other fracture of first lumbar vertebra, subsequent encounter for fracture with routine healing: Secondary | ICD-10-CM | POA: Diagnosis not present

## 2022-07-08 DIAGNOSIS — M1991 Primary osteoarthritis, unspecified site: Secondary | ICD-10-CM | POA: Diagnosis not present

## 2022-07-08 DIAGNOSIS — K219 Gastro-esophageal reflux disease without esophagitis: Secondary | ICD-10-CM | POA: Diagnosis not present

## 2022-07-08 DIAGNOSIS — J449 Chronic obstructive pulmonary disease, unspecified: Secondary | ICD-10-CM | POA: Diagnosis not present

## 2022-07-08 DIAGNOSIS — E7800 Pure hypercholesterolemia, unspecified: Secondary | ICD-10-CM | POA: Diagnosis not present

## 2022-07-08 DIAGNOSIS — I10 Essential (primary) hypertension: Secondary | ICD-10-CM | POA: Diagnosis not present

## 2022-07-08 DIAGNOSIS — I2510 Atherosclerotic heart disease of native coronary artery without angina pectoris: Secondary | ICD-10-CM | POA: Diagnosis not present

## 2022-07-08 DIAGNOSIS — G8929 Other chronic pain: Secondary | ICD-10-CM | POA: Diagnosis not present

## 2022-07-08 NOTE — Telephone Encounter (Signed)
I left the VO on Taylor's voicemail.

## 2022-07-08 NOTE — Telephone Encounter (Signed)
He is okay to give verbal authorization for requested services. Thanks, BJ 

## 2022-07-13 ENCOUNTER — Other Ambulatory Visit: Payer: Self-pay | Admitting: Family Medicine

## 2022-07-13 DIAGNOSIS — E7800 Pure hypercholesterolemia, unspecified: Secondary | ICD-10-CM | POA: Diagnosis not present

## 2022-07-13 DIAGNOSIS — I10 Essential (primary) hypertension: Secondary | ICD-10-CM | POA: Diagnosis not present

## 2022-07-13 DIAGNOSIS — K219 Gastro-esophageal reflux disease without esophagitis: Secondary | ICD-10-CM | POA: Diagnosis not present

## 2022-07-13 DIAGNOSIS — J9601 Acute respiratory failure with hypoxia: Secondary | ICD-10-CM | POA: Diagnosis not present

## 2022-07-13 DIAGNOSIS — M1991 Primary osteoarthritis, unspecified site: Secondary | ICD-10-CM | POA: Diagnosis not present

## 2022-07-13 DIAGNOSIS — I2510 Atherosclerotic heart disease of native coronary artery without angina pectoris: Secondary | ICD-10-CM | POA: Diagnosis not present

## 2022-07-13 DIAGNOSIS — G8929 Other chronic pain: Secondary | ICD-10-CM | POA: Diagnosis not present

## 2022-07-13 DIAGNOSIS — I2699 Other pulmonary embolism without acute cor pulmonale: Secondary | ICD-10-CM

## 2022-07-13 DIAGNOSIS — J449 Chronic obstructive pulmonary disease, unspecified: Secondary | ICD-10-CM | POA: Diagnosis not present

## 2022-07-13 DIAGNOSIS — S32018D Other fracture of first lumbar vertebra, subsequent encounter for fracture with routine healing: Secondary | ICD-10-CM | POA: Diagnosis not present

## 2022-07-13 MED ORDER — VITAMIN B-1 100 MG PO TABS: 100.0000 mg | ORAL_TABLET | Freq: Every day | ORAL | 0 refills | Status: DC

## 2022-07-13 MED ORDER — FOLIC ACID 1 MG PO TABS: 1.0000 mg | ORAL_TABLET | Freq: Every day | ORAL | 2 refills | Status: DC

## 2022-07-13 MED ORDER — APIXABAN 5 MG PO TABS: 5.0000 mg | ORAL_TABLET | Freq: Two times a day (BID) | ORAL | 2 refills | Status: DC

## 2022-07-13 NOTE — Telephone Encounter (Signed)
Prescription Request  07/13/2022  LOV: 07/01/2022  What is the name of the medication or equipment? apixaban (ELIQUIS) 5 MG TABS tablet  requesting folic acid (FOLVITE) 1 MG tablet  and thiamine (VITAMIN B-1) 100 MG tablet if you feel he needs to continue  Have you contacted your pharmacy to request a refill? No   Which pharmacy would you like this sent to?  [facility] [id number] [place], [place] - [address] [facility] [address] [place] [address] Phone: [phone] Fax: [phone]     Patient notified that their request is being sent to the clinical staff for review and that they should receive a response within 2 business days.   Please advise at Mobile [phone] (mobile)

## 2022-07-14 DIAGNOSIS — E7800 Pure hypercholesterolemia, unspecified: Secondary | ICD-10-CM | POA: Diagnosis not present

## 2022-07-14 DIAGNOSIS — G8929 Other chronic pain: Secondary | ICD-10-CM | POA: Diagnosis not present

## 2022-07-14 DIAGNOSIS — J9601 Acute respiratory failure with hypoxia: Secondary | ICD-10-CM | POA: Diagnosis not present

## 2022-07-14 DIAGNOSIS — M1991 Primary osteoarthritis, unspecified site: Secondary | ICD-10-CM | POA: Diagnosis not present

## 2022-07-14 DIAGNOSIS — J449 Chronic obstructive pulmonary disease, unspecified: Secondary | ICD-10-CM | POA: Diagnosis not present

## 2022-07-14 DIAGNOSIS — I2510 Atherosclerotic heart disease of native coronary artery without angina pectoris: Secondary | ICD-10-CM | POA: Diagnosis not present

## 2022-07-14 DIAGNOSIS — I10 Essential (primary) hypertension: Secondary | ICD-10-CM | POA: Diagnosis not present

## 2022-07-14 DIAGNOSIS — K219 Gastro-esophageal reflux disease without esophagitis: Secondary | ICD-10-CM | POA: Diagnosis not present

## 2022-07-14 DIAGNOSIS — S32018D Other fracture of first lumbar vertebra, subsequent encounter for fracture with routine healing: Secondary | ICD-10-CM | POA: Diagnosis not present

## 2022-07-15 DIAGNOSIS — I2510 Atherosclerotic heart disease of native coronary artery without angina pectoris: Secondary | ICD-10-CM | POA: Diagnosis not present

## 2022-07-15 DIAGNOSIS — J449 Chronic obstructive pulmonary disease, unspecified: Secondary | ICD-10-CM | POA: Diagnosis not present

## 2022-07-15 DIAGNOSIS — M1991 Primary osteoarthritis, unspecified site: Secondary | ICD-10-CM | POA: Diagnosis not present

## 2022-07-15 DIAGNOSIS — E7800 Pure hypercholesterolemia, unspecified: Secondary | ICD-10-CM | POA: Diagnosis not present

## 2022-07-15 DIAGNOSIS — S32018D Other fracture of first lumbar vertebra, subsequent encounter for fracture with routine healing: Secondary | ICD-10-CM | POA: Diagnosis not present

## 2022-07-15 DIAGNOSIS — K219 Gastro-esophageal reflux disease without esophagitis: Secondary | ICD-10-CM | POA: Diagnosis not present

## 2022-07-15 DIAGNOSIS — J9601 Acute respiratory failure with hypoxia: Secondary | ICD-10-CM | POA: Diagnosis not present

## 2022-07-15 DIAGNOSIS — G8929 Other chronic pain: Secondary | ICD-10-CM | POA: Diagnosis not present

## 2022-07-15 DIAGNOSIS — I10 Essential (primary) hypertension: Secondary | ICD-10-CM | POA: Diagnosis not present

## 2022-07-20 DIAGNOSIS — I10 Essential (primary) hypertension: Secondary | ICD-10-CM | POA: Diagnosis not present

## 2022-07-20 DIAGNOSIS — J449 Chronic obstructive pulmonary disease, unspecified: Secondary | ICD-10-CM | POA: Diagnosis not present

## 2022-07-21 DIAGNOSIS — S32000A Wedge compression fracture of unspecified lumbar vertebra, initial encounter for closed fracture: Secondary | ICD-10-CM | POA: Diagnosis not present

## 2022-07-22 DIAGNOSIS — J449 Chronic obstructive pulmonary disease, unspecified: Secondary | ICD-10-CM | POA: Diagnosis not present

## 2022-07-22 DIAGNOSIS — G8929 Other chronic pain: Secondary | ICD-10-CM | POA: Diagnosis not present

## 2022-07-22 DIAGNOSIS — E7800 Pure hypercholesterolemia, unspecified: Secondary | ICD-10-CM | POA: Diagnosis not present

## 2022-07-22 DIAGNOSIS — J9601 Acute respiratory failure with hypoxia: Secondary | ICD-10-CM | POA: Diagnosis not present

## 2022-07-22 DIAGNOSIS — K219 Gastro-esophageal reflux disease without esophagitis: Secondary | ICD-10-CM | POA: Diagnosis not present

## 2022-07-22 DIAGNOSIS — S32018D Other fracture of first lumbar vertebra, subsequent encounter for fracture with routine healing: Secondary | ICD-10-CM | POA: Diagnosis not present

## 2022-07-22 DIAGNOSIS — I2510 Atherosclerotic heart disease of native coronary artery without angina pectoris: Secondary | ICD-10-CM | POA: Diagnosis not present

## 2022-07-22 DIAGNOSIS — I10 Essential (primary) hypertension: Secondary | ICD-10-CM | POA: Diagnosis not present

## 2022-07-22 DIAGNOSIS — M1991 Primary osteoarthritis, unspecified site: Secondary | ICD-10-CM | POA: Diagnosis not present

## 2022-07-23 DIAGNOSIS — E7800 Pure hypercholesterolemia, unspecified: Secondary | ICD-10-CM | POA: Diagnosis not present

## 2022-07-23 DIAGNOSIS — K219 Gastro-esophageal reflux disease without esophagitis: Secondary | ICD-10-CM | POA: Diagnosis not present

## 2022-07-23 DIAGNOSIS — J449 Chronic obstructive pulmonary disease, unspecified: Secondary | ICD-10-CM | POA: Diagnosis not present

## 2022-07-23 DIAGNOSIS — I2510 Atherosclerotic heart disease of native coronary artery without angina pectoris: Secondary | ICD-10-CM | POA: Diagnosis not present

## 2022-07-23 DIAGNOSIS — M1991 Primary osteoarthritis, unspecified site: Secondary | ICD-10-CM | POA: Diagnosis not present

## 2022-07-23 DIAGNOSIS — S32018D Other fracture of first lumbar vertebra, subsequent encounter for fracture with routine healing: Secondary | ICD-10-CM | POA: Diagnosis not present

## 2022-07-23 DIAGNOSIS — G8929 Other chronic pain: Secondary | ICD-10-CM | POA: Diagnosis not present

## 2022-07-23 DIAGNOSIS — J9601 Acute respiratory failure with hypoxia: Secondary | ICD-10-CM | POA: Diagnosis not present

## 2022-07-23 DIAGNOSIS — I10 Essential (primary) hypertension: Secondary | ICD-10-CM | POA: Diagnosis not present

## 2022-07-27 DIAGNOSIS — E7800 Pure hypercholesterolemia, unspecified: Secondary | ICD-10-CM | POA: Diagnosis not present

## 2022-07-27 DIAGNOSIS — G8929 Other chronic pain: Secondary | ICD-10-CM | POA: Diagnosis not present

## 2022-07-27 DIAGNOSIS — I10 Essential (primary) hypertension: Secondary | ICD-10-CM | POA: Diagnosis not present

## 2022-07-27 DIAGNOSIS — J449 Chronic obstructive pulmonary disease, unspecified: Secondary | ICD-10-CM | POA: Diagnosis not present

## 2022-07-27 DIAGNOSIS — M1991 Primary osteoarthritis, unspecified site: Secondary | ICD-10-CM | POA: Diagnosis not present

## 2022-07-27 DIAGNOSIS — S32018D Other fracture of first lumbar vertebra, subsequent encounter for fracture with routine healing: Secondary | ICD-10-CM | POA: Diagnosis not present

## 2022-07-27 DIAGNOSIS — K219 Gastro-esophageal reflux disease without esophagitis: Secondary | ICD-10-CM | POA: Diagnosis not present

## 2022-07-27 DIAGNOSIS — I2510 Atherosclerotic heart disease of native coronary artery without angina pectoris: Secondary | ICD-10-CM | POA: Diagnosis not present

## 2022-07-27 DIAGNOSIS — J9601 Acute respiratory failure with hypoxia: Secondary | ICD-10-CM | POA: Diagnosis not present

## 2022-07-28 DIAGNOSIS — E7800 Pure hypercholesterolemia, unspecified: Secondary | ICD-10-CM | POA: Diagnosis not present

## 2022-07-28 DIAGNOSIS — J449 Chronic obstructive pulmonary disease, unspecified: Secondary | ICD-10-CM | POA: Diagnosis not present

## 2022-07-28 DIAGNOSIS — S32018D Other fracture of first lumbar vertebra, subsequent encounter for fracture with routine healing: Secondary | ICD-10-CM | POA: Diagnosis not present

## 2022-07-28 DIAGNOSIS — G8929 Other chronic pain: Secondary | ICD-10-CM | POA: Diagnosis not present

## 2022-07-28 DIAGNOSIS — I2510 Atherosclerotic heart disease of native coronary artery without angina pectoris: Secondary | ICD-10-CM | POA: Diagnosis not present

## 2022-07-28 DIAGNOSIS — I10 Essential (primary) hypertension: Secondary | ICD-10-CM | POA: Diagnosis not present

## 2022-07-28 DIAGNOSIS — M1991 Primary osteoarthritis, unspecified site: Secondary | ICD-10-CM | POA: Diagnosis not present

## 2022-07-28 DIAGNOSIS — K219 Gastro-esophageal reflux disease without esophagitis: Secondary | ICD-10-CM | POA: Diagnosis not present

## 2022-07-28 DIAGNOSIS — J9601 Acute respiratory failure with hypoxia: Secondary | ICD-10-CM | POA: Diagnosis not present

## 2022-08-03 DIAGNOSIS — E7800 Pure hypercholesterolemia, unspecified: Secondary | ICD-10-CM | POA: Diagnosis not present

## 2022-08-03 DIAGNOSIS — K219 Gastro-esophageal reflux disease without esophagitis: Secondary | ICD-10-CM | POA: Diagnosis not present

## 2022-08-03 DIAGNOSIS — J449 Chronic obstructive pulmonary disease, unspecified: Secondary | ICD-10-CM | POA: Diagnosis not present

## 2022-08-03 DIAGNOSIS — M1991 Primary osteoarthritis, unspecified site: Secondary | ICD-10-CM | POA: Diagnosis not present

## 2022-08-03 DIAGNOSIS — S32018D Other fracture of first lumbar vertebra, subsequent encounter for fracture with routine healing: Secondary | ICD-10-CM | POA: Diagnosis not present

## 2022-08-03 DIAGNOSIS — J9601 Acute respiratory failure with hypoxia: Secondary | ICD-10-CM | POA: Diagnosis not present

## 2022-08-03 DIAGNOSIS — I10 Essential (primary) hypertension: Secondary | ICD-10-CM | POA: Diagnosis not present

## 2022-08-03 DIAGNOSIS — G8929 Other chronic pain: Secondary | ICD-10-CM | POA: Diagnosis not present

## 2022-08-03 DIAGNOSIS — I2510 Atherosclerotic heart disease of native coronary artery without angina pectoris: Secondary | ICD-10-CM | POA: Diagnosis not present

## 2022-08-07 DIAGNOSIS — S32018D Other fracture of first lumbar vertebra, subsequent encounter for fracture with routine healing: Secondary | ICD-10-CM | POA: Diagnosis not present

## 2022-08-07 DIAGNOSIS — J449 Chronic obstructive pulmonary disease, unspecified: Secondary | ICD-10-CM | POA: Diagnosis not present

## 2022-08-07 DIAGNOSIS — I10 Essential (primary) hypertension: Secondary | ICD-10-CM | POA: Diagnosis not present

## 2022-08-07 DIAGNOSIS — K219 Gastro-esophageal reflux disease without esophagitis: Secondary | ICD-10-CM | POA: Diagnosis not present

## 2022-08-07 DIAGNOSIS — G8929 Other chronic pain: Secondary | ICD-10-CM | POA: Diagnosis not present

## 2022-08-07 DIAGNOSIS — M1991 Primary osteoarthritis, unspecified site: Secondary | ICD-10-CM | POA: Diagnosis not present

## 2022-08-07 DIAGNOSIS — I2510 Atherosclerotic heart disease of native coronary artery without angina pectoris: Secondary | ICD-10-CM | POA: Diagnosis not present

## 2022-08-07 DIAGNOSIS — J9601 Acute respiratory failure with hypoxia: Secondary | ICD-10-CM | POA: Diagnosis not present

## 2022-08-07 DIAGNOSIS — E7800 Pure hypercholesterolemia, unspecified: Secondary | ICD-10-CM | POA: Diagnosis not present

## 2022-08-17 DIAGNOSIS — Z961 Presence of intraocular lens: Secondary | ICD-10-CM | POA: Diagnosis not present

## 2022-08-17 DIAGNOSIS — H52203 Unspecified astigmatism, bilateral: Secondary | ICD-10-CM | POA: Diagnosis not present

## 2022-08-17 DIAGNOSIS — H353132 Nonexudative age-related macular degeneration, bilateral, intermediate dry stage: Secondary | ICD-10-CM | POA: Diagnosis not present

## 2022-08-21 DIAGNOSIS — I10 Essential (primary) hypertension: Secondary | ICD-10-CM | POA: Diagnosis not present

## 2022-08-21 DIAGNOSIS — J449 Chronic obstructive pulmonary disease, unspecified: Secondary | ICD-10-CM | POA: Diagnosis not present

## 2022-08-28 NOTE — Progress Notes (Unsigned)
HPI: [name] is a 82 y.o. male, who is here today for chronic disease management.  Last seen on 07/01/22  *** Review of Systems See other pertinent positives and negatives in HPI.  Current Outpatient Medications on File Prior to Visit  Medication Sig Dispense Refill   Acetaminophen 500 MG capsule Take 1,000 mg by mouth daily as needed for fever.     amLODipine (NORVASC) 5 MG tablet Take 1 tablet (5 mg total) by mouth daily. 90 tablet 1   apixaban (ELIQUIS) 5 MG TABS tablet Take 1 tablet (5 mg total) by mouth 2 (two) times daily. 180 tablet 2   carvedilol (COREG) 6.25 MG tablet Take 1 tablet (6.25 mg total) by mouth 2 (two) times daily. 180 tablet 1   DULoxetine (CYMBALTA) 30 MG capsule TAKE ONE CAPSULE BY MOUTH DAILY 90 capsule 2   EPINEPHrine (EPIPEN 2-PAK) 0.3 mg/0.3 mL IJ SOAJ injection To use daily if needed for severe allergy reaction. 1 each 0   folic acid (FOLVITE) 1 MG tablet Take 1 tablet (1 mg total) by mouth daily. 90 tablet 2   furosemide (LASIX) 20 MG tablet Take 1 tablet (20 mg total) by mouth daily as needed (for weight increase of 3 lbs overnight or 5 lbs in 1 week). 30 tablet 3   irbesartan (AVAPRO) 150 MG tablet Take 1 tablet (150 mg total) by mouth daily. 90 tablet 2   magnesium oxide (MAG-OX) 400 MG tablet Take 1 tablet (400 mg total) by mouth daily. For 3 days 90 tablet 3   nicotine (NICODERM CQ - DOSED IN MG/24 HOURS) 21 mg/24hr patch Place 1 patch (21 mg total) onto the skin daily. 28 patch 0   omeprazole (PRILOSEC) 20 MG capsule TAKE ONE CAPSULE BY MOUTH TWICE A DAY BEFORE MEALS (Patient taking differently: Take 20 mg by mouth daily.) 180 capsule 1   potassium chloride (KLOR-CON) 10 MEQ tablet Take 1 tablet (10 mEq total) by mouth daily as needed (with lasix). 30 tablet 3   rosuvastatin (CRESTOR) 20 MG tablet TAKE ONE TABLET BY MOUTH DAILY 90 tablet 2   thiamine (VITAMIN B-1) 100 MG tablet Take 1 tablet (100 mg total) by mouth daily. 30 tablet 0    tiZANidine (ZANAFLEX) 2 MG tablet TAKE ONE TABLET BY MOUTH TWICE A DAY AS NEEDED 60 tablet 3   triamcinolone cream (KENALOG) 0.1 % Apply 1 Application topically 2 (two) times daily. 30 g 1   No current facility-administered medications on file prior to visit.    Past Medical History:  Diagnosis Date   Adenocarcinoma of lung, stage 1 (HCC)    Anemia    Angiodysplasia of cecum 11/23/2017   Multiple 1-2 mm   Arthritis    back possibly   Blood clotting disorder (HCC)    Cataract    removed bilaterally    Chronic obstructive pulmonary disease (COPD) (HCC)    "patient not aware"   Clotting disorder (HCC)    PE LUNG, dvt leg    Colon polyps    Depression    Diverticulosis    DVT (deep venous thrombosis) (HCC)    Post op  knee surgery   DVT (deep venous thrombosis) (HCC)    GERD (gastroesophageal reflux disease)    Histoplasmosis    Histoplasmosis with pneumonia (HCC) 04/01/2015   Hypercholesteremia    Hyperlipidemia    Hypertension    Laceration of head 08/05/2015   Lumbar compression fracture (HCC)    Lung  cancer [facility]) dx'd 2016   Personal history of colonic polyps 2008   multiple adenomas 2008-2009 exams with large cecal adenoma   Pneumonia    in history   Polyposis coli - attenuated 09/12/2010   2008: adenomas with 9 polyps index and 2.5 cm cecal polyp removed over 3 exams, last 2009 with APC. diverticulosis also. 11/24/2010: 12 polyps removed - adenomas Anticipate routine repeat colonoscopy Spring 2013 Have recommended he see genetics counselor re ? Polyposis 03/27/2016 14 polyps removed max 15 mm    Pulmonary nodule 06/2006   9 mm right upper obe pulmonary nodule (negative Pet Scan)   PVD (peripheral vascular disease) (HCC)    left leg s/p stent   Sebaceous cyst    neck   Seborrheic keratosis    Seizures (HCC)    4-5 yrs ago had seizures when in ICU- none since this time    Sinusitis    Tobacco abuse    Tremor    Allergies  Allergen Reactions   Codeine Anaphylaxis    Hydrocodone Itching    Social History   Socioeconomic History   Marital status: Widowed    Spouse name: Not on file   Number of children: 2   Years of education: Not on file   Highest education level: Not on file  Occupational History   Occupation: Retired    Associate Professor: RETIRED  Tobacco Use   Smoking status: Every Day    Packs/day: 1.00    Years: 60.00    Additional pack years: 0.00    Total pack years: 60.00    Types: Cigarettes   Smokeless tobacco: Never  Vaping Use   Vaping Use: Never used  Substance and Sexual Activity   Alcohol use: Yes    Comment: beer on occasion   Drug use: No   Sexual activity: Not Currently  Other Topics Concern   Not on file  Social History Narrative       Married for over 50 years.  Wife died from lung cancer.     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     Occasional alcohol.      Tobacco use - over 50 pack years.   Father died at age 57 of suicide.   [facility]-daughter emergenc   y contact   Social Determinants of Health   Financial Resource Strain: Low Risk  (11/03/2021)   Overall Financial Resource Strain (CARDIA)    Difficulty of Paying Living Expenses: Not hard at all  Food Insecurity: No Food Insecurity (05/10/2022)   Hunger Vital Sign    Worried About Running Out of Food in the Last Year: Never true    Ran Out of Food in the Last Year: Never true  Transportation Needs: No Transportation Needs (05/10/2022)   PRAPARE - Administrator, Civil Service (Medical): No    Lack of Transportation (Non-Medical): No  Physical Activity: Inactive (11/03/2021)   Exercise Vital Sign    Days of Exercise per Week: 0 days    Minutes of Exercise per Session: 0 min  Stress: Stress Concern Present (11/03/2021)   [employer] of Occupational Health - Occupational Stress Questionnaire    Feeling of Stress : To some extent  Social Connections: Socially Isolated (05/08/2020)   Social Connection and  Isolation Panel [NHANES]    Frequency of Communication with Friends and Family: More than three times a week    Frequency of Social Gatherings with Friends and  Family: More than three times a week    Attends Religious Services: Never    Active Member of Clubs or Organizations: No    Attends Banker Meetings: Never    Marital Status: Widowed    There were no vitals filed for this visit. There is no height or weight on file to calculate BMI.  Physical Exam  ASSESSMENT AND PLAN:  There are no diagnoses linked to this encounter.  No orders of the defined types were placed in this encounter.   No problem-specific Assessment & Plan notes found for this encounter.   No follow-ups on file.  Aadyn Buchheit G. [place], MD  [facility]. [place] office.

## 2022-08-31 ENCOUNTER — Encounter: Payer: Self-pay | Admitting: Family Medicine

## 2022-08-31 ENCOUNTER — Ambulatory Visit (INDEPENDENT_AMBULATORY_CARE_PROVIDER_SITE_OTHER): Payer: Medicare PPO | Admitting: Family Medicine

## 2022-08-31 DIAGNOSIS — I10 Essential (primary) hypertension: Secondary | ICD-10-CM

## 2022-08-31 DIAGNOSIS — S32010D Wedge compression fracture of first lumbar vertebra, subsequent encounter for fracture with routine healing: Secondary | ICD-10-CM | POA: Diagnosis not present

## 2022-08-31 DIAGNOSIS — I2699 Other pulmonary embolism without acute cor pulmonale: Secondary | ICD-10-CM | POA: Diagnosis not present

## 2022-08-31 MED ORDER — MAGNESIUM OXIDE 400 MG PO TABS: 400.0000 mg | ORAL_TABLET | Freq: Every day | ORAL | 2 refills | Status: AC

## 2022-08-31 NOTE — Patient Instructions (Addendum)
A few things to remember from today's visit:  Essential hypertension  Hx of pulmonary emboli (HCC)  Hypomagnesemia  Closed compression fracture of L1 lumbar vertebra with routine healing, subsequent encounter  Please call to re-schedule appt with neurosurgeon. Start taking magnesium supplementation. No other changes today.  If you need refills for medications you take chronically, please call your pharmacy. Do not use My Chart to request refills or for acute issues that need immediate attention. If you send a my chart message, it may take a few days to be addressed, specially if I am not in the office.  Please be sure medication list is accurate. If a new problem present, please set up appointment sooner than planned today.

## 2022-08-31 NOTE — Assessment & Plan Note (Signed)
Magnesium oxide 400 mg daily recommended, prescription is at his pharmacy. We will plan on rechecking magnesium next visit.

## 2022-08-31 NOTE — Assessment & Plan Note (Signed)
Pain gradually improving and closer to his baseline. Currently he is following with neurosurgeon, missed last appointment with Dr. Wynetta Emery. He is planning on calling and rescheduling appointment. Fall precautions discussed.

## 2022-08-31 NOTE — Assessment & Plan Note (Addendum)
BP adequately controlled. Continue amlodipine 5 mg daily, carvedilol 6.25 mg twice daily, and Irbesartan 150 mg daily. Continue monitoring BP regularly. Low-salt diet recommended. Follow-up in 5 to 6 months, before if needed.

## 2022-08-31 NOTE — Assessment & Plan Note (Signed)
Continue Eliquis 5 mg twice daily, which he has been taking daily since his last visit.

## 2022-09-14 ENCOUNTER — Other Ambulatory Visit: Payer: Self-pay | Admitting: Family Medicine

## 2022-09-14 DIAGNOSIS — E785 Hyperlipidemia, unspecified: Secondary | ICD-10-CM

## 2022-09-25 DIAGNOSIS — J449 Chronic obstructive pulmonary disease, unspecified: Secondary | ICD-10-CM | POA: Diagnosis not present

## 2022-09-25 DIAGNOSIS — I10 Essential (primary) hypertension: Secondary | ICD-10-CM | POA: Diagnosis not present

## 2022-09-28 ENCOUNTER — Other Ambulatory Visit: Payer: Self-pay | Admitting: Family Medicine

## 2022-09-28 DIAGNOSIS — G8929 Other chronic pain: Secondary | ICD-10-CM

## 2022-10-22 DIAGNOSIS — S32000A Wedge compression fracture of unspecified lumbar vertebra, initial encounter for closed fracture: Secondary | ICD-10-CM | POA: Diagnosis not present

## 2022-12-11 ENCOUNTER — Other Ambulatory Visit: Payer: Self-pay

## 2022-12-11 MED ORDER — AMLODIPINE BESYLATE 5 MG PO TABS: 5.0000 mg | ORAL_TABLET | Freq: Every day | ORAL | 1 refills | Status: DC

## 2023-01-16 ENCOUNTER — Other Ambulatory Visit: Payer: Self-pay | Admitting: Family Medicine

## 2023-01-16 DIAGNOSIS — K219 Gastro-esophageal reflux disease without esophagitis: Secondary | ICD-10-CM

## 2023-03-02 ENCOUNTER — Ambulatory Visit: Payer: Medicare HMO | Admitting: Family Medicine

## 2023-04-14 ENCOUNTER — Encounter: Payer: Self-pay | Admitting: Family Medicine

## 2023-04-14 ENCOUNTER — Ambulatory Visit (INDEPENDENT_AMBULATORY_CARE_PROVIDER_SITE_OTHER): Payer: Medicare HMO | Admitting: Family Medicine

## 2023-04-14 VITALS — BP 132/80 | HR 86 | Temp 97.3°F | Resp 16 | Ht 68.0 in | Wt 160.5 lb

## 2023-04-14 DIAGNOSIS — F3341 Major depressive disorder, recurrent, in partial remission: Secondary | ICD-10-CM | POA: Diagnosis not present

## 2023-04-14 DIAGNOSIS — I10 Essential (primary) hypertension: Secondary | ICD-10-CM | POA: Diagnosis not present

## 2023-04-14 DIAGNOSIS — G8929 Other chronic pain: Secondary | ICD-10-CM

## 2023-04-14 DIAGNOSIS — E782 Mixed hyperlipidemia: Secondary | ICD-10-CM

## 2023-04-14 DIAGNOSIS — F1020 Alcohol dependence, uncomplicated: Secondary | ICD-10-CM

## 2023-04-14 DIAGNOSIS — C3490 Malignant neoplasm of unspecified part of unspecified bronchus or lung: Secondary | ICD-10-CM

## 2023-04-14 DIAGNOSIS — C3412 Malignant neoplasm of upper lobe, left bronchus or lung: Secondary | ICD-10-CM

## 2023-04-14 DIAGNOSIS — R8290 Unspecified abnormal findings in urine: Secondary | ICD-10-CM

## 2023-04-14 DIAGNOSIS — M545 Low back pain, unspecified: Secondary | ICD-10-CM

## 2023-04-14 DIAGNOSIS — Z0000 Encounter for general adult medical examination without abnormal findings: Secondary | ICD-10-CM

## 2023-04-14 LAB — COMPREHENSIVE METABOLIC PANEL
ALT: 8 U/L (ref 0–53)
AST: 12 U/L (ref 0–37)
Albumin: 4.2 g/dL (ref 3.5–5.2)
Alkaline Phosphatase: 91 U/L (ref 39–117)
BUN: 20 mg/dL (ref 6–23)
CO2: 31 meq/L (ref 19–32)
Calcium: 9 mg/dL (ref 8.4–10.5)
Chloride: 104 meq/L (ref 96–112)
Creatinine, Ser: 1.01 mg/dL (ref 0.40–1.50)
GFR: 69.46 mL/min (ref >60.00)
Glucose, Bld: 100 mg/dL — ABNORMAL HIGH (ref 70–99)
Potassium: 4.2 meq/L (ref 3.5–5.1)
Sodium: 141 meq/L (ref 135–145)
Total Bilirubin: 0.7 mg/dL (ref 0.2–1.2)
Total Protein: 6.8 g/dL (ref 6.0–8.3)

## 2023-04-14 LAB — URINALYSIS, ROUTINE W REFLEX MICROSCOPIC
Bilirubin Urine: NEGATIVE
Hgb urine dipstick: NEGATIVE
Ketones, ur: NEGATIVE
Leukocytes,Ua: NEGATIVE
Nitrite: NEGATIVE
Specific Gravity, Urine: 1.025 (ref 1.000–1.030)
Urine Glucose: NEGATIVE
Urobilinogen, UA: 0.2 (ref 0.0–1.0)
pH: 6 (ref 5.0–8.0)

## 2023-04-14 LAB — LIPID PANEL
Cholesterol: 134 mg/dL (ref 0–200)
HDL: 45.8 mg/dL (ref >39.00)
LDL Cholesterol: 62 mg/dL (ref 0–99)
NonHDL: 88.52
Total CHOL/HDL Ratio: 3
Triglycerides: 131 mg/dL (ref 0.0–149.0)
VLDL: 26.2 mg/dL (ref 0.0–40.0)

## 2023-04-14 LAB — CBC
HCT: 46.6 % (ref 39.0–52.0)
Hemoglobin: 15.9 g/dL (ref 13.0–17.0)
MCHC: 34 g/dL (ref 30.0–36.0)
MCV: 94.3 fL (ref 78.0–100.0)
Platelets: 238 10*3/uL (ref 150.0–400.0)
RBC: 4.95 Mil/uL (ref 4.22–5.81)
RDW: 14.4 % (ref 11.5–15.5)
WBC: 6.5 10*3/uL (ref 4.0–10.5)

## 2023-04-14 LAB — TSH: TSH: 2.85 u[IU]/mL (ref 0.35–5.50)

## 2023-04-14 LAB — MAGNESIUM: Magnesium: 1.8 mg/dL (ref 1.5–2.5)

## 2023-04-14 MED ORDER — DULOXETINE HCL 60 MG PO CPEP: 60.0000 mg | ORAL_CAPSULE | Freq: Every day | ORAL | 2 refills | Status: AC

## 2023-04-14 NOTE — Assessment & Plan Note (Signed)
Continue Rosuvastatin 20 mg daily and low fat diet. Further recommendations according to FLP result.

## 2023-04-14 NOTE — Assessment & Plan Note (Signed)
He is not interested in attending AA meetings, has done it before and did not help. We discussed adverse effects of alcohol intake, strongly recommend decreasing consumption.

## 2023-04-14 NOTE — Assessment & Plan Note (Signed)
Overall BP is adequately controlled, reporting similar readings at home. Continue amlodipine 5 mg daily, carvedilol 6.25 mg twice daily, and Irbesartan 150 mg daily. Low-salt diet recommended. Follow-up in 5 to 6 months.

## 2023-04-14 NOTE — Assessment & Plan Note (Signed)
Continue current dose of magnesium supplementation. Further recommendation will be given according to magnesium result.

## 2023-04-14 NOTE — Patient Instructions (Addendum)
A few things to remember from today's visit:  Essential hypertension - Plan: Comprehensive metabolic panel, TSH, CBC  Routine general medical examination at a health care facility  Malignant neoplasm of unspecified part of unspecified bronchus or lung (HCC), Chronic  COPD mixed type (HCC), Chronic  Hyperlipemia, mixed - Plan: Lipid panel  Bad odor of urine - Plan: Urinalysis, Routine w reflex microscopic  Chronic low back pain without sciatica, unspecified back pain laterality - Plan: Ambulatory referral to Pain Clinic  Hypomagnesemia - Plan: Magnesium  Duloxetine dose increased from 30 mg to 60 mg. Check meds at home to be sure list is accurate. Try to decrease alcohol intake.  If you need refills for medications you take chronically, please call your pharmacy. Do not use My Chart to request refills or for acute issues that need immediate attention. If you send a my chart message, it may take a few days to be addressed, specially if I am not in the office.  Please be sure medication list is accurate. If a new problem present, please set up appointment sooner than planned today.

## 2023-04-14 NOTE — Assessment & Plan Note (Signed)
Problem is getting worse, Zanaflex is not helping. He agrees with referral to physical medicine rehab for pain management.

## 2023-04-14 NOTE — Assessment & Plan Note (Signed)
Usually more symptomatic during the holidays and gradually improving.

## 2023-04-14 NOTE — Progress Notes (Signed)
HPI: [name] is a 83 y.o.male with a PMHx significant for HTN, depression, HLD, CAD, recurrent PE, adenocarcinoma of the lung-s/p VATS/wedge resection of right middle/upper lobe-2016, and tobacco abuse, who is here today for his routine physical examination.  Last CPE: 10/13/2021  Exercise: Patient states he has not been very active lately because of his back pain.  Diet: He cooks at home with a lot of salt.  Sleep: 5-6 hours per night.  Alcohol Use: He drinks a half pint of Brandy once every 1-2 weeks.  Smoking: still smoking Vision: UTD on routine vision care.  Dental: UTD on routine dental care.   Immunization History  Administered Date(s) Administered   PFIZER(Purple Top)SARS-COV-2 Vaccination 11/18/2019, 12/14/2019   Pneumococcal Conjugate-13 02/06/2013   Pneumococcal Polysaccharide-23 08/01/2012   Td 04/02/2009, 09/25/2011   Tdap 09/09/2017   Zoster, Live 02/06/2013   Health Maintenance  Topic Date Due   Medicare Annual Wellness (AWV)  11/04/2022   COVID-19 Vaccine (3 - Pfizer risk series) 04/30/2023 (Originally 01/11/2020)   Zoster Vaccines- Shingrix (1 of 2) 03/21/2026 (Originally 03/12/1960)   DTaP/Tdap/Td (4 - Td or Tdap) 09/10/2027   Pneumonia Vaccine 85+ Years old  Completed   HPV VACCINES  Aged Out   INFLUENZA VACCINE  Discontinued   Colonoscopy  Discontinued   Hepatitis C Screening  Discontinued   Chronic medical problems:   Hypertension: Currently on amlodipine 5 mg daily, carvedilol 6.25 mg twice daily, and irbesartan 150 mg daily. He also takes furosemide 20 mg daily for LE edema. BP readings at home: He has been checking his BP at home and says his readings are good.  Side effects: none Last saw cardiology 04/2022 Negative for unusual or severe headache, visual changes, exertional chest pain, dyspnea, focal weakness, or worsening edema.  Lab Results  Component Value Date   CREATININE 0.95 07/01/2022   BUN 12 07/01/2022   NA 141 07/01/2022    K 4.0 07/01/2022   CL 105 07/01/2022   CO2 27 07/01/2022   Hx of pulmonary embolism and DVT:  Currently on Eliquis 5 mg twice daily. Denies any blood in stool.  He is taking medication daily.  Lab Results  Component Value Date   WBC 6.1 07/01/2022   HGB 14.9 07/01/2022   HCT 43.8 07/01/2022   MCV 92.9 07/01/2022   PLT 236.0 07/01/2022   Hyperlipidemia: Currently on rosuvastatin 20 mg daily.   Lab Results  Component Value Date   CHOL 130 04/20/2022   HDL 36 (L) 04/20/2022   LDLCALC 59 04/20/2022   LDLDIRECT 79.0 10/13/2021   TRIG 216 (H) 04/20/2022   CHOLHDL 3.6 04/20/2022   Back Pain: Says he is still having back pain.  Currently on duloxetine 30 mg daily.  He didn't think Tizanidine 2 mg helped much.  He is receptive to seeing physical rehabilitation.   Depression:  Usually worse around the holidays when he remembers his wife and son's death. He says he didn't leave the house for about a month around Christmas.  His daughter calls him 3-4 times per week and comes over every Thursday for lunch.  He goes fishing with his friends once per year, which he enjoys.     04/14/2023   12:52 PM 08/31/2022    4:20 PM 11/03/2021   11:41 AM 10/13/2021    2:22 PM 02/14/2021    9:59 AM  Depression screen PHQ 2/9  Decreased Interest 3 2 1 3 3   Down, Depressed, Hopeless 1 2  2 1 1   PHQ - 2 Score 4 4 3 4 4   Altered sleeping 1 3 1 3 3   Tired, decreased energy 1 1 0 3 3  Change in appetite 1 0 0 0 0  Feeling bad or failure about yourself  1 0 0 1 1  Trouble concentrating 3 0 0 0 1  Moving slowly or fidgety/restless 3 0 0 0 0  Suicidal thoughts 0 0 0 0 0  PHQ-9 Score 14 8 4 11 12   Difficult doing work/chores Very difficult Somewhat difficult Not difficult at all Somewhat difficult       04/14/2023   12:52 PM 08/31/2022    4:20 PM 10/16/2019   10:52 AM  GAD 7 : Generalized Anxiety Score  Nervous, Anxious, on Edge 0 0 0  Control/stop worrying 0 0 0  Worry too much - different  things 0 0 0  Trouble relaxing 2 0 0  Restless 0 0 0  Easily annoyed or irritable 0 0 0  Afraid - awful might happen 0 0 0  Total GAD 7 Score 2 0 0  Anxiety Difficulty Extremely difficult Not difficult at all Not difficult at all   GERD:  Currently on omeprazole 20 mg daily.   He is still taking potassium chloride 10 mEq and thiamine 100 mg daily.  Mg++ 1.2 in 06/2022, taking magnesium supplementation.  He mentions he has had some odor in his urine and some increased frequency. Denies burning sensation or hematuria.   He says he hasn't fallen since his back injury in 03/2022.   Review of Systems  Constitutional:  Positive for fatigue. Negative for activity change, appetite change and fever.  HENT:  Negative for nosebleeds, sore throat and trouble swallowing.   Eyes:  Negative for redness and visual disturbance.  Respiratory:  Negative for cough and wheezing.   Gastrointestinal:  Negative for abdominal pain, nausea and vomiting.  Endocrine: Negative for cold intolerance, heat intolerance, polydipsia, polyphagia and polyuria.  Genitourinary:  Negative for decreased urine volume, dysuria, genital sores, hematuria and testicular pain.  Musculoskeletal:  Positive for arthralgias, back pain and gait problem.  Skin:  Negative for rash.  Neurological:  Negative for syncope, weakness and headaches.  Psychiatric/Behavioral:  Positive for sleep disturbance. Negative for confusion and hallucinations.    Current Outpatient Medications on File Prior to Visit  Medication Sig Dispense Refill   Acetaminophen 500 MG capsule Take 1,000 mg by mouth daily as needed for fever.     amLODipine (NORVASC) 5 MG tablet Take 1 tablet (5 mg total) by mouth daily. 90 tablet 1   apixaban (ELIQUIS) 5 MG TABS tablet Take 1 tablet (5 mg total) by mouth 2 (two) times daily. 180 tablet 2   carvedilol (COREG) 6.25 MG tablet Take 1 tablet (6.25 mg total) by mouth 2 (two) times daily. 180 tablet 1   EPINEPHrine (EPIPEN  2-PAK) 0.3 mg/0.3 mL IJ SOAJ injection To use daily if needed for severe allergy reaction. 1 each 0   folic acid (FOLVITE) 1 MG tablet Take 1 tablet (1 mg total) by mouth daily. 90 tablet 2   furosemide (LASIX) 20 MG tablet Take 1 tablet (20 mg total) by mouth daily as needed (for weight increase of 3 lbs overnight or 5 lbs in 1 week). 30 tablet 3   irbesartan (AVAPRO) 150 MG tablet Take 1 tablet (150 mg total) by mouth daily. 90 tablet 2   magnesium oxide (MAG-OX) 400 MG tablet Take 1 tablet (400  mg total) by mouth daily. 90 tablet 2   nicotine (NICODERM CQ - DOSED IN MG/24 HOURS) 21 mg/24hr patch Place 1 patch (21 mg total) onto the skin daily. 28 patch 0   omeprazole (PRILOSEC) 20 MG capsule TAKE 1 CAPSULE BY MOUTH TWICE A DAY BEFORE MEALS 180 capsule 1   rosuvastatin (CRESTOR) 20 MG tablet TAKE ONE TABLET BY MOUTH DAILY 90 tablet 2   thiamine (VITAMIN B1) 100 MG tablet TAKE 1 TABLET BY MOUTH DAILY 90 tablet 3   triamcinolone cream (KENALOG) 0.1 % Apply 1 Application topically 2 (two) times daily. 30 g 1   potassium chloride (KLOR-CON) 10 MEQ tablet Take 1 tablet (10 mEq total) by mouth daily as needed (with lasix). 30 tablet 3   No current facility-administered medications on file prior to visit.   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 diverticulosis also. 11/24/2010: 12 polyps removed - adenomas Anticipate routine repeat colonoscopy Spring 2013 Have recommended he see genetics counselor re ? Polyposis 03/27/2016 14 polyps removed max 15 mm    Pulmonary nodule 06/2006   9 mm right upper obe pulmonary nodule (negative Pet Scan)   PVD (peripheral vascular disease) (HCC)    left leg s/p stent   Sebaceous cyst    neck   Seborrheic keratosis    Seizures (HCC)    4-5 yrs ago had seizures when in ICU- none since this time    Sinusitis    Tobacco abuse    Tremor     Past Surgical History:  Procedure Laterality Date   ANGIOPLASTY / STENTING ILIAC  08/2008   Dr. Kenard Gower SURGERY  1975, 1978   x2 lumbar fusion over 30 years ago   BACK SURGERY     COLONOSCOPY  05/11/2006; 07/27/2006;05/05/2007, 11/24/2010   adenomas with 9 polyps index and 2.5 cm cecal polyp removed over 3 exams, last 2009 with APC. diverticulosis also.  2012: 12 adenomas removed, largest 8-10 mm, diverticulosis   CORONARY ANGIOPLASTY WITH STENT PLACEMENT     DESCENDING AORTIC ANEURYSM REPAIR W/ STENT  04/2010   Dr. Myra Gianotti   ESOPHAGOGASTRODUODENOSCOPY  05/05/2007   GERD - one erosion   EYE SURGERY Bilateral    cataract   KNEE  SURGERY  2002   left- cartilage    LUNG BIOPSY     PERCUTANEOUS PINNING Left 06/24/2012   Procedure: Repair Complex Laceration Left Thumb with percutaneous pinning.;  Surgeon: Johnette Abraham, MD;  Location: [facility] OR;  Service: Plastics;  Laterality: Left;   THUMB AMPUTATION Left    pinning and index finger   TONSILLECTOMY  1956   TONSILLECTOMY     UPPER GASTROINTESTINAL  ENDOSCOPY     VIDEO ASSISTED THORACOSCOPY (VATS)/WEDGE RESECTION Right 01/24/2015   Procedure: RIGHT VIDEO ASSISTED THORACOSCOPY (VATS)/WEDGE RESECTION;  Surgeon: Loreli Slot, MD;  Location: [facility] OR;  Service: Thoracic;  Laterality: Right;   vocal cord polyps removed      Allergies  Allergen Reactions   Codeine Anaphylaxis   Hydrocodone Itching    Family History  Problem Relation Age of Onset   Colon cancer Mother 77       multiple colon polyps   Deep vein thrombosis Mother    Cancer Mother 44       uterine cancer   Uterine cancer Mother    Lung cancer Mother    Lung cancer Son        smoked and was a welder   Heart disease Brother    Diabetes Other    Heart disease Brother    Rheum arthritis Daughter    Rheum arthritis Sister    Colon polyps Neg Hx    Stomach cancer Neg Hx    Rectal cancer Neg Hx     Social History   Socioeconomic History   Marital status: Widowed    Spouse name: Not on file   Number of children: 2   Years of education: Not on file   Highest education level: Not on file  Occupational History   Occupation: Retired    Associate Professor: RETIRED  Tobacco Use   Smoking status: Every Day    Current packs/day: 1.00    Average packs/day: 1 pack/day for 60.0 years (60.0 ttl pk-yrs)    Types: Cigarettes   Smokeless tobacco: Never  Vaping Use   Vaping status: Never Used  Substance and Sexual Activity   Alcohol use: Yes    Comment: beer on occasion   Drug use: No   Sexual activity: Not Currently  Other Topics Concern   Not on file  Social History Narrative       Married for over 50 years.  Wife died from lung cancer.     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     Occasional alcohol.      Tobacco use - over 50 pack years.   Father died at age 52 of suicide.   [employer] Therapist, occupational   y contact   Social Drivers of Health   Financial Resource Strain: Low Risk  (11/03/2021)   Overall Financial Resource Strain  (CARDIA)    Difficulty of Paying Living Expenses: Not hard at all  Food Insecurity: No Food Insecurity (05/10/2022)   Hunger Vital Sign    Worried About Running Out of Food in the Last Year: Never true    Ran Out of Food in the Last Year: Never true  Transportation Needs: No Transportation Needs (05/10/2022)   PRAPARE - Administrator, Civil Service (Medical): No    Lack of Transportation (Non-Medical): No  Physical Activity: Inactive (11/03/2021)   Exercise Vital Sign    Days of Exercise per Week: 0 days    Minutes  of Exercise per Session: 0 min  Stress: Stress Concern Present (11/03/2021)   [employer] of Occupational Health - Occupational Stress Questionnaire    Feeling of Stress : To some extent  Social Connections: Socially Isolated (05/08/2020)   Social Connection and Isolation Panel [NHANES]    Frequency of Communication with Friends and Family: More than three times a week    Frequency of Social Gatherings with Friends and Family: More than three times a week    Attends Religious Services: Never    Database administrator or Organizations: No    Attends Banker Meetings: Never    Marital Status: Widowed   Vitals:   04/14/23 1247  BP: 132/80  Pulse: 86  Resp: 16  Temp: (!) 97.3 F (36.3 C)  SpO2: 95%   Body mass index is 24.4 kg/m.  Wt Readings from Last 3 Encounters:  04/14/23 160 lb 8 oz (72.8 kg)  08/31/22 166 lb 4 oz (75.4 kg)  07/01/22 162 lb 2 oz (73.5 kg)   Physical Exam Vitals and nursing note reviewed.  Constitutional:      General: He is not in acute distress.    Appearance: He is well-developed.  HENT:     Head: Normocephalic and atraumatic.     Right Ear: External ear normal.     Left Ear: External ear normal. Tympanic membrane is not erythematous.     Ears:     Comments: Cerumen excess in ear canals R>L, left TM seen partially.    Mouth/Throat:     Mouth: Mucous membranes are moist.     Dentition: Has dentures.      Pharynx: Oropharynx is clear. Uvula midline.  Eyes:     Conjunctiva/sclera: Conjunctivae normal.     Pupils: Pupils are equal, round, and reactive to light.  Neck:     Thyroid: No thyroid mass.  Cardiovascular:     Rate and Rhythm: Normal rate and regular rhythm.     Heart sounds: No murmur heard.    Comments: PT pulses palpable Pulmonary:     Effort: Pulmonary effort is normal. No respiratory distress.     Breath sounds: Normal breath sounds.  Abdominal:     Palpations: Abdomen is soft. There is no hepatomegaly or mass.     Tenderness: There is no abdominal tenderness.  Genitourinary:    Comments: No concerns. Musculoskeletal:        General: No tenderness.     Cervical back: Normal range of motion.     Right lower leg: 1+ Pitting Edema present.     Left lower leg: 1+ Pitting Edema present.     Comments: No signs of synovitis.  Lymphadenopathy:     Cervical: No cervical adenopathy.  Skin:    General: Skin is warm.     Findings: No erythema.  Neurological:     General: No focal deficit present.     Mental Status: He is alert and oriented to person, place, and time.     Cranial Nerves: No cranial nerve deficit.     Deep Tendon Reflexes:     Reflex Scores:      Bicep reflexes are 2+ on the right side and 2+ on the left side.      Patellar reflexes are 2+ on the right side and 2+ on the left side.    Comments: Unstable and antalgic gait, not assisted.  Psychiatric:        Mood and Affect: Mood and affect  normal.    ASSESSMENT AND PLAN:  Mr. Haapala was seen today for his routine general medical examination.   Orders Placed This Encounter  Procedures   Comprehensive metabolic panel   Lipid panel   TSH   Urinalysis, Routine w reflex microscopic   CBC   Magnesium   Ambulatory referral to Pain Clinic   Lab Results  Component Value Date   NA 141 04/14/2023   CL 104 04/14/2023   K 4.2 04/14/2023   CO2 31 04/14/2023   BUN 20 04/14/2023   CREATININE 1.01 04/14/2023    GFR 69.46 04/14/2023   CALCIUM 9.0 04/14/2023   ALBUMIN 4.2 04/14/2023   GLUCOSE 100 (H) 04/14/2023   Lab Results  Component Value Date   ALT 8 04/14/2023   AST 12 04/14/2023   ALKPHOS 91 04/14/2023   BILITOT 0.7 04/14/2023   Lab Results  Component Value Date   CHOL 134 04/14/2023   HDL 45.80 04/14/2023   LDLCALC 62 04/14/2023   LDLDIRECT 79.0 10/13/2021   TRIG 131.0 04/14/2023   CHOLHDL 3 04/14/2023   Lab Results  Component Value Date   TSH 2.85 04/14/2023   Routine general medical examination at a health care facility We discussed the importance of regular physical activity and healthy diet for prevention of chronic illness and/or complications. Preventive guidelines reviewed. Vaccination up to date. Fall precautions. Not interested in smoking cessation. Next CPE in a year.  Malignant neoplasm of unspecified part of unspecified bronchus or lung [facility]) He has not followed with oncologist as instructed. Recommend arranging appt with Dr Arbutus Ped. He is not interested in smocking cessation.  Essential hypertension Assessment & Plan: Overall BP is adequately controlled, reporting similar readings at home. Continue amlodipine 5 mg daily, carvedilol 6.25 mg twice daily, and Irbesartan 150 mg daily. Low-salt diet recommended. Follow-up in 5 to 6 months.  Orders: -     Comprehensive metabolic panel; Future -     TSH; Future -     CBC; Future  Hyperlipemia, mixed Assessment & Plan: Continue Rosuvastatin 20 mg daily and low fat diet. Further recommendations according to FLP result.  Orders: -     Lipid panel; Future  Bad odor of urine -     Urinalysis, Routine w reflex microscopic; Future  Chronic low back pain without sciatica, unspecified back pain laterality Assessment & Plan: Problem is getting worse, Zanaflex is not helping. Duloxetine dose increased from 30 mg to 60 mg daily. He agrees with referral to physical medicine rehab for pain  management.  Orders: -     DULoxetine HCl; Take 1 capsule (60 mg total) by mouth daily.  Dispense: 90 capsule; Refill: 2 -     Ambulatory referral to Pain Clinic  Hypomagnesemia Assessment & Plan: Continue current dose of magnesium supplementation. Further recommendation will be given according to magnesium result.  Orders: -     Magnesium; Future  Alcohol dependence, uncomplicated (HCC) Assessment & Plan: He is not interested in attending AA meetings, has done it before and did not help. We discussed adverse effects of alcohol intake, strongly recommend decreasing consumption.  Recurrent major depressive disorder, in partial remission (HCC) Assessment & Plan: Usually more symptomatic during the holidays and gradually improving. Duloxetine dose increased from 30 mg to 60 mg daily.  Orders: -     DULoxetine HCl; Take 1 capsule (60 mg total) by mouth daily.  Dispense: 90 capsule; Refill: 2  Malignant neoplasm of upper lobe of left lung [facility]) Assessment &  Plan: Still smoking and not interested in cessation. He has not followed with oncology since 03/2021. Recommend arranging appointment for follow-up.  Return in 6 months (on 10/12/2023) for chronic problems.  I, Rolla Etienne Wierda, acting as a scribe for Rayden Scheper [place], MD., have documented all relevant documentation on the behalf of Haislee Corso [place], MD, as directed by  Meridee Branum [place], MD while in the presence of Patte Winkel [place], MD.   I, Stellah Donovan [place], MD, have reviewed all documentation for this visit. The documentation on 04/14/23 for the exam, diagnosis, procedures, and orders are all accurate and complete.  Cleora Karnik G. [place], MD  [facility] [place] office.

## 2023-04-14 NOTE — Assessment & Plan Note (Signed)
Still smoking and not interested in cessation. He has not followed with oncology since 03/2021. Recommend arranging appointment for follow-up.

## 2023-04-19 ENCOUNTER — Encounter: Payer: Self-pay | Admitting: Physical Medicine & Rehabilitation

## 2023-04-22 ENCOUNTER — Inpatient Hospital Stay (HOSPITAL_COMMUNITY)
Admission: EM | Admit: 2023-04-22 | Discharge: 2023-04-27 | DRG: 565 | Disposition: A | Discharge status: Home Health | Payer: Medicare HMO | Attending: Internal Medicine | Admitting: Internal Medicine

## 2023-04-22 ENCOUNTER — Other Ambulatory Visit: Payer: Self-pay

## 2023-04-22 ENCOUNTER — Emergency Department (HOSPITAL_COMMUNITY): Payer: Medicare HMO

## 2023-04-22 ENCOUNTER — Encounter (HOSPITAL_COMMUNITY): Payer: Self-pay | Admitting: Emergency Medicine

## 2023-04-22 DIAGNOSIS — M1990 Unspecified osteoarthritis, unspecified site: Secondary | ICD-10-CM | POA: Diagnosis present

## 2023-04-22 DIAGNOSIS — W1830XA Fall on same level, unspecified, initial encounter: Secondary | ICD-10-CM | POA: Diagnosis present

## 2023-04-22 DIAGNOSIS — F32A Depression, unspecified: Secondary | ICD-10-CM | POA: Diagnosis not present

## 2023-04-22 DIAGNOSIS — I480 Paroxysmal atrial fibrillation: Secondary | ICD-10-CM | POA: Diagnosis present

## 2023-04-22 DIAGNOSIS — I739 Peripheral vascular disease, unspecified: Secondary | ICD-10-CM | POA: Diagnosis not present

## 2023-04-22 DIAGNOSIS — Z885 Allergy status to narcotic agent status: Secondary | ICD-10-CM

## 2023-04-22 DIAGNOSIS — Z833 Family history of diabetes mellitus: Secondary | ICD-10-CM | POA: Diagnosis not present

## 2023-04-22 DIAGNOSIS — G8220 Paraplegia, unspecified: Secondary | ICD-10-CM | POA: Diagnosis present

## 2023-04-22 DIAGNOSIS — Z801 Family history of malignant neoplasm of trachea, bronchus and lung: Secondary | ICD-10-CM | POA: Diagnosis not present

## 2023-04-22 DIAGNOSIS — M4802 Spinal stenosis, cervical region: Secondary | ICD-10-CM | POA: Diagnosis not present

## 2023-04-22 DIAGNOSIS — Z860109 Personal history of other colon polyps: Secondary | ICD-10-CM

## 2023-04-22 DIAGNOSIS — Z860101 Personal history of adenomatous and serrated colon polyps: Secondary | ICD-10-CM | POA: Diagnosis not present

## 2023-04-22 DIAGNOSIS — K219 Gastro-esophageal reflux disease without esophagitis: Secondary | ICD-10-CM | POA: Diagnosis present

## 2023-04-22 DIAGNOSIS — R269 Unspecified abnormalities of gait and mobility: Secondary | ICD-10-CM | POA: Diagnosis present

## 2023-04-22 DIAGNOSIS — G8929 Other chronic pain: Secondary | ICD-10-CM | POA: Diagnosis present

## 2023-04-22 DIAGNOSIS — G9389 Other specified disorders of brain: Secondary | ICD-10-CM | POA: Diagnosis not present

## 2023-04-22 DIAGNOSIS — D689 Coagulation defect, unspecified: Secondary | ICD-10-CM | POA: Diagnosis present

## 2023-04-22 DIAGNOSIS — Y92009 Unspecified place in unspecified non-institutional (private) residence as the place of occurrence of the external cause: Secondary | ICD-10-CM

## 2023-04-22 DIAGNOSIS — I10 Essential (primary) hypertension: Secondary | ICD-10-CM | POA: Diagnosis not present

## 2023-04-22 DIAGNOSIS — R296 Repeated falls: Secondary | ICD-10-CM | POA: Diagnosis present

## 2023-04-22 DIAGNOSIS — E7800 Pure hypercholesterolemia, unspecified: Secondary | ICD-10-CM | POA: Diagnosis present

## 2023-04-22 DIAGNOSIS — Y92000 Kitchen of unspecified non-institutional (private) residence as  the place of occurrence of the external cause: Secondary | ICD-10-CM

## 2023-04-22 DIAGNOSIS — T796XXA Traumatic ischemia of muscle, initial encounter: Principal | ICD-10-CM

## 2023-04-22 DIAGNOSIS — Z7901 Long term (current) use of anticoagulants: Secondary | ICD-10-CM | POA: Diagnosis not present

## 2023-04-22 DIAGNOSIS — Z66 Do not resuscitate: Secondary | ICD-10-CM | POA: Diagnosis present

## 2023-04-22 DIAGNOSIS — W19XXXA Unspecified fall, initial encounter: Principal | ICD-10-CM

## 2023-04-22 DIAGNOSIS — S3992XA Unspecified injury of lower back, initial encounter: Secondary | ICD-10-CM | POA: Diagnosis not present

## 2023-04-22 DIAGNOSIS — Z8249 Family history of ischemic heart disease and other diseases of the circulatory system: Secondary | ICD-10-CM | POA: Diagnosis not present

## 2023-04-22 DIAGNOSIS — E876 Hypokalemia: Secondary | ICD-10-CM | POA: Diagnosis present

## 2023-04-22 DIAGNOSIS — M545 Low back pain, unspecified: Secondary | ICD-10-CM | POA: Diagnosis not present

## 2023-04-22 DIAGNOSIS — Z8261 Family history of arthritis: Secondary | ICD-10-CM

## 2023-04-22 DIAGNOSIS — C3491 Malignant neoplasm of unspecified part of right bronchus or lung: Secondary | ICD-10-CM | POA: Diagnosis present

## 2023-04-22 DIAGNOSIS — M47816 Spondylosis without myelopathy or radiculopathy, lumbar region: Secondary | ICD-10-CM | POA: Diagnosis not present

## 2023-04-22 DIAGNOSIS — J449 Chronic obstructive pulmonary disease, unspecified: Secondary | ICD-10-CM | POA: Diagnosis present

## 2023-04-22 DIAGNOSIS — F17210 Nicotine dependence, cigarettes, uncomplicated: Secondary | ICD-10-CM

## 2023-04-22 DIAGNOSIS — Z955 Presence of coronary angioplasty implant and graft: Secondary | ICD-10-CM | POA: Diagnosis not present

## 2023-04-22 DIAGNOSIS — M6282 Rhabdomyolysis: Secondary | ICD-10-CM | POA: Diagnosis present

## 2023-04-22 DIAGNOSIS — R748 Abnormal levels of other serum enzymes: Secondary | ICD-10-CM

## 2023-04-22 DIAGNOSIS — Z86718 Personal history of other venous thrombosis and embolism: Secondary | ICD-10-CM

## 2023-04-22 DIAGNOSIS — S0291XA Unspecified fracture of skull, initial encounter for closed fracture: Secondary | ICD-10-CM | POA: Diagnosis not present

## 2023-04-22 DIAGNOSIS — Z83719 Family history of colon polyps, unspecified: Secondary | ICD-10-CM | POA: Diagnosis not present

## 2023-04-22 DIAGNOSIS — M47812 Spondylosis without myelopathy or radiculopathy, cervical region: Secondary | ICD-10-CM | POA: Diagnosis not present

## 2023-04-22 DIAGNOSIS — Z79899 Other long term (current) drug therapy: Secondary | ICD-10-CM

## 2023-04-22 DIAGNOSIS — M4856XA Collapsed vertebra, not elsewhere classified, lumbar region, initial encounter for fracture: Secondary | ICD-10-CM | POA: Diagnosis not present

## 2023-04-22 DIAGNOSIS — I700 Atherosclerosis of aorta: Secondary | ICD-10-CM | POA: Diagnosis not present

## 2023-04-22 DIAGNOSIS — S199XXA Unspecified injury of neck, initial encounter: Secondary | ICD-10-CM | POA: Diagnosis not present

## 2023-04-22 DIAGNOSIS — M549 Dorsalgia, unspecified: Secondary | ICD-10-CM | POA: Diagnosis not present

## 2023-04-22 DIAGNOSIS — M4801 Spinal stenosis, occipito-atlanto-axial region: Secondary | ICD-10-CM | POA: Diagnosis not present

## 2023-04-22 DIAGNOSIS — S022XXA Fracture of nasal bones, initial encounter for closed fracture: Secondary | ICD-10-CM | POA: Diagnosis not present

## 2023-04-22 DIAGNOSIS — R531 Weakness: Secondary | ICD-10-CM | POA: Diagnosis present

## 2023-04-22 DIAGNOSIS — Z043 Encounter for examination and observation following other accident: Secondary | ICD-10-CM | POA: Diagnosis not present

## 2023-04-22 LAB — COMPREHENSIVE METABOLIC PANEL
ALT: 18 U/L (ref 0–44)
AST: 48 U/L — ABNORMAL HIGH (ref 15–41)
Albumin: 3.6 g/dL (ref 3.5–5.0)
Alkaline Phosphatase: 84 U/L (ref 38–126)
Anion gap: 11 (ref 5–15)
BUN: 13 mg/dL (ref 8–23)
CO2: 26 mmol/L (ref 22–32)
Calcium: 8.5 mg/dL — ABNORMAL LOW (ref 8.9–10.3)
Chloride: 97 mmol/L — ABNORMAL LOW (ref 98–111)
Creatinine, Ser: 1.11 mg/dL (ref 0.61–1.24)
GFR, Estimated: >60 mL/min (ref >60)
Glucose, Bld: 89 mg/dL (ref 70–99)
Potassium: 3.6 mmol/L (ref 3.5–5.1)
Sodium: 134 mmol/L — ABNORMAL LOW (ref 135–145)
Total Bilirubin: 0.9 mg/dL (ref 0.0–1.2)
Total Protein: 6.7 g/dL (ref 6.5–8.1)

## 2023-04-22 LAB — URINALYSIS, ROUTINE W REFLEX MICROSCOPIC
Bilirubin Urine: NEGATIVE
Glucose, UA: NEGATIVE mg/dL
Ketones, ur: 20 mg/dL — AB
Leukocytes,Ua: NEGATIVE
Nitrite: NEGATIVE
Protein, ur: 30 mg/dL — AB
Specific Gravity, Urine: 1.018 (ref 1.005–1.030)
pH: 5 (ref 5.0–8.0)

## 2023-04-22 LAB — CK
Total CK: 1605 U/L — ABNORMAL HIGH (ref 49–397)
Total CK: 1822 U/L — ABNORMAL HIGH (ref 49–397)

## 2023-04-22 LAB — TROPONIN I (HIGH SENSITIVITY)
Troponin I (High Sensitivity): 10 ng/L (ref <18)
Troponin I (High Sensitivity): 5 ng/L (ref <18)

## 2023-04-22 MED ORDER — APIXABAN 5 MG PO TABS
5.0000 mg | ORAL_TABLET | Freq: Two times a day (BID) | ORAL | Status: DC
  Administered 2023-04-22 – 2023-04-27 (×10): 5 mg via ORAL
  Filled 2023-04-22 (×10): qty 1

## 2023-04-22 MED ORDER — FOLIC ACID 1 MG PO TABS
1.0000 mg | ORAL_TABLET | Freq: Every day | ORAL | Status: DC
  Administered 2023-04-23 – 2023-04-27 (×5): 1 mg via ORAL
  Filled 2023-04-22 (×5): qty 1

## 2023-04-22 MED ORDER — LACTATED RINGERS IV BOLUS
1000.0000 mL | Freq: Once | INTRAVENOUS | Status: AC
  Administered 2023-04-22: 1000 mL via INTRAVENOUS

## 2023-04-22 MED ORDER — SODIUM CHLORIDE 0.9% FLUSH
3.0000 mL | Freq: Two times a day (BID) | INTRAVENOUS | Status: DC
  Administered 2023-04-22 – 2023-04-27 (×10): 3 mL via INTRAVENOUS

## 2023-04-22 MED ORDER — PANTOPRAZOLE SODIUM 40 MG PO TBEC
40.0000 mg | DELAYED_RELEASE_TABLET | Freq: Every day | ORAL | Status: DC
  Administered 2023-04-23 – 2023-04-27 (×5): 40 mg via ORAL
  Filled 2023-04-22 (×5): qty 1

## 2023-04-22 MED ORDER — THIAMINE MONONITRATE 100 MG PO TABS
100.0000 mg | ORAL_TABLET | Freq: Every day | ORAL | Status: DC
  Administered 2023-04-23 – 2023-04-27 (×5): 100 mg via ORAL
  Filled 2023-04-22 (×5): qty 1

## 2023-04-22 MED ORDER — CARVEDILOL 6.25 MG PO TABS
6.2500 mg | ORAL_TABLET | Freq: Two times a day (BID) | ORAL | Status: DC
  Administered 2023-04-23 – 2023-04-27 (×9): 6.25 mg via ORAL
  Filled 2023-04-22 (×9): qty 1

## 2023-04-22 MED ORDER — ACETAMINOPHEN 650 MG RE SUPP: 650.0000 mg | Freq: Four times a day (QID) | RECTAL | Status: DC | PRN

## 2023-04-22 MED ORDER — IRBESARTAN 300 MG PO TABS
150.0000 mg | ORAL_TABLET | Freq: Every day | ORAL | Status: DC
  Administered 2023-04-23 – 2023-04-27 (×5): 150 mg via ORAL
  Filled 2023-04-22 (×5): qty 1

## 2023-04-22 MED ORDER — AMLODIPINE BESYLATE 5 MG PO TABS
5.0000 mg | ORAL_TABLET | Freq: Every day | ORAL | Status: DC
  Administered 2023-04-23 – 2023-04-27 (×5): 5 mg via ORAL
  Filled 2023-04-22 (×5): qty 1

## 2023-04-22 MED ORDER — ACETAMINOPHEN 325 MG PO TABS
650.0000 mg | ORAL_TABLET | Freq: Four times a day (QID) | ORAL | Status: DC | PRN
  Administered 2023-04-24: 650 mg via ORAL
  Filled 2023-04-22: qty 2

## 2023-04-22 MED ORDER — STERILE WATER FOR INJECTION IV SOLN
INTRAVENOUS | Status: DC
  Filled 2023-04-22 (×2): qty 1000

## 2023-04-22 MED ORDER — POLYETHYLENE GLYCOL 3350 17 G PO PACK: 17.0000 g | PACK | Freq: Every day | ORAL | Status: DC | PRN

## 2023-04-22 MED ORDER — MAGNESIUM OXIDE -MG SUPPLEMENT 400 (240 MG) MG PO TABS
400.0000 mg | ORAL_TABLET | Freq: Every day | ORAL | Status: DC
  Administered 2023-04-23 – 2023-04-27 (×5): 400 mg via ORAL
  Filled 2023-04-22 (×6): qty 1

## 2023-04-22 MED ORDER — DULOXETINE HCL 60 MG PO CPEP
60.0000 mg | ORAL_CAPSULE | Freq: Every day | ORAL | Status: DC
  Administered 2023-04-23 – 2023-04-27 (×5): 60 mg via ORAL
  Filled 2023-04-22 (×5): qty 1

## 2023-04-22 NOTE — Assessment & Plan Note (Signed)
 Patient has a pattern of chronic falls due to chronic gait abnormality/paraparesis described by patient therefore current fall is not a change in his functional status.  Will request physical therapy evaluation.  Patient does report new back pain/worsening back pain since the fall, which is affecting the lumbar area.  CAT scan in chart.  Will continue with apixaban  therapy for patient's A-fib

## 2023-04-22 NOTE — ED Triage Notes (Signed)
 Pt via [facility] from home after a mechanical fall this morning at about 7am. He was unable to get up and spent 8 hours in the floor before he was able to get to a phone to call for help. Pt takes eliquis  but denies hitting head, no LOC. Reporting 4/10 lower back pain. VSS en route and at time of arrival. A/O x 4.

## 2023-04-22 NOTE — ED Notes (Signed)
 Pt returned from CT

## 2023-04-22 NOTE — H&P (Signed)
 History and Physical    Patient: Timothy Wood FMW:[id number] DOB: 1941/03/16 DOA: 04/22/2023 DOS: the patient was seen and examined on 04/22/2023 PCP: Jordan, [name], MD  Patient coming from: Home  Chief Complaint:  Chief Complaint  Patient presents with   Fall   HPI: Timothy Wood is a 83 y.o. male with medical history significant of prior/remote fractures of the lower spine complicated by chronic lower extremity weakness.  However patient lives independently at home.  Patient describes prior episodes of having had fall at home and being unable to get back up.  Patient advises that 1 time it was 7 days before he was able to be found and taken care of.  Patient states that he was in his chair earlier today and unfortunately slipped out of it while getting out and could not get back up.  Patient was on the floor for approximately 8 hours before he was finally able to seek help via telephone.  Patient reports new/worsened low back pain since then.  Otherwise no chest pain shortness of breath vomiting diarrhea fever rash and skin etc.  Patient has been vitally stable in the ER.  Medical evaluation is sought. Review of Systems: As mentioned in the history of present illness. All other systems reviewed and are negative. Past Medical History:  Diagnosis Date   Adenocarcinoma of lung, stage 1 (HCC)    Anemia    Angiodysplasia of cecum 11/23/2017   Multiple 1-2 mm   Arthritis    back possibly   Blood clotting disorder (HCC)    Cataract    removed bilaterally    Chronic obstructive pulmonary disease (COPD) (HCC)    patient not aware   Clotting disorder (HCC)    PE LUNG, dvt leg    Colon polyps    Depression    Diverticulosis    DVT (deep venous thrombosis) (HCC)    Post op  knee surgery   DVT (deep venous thrombosis) (HCC)    GERD (gastroesophageal reflux disease)    Histoplasmosis    Histoplasmosis with pneumonia (HCC) 04/01/2015   Hypercholesteremia    Hyperlipidemia     Hypertension    Laceration of head 08/05/2015   Lumbar compression fracture (HCC)    Lung cancer (HCC) dx'd 2016   Personal history of colonic polyps 2008   multiple adenomas 2008-2009 exams with large cecal adenoma   Pneumonia    in history   Polyposis coli - attenuated 09/12/2010   2008: adenomas with 9 polyps index and 2.5 cm cecal polyp removed over 3 exams, last 2009 with APC. diverticulosis also. 11/24/2010: 12 polyps removed - adenomas Anticipate routine repeat colonoscopy Spring 2013 Have recommended he see genetics counselor re ? Polyposis 03/27/2016 14 polyps removed max 15 mm    Pulmonary nodule 06/2006   9 mm right upper obe pulmonary nodule (negative Pet Scan)   PVD (peripheral vascular disease) (HCC)    left leg s/p stent   Sebaceous cyst    neck   Seborrheic keratosis    Seizures (HCC)    4-5 yrs ago had seizures when in ICU- none since this time    Sinusitis    Tobacco abuse    Tremor    Past Surgical History:  Procedure Laterality Date   ANGIOPLASTY / STENTING ILIAC  08/2008   Dr. Serene SANES SURGERY  1975, 1978   x2 lumbar fusion over 30 years ago   BACK SURGERY     COLONOSCOPY  05/11/2006; 07/27/2006;05/05/2007, 11/24/2010   adenomas with 9 polyps index and 2.5 cm cecal polyp removed over 3 exams, last 2009 with APC. diverticulosis also.  2012: 12 adenomas removed, largest 8-10 mm, diverticulosis   CORONARY ANGIOPLASTY WITH STENT PLACEMENT     DESCENDING AORTIC ANEURYSM REPAIR W/ STENT  04/2010   Dr. Serene   ESOPHAGOGASTRODUODENOSCOPY  05/05/2007   GERD - one erosion   EYE SURGERY Bilateral    cataract   KNEE SURGERY  2002   left- cartilage    LUNG BIOPSY     PERCUTANEOUS PINNING Left 06/24/2012   Procedure: Repair Complex Laceration Left Thumb with percutaneous pinning.;  Surgeon: Balinda JAYSON Rogue, MD;  Location: [facility] OR;  Service: Plastics;  Laterality: Left;   THUMB AMPUTATION Left    pinning and index finger   TONSILLECTOMY  1956   TONSILLECTOMY     UPPER  GASTROINTESTINAL ENDOSCOPY     VIDEO ASSISTED THORACOSCOPY (VATS)/WEDGE RESECTION Right 01/24/2015   Procedure: RIGHT VIDEO ASSISTED THORACOSCOPY (VATS)/WEDGE RESECTION;  Surgeon: Elspeth JAYSON Millers, MD;  Location: [facility] OR;  Service: Thoracic;  Laterality: Right;   vocal cord polyps removed     Social History:  reports that he has been smoking cigarettes. He has a 60 pack-year smoking history. He has never used smokeless tobacco. He reports current alcohol use. He reports that he does not use drugs.  Allergies  Allergen Reactions   Codeine Anaphylaxis   Hydrocodone  Itching    Family History  Problem Relation Age of Onset   Colon cancer Mother 50       multiple colon polyps   Deep vein thrombosis Mother    Cancer Mother 56       uterine cancer   Uterine cancer Mother    Lung cancer Mother    Lung cancer Son        smoked and was a welder   Heart disease Brother    Diabetes Other    Heart disease Brother    Rheum arthritis Daughter    Rheum arthritis Sister    Colon polyps Neg Hx    Stomach cancer Neg Hx    Rectal cancer Neg Hx     Prior to Admission medications   Medication Sig Start Date End Date Taking? Authorizing Provider  Acetaminophen  500 MG capsule Take 1,000 mg by mouth daily as needed for fever.   Yes [provider]  amLODipine  (NORVASC ) 5 MG tablet Take 1 tablet (5 mg total) by mouth daily. 12/11/22  Yes Kate Lonni CROME, MD  apixaban  (ELIQUIS ) 5 MG TABS tablet Take 1 tablet (5 mg total) by mouth 2 (two) times daily. 07/13/22  Yes Jordan, [name], MD  carvedilol  (COREG ) 6.25 MG tablet Take 1 tablet (6.25 mg total) by mouth 2 (two) times daily. 04/29/22  Yes Kate Lonni CROME, MD  DULoxetine  (CYMBALTA ) 60 MG capsule Take 1 capsule (60 mg total) by mouth daily. 04/14/23  Yes Jordan, [name], MD  EPINEPHrine  (EPIPEN  2-PAK) 0.3 mg/0.3 mL IJ SOAJ injection To use daily if needed for severe allergy reaction. 08/20/21  Yes Jordan, [name], MD  folic acid   (FOLVITE ) 1 MG tablet Take 1 tablet (1 mg total) by mouth daily. 07/13/22  Yes Jordan, [name], MD  irbesartan  (AVAPRO ) 150 MG tablet Take 1 tablet (150 mg total) by mouth daily. 07/02/22  Yes Jordan, [name], MD  magnesium  oxide (MAG-OX) 400 MG tablet Take 1 tablet (400 mg total) by mouth daily. 08/31/22  Yes  Jordan, [name], MD  omeprazole  (PRILOSEC) 20 MG capsule TAKE 1 CAPSULE BY MOUTH TWICE A DAY BEFORE MEALS 01/18/23  Yes Jordan, [name], MD  potassium chloride  (KLOR-CON ) 10 MEQ tablet Take 1 tablet (10 mEq total) by mouth daily as needed (with lasix ). 04/22/22 04/22/23 Yes Kate Lonni CROME, MD  rosuvastatin  (CRESTOR ) 20 MG tablet TAKE ONE TABLET BY MOUTH DAILY 09/15/22  Yes Jordan, [name], MD  thiamine  (VITAMIN B1) 100 MG tablet TAKE 1 TABLET BY MOUTH DAILY 09/15/22  Yes Jordan, [name], MD  furosemide  (LASIX ) 20 MG tablet Take 1 tablet (20 mg total) by mouth daily as needed (for weight increase of 3 lbs overnight or 5 lbs in 1 week). Patient not taking: Reported on 04/22/2023 04/22/22   Kate Lonni CROME, MD  nicotine  (NICODERM CQ  - DOSED IN MG/24 HOURS) 21 mg/24hr patch Place 1 patch (21 mg total) onto the skin daily. Patient not taking: Reported on 04/22/2023 06/09/22   Dennise Lavada POUR, MD    Physical Exam: Vitals:   04/22/23 1800 04/22/23 1930 04/22/23 2100 04/22/23 2130  BP: (!) 142/98 (!) 176/83 (!) 169/89 (!) 167/88  Pulse: 86 80 84 87  Resp: 20 (!) 23 (!) 26 (!) 24  Temp:  99.2 F (37.3 C)    TempSrc:  Oral    SpO2: 96% 100% 91% 95%  Weight:      Height:       General: Patient is alert and awake, gives a fully coherent account of his symptoms.  Appears to be in no distress. Respiratory exam: Bilateral intravesicular Cardiovascular exam S1-S2 normal Abdomen all quadrant soft nontender Extremities warm without edema I do not appreciate any focal motor deficit.  Patient is able to demonstrate antigravity strength in all extremities, neck movements are free no cranial focal  tenderness is noted Pelvis is stable There is some tenderness over L3-L4 and the back. Data Reviewed:  Labs on Admission:  Results for orders placed or performed during the hospital encounter of 04/22/23 (from the past 24 hours)  Comprehensive metabolic panel     Status: Abnormal   Collection Time: 04/22/23  4:37 PM  Result Value Ref Range   Sodium 134 (L) 135 - 145 mmol/L   Potassium 3.6 3.5 - 5.1 mmol/L   Chloride 97 (L) 98 - 111 mmol/L   CO2 26 22 - 32 mmol/L   Glucose, Bld 89 70 - 99 mg/dL   BUN 13 8 - 23 mg/dL   Creatinine, Ser 8.88 0.61 - 1.24 mg/dL   Calcium  8.5 (L) 8.9 - 10.3 mg/dL   Total Protein 6.7 6.5 - 8.1 g/dL   Albumin 3.6 3.5 - 5.0 g/dL   AST 48 (H) 15 - 41 U/L   ALT 18 0 - 44 U/L   Alkaline Phosphatase 84 38 - 126 U/L   Total Bilirubin 0.9 0.0 - 1.2 mg/dL   GFR, Estimated >39 >39 mL/min   Anion gap 11 5 - 15  Troponin I (High Sensitivity)     Status: None   Collection Time: 04/22/23  4:37 PM  Result Value Ref Range   Troponin I (High Sensitivity) 10 <18 ng/L  CK     Status: Abnormal   Collection Time: 04/22/23  4:37 PM  Result Value Ref Range   Total CK 1,605 (H) 49 - 397 U/L  Urinalysis, Routine w reflex microscopic -Urine, Clean Catch     Status: Abnormal   Collection Time: 04/22/23  4:37 PM  Result Value  Ref Range   Color, Urine YELLOW YELLOW   APPearance CLEAR CLEAR   Specific Gravity, Urine 1.018 1.005 - 1.030   pH 5.0 5.0 - 8.0   Glucose, UA NEGATIVE NEGATIVE mg/dL   Hgb urine dipstick MODERATE (A) NEGATIVE   Bilirubin Urine NEGATIVE NEGATIVE   Ketones, ur 20 (A) NEGATIVE mg/dL   Protein, ur 30 (A) NEGATIVE mg/dL   Nitrite NEGATIVE NEGATIVE   Leukocytes,Ua NEGATIVE NEGATIVE   RBC / HPF 0-5 0 - 5 RBC/hpf   WBC, UA 0-5 0 - 5 WBC/hpf   Bacteria, UA RARE (A) NONE SEEN   Squamous Epithelial / HPF 0-5 0 - 5 /HPF   Mucus PRESENT   Troponin I (High Sensitivity)     Status: None   Collection Time: 04/22/23  7:04 PM  Result Value Ref Range    Troponin I (High Sensitivity) 5 <18 ng/L  CK     Status: Abnormal   Collection Time: 04/22/23  9:04 PM  Result Value Ref Range   Total CK 1,822 (H) 49 - 397 U/L   Basic Metabolic Panel: Recent Labs  Lab 04/22/23 1637  NA 134*  K 3.6  CL 97*  CO2 26  GLUCOSE 89  BUN 13  CREATININE 1.11  CALCIUM  8.5*   Liver Function Tests: Recent Labs  Lab 04/22/23 1637  AST 48*  ALT 18  ALKPHOS 84  BILITOT 0.9  PROT 6.7  ALBUMIN 3.6   No results for input(s): LIPASE, AMYLASE in the last 168 hours. No results for input(s): AMMONIA in the last 168 hours. CBC: No results for input(s): WBC, NEUTROABS, HGB, HCT, MCV, PLT in the last 168 hours. Cardiac Enzymes: Recent Labs  Lab 04/22/23 1637 04/22/23 1904 04/22/23 2104  CKTOTAL 1,605*  --  1,822*  TROPONINIHS 10 5  --     BNP (last 3 results) No results for input(s): PROBNP in the last 8760 hours. CBG: No results for input(s): GLUCAP in the last 168 hours.  Radiological Exams on Admission:  CT Lumbar Spine Wo Contrast Result Date: 04/22/2023 CLINICAL DATA:  Low back pain trauma EXAM: CT LUMBAR SPINE WITHOUT CONTRAST TECHNIQUE: Multidetector CT imaging of the lumbar spine was performed without intravenous contrast administration. Multiplanar CT image reconstructions were also generated. RADIATION DOSE REDUCTION: This exam was performed according to the departmental dose-optimization program which includes automated exposure control, adjustment of the mA and/or kV according to patient size and/or use of iterative reconstruction technique. COMPARISON:  Radiograph 10/23/2019, 06/03/2022, 10/22/2022 FINDINGS: Segmentation: 5 lumbar type vertebrae. Alignment: Straightening of lumbar lordosis. Vertebrae: Mild to moderate chronic superior endplate deformities at L2 and L3. Moderate chronic compression fracture of L1 with about 5 mm retropulsion of the upper vertebral body and estimated 50% loss of vertebral body height  centrally. No definite acute fracture is seen. Paraspinal and other soft tissues: No acute paravertebral or paraspinal soft tissue abnormality. Dense aortic vascular calcifications. Aortoiliac stents are noted. Incompletely visualized low-density lesions in the kidneys, likely cysts, no specific imaging follow-up is recommended. Disc levels: At T12-L1, no canal stenosis. Facet degenerative changes. Patent foramen. At L1-L2, patent disc space. No canal stenosis. Bilateral facet degenerative changes. At L2-L3, no canal stenosis. Facet degenerative changes. Patent foramen. At L3-L4, mild diffuse disc bulge. No canal stenosis. Facet degenerative changes. Patent foramen. At L4-L5, disc space narrowing. Moderate diffuse disc bulge without canal stenosis. Facet degenerative changes. Patent foramen. At L5-S1, disc space narrowing. No canal stenosis. Facet degenerative changes. Patent foramen. IMPRESSION: 1.  No CT evidence for acute osseous abnormality of the lumbar spine. 2. Chronic compression fractures of L1, L2, and L3. 3. Multilevel degenerative changes. 4. Aortic atherosclerosis. Aortic Atherosclerosis (ICD10-I70.0). Electronically Signed   By: Luke Bun M.D.   On: 04/22/2023 19:02   CT CERVICAL SPINE WO CONTRAST Result Date: 04/22/2023 CLINICAL DATA:  Status post trauma. EXAM: CT CERVICAL SPINE WITHOUT CONTRAST TECHNIQUE: Multidetector CT imaging of the cervical spine was performed without intravenous contrast. Multiplanar CT image reconstructions were also generated. RADIATION DOSE REDUCTION: This exam was performed according to the departmental dose-optimization program which includes automated exposure control, adjustment of the mA and/or kV according to patient size and/or use of iterative reconstruction technique. COMPARISON:  Aug 06, 2015 FINDINGS: Alignment: Normal. Skull base and vertebrae: No acute fracture. No primary bone lesion or focal pathologic process. Soft tissues and spinal canal: No  prevertebral fluid or swelling. No visible canal hematoma. Disc levels: Moderate to marked severity multilevel endplate sclerosis is noted throughout all levels of the cervical spine. Mild to moderate severity anterior osteophyte formation is also seen at the levels of C2-C3, C3-C4, C4-C5 and C6-C7. Marked severity anterior osteophyte formation is present at C5-C6. Moderate to marked severity posterior bony spurring is seen at the level of C5-C6. There is marked severity narrowing of the anterior atlantoaxial articulation. Marked severity intervertebral disc space narrowing is seen at C5-C6. Marked severity bilateral multilevel facet joint hypertrophy is noted. Upper chest: Negative. Other: None. IMPRESSION: 1. No acute fracture or subluxation in the cervical spine. 2. Marked severity multilevel degenerative changes, most prominent at the level of C5-C6. Electronically Signed   By: Suzen Dials M.D.   On: 04/22/2023 18:51   CT HEAD WO CONTRAST ( ) Result Date: 04/22/2023 CLINICAL DATA:  Status post trauma. EXAM: CT HEAD WITHOUT CONTRAST TECHNIQUE: Contiguous axial images were obtained from the base of the skull through the vertex without intravenous contrast. RADIATION DOSE REDUCTION: This exam was performed according to the departmental dose-optimization program which includes automated exposure control, adjustment of the mA and/or kV according to patient size and/or use of iterative reconstruction technique. COMPARISON:  Aug 05, 2015 FINDINGS: Brain: There is mild to moderate severity cerebral atrophy with widening of the extra-axial spaces and ventricular dilatation. There are areas of decreased attenuation within the white matter tracts of the supratentorial brain, consistent with microvascular disease changes. Vascular: Moderate to marked severity bilateral cavernous carotid artery calcification is noted. Skull: Chronic bilateral nondisplaced nasal bone fractures are seen. Sinuses/Orbits: No acute  finding. Other: None. IMPRESSION: 1. No acute intracranial abnormality. 2. Generalized cerebral atrophy with widening of the extra-axial spaces and ventricular dilatation. 3. Chronic bilateral nondisplaced nasal bone fractures. Electronically Signed   By: Suzen Dials M.D.   On: 04/22/2023 18:48   DG Chest Portable 1 View Result Date: 04/22/2023 CLINICAL DATA:  Fall from chair EXAM: PORTABLE PELVIS 1-2 VIEWS; PORTABLE CHEST - 1 VIEW COMPARISON:  06/03/2022 FINDINGS: Heart and mediastinum are normal. No acute abnormality of the lungs. No acute osseous findings. There is no evidence of displaced pelvic fracture or diastasis. No pelvic bone lesions are seen. Nonobstructive pattern of overlying bowel gas. Left common iliac artery stent. IMPRESSION: 1. No acute abnormality of the lungs. 2. No displaced fracture or dislocation of the pelvis. Recommend CT to more sensitively evaluate if there is high clinical suspicion for fracture. Electronically Signed   By: Marolyn JONETTA Jaksch M.D.   On: 04/22/2023 17:33   DG Pelvis Portable Result Date: 04/22/2023  CLINICAL DATA:  Fall from chair EXAM: PORTABLE PELVIS 1-2 VIEWS; PORTABLE CHEST - 1 VIEW COMPARISON:  06/03/2022 FINDINGS: Heart and mediastinum are normal. No acute abnormality of the lungs. No acute osseous findings. There is no evidence of displaced pelvic fracture or diastasis. No pelvic bone lesions are seen. Nonobstructive pattern of overlying bowel gas. Left common iliac artery stent. IMPRESSION: 1. No acute abnormality of the lungs. 2. No displaced fracture or dislocation of the pelvis. Recommend CT to more sensitively evaluate if there is high clinical suspicion for fracture. Electronically Signed   By: Marolyn JONETTA Jaksch M.D.   On: 04/22/2023 17:33     No intake/output data recorded. Total I/O In: 2000 [IV Piggyback:2000] Out: 800 [Urine:800]      Assessment and Plan: * Traumatic rhabdomyolysis (HCC) CK between 1 and 2000.  Still uptrending.  Will treat  with sodium bicarbonate infusion.  Hold off on Crestor  for now.  Daily BMP.  Fall at home, initial encounter Patient has a pattern of chronic falls due to chronic gait abnormality/paraparesis described by patient therefore current fall is not a change in his functional status.  Will request physical therapy evaluation.  Patient does report new back pain/worsening back pain since the fall, which is affecting the lumbar area.  CAT scan in chart.  Will continue with apixaban  therapy for patient's A-fib  Adenocarcinoma of right lung (HCC) Onc note on 03/24/2021:  DIAGNOSIS: Stage IA (T1a, N0, M0) non-small cell lung cancer, adenocarcinoma  diagnosed in April 2016.   PRIOR THERAPY: Status post right VATS with wedge resection of the right middle lobe as well as wedge resection of the right upper lobe and pleural biopsy under the care of Dr. Kerrin on 01/24/2015.   CURRENT THERAPY: Observation.  ______________________________________   Med rec done as follows:  Continue with Coreg  Continue with amlodipine  Continue with folic acid  Omeprazole  changed to pantoprazole  Continue with thiamine . Continue with Cymbalta  Continue with I irbesartan  Epinephrine  injection deferred Patient not taking nicotine  patch Patient not taking furosemide  Crestor  deferred Continue with magnesium  oxide    Advance Care Planning:   Code Status: Prior pateint wishes to be DNR.   Consults: none  Family Communication: patient declined offer  Severity of Illness: The appropriate patient status for this patient is OBSERVATION. Observation status is judged to be reasonable and necessary in order to provide the required intensity of service to ensure the patient's safety. The patient's presenting symptoms, physical exam findings, and initial radiographic and laboratory data in the context of their medical condition is felt to place them at decreased risk for further clinical deterioration. Furthermore, it is  anticipated that the patient will be medically stable for discharge from the hospital within 2 midnights of admission.   Author: Jacqulyn Divine, MD 04/22/2023 11:16 PM  For on call review [email or web address].

## 2023-04-22 NOTE — Assessment & Plan Note (Signed)
 CK between 1 and 2000.  Still uptrending.  Will treat with sodium bicarbonate infusion.  Hold off on Crestor  for now.  Daily BMP.

## 2023-04-22 NOTE — ED Notes (Signed)
 Patient transported to CT

## 2023-04-22 NOTE — ED Notes (Signed)
 ED TO INPATIENT HANDOFF REPORT  ED Nurse Name and Phone #:   S Name/Age/Gender Timothy Wood 83 y.o. male Room/Bed: 002C/002C  Code Status   Code Status: Prior  Home/SNF/Other Home Patient oriented to: self, place, time, and situation Is this baseline? Yes   Triage Complete: Triage complete  Chief Complaint fall  Triage Note Pt via [facility] from home after a mechanical fall this morning at about 7am. He was unable to get up and spent 8 hours in the floor before he was able to get to a phone to call for help. Pt takes eliquis  but denies hitting head, no LOC. Reporting 4/10 lower back pain. VSS en route and at time of arrival. A/O x 4.    Allergies Allergies  Allergen Reactions   Codeine Anaphylaxis   Hydrocodone  Itching    Level of Care/Admitting Diagnosis ED Disposition     ED Disposition  Admit   Condition  --   Comment  The patient appears reasonably stabilized for admission considering the current resources, flow, and capabilities available in the ED at this time, and I doubt any other [facility] requiring further screening and/or treatment in the ED prior to admission is  present.          B Medical/Surgery History Past Medical History:  Diagnosis Date   Adenocarcinoma of lung, stage 1 (HCC)    Anemia    Angiodysplasia of cecum 11/23/2017   Multiple 1-2 mm   Arthritis    back possibly   Blood clotting disorder (HCC)    Cataract    removed bilaterally    Chronic obstructive pulmonary disease (COPD) (HCC)    patient not aware   Clotting disorder (HCC)    PE LUNG, dvt leg    Colon polyps    Depression    Diverticulosis    DVT (deep venous thrombosis) (HCC)    Post op  knee surgery   DVT (deep venous thrombosis) (HCC)    GERD (gastroesophageal reflux disease)    Histoplasmosis    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 diverticulosis also. 11/24/2010: 12 polyps removed - adenomas Anticipate routine repeat colonoscopy Spring 2013 Have recommended he see genetics counselor re ? Polyposis 03/27/2016 14 polyps removed max 15 mm    Pulmonary nodule 06/2006   9 mm right upper obe pulmonary nodule (negative Pet Scan)   PVD (peripheral vascular disease) (HCC)    left leg s/p stent   Sebaceous cyst    neck   Seborrheic keratosis    Seizures (HCC)    4-5 yrs ago had seizures when in ICU- none since this time    Sinusitis    Tobacco abuse    Tremor    Past Surgical History:  Procedure Laterality Date   ANGIOPLASTY / STENTING ILIAC  08/2008   Dr. Serene SANES SURGERY  1975, 1978   x2 lumbar fusion over 30 years ago   BACK SURGERY     COLONOSCOPY  05/11/2006; 07/27/2006;05/05/2007, 11/24/2010  adenomas with 9 polyps index and 2.5 cm cecal polyp removed over 3 exams, last 2009 with APC. diverticulosis also.  2012: 12 adenomas removed, largest 8-10 mm, diverticulosis   CORONARY ANGIOPLASTY WITH STENT PLACEMENT     DESCENDING AORTIC ANEURYSM REPAIR W/ STENT  04/2010   Dr. Serene   ESOPHAGOGASTRODUODENOSCOPY  05/05/2007   GERD - one erosion   EYE SURGERY Bilateral    cataract   KNEE SURGERY  2002   left- cartilage    LUNG BIOPSY     PERCUTANEOUS PINNING Left 06/24/2012   Procedure: Repair Complex Laceration Left Thumb with percutaneous pinning.;  Surgeon: Balinda JAYSON Rogue, MD;  Location: [facility] OR;  Service: Plastics;  Laterality: Left;   THUMB AMPUTATION Left    pinning and index finger   TONSILLECTOMY  1956   TONSILLECTOMY     UPPER GASTROINTESTINAL ENDOSCOPY     VIDEO ASSISTED THORACOSCOPY (VATS)/WEDGE RESECTION  Right 01/24/2015   Procedure: RIGHT VIDEO ASSISTED THORACOSCOPY (VATS)/WEDGE RESECTION;  Surgeon: Elspeth JAYSON Millers, MD;  Location: [facility] OR;  Service: Thoracic;  Laterality: Right;   vocal cord polyps removed       A IV Location/Drains/Wounds Patient Lines/Drains/Airways Status     Active Line/Drains/Airways     Name Placement date Placement time Site Days   Peripheral IV 04/22/23 18 G 1 Left Antecubital 04/22/23  1637  Antecubital  less than 1   Wound / Incision (Open or Dehisced) 08/06/15 Other (Comment) Elbow Right;Posterior covered with foam  08/06/15  0029  Elbow  2816   Wound / Incision (Open or Dehisced) 08/06/15 Laceration Head Right;Lateral 08/06/15  0032  Head  2816   Wound / Incision (Open or Dehisced) 08/06/15 Laceration Hand Right stitches 08/06/15  0036  Hand  2816            Intake/Output Last 24 hours  Intake/Output Summary (Last 24 hours) at 04/22/2023 2210 Last data filed at 04/22/2023 2121 Gross per 24 hour  Intake 2000 ml  Output --  Net 2000 ml    Labs/Imaging Results for orders placed or performed during the hospital encounter of 04/22/23 (from the past 48 hours)  Comprehensive metabolic panel     Status: Abnormal   Collection Time: 04/22/23  4:37 PM  Result Value Ref Range   Sodium 134 (L) 135 - 145 mmol/L   Potassium 3.6 3.5 - 5.1 mmol/L   Chloride 97 (L) 98 - 111 mmol/L   CO2 26 22 - 32 mmol/L   Glucose, Bld 89 70 - 99 mg/dL    Comment: Glucose reference range applies only to samples taken after fasting for at least 8 hours.   BUN 13 8 - 23 mg/dL   Creatinine, Ser 8.88 0.61 - 1.24 mg/dL   Calcium  8.5 (L) 8.9 - 10.3 mg/dL   Total Protein 6.7 6.5 - 8.1 g/dL   Albumin 3.6 3.5 - 5.0 g/dL   AST 48 (H) 15 - 41 U/L   ALT 18 0 - 44 U/L   Alkaline Phosphatase 84 38 - 126 U/L   Total Bilirubin 0.9 0.0 - 1.2 mg/dL   GFR, Estimated >39 >39 mL/min    Comment: (NOTE) Calculated using the CKD-EPI Creatinine Equation (2021)    Anion gap 11 5 - 15     Comment: Performed at [facility], [address], [place], [place] [address]  Troponin I (High Sensitivity)     Status: None   Collection Time: 04/22/23  4:37 PM  Result Value  Ref Range   Troponin I (High Sensitivity) 10 <18 ng/L    Comment: (NOTE) Elevated high sensitivity troponin I (hsTnI) values and significant  changes across serial measurements may suggest ACS but many other  chronic and acute conditions are known to elevate hsTnI results.  Refer to the Links section for chest pain algorithms and additional  guidance. Performed at [facility], [address] [place], [place] [address]   CK     Status: Abnormal   Collection Time: 04/22/23  4:37 PM  Result Value Ref Range   Total CK 1,605 (H) 49 - 397 U/L    Comment: Performed at [facility], [address], [place], [place] [address]  Urinalysis, Routine w reflex microscopic -Urine, Clean Catch     Status: Abnormal   Collection Time: 04/22/23  4:37 PM  Result Value Ref Range   Color, Urine YELLOW YELLOW   APPearance CLEAR CLEAR   Specific Gravity, Urine 1.018 1.005 - 1.030   pH 5.0 5.0 - 8.0   Glucose, UA NEGATIVE NEGATIVE mg/dL   Hgb urine dipstick MODERATE (A) NEGATIVE   Bilirubin Urine NEGATIVE NEGATIVE   Ketones, ur 20 (A) NEGATIVE mg/dL   Protein, ur 30 (A) NEGATIVE mg/dL   Nitrite NEGATIVE NEGATIVE   Leukocytes,Ua NEGATIVE NEGATIVE   RBC / HPF 0-5 0 - 5 RBC/hpf   WBC, UA 0-5 0 - 5 WBC/hpf   Bacteria, UA RARE (A) NONE SEEN   Squamous Epithelial / HPF 0-5 0 - 5 /HPF   Mucus PRESENT     Comment: Performed at [facility], [address], [place], [place] [address]  Troponin I (High Sensitivity)     Status: None   Collection Time: 04/22/23  7:04 PM  Result Value Ref Range   Troponin I (High Sensitivity) 5 <18 ng/L    Comment: (NOTE) Elevated high sensitivity troponin I (hsTnI) values and significant  changes across serial measurements may suggest ACS but many other  chronic and acute  conditions are known to elevate hsTnI results.  Refer to the Links section for chest pain algorithms and additional  guidance. Performed at [facility], [address], [place], [place] [address]   CK     Status: Abnormal   Collection Time: 04/22/23  9:04 PM  Result Value Ref Range   Total CK 1,822 (H) 49 - 397 U/L    Comment: Performed at [facility], [address], [place], [place] [address]   CT Lumbar Spine Wo Contrast Result Date: 04/22/2023 CLINICAL DATA:  Low back pain trauma EXAM: CT LUMBAR SPINE WITHOUT CONTRAST TECHNIQUE: Multidetector CT imaging of the lumbar spine was performed without intravenous contrast administration. Multiplanar CT image reconstructions were also generated. RADIATION DOSE REDUCTION: This exam was performed according to the departmental dose-optimization program which includes automated exposure control, adjustment of the mA and/or kV according to patient size and/or use of iterative reconstruction technique. COMPARISON:  Radiograph 10/23/2019, 06/03/2022, 10/22/2022 FINDINGS: Segmentation: 5 lumbar type vertebrae. Alignment: Straightening of lumbar lordosis. Vertebrae: Mild to moderate chronic superior endplate deformities at L2 and L3. Moderate chronic compression fracture of L1 with about 5 mm retropulsion of the upper vertebral body and estimated 50% loss of vertebral body height centrally. No definite acute fracture is seen. Paraspinal and other soft tissues: No acute paravertebral or paraspinal soft tissue abnormality. Dense aortic vascular calcifications. Aortoiliac stents are noted. Incompletely visualized low-density lesions in the kidneys, likely cysts, no  specific imaging follow-up is recommended. Disc levels: At T12-L1, no canal stenosis. Facet degenerative changes. Patent foramen. At L1-L2, patent disc space. No canal stenosis. Bilateral facet degenerative changes. At L2-L3, no canal stenosis. Facet degenerative changes. Patent foramen. At  L3-L4, mild diffuse disc bulge. No canal stenosis. Facet degenerative changes. Patent foramen. At L4-L5, disc space narrowing. Moderate diffuse disc bulge without canal stenosis. Facet degenerative changes. Patent foramen. At L5-S1, disc space narrowing. No canal stenosis. Facet degenerative changes. Patent foramen. IMPRESSION: 1. No CT evidence for acute osseous abnormality of the lumbar spine. 2. Chronic compression fractures of L1, L2, and L3. 3. Multilevel degenerative changes. 4. Aortic atherosclerosis. Aortic Atherosclerosis (ICD10-I70.0). Electronically Signed   By: Luke Bun M.D.   On: 04/22/2023 19:02   CT CERVICAL SPINE WO CONTRAST Result Date: 04/22/2023 CLINICAL DATA:  Status post trauma. EXAM: CT CERVICAL SPINE WITHOUT CONTRAST TECHNIQUE: Multidetector CT imaging of the cervical spine was performed without intravenous contrast. Multiplanar CT image reconstructions were also generated. RADIATION DOSE REDUCTION: This exam was performed according to the departmental dose-optimization program which includes automated exposure control, adjustment of the mA and/or kV according to patient size and/or use of iterative reconstruction technique. COMPARISON:  Aug 06, 2015 FINDINGS: Alignment: Normal. Skull base and vertebrae: No acute fracture. No primary bone lesion or focal pathologic process. Soft tissues and spinal canal: No prevertebral fluid or swelling. No visible canal hematoma. Disc levels: Moderate to marked severity multilevel endplate sclerosis is noted throughout all levels of the cervical spine. Mild to moderate severity anterior osteophyte formation is also seen at the levels of C2-C3, C3-C4, C4-C5 and C6-C7. Marked severity anterior osteophyte formation is present at C5-C6. Moderate to marked severity posterior bony spurring is seen at the level of C5-C6. There is marked severity narrowing of the anterior atlantoaxial articulation. Marked severity intervertebral disc space narrowing is seen  at C5-C6. Marked severity bilateral multilevel facet joint hypertrophy is noted. Upper chest: Negative. Other: None. IMPRESSION: 1. No acute fracture or subluxation in the cervical spine. 2. Marked severity multilevel degenerative changes, most prominent at the level of C5-C6. Electronically Signed   By: Suzen Dials M.D.   On: 04/22/2023 18:51   CT HEAD WO CONTRAST ( ) Result Date: 04/22/2023 CLINICAL DATA:  Status post trauma. EXAM: CT HEAD WITHOUT CONTRAST TECHNIQUE: Contiguous axial images were obtained from the base of the skull through the vertex without intravenous contrast. RADIATION DOSE REDUCTION: This exam was performed according to the departmental dose-optimization program which includes automated exposure control, adjustment of the mA and/or kV according to patient size and/or use of iterative reconstruction technique. COMPARISON:  Aug 05, 2015 FINDINGS: Brain: There is mild to moderate severity cerebral atrophy with widening of the extra-axial spaces and ventricular dilatation. There are areas of decreased attenuation within the white matter tracts of the supratentorial brain, consistent with microvascular disease changes. Vascular: Moderate to marked severity bilateral cavernous carotid artery calcification is noted. Skull: Chronic bilateral nondisplaced nasal bone fractures are seen. Sinuses/Orbits: No acute finding. Other: None. IMPRESSION: 1. No acute intracranial abnormality. 2. Generalized cerebral atrophy with widening of the extra-axial spaces and ventricular dilatation. 3. Chronic bilateral nondisplaced nasal bone fractures. Electronically Signed   By: Suzen Dials M.D.   On: 04/22/2023 18:48   DG Chest Portable 1 View Result Date: 04/22/2023 CLINICAL DATA:  Fall from chair EXAM: PORTABLE PELVIS 1-2 VIEWS; PORTABLE CHEST - 1 VIEW COMPARISON:  06/03/2022 FINDINGS: Heart and mediastinum are normal. No acute abnormality of the  lungs. No acute osseous findings. There is no  evidence of displaced pelvic fracture or diastasis. No pelvic bone lesions are seen. Nonobstructive pattern of overlying bowel gas. Left common iliac artery stent. IMPRESSION: 1. No acute abnormality of the lungs. 2. No displaced fracture or dislocation of the pelvis. Recommend CT to more sensitively evaluate if there is high clinical suspicion for fracture. Electronically Signed   By: Marolyn JONETTA Jaksch M.D.   On: 04/22/2023 17:33   DG Pelvis Portable Result Date: 04/22/2023 CLINICAL DATA:  Fall from chair EXAM: PORTABLE PELVIS 1-2 VIEWS; PORTABLE CHEST - 1 VIEW COMPARISON:  06/03/2022 FINDINGS: Heart and mediastinum are normal. No acute abnormality of the lungs. No acute osseous findings. There is no evidence of displaced pelvic fracture or diastasis. No pelvic bone lesions are seen. Nonobstructive pattern of overlying bowel gas. Left common iliac artery stent. IMPRESSION: 1. No acute abnormality of the lungs. 2. No displaced fracture or dislocation of the pelvis. Recommend CT to more sensitively evaluate if there is high clinical suspicion for fracture. Electronically Signed   By: Marolyn JONETTA Jaksch M.D.   On: 04/22/2023 17:33    Pending Labs Unresulted Labs (From admission, onward)    None       Vitals/Pain Today's Vitals   04/22/23 1800 04/22/23 1930 04/22/23 2100 04/22/23 2130  BP: (!) 142/98 (!) 176/83 (!) 169/89 (!) 167/88  Pulse: 86 80 84 87  Resp: 20 (!) 23 (!) 26 (!) 24  Temp:  99.2 F (37.3 C)    TempSrc:  Oral    SpO2: 96% 100% 91% 95%  Weight:      Height:      PainSc:        Isolation Precautions No active isolations  Medications Medications  lactated ringers  bolus 1,000 mL (0 mLs Intravenous Stopped 04/22/23 2023)  lactated ringers  bolus 1,000 mL (0 mLs Intravenous Stopped 04/22/23 2121)    Mobility walks with device     Focused Assessments     R Recommendations: See Admitting Provider Note  Report given to:   Additional Notes:

## 2023-04-22 NOTE — Assessment & Plan Note (Signed)
 Onc note on 03/24/2021:  DIAGNOSIS: Stage IA (T1a, N0, M0) non-small cell lung cancer, adenocarcinoma  diagnosed in April 2016.   PRIOR THERAPY: Status post right VATS with wedge resection of the right middle lobe as well as wedge resection of the right upper lobe and pleural biopsy under the care of Dr. Kerrin on 01/24/2015.   CURRENT THERAPY: Observation.

## 2023-04-23 ENCOUNTER — Encounter: Payer: Medicare HMO | Attending: Physical Medicine & Rehabilitation | Admitting: Physical Medicine & Rehabilitation

## 2023-04-23 DIAGNOSIS — Y92009 Unspecified place in unspecified non-institutional (private) residence as the place of occurrence of the external cause: Secondary | ICD-10-CM

## 2023-04-23 DIAGNOSIS — D689 Coagulation defect, unspecified: Secondary | ICD-10-CM | POA: Diagnosis present

## 2023-04-23 DIAGNOSIS — I480 Paroxysmal atrial fibrillation: Secondary | ICD-10-CM | POA: Diagnosis present

## 2023-04-23 DIAGNOSIS — C3491 Malignant neoplasm of unspecified part of right bronchus or lung: Secondary | ICD-10-CM | POA: Diagnosis present

## 2023-04-23 DIAGNOSIS — E876 Hypokalemia: Secondary | ICD-10-CM | POA: Diagnosis present

## 2023-04-23 DIAGNOSIS — Z860101 Personal history of adenomatous and serrated colon polyps: Secondary | ICD-10-CM | POA: Diagnosis not present

## 2023-04-23 DIAGNOSIS — K219 Gastro-esophageal reflux disease without esophagitis: Secondary | ICD-10-CM | POA: Diagnosis present

## 2023-04-23 DIAGNOSIS — T796XXA Traumatic ischemia of muscle, initial encounter: Secondary | ICD-10-CM | POA: Diagnosis present

## 2023-04-23 DIAGNOSIS — W1830XA Fall on same level, unspecified, initial encounter: Secondary | ICD-10-CM | POA: Diagnosis present

## 2023-04-23 DIAGNOSIS — I10 Essential (primary) hypertension: Secondary | ICD-10-CM | POA: Diagnosis present

## 2023-04-23 DIAGNOSIS — Z7901 Long term (current) use of anticoagulants: Secondary | ICD-10-CM | POA: Diagnosis not present

## 2023-04-23 DIAGNOSIS — Z83719 Family history of colon polyps, unspecified: Secondary | ICD-10-CM | POA: Diagnosis not present

## 2023-04-23 DIAGNOSIS — G8220 Paraplegia, unspecified: Secondary | ICD-10-CM | POA: Diagnosis present

## 2023-04-23 DIAGNOSIS — J449 Chronic obstructive pulmonary disease, unspecified: Secondary | ICD-10-CM | POA: Diagnosis present

## 2023-04-23 DIAGNOSIS — F17210 Nicotine dependence, cigarettes, uncomplicated: Secondary | ICD-10-CM | POA: Diagnosis present

## 2023-04-23 DIAGNOSIS — Z66 Do not resuscitate: Secondary | ICD-10-CM | POA: Diagnosis present

## 2023-04-23 DIAGNOSIS — W19XXXA Unspecified fall, initial encounter: Secondary | ICD-10-CM | POA: Diagnosis not present

## 2023-04-23 DIAGNOSIS — Y92000 Kitchen of unspecified non-institutional (private) residence as  the place of occurrence of the external cause: Secondary | ICD-10-CM | POA: Diagnosis not present

## 2023-04-23 DIAGNOSIS — E7800 Pure hypercholesterolemia, unspecified: Secondary | ICD-10-CM | POA: Diagnosis present

## 2023-04-23 DIAGNOSIS — R748 Abnormal levels of other serum enzymes: Secondary | ICD-10-CM | POA: Diagnosis not present

## 2023-04-23 DIAGNOSIS — Z8249 Family history of ischemic heart disease and other diseases of the circulatory system: Secondary | ICD-10-CM | POA: Diagnosis not present

## 2023-04-23 DIAGNOSIS — R531 Weakness: Secondary | ICD-10-CM | POA: Diagnosis present

## 2023-04-23 DIAGNOSIS — Z801 Family history of malignant neoplasm of trachea, bronchus and lung: Secondary | ICD-10-CM | POA: Diagnosis not present

## 2023-04-23 DIAGNOSIS — Z8261 Family history of arthritis: Secondary | ICD-10-CM | POA: Diagnosis not present

## 2023-04-23 DIAGNOSIS — Z955 Presence of coronary angioplasty implant and graft: Secondary | ICD-10-CM | POA: Diagnosis not present

## 2023-04-23 DIAGNOSIS — I739 Peripheral vascular disease, unspecified: Secondary | ICD-10-CM | POA: Diagnosis present

## 2023-04-23 DIAGNOSIS — Z833 Family history of diabetes mellitus: Secondary | ICD-10-CM | POA: Diagnosis not present

## 2023-04-23 DIAGNOSIS — F32A Depression, unspecified: Secondary | ICD-10-CM | POA: Diagnosis present

## 2023-04-23 DIAGNOSIS — R296 Repeated falls: Secondary | ICD-10-CM | POA: Diagnosis present

## 2023-04-23 LAB — BASIC METABOLIC PANEL
Anion gap: 11 (ref 5–15)
BUN: 11 mg/dL (ref 8–23)
CO2: 26 mmol/L (ref 22–32)
Calcium: 8.4 mg/dL — ABNORMAL LOW (ref 8.9–10.3)
Chloride: 98 mmol/L (ref 98–111)
Creatinine, Ser: 0.93 mg/dL (ref 0.61–1.24)
GFR, Estimated: >60 mL/min (ref >60)
Glucose, Bld: 77 mg/dL (ref 70–99)
Potassium: 3.4 mmol/L — ABNORMAL LOW (ref 3.5–5.1)
Sodium: 135 mmol/L (ref 135–145)

## 2023-04-23 LAB — PROTIME-INR
INR: 1.2 (ref 0.8–1.2)
Prothrombin Time: 15.4 s — ABNORMAL HIGH (ref 11.4–15.2)

## 2023-04-23 LAB — CK
Total CK: 1636 U/L — ABNORMAL HIGH (ref 49–397)
Total CK: 1107 U/L — ABNORMAL HIGH (ref 49–397)

## 2023-04-23 LAB — APTT: aPTT: 38 s — ABNORMAL HIGH (ref 24–36)

## 2023-04-23 LAB — MAGNESIUM: Magnesium: 1.5 mg/dL — ABNORMAL LOW (ref 1.7–2.4)

## 2023-04-23 MED ORDER — LACTATED RINGERS IV BOLUS
1000.0000 mL | Freq: Once | INTRAVENOUS | Status: AC
  Administered 2023-04-23: 1000 mL via INTRAVENOUS

## 2023-04-23 MED ORDER — NICOTINE 21 MG/24HR TD PT24
21.0000 mg | MEDICATED_PATCH | Freq: Every day | TRANSDERMAL | Status: DC
  Administered 2023-04-23 – 2023-04-27 (×5): 21 mg via TRANSDERMAL
  Filled 2023-04-23 (×5): qty 1

## 2023-04-23 MED ORDER — DEXTROSE-SODIUM CHLORIDE 5-0.45 % IV SOLN: INTRAVENOUS | Status: AC

## 2023-04-23 MED ORDER — MAGNESIUM SULFATE 2 GM/50ML IV SOLN
2.0000 g | Freq: Once | INTRAVENOUS | Status: AC
  Administered 2023-04-23: 2 g via INTRAVENOUS
  Filled 2023-04-23: qty 50

## 2023-04-23 MED ORDER — POTASSIUM CHLORIDE CRYS ER 20 MEQ PO TBCR
40.0000 meq | EXTENDED_RELEASE_TABLET | Freq: Two times a day (BID) | ORAL | Status: AC
  Administered 2023-04-23 – 2023-04-24 (×4): 40 meq via ORAL
  Filled 2023-04-23 (×4): qty 2

## 2023-04-23 NOTE — NC FL2 (Signed)
 [place]  MEDICAID FL2 LEVEL OF CARE FORM     IDENTIFICATION  Patient Name: Timothy Wood Birthdate: 11/19/1940 Sex: male Admission Date (Current Location): 04/22/2023  [facility] and [place] Number:  Producer, Television/film/video and Address:  [facility] [place], [address], [place], [place] [address]      Provider Number: [id number]  Attending Physician Name and Address:  Raenelle Coria, MD  Relative Name and Phone Number:  Rosaline Lever; Daughter; [phone]    Current Level of Care: Hospital Recommended Level of Care: Skilled Nursing Facility Prior Approval Number:    Date Approved/Denied:   PASRR Number: [id number]  Discharge Plan: SNF    Current Diagnoses: Patient Active Problem List   Diagnosis Date Noted   Traumatic rhabdomyolysis (HCC) 04/22/2023   Rhabdomyolysis 04/22/2023   Alcohol dependence, uncomplicated (HCC) 07/01/2022   Hypomagnesemia 07/01/2022   Rash and nonspecific skin eruption 07/01/2022   Pulmonary embolism (HCC) 06/04/2022   Fall at home, initial encounter 06/03/2022   Leukocytosis 06/03/2022   Hypocalcemia 06/03/2022   History of DVT (deep vein thrombosis) 06/03/2022   Aortic atherosclerosis (HCC) 02/14/2021   Vitamin D deficiency, unspecified 04/18/2019   History of colonic polyps 02/01/2019   Screening for viral disease 02/01/2019   Nightmare disorder 08/12/2018   Chronic anticoagulation 04/04/2018   Angiodysplasia of cecum 11/23/2017   Actinic keratoses 10/01/2017   Unstable gait 06/08/2016   Genetic testing 04/29/2016   Ganglion of right wrist 04/28/2016   Depression, major, in partial remission (HCC) 04/16/2016   Encounter for therapeutic drug monitoring 08/16/2015   Hand laceration    Closed compression fracture of L1 vertebra (HCC)    Hx of pulmonary emboli (HCC) 08/05/2015   Lung cancer (HCC) 08/05/2015   Syncope 08/05/2015   CAD (coronary artery disease) 08/05/2015   Kidney disease 08/05/2015   Bone  lesion 08/05/2015   Prostate hypertrophy 08/05/2015   Hx of pulmonary embolus 08/05/2015   Complicated grieving 05/15/2015   Adenocarcinoma of right lung (HCC) 01/24/2015   Coronary atherosclerosis due to calcified coronary lesion of native artery 01/14/2015   Tremor 09/19/2014   COPD GOLD I 03/03/2013   Grieving 02/06/2013   Abnormal prostate on physical examination 08/01/2012   Polyposis coli - attenuated 09/12/2010   Peripheral vascular disease (HCC) 06/11/2008   Hyperlipemia, mixed 03/02/2008   SEBORRHEIC KERATOSIS 03/02/2008   BACK PAIN, LUMBAR, CHRONIC 09/30/2007   TOBACCO ABUSE 08/19/2007   Essential hypertension 08/19/2007   Multiple pulmonary nodules 08/19/2007    Orientation RESPIRATION BLADDER Height & Weight     Self, Time, Situation, Place  Normal (Room Air) Continent Weight: 160 lb (72.6 kg) Height:  5' 8 (172.7 cm)  BEHAVIORAL SYMPTOMS/MOOD NEUROLOGICAL BOWEL NUTRITION STATUS    Convulsions/Seizures Continent Diet (Please see dc summary)  AMBULATORY STATUS COMMUNICATION OF NEEDS Skin   Limited Assist Verbally Normal                       Personal Care Assistance Level of Assistance  Dressing, Feeding, Bathing Bathing Assistance: Limited assistance Feeding assistance: Limited assistance Dressing Assistance: Limited assistance     Functional Limitations Info  Sight, Hearing Sight Info: Impaired (R and L (Eyeglasses)) Hearing Info: Impaired (R and L)      SPECIAL CARE FACTORS FREQUENCY  PT (By licensed PT), OT (By licensed OT)     PT Frequency: 5x OT Frequency: 5x            Contractures Contractures  Info: Not present    Additional Factors Info  Code Status Code Status Info: DNR- Prearrest Interventions Desired             Current Medications (04/23/2023):  This is the current hospital active medication list Current Facility-Administered Medications  Medication Dose Route Frequency Provider Last Rate Last Admin   acetaminophen   (TYLENOL ) tablet 650 mg  650 mg Oral Q6H PRN Moody Alto, MD       Or   acetaminophen  (TYLENOL ) suppository 650 mg  650 mg Rectal Q6H PRN Moody Alto, MD       amLODipine  (NORVASC ) tablet 5 mg  5 mg Oral Daily Goel, Hersh, MD   5 mg at 04/23/23 1010   apixaban  (ELIQUIS ) tablet 5 mg  5 mg Oral BID Goel, Hersh, MD   5 mg at 04/23/23 1011   carvedilol  (COREG ) tablet 6.25 mg  6.25 mg Oral BID WC Goel, Hersh, MD   6.25 mg at 04/23/23 1010   dextrose  5 % and 0.45 % NaCl infusion   Intravenous Continuous Raenelle Coria, MD       DULoxetine  (CYMBALTA ) DR capsule 60 mg  60 mg Oral Daily Goel, Hersh, MD   60 mg at 04/23/23 1010   folic acid  (FOLVITE ) tablet 1 mg  1 mg Oral Daily Goel, Hersh, MD   1 mg at 04/23/23 1010   irbesartan  (AVAPRO ) tablet 150 mg  150 mg Oral Daily Goel, Hersh, MD   150 mg at 04/23/23 1015   magnesium  oxide (MAG-OX) tablet 400 mg  400 mg Oral Daily Goel, Hersh, MD   400 mg at 04/23/23 1010   nicotine  (NICODERM CQ  - dosed in mg/24 hours) patch 21 mg  21 mg Transdermal Daily Ghimire, Kuber, MD   21 mg at 04/23/23 1010   pantoprazole  (PROTONIX ) EC tablet 40 mg  40 mg Oral Daily Goel, Hersh, MD   40 mg at 04/23/23 1010   polyethylene glycol (MIRALAX  / GLYCOLAX ) packet 17 g  17 g Oral Daily PRN Moody Alto, MD       potassium chloride  SA (KLOR-CON  M) CR tablet 40 mEq  40 mEq Oral BID Ghimire, Kuber, MD   40 mEq at 04/23/23 1010   sodium chloride  flush (NS) 0.9 % injection 3 mL  3 mL Intravenous Q12H Moody Alto, MD   3 mL at 04/23/23 1021   thiamine  (VITAMIN B1) tablet 100 mg  100 mg Oral Daily Goel, Hersh, MD   100 mg at 04/23/23 1010     Discharge Medications: Please see discharge summary for a list of discharge medications.  Relevant Imaging Results:  Relevant Lab Results:   Additional Information ss#[id number].  Lauraine FORBES Saa, LCSW

## 2023-04-23 NOTE — Progress Notes (Signed)
 PROGRESS NOTE    Timothy Wood  FMW:[id number] DOB: 10-20-1940 DOA: 04/22/2023 PCP: Jordan, [name], MD    Brief Narrative:  83 year old with history of chronic back pain, chronic lower extremity weaknesses independent at home presented to the ER with falling at home and unable to get back up.  In the emergency room hemodynamically stable.  CK level elevated to 2000.  Skeletal survey negative.  Subjective: Patient seen and examined.  Denies any complaints.  He needed a lot of support to get out of the bed to the chair.  Does not want to go to nursing home but agreeable to go to short-term rehab.  Assessment & Plan:   Ground-level fall/traumatic rhabdomyolysis: Treated with IV fluids.  Skeletal survey negative.  CK level appropriately improving.  Additional 1 L of Ringer lactate today and maintenance fluid.  Check urine output.  Repeat CK level tomorrow morning. Recurrent fall.  Work with PT OT.  Referred to short-term rehab at SNF.  Hypokalemia/hypomagnesemia: Replace aggressively and monitor levels.  Recheck tomorrow.  Paroxysmal A-fib: Currently sinus rhythm.  On Coreg .  Therapeutic on Eliquis .  Will continue.  Chronic medical issues including Essential hypertension, stable on Coreg , irbesartan  and amlodipine . GERD, on PPI.  Smoker: Smokes about 2 packs/day.  He tells me he is 83 year old and unlikely able to quit.  Nicotine  patch while in the hospital.    DVT prophylaxis:  apixaban  (ELIQUIS ) tablet 5 mg   Code Status: DNR with full scope of treatment Family Communication: None at the bedside Disposition Plan: Status is: Observation The patient will require care spanning > 2 midnights and should be moved to inpatient because: Ongoing IV fluids, SNF referral     Consultants:  None  Procedures:  None  Antimicrobials:  None     Objective: Vitals:   04/23/23 0000 04/23/23 0049 04/23/23 0513 04/23/23 0824  BP: (!) 173/87 (!) 173/91 (!) 149/67 (!) 149/83  Pulse:  79 65 74 91  Resp: 19  18 18   Temp:  98.4 F (36.9 C) 98.4 F (36.9 C)   TempSrc:  Oral Oral   SpO2: 96% 96% 97% 92%  Weight:      Height:        Intake/Output Summary (Last 24 hours) at 04/23/2023 1322 Last data filed at 04/23/2023 1200 Gross per 24 hour  Intake 2329.76 ml  Output 1700 ml  Net 629.76 ml   Filed Weights   04/22/23 1606  Weight: 72.6 kg    Examination:  General exam: Appears calm and comfortable  Respiratory system: Clear to auscultation. Respiratory effort normal. Cardiovascular system: S1 & S2 heard, RRR.  Gastrointestinal system: Soft and nontender. Central nervous system: Alert and oriented. No focal neurological deficits. Extremities: Symmetric 5 x 5 power. Generalized weakness.    Data Reviewed: I have personally reviewed following labs and imaging studies  CBC: No results for input(s): WBC, NEUTROABS, HGB, HCT, MCV, PLT in the last 168 hours. Basic Metabolic Panel: Recent Labs  Lab 04/22/23 1637 04/23/23 0109  NA 134* 135  K 3.6 3.4*  CL 97* 98  CO2 26 26  GLUCOSE 89 77  BUN 13 11  CREATININE 1.11 0.93  CALCIUM  8.5* 8.4*  MG  --  1.5*   GFR: Estimated Creatinine Clearance: 59.2 mL/min (by C-G formula based on SCr of 0.93 mg/dL). Liver Function Tests: Recent Labs  Lab 04/22/23 1637  AST 48*  ALT 18  ALKPHOS 84  BILITOT 0.9  PROT 6.7  ALBUMIN 3.6  No results for input(s): LIPASE, AMYLASE in the last 168 hours. No results for input(s): AMMONIA in the last 168 hours. Coagulation Profile: Recent Labs  Lab 04/23/23 0109  INR 1.2   Cardiac Enzymes: Recent Labs  Lab 04/22/23 1637 04/22/23 2104 04/23/23 0109 04/23/23 0945  CKTOTAL 1,605* 1,822* 1,636* 1,107*   BNP (last 3 results) No results for input(s): PROBNP in the last 8760 hours. HbA1C: No results for input(s): HGBA1C in the last 72 hours. CBG: No results for input(s): GLUCAP in the last 168 hours. Lipid Profile: No results for  input(s): CHOL, HDL, LDLCALC, TRIG, CHOLHDL, LDLDIRECT in the last 72 hours. Thyroid  Function Tests: No results for input(s): TSH, T4TOTAL, FREET4, T3FREE, THYROIDAB in the last 72 hours. Anemia Panel: No results for input(s): VITAMINB12, FOLATE, FERRITIN, TIBC, IRON, RETICCTPCT in the last 72 hours. Sepsis Labs: No results for input(s): PROCALCITON, LATICACIDVEN in the last 168 hours.  No results found for this or any previous visit (from the past 240 hours).       Radiology Studies: CT Lumbar Spine Wo Contrast Result Date: 04/22/2023 CLINICAL DATA:  Low back pain trauma EXAM: CT LUMBAR SPINE WITHOUT CONTRAST TECHNIQUE: Multidetector CT imaging of the lumbar spine was performed without intravenous contrast administration. Multiplanar CT image reconstructions were also generated. RADIATION DOSE REDUCTION: This exam was performed according to the departmental dose-optimization program which includes automated exposure control, adjustment of the mA and/or kV according to patient size and/or use of iterative reconstruction technique. COMPARISON:  Radiograph 10/23/2019, 06/03/2022, 10/22/2022 FINDINGS: Segmentation: 5 lumbar type vertebrae. Alignment: Straightening of lumbar lordosis. Vertebrae: Mild to moderate chronic superior endplate deformities at L2 and L3. Moderate chronic compression fracture of L1 with about 5 mm retropulsion of the upper vertebral body and estimated 50% loss of vertebral body height centrally. No definite acute fracture is seen. Paraspinal and other soft tissues: No acute paravertebral or paraspinal soft tissue abnormality. Dense aortic vascular calcifications. Aortoiliac stents are noted. Incompletely visualized low-density lesions in the kidneys, likely cysts, no specific imaging follow-up is recommended. Disc levels: At T12-L1, no canal stenosis. Facet degenerative changes. Patent foramen. At L1-L2, patent disc space. No canal stenosis.  Bilateral facet degenerative changes. At L2-L3, no canal stenosis. Facet degenerative changes. Patent foramen. At L3-L4, mild diffuse disc bulge. No canal stenosis. Facet degenerative changes. Patent foramen. At L4-L5, disc space narrowing. Moderate diffuse disc bulge without canal stenosis. Facet degenerative changes. Patent foramen. At L5-S1, disc space narrowing. No canal stenosis. Facet degenerative changes. Patent foramen. IMPRESSION: 1. No CT evidence for acute osseous abnormality of the lumbar spine. 2. Chronic compression fractures of L1, L2, and L3. 3. Multilevel degenerative changes. 4. Aortic atherosclerosis. Aortic Atherosclerosis (ICD10-I70.0). Electronically Signed   By: Luke Bun M.D.   On: 04/22/2023 19:02   CT CERVICAL SPINE WO CONTRAST Result Date: 04/22/2023 CLINICAL DATA:  Status post trauma. EXAM: CT CERVICAL SPINE WITHOUT CONTRAST TECHNIQUE: Multidetector CT imaging of the cervical spine was performed without intravenous contrast. Multiplanar CT image reconstructions were also generated. RADIATION DOSE REDUCTION: This exam was performed according to the departmental dose-optimization program which includes automated exposure control, adjustment of the mA and/or kV according to patient size and/or use of iterative reconstruction technique. COMPARISON:  Aug 06, 2015 FINDINGS: Alignment: Normal. Skull base and vertebrae: No acute fracture. No primary bone lesion or focal pathologic process. Soft tissues and spinal canal: No prevertebral fluid or swelling. No visible canal hematoma. Disc levels: Moderate to marked severity multilevel endplate sclerosis is  noted throughout all levels of the cervical spine. Mild to moderate severity anterior osteophyte formation is also seen at the levels of C2-C3, C3-C4, C4-C5 and C6-C7. Marked severity anterior osteophyte formation is present at C5-C6. Moderate to marked severity posterior bony spurring is seen at the level of C5-C6. There is marked severity  narrowing of the anterior atlantoaxial articulation. Marked severity intervertebral disc space narrowing is seen at C5-C6. Marked severity bilateral multilevel facet joint hypertrophy is noted. Upper chest: Negative. Other: None. IMPRESSION: 1. No acute fracture or subluxation in the cervical spine. 2. Marked severity multilevel degenerative changes, most prominent at the level of C5-C6. Electronically Signed   By: Suzen Dials M.D.   On: 04/22/2023 18:51   CT HEAD WO CONTRAST ( ) Result Date: 04/22/2023 CLINICAL DATA:  Status post trauma. EXAM: CT HEAD WITHOUT CONTRAST TECHNIQUE: Contiguous axial images were obtained from the base of the skull through the vertex without intravenous contrast. RADIATION DOSE REDUCTION: This exam was performed according to the departmental dose-optimization program which includes automated exposure control, adjustment of the mA and/or kV according to patient size and/or use of iterative reconstruction technique. COMPARISON:  Aug 05, 2015 FINDINGS: Brain: There is mild to moderate severity cerebral atrophy with widening of the extra-axial spaces and ventricular dilatation. There are areas of decreased attenuation within the white matter tracts of the supratentorial brain, consistent with microvascular disease changes. Vascular: Moderate to marked severity bilateral cavernous carotid artery calcification is noted. Skull: Chronic bilateral nondisplaced nasal bone fractures are seen. Sinuses/Orbits: No acute finding. Other: None. IMPRESSION: 1. No acute intracranial abnormality. 2. Generalized cerebral atrophy with widening of the extra-axial spaces and ventricular dilatation. 3. Chronic bilateral nondisplaced nasal bone fractures. Electronically Signed   By: Suzen Dials M.D.   On: 04/22/2023 18:48   DG Chest Portable 1 View Result Date: 04/22/2023 CLINICAL DATA:  Fall from chair EXAM: PORTABLE PELVIS 1-2 VIEWS; PORTABLE CHEST - 1 VIEW COMPARISON:  06/03/2022 FINDINGS:  Heart and mediastinum are normal. No acute abnormality of the lungs. No acute osseous findings. There is no evidence of displaced pelvic fracture or diastasis. No pelvic bone lesions are seen. Nonobstructive pattern of overlying bowel gas. Left common iliac artery stent. IMPRESSION: 1. No acute abnormality of the lungs. 2. No displaced fracture or dislocation of the pelvis. Recommend CT to more sensitively evaluate if there is high clinical suspicion for fracture. Electronically Signed   By: Marolyn JONETTA Jaksch M.D.   On: 04/22/2023 17:33   DG Pelvis Portable Result Date: 04/22/2023 CLINICAL DATA:  Fall from chair EXAM: PORTABLE PELVIS 1-2 VIEWS; PORTABLE CHEST - 1 VIEW COMPARISON:  06/03/2022 FINDINGS: Heart and mediastinum are normal. No acute abnormality of the lungs. No acute osseous findings. There is no evidence of displaced pelvic fracture or diastasis. No pelvic bone lesions are seen. Nonobstructive pattern of overlying bowel gas. Left common iliac artery stent. IMPRESSION: 1. No acute abnormality of the lungs. 2. No displaced fracture or dislocation of the pelvis. Recommend CT to more sensitively evaluate if there is high clinical suspicion for fracture. Electronically Signed   By: Marolyn JONETTA Jaksch M.D.   On: 04/22/2023 17:33        Scheduled Meds:  amLODipine   5 mg Oral Daily   apixaban   5 mg Oral BID   carvedilol   6.25 mg Oral BID WC   DULoxetine   60 mg Oral Daily   folic acid   1 mg Oral Daily   irbesartan   150 mg Oral Daily  magnesium  oxide  400 mg Oral Daily   nicotine   21 mg Transdermal Daily   pantoprazole   40 mg Oral Daily   potassium chloride   40 mEq Oral BID   sodium chloride  flush  3 mL Intravenous Q12H   thiamine   100 mg Oral Daily   Continuous Infusions:  dextrose  5 % and 0.45 % NaCl       LOS: 0 days    Time spent: 40 minutes    Renato Applebaum, MD [facility]

## 2023-04-23 NOTE — Evaluation (Signed)
 Physical Therapy Evaluation Patient Details Name: Timothy Wood MRN: [id number] DOB: June 08, 1940 Today's Date: 04/23/2023  History of Present Illness  The pt is an 83 yo male presenting 2/6 after a fall at home where he spent 8 hours on the floor before being able to call for help. Admitted for management of traumatic rhabdomyolysis. Imaging showed no acute injury, chronic compression fx of L1, L2, and L3. PMH includes: anemia, arthritis, COPD, PE and DVT, HLD, HTN, lung cancer, and back surgery x2.   Clinical Impression  Pt in bed upon arrival of PT, agreeable to evaluation at this time. Prior to admission the pt was living home alone, in a 1 story home, reports his daughter checks on him 1x/week but otherwise he is typically independent without use of DME, driving, managing his mediations and home without issue. He does report a fall 2 weeks ago where he was down for multiple days before he was able to get up, after which he did not seek medical care and he has had increased difficulty with ambulation since then. The pt now requires minA for bed mobility (due to back pain and poor balance) and CGA for sit-stand transfers. However, he demos poor standing balance both statically and dynamically, walking with wide BOS and short shuffling steps and reaching for UE support on furniture and rails. Discussed use of RW with the pt and although he was initially against the idea, seemed more open to it by the end of the session. Given lack of supervision or assistance available after d/c, I recommend continued inpatient rehab <3hours/day to facilitate return to safer independence prior to return home alone. If pt declines, would benefit from max [facility] services and assist as well as some type of fall notification system as he has had 2 falls with significant time down before being found in last 2 weeks.      If plan is discharge home, recommend the following: A little help with walking and/or transfers;A little  help with bathing/dressing/bathroom;Assistance with cooking/housework;Help with stairs or ramp for entrance;Assist for transportation   Can travel by private vehicle   Yes    Equipment Recommendations None recommended by PT (pt has needed DME)  Recommendations for Other Services  OT consult    Functional Status Assessment Patient has had a recent decline in their functional status and demonstrates the ability to make significant improvements in function in a reasonable and predictable amount of time.     Precautions / Restrictions Precautions Precautions: Fall Precaution Comments: multiple recent falls Restrictions Weight Bearing Restrictions Per Provider Order: No      Mobility  Bed Mobility Overal bed mobility: Needs Assistance Bed Mobility: Supine to Sit     Supine to sit: Min assist     General bed mobility comments: minA with cues for log roll, repeated cues for scooting    Transfers Overall transfer level: Needs assistance   Transfers: Sit to/from Stand Sit to Stand: Contact guard assist           General transfer comment: pt standing without use of DME, CGA for safety, pt bracing legs on bed. wide BOS    Ambulation/Gait Ambulation/Gait assistance: Min assist, Contact guard assist Gait Distance (Feet): 120 Feet Assistive device: None (hallway rail) Gait Pattern/deviations: Step-to pattern, Decreased stride length, Shuffle, Trunk flexed, Wide base of support Gait velocity: decreased Gait velocity interpretation: <1.31 ft/sec, indicative of household ambulator   General Gait Details: wide BOS with shufling steps, absent heel strike bilaterally with  increased sway. reaching for UE support (hallway rail) but declining use fo RW initially     Balance Overall balance assessment: Needs assistance, History of Falls Sitting-balance support: No upper extremity supported, Feet unsupported Sitting balance-Leahy Scale: Poor Sitting balance - Comments: falling  posteriorly with LE movements   Standing balance support: Single extremity supported, During functional activity Standing balance-Leahy Scale: Poor Standing balance comment: small shuffling steps, wide BOS, reaching for UE support                             Pertinent Vitals/Pain Pain Assessment Pain Assessment: No/denies pain    Home Living Family/patient expects to be discharged to:: Private residence Living Arrangements: Alone Available Help at Discharge: Family;Available PRN/intermittently (daughter 1x/week) Type of Home: House Home Access: Stairs to enter Entrance Stairs-Rails: None Entrance Stairs-Number of Steps: 1   Home Layout: One level Home Equipment: Rollator (4 wheels);Shower seat;Grab bars - tub/shower      Prior Function Prior Level of Function : Independent/Modified Independent;Driving;History of Falls (last six months)             Mobility Comments: no DME use, sits in rollator when hed hurting, mostly sedentary ADLs Comments: Indep with ADLs, IADLs, shopping and driving. Pt reports using shopping cart due to difficulty walking long distances     Extremity/Trunk Assessment   Upper Extremity Assessment Upper Extremity Assessment: Overall WFL for tasks assessed    Lower Extremity Assessment Lower Extremity Assessment: Overall WFL for tasks assessed (poor power, but grossly 4+/5 to MMT. reports sensation intact)    Cervical / Trunk Assessment Cervical / Trunk Assessment: Normal  Communication   Communication Communication: No apparent difficulties Cueing Techniques: Verbal cues  Cognition Arousal: Alert Behavior During Therapy: WFL for tasks assessed/performed Overall Cognitive Status: Impaired/Different from baseline Area of Impairment: Safety/judgement, Awareness                         Safety/Judgement: Decreased awareness of safety     General Comments: Pt reports recent falls with decline in functional status without  seeking help or healthcare, no supervision or increased assistance despite decline in abilities        General Comments General comments (skin integrity, edema, etc.): VSS on RA        Assessment/Plan    PT Assessment Patient needs continued PT services  PT Problem List Decreased strength;Decreased activity tolerance;Decreased balance;Decreased mobility;Decreased safety awareness       PT Treatment Interventions DME instruction;Gait training;Stair training;Functional mobility training;Therapeutic activities;Balance training;Therapeutic exercise;Patient/family education    PT Goals (Current goals can be found in the Care Plan section)  Acute Rehab PT Goals Patient Stated Goal: return to independence PT Goal Formulation: With patient Time For Goal Achievement: 05/07/23 Potential to Achieve Goals: Good    Frequency Min 1X/week        AM-PAC PT 6 Clicks Mobility  Outcome Measure Help needed turning from your back to your side while in a flat bed without using bedrails?: A Little Help needed moving from lying on your back to sitting on the side of a flat bed without using bedrails?: A Little Help needed moving to and from a bed to a chair (including a wheelchair)?: A Little Help needed standing up from a chair using your arms (e.g., wheelchair or bedside chair)?: A Little Help needed to walk in hospital room?: A Little Help needed climbing 3-5 steps with a  railing? : A Lot 6 Click Score: 17    End of Session Equipment Utilized During Treatment: Gait belt Activity Tolerance: Patient tolerated treatment well Patient left: in chair;with call bell/phone within reach;with chair alarm set Nurse Communication: Mobility status PT Visit Diagnosis: Unsteadiness on feet (R26.81);Other abnormalities of gait and mobility (R26.89);Repeated falls (R29.6)    Time: 9186-9156 PT Time Calculation (min) (ACUTE ONLY): 30 min   Charges:   PT Evaluation $PT Eval Moderate Complexity: 1  Mod PT Treatments $Therapeutic Exercise: 8-22 mins PT General Charges $$ ACUTE PT VISIT: 1 Visit         Izetta Call, PT, DPT   Acute Rehabilitation Department Office [phone] Secure Chat Communication Preferred  Izetta JULIANNA Call 04/23/2023, 9:41 AM

## 2023-04-23 NOTE — TOC Initial Note (Addendum)
 Transition of Care [facility]) - Initial/Assessment Note    Patient Details  Name: Timothy Wood MRN: [id number] Date of Birth: 04/26/1940  Transition of Care [facility]) CM/SW Contact:    Lauraine FORBES Saa, LCSW Phone Number: 04/23/2023, 1:31 PM  Clinical Narrative:                  1:31 PM CSW introduced herself and role to patient at bedside. CSW informed patient of therapy recommendation of discharge to SNF. Patient expressed agreeance with recommendation and interest in SNFs within [facility]. Patient informed CSW of SNF history but was unsure of facility name. CSW offered to call friends/family to inform them of discharge plans. Patient accepted offer and suggested CSW to call patient's daughter, Rosaline. CSW called Rosaline and informed her of expected discharge plan.  Expected Discharge Plan: Skilled Nursing Facility Barriers to Discharge: Continued Medical Work up   Patient Goals and CMS Choice            Expected Discharge Plan and Services In-house Referral: Clinical Social Work   Post Acute Care Choice: Skilled Nursing Facility Living arrangements for the past 2 months: Single Family Home                                      Prior Living Arrangements/Services Living arrangements for the past 2 months: Single Family Home Lives with:: Self Patient language and need for interpreter reviewed:: Yes        Need for Family Participation in Patient Care: Yes (Comment) Care giver support system in place?: Yes (comment)   Criminal Activity/Legal Involvement Pertinent to Current Situation/Hospitalization: No - Comment as needed  Activities of Daily Living   ADL Screening (condition at time of admission) Independently performs ADLs?: Yes (appropriate for developmental age) Is the patient deaf or have difficulty hearing?: Yes Does the patient have difficulty seeing, even when wearing glasses/contacts?: No Does the patient have difficulty concentrating,  remembering, or making decisions?: No  Permission Sought/Granted Permission sought to share information with : Family Supports Permission granted to share information with : No (Contact information on chart)  Share Information with NAME: Rosaline Lever  Permission granted to share info w AGENCY: SNF  Permission granted to share info w Relationship: Daughter  Permission granted to share info w Contact Information: [phone]  Emotional Assessment       Orientation: : Oriented to Self, Oriented to Place, Oriented to  Time, Oriented to Situation Alcohol / Substance Use: Not Applicable Psych Involvement: No (comment)  Admission diagnosis:  Rhabdomyolysis [M62.82] Abnormal CK [R74.8] Fall, initial encounter [W19.XXXA] Patient Active Problem List   Diagnosis Date Noted   Traumatic rhabdomyolysis (HCC) 04/22/2023   Rhabdomyolysis 04/22/2023   Alcohol dependence, uncomplicated (HCC) 07/01/2022   Hypomagnesemia 07/01/2022   Rash and nonspecific skin eruption 07/01/2022   Pulmonary embolism (HCC) 06/04/2022   Fall at home, initial encounter 06/03/2022   Leukocytosis 06/03/2022   Hypocalcemia 06/03/2022   History of DVT (deep vein thrombosis) 06/03/2022   Aortic atherosclerosis (HCC) 02/14/2021   Vitamin D deficiency, unspecified 04/18/2019   History of colonic polyps 02/01/2019   Screening for viral disease 02/01/2019   Nightmare disorder 08/12/2018   Chronic anticoagulation 04/04/2018   Angiodysplasia of cecum 11/23/2017   Actinic keratoses 10/01/2017   Unstable gait 06/08/2016   Genetic testing 04/29/2016   Ganglion of right wrist 04/28/2016   Depression, major, in partial remission (  HCC) 04/16/2016   Encounter for therapeutic drug monitoring 08/16/2015   Hand laceration    Closed compression fracture of L1 vertebra (HCC)    Hx of pulmonary emboli (HCC) 08/05/2015   Lung cancer (HCC) 08/05/2015   Syncope 08/05/2015   CAD (coronary artery disease) 08/05/2015   Kidney  disease 08/05/2015   Bone lesion 08/05/2015   Prostate hypertrophy 08/05/2015   Hx of pulmonary embolus 08/05/2015   Complicated grieving 05/15/2015   Adenocarcinoma of right lung (HCC) 01/24/2015   Coronary atherosclerosis due to calcified coronary lesion of native artery 01/14/2015   Tremor 09/19/2014   COPD GOLD I 03/03/2013   Grieving 02/06/2013   Abnormal prostate on physical examination 08/01/2012   Polyposis coli - attenuated 09/12/2010   Peripheral vascular disease (HCC) 06/11/2008   Hyperlipemia, mixed 03/02/2008   SEBORRHEIC KERATOSIS 03/02/2008   BACK PAIN, LUMBAR, CHRONIC 09/30/2007   TOBACCO ABUSE 08/19/2007   Essential hypertension 08/19/2007   Multiple pulmonary nodules 08/19/2007   PCP:  Jordan, [name], MD Pharmacy:   [facility] [id number] - [address], [place] - [address] [facility] [address] [place] [address] Phone: [phone] Fax: [phone]  [facility] - [place], [place] - [address] [place] [address] Phone: [phone] Fax: [phone]     Social Drivers of Health (SDOH) Social History: SDOH Screenings   Food Insecurity: No Food Insecurity (04/23/2023)  Housing: Low Risk  (04/23/2023)  Transportation Needs: No Transportation Needs (04/23/2023)  Utilities: Not At Risk (04/23/2023)  Alcohol Screen: Low Risk  (05/08/2020)  Depression (PHQ2-9): High Risk (04/14/2023)  Financial Resource Strain: Low Risk  (11/03/2021)  Physical Activity: Inactive (11/03/2021)  Social Connections: Socially Isolated (04/23/2023)  Stress: Stress Concern Present (11/03/2021)  Tobacco Use: High Risk (04/22/2023)   SDOH Interventions:     Readmission Risk Interventions     No data to display

## 2023-04-23 NOTE — Evaluation (Signed)
 Occupational Therapy Evaluation Patient Details Name: Timothy Wood MRN: [id number] DOB: 1940/10/20 Today's Date: 04/23/2023   History of Present Illness The pt is an 83 yo male presenting 2/6 after a fall at home where he spent 8 hours on the floor before being able to call for help. Admitted for management of traumatic rhabdomyolysis. Imaging showed no acute injury, chronic compression fx of L1, L2, and L3. PMH includes: anemia, arthritis, COPD, PE and DVT, HLD, HTN, lung cancer, and back surgery x2.   Clinical Impression   Pt pleasant and conversational, c/o little to no pain, 1/10 to low back. Pt lives alone, daughter visits at least 1X/week, PLOF independent, driving. Pt states he fell 10 days ago and was unable to get out of chair 5-6 days without food or water , did say maybe it could have been fewer days. Pt likely close to baseline, CGA/supervision for safety with mobility and ADLs, cueing for safety using RW consistently as Pt has poor balance and frequent LOB when standing unsupported. Pt reports he has rollator at home that he uses. Pt agreeable to postacute rehab <3hrs/day, recommended due to lack of support at home and high fall risk, will continue to follow acutely to progress strength, balance, and safety awareness as able.       If plan is discharge home, recommend the following: A little help with walking and/or transfers;A little help with bathing/dressing/bathroom;Assistance with cooking/housework;Help with stairs or ramp for entrance;Supervision due to cognitive status    Functional Status Assessment  Patient has had a recent decline in their functional status and demonstrates the ability to make significant improvements in function in a reasonable and predictable amount of time.  Equipment Recommendations  Other (comment) (defer)    Recommendations for Other Services       Precautions / Restrictions Precautions Precautions: Fall Precaution Comments: multiple recent  falls Restrictions Weight Bearing Restrictions Per Provider Order: No      Mobility Bed Mobility Overal bed mobility: Needs Assistance Bed Mobility: Sit to Supine           General bed mobility comments: able to complete sit to supine mod I, some assist with getting OOB    Transfers Overall transfer level: Needs assistance Equipment used: Rolling walker (2 wheels) Transfers: Sit to/from Stand, Bed to chair/wheelchair/BSC Sit to Stand: Contact guard assist     Step pivot transfers: Contact guard assist     General transfer comment: CGA, cueing for safety and keeping RW close      Balance Overall balance assessment: Needs assistance Sitting-balance support: No upper extremity supported, Feet supported Sitting balance-Leahy Scale: Fair Sitting balance - Comments: able to perform EOB ADLs   Standing balance support: During functional activity, Single extremity supported Standing balance-Leahy Scale: Poor Standing balance comment: without RW Pt has posterior lean and repeated LOB with static standing, decreased safety awareness.                           ADL either performed or assessed with clinical judgement   ADL Overall ADL's : At baseline;Needs assistance/impaired                                       General ADL Comments: likely close to baseline, CGA for mobility using RW, cueing for safety with RW     Vision Baseline Vision/History:  (reports torn  retinal tear) Ability to See in Adequate Light: 1 Impaired Patient Visual Report: Other (comment) (states his L eye sees curved lines)       Perception         Praxis         Pertinent Vitals/Pain Pain Assessment Pain Assessment: 0-10 Pain Score: 1  Pain Location: low back Pain Descriptors / Indicators: Aching, Discomfort Pain Intervention(s): Monitored during session     Extremity/Trunk Assessment Upper Extremity Assessment Upper Extremity Assessment: Overall WFL for  tasks assessed;LUE deficits/detail LUE Deficits / Details: cut the tips off his 1st and 2nd digit, some numbness, overall WFLs           Communication Communication Communication: No apparent difficulties   Cognition Arousal: Alert Behavior During Therapy: WFL for tasks assessed/performed Overall Cognitive Status: Impaired/Different from baseline Area of Impairment: Safety/judgement, Awareness                         Safety/Judgement: Decreased awareness of safety     General Comments: grossly WFLs, decreased awareness of deficits and safety.     General Comments       Exercises     Shoulder Instructions      Home Living Family/patient expects to be discharged to:: Private residence Living Arrangements: Alone Available Help at Discharge: Family;Available PRN/intermittently Type of Home: House Home Access: Stairs to enter [employer] of Steps: 1 Entrance Stairs-Rails: None Home Layout: One level     Bathroom Shower/Tub: Chief Strategy Officer: Standard Bathroom Accessibility: Yes   Home Equipment: Rollator (4 wheels);Shower seat;Grab bars - tub/shower   Additional Comments: Pt reports living alone, daughter comes by at least 1X/week      Prior Functioning/Environment Prior Level of Function : Independent/Modified Independent;Driving;History of Falls (last six months)             Mobility Comments: no DME use, sits in rollator when hed hurting, mostly sedentary ADLs Comments: Indep with ADLs, IADLs, shopping and driving. Pt reports using shopping cart due to difficulty walking long distances        OT Problem List: Impaired balance (sitting and/or standing);Decreased safety awareness      OT Treatment/Interventions: Self-care/ADL training;Therapeutic exercise;Energy conservation;DME and/or AE instruction;Therapeutic activities;Patient/family education;Balance training    OT Goals(Current goals can be found in the care  plan section) Acute Rehab OT Goals Patient Stated Goal: to improve balance OT Goal Formulation: With patient Time For Goal Achievement: 05/07/23 Potential to Achieve Goals: Good  OT Frequency: Min 1X/week    Co-evaluation              AM-PAC OT 6 Clicks Daily Activity     Outcome Measure Help from another person eating meals?: None Help from another person taking care of personal grooming?: None Help from another person toileting, which includes using toliet, bedpan, or urinal?: A Little Help from another person bathing (including washing, rinsing, drying)?: A Little Help from another person to put on and taking off regular upper body clothing?: None Help from another person to put on and taking off regular lower body clothing?: A Little 6 Click Score: 21   End of Session Equipment Utilized During Treatment: Gait belt;Rolling walker (2 wheels) Nurse Communication: Mobility status  Activity Tolerance: Patient tolerated treatment well Patient left: in bed;with call bell/phone within reach;with bed alarm set  OT Visit Diagnosis: Unsteadiness on feet (R26.81);Other abnormalities of gait and mobility (R26.89);Muscle weakness (generalized) (M62.81);History of falling (Z91.81)  Time: 8851-8779 OT Time Calculation (min): 32 min Charges:  OT General Charges $OT Visit: 1 Visit OT Evaluation $OT Eval Low Complexity: 1 Low OT Treatments $Self Care/Home Management : 8-22 mins  [address], OTR/L   Zandria Woldt R Melane Windholz 04/23/2023, 1:25 PM

## 2023-04-23 NOTE — Progress Notes (Signed)
 Mobility Specialist Progress Note:   04/23/23 1547  Mobility  Activity Ambulated with assistance in hallway  Level of Assistance Contact guard assist, steadying assist  Assistive Device Front wheel walker  Distance Ambulated (ft) 120 ft  Activity Response Tolerated well  Mobility Referral Yes  Mobility visit 1 Mobility  Mobility Specialist Start Time (ACUTE ONLY) 1515  Mobility Specialist Stop Time (ACUTE ONLY) 1530  Mobility Specialist Time Calculation (min) (ACUTE ONLY) 15 min   Pt received in bed, agreeable to mobility. Pt needed CG for all mobility d/t slight unsteadiness. Verbal cues required to remind pt to walk within RW. No LOB present during session. Pt c/o slight SOB when returning to bed. SpO2 in mid 90's. Pt left in supine position with call bell in reach and all needs met.   Timothy Wood  Mobility Specialist Please contact via [employer] office at [phone]

## 2023-04-23 NOTE — ED Provider Notes (Signed)
 [facility] GENERAL MED/SURG UNIT Provider Note  CSN: [id number] Arrival date & time: 04/22/23 1557  Chief Complaint(s) Fall  HPI Timothy Wood is a 83 y.o. male with PMH lung cancer, DVT PE, histoplasmosis who presents emergency department for evaluation of a fall.  Patient states that he fell in his kitchen and it was on the ground for approximately 6 hours.  Endorsing some pain in the low back but is denying head strike or loss of consciousness.  Denies chest pain, shortness of breath, abdominal pain, nausea, vomiting or other systemic symptoms.   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 diverticulosis also. 11/24/2010: 12 polyps removed - adenomas Anticipate routine repeat colonoscopy Spring 2013 Have recommended he see genetics counselor re ? Polyposis 03/27/2016 14 polyps removed max 15 mm    Pulmonary nodule 06/2006   9 mm right upper obe pulmonary  nodule (negative Pet Scan)   PVD (peripheral vascular disease) (HCC)    left leg s/p stent   Sebaceous cyst    neck   Seborrheic keratosis    Seizures (HCC)    4-5 yrs ago had seizures when in ICU- none since this time    Sinusitis    Tobacco abuse    Tremor    Patient Active Problem List   Diagnosis Date Noted   Traumatic rhabdomyolysis (HCC) 04/22/2023   Rhabdomyolysis 04/22/2023   Alcohol dependence, uncomplicated (HCC) 07/01/2022   Hypomagnesemia 07/01/2022   Rash and nonspecific skin eruption 07/01/2022   Pulmonary embolism (HCC) 06/04/2022   Fall at home, initial encounter 06/03/2022   Leukocytosis 06/03/2022   Hypocalcemia 06/03/2022   History of DVT (deep vein thrombosis) 06/03/2022   Aortic atherosclerosis (HCC) 02/14/2021   Vitamin D deficiency, unspecified 04/18/2019   History of colonic polyps 02/01/2019   Screening for viral disease 02/01/2019   Nightmare disorder 08/12/2018   Chronic anticoagulation 04/04/2018   Angiodysplasia of cecum 11/23/2017   Actinic keratoses 10/01/2017   Unstable gait 06/08/2016   Genetic testing 04/29/2016   Ganglion of right wrist 04/28/2016   Depression, major, in partial remission (HCC) 04/16/2016   Encounter for therapeutic drug monitoring 08/16/2015   Hand laceration    Closed compression fracture of L1 vertebra (HCC)  Hx of pulmonary emboli (HCC) 08/05/2015   Lung cancer (HCC) 08/05/2015   Syncope 08/05/2015   CAD (coronary artery disease) 08/05/2015   Kidney disease 08/05/2015   Bone lesion 08/05/2015   Prostate hypertrophy 08/05/2015   Hx of pulmonary embolus 08/05/2015   Complicated grieving 05/15/2015   Adenocarcinoma of right lung (HCC) 01/24/2015   Coronary atherosclerosis due to calcified coronary lesion of native artery 01/14/2015   Tremor 09/19/2014   COPD GOLD I 03/03/2013   Grieving 02/06/2013   Abnormal prostate on physical examination 08/01/2012   Polyposis coli - attenuated 09/12/2010   Peripheral  vascular disease (HCC) 06/11/2008   Hyperlipemia, mixed 03/02/2008   SEBORRHEIC KERATOSIS 03/02/2008   BACK PAIN, LUMBAR, CHRONIC 09/30/2007   TOBACCO ABUSE 08/19/2007   Essential hypertension 08/19/2007   Multiple pulmonary nodules 08/19/2007   Home Medication(s) Prior to Admission medications   Medication Sig Start Date End Date Taking? Authorizing Provider  Acetaminophen  500 MG capsule Take 1,000 mg by mouth daily as needed for fever.   Yes [provider]  amLODipine  (NORVASC ) 5 MG tablet Take 1 tablet (5 mg total) by mouth daily. 12/11/22  Yes Kate Lonni CROME, MD  apixaban  (ELIQUIS ) 5 MG TABS tablet Take 1 tablet (5 mg total) by mouth 2 (two) times daily. 07/13/22  Yes Jordan, [name], MD  carvedilol  (COREG ) 6.25 MG tablet Take 1 tablet (6.25 mg total) by mouth 2 (two) times daily. 04/29/22  Yes Kate Lonni CROME, MD  DULoxetine  (CYMBALTA ) 60 MG capsule Take 1 capsule (60 mg total) by mouth daily. 04/14/23  Yes Jordan, [name], MD  EPINEPHrine  (EPIPEN  2-PAK) 0.3 mg/0.3 mL IJ SOAJ injection To use daily if needed for severe allergy reaction. 08/20/21  Yes Jordan, [name], MD  folic acid  (FOLVITE ) 1 MG tablet Take 1 tablet (1 mg total) by mouth daily. 07/13/22  Yes Jordan, [name], MD  irbesartan  (AVAPRO ) 150 MG tablet Take 1 tablet (150 mg total) by mouth daily. 07/02/22  Yes Jordan, [name], MD  magnesium  oxide (MAG-OX) 400 MG tablet Take 1 tablet (400 mg total) by mouth daily. 08/31/22  Yes Jordan, [name], MD  omeprazole  (PRILOSEC) 20 MG capsule TAKE 1 CAPSULE BY MOUTH TWICE A DAY BEFORE MEALS 01/18/23  Yes Jordan, [name], MD  potassium chloride  (KLOR-CON ) 10 MEQ tablet Take 1 tablet (10 mEq total) by mouth daily as needed (with lasix ). 04/22/22 04/22/23 Yes Kate Lonni CROME, MD  rosuvastatin  (CRESTOR ) 20 MG tablet TAKE ONE TABLET BY MOUTH DAILY 09/15/22  Yes Jordan, [name], MD  thiamine  (VITAMIN B1) 100 MG tablet TAKE 1 TABLET BY MOUTH DAILY 09/15/22  Yes Jordan, [name],  MD  furosemide  (LASIX ) 20 MG tablet Take 1 tablet (20 mg total) by mouth daily as needed (for weight increase of 3 lbs overnight or 5 lbs in 1 week). Patient not taking: Reported on 04/22/2023 04/22/22   Kate Lonni CROME, MD  nicotine  (NICODERM CQ  - DOSED IN MG/24 HOURS) 21 mg/24hr patch Place 1 patch (21 mg total) onto the skin daily. Patient not taking: Reported on 04/22/2023 06/09/22   Singh, Prashant K, MD  Past Surgical History Past Surgical History:  Procedure Laterality Date   ANGIOPLASTY / STENTING ILIAC  08/2008   Dr. Serene SANES SURGERY  1975, 1978   x2 lumbar fusion over 30 years ago   BACK SURGERY     COLONOSCOPY  05/11/2006; 07/27/2006;05/05/2007, 11/24/2010   adenomas with 9 polyps index and 2.5 cm cecal polyp removed over 3 exams, last 2009 with APC. diverticulosis also.  2012: 12 adenomas removed, largest 8-10 mm, diverticulosis   CORONARY ANGIOPLASTY WITH STENT PLACEMENT     DESCENDING AORTIC ANEURYSM REPAIR W/ STENT  04/2010   Dr. Serene   ESOPHAGOGASTRODUODENOSCOPY  05/05/2007   GERD - one erosion   EYE SURGERY Bilateral    cataract   KNEE SURGERY  2002   left- cartilage    LUNG BIOPSY     PERCUTANEOUS PINNING Left 06/24/2012   Procedure: Repair Complex Laceration Left Thumb with percutaneous pinning.;  Surgeon: Balinda JAYSON Rogue, MD;  Location: [facility] OR;  Service: Plastics;  Laterality: Left;   THUMB AMPUTATION Left    pinning and index finger   TONSILLECTOMY  1956   TONSILLECTOMY     UPPER GASTROINTESTINAL ENDOSCOPY     VIDEO ASSISTED THORACOSCOPY (VATS)/WEDGE RESECTION Right 01/24/2015   Procedure: RIGHT VIDEO ASSISTED THORACOSCOPY (VATS)/WEDGE RESECTION;  Surgeon: Elspeth JAYSON Millers, MD;  Location: [facility] OR;  Service: Thoracic;  Laterality: Right;   vocal cord polyps removed     Family History Family History  Problem Relation Age of  Onset   Colon cancer Mother 21       multiple colon polyps   Deep vein thrombosis Mother    Cancer Mother 8       uterine cancer   Uterine cancer Mother    Lung cancer Mother    Lung cancer Son        smoked and was a welder   Heart disease Brother    Diabetes Other    Heart disease Brother    Rheum arthritis Daughter    Rheum arthritis Sister    Colon polyps Neg Hx    Stomach cancer Neg Hx    Rectal cancer Neg Hx     Social History Social History   Tobacco Use   Smoking status: Every Day    Current packs/day: 1.00    Average packs/day: 1 pack/day for 60.0 years (60.0 ttl pk-yrs)    Types: Cigarettes   Smokeless tobacco: Never  Vaping Use   Vaping status: Never Used  Substance Use Topics   Alcohol use: Yes    Comment: beer on occasion   Drug use: No   Allergies Codeine and Hydrocodone   Review of Systems Review of Systems  Musculoskeletal:  Positive for back pain.    Physical Exam Vital Signs  I have reviewed the triage vital signs BP (!) 149/83 (BP Location: Right Arm)   Pulse 91   Temp 98.4 F (36.9 C) (Oral)   Resp 18   Ht 5' 8 (1.727 m)   Wt 72.6 kg   SpO2 92%   BMI 24.33 kg/m   Physical Exam Constitutional:      General: He is not in acute distress.    Appearance: Normal appearance.  HENT:     Head: Normocephalic and atraumatic.     Nose: No congestion or rhinorrhea.  Eyes:     General:        Right eye: No discharge.        Left eye: No discharge.  Extraocular Movements: Extraocular movements intact.     Pupils: Pupils are equal, round, and reactive to light.  Cardiovascular:     Rate and Rhythm: Normal rate and regular rhythm.     Heart sounds: No murmur heard. Pulmonary:     Effort: No respiratory distress.     Breath sounds: No wheezing or rales.  Abdominal:     General: There is no distension.     Tenderness: There is no abdominal tenderness.  Musculoskeletal:        General: Tenderness present. Normal range of motion.      Cervical back: Normal range of motion.  Skin:    General: Skin is warm and dry.  Neurological:     General: No focal deficit present.     Mental Status: He is alert.     ED Results and Treatments Labs (all labs ordered are listed, but only abnormal results are displayed) Labs Reviewed  COMPREHENSIVE METABOLIC PANEL - Abnormal; Notable for the following components:      Result Value   Sodium 134 (*)    Chloride 97 (*)    Calcium  8.5 (*)    AST 48 (*)    All other components within normal limits  CK - Abnormal; Notable for the following components:   Total CK 1,605 (*)    All other components within normal limits  URINALYSIS, ROUTINE W REFLEX MICROSCOPIC - Abnormal; Notable for the following components:   Hgb urine dipstick MODERATE (*)    Ketones, ur 20 (*)    Protein, ur 30 (*)    Bacteria, UA RARE (*)    All other components within normal limits  CK - Abnormal; Notable for the following components:   Total CK 1,822 (*)    All other components within normal limits  CK - Abnormal; Notable for the following components:   Total CK 1,636 (*)    All other components within normal limits  APTT - Abnormal; Notable for the following components:   aPTT 38 (*)    All other components within normal limits  PROTIME-INR - Abnormal; Notable for the following components:   Prothrombin Time 15.4 (*)    All other components within normal limits  BASIC METABOLIC PANEL - Abnormal; Notable for the following components:   Potassium 3.4 (*)    Calcium  8.4 (*)    All other components within normal limits  MAGNESIUM  - Abnormal; Notable for the following components:   Magnesium  1.5 (*)    All other components within normal limits  CK  TROPONIN I (HIGH SENSITIVITY)  TROPONIN I (HIGH SENSITIVITY)                                                                                                                          Radiology CT Lumbar Spine Wo Contrast Result Date: 04/22/2023 CLINICAL  DATA:  Low back pain trauma EXAM: CT LUMBAR SPINE WITHOUT CONTRAST TECHNIQUE: Multidetector CT imaging of the lumbar spine was performed  without intravenous contrast administration. Multiplanar CT image reconstructions were also generated. RADIATION DOSE REDUCTION: This exam was performed according to the departmental dose-optimization program which includes automated exposure control, adjustment of the mA and/or kV according to patient size and/or use of iterative reconstruction technique. COMPARISON:  Radiograph 10/23/2019, 06/03/2022, 10/22/2022 FINDINGS: Segmentation: 5 lumbar type vertebrae. Alignment: Straightening of lumbar lordosis. Vertebrae: Mild to moderate chronic superior endplate deformities at L2 and L3. Moderate chronic compression fracture of L1 with about 5 mm retropulsion of the upper vertebral body and estimated 50% loss of vertebral body height centrally. No definite acute fracture is seen. Paraspinal and other soft tissues: No acute paravertebral or paraspinal soft tissue abnormality. Dense aortic vascular calcifications. Aortoiliac stents are noted. Incompletely visualized low-density lesions in the kidneys, likely cysts, no specific imaging follow-up is recommended. Disc levels: At T12-L1, no canal stenosis. Facet degenerative changes. Patent foramen. At L1-L2, patent disc space. No canal stenosis. Bilateral facet degenerative changes. At L2-L3, no canal stenosis. Facet degenerative changes. Patent foramen. At L3-L4, mild diffuse disc bulge. No canal stenosis. Facet degenerative changes. Patent foramen. At L4-L5, disc space narrowing. Moderate diffuse disc bulge without canal stenosis. Facet degenerative changes. Patent foramen. At L5-S1, disc space narrowing. No canal stenosis. Facet degenerative changes. Patent foramen. IMPRESSION: 1. No CT evidence for acute osseous abnormality of the lumbar spine. 2. Chronic compression fractures of L1, L2, and L3. 3. Multilevel degenerative changes. 4.  Aortic atherosclerosis. Aortic Atherosclerosis (ICD10-I70.0). Electronically Signed   By: Luke Bun M.D.   On: 04/22/2023 19:02   CT CERVICAL SPINE WO CONTRAST Result Date: 04/22/2023 CLINICAL DATA:  Status post trauma. EXAM: CT CERVICAL SPINE WITHOUT CONTRAST TECHNIQUE: Multidetector CT imaging of the cervical spine was performed without intravenous contrast. Multiplanar CT image reconstructions were also generated. RADIATION DOSE REDUCTION: This exam was performed according to the departmental dose-optimization program which includes automated exposure control, adjustment of the mA and/or kV according to patient size and/or use of iterative reconstruction technique. COMPARISON:  Aug 06, 2015 FINDINGS: Alignment: Normal. Skull base and vertebrae: No acute fracture. No primary bone lesion or focal pathologic process. Soft tissues and spinal canal: No prevertebral fluid or swelling. No visible canal hematoma. Disc levels: Moderate to marked severity multilevel endplate sclerosis is noted throughout all levels of the cervical spine. Mild to moderate severity anterior osteophyte formation is also seen at the levels of C2-C3, C3-C4, C4-C5 and C6-C7. Marked severity anterior osteophyte formation is present at C5-C6. Moderate to marked severity posterior bony spurring is seen at the level of C5-C6. There is marked severity narrowing of the anterior atlantoaxial articulation. Marked severity intervertebral disc space narrowing is seen at C5-C6. Marked severity bilateral multilevel facet joint hypertrophy is noted. Upper chest: Negative. Other: None. IMPRESSION: 1. No acute fracture or subluxation in the cervical spine. 2. Marked severity multilevel degenerative changes, most prominent at the level of C5-C6. Electronically Signed   By: Suzen Dials M.D.   On: 04/22/2023 18:51   CT HEAD WO CONTRAST ( ) Result Date: 04/22/2023 CLINICAL DATA:  Status post trauma. EXAM: CT HEAD WITHOUT CONTRAST TECHNIQUE:  Contiguous axial images were obtained from the base of the skull through the vertex without intravenous contrast. RADIATION DOSE REDUCTION: This exam was performed according to the departmental dose-optimization program which includes automated exposure control, adjustment of the mA and/or kV according to patient size and/or use of iterative reconstruction technique. COMPARISON:  Aug 05, 2015 FINDINGS: Brain: There is mild to moderate severity cerebral atrophy  with widening of the extra-axial spaces and ventricular dilatation. There are areas of decreased attenuation within the white matter tracts of the supratentorial brain, consistent with microvascular disease changes. Vascular: Moderate to marked severity bilateral cavernous carotid artery calcification is noted. Skull: Chronic bilateral nondisplaced nasal bone fractures are seen. Sinuses/Orbits: No acute finding. Other: None. IMPRESSION: 1. No acute intracranial abnormality. 2. Generalized cerebral atrophy with widening of the extra-axial spaces and ventricular dilatation. 3. Chronic bilateral nondisplaced nasal bone fractures. Electronically Signed   By: Suzen Dials M.D.   On: 04/22/2023 18:48   DG Chest Portable 1 View Result Date: 04/22/2023 CLINICAL DATA:  Fall from chair EXAM: PORTABLE PELVIS 1-2 VIEWS; PORTABLE CHEST - 1 VIEW COMPARISON:  06/03/2022 FINDINGS: Heart and mediastinum are normal. No acute abnormality of the lungs. No acute osseous findings. There is no evidence of displaced pelvic fracture or diastasis. No pelvic bone lesions are seen. Nonobstructive pattern of overlying bowel gas. Left common iliac artery stent. IMPRESSION: 1. No acute abnormality of the lungs. 2. No displaced fracture or dislocation of the pelvis. Recommend CT to more sensitively evaluate if there is high clinical suspicion for fracture. Electronically Signed   By: Marolyn JONETTA Jaksch M.D.   On: 04/22/2023 17:33   DG Pelvis Portable Result Date: 04/22/2023 CLINICAL  DATA:  Fall from chair EXAM: PORTABLE PELVIS 1-2 VIEWS; PORTABLE CHEST - 1 VIEW COMPARISON:  06/03/2022 FINDINGS: Heart and mediastinum are normal. No acute abnormality of the lungs. No acute osseous findings. There is no evidence of displaced pelvic fracture or diastasis. No pelvic bone lesions are seen. Nonobstructive pattern of overlying bowel gas. Left common iliac artery stent. IMPRESSION: 1. No acute abnormality of the lungs. 2. No displaced fracture or dislocation of the pelvis. Recommend CT to more sensitively evaluate if there is high clinical suspicion for fracture. Electronically Signed   By: Marolyn JONETTA Jaksch M.D.   On: 04/22/2023 17:33    Pertinent labs & imaging results that were available during my care of the patient were reviewed by me and considered in my medical decision making (see MDM for details).  Medications Ordered in ED Medications  sodium bicarbonate 150 mEq in sterile water  1,150 mL infusion ( Intravenous Restarted 04/23/23 1129)  apixaban  (ELIQUIS ) tablet 5 mg (5 mg Oral Given 04/23/23 1011)  DULoxetine  (CYMBALTA ) DR capsule 60 mg (60 mg Oral Given 04/23/23 1010)  irbesartan  (AVAPRO ) tablet 150 mg (150 mg Oral Given 04/23/23 1015)  carvedilol  (COREG ) tablet 6.25 mg (6.25 mg Oral Given 04/23/23 1010)  amLODipine  (NORVASC ) tablet 5 mg (5 mg Oral Given 04/23/23 1010)  folic acid  (FOLVITE ) tablet 1 mg (1 mg Oral Given 04/23/23 1010)  thiamine  (VITAMIN B1) tablet 100 mg (100 mg Oral Given 04/23/23 1010)  pantoprazole  (PROTONIX ) EC tablet 40 mg (40 mg Oral Given 04/23/23 1010)  magnesium  oxide (MAG-OX) tablet 400 mg (400 mg Oral Given 04/23/23 1010)  acetaminophen  (TYLENOL ) tablet 650 mg (has no administration in time range)    Or  acetaminophen  (TYLENOL ) suppository 650 mg (has no administration in time range)  polyethylene glycol (MIRALAX  / GLYCOLAX ) packet 17 g (has no administration in time range)  sodium chloride  flush (NS) 0.9 % injection 3 mL (3 mLs Intravenous Given 04/23/23 1021)   potassium chloride  SA (KLOR-CON  M) CR tablet 40 mEq (40 mEq Oral Given 04/23/23 1010)  nicotine  (NICODERM CQ  - dosed in mg/24 hours) patch 21 mg (21 mg Transdermal Patch Applied 04/23/23 1010)  lactated ringers  bolus 1,000 mL (0 mLs Intravenous  Stopped 04/22/23 2023)  lactated ringers  bolus 1,000 mL (0 mLs Intravenous Stopped 04/22/23 2121)  magnesium  sulfate IVPB 2 g 50 mL (2 g Intravenous New Bag/Given 04/23/23 1021)  lactated ringers  bolus 1,000 mL (1,000 mLs Intravenous New Bag/Given 04/23/23 0830)                                                                                                                                     Procedures .Critical Care  Performed by: Albertina Dixon, MD Authorized by: Albertina Dixon, MD   Critical care provider statement:    Critical care time (minutes):  30   Critical care was necessary to treat or prevent imminent or life-threatening deterioration of the following conditions:  Dehydration   Critical care was time spent personally by me on the following activities:  Development of treatment plan with patient or surrogate, discussions with consultants, evaluation of patient's response to treatment, examination of patient, ordering and review of laboratory studies, ordering and review of radiographic studies, ordering and performing treatments and interventions, pulse oximetry, re-evaluation of patient's condition and review of old charts   (including critical care time)  Medical Decision Making / ED Course   This patient presents to the ED for concern of fall, this involves an extensive number of treatment options, and is a complaint that carries with it a high risk of complications and morbidity.  The differential diagnosis includes fracture, contusion, hematoma, ligamentous injury, closed head injury, ICH, laceration, intrathoracic injury, intra-abdominal injury  MDM: Patient seen emergency room for evaluation of a fall.  Physical exam with some tenderness  in the L-spine but is otherwise unremarkable.  No additional external signs of trauma over the chest abdomen or pelvis.  Trauma imaging reassuringly negative for fracture or acute traumatic injury.  Laboratory evaluation with a sodium of 134, creatinine normal, urinalysis with moderate hemoglobin but no red blood cells.  Initial CK16 05 and patient given 2 L of fluid.  On recheck, CK is still uptrending at 1636.  Patient will require hospital admission for traumatic rhabdomyolysis.  Patient admitted   Additional history obtained:  -External records from outside source obtained and reviewed including: Chart review including previous notes, labs, imaging, consultation notes   Lab Tests: -I ordered, reviewed, and interpreted labs.   The pertinent results include:   Labs Reviewed  COMPREHENSIVE METABOLIC PANEL - Abnormal; Notable for the following components:      Result Value   Sodium 134 (*)    Chloride 97 (*)    Calcium  8.5 (*)    AST 48 (*)    All other components within normal limits  CK - Abnormal; Notable for the following components:   Total CK 1,605 (*)    All other components within normal limits  URINALYSIS, ROUTINE W REFLEX MICROSCOPIC - Abnormal; Notable for the following components:   Hgb urine dipstick MODERATE (*)    Ketones, ur 20 (*)  Protein, ur 30 (*)    Bacteria, UA RARE (*)    All other components within normal limits  CK - Abnormal; Notable for the following components:   Total CK 1,822 (*)    All other components within normal limits  CK - Abnormal; Notable for the following components:   Total CK 1,636 (*)    All other components within normal limits  APTT - Abnormal; Notable for the following components:   aPTT 38 (*)    All other components within normal limits  PROTIME-INR - Abnormal; Notable for the following components:   Prothrombin Time 15.4 (*)    All other components within normal limits  BASIC METABOLIC PANEL - Abnormal; Notable for the  following components:   Potassium 3.4 (*)    Calcium  8.4 (*)    All other components within normal limits  MAGNESIUM  - Abnormal; Notable for the following components:   Magnesium  1.5 (*)    All other components within normal limits  CK  TROPONIN I (HIGH SENSITIVITY)  TROPONIN I (HIGH SENSITIVITY)        Imaging Studies ordered: I ordered imaging studies including CT head, C-spine, chest x-ray, pelvis x-ray I independently visualized and interpreted imaging. I agree with the radiologist interpretation   Medicines ordered and prescription drug management: Meds ordered this encounter  Medications   lactated ringers  bolus 1,000 mL   lactated ringers  bolus 1,000 mL   sodium bicarbonate 150 mEq in sterile water  1,150 mL infusion    Sodium Bicarbonate 150 mEq added to 1L SWFI = 261 mOsm/L.   apixaban  (ELIQUIS ) tablet 5 mg   DULoxetine  (CYMBALTA ) DR capsule 60 mg   irbesartan  (AVAPRO ) tablet 150 mg   carvedilol  (COREG ) tablet 6.25 mg   amLODipine  (NORVASC ) tablet 5 mg   folic acid  (FOLVITE ) tablet 1 mg   thiamine  (VITAMIN B1) tablet 100 mg   pantoprazole  (PROTONIX ) EC tablet 40 mg   magnesium  oxide (MAG-OX) tablet 400 mg   OR Linked Order Group    acetaminophen  (TYLENOL ) tablet 650 mg    acetaminophen  (TYLENOL ) suppository 650 mg   polyethylene glycol (MIRALAX  / GLYCOLAX ) packet 17 g   sodium chloride  flush (NS) 0.9 % injection 3 mL   potassium chloride  SA (KLOR-CON  M) CR tablet 40 mEq   magnesium  sulfate IVPB 2 g 50 mL   lactated ringers  bolus 1,000 mL   nicotine  (NICODERM CQ  - dosed in mg/24 hours) patch 21 mg    -I have reviewed the patients home medicines and have made adjustments as needed  Critical interventions Fluid resuscitation   Cardiac Monitoring: The patient was maintained on a cardiac monitor.  I personally viewed and interpreted the cardiac monitored which showed an underlying rhythm of: NSR  Social Determinants of Health:  Factors impacting patients  care include: none   Reevaluation: After the interventions noted above, I reevaluated the patient and found that they have :stayed the same  Co morbidities that complicate the patient evaluation  Past Medical History:  Diagnosis Date   Adenocarcinoma of lung, stage 1 (HCC)    Anemia    Angiodysplasia of cecum 11/23/2017   Multiple 1-2 mm   Arthritis    back possibly   Blood clotting disorder (HCC)    Cataract    removed bilaterally    Chronic obstructive pulmonary disease (COPD) (HCC)    patient not aware   Clotting disorder (HCC)    PE LUNG, dvt leg    Colon polyps  Depression    Diverticulosis    DVT (deep venous thrombosis) (HCC)    Post op  knee surgery   DVT (deep venous thrombosis) (HCC)    GERD (gastroesophageal reflux disease)    Histoplasmosis    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 diverticulosis also. 11/24/2010: 12 polyps removed - adenomas Anticipate routine repeat colonoscopy Spring 2013 Have recommended he see genetics counselor re ? Polyposis 03/27/2016 14 polyps removed max 15 mm    Pulmonary nodule 06/2006   9 mm right upper obe pulmonary nodule (negative Pet Scan)   PVD (peripheral vascular disease) (HCC)    left leg s/p stent   Sebaceous cyst    neck   Seborrheic keratosis    Seizures (HCC)    4-5 yrs ago had seizures when in ICU- none since this time    Sinusitis    Tobacco abuse    Tremor       Dispostion: I considered admission for this patient, and patient require hospital admission for traumatic  rhabdomyolysis     Final Clinical Impression(s) / ED Diagnoses Final diagnoses:  Fall, initial encounter  Abnormal CK     @PCDICTATION @    Albertina Dixon, MD 04/23/23 1205

## 2023-04-23 NOTE — TOC Initial Note (Signed)
 Transition of Care [facility]) - Initial/Assessment Note    Patient Details  Name: Timothy Wood MRN: [id number] Date of Birth: Nov 20, 1940  [facility] CM/SW Contact:    Lauraine FORBES Saa, LCSW Phone Number: 04/23/2023, 10:05 AM  Clinical Narrative:                  10:05 AM CSW attempted to call patient regarding SNF but there was no response and a voicemail was left.  Expected Discharge Plan: Skilled Nursing Facility Barriers to Discharge: Continued Medical Work up   Patient Goals and CMS Choice            Expected Discharge Plan and Services In-house Referral: Clinical Social Work   Post Acute Care Choice: Skilled Nursing Facility Living arrangements for the past 2 months: Single Family Home                                      Prior Living Arrangements/Services Living arrangements for the past 2 months: Single Family Home Lives with:: Self Patient language and need for interpreter reviewed:: Yes        Need for Family Participation in Patient Care: Yes (Comment) Care giver support system in place?: Yes (comment)   Criminal Activity/Legal Involvement Pertinent to Current Situation/Hospitalization: No - Comment as needed  Activities of Daily Living   ADL Screening (condition at time of admission) Independently performs ADLs?: Yes (appropriate for developmental age) Is the patient deaf or have difficulty hearing?: Yes Does the patient have difficulty seeing, even when wearing glasses/contacts?: No Does the patient have difficulty concentrating, remembering, or making decisions?: No  Permission Sought/Granted Permission sought to share information with : Family Supports Permission granted to share information with : No (Contact information on chart)  Share Information with NAME: Rosaline Lever  Permission granted to share info w AGENCY: SNF  Permission granted to share info w Relationship: Daughter  Permission granted to share info w Contact  Information: [phone]  Emotional Assessment       Orientation: : Oriented to Self, Oriented to Place, Oriented to  Time, Oriented to Situation Alcohol / Substance Use: Not Applicable Psych Involvement: No (comment)  Admission diagnosis:  Rhabdomyolysis [M62.82] Abnormal CK [R74.8] Fall, initial encounter [W19.XXXA] Patient Active Problem List   Diagnosis Date Noted   Traumatic rhabdomyolysis (HCC) 04/22/2023   Rhabdomyolysis 04/22/2023   Alcohol dependence, uncomplicated (HCC) 07/01/2022   Hypomagnesemia 07/01/2022   Rash and nonspecific skin eruption 07/01/2022   Pulmonary embolism (HCC) 06/04/2022   Fall at home, initial encounter 06/03/2022   Leukocytosis 06/03/2022   Hypocalcemia 06/03/2022   History of DVT (deep vein thrombosis) 06/03/2022   Aortic atherosclerosis (HCC) 02/14/2021   Vitamin D deficiency, unspecified 04/18/2019   History of colonic polyps 02/01/2019   Screening for viral disease 02/01/2019   Nightmare disorder 08/12/2018   Chronic anticoagulation 04/04/2018   Angiodysplasia of cecum 11/23/2017   Actinic keratoses 10/01/2017   Unstable gait 06/08/2016   Genetic testing 04/29/2016   Ganglion of right wrist 04/28/2016   Depression, major, in partial remission (HCC) 04/16/2016   Encounter for therapeutic drug monitoring 08/16/2015   Hand laceration    Closed compression fracture of L1 vertebra (HCC)    Hx of pulmonary emboli (HCC) 08/05/2015   Lung cancer (HCC) 08/05/2015   Syncope 08/05/2015   CAD (coronary artery disease) 08/05/2015   Kidney disease 08/05/2015   Bone  lesion 08/05/2015   Prostate hypertrophy 08/05/2015   Hx of pulmonary embolus 08/05/2015   Complicated grieving 05/15/2015   Adenocarcinoma of right lung (HCC) 01/24/2015   Coronary atherosclerosis due to calcified coronary lesion of native artery 01/14/2015   Tremor 09/19/2014   COPD GOLD I 03/03/2013   Grieving 02/06/2013   Abnormal prostate on physical examination 08/01/2012    Polyposis coli - attenuated 09/12/2010   Peripheral vascular disease (HCC) 06/11/2008   Hyperlipemia, mixed 03/02/2008   SEBORRHEIC KERATOSIS 03/02/2008   BACK PAIN, LUMBAR, CHRONIC 09/30/2007   TOBACCO ABUSE 08/19/2007   Essential hypertension 08/19/2007   Multiple pulmonary nodules 08/19/2007   PCP:  Jordan, [name], MD Pharmacy:   [facility] [id number] - [address] [place] - [address] [facility] [address] [place] [address] Phone: [phone] Fax: [phone]  [facility] - [place], [place] - [address] [place] [address] Phone: [phone] Fax: [phone]     Social Drivers of Health (SDOH) Social History: SDOH Screenings   Food Insecurity: No Food Insecurity (04/23/2023)  Housing: Low Risk  (04/23/2023)  Transportation Needs: No Transportation Needs (04/23/2023)  Utilities: Not At Risk (04/23/2023)  Alcohol Screen: Low Risk  (05/08/2020)  Depression (PHQ2-9): High Risk (04/14/2023)  Financial Resource Strain: Low Risk  (11/03/2021)  Physical Activity: Inactive (11/03/2021)  Social Connections: Socially Isolated (04/23/2023)  Stress: Stress Concern Present (11/03/2021)  Tobacco Use: High Risk (04/22/2023)   SDOH Interventions:     Readmission Risk Interventions     No data to display

## 2023-04-24 LAB — BASIC METABOLIC PANEL
Anion gap: 10 (ref 5–15)
BUN: 11 mg/dL (ref 8–23)
CO2: 24 mmol/L (ref 22–32)
Calcium: 8.1 mg/dL — ABNORMAL LOW (ref 8.9–10.3)
Chloride: 100 mmol/L (ref 98–111)
Creatinine, Ser: 0.94 mg/dL (ref 0.61–1.24)
GFR, Estimated: >60 mL/min (ref >60)
Glucose, Bld: 119 mg/dL — ABNORMAL HIGH (ref 70–99)
Potassium: 3.7 mmol/L (ref 3.5–5.1)
Sodium: 134 mmol/L — ABNORMAL LOW (ref 135–145)

## 2023-04-24 LAB — MAGNESIUM: Magnesium: 1.9 mg/dL (ref 1.7–2.4)

## 2023-04-24 LAB — PHOSPHORUS: Phosphorus: 2.1 mg/dL — ABNORMAL LOW (ref 2.5–4.6)

## 2023-04-24 NOTE — Plan of Care (Signed)
 Patient A/Ox4. No complaints of pain. Ambulated with PT in hallway while using a walker.  Problem: Health Behavior/Discharge Planning: Goal: Ability to manage health-related needs will improve Outcome: Progressing   Problem: Clinical Measurements: Goal: Ability to maintain clinical measurements within normal limits will improve Outcome: Progressing   Problem: Activity: Goal: Risk for activity intolerance will decrease Outcome: Progressing   Problem: Nutrition: Goal: Adequate nutrition will be maintained Outcome: Progressing   Problem: Safety: Goal: Ability to remain free from injury will improve Outcome: Progressing   Problem: Skin Integrity: Goal: Risk for impaired skin integrity will decrease Outcome: Progressing

## 2023-04-24 NOTE — Progress Notes (Signed)
  Progress Note   Patient: Timothy Wood FMW:[id number] DOB: May 19, 1940 DOA: 04/22/2023     1 DOS: the patient was seen and examined on 04/24/2023   Brief hospital course:  83 year old with history of chronic back pain, chronic lower extremity weaknesses independent at home presented to the ER with falling at home and unable to get back up. In the emergency room hemodynamically stable. CK level elevated to 2000. Skeletal survey negative.   Assessment and Plan:  Acute rhabdomyolysis secondary to ground-level fall and weakness -CK peaked at 1822, now downtrending.  Initial imaging showing no acute fractures.  Continue IV fluid hydration.  Recheck BMP and CK in AM.  PT/OT on board.  Recommending short-term rehab.  Hypokalemia/hypomagnesemia - Showing improvement after replenishment.  Will recheck BMP and mag in AM.  Paroxysmal atrial fibrillation - Continue home Eliquis  and Coreg .  Adenocarcinoma of right lung -Follows with oncology in the outpatient setting  Hypertension/GERD - Resume patient's home medication regimen.  Tobacco dependence - Patient continues to smoke around 2 packs a day.  Has no desire to quit.  Nicotine  patch as needed.       Subjective: Patient feeling improved this morning.  States his urine output is excellent.  Pain is improved.  Denies any fever, chills, shortness of breath, chest pain, nausea, vomiting, abdominal pain.  Still overall just very weak.  Physical Exam: Vitals:   04/23/23 2039 04/24/23 0030 04/24/23 0523 04/24/23 0722  BP: 116/64 139/67 (!) 146/66 129/86  Pulse: 66 65 62 (!) 59  Resp: 19 18 18 16   Temp: 97.9 F (36.6 C) 98.3 F (36.8 C) 98.2 F (36.8 C) 98.1 F (36.7 C)  TempSrc:    Oral  SpO2: 93% 93% 91% 94%  Weight:      Height:       GENERAL:  Alert, pleasant, no acute distress  HEENT:  EOMI CARDIOVASCULAR:  RRR, no murmurs appreciated RESPIRATORY:  Clear to auscultation, no wheezing, rales, or rhonchi GASTROINTESTINAL:   Soft, nontender, nondistended EXTREMITIES:  No LE edema bilaterally NEURO:  No new focal deficits appreciated SKIN:  No rashes noted PSYCH:  Appropriate mood and affect   Data Reviewed:  There are no new results to review at this time.  Family Communication: None at bedside  Disposition: Status is: Inpatient Remains inpatient appropriate because: Continued monitoring for rhabdomyolysis and STR placement  Planned Discharge Destination: Skilled nursing facility    Time spent: 33 minutes  Author: Carliss LELON Canales, DO 04/24/2023 11:18 AM  For on call review [email or web address].

## 2023-04-24 NOTE — Plan of Care (Signed)

## 2023-04-24 NOTE — Progress Notes (Signed)
 Mobility Specialist: Progress Note   04/24/23 1246  Mobility  Activity Ambulated with assistance in hallway  Level of Assistance Contact guard assist, steadying assist  Assistive Device Front wheel walker  Distance Ambulated (ft) 170 ft  Activity Response Tolerated well  Mobility Referral Yes  Mobility visit 1 Mobility  Mobility Specialist Start Time (ACUTE ONLY) 1024  Mobility Specialist Stop Time (ACUTE ONLY) 1038  Mobility Specialist Time Calculation (min) (ACUTE ONLY) 14 min    Pt was agreeable to mobility session - received in bed. No complaints. CG throughout. Returned to room without fault. Left in bed with all needs met, call bell in reach.   Timothy Wood Mobility Specialist Please contact via SecureChat or Rehab office at [phone]

## 2023-04-25 DIAGNOSIS — R748 Abnormal levels of other serum enzymes: Secondary | ICD-10-CM

## 2023-04-25 LAB — BASIC METABOLIC PANEL
Anion gap: 8 (ref 5–15)
BUN: 9 mg/dL (ref 8–23)
CO2: 22 mmol/L (ref 22–32)
Calcium: 8.2 mg/dL — ABNORMAL LOW (ref 8.9–10.3)
Chloride: 104 mmol/L (ref 98–111)
Creatinine, Ser: 0.86 mg/dL (ref 0.61–1.24)
GFR, Estimated: >60 mL/min (ref >60)
Glucose, Bld: 120 mg/dL — ABNORMAL HIGH (ref 70–99)
Potassium: 4.5 mmol/L (ref 3.5–5.1)
Sodium: 134 mmol/L — ABNORMAL LOW (ref 135–145)

## 2023-04-25 LAB — CBC
HCT: 41.3 % (ref 39.0–52.0)
Hemoglobin: 14 g/dL (ref 13.0–17.0)
MCH: 31.1 pg (ref 26.0–34.0)
MCHC: 33.9 g/dL (ref 30.0–36.0)
MCV: 91.8 fL (ref 80.0–100.0)
Platelets: 122 10*3/uL — ABNORMAL LOW (ref 150–400)
RBC: 4.5 MIL/uL (ref 4.22–5.81)
RDW: 13.2 % (ref 11.5–15.5)
WBC: 4.6 10*3/uL (ref 4.0–10.5)
nRBC: 0 % (ref 0.0–0.2)

## 2023-04-25 LAB — CK: Total CK: 261 U/L (ref 49–397)

## 2023-04-25 LAB — MAGNESIUM: Magnesium: 1.8 mg/dL (ref 1.7–2.4)

## 2023-04-25 NOTE — Plan of Care (Signed)

## 2023-04-25 NOTE — Progress Notes (Signed)
 Mobility Specialist: Progress Note   04/25/23 1614  Mobility  Activity Ambulated with assistance in hallway  Level of Assistance Contact guard assist, steadying assist  Assistive Device Front wheel walker  Distance Ambulated (ft) 150 ft  Activity Response Tolerated well  Mobility Referral Yes  Mobility visit 1 Mobility  Mobility Specialist Start Time (ACUTE ONLY) 1510  Mobility Specialist Stop Time (ACUTE ONLY) 1524  Mobility Specialist Time Calculation (min) (ACUTE ONLY) 14 min    Pt was agreeable to mobility session - received in bed. No complaints. CG throughout. Returned to room without fault. Left in bed with all needs met, call bell in reach.   Ileana Lute Mobility Specialist Please contact via SecureChat or Rehab office at [phone]

## 2023-04-25 NOTE — Progress Notes (Signed)
  Progress Note   Patient: Timothy Wood FMW:[id number] DOB: 1940/07/21 DOA: 04/22/2023     2 DOS: the patient was seen and examined on 04/25/2023   Brief hospital course:  83 year old with history of chronic back pain, chronic lower extremity weaknesses independent at home presented to the ER with falling at home and unable to get back up. In the emergency room hemodynamically stable. CK level elevated to 2000. Skeletal survey negative.   Assessment and Plan:  Acute rhabdomyolysis secondary to ground-level fall and weakness -CK peaked at 1822, subsequently downtrending and now nearly resolved.  Initial imaging showing no acute fractures.  Continue IV fluid hydration.  Recheck BMP and CK in AM.  PT/OT on board.  Recommending short-term rehab.  Hypokalemia/hypomagnesemia - Showing improvement after replenishment.  Will recheck BMP and mag in AM.  Paroxysmal atrial fibrillation - Continue home Eliquis  and Coreg .  Adenocarcinoma of right lung -Follows with oncology in the outpatient setting  Hypertension/GERD - Resume patient's home medication regimen.  Tobacco dependence - Patient continues to smoke around 2 packs a day.  Has no desire to quit.  Nicotine  patch as needed.       Subjective: Patient feeling improved this morning.  Is able to participate with PT/OT yesterday.  Pain is resolved.  Denies any fever, chills, shortness of breath, chest pain, nausea, vomiting, abdominal pain.  Still overall just very weak.  Physical Exam: Vitals:   04/24/23 1555 04/24/23 2002 04/25/23 0437 04/25/23 0730  BP: 119/69 123/71 (!) 162/88 (!) 152/85  Pulse: 67 72 62 63  Resp: 16 18 18 16   Temp: 98 F (36.7 C) 98.1 F (36.7 C) (!) 97.5 F (36.4 C) (!) 97.5 F (36.4 C)  TempSrc: Oral Oral Oral Oral  SpO2: 93% 95% 96% 95%  Weight:      Height:       GENERAL:  Alert, pleasant, no acute distress  HEENT:  EOMI CARDIOVASCULAR:  RRR, no murmurs appreciated RESPIRATORY:  Clear to  auscultation, no wheezing, rales, or rhonchi GASTROINTESTINAL:  Soft, nontender, nondistended EXTREMITIES:  No LE edema bilaterally NEURO:  No new focal deficits appreciated SKIN:  No rashes noted PSYCH:  Appropriate mood and affect   Data Reviewed:  There are no new results to review at this time.  Family Communication: None at bedside  Disposition: Status is: Inpatient Remains inpatient appropriate because: Continued monitoring for rhabdomyolysis and STR placement  Planned Discharge Destination: Skilled nursing facility    Time spent: 31 minutes  Author: Carliss LELON Canales, DO 04/25/2023 11:24 AM  For on call review [email or web address].

## 2023-04-26 NOTE — Progress Notes (Signed)
  Progress Note   Patient: Timothy Wood JXB:[id number] DOB: [birth year] DOA: 04/22/2023     3 DOS: the patient was seen and examined on 04/26/2023   Brief hospital course:  83 year old with history of chronic back pain, chronic lower extremity weaknesses independent at home presented to the ER with falling at home and unable to get back up. In the emergency room hemodynamically stable. CK level elevated to 2000. Skeletal survey negative.   Assessment and Plan:  Acute rhabdomyolysis secondary to ground-level fall and weakness -CK peaked at 1822, subsequently downtrending and now nearly resolved.  Initial imaging showing no acute fractures.  Continue IV fluid hydration.  Recheck BMP and CK in AM.  PT/OT on board.  Recommending short-term rehab.  Hypokalemia/hypomagnesemia - Showing improvement after replenishment.  Will recheck BMP and mag in AM.  Paroxysmal atrial fibrillation - Continue home Eliquis  and Coreg .  Adenocarcinoma of right lung -Follows with oncology in the outpatient setting  Hypertension/GERD - Resume patient's home medication regimen.  Tobacco dependence - Patient continues to smoke around 2 packs a day.  Has no desire to quit.  Nicotine  patch as needed.  Goals of care - Patient medically stable.  Able to be transition to short-term rehab pending patient decision and insurance authorization.       Subjective: Patient feeling improved this morning.  Is able to participate with PT/OT.  Pain is resolved.  Denies any fever, chills, shortness of breath, chest pain, nausea, vomiting, abdominal pain.  Motivated to transition to rehab.  Physical Exam: Vitals:   04/25/23 1605 04/25/23 2042 04/26/23 0518 04/26/23 0747  BP: 129/66 121/79 126/77 135/83  Pulse: 64 73 69 72  Resp: 16 18 18 16   Temp: (!) 97.4 F (36.3 C) 98 F (36.7 C) 97.6 F (36.4 C) 98 F (36.7 C)  TempSrc: Oral  Oral Oral  SpO2: 95% 93% 95% 95%  Weight:      Height:       GENERAL:  Alert,  pleasant, no acute distress  HEENT:  EOMI CARDIOVASCULAR:  RRR, no murmurs appreciated RESPIRATORY:  Clear to auscultation, no wheezing, rales, or rhonchi GASTROINTESTINAL:  Soft, nontender, nondistended EXTREMITIES:  No LE edema bilaterally NEURO:  No new focal deficits appreciated SKIN:  No rashes noted PSYCH:  Appropriate mood and affect   Data Reviewed:  There are no new results to review at this time.  Family Communication: None at bedside  Disposition: Status is: Inpatient Remains inpatient appropriate because: Continued monitoring for rhabdomyolysis and STR placement  Planned Discharge Destination: Skilled nursing facility    Time spent: 32 minutes  Author: Jodeane Mulligan, DO 04/26/2023 11:24 AM  For on call review [email or web address].

## 2023-04-26 NOTE — Progress Notes (Signed)
 Physical Therapy Treatment Patient Details Name: Timothy Wood MRN: [id number] DOB: 1941/03/14 Today's Date: 04/26/2023   History of Present Illness The pt is an 83 yo male presenting 2/6 after a fall at home where he spent 8 hours on the floor before being able to call for help. Admitted for management of traumatic rhabdomyolysis. Imaging showed no acute injury, chronic compression fx of L1, L2, and L3. PMH includes: anemia, arthritis, COPD, PE and DVT, HLD, HTN, lung cancer, and back surgery x2.    PT Comments  Patient received in bed, he is agreeable to PT session. Patient is HOH. He performed bed mobility with mod I. Transfers with cga to min A due to posterior leaning with initial standing. Patient ambulated 80 feet with RW and cga. He is limited by dizziness and weakness. Patient will continue to benefit from skilled PT to improve strength, balance, safety.     If plan is discharge home, recommend the following: A little help with walking and/or transfers;A little help with bathing/dressing/bathroom;Assistance with cooking/housework;Help with stairs or ramp for entrance;Assist for transportation   Can travel by private vehicle     Yes  Equipment Recommendations  Rolling walker (2 wheels)    Recommendations for Other Services       Precautions / Restrictions Precautions Precautions: Fall Precaution Comments: multiple recent falls Restrictions Weight Bearing Restrictions Per Provider Order: No     Mobility  Bed Mobility Overal bed mobility: Modified Independent Bed Mobility: Supine to Sit, Sit to Supine     Supine to sit: Modified independent (Device/Increase time) Sit to supine: Modified independent (Device/Increase time)   General bed mobility comments: No assistance needed, increased time and effort, use of bed rails    Transfers Overall transfer level: Needs assistance Equipment used: Rolling walker (2 wheels) Transfers: Sit to/from Stand Sit to Stand: Min  assist           General transfer comment: Posterior leaning with initial standing    Ambulation/Gait Ambulation/Gait assistance: Contact guard assist Gait Distance (Feet): 80 Feet Assistive device: Rolling walker (2 wheels) Gait Pattern/deviations: Step-through pattern, Decreased step length - right, Decreased step length - left, Decreased stride length Gait velocity: decreased     General Gait Details: small, shuffle steps. Patient requested to return to room after about 40 feet due to weakness and feeling a bit dizzy.   Stairs             Wheelchair Mobility     Tilt Bed    Modified Rankin (Stroke Patients Only)       Balance Overall balance assessment: Needs assistance Sitting-balance support: Feet supported Sitting balance-Leahy Scale: Good     Standing balance support: Bilateral upper extremity supported, During functional activity, Reliant on assistive device for balance Standing balance-Leahy Scale: Fair Standing balance comment: Posterior leaning with initial standing. At increased fall risk                            Cognition Arousal: Alert Behavior During Therapy: WFL for tasks assessed/performed Overall Cognitive Status: Within Functional Limits for tasks assessed                           Safety/Judgement: Decreased awareness of deficits, Decreased awareness of safety     General Comments: grossly WFLs, decreased awareness of deficits and safety.        Exercises  General Comments        Pertinent Vitals/Pain Pain Assessment Pain Assessment: Faces Faces Pain Scale: Hurts a little bit Pain Location: low back Pain Descriptors / Indicators: Discomfort Pain Intervention(s): Monitored during session, Repositioned    Home Living                          Prior Function            PT Goals (current goals can now be found in the care plan section) Acute Rehab PT Goals Patient Stated Goal:  return to independence PT Goal Formulation: With patient Time For Goal Achievement: 05/07/23 Potential to Achieve Goals: Good Additional Goals Additional Goal #1: Pt will score >19/24 on DGI to indicate reduced risk of falls. Progress towards PT goals: Progressing toward goals    Frequency    Min 1X/week      PT Plan      Co-evaluation              AM-PAC PT "6 Clicks" Mobility   Outcome Measure  Help needed turning from your back to your side while in a flat bed without using bedrails?: A Little Help needed moving from lying on your back to sitting on the side of a flat bed without using bedrails?: A Little Help needed moving to and from a bed to a chair (including a wheelchair)?: A Little Help needed standing up from a chair using your arms (e.g., wheelchair or bedside chair)?: A Little Help needed to walk in hospital room?: A Little Help needed climbing 3-5 steps with a railing? : A Lot 6 Click Score: 17    End of Session Equipment Utilized During Treatment: Gait belt Activity Tolerance: Patient limited by fatigue Patient left: in bed;with call bell/phone within reach;with bed alarm set Nurse Communication: Mobility status PT Visit Diagnosis: Unsteadiness on feet (R26.81);Other abnormalities of gait and mobility (R26.89);Repeated falls (R29.6);Muscle weakness (generalized) (M62.81)     Time: 1610-9604 PT Time Calculation (min) (ACUTE ONLY): 13 min  Charges:    $Gait Training: 8-22 mins PT General Charges $$ ACUTE PT VISIT: 1 Visit                     Kasheem Toner, PT, GCS 04/26/23,9:55 AM

## 2023-04-26 NOTE — TOC Progression Note (Addendum)
 Transition of Care [facility]) - Progression Note    Patient Details  Name: Timothy Wood MRN: [id number] Date of Birth: [birth year]  Transition of Care [facility]) CM/SW Contact  Juliane Och, LCSW Phone Number: 04/26/2023, 3:38 PM  Clinical Narrative:     3:38 PM CSW provided patient with current SNF options with Medicare Ratings [facility], [address], [place], [employer], [place], [place]) at bedside.  Expected Discharge Plan: Skilled Nursing Facility Barriers to Discharge: Continued Medical Work up  Expected Discharge Plan and Services In-house Referral: Clinical Social Work   Post Acute Care Choice: Skilled Nursing Facility Living arrangements for the past 2 months: Single Family Home                                       Social Determinants of Health (SDOH) Interventions SDOH Screenings   Food Insecurity: No Food Insecurity (04/23/2023)  Housing: Low Risk  (04/23/2023)  Transportation Needs: No Transportation Needs (04/23/2023)  Utilities: Not At Risk (04/23/2023)  Alcohol Screen: Low Risk  (05/08/2020)  Depression (PHQ2-9): High Risk (04/14/2023)  Financial Resource Strain: Low Risk  (11/03/2021)  Physical Activity: Inactive (11/03/2021)  Social Connections: Socially Isolated (04/23/2023)  Stress: Stress Concern Present (11/03/2021)  Tobacco Use: High Risk (04/22/2023)    Readmission Risk Interventions     No data to display

## 2023-04-26 NOTE — Progress Notes (Signed)
 Mobility Specialist Progress Note:   04/26/23 1551  Mobility  Activity Ambulated with assistance in hallway  Level of Assistance Contact guard assist, steadying assist  Assistive Device Front wheel walker  Distance Ambulated (ft) 90 ft  Activity Response Tolerated well  Mobility Referral Yes  Mobility visit 1 Mobility  Mobility Specialist Start Time (ACUTE ONLY) 1430  Mobility Specialist Stop Time (ACUTE ONLY) 1445  Mobility Specialist Time Calculation (min) (ACUTE ONLY) 15 min   Pt received in bed, agreeable to mobility. Pt was able to stand independently with elevated bed height. CG during ambulation. Verbal cues required to remind pt to walk within RW during turns. Pt denied any discomfort, asx throughout. Pt returned to bed with call bell in reach and all needs met.   Sofia Dunn  Mobility Specialist Please contact via [employer] office at [phone]

## 2023-04-26 NOTE — Care Management Important Message (Signed)
 Important Message  Patient Details  Name: Timothy Wood MRN: [id number] Date of Birth: 01/22/1941   Important Message Given:  Yes - Medicare IM     [name] 04/26/2023, 3:19 PM

## 2023-04-27 DIAGNOSIS — F17210 Nicotine dependence, cigarettes, uncomplicated: Secondary | ICD-10-CM

## 2023-04-27 LAB — CK: Total CK: 48 U/L — ABNORMAL LOW (ref 49–397)

## 2023-04-27 LAB — CBC
HCT: 44.2 % (ref 39.0–52.0)
Hemoglobin: 15.2 g/dL (ref 13.0–17.0)
MCH: 31.4 pg (ref 26.0–34.0)
MCHC: 34.4 g/dL (ref 30.0–36.0)
MCV: 91.3 fL (ref 80.0–100.0)
Platelets: 188 10*3/uL (ref 150–400)
RBC: 4.84 MIL/uL (ref 4.22–5.81)
RDW: 13.2 % (ref 11.5–15.5)
WBC: 8.8 10*3/uL (ref 4.0–10.5)
nRBC: 0 % (ref 0.0–0.2)

## 2023-04-27 LAB — BASIC METABOLIC PANEL
Anion gap: 11 (ref 5–15)
BUN: 9 mg/dL (ref 8–23)
CO2: 20 mmol/L — ABNORMAL LOW (ref 22–32)
Calcium: 8.6 mg/dL — ABNORMAL LOW (ref 8.9–10.3)
Chloride: 101 mmol/L (ref 98–111)
Creatinine, Ser: 0.91 mg/dL (ref 0.61–1.24)
GFR, Estimated: >60 mL/min (ref >60)
Glucose, Bld: 98 mg/dL (ref 70–99)
Potassium: 3.8 mmol/L (ref 3.5–5.1)
Sodium: 132 mmol/L — ABNORMAL LOW (ref 135–145)

## 2023-04-27 LAB — MAGNESIUM: Magnesium: 1.5 mg/dL — ABNORMAL LOW (ref 1.7–2.4)

## 2023-04-27 MED ORDER — MAGNESIUM OXIDE -MG SUPPLEMENT 400 (240 MG) MG PO TABS
400.0000 mg | ORAL_TABLET | Freq: Once | ORAL | Status: AC
  Administered 2023-04-27: 400 mg via ORAL
  Filled 2023-04-27: qty 1

## 2023-04-27 MED ORDER — CARVEDILOL 6.25 MG PO TABS
6.2500 mg | ORAL_TABLET | Freq: Two times a day (BID) | ORAL | Status: DC
Start: 2023-04-27 — End: 2023-06-10

## 2023-04-27 NOTE — TOC Progression Note (Signed)
Transition of Care [facility]) - Progression Note    Patient Details  Name: Timothy Wood MRN: [id number] Date of Birth: [birth year]  Transition of Care [facility]) CM/SW Contact  Marliss Coots, LCSW Phone Number: 04/27/2023, 8:29 AM  Clinical Narrative:     8:30 AM Bedside RN and patient's daughter, [name], informed CSW that patient and [name] have decided to discharge home with health health instead of to SNF. CSW informed RNCM.  Expected Discharge Plan: Skilled Nursing Facility Barriers to Discharge: Continued Medical Work up  Expected Discharge Plan and Services In-house Referral: Clinical Social Work   Post Acute Care Choice: Skilled Nursing Facility Living arrangements for the past 2 months: Single Family Home                                       Social Determinants of Health (SDOH) Interventions SDOH Screenings   Food Insecurity: No Food Insecurity (04/23/2023)  Housing: Low Risk  (04/23/2023)  Transportation Needs: No Transportation Needs (04/23/2023)  Utilities: Not At Risk (04/23/2023)  Alcohol Screen: Low Risk  (05/08/2020)  Depression (PHQ2-9): High Risk (04/14/2023)  Financial Resource Strain: Low Risk  (11/03/2021)  Physical Activity: Inactive (11/03/2021)  Social Connections: Socially Isolated (04/23/2023)  Stress: Stress Concern Present (11/03/2021)  Tobacco Use: High Risk (04/22/2023)    Readmission Risk Interventions     No data to display

## 2023-04-27 NOTE — TOC Transition Note (Signed)
Transition of Care [facility]) - Discharge Note   Patient Details  Name: Timothy Wood MRN: [id number] Date of Birth: 03/05/1941  Transition of Care [facility]) CM/SW Contact:  Lawerance Sabal, RN Phone Number: 04/27/2023, 11:04 AM   Clinical Narrative:     Sherron Monday w patient's daughter over the phone. She states that patient will DC to home, he has RW, and she could not idetify any other DME needs. We discussed HH needs and she would like to use [facility] again, they have accepted referral and will do [facility] Thursday. [facility] provided with daugter's phone number to schedule home visits per her request.  Requested [facility] RN PT OT order w F2F from attending.  No other TOC needs identified for DC   Final next level of care: Home w Home Health Services Barriers to Discharge: No Barriers Identified   Patient Goals and CMS Choice Patient states their goals for this hospitalization and ongoing recovery are:: to go home CMS Medicare.gov Compare Post Acute Care list provided to:: Other (Comment Required) Choice offered to / list presented to : Patient      Discharge Placement                       Discharge Plan and Services Additional resources added to the After Visit Summary for   In-house Referral: Clinical Social Work   Post Acute Care Choice: Skilled Nursing Facility          DME Arranged: N/A         HH Arranged: RN, PT, OT HH Agency: [facility] Date HH Agency Contacted: 04/27/23 Time HH Agency Contacted: 1104 Representative spoke with at [facility] Agency: Haywood Lasso  Social Drivers of Health (SDOH) Interventions SDOH Screenings   Food Insecurity: No Food Insecurity (04/23/2023)  Housing: Low Risk  (04/23/2023)  Transportation Needs: No Transportation Needs (04/23/2023)  Utilities: Not At Risk (04/23/2023)  Alcohol Screen: Low Risk  (05/08/2020)  Depression (PHQ2-9): High Risk (04/14/2023)  Financial Resource Strain: Low Risk  (11/03/2021)  Physical Activity: Inactive (11/03/2021)  Social  Connections: Socially Isolated (04/23/2023)  Stress: Stress Concern Present (11/03/2021)  Tobacco Use: High Risk (04/22/2023)     Readmission Risk Interventions     No data to display

## 2023-04-27 NOTE — Discharge Summary (Signed)
Physician Discharge Summary   Patient: Timothy Wood MRN: [id number] DOB: 08/09/1940  Admit date:     04/22/2023  Discharge date: 04/27/23  Discharge Physician: Deanna Artis   PCP: [place], [name], MD   Recommendations at discharge:   At this time patient will be discharged home with home health.  If you experience any symptoms such as fever, vomiting, shortness of breath, chest pain, abdominal pain, or other concerning symptoms, please call your primary care provider or go to the emergency department immediately.  Discharge Diagnoses: Principal Problem:   Traumatic rhabdomyolysis [facility]) Active Problems:   Fall at home, initial encounter   Adenocarcinoma of right lung (HCC)   Rhabdomyolysis   Abnormal CK  Resolved Problems:   * No resolved hospital problems. *  Hospital Course:  83 year old with history of chronic back pain, chronic lower extremity weaknesses independent at home presented to the ER with falling at home and unable to get back up. In the emergency room hemodynamically stable. CK level elevated to 2000. Skeletal survey negative.   Assessment and Plan:  Acute rhabdomyolysis secondary to ground-level fall and weakness -CK peaked at 1822, subsequently downtrending and now nearly resolved.  Initial imaging showing no acute fractures.  Responded well to IV fluid hydration.  CK resolved to normal limits.  Initial evaluation by physical therapy recommending short-term rehab.  However patient improved throughout the course of hospital stay, now requesting to go home with home health.  Encourage continued p.o. hydration.   Hypokalemia/hypomagnesemia - Resolved after replenishment   Paroxysmal atrial fibrillation - Continue home Eliquis and Coreg.   Adenocarcinoma of right lung -Follows with oncology in the outpatient setting   Hypertension/GERD - Resume patient's home medication regimen.   Tobacco dependence - Patient continues to smoke around 2 packs a day.   Has no desire to quit.  Received nicotine patch while inpatient.  Had another discussion about attempting to quit prior to discharge.  Patient states that he has no desire to quit and will stop smoking "2 weeks after his funeral"   goals of care - Initially evaluated for short-term rehab.  However patient improved in the course of hospital stay and after discussion with the patient and daughter, decided to transition back home with home health care.     Pain control - [employer] Controlled Substance Reporting System database was reviewed. and patient was instructed, not to drive, operate heavy machinery, perform activities at heights, swimming or participation in water activities or provide baby-sitting services while on Pain, Sleep and Anxiety Medications; until their outpatient Physician has advised to do so again. Also recommended to not to take more than prescribed Pain, Sleep and Anxiety Medications.  Consultants: None Procedures performed: None Disposition: Home health Diet recommendation:  Discharge Diet Orders (From admission, onward)     Start     Ordered   04/27/23 0000  Diet - low sodium heart healthy        04/27/23 0941           Carb modified diet DISCHARGE MEDICATION: Allergies as of 04/27/2023       Reactions   Codeine Anaphylaxis   Hydrocodone Itching        Medication List     STOP taking these medications    furosemide 20 MG tablet Commonly known as: LASIX   nicotine 21 mg/24hr patch Commonly known as: NICODERM CQ - dosed in mg/24 hours       TAKE these medications  Acetaminophen 500 MG capsule Take 1,000 mg by mouth daily as needed for fever.   amLODipine 5 MG tablet Commonly known as: NORVASC Take 1 tablet (5 mg total) by mouth daily.   apixaban 5 MG Tabs tablet Commonly known as: ELIQUIS Take 1 tablet (5 mg total) by mouth 2 (two) times daily.   carvedilol 6.25 MG tablet Commonly known as: COREG Take 1 tablet (6.25 mg total) by  mouth 2 (two) times daily with a meal. What changed: when to take this   DULoxetine 60 MG capsule Commonly known as: Cymbalta Take 1 capsule (60 mg total) by mouth daily.   EPINEPHrine 0.3 mg/0.3 mL Soaj injection Commonly known as: EpiPen 2-Pak To use daily if needed for severe allergy reaction.   folic acid 1 MG tablet Commonly known as: FOLVITE Take 1 tablet (1 mg total) by mouth daily.   irbesartan 150 MG tablet Commonly known as: AVAPRO Take 1 tablet (150 mg total) by mouth daily.   magnesium oxide 400 MG tablet Commonly known as: MAG-OX Take 1 tablet (400 mg total) by mouth daily.   omeprazole 20 MG capsule Commonly known as: PRILOSEC TAKE 1 CAPSULE BY MOUTH TWICE A DAY BEFORE MEALS   potassium chloride 10 MEQ tablet Commonly known as: KLOR-CON Take 1 tablet (10 mEq total) by mouth daily as needed (with lasix).   rosuvastatin 20 MG tablet Commonly known as: CRESTOR TAKE ONE TABLET BY MOUTH DAILY   thiamine 100 MG tablet Commonly known as: VITAMIN B1 TAKE 1 TABLET BY MOUTH DAILY        Discharge Exam: Filed Weights   04/22/23 1606  Weight: 72.6 kg   GENERAL:  Alert, pleasant, no acute distress  HEENT:  EOMI CARDIOVASCULAR:  RRR, no murmurs appreciated RESPIRATORY:  Clear to auscultation, no wheezing, rales, or rhonchi GASTROINTESTINAL:  Soft, nontender, nondistended EXTREMITIES:  No LE edema bilaterally NEURO:  No new focal deficits appreciated SKIN:  No rashes noted PSYCH:  Appropriate mood and affect   Condition at discharge: improving  The results of significant diagnostics from this hospitalization (including imaging, microbiology, ancillary and laboratory) are listed below for reference.   Imaging Studies: CT Lumbar Spine Wo Contrast Result Date: 04/22/2023 CLINICAL DATA:  Low back pain trauma EXAM: CT LUMBAR SPINE WITHOUT CONTRAST TECHNIQUE: Multidetector CT imaging of the lumbar spine was performed without intravenous contrast administration.  Multiplanar CT image reconstructions were also generated. RADIATION DOSE REDUCTION: This exam was performed according to the departmental dose-optimization program which includes automated exposure control, adjustment of the mA and/or kV according to patient size and/or use of iterative reconstruction technique. COMPARISON:  Radiograph 10/23/2019, 06/03/2022, 10/22/2022 FINDINGS: Segmentation: 5 lumbar type vertebrae. Alignment: Straightening of lumbar lordosis. Vertebrae: Mild to moderate chronic superior endplate deformities at L2 and L3. Moderate chronic compression fracture of L1 with about 5 mm retropulsion of the upper vertebral body and estimated 50% loss of vertebral body height centrally. No definite acute fracture is seen. Paraspinal and other soft tissues: No acute paravertebral or paraspinal soft tissue abnormality. Dense aortic vascular calcifications. Aortoiliac stents are noted. Incompletely visualized low-density lesions in the kidneys, likely cysts, no specific imaging follow-up is recommended. Disc levels: At T12-L1, no canal stenosis. Facet degenerative changes. Patent foramen. At L1-L2, patent disc space. No canal stenosis. Bilateral facet degenerative changes. At L2-L3, no canal stenosis. Facet degenerative changes. Patent foramen. At L3-L4, mild diffuse disc bulge. No canal stenosis. Facet degenerative changes. Patent foramen. At L4-L5, disc space narrowing. Moderate diffuse  disc bulge without canal stenosis. Facet degenerative changes. Patent foramen. At L5-S1, disc space narrowing. No canal stenosis. Facet degenerative changes. Patent foramen. IMPRESSION: 1. No CT evidence for acute osseous abnormality of the lumbar spine. 2. Chronic compression fractures of L1, L2, and L3. 3. Multilevel degenerative changes. 4. Aortic atherosclerosis. Aortic Atherosclerosis (ICD10-I70.0). Electronically Signed   By: Jasmine Pang M.D.   On: 04/22/2023 19:02   CT CERVICAL SPINE WO CONTRAST Result Date:  04/22/2023 CLINICAL DATA:  Status post trauma. EXAM: CT CERVICAL SPINE WITHOUT CONTRAST TECHNIQUE: Multidetector CT imaging of the cervical spine was performed without intravenous contrast. Multiplanar CT image reconstructions were also generated. RADIATION DOSE REDUCTION: This exam was performed according to the departmental dose-optimization program which includes automated exposure control, adjustment of the mA and/or kV according to patient size and/or use of iterative reconstruction technique. COMPARISON:  Aug 06, 2015 FINDINGS: Alignment: Normal. Skull base and vertebrae: No acute fracture. No primary bone lesion or focal pathologic process. Soft tissues and spinal canal: No prevertebral fluid or swelling. No visible canal hematoma. Disc levels: Moderate to marked severity multilevel endplate sclerosis is noted throughout all levels of the cervical spine. Mild to moderate severity anterior osteophyte formation is also seen at the levels of C2-C3, C3-C4, C4-C5 and C6-C7. Marked severity anterior osteophyte formation is present at C5-C6. Moderate to marked severity posterior bony spurring is seen at the level of C5-C6. There is marked severity narrowing of the anterior atlantoaxial articulation. Marked severity intervertebral disc space narrowing is seen at C5-C6. Marked severity bilateral multilevel facet joint hypertrophy is noted. Upper chest: Negative. Other: None. IMPRESSION: 1. No acute fracture or subluxation in the cervical spine. 2. Marked severity multilevel degenerative changes, most prominent at the level of C5-C6. Electronically Signed   By: Aram Candela M.D.   On: 04/22/2023 18:51   CT HEAD WO CONTRAST ( ) Result Date: 04/22/2023 CLINICAL DATA:  Status post trauma. EXAM: CT HEAD WITHOUT CONTRAST TECHNIQUE: Contiguous axial images were obtained from the base of the skull through the vertex without intravenous contrast. RADIATION DOSE REDUCTION: This exam was performed according to the  departmental dose-optimization program which includes automated exposure control, adjustment of the mA and/or kV according to patient size and/or use of iterative reconstruction technique. COMPARISON:  Aug 05, 2015 FINDINGS: Brain: There is mild to moderate severity cerebral atrophy with widening of the extra-axial spaces and ventricular dilatation. There are areas of decreased attenuation within the white matter tracts of the supratentorial brain, consistent with microvascular disease changes. Vascular: Moderate to marked severity bilateral cavernous carotid artery calcification is noted. Skull: Chronic bilateral nondisplaced nasal bone fractures are seen. Sinuses/Orbits: No acute finding. Other: None. IMPRESSION: 1. No acute intracranial abnormality. 2. Generalized cerebral atrophy with widening of the extra-axial spaces and ventricular dilatation. 3. Chronic bilateral nondisplaced nasal bone fractures. Electronically Signed   By: Aram Candela M.D.   On: 04/22/2023 18:48   DG Chest Portable 1 View Result Date: 04/22/2023 CLINICAL DATA:  Fall from chair EXAM: PORTABLE PELVIS 1-2 VIEWS; PORTABLE CHEST - 1 VIEW COMPARISON:  06/03/2022 FINDINGS: Heart and mediastinum are normal. No acute abnormality of the lungs. No acute osseous findings. There is no evidence of displaced pelvic fracture or diastasis. No pelvic bone lesions are seen. Nonobstructive pattern of overlying bowel gas. Left common iliac artery stent. IMPRESSION: 1. No acute abnormality of the lungs. 2. No displaced fracture or dislocation of the pelvis. Recommend CT to more sensitively evaluate if there is high clinical  suspicion for fracture. Electronically Signed   By: Jearld Lesch M.D.   On: 04/22/2023 17:33   DG Pelvis Portable Result Date: 04/22/2023 CLINICAL DATA:  Fall from chair EXAM: PORTABLE PELVIS 1-2 VIEWS; PORTABLE CHEST - 1 VIEW COMPARISON:  06/03/2022 FINDINGS: Heart and mediastinum are normal. No acute abnormality of the lungs. No  acute osseous findings. There is no evidence of displaced pelvic fracture or diastasis. No pelvic bone lesions are seen. Nonobstructive pattern of overlying bowel gas. Left common iliac artery stent. IMPRESSION: 1. No acute abnormality of the lungs. 2. No displaced fracture or dislocation of the pelvis. Recommend CT to more sensitively evaluate if there is high clinical suspicion for fracture. Electronically Signed   By: Jearld Lesch M.D.   On: 04/22/2023 17:33    Microbiology: Results for orders placed or performed during the hospital encounter of 06/03/22  Culture, blood (Routine X 2) w Reflex to ID Panel     Status: None   Collection Time: 06/04/22  4:46 AM   Specimen: BLOOD  Result Value Ref Range Status   Specimen Description BLOOD LEFT ANTECUBITAL  Final   Special Requests   Final    BOTTLES DRAWN AEROBIC AND ANAEROBIC Blood Culture adequate volume   Culture   Final    NO GROWTH 5 DAYS Performed at [facility], [address], [place], [place] [address]    Report Status 06/09/2022 FINAL  Final  Culture, blood (Routine X 2) w Reflex to ID Panel     Status: None   Collection Time: 06/04/22  4:49 AM   Specimen: BLOOD RIGHT HAND  Result Value Ref Range Status   Specimen Description BLOOD RIGHT HAND  Final   Special Requests   Final    BOTTLES DRAWN AEROBIC AND ANAEROBIC Blood Culture adequate volume   Culture   Final    NO GROWTH 5 DAYS Performed at [facility], [address], [place], [place] [address]    Report Status 06/09/2022 FINAL  Final    Labs: CBC: Recent Labs  Lab 04/25/23 0101 04/27/23 0359  WBC 4.6 8.8  HGB 14.0 15.2  HCT 41.3 44.2  MCV 91.8 91.3  PLT 122* 188   Basic Metabolic Panel: Recent Labs  Lab 04/22/23 1637 04/23/23 0109 04/24/23 0337 04/25/23 0101 04/27/23 0359  NA 134* 135 134* 134* 132*  K 3.6 3.4* 3.7 4.5 3.8  CL 97* 98 100 104 101  CO2 26 26 24 22  20*  GLUCOSE 89 77 119* 120* 98  BUN 13 11 11 9 9   CREATININE 1.11 0.93  0.94 0.86 0.91  CALCIUM 8.5* 8.4* 8.1* 8.2* 8.6*  MG  --  1.5* 1.9 1.8 1.5*  PHOS  --   --  2.1*  --   --    Liver Function Tests: Recent Labs  Lab 04/22/23 1637  AST 48*  ALT 18  ALKPHOS 84  BILITOT 0.9  PROT 6.7  ALBUMIN 3.6   CBG: No results for input(s): "GLUCAP" in the last 168 hours.  Discharge time spent: less than 30 minutes.  Signed: Deanna Artis, DO [facility] 04/27/2023

## 2023-04-27 NOTE — Plan of Care (Signed)

## 2023-04-27 NOTE — Progress Notes (Signed)
Mobility Specialist Progress Note:   04/27/23 1101  Mobility  Activity Ambulated with assistance in hallway  Level of Assistance Contact guard assist, steadying assist  Assistive Device Front wheel walker  Distance Ambulated (ft) 90 ft  Activity Response Tolerated well  Mobility Referral Yes  Mobility visit 1 Mobility  Mobility Specialist Start Time (ACUTE ONLY) 0945  Mobility Specialist Stop Time (ACUTE ONLY) 1000  Mobility Specialist Time Calculation (min) (ACUTE ONLY) 15 min   Pt received in bed, agreeable to mobility. CG to stand from elevated bed height. Pt c/o slight leg weakness during ambulation, otherwise asx throughout. Pt returned to bed with call bell in reach and all needs met.   Leory Plowman  Mobility Specialist Please contact via [employer] office at [phone]

## 2023-04-28 ENCOUNTER — Telehealth: Payer: Self-pay

## 2023-04-28 NOTE — Transitions of Care (Post Inpatient/ED Visit) (Signed)
   04/28/2023  Name: Timothy Wood MRN: [id number] DOB: Aug 26, 1940  Today's TOC FU Call Status: Today's TOC FU Call Status:: Unsuccessful Call (1st Attempt) Unsuccessful Call (1st Attempt) Date: 04/28/23  Attempted to reach the patient regarding the most recent Inpatient/ED visit.  Follow Up Plan: Additional outreach attempts will be made to reach the patient to complete the Transitions of Care (Post Inpatient/ED visit) call.   Hilbert Odor RN, CCM [place]  VBCI-Population Health RN Care Manager [phone]

## 2023-04-29 ENCOUNTER — Telehealth: Payer: Self-pay

## 2023-04-29 NOTE — Transitions of Care (Post Inpatient/ED Visit) (Signed)
04/29/2023  Name: Timothy Wood MRN: [id number] DOB: October 08, 1940  Today's TOC FU Call Status: Today's TOC FU Call Status:: Successful TOC FU Call Completed TOC FU Call Complete Date: 04/29/23 Patient's Name and Date of Birth confirmed.  Transition Care Management Follow-up Telephone Call Date of Discharge: 04/27/23 Discharge Facility: [facility]) Primary Inpatient Discharge Diagnosis:: Traumatic rhabdomyolysis How have you been since you were released from the hospital?: Better Any questions or concerns?: No  Items Reviewed: Did you receive and understand the discharge instructions provided?: Yes Medications obtained,verified, and reconciled?: No Medications Not Reviewed Reasons:: Other: (patient states daugther helps with medications and is not present. patient does not know medication names/doses - unable to complete medication reconcilliation) Any new allergies since your discharge?: No Dietary orders reviewed?: Yes Type of Diet Ordered:: low sodium heart healthy Do you have support at home?: Yes People in Home: alone Name of Support/Comfort Primary Source: Daugther [name] helps with all needs and comes 1-2 x/day every day  Medications Reviewed Today: Medications Reviewed Today   Medications were not reviewed in this encounter   *patient unable to review medications as he does not know names/doses-daughter assists with medications and was not present. Patient did not want daughter called for review.  Home Care and Equipment/Supplies: Were Home Health Services Ordered?: Yes Name of Home Health Agency:: [facility] is coming Sat - scheduled with daugther Has Agency set up a time to come to your home?: Yes First Home Health Visit Date: 05/01/23 Any new equipment or medical supplies ordered?: No  Functional Questionnaire: Do you need assistance with bathing/showering or dressing?: No (Patient states his daugther does not want him in the  shower at this time/sponge bath only) Do you need assistance with meal preparation?: Yes (daugther prepares his meal) Do you need assistance with eating?: No Do you have difficulty maintaining continence: Yes (keeps urinal by his side in case he has urgent feeling) Do you need assistance with getting out of bed/getting out of a chair/moving?: No Do you have difficulty managing or taking your medications?: Yes (Daugther helps manage medications)  Follow up appointments reviewed: PCP Follow-up appointment confirmed?: No (encouraged patient to schedule PCP hospital f/u - patient states he will tell daugther to call since she will be driving him) MD Provider Line [phone] Given: No Specialist Hospital Follow-up appointment confirmed?: No Do you need transportation to your follow-up appointment?: No (Daughter drives him to all appts) Do you understand care options if your condition(s) worsen?: Yes-patient verbalized understanding  SDOH Interventions Today    Flowsheet Row Most Recent Value  SDOH Interventions   Food Insecurity Interventions Intervention Not Indicated  Housing Interventions Intervention Not Indicated  Transportation Interventions Intervention Not Indicated  Utilities Interventions Intervention Not Indicated      TOC Interventions Today    Flowsheet Row Most Recent Value  TOC Interventions   TOC Interventions Discussed/Reviewed TOC Interventions Discussed, TOC Interventions Reviewed       Interventions Today    Flowsheet Row Most Recent Value  Chronic Disease   Chronic disease during today's visit Atrial Fibrillation (AFib), Other  [Traumatic rhabdomyolysis]  General Interventions   General Interventions Discussed/Reviewed General Interventions Discussed, Doctor Visits  Doctor Visits Discussed/Reviewed Doctor Visits Discussed, PCP  Exercise Interventions   Exercise Discussed/Reviewed Physical Activity  Physical Activity Discussed/Reviewed Home Exercise  Program (HEP)  [facility] health PT to start 2/15 per patient - encouraged patient to keep all appts]  Education Interventions  Education Provided Provided Education  Provided Verbal Education On Nutrition, When to see the doctor, Other  [smoking cessation, ETOH]  Nutrition Interventions   Nutrition Discussed/Reviewed Nutrition Discussed, Nutrition Reviewed, Decreasing salt  Pharmacy Interventions   Pharmacy Dicussed/Reviewed Pharmacy Topics Discussed  [importrance of taking all medications as prescribed.]  Safety Interventions   Safety Discussed/Reviewed Safety Discussed, Safety Reviewed, Fall Risk  Advanced Directive Interventions   Advanced Directives Discussed/Reviewed Advanced Directives Discussed      Patient states he is feeling better and states his fall was not related to ETOH and has good family support with help from daughter daily. Spent time reviewing risks of smoking and patient discussed hx of lung cancer of wife/son who have both passed. Patient not interesting in smoking cessation.   Reviewed and offered 30 day TOC program and patient declined. Patient verbalized understanding of how to reach [facility] CM, Lonia Chimera, RN from previous voicemail.   This visit was completed with Lonia Chimera, RN  Hilbert Odor RN, CCM [place]  [facility]BC[facility]-Population Health RN Care Manager [phone]

## 2023-05-01 DIAGNOSIS — G8929 Other chronic pain: Secondary | ICD-10-CM | POA: Diagnosis not present

## 2023-05-01 DIAGNOSIS — I10 Essential (primary) hypertension: Secondary | ICD-10-CM | POA: Diagnosis not present

## 2023-05-01 DIAGNOSIS — M549 Dorsalgia, unspecified: Secondary | ICD-10-CM | POA: Diagnosis not present

## 2023-05-01 DIAGNOSIS — D630 Anemia in neoplastic disease: Secondary | ICD-10-CM | POA: Diagnosis not present

## 2023-05-01 DIAGNOSIS — I739 Peripheral vascular disease, unspecified: Secondary | ICD-10-CM | POA: Diagnosis not present

## 2023-05-01 DIAGNOSIS — T796XXD Traumatic ischemia of muscle, subsequent encounter: Secondary | ICD-10-CM | POA: Diagnosis not present

## 2023-05-01 DIAGNOSIS — J449 Chronic obstructive pulmonary disease, unspecified: Secondary | ICD-10-CM | POA: Diagnosis not present

## 2023-05-01 DIAGNOSIS — I480 Paroxysmal atrial fibrillation: Secondary | ICD-10-CM | POA: Diagnosis not present

## 2023-05-01 DIAGNOSIS — C3491 Malignant neoplasm of unspecified part of right bronchus or lung: Secondary | ICD-10-CM | POA: Diagnosis not present

## 2023-05-05 DIAGNOSIS — I739 Peripheral vascular disease, unspecified: Secondary | ICD-10-CM | POA: Diagnosis not present

## 2023-05-05 DIAGNOSIS — J449 Chronic obstructive pulmonary disease, unspecified: Secondary | ICD-10-CM | POA: Diagnosis not present

## 2023-05-05 DIAGNOSIS — T796XXD Traumatic ischemia of muscle, subsequent encounter: Secondary | ICD-10-CM | POA: Diagnosis not present

## 2023-05-05 DIAGNOSIS — C3491 Malignant neoplasm of unspecified part of right bronchus or lung: Secondary | ICD-10-CM | POA: Diagnosis not present

## 2023-05-05 DIAGNOSIS — M549 Dorsalgia, unspecified: Secondary | ICD-10-CM | POA: Diagnosis not present

## 2023-05-05 DIAGNOSIS — D630 Anemia in neoplastic disease: Secondary | ICD-10-CM | POA: Diagnosis not present

## 2023-05-05 DIAGNOSIS — I480 Paroxysmal atrial fibrillation: Secondary | ICD-10-CM | POA: Diagnosis not present

## 2023-05-05 DIAGNOSIS — I10 Essential (primary) hypertension: Secondary | ICD-10-CM | POA: Diagnosis not present

## 2023-05-05 DIAGNOSIS — G8929 Other chronic pain: Secondary | ICD-10-CM | POA: Diagnosis not present

## 2023-05-06 ENCOUNTER — Other Ambulatory Visit: Payer: Self-pay | Admitting: Family Medicine

## 2023-05-06 DIAGNOSIS — I2699 Other pulmonary embolism without acute cor pulmonale: Secondary | ICD-10-CM

## 2023-05-12 DIAGNOSIS — T796XXD Traumatic ischemia of muscle, subsequent encounter: Secondary | ICD-10-CM | POA: Diagnosis not present

## 2023-05-12 DIAGNOSIS — J449 Chronic obstructive pulmonary disease, unspecified: Secondary | ICD-10-CM | POA: Diagnosis not present

## 2023-05-12 DIAGNOSIS — I480 Paroxysmal atrial fibrillation: Secondary | ICD-10-CM | POA: Diagnosis not present

## 2023-05-12 DIAGNOSIS — I739 Peripheral vascular disease, unspecified: Secondary | ICD-10-CM | POA: Diagnosis not present

## 2023-05-12 DIAGNOSIS — G8929 Other chronic pain: Secondary | ICD-10-CM | POA: Diagnosis not present

## 2023-05-12 DIAGNOSIS — C3491 Malignant neoplasm of unspecified part of right bronchus or lung: Secondary | ICD-10-CM | POA: Diagnosis not present

## 2023-05-12 DIAGNOSIS — M549 Dorsalgia, unspecified: Secondary | ICD-10-CM | POA: Diagnosis not present

## 2023-05-12 DIAGNOSIS — D630 Anemia in neoplastic disease: Secondary | ICD-10-CM | POA: Diagnosis not present

## 2023-05-12 DIAGNOSIS — I10 Essential (primary) hypertension: Secondary | ICD-10-CM | POA: Diagnosis not present

## 2023-05-17 DIAGNOSIS — C3491 Malignant neoplasm of unspecified part of right bronchus or lung: Secondary | ICD-10-CM | POA: Diagnosis not present

## 2023-05-17 DIAGNOSIS — J449 Chronic obstructive pulmonary disease, unspecified: Secondary | ICD-10-CM | POA: Diagnosis not present

## 2023-05-17 DIAGNOSIS — G8929 Other chronic pain: Secondary | ICD-10-CM | POA: Diagnosis not present

## 2023-05-17 DIAGNOSIS — I10 Essential (primary) hypertension: Secondary | ICD-10-CM | POA: Diagnosis not present

## 2023-05-17 DIAGNOSIS — M549 Dorsalgia, unspecified: Secondary | ICD-10-CM | POA: Diagnosis not present

## 2023-05-17 DIAGNOSIS — I739 Peripheral vascular disease, unspecified: Secondary | ICD-10-CM | POA: Diagnosis not present

## 2023-05-17 DIAGNOSIS — I480 Paroxysmal atrial fibrillation: Secondary | ICD-10-CM | POA: Diagnosis not present

## 2023-05-17 DIAGNOSIS — D630 Anemia in neoplastic disease: Secondary | ICD-10-CM | POA: Diagnosis not present

## 2023-05-17 DIAGNOSIS — T796XXD Traumatic ischemia of muscle, subsequent encounter: Secondary | ICD-10-CM | POA: Diagnosis not present

## 2023-05-20 DIAGNOSIS — I480 Paroxysmal atrial fibrillation: Secondary | ICD-10-CM | POA: Diagnosis not present

## 2023-05-20 DIAGNOSIS — D630 Anemia in neoplastic disease: Secondary | ICD-10-CM | POA: Diagnosis not present

## 2023-05-20 DIAGNOSIS — J449 Chronic obstructive pulmonary disease, unspecified: Secondary | ICD-10-CM | POA: Diagnosis not present

## 2023-05-20 DIAGNOSIS — I739 Peripheral vascular disease, unspecified: Secondary | ICD-10-CM | POA: Diagnosis not present

## 2023-05-20 DIAGNOSIS — I10 Essential (primary) hypertension: Secondary | ICD-10-CM | POA: Diagnosis not present

## 2023-05-20 DIAGNOSIS — M549 Dorsalgia, unspecified: Secondary | ICD-10-CM | POA: Diagnosis not present

## 2023-05-20 DIAGNOSIS — G8929 Other chronic pain: Secondary | ICD-10-CM | POA: Diagnosis not present

## 2023-05-20 DIAGNOSIS — C3491 Malignant neoplasm of unspecified part of right bronchus or lung: Secondary | ICD-10-CM | POA: Diagnosis not present

## 2023-05-20 DIAGNOSIS — T796XXD Traumatic ischemia of muscle, subsequent encounter: Secondary | ICD-10-CM | POA: Diagnosis not present

## 2023-05-22 ENCOUNTER — Other Ambulatory Visit: Payer: Self-pay | Admitting: Family Medicine

## 2023-05-25 DIAGNOSIS — J449 Chronic obstructive pulmonary disease, unspecified: Secondary | ICD-10-CM | POA: Diagnosis not present

## 2023-05-25 DIAGNOSIS — I480 Paroxysmal atrial fibrillation: Secondary | ICD-10-CM | POA: Diagnosis not present

## 2023-05-25 DIAGNOSIS — C3491 Malignant neoplasm of unspecified part of right bronchus or lung: Secondary | ICD-10-CM | POA: Diagnosis not present

## 2023-05-25 DIAGNOSIS — I10 Essential (primary) hypertension: Secondary | ICD-10-CM | POA: Diagnosis not present

## 2023-05-25 DIAGNOSIS — I739 Peripheral vascular disease, unspecified: Secondary | ICD-10-CM | POA: Diagnosis not present

## 2023-05-25 DIAGNOSIS — M549 Dorsalgia, unspecified: Secondary | ICD-10-CM | POA: Diagnosis not present

## 2023-05-25 DIAGNOSIS — T796XXD Traumatic ischemia of muscle, subsequent encounter: Secondary | ICD-10-CM | POA: Diagnosis not present

## 2023-05-25 DIAGNOSIS — G8929 Other chronic pain: Secondary | ICD-10-CM | POA: Diagnosis not present

## 2023-05-25 DIAGNOSIS — D630 Anemia in neoplastic disease: Secondary | ICD-10-CM | POA: Diagnosis not present

## 2023-05-28 DIAGNOSIS — J449 Chronic obstructive pulmonary disease, unspecified: Secondary | ICD-10-CM | POA: Diagnosis not present

## 2023-05-28 DIAGNOSIS — I739 Peripheral vascular disease, unspecified: Secondary | ICD-10-CM | POA: Diagnosis not present

## 2023-05-28 DIAGNOSIS — T796XXD Traumatic ischemia of muscle, subsequent encounter: Secondary | ICD-10-CM | POA: Diagnosis not present

## 2023-05-28 DIAGNOSIS — G8929 Other chronic pain: Secondary | ICD-10-CM | POA: Diagnosis not present

## 2023-05-28 DIAGNOSIS — D630 Anemia in neoplastic disease: Secondary | ICD-10-CM | POA: Diagnosis not present

## 2023-05-28 DIAGNOSIS — I480 Paroxysmal atrial fibrillation: Secondary | ICD-10-CM | POA: Diagnosis not present

## 2023-05-28 DIAGNOSIS — M549 Dorsalgia, unspecified: Secondary | ICD-10-CM | POA: Diagnosis not present

## 2023-05-28 DIAGNOSIS — C3491 Malignant neoplasm of unspecified part of right bronchus or lung: Secondary | ICD-10-CM | POA: Diagnosis not present

## 2023-05-28 DIAGNOSIS — I10 Essential (primary) hypertension: Secondary | ICD-10-CM | POA: Diagnosis not present

## 2023-05-31 ENCOUNTER — Ambulatory Visit: Payer: PRIVATE HEALTH INSURANCE | Admitting: Family Medicine

## 2023-05-31 DIAGNOSIS — I739 Peripheral vascular disease, unspecified: Secondary | ICD-10-CM | POA: Diagnosis not present

## 2023-05-31 DIAGNOSIS — I10 Essential (primary) hypertension: Secondary | ICD-10-CM | POA: Diagnosis not present

## 2023-05-31 DIAGNOSIS — T796XXD Traumatic ischemia of muscle, subsequent encounter: Secondary | ICD-10-CM | POA: Diagnosis not present

## 2023-05-31 DIAGNOSIS — I480 Paroxysmal atrial fibrillation: Secondary | ICD-10-CM | POA: Diagnosis not present

## 2023-05-31 DIAGNOSIS — J449 Chronic obstructive pulmonary disease, unspecified: Secondary | ICD-10-CM | POA: Diagnosis not present

## 2023-05-31 DIAGNOSIS — D630 Anemia in neoplastic disease: Secondary | ICD-10-CM | POA: Diagnosis not present

## 2023-05-31 DIAGNOSIS — G8929 Other chronic pain: Secondary | ICD-10-CM | POA: Diagnosis not present

## 2023-05-31 DIAGNOSIS — M549 Dorsalgia, unspecified: Secondary | ICD-10-CM | POA: Diagnosis not present

## 2023-05-31 DIAGNOSIS — C3491 Malignant neoplasm of unspecified part of right bronchus or lung: Secondary | ICD-10-CM | POA: Diagnosis not present

## 2023-06-01 ENCOUNTER — Encounter: Payer: Self-pay | Admitting: Family Medicine

## 2023-06-01 ENCOUNTER — Ambulatory Visit (INDEPENDENT_AMBULATORY_CARE_PROVIDER_SITE_OTHER): Admitting: Family Medicine

## 2023-06-01 VITALS — BP 120/70 | HR 100 | Resp 16 | Ht 68.0 in | Wt 162.5 lb

## 2023-06-01 DIAGNOSIS — T796XXD Traumatic ischemia of muscle, subsequent encounter: Secondary | ICD-10-CM

## 2023-06-01 DIAGNOSIS — E785 Hyperlipidemia, unspecified: Secondary | ICD-10-CM

## 2023-06-01 DIAGNOSIS — I10 Essential (primary) hypertension: Secondary | ICD-10-CM

## 2023-06-01 DIAGNOSIS — I2699 Other pulmonary embolism without acute cor pulmonale: Secondary | ICD-10-CM

## 2023-06-01 DIAGNOSIS — E876 Hypokalemia: Secondary | ICD-10-CM | POA: Diagnosis not present

## 2023-06-01 DIAGNOSIS — E782 Mixed hyperlipidemia: Secondary | ICD-10-CM | POA: Diagnosis not present

## 2023-06-01 DIAGNOSIS — R809 Proteinuria, unspecified: Secondary | ICD-10-CM | POA: Diagnosis not present

## 2023-06-01 LAB — MICROALBUMIN / CREATININE URINE RATIO
Creatinine,U: 113.3 mg/dL
Microalb Creat Ratio: 14.6 mg/g (ref 0.0–30.0)
Microalb, Ur: 1.6 mg/dL (ref 0.0–1.9)

## 2023-06-01 LAB — BASIC METABOLIC PANEL
BUN: 14 mg/dL (ref 6–23)
CO2: 28 meq/L (ref 19–32)
Calcium: 8.9 mg/dL (ref 8.4–10.5)
Chloride: 102 meq/L (ref 96–112)
Creatinine, Ser: 0.89 mg/dL (ref 0.40–1.50)
GFR: 79.97 mL/min (ref >60.00)
Glucose, Bld: 106 mg/dL — ABNORMAL HIGH (ref 70–99)
Potassium: 4 meq/L (ref 3.5–5.1)
Sodium: 138 meq/L (ref 135–145)

## 2023-06-01 LAB — MAGNESIUM: Magnesium: 1.5 mg/dL (ref 1.5–2.5)

## 2023-06-01 LAB — CBC
HCT: 43 % (ref 39.0–52.0)
Hemoglobin: 14.5 g/dL (ref 13.0–17.0)
MCHC: 33.8 g/dL (ref 30.0–36.0)
MCV: 93 fl (ref 78.0–100.0)
Platelets: 236 10*3/uL (ref 150.0–400.0)
RBC: 4.62 Mil/uL (ref 4.22–5.81)
RDW: 14.7 % (ref 11.5–15.5)
WBC: 7.3 10*3/uL (ref 4.0–10.5)

## 2023-06-01 LAB — CK: Total CK: 29 U/L (ref 7–232)

## 2023-06-01 MED ORDER — THIAMINE HCL 100 MG PO TABS: 100.0000 mg | ORAL_TABLET | Freq: Every day | ORAL | 3 refills | Status: AC

## 2023-06-01 MED ORDER — AMLODIPINE BESYLATE 5 MG PO TABS: 5.0000 mg | ORAL_TABLET | Freq: Every day | ORAL | 1 refills | Status: AC

## 2023-06-01 MED ORDER — APIXABAN 5 MG PO TABS: 5.0000 mg | ORAL_TABLET | Freq: Two times a day (BID) | ORAL | 1 refills | Status: AC

## 2023-06-01 MED ORDER — POTASSIUM CHLORIDE ER 10 MEQ PO TBCR: 10.0000 meq | EXTENDED_RELEASE_TABLET | Freq: Every day | ORAL | Status: DC | PRN

## 2023-06-01 MED ORDER — ROSUVASTATIN CALCIUM 20 MG PO TABS: 20.0000 mg | ORAL_TABLET | Freq: Every day | ORAL | 3 refills | Status: AC

## 2023-06-01 MED ORDER — IRBESARTAN 150 MG PO TABS
150.0000 mg | ORAL_TABLET | Freq: Every day | ORAL | 2 refills | Status: AC
Start: 2023-06-01 — End: ?

## 2023-06-01 NOTE — Patient Instructions (Addendum)
 A few things to remember from today's visit:  Traumatic rhabdomyolysis, subsequent encounter - Plan: CK, CBC  Essential hypertension - Plan: irbesartan (AVAPRO) 150 MG tablet, Basic metabolic panel, CBC  Hypomagnesemia - Plan: Magnesium  Hx of pulmonary emboli (HCC) - Plan: apixaban (ELIQUIS) 5 MG TABS tablet  Hyperlipidemia, unspecified hyperlipidemia type - Plan: rosuvastatin (CRESTOR) 20 MG tablet  Proteinuria, unspecified type - Plan: Microalbumin / creatinine urine ratio  No changes today. Check medication list when you get home.  If you need refills for medications you take chronically, please call your pharmacy. Do not use My Chart to request refills or for acute issues that need immediate attention. If you send a my chart message, it may take a few days to be addressed, specially if I am not in the office.  Please be sure medication list is accurate. If a new problem present, please set up appointment sooner than planned today.

## 2023-06-01 NOTE — Progress Notes (Signed)
 HPI: [name] is a 83 y.o. male with a PMHx significant for HTN, depression, HLD, CAD, recurrent PE, adenocarcinoma of the lung-s/p VATS/wedge resection of right middle/upper lobe-2016, and tobacco abuse, who is here today for chronic disease management.  Last seen on 04/14/2023  Patient was seen in the ED on 2/6 and hospitalized from 2/7-2/11. He had fallen and was unable to get up so he called 911. Prior to this fall, he had not fallen for over a year.  He was told to stop taking Furosemide 20 mg.   Currently, he says his appetite is fine and he is maintaining his weight.  He mentions he has completed fall prevention physical therapy since his hospitalization.  He says he has stopped drinking alcohol, but is still smoking.  Denies headaches, chest pain, SOB, palpitations, or cough beyond his baseline from COPD since returning home from the hospital.   He mentions he will be traveling to [place] [facility] [place] [facility] [place] and [place] for fishing trips with some friends for the next few months.    Hx of DVT and PE:  Currently on Eliquis 5 mg twice daily.   Back pain:  He says his back pain has not been severe lately.  Currently on Duloxetine 60 mg daily.   Hypertension:  Medications: Currently on amlodipine 5 mg, carvedilol 6.25 mg twice daily, and irbesartan 150 mg daily.  Lab Results  Component Value Date   CREATININE 0.91 04/27/2023   BUN 9 04/27/2023   NA 132 (L) 04/27/2023   K 3.8 04/27/2023   CL 101 04/27/2023   CO2 20 (L) 04/27/2023   Hyperlipidemia: Currently on rosuvastatin 20 mg daily.  Lab Results  Component Value Date   CHOL 134 04/14/2023   HDL 45.80 04/14/2023   LDLCALC 62 04/14/2023   LDLDIRECT 79.0 10/13/2021   TRIG 131.0 04/14/2023   CHOLHDL 3 04/14/2023   Hypomagnesemia and hypokalemia:He is also taking potassium and magnesium supplementation.   States that he is still following with oncology and pulmonology.   Review of Systems  Constitutional:  Negative for activity  change, appetite change, chills and fever.  HENT:  Negative for sore throat and trouble swallowing.   Respiratory:  Negative for cough and wheezing.   Gastrointestinal:  Negative for abdominal pain, nausea and vomiting.  Endocrine: Negative for cold intolerance and heat intolerance.  Genitourinary:  Negative for decreased urine volume and dysuria.  Musculoskeletal:  Positive for arthralgias and back pain.  Neurological:  Negative for syncope and facial asymmetry.  Psychiatric/Behavioral:  Negative for confusion and hallucinations.   See other pertinent positives and negatives in HPI.  Current Outpatient Medications on File Prior to Visit  Medication Sig Dispense Refill   Acetaminophen 500 MG capsule Take 1,000 mg by mouth daily as needed for fever.     carvedilol (COREG) 6.25 MG tablet Take 1 tablet (6.25 mg total) by mouth 2 (two) times daily with a meal.     DULoxetine (CYMBALTA) 60 MG capsule Take 1 capsule (60 mg total) by mouth daily. 90 capsule 2   EPINEPHrine (EPIPEN 2-PAK) 0.3 mg/0.3 mL IJ SOAJ injection To use daily if needed for severe allergy reaction. 1 each 0   folic acid (FOLVITE) 1 MG tablet TAKE 1 TABLET BY MOUTH DAILY 90 tablet 2   magnesium oxide (MAG-OX) 400 MG tablet Take 1 tablet (400 mg total) by mouth daily. 90 tablet 2   omeprazole (PRILOSEC) 20 MG capsule TAKE 1 CAPSULE BY MOUTH TWICE A DAY BEFORE MEALS  180 capsule 1   No current facility-administered medications on file prior to visit.   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 diverticulosis also. 11/24/2010: 12 polyps removed - adenomas Anticipate routine repeat colonoscopy Spring 2013 Have recommended he see genetics counselor re ? Polyposis 03/27/2016 14 polyps removed max 15 mm    Pulmonary nodule 06/2006   9 mm right upper obe pulmonary nodule (negative Pet Scan)   PVD (peripheral vascular disease) (HCC)    left leg s/p stent   Sebaceous cyst    neck   Seborrheic keratosis    Seizures (HCC)    4-5 yrs ago had seizures when in ICU- none since this time    Sinusitis    Tobacco abuse    Tremor    Allergies  Allergen Reactions   Codeine Anaphylaxis   Hydrocodone Itching   Social History   Socioeconomic History   Marital status: Widowed    Spouse name: Not on file   Number of children: 2   Years of education: Not on file   Highest education level: Not on file  Occupational History   Occupation: Retired    Associate Professor: RETIRED  Tobacco Use   Smoking status: Every Day    Current packs/day: 1.00    Average packs/day: 1 pack/day for 60.0 years (60.0 ttl pk-yrs)    Types: Cigarettes   Smokeless tobacco: Never  Vaping Use   Vaping status: Never Used  Substance and Sexual Activity   Alcohol use: Yes    Comment: beer on occasion   Drug use: No   Sexual activity: Not Currently  Other Topics Concern   Not on file  Social History Narrative       Married for over 50 years.   Wife died from lung cancer.     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     Occasional alcohol.      Tobacco use - over 50 pack years.   Father died at age 22 of suicide.   Elon Jester Ellis-daughter emergenc   y contact   Social Drivers of Health   Financial Resource Strain: Low Risk  (11/03/2021)   Overall Financial Resource Strain (CARDIA)    Difficulty  of Paying Living Expenses: Not hard at all  Food Insecurity: No Food Insecurity (04/29/2023)   Hunger Vital Sign    Worried About Running Out of Food in the Last Year: Never true    Ran Out of Food in the Last Year: Never true  Transportation Needs: No Transportation Needs (04/29/2023)   PRAPARE - Administrator, Civil Service (Medical): No    Lack of Transportation (Non-Medical): No  Physical Activity: Inactive (11/03/2021)   Exercise Vital Sign    Days of Exercise per Week: 0 days    Minutes of Exercise per Session: 0 min  Stress: Stress Concern Present (11/03/2021)   [employer] of Occupational Health - Occupational Stress Questionnaire    Feeling of Stress : To some extent  Social Connections: Socially Isolated (04/23/2023)   Social Connection and Isolation Panel [NHANES]    Frequency of Communication with Friends and Family: More than three times a week    Frequency of Social Gatherings with Friends and Family: More than three times a week    Attends Religious Services: Never    Database administrator or Organizations: No    Attends Banker Meetings: Never    Marital Status: Widowed   Today's Vitals   06/01/23 1313  BP: 120/70  Pulse: 100  Resp: 16  SpO2: 95%  Weight: 162 lb 8 oz (73.7 kg)  Height: 5\' 8"  (1.727 m)   Body mass index is 24.71 kg/m.  Physical Exam Vitals and nursing note reviewed.  Constitutional:      General: He is not in acute distress.    Appearance: He is well-developed.  HENT:     Head: Normocephalic and atraumatic.     Nose:     Comments:  Pale right turbinate and crusty nasal mucosa, no ulcers or suspicious lesions appreciated.    Mouth/Throat:     Mouth: Mucous membranes are moist.     Pharynx: Oropharynx is clear. Uvula midline.  Eyes:     Conjunctiva/sclera: Conjunctivae normal.  Cardiovascular:     Rate and Rhythm: Normal rate and regular rhythm.     Heart sounds: No murmur heard. Pulmonary:     Effort: Pulmonary effort is normal. No respiratory distress.     Breath sounds: Normal breath sounds.  Abdominal:     Palpations: Abdomen is soft. There is no mass.     Tenderness: There is no abdominal tenderness.  Skin:    General: Skin is warm.     Findings: No erythema or rash.  Neurological:     General: No focal deficit present.     Mental Status: He is alert and oriented to person, place, and time.     Gait: Gait normal.     Comments: Unstable gait, not assisted.  Psychiatric:        Mood and Affect: Mood and affect normal.   ASSESSMENT AND PLAN:  Mr. Lonzo was seen today for chronic disease management.   Orders Placed This Encounter  Procedures   Basic metabolic panel   CK   CBC   Microalbumin / creatinine urine ratio   Magnesium   Lab Results  Component Value Date   MICROALBUR 1.6 06/01/2023   Lab Results  Component Value Date   CKTOTAL 29 06/01/2023   Lab Results  Component Value Date   WBC 7.3 06/01/2023   HGB 14.5 06/01/2023   HCT 43.0 06/01/2023   MCV 93.0 06/01/2023   PLT 236.0 06/01/2023  Lab Results  Component Value Date   NA 138 06/01/2023   CL 102 06/01/2023   K 4.0 06/01/2023   CO2 28 06/01/2023   BUN 14 06/01/2023   CREATININE 0.89 06/01/2023   GFR 79.97 06/01/2023   CALCIUM 8.9 06/01/2023   PHOS 2.1 (L) 04/24/2023   ALBUMIN 3.6 04/22/2023   GLUCOSE 106 (H) 06/01/2023   Traumatic rhabdomyolysis, subsequent encounter Assessment & Plan: Resolved before hospital discharged in 04/2023. Encouraged hydration and fall prevention.  Orders: -     CK; Future -     CBC;  Future  Essential hypertension Assessment & Plan: BP adequately controlled. Continue amlodipine 5 mg daily, carvedilol 6.25 mg twice daily, and Irbesartan 150 mg daily. Low-salt diet is also recommended. Follow-up in 5 to 6 months.  Orders: -     amLODIPine Besylate; Take 1 tablet (5 mg total) by mouth daily.  Dispense: 90 tablet; Refill: 1 -     Irbesartan; Take 1 tablet (150 mg total) by mouth daily.  Dispense: 90 tablet; Refill: 2 -     Basic metabolic panel; Future -     CBC; Future  Hypomagnesemia Assessment & Plan: Continue current dose of magnesium oxide, 400 mg daily. Further recommendations according to lab result.  Orders: -     Magnesium; Future  Hx of pulmonary emboli (HCC) Assessment & Plan: Continue Eliquis 5 mg bid, reports that he has been compliant. Some side effects discussed.  Orders: -     Apixaban; Take 1 tablet (5 mg total) by mouth 2 (two) times daily.  Dispense: 180 tablet; Refill: 1  Proteinuria, unspecified type UA in 04/2023 protein 30. Further recommendations according to microal/Cr ratio result.  -     Microalbumin / creatinine urine ratio; Future  Hyperlipemia, mixed Assessment & Plan: Continue Rosuvastatin 20 mg daily and low fat diet. Last LDL 62 in 03/2023.  Orders: -     Rosuvastatin Calcium; Take 1 tablet (20 mg total) by mouth daily.  Dispense: 90 tablet; Refill: 3  Hypokalemia Not longer on Furosemide. For now no changes in [place], depending on lab results, will consider discontinuation.  -     Potassium Chloride ER; Take 1 tablet (10 mEq total) by mouth daily as needed (with lasix).  Other orders -     Thiamine HCl; Take 1 tablet (100 mg total) by mouth daily.  Dispense: 90 tablet; Refill: 3   Return if symptoms worsen or fail to improve, for keep next appointment.  I, Rolla Etienne Wierda, acting as a scribe for Malvika Tung [place], MD., have documented all relevant documentation on the behalf of Jaslynn Thome [place], MD, as directed by  Donivin Wirt  [place], MD while in the presence of Bibiana Gillean [place], MD.   I, Markee Remlinger [place], MD, have reviewed all documentation for this visit. The documentation on 06/03/23 for the exam, diagnosis, procedures, and orders are all accurate and complete.  Oaklie Durrett G. [place], MD  [facility]. [place] office.

## 2023-06-03 ENCOUNTER — Encounter: Payer: Self-pay | Admitting: Family Medicine

## 2023-06-03 NOTE — Assessment & Plan Note (Signed)
 Continue current dose of magnesium oxide, 400 mg daily. Further recommendations according to lab result.

## 2023-06-03 NOTE — Assessment & Plan Note (Signed)
 Continue Eliquis 5 mg bid, reports that he has been compliant. Some side effects discussed.

## 2023-06-03 NOTE — Assessment & Plan Note (Signed)
 BP adequately controlled. Continue amlodipine 5 mg daily, carvedilol 6.25 mg twice daily, and Irbesartan 150 mg daily. Low-salt diet is also recommended. Follow-up in 5 to 6 months.

## 2023-06-03 NOTE — Assessment & Plan Note (Signed)
 Resolved before hospital discharged in 04/2023. Encouraged hydration and fall prevention.

## 2023-06-03 NOTE — Assessment & Plan Note (Signed)
 Continue Rosuvastatin 20 mg daily and low fat diet. Last LDL 62 in 03/2023.

## 2023-06-10 ENCOUNTER — Other Ambulatory Visit: Payer: Self-pay | Admitting: Family Medicine

## 2023-06-10 NOTE — Telephone Encounter (Signed)
 Copied from CRM [id number]. Topic: Clinical - Medication Refill >> Jun 10, 2023  1:52 PM [name] wrote: Most Recent Primary Care Visit:  Provider: [place], [name]  Department: [facility]  Visit Type: OFFICE VISIT  Date: 06/01/2023  Medication: carvedilol (COREG) 6.25 MG tablet  Has the patient contacted their pharmacy? Yes (Agent: If no, request that the patient contact the pharmacy for the refill. If patient does not wish to contact the pharmacy document the reason why and proceed with request.) (Agent: If yes, when and what did the pharmacy advise?)  Is this the correct pharmacy for this prescription? Yes If no, delete pharmacy and type the correct one.  This is the patient's preferred pharmacy:  [facility] [id number] - [place], [place] - [address] [place] [address] Phone: [phone] Fax: [phone]    Has the prescription been filled recently? No  Is the patient out of the medication? No  Has the patient been seen for an appointment in the last year OR does the patient have an upcoming appointment? Yes  Can we respond through MyChart? Yes  Agent: Please be advised that Rx refills may take up to 3 business days. We ask that you follow-up with your pharmacy.

## 2023-06-11 MED ORDER — CARVEDILOL 6.25 MG PO TABS: 6.2500 mg | ORAL_TABLET | Freq: Two times a day (BID) | ORAL | 2 refills | Status: DC

## 2023-08-12 ENCOUNTER — Telehealth: Payer: Self-pay | Admitting: Family Medicine

## 2023-08-12 NOTE — Telephone Encounter (Signed)
 Handicap Placard form to be filled out--placed in dr's folder.  Call pt at [phone] upon completion for pickup.

## 2023-08-13 NOTE — Telephone Encounter (Signed)
 Completed and signed. BJ

## 2023-08-13 NOTE — Telephone Encounter (Signed)
 Contacted pt and inform him- form is ready to be pick up. Pt states he will pick it up on Monday. Form is at the front desk.

## 2023-08-18 DIAGNOSIS — Z961 Presence of intraocular lens: Secondary | ICD-10-CM | POA: Diagnosis not present

## 2023-08-18 DIAGNOSIS — H353123 Nonexudative age-related macular degeneration, left eye, advanced atrophic without subfoveal involvement: Secondary | ICD-10-CM | POA: Diagnosis not present

## 2023-08-18 DIAGNOSIS — H52203 Unspecified astigmatism, bilateral: Secondary | ICD-10-CM | POA: Diagnosis not present

## 2023-08-24 DIAGNOSIS — H35033 Hypertensive retinopathy, bilateral: Secondary | ICD-10-CM | POA: Diagnosis not present

## 2023-08-24 DIAGNOSIS — H353123 Nonexudative age-related macular degeneration, left eye, advanced atrophic without subfoveal involvement: Secondary | ICD-10-CM | POA: Diagnosis not present

## 2023-08-24 DIAGNOSIS — Z961 Presence of intraocular lens: Secondary | ICD-10-CM | POA: Diagnosis not present

## 2023-08-24 DIAGNOSIS — H43823 Vitreomacular adhesion, bilateral: Secondary | ICD-10-CM | POA: Diagnosis not present

## 2023-08-24 DIAGNOSIS — H353112 Nonexudative age-related macular degeneration, right eye, intermediate dry stage: Secondary | ICD-10-CM | POA: Diagnosis not present

## 2023-11-04 ENCOUNTER — Other Ambulatory Visit: Payer: Self-pay | Admitting: Family Medicine

## 2023-11-04 MED ORDER — CARVEDILOL 6.25 MG PO TABS: 6.2500 mg | ORAL_TABLET | Freq: Two times a day (BID) | ORAL | 2 refills | Status: AC

## 2023-11-04 NOTE — Telephone Encounter (Signed)
 Copied from CRM #[id number]. Topic: Clinical - Medication Refill >> Nov 04, 2023  8:32 AM [place] wrote: Medication: carvedilol  (COREG ) 6.25 MG tablet  Has the patient contacted their pharmacy? Yes (Agent: If no, request that the patient contact the pharmacy for the refill. If patient does not wish to contact the pharmacy document the reason why and proceed with request.) (Agent: If yes, when and what did the pharmacy advise?)  This is the patient's preferred pharmacy:  [facility] [id number] - [place], [place] - [address] [place] [address] Phone: [phone] Fax: [phone]   Is this the correct pharmacy for this prescription? Yes If no, delete pharmacy and type the correct one.   Has the prescription been filled recently? Yes  Is the patient out of the medication? Yes  Has the patient been seen for an appointment in the last year OR does the patient have an upcoming appointment? Yes  Can we respond through MyChart? Yes  Agent: Please be advised that Rx refills may take up to 3 business days. We ask that you follow-up with your pharmacy.

## 2024-02-02 ENCOUNTER — Ambulatory Visit: Payer: Self-pay

## 2024-02-02 NOTE — Telephone Encounter (Signed)
FYI. Pt going to ED.  

## 2024-02-02 NOTE — Telephone Encounter (Signed)
 FYI Only or Action Required?: FYI only for provider: ED advised.  Patient was last seen in primary care on 06/01/2023 by Jordan, [name], MD.  Called Nurse Triage reporting Chest Pain.  Symptoms began states about a year ago.  Interventions attempted: Other: regular home medications.  Symptoms are: unchanged.  Triage Disposition: Go to ED Now (Notify PCP)  Patient/caregiver understands and will follow disposition?: Yes   Reason for Disposition  [1] Chest pain (or angina) comes and goes AND [2] is happening more often (increasing in frequency) or getting worse (increasing in severity)  (Exception: Chest pains that last only a few seconds.)  Answer Assessment - Initial Assessment Questions Pt states that he has left shoulder, lung pain and sternum. Pt states that it has been intermittent for about a year. Pt states that he does have shortness of breath with this at times. Today pulse ox 93%. HR 92. Pain lasts sometimes for a few minutes and sometimes for an hour. RN did advise pt to go to the ER given his history and that the pain does still continue to come and go. RN advised pt to call back for follow up appt after Er visit.     1. LOCATION: Where does it hurt?       Left shoulder 2. RADIATION: Does the pain go anywhere else? (e.g., into neck, jaw, arms, back)     Radiates to left chest lung and sternum 3. ONSET: When did the chest pain begin? (Minutes, hours or days)      About a year ago 4. PATTERN: Does the pain come and go, or has it been constant since it started?  Does it get worse with exertion?      Comes and goes, not worse with exertion 5. DURATION: How long does it last (e.g., seconds, minutes, hours)     Could be 5 minutes could be an hour 6. SEVERITY: How bad is the pain?  (e.g., Scale 1-10; mild, moderate, or severe)     States behind shoulder is typically a 2, sternum can get more painful 7. CARDIAC RISK FACTORS: Do you have any history of heart  problems or risk factors for heart disease? (e.g., angina, prior heart attack; diabetes, high blood pressure, high cholesterol, smoker, or strong family history of heart disease)     CAD, stents 8. PULMONARY RISK FACTORS: Do you have any history of lung disease?  (e.g., blood clots in lung, asthma, emphysema, birth control pills)     PE. Lung cancer, copd 9. CAUSE: What do you think is causing the chest pain?     unknown 10. OTHER SYMPTOMS: Do you have any other symptoms? (e.g., dizziness, nausea, vomiting, sweating, fever, difficulty breathing, cough)       Shortness of breat  Protocols used: Chest Pain-A-AH Copied from CRM #[id number]. Topic: Clinical - Red Word Triage >> Feb 02, 2024 12:40 PM Lauren C wrote: Red Word that prompted transfer to Nurse Triage: Pt requesting call back to talk about weird pain around shoulder blade/rt lung/left side of chest and center below sternum. Short lived pain for 6 months to a year #[phone]

## 2024-03-03 NOTE — Progress Notes (Signed)
" ° °  03/03/2024  Patient ID: Timothy Wood, male   DOB: 1941/03/03, 83 y.o.   MRN: [id number]  Pharmacy Quality Measure Review  This patient is appearing on a report for being at risk of failing the adherence measure for cholesterol (statin) medications this calendar year.   Medication: Rosuvastatin  Last fill date: 02/27/24 for 90 day supply  Insurance report was not up to date. No action needed at this time.   Jon VEAR Lindau, PharmD Clinical Pharmacist [phone]   "

## 2024-03-20 ENCOUNTER — Ambulatory Visit: Payer: Self-pay

## 2024-03-20 NOTE — Telephone Encounter (Signed)
 FYI Only or Action Required?: FYI only for provider: ED advised.  Patient was last seen in primary care on 06/01/2023 by Jordan, [name], MD.  Called Nurse Triage reporting Shortness of Breath.  Symptoms began several months ago.  Interventions attempted: Nothing.  Symptoms are: gradually worsening.  Triage Disposition: Go to ED Now (Notify PCP)  Patient/caregiver understands and will follow disposition?: No, wishes to speak with PCP   Copied from CRM #[id number]. Topic: Clinical - Red Word Triage >> Mar 20, 2024  4:49 PM Winona R wrote: Losing weight, decrease in strength, hard time with mobility, Hard time breathing- shortness of breathe when active. Daughter is concerned. Reason for Disposition  History of prior blood clot in leg or lungs (i.e., deep vein thrombosis, pulmonary embolism)  Answer Assessment - Initial Assessment Questions Pt's daughter called to schedule wellness exam. He has lost 20 pounds in she doesn't know how long, he is weak, not eating well, states eating tires him out. She states he lives alone and refuses to go to nursing home or assisted living, lives in his recliner. She states he get extremely short of breath going from the living room to the bathroom, he has to stop to catch his breath. She states he was complaining of some chest discomfort but has been eating tums and denies pain now. He has told her he has checked his o2 and sitting he is 94-98%. Pt does have a history of blood clots. RN given his history and the shortness of breath, recommendations are for him to go to the ER. She states they are not doing that. They will sit there all day and night before he is even seen and that he has not real symptoms to be seen in the ER. She is just wanting a wellness exam with Dr. Jordan. Rn did give rationale for why recommendation is for the ER. She stated understanding but states she would like to start with Dr. Jordan. Rn stated understanding and advised would send the  message to the office as can't schedule wellness at this time. She stated understanding. Rosaline is requesting a call back to schedule [phone]    1. RESPIRATORY STATUS: Describe your breathing? (e.g., wheezing, shortness of breath, unable to speak, severe coughing)      Dtr states extreme shortness of breath with movement 2. ONSET: When did this breathing problem begin?      She states gradually worse over last 2-3 months 3. PATTERN Does the difficult breathing come and go, or has it been constant since it started?      Per dtr only with movement 4. SEVERITY: How bad is your breathing? (e.g., mild, moderate, severe)      She states extreme 5. RECURRENT SYMPTOM: Have you had difficulty breathing before? If Yes, ask: When was the last time? and What happened that time?       6. CARDIAC HISTORY: Do you have any history of heart disease? (e.g., heart attack, angina, bypass surgery, angioplasty)       7. LUNG HISTORY: Do you have any history of lung disease?  (e.g., pulmonary embolus, asthma, emphysema)     PE 8. CAUSE: What do you think is causing the breathing problem?       9. OTHER SYMPTOMS: Do you have any other symptoms? (e.g., chest pain, cough, dizziness, fever, runny nose)     Weakness and weight loss 10. O2 SATURATION MONITOR:  Do you use an oxygen saturation monitor (pulse oximeter) at home? If  Yes, ask: What is your reading (oxygen level) today? What is your usual oxygen saturation reading? (e.g., 95%)  Protocols used: Breathing Difficulty-A-AH

## 2024-03-21 NOTE — Telephone Encounter (Signed)
 Patient and the patients daughter has been made aware of Dr. Gib suggestions. Patient and patients daughter voiced understanding and stated they will give us  a call back with what they decide.

## 2024-03-21 NOTE — Telephone Encounter (Signed)
 Given his chronic co morbilities and the risk of a serious process, he really needs to be evaluated in the ER. Please inform daughter.  I could see him here in the office if he still refuse going to the ER. Thanks, BJ

## 2024-03-23 ENCOUNTER — Emergency Department (HOSPITAL_COMMUNITY)

## 2024-03-23 ENCOUNTER — Encounter (HOSPITAL_COMMUNITY): Payer: Self-pay | Admitting: Internal Medicine

## 2024-03-23 ENCOUNTER — Inpatient Hospital Stay (HOSPITAL_COMMUNITY)
Admission: EM | Admit: 2024-03-23 | Discharge: 2024-03-25 | DRG: 177 | Disposition: A | Attending: Internal Medicine | Admitting: Internal Medicine

## 2024-03-23 ENCOUNTER — Other Ambulatory Visit: Payer: Self-pay

## 2024-03-23 DIAGNOSIS — Z83719 Family history of colon polyps, unspecified: Secondary | ICD-10-CM

## 2024-03-23 DIAGNOSIS — Z955 Presence of coronary angioplasty implant and graft: Secondary | ICD-10-CM | POA: Diagnosis not present

## 2024-03-23 DIAGNOSIS — J440 Chronic obstructive pulmonary disease with acute lower respiratory infection: Secondary | ICD-10-CM | POA: Diagnosis present

## 2024-03-23 DIAGNOSIS — R627 Adult failure to thrive: Secondary | ICD-10-CM | POA: Diagnosis present

## 2024-03-23 DIAGNOSIS — Z801 Family history of malignant neoplasm of trachea, bronchus and lung: Secondary | ICD-10-CM

## 2024-03-23 DIAGNOSIS — Z85118 Personal history of other malignant neoplasm of bronchus and lung: Secondary | ICD-10-CM

## 2024-03-23 DIAGNOSIS — Z66 Do not resuscitate: Secondary | ICD-10-CM | POA: Diagnosis present

## 2024-03-23 DIAGNOSIS — J851 Abscess of lung with pneumonia: Secondary | ICD-10-CM | POA: Diagnosis present

## 2024-03-23 DIAGNOSIS — D638 Anemia in other chronic diseases classified elsewhere: Secondary | ICD-10-CM | POA: Diagnosis present

## 2024-03-23 DIAGNOSIS — I739 Peripheral vascular disease, unspecified: Secondary | ICD-10-CM | POA: Diagnosis present

## 2024-03-23 DIAGNOSIS — J90 Pleural effusion, not elsewhere classified: Secondary | ICD-10-CM | POA: Diagnosis present

## 2024-03-23 DIAGNOSIS — F32A Depression, unspecified: Secondary | ICD-10-CM | POA: Diagnosis present

## 2024-03-23 DIAGNOSIS — J852 Abscess of lung without pneumonia: Secondary | ICD-10-CM | POA: Diagnosis present

## 2024-03-23 DIAGNOSIS — Z860101 Personal history of adenomatous and serrated colon polyps: Secondary | ICD-10-CM

## 2024-03-23 DIAGNOSIS — F419 Anxiety disorder, unspecified: Secondary | ICD-10-CM | POA: Diagnosis present

## 2024-03-23 DIAGNOSIS — E7800 Pure hypercholesterolemia, unspecified: Secondary | ICD-10-CM | POA: Diagnosis present

## 2024-03-23 DIAGNOSIS — Z800 Family history of malignant neoplasm of digestive organs: Secondary | ICD-10-CM

## 2024-03-23 DIAGNOSIS — J189 Pneumonia, unspecified organism: Secondary | ICD-10-CM | POA: Diagnosis present

## 2024-03-23 DIAGNOSIS — Z720 Tobacco use: Secondary | ICD-10-CM | POA: Diagnosis not present

## 2024-03-23 DIAGNOSIS — J9601 Acute respiratory failure with hypoxia: Secondary | ICD-10-CM | POA: Diagnosis present

## 2024-03-23 DIAGNOSIS — E876 Hypokalemia: Secondary | ICD-10-CM | POA: Diagnosis present

## 2024-03-23 DIAGNOSIS — C3491 Malignant neoplasm of unspecified part of right bronchus or lung: Secondary | ICD-10-CM | POA: Diagnosis present

## 2024-03-23 DIAGNOSIS — F17210 Nicotine dependence, cigarettes, uncomplicated: Secondary | ICD-10-CM | POA: Diagnosis present

## 2024-03-23 DIAGNOSIS — I10 Essential (primary) hypertension: Secondary | ICD-10-CM | POA: Diagnosis present

## 2024-03-23 DIAGNOSIS — Z8049 Family history of malignant neoplasm of other genital organs: Secondary | ICD-10-CM

## 2024-03-23 DIAGNOSIS — K219 Gastro-esophageal reflux disease without esophagitis: Secondary | ICD-10-CM | POA: Diagnosis present

## 2024-03-23 DIAGNOSIS — Z86711 Personal history of pulmonary embolism: Secondary | ICD-10-CM | POA: Diagnosis not present

## 2024-03-23 DIAGNOSIS — J188 Other pneumonia, unspecified organism: Secondary | ICD-10-CM

## 2024-03-23 DIAGNOSIS — Z8249 Family history of ischemic heart disease and other diseases of the circulatory system: Secondary | ICD-10-CM

## 2024-03-23 DIAGNOSIS — Z86718 Personal history of other venous thrombosis and embolism: Secondary | ICD-10-CM

## 2024-03-23 DIAGNOSIS — Z833 Family history of diabetes mellitus: Secondary | ICD-10-CM

## 2024-03-23 DIAGNOSIS — Z7901 Long term (current) use of anticoagulants: Secondary | ICD-10-CM | POA: Diagnosis not present

## 2024-03-23 DIAGNOSIS — Z9841 Cataract extraction status, right eye: Secondary | ICD-10-CM

## 2024-03-23 DIAGNOSIS — Z79899 Other long term (current) drug therapy: Secondary | ICD-10-CM

## 2024-03-23 DIAGNOSIS — I2510 Atherosclerotic heart disease of native coronary artery without angina pectoris: Secondary | ICD-10-CM | POA: Diagnosis present

## 2024-03-23 DIAGNOSIS — J984 Other disorders of lung: Secondary | ICD-10-CM | POA: Diagnosis not present

## 2024-03-23 DIAGNOSIS — Z885 Allergy status to narcotic agent status: Secondary | ICD-10-CM | POA: Diagnosis not present

## 2024-03-23 DIAGNOSIS — Z6820 Body mass index (BMI) 20.0-20.9, adult: Secondary | ICD-10-CM | POA: Diagnosis not present

## 2024-03-23 DIAGNOSIS — Z716 Tobacco abuse counseling: Secondary | ICD-10-CM

## 2024-03-23 DIAGNOSIS — J449 Chronic obstructive pulmonary disease, unspecified: Secondary | ICD-10-CM | POA: Diagnosis present

## 2024-03-23 DIAGNOSIS — E782 Mixed hyperlipidemia: Secondary | ICD-10-CM | POA: Diagnosis present

## 2024-03-23 DIAGNOSIS — Z8261 Family history of arthritis: Secondary | ICD-10-CM

## 2024-03-23 DIAGNOSIS — Z9842 Cataract extraction status, left eye: Secondary | ICD-10-CM

## 2024-03-23 DIAGNOSIS — R300 Dysuria: Secondary | ICD-10-CM | POA: Diagnosis present

## 2024-03-23 LAB — COMPREHENSIVE METABOLIC PANEL WITH GFR
ALT: 11 U/L (ref 0–44)
AST: 14 U/L — ABNORMAL LOW (ref 15–41)
Albumin: 3 g/dL — ABNORMAL LOW (ref 3.5–5.0)
Alkaline Phosphatase: 121 U/L (ref 38–126)
Anion gap: 10 (ref 5–15)
BUN: 13 mg/dL (ref 8–23)
CO2: 29 mmol/L (ref 22–32)
Calcium: 8 mg/dL — ABNORMAL LOW (ref 8.9–10.3)
Chloride: 99 mmol/L (ref 98–111)
Creatinine, Ser: 0.91 mg/dL (ref 0.61–1.24)
GFR, Estimated: >60 mL/min
Glucose, Bld: 142 mg/dL — ABNORMAL HIGH (ref 70–99)
Potassium: 3.3 mmol/L — ABNORMAL LOW (ref 3.5–5.1)
Sodium: 139 mmol/L (ref 135–145)
Total Bilirubin: 0.4 mg/dL (ref 0.0–1.2)
Total Protein: 6.5 g/dL (ref 6.5–8.1)

## 2024-03-23 LAB — URINALYSIS, ROUTINE W REFLEX MICROSCOPIC
Bilirubin Urine: NEGATIVE
Glucose, UA: NEGATIVE mg/dL
Hgb urine dipstick: NEGATIVE
Ketones, ur: NEGATIVE mg/dL
Leukocytes,Ua: NEGATIVE
Nitrite: NEGATIVE
Protein, ur: NEGATIVE mg/dL
Specific Gravity, Urine: >1.046 — ABNORMAL HIGH (ref 1.005–1.030)
pH: 5 (ref 5.0–8.0)

## 2024-03-23 LAB — CBC WITH DIFFERENTIAL/PLATELET
Abs Immature Granulocytes: 0.1 K/uL — ABNORMAL HIGH (ref 0.00–0.07)
Basophils Absolute: 0.1 K/uL (ref 0.0–0.1)
Basophils Relative: 0 %
Eosinophils Absolute: 0.1 K/uL (ref 0.0–0.5)
Eosinophils Relative: 1 %
HCT: 37.5 % — ABNORMAL LOW (ref 39.0–52.0)
Hemoglobin: 11.5 g/dL — ABNORMAL LOW (ref 13.0–17.0)
Immature Granulocytes: 1 %
Lymphocytes Relative: 7 %
Lymphs Abs: 1.1 K/uL (ref 0.7–4.0)
MCH: 27.3 pg (ref 26.0–34.0)
MCHC: 30.7 g/dL (ref 30.0–36.0)
MCV: 89.1 fL (ref 80.0–100.0)
Monocytes Absolute: 1.5 K/uL — ABNORMAL HIGH (ref 0.1–1.0)
Monocytes Relative: 9 %
Neutro Abs: 13.6 K/uL — ABNORMAL HIGH (ref 1.7–7.7)
Neutrophils Relative %: 82 %
Platelets: 441 K/uL — ABNORMAL HIGH (ref 150–400)
RBC: 4.21 MIL/uL — ABNORMAL LOW (ref 4.22–5.81)
RDW: 15.1 % (ref 11.5–15.5)
WBC: 16.4 K/uL — ABNORMAL HIGH (ref 4.0–10.5)
nRBC: 0 % (ref 0.0–0.2)

## 2024-03-23 LAB — LIPASE, BLOOD: Lipase: 39 U/L (ref 11–51)

## 2024-03-23 LAB — STREP PNEUMONIAE URINARY ANTIGEN: Strep Pneumo Urinary Antigen: NEGATIVE

## 2024-03-23 MED ORDER — PIPERACILLIN-TAZOBACTAM 3.375 G IVPB
3.3750 g | Freq: Three times a day (TID) | INTRAVENOUS | Status: DC
  Administered 2024-03-23 – 2024-03-25 (×6): 3.375 g via INTRAVENOUS
  Filled 2024-03-23 (×6): qty 50

## 2024-03-23 MED ORDER — MAGNESIUM OXIDE 400 MG PO TABS
400.0000 mg | ORAL_TABLET | Freq: Every day | ORAL | Status: DC
  Filled 2024-03-23: qty 1

## 2024-03-23 MED ORDER — CARVEDILOL 6.25 MG PO TABS
6.2500 mg | ORAL_TABLET | Freq: Two times a day (BID) | ORAL | Status: DC
  Administered 2024-03-23 – 2024-03-25 (×3): 6.25 mg via ORAL
  Filled 2024-03-23 (×3): qty 1

## 2024-03-23 MED ORDER — PIPERACILLIN-TAZOBACTAM 3.375 G IVPB 30 MIN
3.3750 g | Freq: Once | INTRAVENOUS | Status: AC
  Administered 2024-03-23: 3.375 g via INTRAVENOUS
  Filled 2024-03-23: qty 50

## 2024-03-23 MED ORDER — ONDANSETRON HCL 4 MG/2ML IJ SOLN: 4.0000 mg | Freq: Four times a day (QID) | INTRAMUSCULAR | Status: DC | PRN

## 2024-03-23 MED ORDER — PANTOPRAZOLE SODIUM 40 MG PO TBEC
40.0000 mg | DELAYED_RELEASE_TABLET | Freq: Every day | ORAL | Status: DC
  Administered 2024-03-24 – 2024-03-25 (×2): 40 mg via ORAL
  Filled 2024-03-23 (×2): qty 1

## 2024-03-23 MED ORDER — ROSUVASTATIN CALCIUM 20 MG PO TABS
20.0000 mg | ORAL_TABLET | Freq: Every day | ORAL | Status: DC
  Administered 2024-03-24 – 2024-03-25 (×2): 20 mg via ORAL
  Filled 2024-03-23 (×2): qty 1

## 2024-03-23 MED ORDER — ACETAMINOPHEN 325 MG PO TABS: 650.0000 mg | ORAL_TABLET | Freq: Four times a day (QID) | ORAL | Status: DC | PRN

## 2024-03-23 MED ORDER — FOLIC ACID 1 MG PO TABS
1.0000 mg | ORAL_TABLET | Freq: Every day | ORAL | Status: DC
  Administered 2024-03-24 – 2024-03-25 (×2): 1 mg via ORAL
  Filled 2024-03-23 (×2): qty 1

## 2024-03-23 MED ORDER — IPRATROPIUM-ALBUTEROL 0.5-2.5 (3) MG/3ML IN SOLN: 3.0000 mL | RESPIRATORY_TRACT | Status: DC | PRN

## 2024-03-23 MED ORDER — SODIUM CHLORIDE 0.9 % IV SOLN
1.0000 g | Freq: Once | INTRAVENOUS | Status: DC
  Filled 2024-03-23: qty 10

## 2024-03-23 MED ORDER — ACETAMINOPHEN 650 MG RE SUPP: 650.0000 mg | Freq: Four times a day (QID) | RECTAL | Status: DC | PRN

## 2024-03-23 MED ORDER — ONDANSETRON HCL 4 MG PO TABS: 4.0000 mg | ORAL_TABLET | Freq: Four times a day (QID) | ORAL | Status: DC | PRN

## 2024-03-23 MED ORDER — SODIUM CHLORIDE 0.9 % IV BOLUS
1000.0000 mL | Freq: Once | INTRAVENOUS | Status: AC
  Administered 2024-03-23: 1000 mL via INTRAVENOUS

## 2024-03-23 MED ORDER — SODIUM CHLORIDE 0.9 % IV SOLN
500.0000 mg | Freq: Once | INTRAVENOUS | Status: DC
  Filled 2024-03-23: qty 5

## 2024-03-23 MED ORDER — DULOXETINE HCL 60 MG PO CPEP
60.0000 mg | ORAL_CAPSULE | Freq: Every day | ORAL | Status: DC
  Administered 2024-03-24 – 2024-03-25 (×2): 60 mg via ORAL
  Filled 2024-03-23 (×2): qty 1

## 2024-03-23 MED ORDER — GUAIFENESIN ER 600 MG PO TB12
600.0000 mg | ORAL_TABLET | Freq: Two times a day (BID) | ORAL | Status: DC
  Administered 2024-03-23 – 2024-03-25 (×5): 600 mg via ORAL
  Filled 2024-03-23 (×5): qty 1

## 2024-03-23 MED ORDER — IOHEXOL 300 MG/ML  SOLN
100.0000 mL | Freq: Once | INTRAMUSCULAR | Status: AC | PRN
  Administered 2024-03-23: 100 mL via INTRAVENOUS

## 2024-03-23 MED ORDER — ALBUTEROL SULFATE (2.5 MG/3ML) 0.083% IN NEBU: 2.5000 mg | INHALATION_SOLUTION | RESPIRATORY_TRACT | Status: DC | PRN

## 2024-03-23 MED ORDER — THIAMINE HCL 100 MG PO TABS
100.0000 mg | ORAL_TABLET | Freq: Every day | ORAL | Status: DC
  Filled 2024-03-23: qty 1

## 2024-03-23 MED ORDER — BUDESON-GLYCOPYRROL-FORMOTEROL 160-9-4.8 MCG/ACT IN AERO
2.0000 | INHALATION_SPRAY | Freq: Two times a day (BID) | RESPIRATORY_TRACT | Status: DC
  Administered 2024-03-23 – 2024-03-25 (×4): 2 via RESPIRATORY_TRACT
  Filled 2024-03-23: qty 5.9

## 2024-03-23 MED ORDER — ENOXAPARIN SODIUM 60 MG/0.6ML IJ SOSY: 60.0000 mg | PREFILLED_SYRINGE | Freq: Two times a day (BID) | INTRAMUSCULAR | Status: DC

## 2024-03-23 NOTE — H&P (Signed)
 " History and Physical  Timothy Wood FMW:[id number] DOB: [birth year] DOA: 03/23/2024  PCP: Jordan, [name], MD   Patient coming from: Home Chief Complaint  Patient presents with   Dysuria     HPI: Timothy Wood is a 84 y.o. male who lives at home by himself, using walker at baseline, and daughter visiting him regularly and with PMH of right lung cancer >5 years ago, DVT PE on Eliquis , HTN, PVD/HLD, COPD,GERD presents to the ED with multiple complaints including several weeks of difficulty with urination with [facility] male increasing frequency, lower abdominal discomfort and also with shortness of breath decreasing appetite. He lost about 20 lb is last 3 weeks. She has productive cough, intermittent chest pain on left Patient otherwise denies any nausea, vomiting, chest pain, fever, chills, headache, focal weakness, numbness tingling, speech difficulties   In the ED: Vitals stable afebrile not hypoxic.  Labs with leukocytosis 10.4, mild hypokalemia anemia UA. CXR: Moderate focal consolidation left upper lobe CT chest abd pelvis w: multifocal pneumonia in the left upper and left lower lobes. Large thick-walled fluid and gas collection in the posterior left upper lobe, measuring 7.2 x 8.1 x 9.6 cm, consistent with a large lung abscess. Of note, the posterior left apical segmental pulmonary artery courses through the medial aspect of the lung abscess.Small left pleural effusion. Right upper lobe tree-in-bud nodules are present, consistent with endobronchial spread of the infection.  Assessment and plan:  Abscess of left lung with pneumonia Left lung consolidation: Admit the patient, start Zosyn . Add bronchodilators, speech eval, pulmonary has been consulted for further eval. He has lost abt 20 lb in 3 wk, stil lsmoking- previous lung cancer  FTT: RD eval  History of lung cancer right: Followed by Dr. Sherrod. States when biopsy was done  they got all out  History of pulmonary  embolus/DVT x 4: Patient on Eliquis . Switch to Lovenox  incase he needs procedures  Essential hypertension: Remains well-controlled.  Holding home amlodipine  Coreg  and resume slowly  Hyperlipemia Peripheral vascular disease: Continue anticoagulation as above, statin  COPD GOLD I: continue bronchodilators.    DVT prophylaxis: lovenox  SCDs Start: 03/23/24 1355Code Status:   Code Status: Limited: Do not attempt resuscitation (DNR) -DNR-LIMITED -Do Not Intubate/DNI  he wishes not to have CPR and intubation- DNR Family Communication: plan of care discussed with patient at bedside. Patient status is: Remains hospitalized because of severity of illness Level of care: Progressive   Dispo: The patient is from: home alone            Anticipated disposition: TBD Objective: Vitals last 24 hrs: Vitals:   03/23/24 1010 03/23/24 1013  BP: 125/67   Pulse: 71   Resp: 20   Temp: 97.6 F (36.4 C)   TempSrc: Axillary   SpO2: 95% 95%    Physical Examination: General exam: alert awake, oriented, older than stated age HEENT:Oral mucosa moist, Ear/Nose WNL grossly Respiratory system: Bilaterally clear BS,no use of accessory muscle Cardiovascular system: S1 & S2 +, No JVD. Gastrointestinal system: Abdomen soft,NT,ND, BS+ Nervous System: Alert, awake, moving all extremities,and following commands. Extremities: extremities warm, leg edema neg Skin: Warm, no rashes MSK: Normal muscle bulk,tone, power   Medications reviewed:  Scheduled Meds:  budesonide -glycopyrrolate -formoterol   2 puff Inhalation BID   carvedilol   6.25 mg Oral BID WC   DULoxetine   60 mg Oral Daily   folic acid   1 mg Oral Daily   guaiFENesin   600 mg Oral BID   magnesium  oxide  400 mg Oral Daily   pantoprazole   40 mg Oral Daily   rosuvastatin   20 mg Oral Daily   thiamine   100 mg Oral Daily   Continuous Infusions:  piperacillin -tazobactam (ZOSYN )  IV     Diet: Diet Order             DIET SOFT Room service appropriate?  Yes; Fluid consistency: Thin  Diet effective now                    Review of Systems: All systems were reviewed and were negative except as mentioned in HPI above. Negative for fever Negative for vomiting Negative for hemoptysis  Past Medical History:  Diagnosis Date   Adenocarcinoma of lung, stage 1 (HCC)    Anemia    Angiodysplasia of cecum 11/23/2017   Multiple 1-2 mm   Arthritis    back possibly   Blood clotting disorder    Cataract    removed bilaterally    Chronic obstructive pulmonary disease (COPD) (HCC)    patient not aware   Clotting disorder    PE LUNG, dvt leg    Colon polyps    Depression    Diverticulosis    DVT (deep venous thrombosis) (HCC)    Post op  knee surgery   DVT (deep venous thrombosis) (HCC)    GERD (gastroesophageal reflux disease)    Histoplasmosis    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 diverticulosis also. 11/24/2010: 12 polyps removed - adenomas Anticipate routine repeat colonoscopy Spring 2013 Have recommended he see genetics counselor re ? Polyposis 03/27/2016 14 polyps removed max 15 mm    Pulmonary nodule 06/2006   9 mm right upper obe pulmonary nodule (negative Pet Scan)   PVD (peripheral vascular disease)    left leg s/p stent   Sebaceous cyst    neck   Seborrheic keratosis    Seizures (HCC)    4-5 yrs ago had seizures when in ICU- none since this time    Sinusitis    Tobacco abuse    Tremor     Past Surgical History:  Procedure Laterality Date   ANGIOPLASTY / STENTING ILIAC  08/2008   Dr. Serene SANES SURGERY  1975, 1978   x2 lumbar fusion over 30 years ago   BACK SURGERY     COLONOSCOPY  05/11/2006; 07/27/2006;05/05/2007, 11/24/2010   adenomas with 9 polyps index and 2.5 cm cecal polyp removed over 3 exams, last 2009 with APC. diverticulosis also.  2012: 12 adenomas removed, largest 8-10 mm, diverticulosis   CORONARY ANGIOPLASTY WITH STENT PLACEMENT     DESCENDING AORTIC ANEURYSM REPAIR W/ STENT  04/2010   Dr. Serene   ESOPHAGOGASTRODUODENOSCOPY  05/05/2007   GERD - one erosion   EYE SURGERY Bilateral    cataract   KNEE SURGERY  2002   left- cartilage    LUNG BIOPSY     PERCUTANEOUS PINNING Left 06/24/2012   Procedure: Repair Complex Laceration Left Thumb with percutaneous pinning.;  Surgeon: Balinda JAYSON Rogue, MD;  Location: [facility] OR;  Service: Plastics;  Laterality: Left;   THUMB AMPUTATION Left    pinning and index finger   TONSILLECTOMY  1956   TONSILLECTOMY     UPPER GASTROINTESTINAL ENDOSCOPY     VIDEO ASSISTED THORACOSCOPY (VATS)/WEDGE RESECTION Right 01/24/2015   Procedure: RIGHT VIDEO ASSISTED THORACOSCOPY (VATS)/WEDGE RESECTION;  Surgeon: Elspeth JAYSON Millers, MD;  Location: [facility] OR;  Service: Thoracic;  Laterality: Right;   vocal cord polyps removed       reports that he has been smoking cigarettes. He has a 60 pack-year smoking history. He has never used smokeless tobacco. He reports current alcohol use. He reports that he does not use drugs.  Allergies[1]  Family History  Problem Relation Age of Onset   Colon cancer Mother 73       multiple colon polyps   Deep vein thrombosis Mother    Cancer Mother 45       uterine cancer   Uterine cancer Mother    Lung cancer Mother    Lung cancer Son        smoked and was a welder   Heart disease Brother    Diabetes Other    Heart disease Brother    Rheum arthritis Daughter    Rheum arthritis Sister    Colon polyps Neg Hx    Stomach cancer Neg Hx    Rectal cancer Neg Hx      Prior to Admission medications   Medication Sig Start Date End Date Taking? Authorizing Provider  Acetaminophen  500 MG capsule Take 1,000 mg by mouth daily as needed for fever.    [provider]  amLODipine  (NORVASC ) 5 MG tablet Take 1 tablet (5 mg total) by mouth daily. 06/01/23   Jordan, [name], MD  apixaban  (ELIQUIS ) 5 MG TABS tablet Take 1 tablet (5 mg total) by mouth 2 (two) times daily. 06/01/23   Jordan, [name], MD  carvedilol  (COREG ) 6.25 MG tablet Take 1 tablet (6.25 mg total) by mouth 2 (two) times daily with a meal. 11/04/23   Jordan, [name], MD  DULoxetine  (CYMBALTA ) 60 MG capsule Take 1 capsule (60 mg total) by mouth daily. 04/14/23   Jordan, [name], MD  EPINEPHrine  (EPIPEN  2-PAK) 0.3 mg/0.3 mL IJ SOAJ injection To use daily if needed for severe allergy reaction. 08/20/21   Jordan, [name], MD  folic acid  (FOLVITE ) 1 MG tablet TAKE 1 TABLET BY MOUTH DAILY 05/24/23   Jordan, [name], MD  irbesartan  (AVAPRO ) 150 MG tablet Take 1 tablet (150 mg total) by mouth daily. 06/01/23   Jordan, [name], MD  magnesium  oxide (MAG-OX) 400 MG tablet Take 1 tablet (400 mg total) by mouth daily. 08/31/22   Jordan, [name], MD  omeprazole  (PRILOSEC) 20 MG capsule TAKE 1 CAPSULE BY MOUTH TWICE A DAY BEFORE MEALS 01/18/23   Jordan, [name], MD  potassium chloride  (KLOR-CON ) 10 MEQ tablet Take 1 tablet (10 mEq total) by mouth daily as needed (with lasix ). 06/01/23 09/29/23  Jordan, [name], MD  rosuvastatin  (CRESTOR ) 20 MG tablet Take 1 tablet (20 mg total) by mouth daily. 06/01/23   Jordan, [name], MD  thiamine  (VITAMIN B1) 100 MG tablet Take 1 tablet (100 mg total) by mouth daily. 06/01/23   Jordan, [name], MD  Labs on Admission: I have personally reviewed following labs and imaging studies CBC: Recent Labs  Lab 03/23/24 1044  WBC 16.4*  NEUTROABS 13.6*  HGB  11.5*  HCT 37.5*  MCV 89.1  PLT 441*   Basic Metabolic Panel: Recent Labs  Lab 03/23/24 1044  NA 139  K 3.3*  CL 99  CO2 29  GLUCOSE 142*  BUN 13  CREATININE  0.91  CALCIUM  8.0*   CrCl cannot be calculated (Unknown ideal weight.). Recent Labs  Lab 03/23/24 1044  AST 14*  ALT 11  ALKPHOS 121  BILITOT 0.4  PROT 6.5  ALBUMIN 3.0*   Recent Labs  Lab 03/23/24 1044  LIPASE 39  No results for input(s): CHOL, HDL, LDLCALC, TRIG, CHOLHDL, LDLDIRECT in the last 72 hours. Thyroid  Function Tests: No results for input(s): TSH, T4TOTAL, FREET4, T3FREE, THYROIDAB in the last 72 hours. Urine analysis:    Component Value Date/Time   COLORURINE YELLOW 03/23/2024 1231   APPEARANCEUR CLEAR 03/23/2024 1231   LABSPEC >1.046 (H) 03/23/2024 1231   PHURINE 5.0 03/23/2024 1231   GLUCOSEU NEGATIVE 03/23/2024 1231   GLUCOSEU NEGATIVE 04/14/2023 1329   HGBUR NEGATIVE 03/23/2024 1231   BILIRUBINUR NEGATIVE 03/23/2024 1231   BILIRUBINUR n 07/25/2012 1037   KETONESUR NEGATIVE 03/23/2024 1231   PROTEINUR NEGATIVE 03/23/2024 1231   UROBILINOGEN 0.2 04/14/2023 1329   NITRITE NEGATIVE 03/23/2024 1231   LEUKOCYTESUR NEGATIVE 03/23/2024 1231   Radiological Exams on Admission: CT CHEST ABDOMEN PELVIS W CONTRAST Result Date: 03/23/2024 EXAM: CT CHEST, ABDOMEN AND PELVIS WITH CONTRAST 03/23/2024 11:47:03 AM TECHNIQUE: CT of the chest, abdomen and pelvis was performed with the administration of 100 mL of iohexol  (OMNIPAQUE ) 300 MG/ML solution. Multiplanar reformatted images are provided for review. Automated exposure control, iterative reconstruction, and/or weight based adjustment of the mA/kV was utilized to reduce the radiation dose to as low as reasonably achievable. COMPARISON: 04/22/2023, 06/03/2022, 08/06/2015 CLINICAL HISTORY: Painful and odorous urination, intermittent flank pain. FINDINGS: CHEST: MEDIASTINUM AND LYMPH NODES: Heart and pericardium are unremarkable. The central airways are clear. Mildly enlarged multistation mediastinal lymph nodes. For example, a 1.1 cm AP window lymph node (axial 27), an enlarged subcarinal lymph node  measuring 1.5 cm (axial 34), and an enlarged left suprahilar lymph node measuring 1 cm (axial 32). LUNGS AND PLEURA: Centrilobular emphysema. Postsurgical changes of a wedge resection along the right middle lobe laterally. Surgical anastomosis posteriorly in the right upper lobe consistent with resection. Mild diffuse bronchial wall thickening. Hazy ground glass airspace opacities dependently within the left upper lobe and the left lower lobe. There is a large thick-walled fluid and gas collection centered in the posterior left upper lobe measuring 7.2 x 8.1 x 9.6 cm. Small left pleural effusion. A few clustered tree-in-bud nodules are present in the right upper lobe, worrisome for endobronchial spread of infection. No pneumothorax. No pulmonary embolism. The posterior left apical segmental pulmonary artery courses through the medial aspect of the thick-walled left upper lobe fluid collection. ABDOMEN AND PELVIS: LIVER: Similar appearance of multiple left hepatic lobe cysts. GALLBLADDER AND BILE DUCTS: Distended gallbladder without wall thickening or radiopaque stones. No biliary ductal dilatation. SPLEEN: No acute abnormality. PANCREAS: No acute abnormality. ADRENAL GLANDS: Small left adrenal nodule measuring 7 mm, which in retrospect, was likely present previously, but much smaller suggesting a benign lesion such as an adenoma. KIDNEYS, URETERS AND BLADDER: Multiple bilateral renal cysts. No stones in the kidneys or ureters. No hydronephrosis. No perinephric or periureteral stranding. The urinary bladder is distended without focal abnormality. GI AND BOWEL: Stomach demonstrates no acute abnormality. Decompressed normal appendix. There is no bowel obstruction. REPRODUCTIVE ORGANS: Mild prostatomegaly. Findings of BPH  also present. PERITONEUM AND RETROPERITONEUM: No ascites. No free air. VASCULATURE: Diffuse calcified aortic atherosclerosis. No aneurysm or aortic dissection. Severe diffuse aortoiliac  atherosclerosis. Infrarenal aortic stent appears patent. Left common iliac stent also appears patent. ABDOMINAL AND PELVIS LYMPH NODES: No lymphadenopathy. BONES AND SOFT TISSUES: Osteopenia. Multilevel degenerative disc disease throughout the spine. Chronic appearing compression fractures within the lumbar spine. Unchanged lesion in the right iliac bone, likely benign. No focal soft tissue abnormality. IMPRESSION: 1. Findings consistent with multifocal pneumonia in the left upper and left lower lobes. Large thick-walled fluid and gas collection in the posterior left upper lobe, measuring 7.2 x 8.1 x 9.6 cm, consistent with a large lung abscess. Of note, the posterior left apical segmental pulmonary artery courses through the medial aspect of the lung abscess. 2. Small left pleural effusion. Right upper lobe tree-in-bud nodules are present, consistent with endobronchial spread of the infection. 3. Mildly enlarged multistation mediastinal lymph nodes, likely reactive. 4. No pulmonary embolism. 5. No acute abnormality in the abdomen or pelvis. 6. Mild prostatomegaly with findings of BPH. Prominent Electronically signed by: Rogelia Myers MD MD 03/23/2024 12:13 PM EST RP Workstation: [id number]   DG Chest 2 View Result Date: 03/23/2024 CLINICAL DATA:  Cough and shortness of breath. EXAM: CHEST - 2 VIEW COMPARISON:  04/22/2023 FINDINGS: Lungs are adequately inflated demonstrate moderate focal consolidation over the left upper lobe likely pneumonia. No evidence of effusion. Postsurgical change over the right lung. Cardiomediastinal silhouette and remainder of the exam is unchanged. IMPRESSION: Moderate focal consolidation over the left upper lobe likely pneumonia. Recommend follow-up chest radiograph 4-6 weeks to document resolution. Electronically Signed   By: Toribio Agreste M.D.   On: 03/23/2024 11:07   Mennie LAMY MD [facility]  If 7PM-7AM, please contact night-coverage [email or web address]  03/23/2024, 2:01 PM       [1]  Allergies Allergen Reactions   Codeine Anaphylaxis   Hydrocodone  Itching   "

## 2024-03-23 NOTE — Hospital Course (Addendum)
 Timothy Wood is a 84 y.o. male who lives at home by himself, using walker at baseline, and daughter visiting him regularly and with PMH of right lung cancer >5 years ago, DVT PE on Eliquis , HTN, PVD/HLD, COPD,GERD presents to the ED with multiple complaints including several weeks of difficulty with urination with [facility] male increasing frequency, lower abdominal discomfort and also with shortness of breath decreasing appetite. He lost about 20 lb is last 3 weeks. She has productive cough, intermittent chest pain on left Patient otherwise denies any nausea, vomiting, chest pain, fever, chills, headache, focal weakness, numbness tingling, speech difficulties   In the ED: Vitals stable afebrile not hypoxic.  Labs with leukocytosis 10.4, mild hypokalemia anemia UA. CXR: Moderate focal consolidation left upper lobe CT chest abd pelvis w: multifocal pneumonia in the left upper and left lower lobes. Large thick-walled fluid and gas collection in the posterior left upper lobe, measuring 7.2 x 8.1 x 9.6 cm, consistent with a large lung abscess. Of note, the posterior left apical segmental pulmonary artery courses through the medial aspect of the lung abscess.Small left pleural effusion. Right upper lobe tree-in-bud nodules are present, consistent with endobronchial spread of the infection. S/p bronch 1/9 -airway normal mucopurulent secretion left upper lobe, transbronchial brushing obtained and BAL performed - follow-up. Per pccm plan for 4 wk of augmentin  and OP follow up  Subjective: Seen and examined Patient reports he is doing well.  No nausea vomiting chest pain or shortness of breath Overnight afebrile BP stable on nasal cannula.Labs with mild hypokalemia, worsening leukocytosis 16 >15>20k.  S/p Bronch 1/9  Discharge Diagnoses:   Left lung abscess Multifocal pneumonia in left upper lobe/lower lobes History of COPD History of stage Ia NSCLC /ACC right lung- 2016 Ongoing tobacco abuse Acute  hypoxic respiratory failure: Appreciate pulmonary input. S/p bronch 1/9 -airway normal mucopurulent secretion left upper lobe, transbronchial brushing obtained and BAL performed - follow-up  BAL culture sensitivities Leukocytosis worsening postprocedure, but has been afebrile Continue Zosyn , bronchodilator-breztri , prn nebs, pulmonary toileting Encourage smoking cessation. Requesting nicotine  patch He is followed by Dr. Sherrod and reprots after biopsy  they got all cancer out On [place] -wean o2.  Hypokalemia: Replaced  Mild anemia: Suspect from chronic illness.  Trend  FTT 20 pound weight loss in 2-3 weeks: RD eval. Augment diet  History of pulmonary embolus/DVT x 4: PTA on Eliquis .  Resume anticoagulations post bronchoscopy  Essential hypertension: well-controlled.   Hyperlipemia Peripheral vascular disease: Continue statin  DVT prophylaxis: lovenox  SCDs Start: 03/23/24 1355Code Status:   Code Status: Limited: Do not attempt resuscitation (DNR) -DNR-LIMITED -Do Not Intubate/DNI  he wishes not to have CPR and intubation and is DNR Family Communication: plan of care discussed with patient at bedside. Patient status is: Remains hospitalized because of severity of illness Level of care: Telemetry   Dispo: The patient is from: home alone, daughter lives close by            Anticipated disposition: home Objective: Vitals last 24 hrs: Vitals:   03/24/24 2120 03/24/24 2126 03/25/24 0550 03/25/24 0802  BP:  119/71 123/73   Pulse:  87 74   Resp:  20 20   Temp:  97.6 F (36.4 C) (!) 97.5 F (36.4 C)   TempSrc:  Oral Oral   SpO2: 95% 93% 98% 94%  Weight:      Height:       Physical Examination: General exam: AAOX3 NAD HEENT:Oral mucosa moist, Ear/Nose WNL grossly Respiratory system:  Bilaterally clear breath sounds,no use of accessory muscle Cardiovascular system: S1 & S2 +, No JVD. Gastrointestinal system: Abdomen soft,NT,ND, BS+ Nervous System: Alert, awake,  nonfocal Extremities: extremities warm, leg edema neg Skin: Warm, no rashes MSK: Normal muscle bulk,tone, power

## 2024-03-23 NOTE — Progress Notes (Addendum)
 PHARMACY - ANTICOAGULATION CONSULT NOTE  Pharmacy Consult for Lovenox  Indication: history of PE/DVT  Allergies[1]  Patient Measurements: Height: 5' 8 (172.7 cm) Weight: 62 kg (136 lb 11 oz) IBW/kg (Calculated) : 68.4 HEPARIN  DW (KG): 62  Vital Signs: Temp: 96.7 F (35.9 C) (01/08 1434) Temp Source: Axillary (01/08 1434) BP: 121/72 (01/08 1413) Pulse Rate: 75 (01/08 1413)  Labs: Recent Labs    03/23/24 1044  HGB 11.5*  HCT 37.5*  PLT 441*  CREATININE 0.91    Estimated Creatinine Clearance: 53.9 mL/min (by C-G formula based on SCr of 0.91 mg/dL).   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 diverticulosis also. 11/24/2010: 12 polyps removed - adenomas Anticipate routine repeat colonoscopy Spring 2013 Have recommended he see genetics counselor re ? Polyposis 03/27/2016 14 polyps removed max 15 mm    Pulmonary nodule 06/2006   9 mm right upper obe pulmonary nodule  (negative Pet Scan)   PVD (peripheral vascular disease)    left leg s/p stent   Sebaceous cyst    neck   Seborrheic keratosis    Seizures (HCC)    4-5 yrs ago had seizures when in ICU- none since this time    Sinusitis    Tobacco abuse    Tremor     Medications:  (Not in a hospital admission)  Scheduled:   budesonide -glycopyrrolate -formoterol   2 puff Inhalation BID   carvedilol   6.25 mg Oral BID WC   [START ON 03/24/2024] DULoxetine   60 mg Oral Daily   [START ON 03/24/2024] folic acid   1 mg Oral Daily   guaiFENesin   600 mg Oral BID   [START ON 03/24/2024] magnesium  oxide  400 mg Oral Daily   [START ON 03/24/2024] pantoprazole   40 mg Oral Daily   [START ON 03/24/2024] rosuvastatin   20 mg Oral Daily   [START ON 03/24/2024] thiamine   100 mg Oral Daily    Assessment: 84 YO male presenting with lower abdominal discomfort, increased urination, shortness of breath. Patient has a history of PE/DVT on Eliquis  at home. Last dose of Eliquis  5mg  was this morning (1/8) @0800 . MD requesting transition to therapeutic Lovenox  in the meantime in case procedures are needed while inpatient.   Today, 03/23/2024: Scr 0.91 Hgb 11.5--stable Plts 441--stable No  s/sx of bleeding reported  Goal of Therapy:  Monitor platelets by anticoagulation protocol: Yes   Plan:  Start Lovenox  60mg  (~1mg /kg) SQ q12h - first dose scheduled this evening when Eliquis  would have been given Monitor for s/sx of bleeding daily F/u possible procedure plans vs Eliquis  resumption    ADDENDUM: Soon after Lovenox  order was placed, CCM discontinued it in anticipation for add-on bronchoscopy tomorrow (1/9) @1500 . Plan: HOLD Lovenox  and prior to admission Eliquis  at this time, per CCM F/up bronchoscopy procedure and ability to resume anticoagulation   Lacinda Moats, PharmD Clinical Pharmacist  1/8/20262:46 PM    [1]  Allergies Allergen Reactions   Codeine Anaphylaxis   Hydrocodone  Itching

## 2024-03-23 NOTE — ED Triage Notes (Signed)
 BIB EMS from home for painful and odorous urination. Intermittent flank pain bilaterally.

## 2024-03-23 NOTE — ED Provider Notes (Signed)
 " [place] [facility]GENC[facility] DEPART[facility] AT [facility] Provider Note   CSN: [id number] Arrival date & time: 03/23/24  1003     Patient presents with: Dysuria   Timothy Wood is a 84 y.o. male.   The history is provided by the patient and medical records. No language interpreter was used.  Dysuria Presenting symptoms: dysuria      84 year old male history of DVT currently on Eliquis , tobacco use, GERD, PVD, lung cancer kidney disease, brought here via EMS from home for evaluation of urinary discomfort.  Patient states for the past several weeks he noticed increased urinary difficulty, and described more as a urinary discomfort, increased frequency and occasionally lower abdominal discomfort.  Furthermore, he also endorsed having intermittent shortness of breath and coughs.  This is an ongoing issue for more than 6 months.  He endorsed decrease in appetite and not eating and drinking quite as much but does not endorse any fever or chills no vomiting or diarrhea no constipation.  He lives at home by himself with his daughter who comes and visit him on a regular basis.  He walks using a walker.  He is compliant with his medication.  He is a long-term smoker.  Prior to Admission medications  Medication Sig Start Date End Date Taking? Authorizing Provider  Acetaminophen  500 MG capsule Take 1,000 mg by mouth daily as needed for fever.    [provider]  amLODipine  (NORVASC ) 5 MG tablet Take 1 tablet (5 mg total) by mouth daily. 06/01/23   Jordan, [name], MD  apixaban  (ELIQUIS ) 5 MG TABS tablet Take 1 tablet (5 mg total) by mouth 2 (two) times daily. 06/01/23   Jordan, [name], MD  carvedilol  (COREG ) 6.25 MG tablet Take 1 tablet (6.25 mg total) by mouth 2 (two) times daily with a meal. 11/04/23   Jordan, [name], MD  DULoxetine  (CYMBALTA ) 60 MG capsule Take 1 capsule (60 mg total) by mouth daily. 04/14/23   Jordan, [name], MD  EPINEPHrine  (EPIPEN  2-PAK) 0.3 mg/0.3 mL IJ SOAJ injection  To use daily if needed for severe allergy reaction. 08/20/21   Jordan, [name], MD  folic acid  (FOLVITE ) 1 MG tablet TAKE 1 TABLET BY MOUTH DAILY 05/24/23   Jordan, [name], MD  irbesartan  (AVAPRO ) 150 MG tablet Take 1 tablet (150 mg total) by mouth daily. 06/01/23   Jordan, [name], MD  magnesium  oxide (MAG-OX) 400 MG tablet Take 1 tablet (400 mg total) by mouth daily. 08/31/22   Jordan, [name], MD  omeprazole  (PRILOSEC) 20 MG capsule TAKE 1 CAPSULE BY MOUTH TWICE A DAY BEFORE MEALS 01/18/23   Jordan, [name], MD  potassium chloride  (KLOR-CON ) 10 MEQ tablet Take 1 tablet (10 mEq total) by mouth daily as needed (with lasix ). 06/01/23 09/29/23  Jordan, [name], MD  rosuvastatin  (CRESTOR ) 20 MG tablet Take 1 tablet (20 mg total) by mouth daily. 06/01/23   Jordan, [name], MD  thiamine  (VITAMIN B1) 100 MG tablet Take 1 tablet (100 mg total) by mouth daily. 06/01/23   Jordan, [name], MD    Allergies: Codeine and Hydrocodone     Review of Systems  Genitourinary:  Positive for dysuria.  All other systems reviewed and are negative.   Updated Vital Signs BP 125/67 (BP Location: Left Arm)   Pulse 71   Temp 97.6 F (36.4 C) (Axillary)   Resp 20   SpO2 95%   Physical Exam Constitutional:      General:  He is not in acute distress.    Appearance: He is well-developed.  HENT:     Head: Atraumatic.  Eyes:     Conjunctiva/sclera: Conjunctivae normal.  Cardiovascular:     Rate and Rhythm: Normal rate and regular rhythm.     Pulses: Normal pulses.     Heart sounds: Normal heart sounds.  Pulmonary:     Effort: Pulmonary effort is normal.     Breath sounds: Normal breath sounds. No wheezing, rhonchi or rales.  Abdominal:     Palpations: Abdomen is soft.     Tenderness: There is no abdominal tenderness. There is no right CVA tenderness or left CVA tenderness.  Musculoskeletal:     Cervical back: Normal range of motion and neck supple.     Comments: Weak but equal efforts to all 4 extremities  Skin:     General: Skin is warm.     Findings: No rash.  Neurological:     Mental Status: He is alert and oriented to person, place, and time.     (all labs ordered are listed, but only abnormal results are displayed) Labs Reviewed  CBC WITH DIFFERENTIAL/PLATELET - Abnormal; Notable for the following components:      Result Value   WBC 16.4 (*)    RBC 4.21 (*)    Hemoglobin 11.5 (*)    HCT 37.5 (*)    Platelets 441 (*)    Neutro Abs 13.6 (*)    Monocytes Absolute 1.5 (*)    Abs Immature Granulocytes 0.10 (*)    All other components within normal limits  COMPREHENSIVE METABOLIC PANEL WITH GFR - Abnormal; Notable for the following components:   Potassium 3.3 (*)    Glucose, Bld 142 (*)    Calcium  8.0 (*)    Albumin 3.0 (*)    AST 14 (*)    All other components within normal limits  URINALYSIS, ROUTINE W REFLEX MICROSCOPIC - Abnormal; Notable for the following components:   Specific Gravity, Urine >1.046 (*)    All other components within normal limits  EXPECTORATED SPUTUM ASSESSMENT W GRAM STAIN, RFLX TO RESP C  LIPASE, BLOOD  LEGIONELLA PNEUMOPHILA SEROGP 1 UR AG  STREP PNEUMONIAE URINARY ANTIGEN    EKG: None  Radiology: CT CHEST ABDOMEN PELVIS W CONTRAST Result Date: 03/23/2024 EXAM: CT CHEST, ABDOMEN AND PELVIS WITH CONTRAST 03/23/2024 11:47:03 AM TECHNIQUE: CT of the chest, abdomen and pelvis was performed with the administration of 100 mL of iohexol  (OMNIPAQUE ) 300 MG/ML solution. Multiplanar reformatted images are provided for review. Automated exposure control, iterative reconstruction, and/or weight based adjustment of the mA/kV was utilized to reduce the radiation dose to as low as reasonably achievable. COMPARISON: 04/22/2023, 06/03/2022, 08/06/2015 CLINICAL HISTORY: Painful and odorous urination, intermittent flank pain. FINDINGS: CHEST: MEDIASTINUM AND LYMPH NODES: Heart and pericardium are unremarkable. The central airways are clear. Mildly enlarged multistation mediastinal  lymph nodes. For example, a 1.1 cm AP window lymph node (axial 27), an enlarged subcarinal lymph node measuring 1.5 cm (axial 34), and an enlarged left suprahilar lymph node measuring 1 cm (axial 32). LUNGS AND PLEURA: Centrilobular emphysema. Postsurgical changes of a wedge resection along the right middle lobe laterally. Surgical anastomosis posteriorly in the right upper lobe consistent with resection. Mild diffuse bronchial wall thickening. Hazy ground glass airspace opacities dependently within the left upper lobe and the left lower lobe. There is a large thick-walled fluid and gas collection centered in the posterior left upper lobe measuring 7.2 x 8.1 x  9.6 cm. Small left pleural effusion. A few clustered tree-in-bud nodules are present in the right upper lobe, worrisome for endobronchial spread of infection. No pneumothorax. No pulmonary embolism. The posterior left apical segmental pulmonary artery courses through the medial aspect of the thick-walled left upper lobe fluid collection. ABDOMEN AND PELVIS: LIVER: Similar appearance of multiple left hepatic lobe cysts. GALLBLADDER AND BILE DUCTS: Distended gallbladder without wall thickening or radiopaque stones. No biliary ductal dilatation. SPLEEN: No acute abnormality. PANCREAS: No acute abnormality. ADRENAL GLANDS: Small left adrenal nodule measuring 7 mm, which in retrospect, was likely present previously, but much smaller suggesting a benign lesion such as an adenoma. KIDNEYS, URETERS AND BLADDER: Multiple bilateral renal cysts. No stones in the kidneys or ureters. No hydronephrosis. No perinephric or periureteral stranding. The urinary bladder is distended without focal abnormality. GI AND BOWEL: Stomach demonstrates no acute abnormality. Decompressed normal appendix. There is no bowel obstruction. REPRODUCTIVE ORGANS: Mild prostatomegaly. Findings of BPH also present. PERITONEUM AND RETROPERITONEUM: No ascites. No free air. VASCULATURE: Diffuse  calcified aortic atherosclerosis. No aneurysm or aortic dissection. Severe diffuse aortoiliac atherosclerosis. Infrarenal aortic stent appears patent. Left common iliac stent also appears patent. ABDOMINAL AND PELVIS LYMPH NODES: No lymphadenopathy. BONES AND SOFT TISSUES: Osteopenia. Multilevel degenerative disc disease throughout the spine. Chronic appearing compression fractures within the lumbar spine. Unchanged lesion in the right iliac bone, likely benign. No focal soft tissue abnormality. IMPRESSION: 1. Findings consistent with multifocal pneumonia in the left upper and left lower lobes. Large thick-walled fluid and gas collection in the posterior left upper lobe, measuring 7.2 x 8.1 x 9.6 cm, consistent with a large lung abscess. Of note, the posterior left apical segmental pulmonary artery courses through the medial aspect of the lung abscess. 2. Small left pleural effusion. Right upper lobe tree-in-bud nodules are present, consistent with endobronchial spread of the infection. 3. Mildly enlarged multistation mediastinal lymph nodes, likely reactive. 4. No pulmonary embolism. 5. No acute abnormality in the abdomen or pelvis. 6. Mild prostatomegaly with findings of BPH. Prominent Electronically signed by: Rogelia Myers MD MD 03/23/2024 12:13 PM EST RP Workstation: [id number]   DG Chest 2 View Result Date: 03/23/2024 CLINICAL DATA:  Cough and shortness of breath. EXAM: CHEST - 2 VIEW COMPARISON:  04/22/2023 FINDINGS: Lungs are adequately inflated demonstrate moderate focal consolidation over the left upper lobe likely pneumonia. No evidence of effusion. Postsurgical change over the right lung. Cardiomediastinal silhouette and remainder of the exam is unchanged. IMPRESSION: Moderate focal consolidation over the left upper lobe likely pneumonia. Recommend follow-up chest radiograph 4-6 weeks to document resolution. Electronically Signed   By: Toribio Agreste M.D.   On: 03/23/2024 11:07     .Critical  Care  Performed by: Nivia Colon, PA-C Authorized by: Nivia Colon, PA-C   Critical care provider statement:    Critical care time (minutes):  30   Critical care was time spent personally by me on the following activities:  Development of treatment plan with patient or surrogate, discussions with consultants, evaluation of patient's response to treatment, examination of patient, ordering and review of laboratory studies, ordering and review of radiographic studies, ordering and performing treatments and interventions, pulse oximetry, re-evaluation of patient's condition and review of old charts    Medications Ordered in the ED  sodium chloride  0.9 % bolus 1,000 mL (0 mLs Intravenous Stopped 03/23/24 1259)  iohexol  (OMNIPAQUE ) 300 MG/ML solution 100 mL (100 mLs Intravenous Contrast Given 03/23/24 1127)  piperacillin -tazobactam (ZOSYN ) IVPB 3.375 g (3.375 g  Intravenous New Bag/Given 03/23/24 1307)                                    Medical Decision Making Amount and/or Complexity of Data Reviewed Labs: ordered. Radiology: ordered.  Risk Prescription drug management. Decision regarding hospitalization.   BP 125/67 (BP Location: Left Arm)   Pulse 71   Temp 97.6 F (36.4 C) (Axillary)   Resp 20   SpO2 95%   39:44 AM  84 year old male history of DVT currently on Eliquis , tobacco use, GERD, PVD, lung cancer kidney disease, brought here via EMS from home for evaluation of urinary discomfort.  Patient states for the past several weeks he noticed increased urinary difficulty, and described more as a urinary discomfort, increased frequency and occasionally lower abdominal discomfort.  Furthermore, he also endorsed having intermittent shortness of breath and coughs.  This is an ongoing issue for more than 6 months.  He endorsed decrease in appetite and not eating and drinking quite as much but does not endorse any fever or chills no vomiting or diarrhea no constipation.  He lives at home by himself  with his daughter who comes and visit him on a regular basis.  He walks using a walker.  He is compliant with his medication.  He is a long-term smoker.  On exam patient is resting comfortably in no acute discomfort.  He does not exhibit any Respiratory discomfort.  Heart with normal rate and rhythm, lungs are clear, abdomen is soft nontender, bowel sounds are present.  Strength is weak but equal throughout.  He is mentating appropriately, he is alert and oriented x4.  Oncologist Dr. Gatha.   -Labs ordered, independently viewed and interpreted by me.  Labs remarkable for elevated white count of 16.4 concerning for infection.  Urinalysis without signs of UTI -The patient was maintained on a cardiac monitor.  I personally viewed and interpreted the cardiac monitored which showed an underlying rhythm of: Sinus rhythm -Imaging independently viewed and interpreted by me and I agree with radiologist's interpretation.  Result remarkable for CT scan of chest abdomen pelvis demonstrating multifocal pneumonia in the left upper and left lower lobes with a large lung abscess in the left upper lobe.  No evidence of PE.  Antibiotic including Rocephin  and Zithromax  started. -This patient presents to the ED for concern of sob, this involves an extensive number of treatment options, and is a complaint that carries with it a high risk of complications and morbidity.  The differential diagnosis includes PNA, PE, PTX, pleural effusion, COPD, asthma, UTI, malignancy -Co morbidities that complicate the patient evaluation includes DVT, tobacco use, lung CA, PVD -Treatment includes Zosyn  -Reevaluation of the patient after these medicines showed that the patient stayed the same -PCP office notes or outside notes reviewed -Discussion with specialist [facility] Dr. CHRISTOBAL who agrees to admit pt and request pulomonary team to be involve as well.  I have reached out to our intensivist who agrees to be involve in pt care.   -Escalation to admission/observation considered: patient is agreeable with admission.        Final diagnoses:  Abscess of upper lobe of left lung with pneumonia [facility])  Multifocal pneumonia    ED Discharge Orders     None          Nivia Colon, PA-C 03/23/24 1507  "

## 2024-03-23 NOTE — Consult Note (Signed)
 "  NAME:  Timothy Wood, MRN:  [id number], DOB:  May 22, 1940, LOS: 0 ADMISSION DATE:  03/23/2024, CONSULTATION DATE:  1/8 REFERRING MD:  Dr. Christobal, CHIEF COMPLAINT:  lung abscess   History of Present Illness:  Patient is a 84 yo M w/ pertinent PMH right lung cancer (diagnosed 2016; followed by Dr. Sherrod), DVT/PE on eliquis , COPD presents to [facility] on 1/8 w/ left lung abscess/pna.  Patient has been having several weeks of increase uine frequency and lower abd discomfort. Also noted to have poor appetite and lost about 20 lbs in the past few weeks. Having a productive cough and left sided chest pain.  He he reports sticky mucus, no foul smell .  No history of dysphagia or aspiration  He complains of shortness of breath after walking a few feet came to [facility] ED on 1/8 for further eval. On arrival afebrile and wbc 16.4. CXR showing consolidation in LUL. CT CAP showing multifocal pna in LUL and LLL; possible lung abscess in posterior LUL 7.2x8.1x9.6 cm. Exp sputum ordered and started on zosyn . PCCM consulted.   He is a smoker about a pack per day, when asked about quitting he states may be 2 weeks after my funeral  Pertinent  Medical History   Lung cancer status postresection 2016 Histoplasmosis, remote history for which he took antifungal for 2 months DVT/PE on Eliquis , last dose 1/8 AM  Past Medical History:  Diagnosis Date   Adenocarcinoma of lung, stage 1 (HCC)    Anemia    Angiodysplasia of cecum 11/23/2017   Multiple 1-2 mm   Arthritis    back possibly   Blood clotting disorder    Cataract    removed bilaterally    Chronic obstructive pulmonary disease (COPD) (HCC)    patient not aware   Clotting disorder    PE LUNG, dvt leg    Colon polyps    Depression    Diverticulosis    DVT (deep venous thrombosis) (HCC)    Post op  knee surgery   DVT (deep venous thrombosis) (HCC)    GERD (gastroesophageal reflux disease)    Histoplasmosis    Histoplasmosis with pneumonia 04/01/2015    Hypercholesteremia    Hyperlipidemia    Hypertension    Laceration of head 08/05/2015   Lumbar compression fracture (HCC)    Lung cancer (HCC) dx'd 2016   Personal history of colonic polyps 2008   multiple adenomas 2008-2009 exams with large cecal adenoma   Pneumonia    in history   Polyposis coli - attenuated 09/12/2010   2008: adenomas with 9 polyps index and 2.5 cm cecal polyp removed over 3 exams, last 2009 with APC. diverticulosis also. 11/24/2010: 12 polyps removed - adenomas Anticipate routine repeat colonoscopy Spring 2013 Have recommended he see genetics counselor re ? Polyposis 03/27/2016 14 polyps removed max 15 mm    Pulmonary nodule 06/2006   9 mm right upper obe pulmonary nodule (negative Pet Scan)   PVD (peripheral vascular disease)    left leg s/p stent   Sebaceous cyst    neck   Seborrheic keratosis    Seizures (HCC)    4-5 yrs ago had seizures when in ICU- none since this time    Sinusitis    Tobacco abuse    Tremor      Significant Hospital Events: Including procedures, antibiotic start and stop dates in addition to other pertinent events   1/8 admit w/ lung abscess  Interim History / Subjective:  Afebrile  Objective    Blood pressure 125/67, pulse 71, temperature 97.6 F (36.4 C), temperature source Axillary, resp. rate 20, SpO2 95%.        Intake/Output Summary (Last 24 hours) at 03/23/2024 1408 Last data filed at 03/23/2024 1259 Gross per 24 hour  Intake 1000 ml  Output --  Net 1000 ml   There were no vitals filed for this visit.  Examination: Elderly man, no distress, able to speak in full sentences S1-S2 regular Clear breath sounds bilateral No supraclavicular lymphadenopathy No pedal edema, 1+ cold pain  Resolved problem list   Assessment and Plan   Left lung abscess w/ multifocal pna Hx of stage 1a non-small cell lung cancer; adenocarcinoma of right lung: diagnosed 2016 Hx of COPD Hx of PE/DVT on eliquis  Tobacco abuse: smokes 2  packs/day Plan: - Zosyn  -obtain exp sputum if able. Send rvp, urine legionella/strep -trend wbc/fever curve -on breztri ; prn duoneb -pulm toiletry -on lovenox  currently per primary -smoking cessation counseling -consider nicotine  patch  We discussed conservative approach, treating with antibiotics and postural drainage and reimaging in 2 to 4 weeks versus aggressive approach of bronchoscopy Atypical features would be severe weight loss , heavy smoking and prior history of lung cancer makes this a possible differential.  We discussed risks and benefits of bronchoscopy procedure.  His last dose of Eliquis  was 1/8 AM.  I have tentatively scheduled him for a bronchoscopy as an add-on procedure tomorrow at 3 PM [anesthesia to confirm] .  Hold apixaban /Lovenox  in anticipation  Labs   CBC: Recent Labs  Lab 03/23/24 1044  WBC 16.4*  NEUTROABS 13.6*  HGB 11.5*  HCT 37.5*  MCV 89.1  PLT 441*    Basic Metabolic Panel: Recent Labs  Lab 03/23/24 1044  NA 139  K 3.3*  CL 99  CO2 29  GLUCOSE 142*  BUN 13  CREATININE 0.91  CALCIUM  8.0*   GFR: CrCl cannot be calculated (Unknown ideal weight.). Recent Labs  Lab 03/23/24 1044  WBC 16.4*    Liver Function Tests: Recent Labs  Lab 03/23/24 1044  AST 14*  ALT 11  ALKPHOS 121  BILITOT 0.4  PROT 6.5  ALBUMIN 3.0*   Recent Labs  Lab 03/23/24 1044  LIPASE 39   No results for input(s): AMMONIA in the last 168 hours.  ABG    Component Value Date/Time   PHART 7.403 01/25/2015 0415   PCO2ART 42.3 01/25/2015 0415   PO2ART 69.4 (L) 01/25/2015 0415   HCO3 25.8 (H) 01/25/2015 0415   TCO2 27.1 01/25/2015 0415   ACIDBASEDEF 1.3 01/22/2015 1417   O2SAT 94.7 01/25/2015 0415     Coagulation Profile: No results for input(s): INR, PROTIME in the last 168 hours.  Cardiac Enzymes: No results for input(s): CKTOTAL, CKMB, CKMBINDEX, TROPONINI in the last 168 hours.  HbA1C: Hgb A1c MFr Bld  Date/Time Value Ref  Range Status  01/26/2013 08:12 AM 5.7 4.6 - 6.5 % Final    Comment:    Glycemic Control Guidelines for People with Diabetes:Non Diabetic:  <6%Goal of Therapy: <7%Additional Action Suggested:  >8%     CBG: No results for input(s): GLUCAP in the last 168 hours.  Review of Systems:   Shortness of breath Cough with white sputum 20 to 30 pound weight loss Urinary discomfort, difficulty starting urine  Past Medical History:  He,  has a past medical history of Adenocarcinoma of lung, stage 1 (HCC), Anemia, Angiodysplasia of cecum (11/23/2017), Arthritis, Blood clotting disorder, Cataract, Chronic  obstructive pulmonary disease (COPD) (HCC), Clotting disorder, Colon polyps, Depression, Diverticulosis, DVT (deep venous thrombosis) (HCC), DVT (deep venous thrombosis) (HCC), GERD (gastroesophageal reflux disease), Histoplasmosis, Histoplasmosis with pneumonia (04/01/2015), Hypercholesteremia, Hyperlipidemia, Hypertension, Laceration of head (08/05/2015), Lumbar compression fracture (HCC), Lung cancer (HCC) (dx'd 2016), Personal history of colonic polyps (2008), Pneumonia, Polyposis coli - attenuated (09/12/2010), Pulmonary nodule (06/2006), PVD (peripheral vascular disease), Sebaceous cyst, Seborrheic keratosis, Seizures (HCC), Sinusitis, Tobacco abuse, and Tremor.   Surgical History:   Past Surgical History:  Procedure Laterality Date   ANGIOPLASTY / STENTING ILIAC  08/2008   Dr. Serene SANES SURGERY  1975, 1978   x2 lumbar fusion over 30 years ago   BACK SURGERY     COLONOSCOPY  05/11/2006; 07/27/2006;05/05/2007, 11/24/2010   adenomas with 9 polyps index and 2.5 cm cecal polyp removed over 3 exams, last 2009 with APC. diverticulosis also.  2012: 12 adenomas removed, largest 8-10 mm, diverticulosis   CORONARY ANGIOPLASTY WITH STENT PLACEMENT     DESCENDING AORTIC ANEURYSM REPAIR W/ STENT  04/2010   Dr. Serene   ESOPHAGOGASTRODUODENOSCOPY  05/05/2007   GERD - one erosion   EYE SURGERY Bilateral     cataract   KNEE SURGERY  2002   left- cartilage    LUNG BIOPSY     PERCUTANEOUS PINNING Left 06/24/2012   Procedure: Repair Complex Laceration Left Thumb with percutaneous pinning.;  Surgeon: Balinda JAYSON Rogue, MD;  Location: [facility] OR;  Service: Plastics;  Laterality: Left;   THUMB AMPUTATION Left    pinning and index finger   TONSILLECTOMY  1956   TONSILLECTOMY     UPPER GASTROINTESTINAL ENDOSCOPY     VIDEO ASSISTED THORACOSCOPY (VATS)/WEDGE RESECTION Right 01/24/2015   Procedure: RIGHT VIDEO ASSISTED THORACOSCOPY (VATS)/WEDGE RESECTION;  Surgeon: Elspeth JAYSON Millers, MD;  Location: [facility] OR;  Service: Thoracic;  Laterality: Right;   vocal cord polyps removed       Social History:   reports that he has been smoking cigarettes. He has a 60 pack-year smoking history. He has never used smokeless tobacco. He reports current alcohol use. He reports that he does not use drugs.   Family History:  His family history includes Cancer (age of onset: 59) in his mother; Colon cancer (age of onset: 16) in his mother; Deep vein thrombosis in his mother; Diabetes in an other family member; Heart disease in his brother and brother; Lung cancer in his mother and son; Rheum arthritis in his daughter and sister; Uterine cancer in his mother. There is no history of Colon polyps, Stomach cancer, or Rectal cancer.   Allergies Allergies[1]   Home Medications  Prior to Admission medications  Medication Sig Start Date End Date Taking? Authorizing Provider  Acetaminophen  500 MG capsule Take 1,000 mg by mouth daily as needed for fever.    [provider]  amLODipine  (NORVASC ) 5 MG tablet Take 1 tablet (5 mg total) by mouth daily. 06/01/23   Jordan, [name], MD  apixaban  (ELIQUIS ) 5 MG TABS tablet Take 1 tablet (5 mg total) by mouth 2 (two) times daily. 06/01/23   Jordan, [name], MD  carvedilol  (COREG ) 6.25 MG tablet Take 1 tablet (6.25 mg total) by mouth 2 (two) times daily with a meal. 11/04/23   Jordan, Betty  G, MD  DULoxetine  (CYMBALTA ) 60 MG capsule Take 1 capsule (60 mg total) by mouth daily. 04/14/23   Jordan, [name], MD  EPINEPHrine  (EPIPEN  2-PAK) 0.3 mg/0.3 mL IJ SOAJ injection To use daily if  needed for severe allergy reaction. 08/20/21   Jordan, [name], MD  folic acid  (FOLVITE ) 1 MG tablet TAKE 1 TABLET BY MOUTH DAILY 05/24/23   Jordan, [name], MD  irbesartan  (AVAPRO ) 150 MG tablet Take 1 tablet (150 mg total) by mouth daily. 06/01/23   Jordan, [name], MD  magnesium  oxide (MAG-OX) 400 MG tablet Take 1 tablet (400 mg total) by mouth daily. 08/31/22   Jordan, [name], MD  omeprazole  (PRILOSEC) 20 MG capsule TAKE 1 CAPSULE BY MOUTH TWICE A DAY BEFORE MEALS 01/18/23   Jordan, [name], MD  potassium chloride  (KLOR-CON ) 10 MEQ tablet Take 1 tablet (10 mEq total) by mouth daily as needed (with lasix ). 06/01/23 09/29/23  Jordan, [name], MD  rosuvastatin  (CRESTOR ) 20 MG tablet Take 1 tablet (20 mg total) by mouth daily. 06/01/23   Jordan, [name], MD  thiamine  (VITAMIN B1) 100 MG tablet Take 1 tablet (100 mg total) by mouth daily. 06/01/23   Jordan, [name], MD       Harden Staff MD. FCCP. [facility] Pager : [phone]  If no response to pager , please call [phone] until 7 pm After 7:00 pm call Elink  [phone]   03/23/2024           [1]  Allergies Allergen Reactions   Codeine Anaphylaxis   Hydrocodone  Itching   "

## 2024-03-24 ENCOUNTER — Encounter (HOSPITAL_COMMUNITY): Admission: EM | Disposition: A | Payer: Self-pay | Source: Home / Self Care | Attending: Internal Medicine

## 2024-03-24 ENCOUNTER — Encounter (HOSPITAL_COMMUNITY): Payer: Self-pay | Admitting: Internal Medicine

## 2024-03-24 ENCOUNTER — Inpatient Hospital Stay (HOSPITAL_COMMUNITY): Admitting: Certified Registered Nurse Anesthetist

## 2024-03-24 ENCOUNTER — Inpatient Hospital Stay (HOSPITAL_COMMUNITY)

## 2024-03-24 DIAGNOSIS — Z720 Tobacco use: Secondary | ICD-10-CM

## 2024-03-24 DIAGNOSIS — J984 Other disorders of lung: Secondary | ICD-10-CM

## 2024-03-24 HISTORY — PX: BRONCHIAL BRUSHINGS: SHX5108

## 2024-03-24 HISTORY — PX: VIDEO BRONCHOSCOPY: SHX5072

## 2024-03-24 HISTORY — PX: BRONCHIAL WASHINGS: SHX5105

## 2024-03-24 LAB — BASIC METABOLIC PANEL WITH GFR
Anion gap: 13 (ref 5–15)
BUN: 8 mg/dL (ref 8–23)
CO2: 24 mmol/L (ref 22–32)
Calcium: 7.5 mg/dL — ABNORMAL LOW (ref 8.9–10.3)
Chloride: 98 mmol/L (ref 98–111)
Creatinine, Ser: 0.63 mg/dL (ref 0.61–1.24)
GFR, Estimated: >60 mL/min
Glucose, Bld: 88 mg/dL (ref 70–99)
Potassium: 3.1 mmol/L — ABNORMAL LOW (ref 3.5–5.1)
Sodium: 135 mmol/L (ref 135–145)

## 2024-03-24 LAB — BODY FLUID CELL COUNT WITH DIFFERENTIAL
Lymphs, Fluid: 40 %
Lymphs, Fluid: 4 %
Monocyte-Macrophage-Serous Fluid: 10 % — ABNORMAL LOW (ref 50–90)
Monocyte-Macrophage-Serous Fluid: 5 % — ABNORMAL LOW (ref 50–90)
Neutrophil Count, Fluid: 50 % — ABNORMAL HIGH (ref 0–25)
Neutrophil Count, Fluid: 90 % — ABNORMAL HIGH (ref 0–25)
Total Nucleated Cell Count, Fluid: UNDETERMINED uL (ref 0–1000)
Total Nucleated Cell Count, Fluid: 133 uL (ref 0–1000)

## 2024-03-24 LAB — CBC
HCT: 33 % — ABNORMAL LOW (ref 39.0–52.0)
Hemoglobin: 10.4 g/dL — ABNORMAL LOW (ref 13.0–17.0)
MCH: 27.2 pg (ref 26.0–34.0)
MCHC: 31.5 g/dL (ref 30.0–36.0)
MCV: 86.4 fL (ref 80.0–100.0)
Platelets: 413 K/uL — ABNORMAL HIGH (ref 150–400)
RBC: 3.82 MIL/uL — ABNORMAL LOW (ref 4.22–5.81)
RDW: 14.9 % (ref 11.5–15.5)
WBC: 15.5 K/uL — ABNORMAL HIGH (ref 4.0–10.5)
nRBC: 0 % (ref 0.0–0.2)

## 2024-03-24 LAB — LEGIONELLA PNEUMOPHILA SEROGP 1 UR AG: L. pneumophila Serogp 1 Ur Ag: NEGATIVE

## 2024-03-24 MED ORDER — MAGNESIUM OXIDE -MG SUPPLEMENT 400 (240 MG) MG PO TABS
400.0000 mg | ORAL_TABLET | Freq: Every day | ORAL | Status: DC
  Administered 2024-03-24 – 2024-03-25 (×2): 400 mg via ORAL
  Filled 2024-03-24 (×2): qty 1

## 2024-03-24 MED ORDER — POTASSIUM CHLORIDE 10 MEQ/100ML IV SOLN
10.0000 meq | INTRAVENOUS | Status: AC
  Administered 2024-03-24 (×3): 10 meq via INTRAVENOUS
  Filled 2024-03-24 (×4): qty 100

## 2024-03-24 MED ORDER — NICOTINE 7 MG/24HR TD PT24
7.0000 mg | MEDICATED_PATCH | Freq: Every day | TRANSDERMAL | Status: DC
  Administered 2024-03-24 – 2024-03-25 (×2): 7 mg via TRANSDERMAL
  Filled 2024-03-24 (×2): qty 1

## 2024-03-24 MED ORDER — SUCCINYLCHOLINE CHLORIDE 200 MG/10ML IV SOSY
PREFILLED_SYRINGE | INTRAVENOUS | Status: DC | PRN
  Administered 2024-03-24: 80 mg via INTRAVENOUS

## 2024-03-24 MED ORDER — PHENYLEPHRINE HCL (PRESSORS) 10 MG/ML IV SOLN: INTRAVENOUS | Status: DC | PRN

## 2024-03-24 MED ORDER — FENTANYL CITRATE (PF) 100 MCG/2ML IJ SOLN
INTRAMUSCULAR | Status: AC
  Filled 2024-03-24: qty 2

## 2024-03-24 MED ORDER — PROPOFOL 10 MG/ML IV BOLUS
INTRAVENOUS | Status: AC
  Filled 2024-03-24: qty 20

## 2024-03-24 MED ORDER — ONDANSETRON HCL 4 MG/2ML IJ SOLN
INTRAMUSCULAR | Status: DC | PRN
  Administered 2024-03-24: 4 mg via INTRAVENOUS

## 2024-03-24 MED ORDER — ENSURE PLUS HIGH PROTEIN PO LIQD: 237.0000 mL | Freq: Two times a day (BID) | ORAL | Status: DC

## 2024-03-24 MED ORDER — LIDOCAINE HCL (CARDIAC) PF 100 MG/5ML IV SOSY
PREFILLED_SYRINGE | INTRAVENOUS | Status: DC | PRN
  Administered 2024-03-24: 100 mg via INTRAVENOUS

## 2024-03-24 MED ORDER — PHENYLEPHRINE 80 MCG/ML (10ML) SYRINGE FOR IV PUSH (FOR BLOOD PRESSURE SUPPORT)
PREFILLED_SYRINGE | INTRAVENOUS | Status: DC | PRN
  Administered 2024-03-24 (×3): 160 ug via INTRAVENOUS

## 2024-03-24 MED ORDER — DEXAMETHASONE SOD PHOSPHATE PF 10 MG/ML IJ SOLN
INTRAMUSCULAR | Status: DC | PRN
  Administered 2024-03-24: 5 mg via INTRAVENOUS

## 2024-03-24 MED ORDER — THIAMINE MONONITRATE 100 MG PO TABS
100.0000 mg | ORAL_TABLET | Freq: Every day | ORAL | Status: DC
  Administered 2024-03-24 – 2024-03-25 (×2): 100 mg via ORAL
  Filled 2024-03-24 (×2): qty 1

## 2024-03-24 MED ORDER — APIXABAN 5 MG PO TABS
5.0000 mg | ORAL_TABLET | Freq: Two times a day (BID) | ORAL | Status: DC
  Administered 2024-03-25: 5 mg via ORAL
  Filled 2024-03-24: qty 1

## 2024-03-24 MED ORDER — EPHEDRINE SULFATE-NACL 50-0.9 MG/10ML-% IV SOSY
PREFILLED_SYRINGE | INTRAVENOUS | Status: DC | PRN
  Administered 2024-03-24: 5 mg via INTRAVENOUS

## 2024-03-24 MED ORDER — PROPOFOL 10 MG/ML IV BOLUS
INTRAVENOUS | Status: DC | PRN
  Administered 2024-03-24: 100 mg via INTRAVENOUS

## 2024-03-24 MED ORDER — FENTANYL CITRATE (PF) 100 MCG/2ML IJ SOLN
INTRAMUSCULAR | Status: DC | PRN
  Administered 2024-03-24 (×2): 50 ug via INTRAVENOUS

## 2024-03-24 MED ORDER — SODIUM CHLORIDE 0.9 % IV SOLN: INTRAVENOUS | Status: DC | PRN

## 2024-03-24 NOTE — Anesthesia Postprocedure Evaluation (Signed)
"   Anesthesia Post Note  Patient: Timothy Wood  Procedure(s) Performed: BRONCHOSCOPY, WITH FLUOROSCOPY IRRIGATION, BRONCHUS BRONCHOSCOPY, WITH BRUSH BIOPSY     Patient location during evaluation: PACU Anesthesia Type: General Level of consciousness: awake and alert Pain management: pain level controlled Vital Signs Assessment: post-procedure vital signs reviewed and stable Respiratory status: spontaneous breathing, nonlabored ventilation and respiratory function stable Cardiovascular status: blood pressure returned to baseline Postop Assessment: no apparent nausea or vomiting Anesthetic complications: no   No notable events documented.  Last Vitals:  Vitals:   03/24/24 1720 03/24/24 1725  BP: (!) 138/58 (!) 104/51  Pulse: 78 77  Resp: (!) 24 18  Temp:    SpO2: 94% 93%    Last Pain:  Vitals:   03/24/24 1710  TempSrc: Temporal  PainSc: 0-No pain   Pain Goal:                   Vertell Row      "

## 2024-03-24 NOTE — Transfer of Care (Signed)
 Immediate Anesthesia Transfer of Care Note  Patient: Timothy Wood  Procedure(s) Performed: BRONCHOSCOPY, WITH FLUOROSCOPY IRRIGATION, BRONCHUS BRONCHOSCOPY, WITH BRUSH BIOPSY  Patient Location: PACU and Endoscopy Unit  Anesthesia Type:General  Level of Consciousness: awake and alert   Airway & Oxygen Therapy: Patient Spontanous Breathing and Patient connected to face mask oxygen  Post-op Assessment: Report given to RN and Post -op Vital signs reviewed and stable  Post vital signs: Reviewed and stable  Last Vitals:  Vitals Value Taken Time  BP 111/56 03/24/24 17:10  Temp    Pulse 77 03/24/24 17:12  Resp 23 03/24/24 17:12  SpO2 97 % 03/24/24 17:12  Vitals shown include unfiled device data.  Last Pain:  Vitals:   03/24/24 1710  TempSrc: Temporal  PainSc: 0-No pain         Complications: No notable events documented.

## 2024-03-24 NOTE — Progress Notes (Signed)
 Per [facility] msg with Dr. Theophilus, he would like the Eliquis  to be resumed back, s/p bronchoscopy, on 03/25/24 at 10AM.  Will resume pt's PTA  Eliquis  dose of 5 mg bid on 03/25/24 at 10 AM.  Pharmacy will sign off. Re-consult us  if need further assistance.  Iantha Batch, PharmD, BCPS 03/24/2024 5:31 PM

## 2024-03-24 NOTE — Plan of Care (Signed)

## 2024-03-24 NOTE — Op Note (Signed)
 [facility] Cardiopulmonary Patient Name: Granite Godman Procedure Date: 03/24/2024 MRN: [id number] Attending MD: Lonna Coder , MD, [id number] Date of Birth: 06-07-1940 CSN: [id number] Age: 84 Admit Type: Inpatient Ethnicity: Not Hispanic or Latino Procedure:             Bronchoscopy Indications:           Abnormal CT scan of chest Providers:             Lonna Coder, MD, Ozell Pouch, Curtistine Bishop,                         Technician Referring MD:           Medicines:             General Anesthesia Complications:         No immediate complications Estimated Blood Loss:  Estimated blood loss: none. Procedure:      Pre-Anesthesia Assessment:      - A History and Physical has been performed. Patient meds and allergies       have been reviewed. The risks and benefits of the procedure and the       sedation options and risks were discussed with the patient. All       questions were answered and informed consent was obtained. Patient       identification and proposed procedure were verified prior to the       procedure by the physician in the pre-procedure area. Mental Status       Examination: alert and oriented. Airway Examination: normal       oropharyngeal airway. Respiratory Examination: clear to auscultation. CV       Examination: RRR, no murmurs, no S3 or S4. ASA Grade Assessment: II - A       patient with mild systemic disease. After reviewing the risks and       benefits, the patient was deemed in satisfactory condition to undergo       the procedure. The anesthesia plan was to use general anesthesia.       Immediately prior to administration of medications, the patient was       re-assessed for adequacy to receive sedatives. The heart rate,       respiratory rate, oxygen saturations, blood pressure, adequacy of       pulmonary ventilation, and response to care were monitored throughout       the procedure. The physical status of the patient was  re-assessed after       the procedure.      After obtaining informed consent, the bronchoscope was passed under       direct vision. Throughout the procedure, the patient's blood pressure,       pulse, and oxygen saturations were monitored continuously. the [id number]       ([id number] Olympus bronchoscope was introduced through the nose, via the       endotracheal tube (the patient was intubated for the procedure) and       advanced to the tracheobronchial tree of both lungs. Findings:      The endotracheal tube is in good position. The visualized portion of the       trachea is of normal caliber. The carina is sharp. The tracheobronchial       tree was examined to at least the first subsegmental level. Bronchial       mucosa and anatomy are normal; there are no  endobronchial lesions, and       no secretions.      Left Lung Abnormalities: Mucopurulent secretions were found in the       apical posterior segment of the left upper lobe, in the anterior segment       of the left upper lobe (B3) and in the superior segment of the left       lower lobe (B6). They were not obstructing the airway.      The bronchoscope was advanced until wedged at the desired location for       bronchoalveolar lavage. BAL was performed in the LUL apical posterior       segments (B1 & B2) and in the LLL superior segment (B6) of the lung and       sent for cell count, bacterial culture, viral smears & culture, and       fungal & AFB analysis and cytology. 120 mL of fluid were instilled. 50       mL were returned. The return was mucopurulent. Mucous plugs were present       in the return fluid. Multiple specimens were obtained and pooled into       one specimen, which was sent for analysis.      Fluoroscopy guided transbronchial brushings of a lesion were obtained in       the apical-posterior segment of the left upper lobe, in the anterior       segment of the left upper lobe and in the superior segment of the left        lower lobe with a cytology brush and sent for routine cytology. Three       samples were obtained. Transbronchial brushing technique was selected       because the sampling site was not accessible using standard endoscopic       (bronchoscopic) techniques. Impression:      - Abnormal CT scan of chest      - The airway examination was normal.      - Mucopurulent secretions were found in the apical posterior segment of       the left upper lobe, in the anterior segment of the left upper lobe (B3)       and in the superior segment of the left lower lobe (B6).      - Bronchoalveolar lavage was performed.      - Transbronchial brushings were obtained. Moderate Sedation:      GA Recommendation:      - Await BAL, brushing, culture and cytology results. Procedure Code(s):      --- Professional ---      [phone], Bronchoscopy, rigid or flexible, including fluoroscopic guidance,       when performed; with bronchial alveolar lavage      [phone], Bronchoscopy, rigid or flexible, including fluoroscopic guidance,       when performed; with brushing or protected brushings Diagnosis Code(s):      --- Professional ---      R93.89, Abnormal findings on diagnostic imaging of other specified body       structures CPT copyright 2022 [employer]. All rights reserved. The codes documented in this report are preliminary and upon coder review may  be revised to meet current compliance requirements. Eean Buss, MD Girard Koontz, MD 03/24/2024 5:09:43 PM Number of Addenda: 0 Scope In: Scope Out:

## 2024-03-24 NOTE — Progress Notes (Signed)
 " PROGRESS NOTE Timothy Wood  FMW:[id number] DOB: 1941/03/08 DOA: 03/23/2024 PCP: Jordan, [name], MD  Brief Narrative/Hospital Course: Timothy Wood is a 84 y.o. male who lives at home by himself, using walker at baseline, and daughter visiting him regularly and with PMH of right lung cancer >5 years ago, DVT PE on Eliquis , HTN, PVD/HLD, COPD,GERD presents to the ED with multiple complaints including several weeks of difficulty with urination with [facility] male increasing frequency, lower abdominal discomfort and also with shortness of breath decreasing appetite. He lost about 20 lb is last 3 weeks. She has productive cough, intermittent chest pain on left Patient otherwise denies any nausea, vomiting, chest pain, fever, chills, headache, focal weakness, numbness tingling, speech difficulties   In the ED: Vitals stable afebrile not hypoxic.  Labs with leukocytosis 10.4, mild hypokalemia anemia UA. CXR: Moderate focal consolidation left upper lobe CT chest abd pelvis w: multifocal pneumonia in the left upper and left lower lobes. Large thick-walled fluid and gas collection in the posterior left upper lobe, measuring 7.2 x 8.1 x 9.6 cm, consistent with a large lung abscess. Of note, the posterior left apical segmental pulmonary artery courses through the medial aspect of the lung abscess.Small left pleural effusion. Right upper lobe tree-in-bud nodules are present, consistent with endobronchial spread of the infection.  Subjective: Seen and examined Patient reports he feels much improved being on antibiotics Overnight afebrile BP stable on room air.  Labs pending this morning-subsequently resulted potassium at 3.1 leukocytosis improving  Assessment and plan:  Left lung abscess Multifocal pneumonia in left upper lobe/lower lobes History of COPD History of stage Ia NSCLC /ACC right lung- 2016 Ongoing tobacco abuse: Appreciate pulmonary input.  Respiratory status remains stable.  Follow-up  sputum culture urinary antigens.  Continues on Zosyn  Aggressive bronchodilator with Breztri , as needed DuoNeb pulmonary toileting He does have significant weight loss with active smoking history-planning for bronchoscopy on/9 Encourage smoking cessation. Requesting nicotine  patch He is followed by Dr. Sherrod and reprots after biopsy  they got all cancer out  Hypokalemia: Replace and recheck   Mild anemia: Suspect from chronic illness.  Trend  FTT 20 pound weight loss in 2-3 weeks: RD eval. augment diet  History of pulmonary embolus/DVT x 4: Patient on Eliquis -held for procedure and placed on therapeutic Lovenox -anticoagulation on hold for bronchoscopy  Essential hypertension: well-controlled. Holding home amlodipine  , continue Coreg   Hyperlipemia Peripheral vascular disease: Continue statin  DVT prophylaxis: lovenox  SCDs Start: 03/23/24 1355Code Status:   Code Status: Limited: Do not attempt resuscitation (DNR) -DNR-LIMITED -Do Not Intubate/DNI  he wishes not to have CPR and intubation and is DNR Family Communication: plan of care discussed with patient at bedside. Patient status is: Remains hospitalized because of severity of illness Level of care: Telemetry   Dispo: The patient is from: home alone, daughter lives close by            Anticipated disposition: TBD Objective: Vitals last 24 hrs: Vitals:   03/23/24 2028 03/23/24 2307 03/24/24 0412 03/24/24 0954  BP: 107/62  132/70 139/75  Pulse: 80  73 72  Resp:      Temp: 98 F (36.7 C)  98.1 F (36.7 C)   TempSrc: Oral  Oral   SpO2: 97% 96% 93%   Weight:      Height:        Physical Examination: General exam: AAOX3 HEENT:Oral mucosa moist, Ear/Nose WNL grossly Respiratory system: Bilaterally clear BS,no use of accessory muscle Cardiovascular system: S1 &  S2 +, No JVD. Gastrointestinal system: Abdomen soft,NT,ND, BS+ Nervous System: Alert, awake, moving all extremities,and following commands. Extremities:  extremities warm, leg edema neg Skin: Warm, no rashes MSK: Normal muscle bulk,tone, power   Medications reviewed:  Scheduled Meds:  budesonide -glycopyrrolate -formoterol   2 puff Inhalation BID   carvedilol   6.25 mg Oral BID WC   DULoxetine   60 mg Oral Daily   folic acid   1 mg Oral Daily   guaiFENesin   600 mg Oral BID   magnesium  oxide  400 mg Oral Daily   nicotine   7 mg Transdermal Daily   pantoprazole   40 mg Oral Daily   rosuvastatin   20 mg Oral Daily   thiamine   100 mg Oral Daily   Continuous Infusions:  piperacillin -tazobactam (ZOSYN )  IV 3.375 Wood (03/24/24 0438)   Diet: Diet Order             Diet NPO time specified  Diet effective ____                   Data Reviewed: I have personally reviewed following labs and imaging studies ( see epic result tab) CBC: Recent Labs  Lab 03/23/24 1044 03/24/24 0724  WBC 16.4* 15.5*  NEUTROABS 13.6*  --   HGB 11.5* 10.4*  HCT 37.5* 33.0*  MCV 89.1 86.4  PLT 441* 413*   CMP: Recent Labs  Lab 03/23/24 1044 03/24/24 0724  NA 139 135  K 3.3* 3.1*  CL 99 98  CO2 29 24  GLUCOSE 142* 88  BUN 13 8  CREATININE 0.91 0.63  CALCIUM  8.0* 7.5*   GFR: Estimated Creatinine Clearance: 61.4 mL/min (by C-Wood formula based on SCr of 0.63 mg/dL). Recent Labs  Lab 03/23/24 1044  AST 14*  ALT 11  ALKPHOS 121  BILITOT 0.4  PROT 6.5  ALBUMIN 3.0*    Recent Labs  Lab 03/23/24 1044  LIPASE 39   No results for input(s): AMMONIA in the last 168 hours. Coagulation Profile: No results for input(s): INR, PROTIME in the last 168 hours. Unresulted Labs (From admission, onward)     Start     Ordered   03/25/24 0500  Basic metabolic panel with GFR  Tomorrow morning,   R        03/24/24 0808   03/25/24 0500  CBC  Tomorrow morning,   R        03/24/24 0808   03/23/24 1355  Expectorated Sputum Assessment w Gram Stain, Rflx to Resp Cult  (COPD / Pneumonia / Cellulitis / Lower Extremity Wound (Diabetic Foot Infection))  Once,   R         03/23/24 1355   03/23/24 1355  Legionella Pneumophila Serogp 1 Ur Ag  (COPD / Pneumonia / Cellulitis / Lower Extremity Wound (Diabetic Foot Infection))  Once,   R        03/23/24 1355           Antimicrobials/Microbiology: Anti-infectives (From admission, onward)    Start     Dose/Rate Route Frequency Ordered Stop   03/23/24 2100  piperacillin -tazobactam (ZOSYN ) IVPB 3.375 Wood        3.375 Wood 12.5 mL/hr over 240 Minutes Intravenous Every 8 hours 03/23/24 1349     03/23/24 1300  cefTRIAXone  (ROCEPHIN ) 1 Wood in sodium chloride  0.9 % 100 mL IVPB  Status:  Discontinued        1 Wood 200 mL/hr over 30 Minutes Intravenous  Once 03/23/24 1257 03/23/24 1259   03/23/24 1300  azithromycin  (ZITHROMAX ) 500 mg in sodium chloride  0.9 % 250 mL IVPB  Status:  Discontinued        500 mg 250 mL/hr over 60 Minutes Intravenous  Once 03/23/24 1257 03/23/24 1259   03/23/24 1300  piperacillin -tazobactam (ZOSYN ) IVPB 3.375 Wood        3.375 Wood 100 mL/hr over 30 Minutes Intravenous  Once 03/23/24 1259 03/23/24 1337         Component Value Date/Time   SDES BLOOD RIGHT HAND 06/04/2022 0449   SPECREQUEST  06/04/2022 0449    BOTTLES DRAWN AEROBIC AND ANAEROBIC Blood Culture adequate volume   CULT  06/04/2022 0449    NO GROWTH 5 DAYS Performed at [facility], [address] [place], [place] [address]    REPTSTATUS 06/09/2022 FINAL 06/04/2022 0449    Procedures: Procedures (LRB): VIDEO BRONCHOSCOPY WITHOUT FLUORO (N/A)   Mennie LAMY, MD [facility] 03/24/2024, 11:25 AM   "

## 2024-03-24 NOTE — Anesthesia Procedure Notes (Signed)
 Procedure Name: Intubation Date/Time: 03/24/2024 4:26 PM  Performed by: Belvie Valri NOVAK, CRNAPre-anesthesia Checklist: Patient identified, Emergency Drugs available, Suction available and Patient being monitored Patient Re-evaluated:Patient Re-evaluated prior to induction Oxygen Delivery Method: Circle System Utilized Preoxygenation: Pre-oxygenation with 100% oxygen Induction Type: IV induction Ventilation: Mask ventilation without difficulty Laryngoscope Size: Mac and 4 Grade View: Grade I Tube type: Oral Tube size: 7.0 mm Number of attempts: 1 Airway Equipment and Method: Stylet and Oral airway Placement Confirmation: ETT inserted through vocal cords under direct vision, positive ETCO2 and breath sounds checked- equal and bilateral Secured at: 22 cm Tube secured with: Tape Dental Injury: Teeth and Oropharynx as per pre-operative assessment

## 2024-03-24 NOTE — Plan of Care (Signed)
 " Problem: Education: Goal: Knowledge of General Education information will improve Description: Including pain rating scale, medication(s)/side effects and non-pharmacologic comfort measures 03/24/2024 1432 by Fayette Brigida CROME, RN Outcome: Progressing 03/24/2024 1432 by Fayette Brigida CROME, RN Outcome: Progressing   Problem: Health Behavior/Discharge Planning: Goal: Ability to manage health-related needs will improve 03/24/2024 1432 by Fayette Brigida CROME, RN Outcome: Progressing 03/24/2024 1432 by Fayette Brigida CROME, RN Outcome: Progressing   Problem: Clinical Measurements: Goal: Ability to maintain clinical measurements within normal limits will improve 03/24/2024 1432 by Fayette Brigida CROME, RN Outcome: Progressing 03/24/2024 1432 by Fayette Brigida CROME, RN Outcome: Progressing Goal: Will remain free from infection 03/24/2024 1432 by Fayette Brigida CROME, RN Outcome: Progressing 03/24/2024 1432 by Fayette Brigida CROME, RN Outcome: Progressing Goal: Diagnostic test results will improve 03/24/2024 1432 by Fayette Brigida CROME, RN Outcome: Progressing 03/24/2024 1432 by Fayette Brigida CROME, RN Outcome: Progressing Goal: Respiratory complications will improve 03/24/2024 1432 by Fayette Brigida CROME, RN Outcome: Progressing 03/24/2024 1432 by Fayette Brigida CROME, RN Outcome: Progressing Goal: Cardiovascular complication will be avoided 03/24/2024 1432 by Fayette Brigida CROME, RN Outcome: Progressing 03/24/2024 1432 by Fayette Brigida CROME, RN Outcome: Progressing   Problem: Activity: Goal: Risk for activity intolerance will decrease 03/24/2024 1432 by Fayette Brigida CROME, RN Outcome: Progressing 03/24/2024 1432 by Fayette Brigida CROME, RN Outcome: Progressing   Problem: Nutrition: Goal: Adequate nutrition will be maintained 03/24/2024 1432 by Fayette Brigida CROME, RN Outcome: Progressing 03/24/2024 1432 by Fayette Brigida CROME, RN Outcome: Progressing   Problem: Coping: Goal: Level of anxiety will decrease 03/24/2024 1432 by Fayette Brigida CROME, RN Outcome: Progressing 03/24/2024 1432 by Fayette Brigida CROME,  RN Outcome: Progressing   Problem: Elimination: Goal: Will not experience complications related to bowel motility 03/24/2024 1432 by Fayette Brigida CROME, RN Outcome: Progressing 03/24/2024 1432 by Fayette Brigida CROME, RN Outcome: Progressing Goal: Will not experience complications related to urinary retention 03/24/2024 1432 by Fayette Brigida CROME, RN Outcome: Progressing 03/24/2024 1432 by Fayette Brigida CROME, RN Outcome: Progressing   Problem: Pain Managment: Goal: General experience of comfort will improve and/or be controlled 03/24/2024 1432 by Fayette Brigida CROME, RN Outcome: Progressing 03/24/2024 1432 by Fayette Brigida CROME, RN Outcome: Progressing   Problem: Safety: Goal: Ability to remain free from injury will improve 03/24/2024 1432 by Fayette Brigida CROME, RN Outcome: Progressing 03/24/2024 1432 by Fayette Brigida CROME, RN Outcome: Progressing   Problem: Skin Integrity: Goal: Risk for impaired skin integrity will decrease 03/24/2024 1432 by Fayette Brigida CROME, RN Outcome: Progressing 03/24/2024 1432 by Fayette Brigida CROME, RN Outcome: Progressing   Problem: Education: Goal: Knowledge of disease or condition will improve 03/24/2024 1432 by Fayette Brigida CROME, RN Outcome: Progressing 03/24/2024 1432 by Fayette Brigida CROME, RN Outcome: Progressing Goal: Knowledge of the prescribed therapeutic regimen will improve 03/24/2024 1432 by Fayette Brigida CROME, RN Outcome: Progressing 03/24/2024 1432 by Fayette Brigida CROME, RN Outcome: Progressing Goal: Individualized Educational Video(s) 03/24/2024 1432 by Fayette Brigida CROME, RN Outcome: Progressing 03/24/2024 1432 by Fayette Brigida CROME, RN Outcome: Progressing   Problem: Activity: Goal: Ability to tolerate increased activity will improve 03/24/2024 1432 by Fayette Brigida CROME, RN Outcome: Progressing 03/24/2024 1432 by Fayette Brigida CROME, RN Outcome: Progressing Goal: Will verbalize the importance of balancing activity with adequate rest periods 03/24/2024 1432 by Fayette Brigida CROME, RN Outcome: Progressing 03/24/2024 1432 by Fayette Brigida CROME,  RN Outcome: Progressing   Problem: Respiratory: Goal: Ability to maintain a clear airway will improve 03/24/2024 1432 by Fayette Brigida CROME, RN  Outcome: Progressing 03/24/2024 1432 by Fayette Brigida CROME, RN Outcome: Progressing Goal: Levels of oxygenation will improve Outcome: Progressing Goal: Ability to maintain adequate ventilation will improve Outcome: Progressing   Problem: Activity: Goal: Ability to tolerate increased activity will improve Outcome: Progressing   Problem: Clinical Measurements: Goal: Ability to maintain a body temperature in the normal range will improve Outcome: Progressing   Problem: Respiratory: Goal: Ability to maintain adequate ventilation will improve Outcome: Progressing Goal: Ability to maintain a clear airway will improve Outcome: Progressing   "

## 2024-03-24 NOTE — Progress Notes (Signed)
 SLP Cancellation Note  Patient Details Name: Timothy Wood MRN: [id number] DOB: November 09, 1940   Cancelled treatment:        Attempted to see pt for swallowing assessment. Pt is NPO for procedure planned for later this afternoon.  If pt is able to participate in PO trials later today, please reach out to [facility] SLP group via secure chat to check SLP availability.   Anette FORBES Grippe, MA, CCC-SLP Acute Rehabilitation Services Office: [phone] 03/24/2024, 8:17 AM

## 2024-03-24 NOTE — Anesthesia Preprocedure Evaluation (Addendum)
"                                    Anesthesia Evaluation  Patient identified by MRN, date of birth, ID band Patient awake    Reviewed: Allergy & Precautions, NPO status , Patient's Chart, lab work & pertinent test results, reviewed documented beta blocker date and time   History of Anesthesia Complications Negative for: history of anesthetic complications  Airway Mallampati: II  TM Distance: >3 FB Neck ROM: Full    Dental  (+) Edentulous Lower, Edentulous Upper   Pulmonary COPD, Current Smoker and Patient abstained from smoking. Lung abscess   Pulmonary exam normal        Cardiovascular hypertension, Pt. on medications and Pt. on home beta blockers + CAD and + Peripheral Vascular Disease  Normal cardiovascular exam  TTE 05/2022: EF 60-65%, grade I DD, valves ok    Neuro/Psych   Anxiety Depression    negative neurological ROS     GI/Hepatic Neg liver ROS,GERD  Medicated,,  Endo/Other  negative endocrine ROS    Renal/GU negative Renal ROS     Musculoskeletal negative musculoskeletal ROS (+)    Abdominal   Peds  Hematology negative hematology ROS (+)   Anesthesia Other Findings   Reproductive/Obstetrics                              Anesthesia Physical Anesthesia Plan  ASA: 2  Anesthesia Plan: General   Post-op Pain Management: Minimal or no pain anticipated   Induction: Intravenous  PONV Risk Score and Plan: 2 and Treatment may vary due to age or medical condition, Ondansetron  and Dexamethasone   Airway Management Planned: Oral ETT  Additional Equipment: None  Intra-op Plan:   Post-operative Plan: Extubation in OR  Informed Consent: I have reviewed the patients History and Physical, chart, labs and discussed the procedure including the risks, benefits and alternatives for the proposed anesthesia with the patient or authorized representative who has indicated his/her understanding and acceptance.     Dental  advisory given  Plan Discussed with: CRNA  Anesthesia Plan Comments:          Anesthesia Quick Evaluation  "

## 2024-03-24 NOTE — Progress Notes (Signed)
 "  NAME:  Timothy Wood, MRN:  [id number], DOB:  03-01-1941, LOS: 1 ADMISSION DATE:  03/23/2024, CONSULTATION DATE:  1/8 REFERRING MD:  Dr. Christobal, CHIEF COMPLAINT:  lung abscess   History of Present Illness:  Patient is a 84 yo M w/ pertinent PMH right lung cancer (diagnosed 2016; followed by Dr. Sherrod), DVT/PE on eliquis , COPD presents to [facility] on 1/8 w/ left lung abscess/pna.  Patient has been having several weeks of increase uine frequency and lower abd discomfort. Also noted to have poor appetite and lost about 20 lbs in the past few weeks. Having a productive cough and left sided chest pain.  He he reports sticky mucus, no foul smell .  No history of dysphagia or aspiration  He complains of shortness of breath after walking a few feet came to [facility] ED on 1/8 for further eval. On arrival afebrile and wbc 16.4. CXR showing consolidation in LUL. CT CAP showing multifocal pna in LUL and LLL; possible lung abscess in posterior LUL 7.2x8.1x9.6 cm. Exp sputum ordered and started on zosyn . PCCM consulted.  He is a smoker about a pack per day, when asked about quitting he states may be 2 weeks after my funeral  Pertinent  Medical History   Lung cancer status postresection 2016 Histoplasmosis, remote history for which he took antifungal for 2 months DVT/PE on Eliquis , last dose 1/8 AM  Past Medical History:  Diagnosis Date   Adenocarcinoma of lung, stage 1 (HCC)    Anemia    Angiodysplasia of cecum 11/23/2017   Multiple 1-2 mm   Arthritis    back possibly   Blood clotting disorder    Cataract    removed bilaterally    Chronic obstructive pulmonary disease (COPD) (HCC)    patient not aware   Clotting disorder    PE LUNG, dvt leg    Colon polyps    Depression    Diverticulosis    DVT (deep venous thrombosis) (HCC)    Post op  knee surgery   DVT (deep venous thrombosis) (HCC)    GERD (gastroesophageal reflux disease)    Histoplasmosis    Histoplasmosis with pneumonia 04/01/2015    Hypercholesteremia    Hyperlipidemia    Hypertension    Laceration of head 08/05/2015   Lumbar compression fracture (HCC)    Lung cancer (HCC) dx'd 2016   Personal history of colonic polyps 2008   multiple adenomas 2008-2009 exams with large cecal adenoma   Pneumonia    in history   Polyposis coli - attenuated 09/12/2010   2008: adenomas with 9 polyps index and 2.5 cm cecal polyp removed over 3 exams, last 2009 with APC. diverticulosis also. 11/24/2010: 12 polyps removed - adenomas Anticipate routine repeat colonoscopy Spring 2013 Have recommended he see genetics counselor re ? Polyposis 03/27/2016 14 polyps removed max 15 mm    Pulmonary nodule 06/2006   9 mm right upper obe pulmonary nodule (negative Pet Scan)   PVD (peripheral vascular disease)    left leg s/p stent   Sebaceous cyst    neck   Seborrheic keratosis    Seizures (HCC)    4-5 yrs ago had seizures when in ICU- none since this time    Sinusitis    Tobacco abuse    Tremor      Significant Hospital Events: Including procedures, antibiotic start and stop dates in addition to other pertinent events   1/8 admit w/ lung abscess  Interim History / Subjective:  No acute events overnight  Objective    Blood pressure 132/70, pulse 73, temperature 98.1 F (36.7 C), temperature source Oral, resp. rate 20, height 5' 8 (1.727 m), weight 62 kg, SpO2 93%.        Intake/Output Summary (Last 24 hours) at 03/24/2024 0812 Last data filed at 03/24/2024 0745 Gross per 24 hour  Intake 1044.07 ml  Output 100 ml  Net 944.07 ml   Filed Weights   03/23/24 1432  Weight: 62 kg    Examination: Awake, chronically ill-appearing No distress Lungs are clear to auscultation Heart rate with regular rate and rhythm. Extremities with no edema  Resolved problem list   Assessment and Plan   Left lung abscess w/ multifocal pna Hx of stage 1a non-small cell lung cancer; adenocarcinoma of right lung: diagnosed 2016 Hx of COPD Hx of  PE/DVT on eliquis  Tobacco abuse: smokes 2 packs/day Plan:  Continue Zosyn , follow cultures On bronchodilators Smoking cessation, nicotine  patch Apixaban  on hold since yesterday a.m. Tentatively scheduled for bronchoscopy today pending confirmation by anesthesia as an add-on case.   Will plan on BAL and brushing Avoid transbronchial biopsy since he was on anticoagulation yesterday and presence of pulmonary artery in the medial aspect of lung abscess makes blind biopsy prohibitively risky. Risk/Benefit has been discussed in detail with patient and he has agreed to proceed.  Signature:   Demontae Antunes MD  Pulmonary & Critical care See Amion for pager  If no response to pager , please call [phone] until 7pm After 7:00 pm call Elink  [phone] 03/24/2024, 8:13 AM          "

## 2024-03-25 ENCOUNTER — Other Ambulatory Visit (HOSPITAL_COMMUNITY): Payer: Self-pay

## 2024-03-25 ENCOUNTER — Telehealth: Payer: Self-pay | Admitting: Pulmonary Disease

## 2024-03-25 LAB — CBC
HCT: 32.7 % — ABNORMAL LOW (ref 39.0–52.0)
Hemoglobin: 10.1 g/dL — ABNORMAL LOW (ref 13.0–17.0)
MCH: 26.6 pg (ref 26.0–34.0)
MCHC: 30.9 g/dL (ref 30.0–36.0)
MCV: 86.3 fL (ref 80.0–100.0)
Platelets: 415 K/uL — ABNORMAL HIGH (ref 150–400)
RBC: 3.79 MIL/uL — ABNORMAL LOW (ref 4.22–5.81)
RDW: 14.7 % (ref 11.5–15.5)
WBC: 20 K/uL — ABNORMAL HIGH (ref 4.0–10.5)
nRBC: 0 % (ref 0.0–0.2)

## 2024-03-25 LAB — BASIC METABOLIC PANEL WITH GFR
Anion gap: 11 (ref 5–15)
BUN: 9 mg/dL (ref 8–23)
CO2: 26 mmol/L (ref 22–32)
Calcium: 7.2 mg/dL — ABNORMAL LOW (ref 8.9–10.3)
Chloride: 98 mmol/L (ref 98–111)
Creatinine, Ser: 0.67 mg/dL (ref 0.61–1.24)
GFR, Estimated: >60 mL/min
Glucose, Bld: 191 mg/dL — ABNORMAL HIGH (ref 70–99)
Potassium: 3.2 mmol/L — ABNORMAL LOW (ref 3.5–5.1)
Sodium: 135 mmol/L (ref 135–145)

## 2024-03-25 MED ORDER — POTASSIUM CHLORIDE CRYS ER 20 MEQ PO TBCR
40.0000 meq | EXTENDED_RELEASE_TABLET | ORAL | Status: AC
  Administered 2024-03-25 (×2): 40 meq via ORAL
  Filled 2024-03-25 (×2): qty 2

## 2024-03-25 MED ORDER — AMOXICILLIN-POT CLAVULANATE 875-125 MG PO TABS
1.0000 | ORAL_TABLET | Freq: Two times a day (BID) | ORAL | 0 refills | Status: DC
  Filled 2024-03-25: qty 56, 28d supply, fill #0

## 2024-03-25 NOTE — Telephone Encounter (Signed)
 Please make hospital follow-up visit in 2 to 4 weeks in nodule clinic

## 2024-03-25 NOTE — Progress Notes (Signed)
 "  NAME:  Timothy Wood, MRN:  [id number], DOB:  Apr 29, 1940, LOS: 2 ADMISSION DATE:  03/23/2024, CONSULTATION DATE:  1/8 REFERRING MD:  Dr. Christobal, CHIEF COMPLAINT:  lung abscess   History of Present Illness:  Patient is a 83 yo M w/ pertinent PMH right lung cancer (diagnosed 2016; followed by Dr. Sherrod), DVT/PE on eliquis , COPD presents to [facility] on 1/8 w/ left lung abscess/pna.  Patient has been having several weeks of increase uine frequency and lower abd discomfort. Also noted to have poor appetite and lost about 20 lbs in the past few weeks. Having a productive cough and left sided chest pain.  He he reports sticky mucus, no foul smell .  No history of dysphagia or aspiration  He complains of shortness of breath after walking a few feet came to [facility] ED on 1/8 for further eval. On arrival afebrile and wbc 16.4. CXR showing consolidation in LUL. CT CAP showing multifocal pna in LUL and LLL; possible lung abscess in posterior LUL 7.2x8.1x9.6 cm. Exp sputum ordered and started on zosyn . PCCM consulted.  He is a smoker about a pack per day, when asked about quitting he states may be 2 weeks after my funeral  Pertinent  Medical History   Lung cancer status postresection 2016 Histoplasmosis, remote history for which he took antifungal for 2 months DVT/PE on Eliquis , last dose 1/8 AM  Past Medical History:  Diagnosis Date   Adenocarcinoma of lung, stage 1 (HCC)    Anemia    Angiodysplasia of cecum 11/23/2017   Multiple 1-2 mm   Arthritis    back possibly   Blood clotting disorder    Cataract    removed bilaterally    Chronic obstructive pulmonary disease (COPD) (HCC)    patient not aware   Clotting disorder    PE LUNG, dvt leg    Colon polyps    Depression    Diverticulosis    DVT (deep venous thrombosis) (HCC)    Post op  knee surgery   DVT (deep venous thrombosis) (HCC)    GERD (gastroesophageal reflux disease)    Histoplasmosis    Histoplasmosis with pneumonia 04/01/2015    Hypercholesteremia    Hyperlipidemia    Hypertension    Laceration of head 08/05/2015   Lumbar compression fracture (HCC)    Lung cancer (HCC) dx'd 2016   Personal history of colonic polyps 2008   multiple adenomas 2008-2009 exams with large cecal adenoma   Pneumonia    in history   Polyposis coli - attenuated 09/12/2010   2008: adenomas with 9 polyps index and 2.5 cm cecal polyp removed over 3 exams, last 2009 with APC. diverticulosis also. 11/24/2010: 12 polyps removed - adenomas Anticipate routine repeat colonoscopy Spring 2013 Have recommended he see genetics counselor re ? Polyposis 03/27/2016 14 polyps removed max 15 mm    Pulmonary nodule 06/2006   9 mm right upper obe pulmonary nodule (negative Pet Scan)   PVD (peripheral vascular disease)    left leg s/p stent   Sebaceous cyst    neck   Seborrheic keratosis    Seizures (HCC)    4-5 yrs ago had seizures when in ICU- none since this time    Sinusitis    Tobacco abuse    Tremor      Significant Hospital Events: Including procedures, antibiotic start and stop dates in addition to other pertinent events   1/8 admit w/ lung abscess  Interim History / Subjective:  No acute events overnight  Objective    Blood pressure 123/73, pulse 81, temperature (!) 97.5 F (36.4 C), temperature source Oral, resp. rate 20, height 5' 8 (1.727 m), weight 62 kg, SpO2 94%.        Intake/Output Summary (Last 24 hours) at 03/25/2024 1330 Last data filed at 03/25/2024 0900 Gross per 24 hour  Intake 450 ml  Output 700 ml  Net -250 ml   Filed Weights   03/23/24 1432  Weight: 62 kg    Examination: Chronically ill-appearing With scattered rhonchi Heart rate regular rate and rhythm Extremities with no edema  Labs/imaging reviewed Potassium 3.2 WBC 20, hemoglobin 10.1, platelets 415  Resolved problem list   Assessment and Plan   Left lung abscess w/ multifocal pna Hx of stage 1a non-small cell lung cancer; adenocarcinoma of  right lung: diagnosed 2016 Hx of COPD Hx of PE/DVT on eliquis  Tobacco abuse: smokes 2 packs/day Plan: Status post bronchoscopy with BAL and brushing yesterday Follow cultures, cytology. Continue Zosyn .  Can switch to Augmentin  for 4 weeks of therapy Will follow-up in clinic with repeat CT scan to reevaluate.  May need navigational bronchoscopy if lesion is not improving. Check ambulatory O2 sats Resume Eliquis  Smoking cessation, nicotine  patch  Signature:   Yobana Culliton MD [facility] See Amion for pager  If no response to pager , please call [phone] until 7pm After 7:00 pm call Elink  [phone] 03/25/2024, 1:30 PM          "

## 2024-03-25 NOTE — Plan of Care (Signed)
   Problem: Education: Goal: Knowledge of General Education information will improve Description: Including pain rating scale, medication(s)/side effects and non-pharmacologic comfort measures Outcome: Adequate for Discharge   Problem: Health Behavior/Discharge Planning: Goal: Ability to manage health-related needs will improve Outcome: Adequate for Discharge   Problem: Clinical Measurements: Goal: Ability to maintain clinical measurements within normal limits will improve Outcome: Adequate for Discharge Goal: Will remain free from infection Outcome: Adequate for Discharge Goal: Diagnostic test results will improve Outcome: Adequate for Discharge Goal: Respiratory complications will improve Outcome: Adequate for Discharge Goal: Cardiovascular complication will be avoided Outcome: Adequate for Discharge   Problem: Activity: Goal: Risk for activity intolerance will decrease Outcome: Adequate for Discharge   Problem: Nutrition: Goal: Adequate nutrition will be maintained Outcome: Adequate for Discharge   Problem: Coping: Goal: Level of anxiety will decrease Outcome: Adequate for Discharge   Problem: Elimination: Goal: Will not experience complications related to bowel motility Outcome: Adequate for Discharge Goal: Will not experience complications related to urinary retention Outcome: Adequate for Discharge   Problem: Pain Managment: Goal: General experience of comfort will improve and/or be controlled Outcome: Adequate for Discharge   Problem: Safety: Goal: Ability to remain free from injury will improve Outcome: Adequate for Discharge   Problem: Skin Integrity: Goal: Risk for impaired skin integrity will decrease Outcome: Adequate for Discharge   Problem: Education: Goal: Knowledge of disease or condition will improve Outcome: Adequate for Discharge Goal: Knowledge of the prescribed therapeutic regimen will improve Outcome: Adequate for Discharge Goal:  Individualized Educational Video(s) Outcome: Adequate for Discharge   Problem: Activity: Goal: Ability to tolerate increased activity will improve Outcome: Adequate for Discharge Goal: Will verbalize the importance of balancing activity with adequate rest periods Outcome: Adequate for Discharge   Problem: Respiratory: Goal: Ability to maintain a clear airway will improve Outcome: Adequate for Discharge Goal: Levels of oxygenation will improve Outcome: Adequate for Discharge Goal: Ability to maintain adequate ventilation will improve Outcome: Adequate for Discharge   Problem: Activity: Goal: Ability to tolerate increased activity will improve Outcome: Adequate for Discharge   Problem: Clinical Measurements: Goal: Ability to maintain a body temperature in the normal range will improve Outcome: Adequate for Discharge   Problem: Respiratory: Goal: Ability to maintain adequate ventilation will improve Outcome: Adequate for Discharge Goal: Ability to maintain a clear airway will improve Outcome: Adequate for Discharge

## 2024-03-25 NOTE — Progress Notes (Signed)
 " PROGRESS NOTE Timothy Wood  FMW:[id number] DOB: May 24, 1940 DOA: 03/23/2024 PCP: Jordan, [name], MD  Brief Narrative/Hospital Course: Timothy Wood is a 84 y.o. male who lives at home by himself, using walker at baseline, and daughter visiting him regularly and with PMH of right lung cancer >5 years ago, DVT PE on Eliquis , HTN, PVD/HLD, COPD,GERD presents to the ED with multiple complaints including several weeks of difficulty with urination with [facility] male increasing frequency, lower abdominal discomfort and also with shortness of breath decreasing appetite. He lost about 20 lb is last 3 weeks. She has productive cough, intermittent chest pain on left Patient otherwise denies any nausea, vomiting, chest pain, fever, chills, headache, focal weakness, numbness tingling, speech difficulties   In the ED: Vitals stable afebrile not hypoxic.  Labs with leukocytosis 10.4, mild hypokalemia anemia UA. CXR: Moderate focal consolidation left upper lobe CT chest abd pelvis w: multifocal pneumonia in the left upper and left lower lobes. Large thick-walled fluid and gas collection in the posterior left upper lobe, measuring 7.2 x 8.1 x 9.6 cm, consistent with a large lung abscess. Of note, the posterior left apical segmental pulmonary artery courses through the medial aspect of the lung abscess.Small left pleural effusion. Right upper lobe tree-in-bud nodules are present, consistent with endobronchial spread of the infection.  Subjective: Seen and examined Patient reports he is doing well.  No nausea vomiting chest pain or shortness of breath Overnight afebrile BP stable on nasal cannula.Labs with mild hypokalemia, worsening leukocytosis 16 >15>20k S/p Bronch 1/9  Assessment and plan:  Left lung abscess Multifocal pneumonia in left upper lobe/lower lobes History of COPD History of stage Ia NSCLC /ACC right lung- 2016 Ongoing tobacco abuse: Appreciate pulmonary input. S/p bronch 1/9 -airway  normal mucopurulent secretion left upper lobe, transbronchial brushing obtained and BAL performed - follow-up  BAL culture sensitivities Leukocytosis worsening postprocedure, but has been afebrile Continue Zosyn , bronchodilator-breztri , prn nebs, pulmonary toileting Encourage smoking cessation. Requesting nicotine  patch He is followed by Dr. Sherrod and reprots after biopsy  they got all cancer out  Hypokalemia: Replace  Mild anemia: Suspect from chronic illness.  Trend  FTT 20 pound weight loss in 2-3 weeks: RD eval. Augment diet  History of pulmonary embolus/DVT x 4: PTA on Eliquis .  Resume anticoagulations post bronchoscopy once cleared by pulmonary-message sent  Essential hypertension: well-controlled. Holding home amlodipine  , continue Coreg   Hyperlipemia Peripheral vascular disease: Continue statin  DVT prophylaxis: lovenox  SCDs Start: 03/23/24 1355Code Status:   Code Status: Limited: Do not attempt resuscitation (DNR) -DNR-LIMITED -Do Not Intubate/DNI  he wishes not to have CPR and intubation and is DNR Family Communication: plan of care discussed with patient at bedside. Patient status is: Remains hospitalized because of severity of illness Level of care: Telemetry   Dispo: The patient is from: home alone, daughter lives close by            Anticipated disposition: TBD Objective: Vitals last 24 hrs: Vitals:   03/24/24 2120 03/24/24 2126 03/25/24 0550 03/25/24 0802  BP:  119/71 123/73   Pulse:  87 74   Resp:  20 20   Temp:  97.6 F (36.4 C) (!) 97.5 F (36.4 C)   TempSrc:  Oral Oral   SpO2: 95% 93% 98% 94%  Weight:      Height:       Physical Examination: General exam: AAOX3 NAD HEENT:Oral mucosa moist, Ear/Nose WNL grossly Respiratory system: Bilaterally clear breath sounds,no use of accessory muscle  Cardiovascular system: S1 & S2 +, No JVD. Gastrointestinal system: Abdomen soft,NT,ND, BS+ Nervous System: Alert, awake, nonfocal Extremities:  extremities warm, leg edema neg Skin: Warm, no rashes MSK: Normal muscle bulk,tone, power   Medications reviewed:  Scheduled Meds:  apixaban   5 mg Oral BID   budesonide -glycopyrrolate -formoterol   2 puff Inhalation BID   carvedilol   6.25 mg Oral BID WC   DULoxetine   60 mg Oral Daily   feeding supplement  237 mL Oral BID BM   folic acid   1 mg Oral Daily   guaiFENesin   600 mg Oral BID   magnesium  oxide  400 mg Oral Daily   nicotine   7 mg Transdermal Daily   pantoprazole   40 mg Oral Daily   potassium chloride   40 mEq Oral Q3H   rosuvastatin   20 mg Oral Daily   thiamine   100 mg Oral Daily   Continuous Infusions:  piperacillin -tazobactam (ZOSYN )  IV 3.375 g (03/25/24 0548)   Diet: Diet Order             Diet regular Room service appropriate? Yes; Fluid consistency: Thin  Diet effective now                   Data Reviewed: I have personally reviewed following labs and imaging studies ( see epic result tab) CBC: Recent Labs  Lab 03/23/24 1044 03/24/24 0724 03/25/24 0620  WBC 16.4* 15.5* 20.0*  NEUTROABS 13.6*  --   --   HGB 11.5* 10.4* 10.1*  HCT 37.5* 33.0* 32.7*  MCV 89.1 86.4 86.3  PLT 441* 413* 415*   CMP: Recent Labs  Lab 03/23/24 1044 03/24/24 0724 03/25/24 0620  NA 139 135 135  K 3.3* 3.1* 3.2*  CL 99 98 98  CO2 29 24 26   GLUCOSE 142* 88 191*  BUN 13 8 9   CREATININE 0.91 0.63 0.67  CALCIUM  8.0* 7.5* 7.2*   GFR: Estimated Creatinine Clearance: 61.4 mL/min (by C-G formula based on SCr of 0.67 mg/dL). Recent Labs  Lab 03/23/24 1044  AST 14*  ALT 11  ALKPHOS 121  BILITOT 0.4  PROT 6.5  ALBUMIN 3.0*    Recent Labs  Lab 03/23/24 1044  LIPASE 39   No results for input(s): AMMONIA in the last 168 hours. Coagulation Profile: No results for input(s): INR, PROTIME in the last 168 hours. Unresulted Labs (From admission, onward)     Start     Ordered   03/26/24 0500  Basic metabolic panel with GFR  Tomorrow morning,   R        03/25/24 1049    03/26/24 0500  CBC  Tomorrow morning,   R        03/25/24 1049   03/24/24 1645  Fungus Culture With Stain  RELEASE UPON ORDERING,   TIMED       Comments: Specimen B: Specimen Description LLL BAL Phone [phone] Immunocompromised?  No  Antibiotic Treatment:  none Is the patient on airborne/droplet precautions? No Clinical History:  N/A Special Instructions:  none Specimen Disposition:  Microbiology/cytology     03/24/24 1645   03/24/24 1645  Acid Fast Culture with reflexed sensitivities  RELEASE UPON ORDERING,   TIMED       Comments: Specimen B: Specimen Description LLL BAL Phone [phone] Immunocompromised?  No  Antibiotic Treatment:  none Is the patient on airborne/droplet precautions? No Clinical History:  N/A Special Instructions:  none Specimen Disposition:  Microbiology/cytology     03/24/24 1645   03/24/24 1645  Acid Fast Smear (AFB)  RELEASE UPON ORDERING,   TIMED       Comments: Specimen B: Specimen Description LLL BAL Phone [phone] Immunocompromised?  No  Antibiotic Treatment:  none Is the patient on airborne/droplet precautions? No Clinical History:  N/A Special Instructions:  none Specimen Disposition:  Microbiology/cytology     03/24/24 1645   03/24/24 1643  Acid Fast Culture with reflexed sensitivities  RELEASE UPON ORDERING,   TIMED       Comments: Specimen A: Specimen Description LUL BAL Phone [phone] Immunocompromised?  No  Antibiotic Treatment:  none Is the patient on airborne/droplet precautions? No Clinical History:  N/A Special Instructions:  none Specimen Disposition:  Microbiology/cytology     03/24/24 1643   03/24/24 1643  Acid Fast Smear (AFB)  RELEASE UPON ORDERING,   TIMED       Comments: Specimen A: Specimen Description LUL BAL Phone [phone] Immunocompromised?  No  Antibiotic Treatment:  none Is the patient on airborne/droplet precautions? No Clinical History:  N/A Special Instructions:  none Specimen Disposition:   Microbiology/cytology     03/24/24 1643   03/24/24 1643  Fungus Culture With Stain  RELEASE UPON ORDERING,   TIMED       Comments: Specimen A: Specimen Description LUL BAL Phone [phone] Immunocompromised?  No  Antibiotic Treatment:  none Is the patient on airborne/droplet precautions? No Clinical History:  N/A Special Instructions:  none Specimen Disposition:  Microbiology/cytology     03/24/24 1643   03/24/24 1643  Culture, BAL-quantitative w Gram Stain  RELEASE UPON ORDERING,   TIMED       Comments: Specimen A: Specimen Description LUL BAL Phone [phone] Immunocompromised?  No  Antibiotic Treatment:  none Is the patient on airborne/droplet precautions? No Clinical History:  N/A Special Instructions:  none Specimen Disposition:  Microbiology/cytology     03/24/24 1643   03/23/24 1355  Expectorated Sputum Assessment w Gram Stain, Rflx to Resp Cult  (COPD / Pneumonia / Cellulitis / Lower Extremity Wound (Diabetic Foot Infection))  Once,   R        03/23/24 1355           Antimicrobials/Microbiology: Anti-infectives (From admission, onward)    Start     Dose/Rate Route Frequency Ordered Stop   03/23/24 2100  piperacillin -tazobactam (ZOSYN ) IVPB 3.375 g        3.375 g 12.5 mL/hr over 240 Minutes Intravenous Every 8 hours 03/23/24 1349     03/23/24 1300  cefTRIAXone  (ROCEPHIN ) 1 g in sodium chloride  0.9 % 100 mL IVPB  Status:  Discontinued        1 g 200 mL/hr over 30 Minutes Intravenous  Once 03/23/24 1257 03/23/24 1259   03/23/24 1300  azithromycin  (ZITHROMAX ) 500 mg in sodium chloride  0.9 % 250 mL IVPB  Status:  Discontinued        500 mg 250 mL/hr over 60 Minutes Intravenous  Once 03/23/24 1257 03/23/24 1259   03/23/24 1300  piperacillin -tazobactam (ZOSYN ) IVPB 3.375 g        3.375 g 100 mL/hr over 30 Minutes Intravenous  Once 03/23/24 1259 03/23/24 1337         Component Value Date/Time   SDES  03/24/2024 1643    BRONCHIAL ALVEOLAR LAVAGE Performed at  [facility], [address], [place], [place] [address]    SPECREQUEST  03/24/2024 1643    NONE Performed at [facility], [address], [place], [place] [address]  CULT PENDING 03/24/2024 1643   REPTSTATUS PENDING 03/24/2024 1643    Procedures: Procedures: BRONCHOSCOPY, WITH FLUOROSCOPY IRRIGATION, BRONCHUS BRONCHOSCOPY, WITH BRUSH BIOPSY   Mennie LAMY, MD [facility] 03/25/2024, 10:50 AM   "

## 2024-03-25 NOTE — Plan of Care (Signed)
" °  Problem: Health Behavior/Discharge Planning: Goal: Ability to manage health-related needs will improve Outcome: Progressing   Problem: Clinical Measurements: Goal: Diagnostic test results will improve Outcome: Progressing Goal: Respiratory complications will improve Outcome: Progressing   Problem: Nutrition: Goal: Adequate nutrition will be maintained Outcome: Progressing   Problem: Coping: Goal: Level of anxiety will decrease Outcome: Progressing   Problem: Elimination: Goal: Will not experience complications related to bowel motility Outcome: Progressing   "

## 2024-03-25 NOTE — Discharge Summary (Signed)
 SABRA

## 2024-03-25 NOTE — Progress Notes (Signed)
 Discharge med in  a secure bag delivered to patient by this RN

## 2024-03-26 ENCOUNTER — Encounter (HOSPITAL_COMMUNITY): Payer: Self-pay | Admitting: Pulmonary Disease

## 2024-03-26 LAB — ACID FAST SMEAR (AFB, MYCOBACTERIA)
Acid Fast Smear: NEGATIVE
Acid Fast Smear: NEGATIVE

## 2024-03-26 LAB — CULTURE, BAL-QUANTITATIVE W GRAM STAIN
Culture: NO GROWTH
Gram Stain: NONE SEEN

## 2024-03-27 LAB — CULTURE, BAL-QUANTITATIVE W GRAM STAIN
Culture: NO GROWTH
Gram Stain: NONE SEEN

## 2024-03-28 LAB — FUNGUS CULTURE WITH STAIN

## 2024-03-28 LAB — CYTOLOGY - NON PAP

## 2024-03-28 LAB — FUNGUS CULTURE RESULT

## 2024-03-28 NOTE — Telephone Encounter (Signed)
 Done

## 2024-03-29 LAB — FUNGUS CULTURE RESULT

## 2024-03-29 LAB — FUNGUS CULTURE WITH STAIN

## 2024-04-04 LAB — AEROBIC/ANAEROBIC CULTURE W GRAM STAIN (SURGICAL/DEEP WOUND): Culture: NO GROWTH

## 2024-04-07 ENCOUNTER — Ambulatory Visit: Payer: Self-pay

## 2024-04-07 ENCOUNTER — Telehealth: Payer: Self-pay | Admitting: Family Medicine

## 2024-04-07 NOTE — Telephone Encounter (Signed)
 Copied from CRM [id number]. Topic: Referral - Question >> Apr 07, 2024  2:59 PM Antwanette L wrote: Reason for CRM: Bernarda from [facility] called regarding the pt request for a home health nurse referral through [facility]. During, the call Bernarda had the pt daughterETTER Browning) on hold and informed her that a hospital follow is needed if the referral has not been discussed w/ the provider. The pt was discharged on 1/10 from [facility] pt (Michelle)daughter stated that the pt is unable to come in for an appt. The pt daughter is asking whether the referral can still be submitted w/o the appt. Please follow up with Browning at [phone]

## 2024-04-07 NOTE — Telephone Encounter (Signed)
 See prior tel encounter for same concern. Thanks, BJ

## 2024-04-07 NOTE — Telephone Encounter (Signed)
 He was just discharged from the hospital, 03/26/24. I have not seen him since 05/2023. If he feels like symptoms are worse since hospital discharge he really needs to go back to the ED for evaluation. Thanks, BJ

## 2024-04-07 NOTE — Telephone Encounter (Signed)
 FYI Only or Action Required?: Action required by provider: referral request, clinical question for provider, and update on patient condition.  Patient was last seen in primary care on 06/01/2023 by Jordan, [name], MD.  Called Nurse Triage reporting Pneumonia.  Symptoms began several weeks ago.  Interventions attempted: Prescription medications: augmentin , Rest, hydration, or home remedies, and Other: hospital admission 1/8-1/10.  Symptoms are: rapidly worsening.  Triage Disposition: Call EMS 911 Now  Patient/caregiver understands and will follow disposition?: No, wishes to speak with PCP       Reason for Disposition  Bluish (or gray) lips or face now  Answer Assessment - Initial Assessment Questions This RN recommended pt be examined in hospital via EMS, offered to call ambulance for family, pt daughter frustrated that pt was discharged from hospital, requesting assistance with getting home health care for pt, refusing ambulance at this time. Pt daughter states it's not that I'm not wanting to call 911 but concerned that he was just there and was let go before he was ready, pt daughter states she will assess pt and will call back if need be. Pt daughter requesting word back from Dr. Jordan about home health care. Advised pt call 911 or get to hospital asap if any new or worsening symptoms. Sending message to PCP office for call back to pt with further recommendations. Alerted [facility] to situation.      3. BETTER-SAME-WORSE: Are you getting better, staying the same, or getting worse compared to the day you were discharged?     Progressively worse  4. HOSPITALIZATION: How long were you hospitalized? (e.g., days)     1/8-1/10  5. DISCHARGE DATE: What date were you discharged from the hospital?     1/10  9. DISCHARGE ANTIBIOTICS: Are you taking any antibiotic medicine now?      4 weeks supply augmentin  currently taking  10. BREATHING DIFFICULTY: Are you having any  difficulty breathing? If Yes, ask: How bad is it?  (e.g., none, mild, moderate, severe)        Not sure if worse than usual SOB but is SOB, has been for quite some time, especially when any type of exertion  12. OTHER SYMPTOMS: Do you have any other symptoms? (e.g., weakness, confusion, pain)       Not eating very much, lack of appetite Much weaker than usual, can barely stand on his own, using a walker Dizziness His face is definitely pale, the pallor is not good, almost gray but not blue, his lips have very little color, his skin tone is very pale  Denies Severe SOB  No further triage due to need for 911/ED  Wanting home health care services, discussed with [employer], need referral from PCP, [employer] reached out in last 30 min, daughter following up to see if any word back This RN informed pt daughter that Dr. Jordan has not been able to respond to request yet  Protocols used: Pneumonia on Antibiotic Post-Hospitalization Follow-up Call-[facility]

## 2024-04-07 NOTE — Telephone Encounter (Signed)
 Patients daughter was informed of Dr. Candee medical advise. She voiced understanding and did not have any further questions at this time.

## 2024-04-07 NOTE — Telephone Encounter (Signed)
 Patients daughter has been informed and voiced understanding. Patient daughter did not have any questions or concerns at this time.

## 2024-04-08 ENCOUNTER — Encounter (HOSPITAL_COMMUNITY): Payer: Self-pay | Admitting: *Deleted

## 2024-04-08 ENCOUNTER — Inpatient Hospital Stay (HOSPITAL_COMMUNITY)

## 2024-04-08 ENCOUNTER — Other Ambulatory Visit: Payer: Self-pay

## 2024-04-08 ENCOUNTER — Inpatient Hospital Stay (HOSPITAL_COMMUNITY)
Admission: EM | Admit: 2024-04-08 | Discharge: 2024-04-14 | DRG: 178 | Disposition: A | Discharge status: Skilled Nursing Facility | Attending: Family Medicine | Admitting: Family Medicine

## 2024-04-08 ENCOUNTER — Emergency Department (HOSPITAL_COMMUNITY)

## 2024-04-08 DIAGNOSIS — Z86711 Personal history of pulmonary embolism: Secondary | ICD-10-CM

## 2024-04-08 DIAGNOSIS — E441 Mild protein-calorie malnutrition: Secondary | ICD-10-CM | POA: Diagnosis present

## 2024-04-08 DIAGNOSIS — J449 Chronic obstructive pulmonary disease, unspecified: Secondary | ICD-10-CM | POA: Diagnosis present

## 2024-04-08 DIAGNOSIS — R5383 Other fatigue: Secondary | ICD-10-CM | POA: Diagnosis not present

## 2024-04-08 DIAGNOSIS — Z955 Presence of coronary angioplasty implant and graft: Secondary | ICD-10-CM

## 2024-04-08 DIAGNOSIS — I2699 Other pulmonary embolism without acute cor pulmonale: Secondary | ICD-10-CM | POA: Diagnosis not present

## 2024-04-08 DIAGNOSIS — J852 Abscess of lung without pneumonia: Secondary | ICD-10-CM | POA: Diagnosis not present

## 2024-04-08 DIAGNOSIS — J439 Emphysema, unspecified: Secondary | ICD-10-CM | POA: Diagnosis present

## 2024-04-08 DIAGNOSIS — I10 Essential (primary) hypertension: Secondary | ICD-10-CM | POA: Diagnosis not present

## 2024-04-08 DIAGNOSIS — J851 Abscess of lung with pneumonia: Secondary | ICD-10-CM | POA: Diagnosis not present

## 2024-04-08 DIAGNOSIS — Z85118 Personal history of other malignant neoplasm of bronchus and lung: Secondary | ICD-10-CM | POA: Diagnosis not present

## 2024-04-08 DIAGNOSIS — Z83719 Family history of colon polyps, unspecified: Secondary | ICD-10-CM

## 2024-04-08 DIAGNOSIS — Z86718 Personal history of other venous thrombosis and embolism: Secondary | ICD-10-CM

## 2024-04-08 DIAGNOSIS — L89316 Pressure-induced deep tissue damage of right buttock: Secondary | ICD-10-CM | POA: Diagnosis present

## 2024-04-08 DIAGNOSIS — Z885 Allergy status to narcotic agent status: Secondary | ICD-10-CM

## 2024-04-08 DIAGNOSIS — D72829 Elevated white blood cell count, unspecified: Secondary | ICD-10-CM | POA: Diagnosis not present

## 2024-04-08 DIAGNOSIS — J440 Chronic obstructive pulmonary disease with acute lower respiratory infection: Secondary | ICD-10-CM | POA: Diagnosis present

## 2024-04-08 DIAGNOSIS — Z79899 Other long term (current) drug therapy: Secondary | ICD-10-CM

## 2024-04-08 DIAGNOSIS — Z7901 Long term (current) use of anticoagulants: Secondary | ICD-10-CM

## 2024-04-08 DIAGNOSIS — Z792 Long term (current) use of antibiotics: Secondary | ICD-10-CM

## 2024-04-08 DIAGNOSIS — L89326 Pressure-induced deep tissue damage of left buttock: Secondary | ICD-10-CM | POA: Diagnosis present

## 2024-04-08 DIAGNOSIS — I2510 Atherosclerotic heart disease of native coronary artery without angina pectoris: Secondary | ICD-10-CM | POA: Diagnosis present

## 2024-04-08 DIAGNOSIS — E782 Mixed hyperlipidemia: Secondary | ICD-10-CM | POA: Diagnosis present

## 2024-04-08 DIAGNOSIS — Z6820 Body mass index (BMI) 20.0-20.9, adult: Secondary | ICD-10-CM

## 2024-04-08 DIAGNOSIS — F17210 Nicotine dependence, cigarettes, uncomplicated: Secondary | ICD-10-CM | POA: Diagnosis present

## 2024-04-08 DIAGNOSIS — Z860101 Personal history of adenomatous and serrated colon polyps: Secondary | ICD-10-CM

## 2024-04-08 DIAGNOSIS — Z902 Acquired absence of lung [part of]: Secondary | ICD-10-CM | POA: Diagnosis not present

## 2024-04-08 DIAGNOSIS — I739 Peripheral vascular disease, unspecified: Secondary | ICD-10-CM | POA: Diagnosis present

## 2024-04-08 DIAGNOSIS — Z66 Do not resuscitate: Secondary | ICD-10-CM | POA: Diagnosis present

## 2024-04-08 DIAGNOSIS — Z8249 Family history of ischemic heart disease and other diseases of the circulatory system: Secondary | ICD-10-CM

## 2024-04-08 DIAGNOSIS — F324 Major depressive disorder, single episode, in partial remission: Secondary | ICD-10-CM | POA: Diagnosis present

## 2024-04-08 DIAGNOSIS — L8990 Pressure ulcer of unspecified site, unspecified stage: Secondary | ICD-10-CM

## 2024-04-08 DIAGNOSIS — L89159 Pressure ulcer of sacral region, unspecified stage: Secondary | ICD-10-CM | POA: Diagnosis not present

## 2024-04-08 DIAGNOSIS — R739 Hyperglycemia, unspecified: Secondary | ICD-10-CM | POA: Diagnosis present

## 2024-04-08 DIAGNOSIS — L89150 Pressure ulcer of sacral region, unstageable: Secondary | ICD-10-CM | POA: Diagnosis present

## 2024-04-08 DIAGNOSIS — Z8619 Personal history of other infectious and parasitic diseases: Secondary | ICD-10-CM | POA: Diagnosis not present

## 2024-04-08 DIAGNOSIS — Z8679 Personal history of other diseases of the circulatory system: Secondary | ICD-10-CM

## 2024-04-08 LAB — CBC WITH DIFFERENTIAL/PLATELET
Abs Immature Granulocytes: 0.16 10*3/uL — ABNORMAL HIGH (ref 0.00–0.07)
Basophils Absolute: 0.1 10*3/uL (ref 0.0–0.1)
Basophils Relative: 0 %
Eosinophils Absolute: 0.1 10*3/uL (ref 0.0–0.5)
Eosinophils Relative: 0 %
HCT: 33.4 % — ABNORMAL LOW (ref 39.0–52.0)
Hemoglobin: 10.3 g/dL — ABNORMAL LOW (ref 13.0–17.0)
Immature Granulocytes: 1 %
Lymphocytes Relative: 4 %
Lymphs Abs: 0.9 10*3/uL (ref 0.7–4.0)
MCH: 27 pg (ref 26.0–34.0)
MCHC: 30.8 g/dL (ref 30.0–36.0)
MCV: 87.4 fL (ref 80.0–100.0)
Monocytes Absolute: 1.3 10*3/uL — ABNORMAL HIGH (ref 0.1–1.0)
Monocytes Relative: 6 %
Neutro Abs: 18.2 10*3/uL — ABNORMAL HIGH (ref 1.7–7.7)
Neutrophils Relative %: 89 %
Platelets: 462 10*3/uL — ABNORMAL HIGH (ref 150–400)
RBC: 3.82 MIL/uL — ABNORMAL LOW (ref 4.22–5.81)
RDW: 15.3 % (ref 11.5–15.5)
WBC: 20.7 10*3/uL — ABNORMAL HIGH (ref 4.0–10.5)
nRBC: 0 % (ref 0.0–0.2)

## 2024-04-08 LAB — I-STAT CG4 LACTIC ACID, ED: Lactic Acid, Venous: 1.5 mmol/L (ref 0.5–1.9)

## 2024-04-08 LAB — COMPREHENSIVE METABOLIC PANEL WITH GFR
ALT: 31 U/L (ref 0–44)
AST: 25 U/L (ref 15–41)
Albumin: 2.9 g/dL — ABNORMAL LOW (ref 3.5–5.0)
Alkaline Phosphatase: 168 U/L — ABNORMAL HIGH (ref 38–126)
Anion gap: 11 (ref 5–15)
BUN: 11 mg/dL (ref 8–23)
CO2: 25 mmol/L (ref 22–32)
Calcium: 8.6 mg/dL — ABNORMAL LOW (ref 8.9–10.3)
Chloride: 98 mmol/L (ref 98–111)
Creatinine, Ser: 0.6 mg/dL — ABNORMAL LOW (ref 0.61–1.24)
GFR, Estimated: >60 mL/min
Glucose, Bld: 173 mg/dL — ABNORMAL HIGH (ref 70–99)
Potassium: 3.5 mmol/L (ref 3.5–5.1)
Sodium: 134 mmol/L — ABNORMAL LOW (ref 135–145)
Total Bilirubin: 0.3 mg/dL (ref 0.0–1.2)
Total Protein: 6.1 g/dL — ABNORMAL LOW (ref 6.5–8.1)

## 2024-04-08 LAB — CBG MONITORING, ED: Glucose-Capillary: 171 mg/dL — ABNORMAL HIGH (ref 70–99)

## 2024-04-08 MED ORDER — MAGNESIUM OXIDE -MG SUPPLEMENT 400 (240 MG) MG PO TABS
400.0000 mg | ORAL_TABLET | Freq: Every day | ORAL | Status: DC
  Administered 2024-04-08 – 2024-04-14 (×7): 400 mg via ORAL
  Filled 2024-04-08 (×11): qty 1

## 2024-04-08 MED ORDER — ONDANSETRON HCL 4 MG PO TABS: 4.0000 mg | ORAL_TABLET | Freq: Four times a day (QID) | ORAL | Status: DC | PRN

## 2024-04-08 MED ORDER — PRESERVISION AREDS 2 PO CAPS: 1.0000 | ORAL_CAPSULE | Freq: Every day | ORAL | Status: DC

## 2024-04-08 MED ORDER — PIPERACILLIN-TAZOBACTAM 3.375 G IVPB 30 MIN
3.3750 g | Freq: Once | INTRAVENOUS | Status: AC
  Administered 2024-04-08: 3.375 g via INTRAVENOUS
  Filled 2024-04-08: qty 50

## 2024-04-08 MED ORDER — APIXABAN 5 MG PO TABS
5.0000 mg | ORAL_TABLET | Freq: Two times a day (BID) | ORAL | Status: DC
  Administered 2024-04-08 – 2024-04-14 (×12): 5 mg via ORAL
  Filled 2024-04-08 (×12): qty 1

## 2024-04-08 MED ORDER — CARVEDILOL 6.25 MG PO TABS
6.2500 mg | ORAL_TABLET | Freq: Two times a day (BID) | ORAL | Status: DC
  Administered 2024-04-08 – 2024-04-14 (×10): 6.25 mg via ORAL
  Filled 2024-04-08 (×10): qty 1

## 2024-04-08 MED ORDER — PROSIGHT PO TABS
1.0000 | ORAL_TABLET | Freq: Every day | ORAL | Status: DC
  Administered 2024-04-09 – 2024-04-14 (×6): 1 via ORAL
  Filled 2024-04-08 (×6): qty 1

## 2024-04-08 MED ORDER — FOLIC ACID 1 MG PO TABS
1.0000 mg | ORAL_TABLET | Freq: Every day | ORAL | Status: DC
  Administered 2024-04-08 – 2024-04-14 (×7): 1 mg via ORAL
  Filled 2024-04-08 (×7): qty 1

## 2024-04-08 MED ORDER — IRBESARTAN 150 MG PO TABS
150.0000 mg | ORAL_TABLET | Freq: Every day | ORAL | Status: DC
  Administered 2024-04-09 – 2024-04-14 (×4): 150 mg via ORAL
  Filled 2024-04-08 (×5): qty 1

## 2024-04-08 MED ORDER — ACETAMINOPHEN 325 MG PO TABS
650.0000 mg | ORAL_TABLET | Freq: Four times a day (QID) | ORAL | Status: DC | PRN
  Administered 2024-04-13: 650 mg via ORAL
  Filled 2024-04-08: qty 2

## 2024-04-08 MED ORDER — THIAMINE MONONITRATE 100 MG PO TABS
100.0000 mg | ORAL_TABLET | Freq: Every day | ORAL | Status: DC
  Administered 2024-04-08 – 2024-04-14 (×7): 100 mg via ORAL
  Filled 2024-04-08 (×11): qty 1

## 2024-04-08 MED ORDER — ONDANSETRON HCL 4 MG/2ML IJ SOLN: 4.0000 mg | Freq: Four times a day (QID) | INTRAMUSCULAR | Status: DC | PRN

## 2024-04-08 MED ORDER — SODIUM CHLORIDE 0.9 % IV BOLUS
1000.0000 mL | Freq: Once | INTRAVENOUS | Status: AC
  Administered 2024-04-08: 1000 mL via INTRAVENOUS

## 2024-04-08 MED ORDER — PIPERACILLIN-TAZOBACTAM 3.375 G IVPB
3.3750 g | Freq: Three times a day (TID) | INTRAVENOUS | Status: DC
  Administered 2024-04-08 – 2024-04-10 (×5): 3.375 g via INTRAVENOUS
  Filled 2024-04-08 (×5): qty 50

## 2024-04-08 MED ORDER — PANTOPRAZOLE SODIUM 40 MG PO TBEC
40.0000 mg | DELAYED_RELEASE_TABLET | Freq: Every day | ORAL | Status: DC
  Administered 2024-04-09 – 2024-04-14 (×6): 40 mg via ORAL
  Filled 2024-04-08 (×6): qty 1

## 2024-04-08 MED ORDER — ALBUTEROL SULFATE (2.5 MG/3ML) 0.083% IN NEBU: 2.5000 mg | INHALATION_SOLUTION | RESPIRATORY_TRACT | Status: DC | PRN

## 2024-04-08 MED ORDER — DULOXETINE HCL 60 MG PO CPEP
60.0000 mg | ORAL_CAPSULE | Freq: Every day | ORAL | Status: DC
  Administered 2024-04-09 – 2024-04-14 (×6): 60 mg via ORAL
  Filled 2024-04-08 (×6): qty 1

## 2024-04-08 MED ORDER — ROSUVASTATIN CALCIUM 20 MG PO TABS
20.0000 mg | ORAL_TABLET | Freq: Every day | ORAL | Status: DC
  Administered 2024-04-08 – 2024-04-14 (×7): 20 mg via ORAL
  Filled 2024-04-08 (×7): qty 1

## 2024-04-08 MED ORDER — MIRTAZAPINE 15 MG PO TABS
7.5000 mg | ORAL_TABLET | Freq: Every day | ORAL | Status: DC
  Administered 2024-04-08 – 2024-04-13 (×6): 7.5 mg via ORAL
  Filled 2024-04-08 (×6): qty 1

## 2024-04-08 MED ORDER — ACETAMINOPHEN 650 MG RE SUPP: 650.0000 mg | Freq: Four times a day (QID) | RECTAL | Status: DC | PRN

## 2024-04-08 MED ORDER — AMLODIPINE BESYLATE 5 MG PO TABS
5.0000 mg | ORAL_TABLET | Freq: Every day | ORAL | Status: DC
  Administered 2024-04-09 – 2024-04-14 (×4): 5 mg via ORAL
  Filled 2024-04-08 (×5): qty 1

## 2024-04-08 NOTE — H&P (Signed)
 " History and Physical    Patient: Timothy Wood FMW:[id number] DOB: [birth year] DOA: 04/08/2024 DOS: the patient was seen and examined on 04/08/2024 PCP: Jordan, [name], MD  Patient coming from: Home  Chief Complaint:  Chief Complaint  Patient presents with   Shortness of Breath   Weakness   HPI: Timothy Wood is a 84 y.o. male with medical history significant of stage I lung adenocarcinoma, C, nodular dysplasia, osteoarthritis, blood clotting disorder, cataract, history of DVT, peripheral vascular disease, depression, diverticulosis, GERD,  hyperlipidemia hypertension, lumbar compression fracture, seizures while in ICU years ago without further episodes since then, sinusitis, pulmonary nodule, histoplasmosis with pneumonia, COPD, history of pneumonia who was recently admitted and discharged for 3 days earlier this month due to lung abscess who is coming to the emergency department due to generalized weakness, fatigue and dyspnea.  He has been having significant difficulty just trying to get out of bed.  He was initially treated with Zosyn  and has been taking Augmentin  at home.  No significant change in cough, he denied fever, chills, rhinorrhea, sore throat, wheezing or hemoptysis.  No chest pain, palpitations, diaphoresis, PND, orthopnea or pitting edema of the lower extremities.  No abdominal pain, nausea, emesis, diarrhea, constipation, melena or hematochezia.  No flank pain, dysuria, frequency or hematuria.  No polyuria, polydipsia, polyphagia or blurred vision.   Lab work: CBC sure white count 20.7, hemoglobin 10.3 g/dL and platelets 537.  Lactic acid was normal.  CMP showed normal electrolytes after sodium and calcium  correction, normal renal function, total protein 6.1 and albumin 2.9 g/dL, transaminases and total bilirubin were normal.  Imaging: Portable 1 view chest radiograph showing mild improvement in his left lung airspace disease, with persistent dense consolidation in the  central left upper lobe.  Cannot exclude centrally obstructing left hilar mass or lymphadenopathy.  CT chest without contrast showed increasing airspace disease and consolidation in the left upper lobes and lower lobes.  Abscess is not well-delineated on noncontrast exam.  Small left pleural effusion.  Emphysema.  Aortic atherosclerosis.  Coronary artery calcifications.  ED course: Initial vital signs were temperature 97.5 F, pulse 71, respiration 22, BP 109/68 mmHg and O2 sat 93% on room air.  The patient received 1000 mL of normal saline bolus and 3.375 g of Zosyn  IVPB.   Review of Systems: As mentioned in the history of present illness. All other systems reviewed and are negative. Past Medical History:  Diagnosis Date   Adenocarcinoma of lung, stage 1 (HCC)    Anemia    Angiodysplasia of cecum 11/23/2017   Multiple 1-2 mm   Arthritis    back possibly   Blood clotting disorder    Cataract    removed bilaterally    Chronic obstructive pulmonary disease (COPD) (HCC)    patient not aware   Clotting disorder    PE LUNG, dvt leg    Colon polyps    Depression    Diverticulosis    DVT (deep venous thrombosis) (HCC)    Post op  knee surgery   DVT (deep venous thrombosis) (HCC)    GERD (gastroesophageal reflux disease)    Histoplasmosis    Histoplasmosis with pneumonia 04/01/2015   Hypercholesteremia    Hyperlipidemia    Hypertension    Laceration of head 08/05/2015   Lumbar compression fracture (HCC)    Lung cancer (HCC) dx'd 2016   Personal history of colonic polyps 2008   multiple adenomas 2008-2009 exams with large cecal adenoma  Pneumonia    in history   Polyposis coli - attenuated 09/12/2010   2008: adenomas with 9 polyps index and 2.5 cm cecal polyp removed over 3 exams, last 2009 with APC. diverticulosis also. 11/24/2010: 12 polyps removed - adenomas Anticipate routine repeat colonoscopy Spring 2013 Have recommended he see genetics counselor re ? Polyposis 03/27/2016 14 polyps  removed max 15 mm    Pulmonary nodule 06/2006   9 mm right upper obe pulmonary nodule (negative Pet Scan)   PVD (peripheral vascular disease)    left leg s/p stent   Sebaceous cyst    neck   Seborrheic keratosis    Seizures (HCC)    4-5 yrs ago had seizures when in ICU- none since this time    Sinusitis    Tobacco abuse    Tremor    Past Surgical History:  Procedure Laterality Date   ANGIOPLASTY / STENTING ILIAC  08/2008   Dr. Serene SANES SURGERY  1975, 1978   x2 lumbar fusion over 30 years ago   BACK SURGERY     BRONCHIAL BRUSHINGS  03/24/2024   Procedure: BRONCHOSCOPY, WITH BRUSH BIOPSY;  Surgeon: Mannam, Praveen, MD;  Location: [facility];  Service: Cardiopulmonary;;   BRONCHIAL WASHINGS  03/24/2024   Procedure: IRRIGATION, BRONCHUS;  Surgeon: Mannam, Praveen, MD;  Location: [facility];  Service: Cardiopulmonary;;   COLONOSCOPY  05/11/2006; 07/27/2006;05/05/2007, 11/24/2010   adenomas with 9 polyps index and 2.5 cm cecal polyp removed over 3 exams, last 2009 with APC. diverticulosis also.  2012: 12 adenomas removed, largest 8-10 mm, diverticulosis   CORONARY ANGIOPLASTY WITH STENT PLACEMENT     DESCENDING AORTIC ANEURYSM REPAIR W/ STENT  04/2010   Dr. Serene   ESOPHAGOGASTRODUODENOSCOPY  05/05/2007   GERD - one erosion   EYE SURGERY Bilateral    cataract   KNEE SURGERY  2002   left- cartilage    LUNG BIOPSY     PERCUTANEOUS PINNING Left 06/24/2012   Procedure: Repair Complex Laceration Left Thumb with percutaneous pinning.;  Surgeon: Balinda JAYSON Rogue, MD;  Location: [facility] OR;  Service: Plastics;  Laterality: Left;   THUMB AMPUTATION Left    pinning and index finger   TONSILLECTOMY  1956   TONSILLECTOMY     UPPER GASTROINTESTINAL ENDOSCOPY     VIDEO ASSISTED THORACOSCOPY (VATS)/WEDGE RESECTION Right 01/24/2015   Procedure: RIGHT VIDEO ASSISTED THORACOSCOPY (VATS)/WEDGE RESECTION;  Surgeon: Elspeth JAYSON Millers, MD;  Location: [facility] OR;  Service: Thoracic;  Laterality: Right;    VIDEO BRONCHOSCOPY  03/24/2024   Procedure: BRONCHOSCOPY, WITH FLUOROSCOPY;  Surgeon: Mannam, Praveen, MD;  Location: [facility];  Service: Cardiopulmonary;;   vocal cord polyps removed     Social History:  reports that he has been smoking cigarettes. He has a 60 pack-year smoking history. He has never used smokeless tobacco. He reports current alcohol use. He reports that he does not use drugs.  Allergies[1]  Family History  Problem Relation Age of Onset   Colon cancer Mother 31       multiple colon polyps   Deep vein thrombosis Mother    Cancer Mother 71       uterine cancer   Uterine cancer Mother    Lung cancer Mother    Lung cancer Son        smoked and was a welder   Heart disease Brother    Diabetes Other    Heart disease Brother    Rheum arthritis Daughter    Rheum arthritis  Sister    Colon polyps Neg Hx    Stomach cancer Neg Hx    Rectal cancer Neg Hx     Prior to Admission medications  Medication Sig Start Date End Date Taking? Authorizing Provider  Acetaminophen  500 MG capsule Take 1,000 mg by mouth daily as needed for fever.    [provider]  amLODipine  (NORVASC ) 5 MG tablet Take 1 tablet (5 mg total) by mouth daily. 06/01/23   Jordan, [name], MD  amoxicillin -clavulanate (AUGMENTIN ) 875-125 MG tablet Take 1 tablet by mouth 2 (two) times daily for 28 days. 03/25/24 04/22/24  Christobal Guadalajara, MD  apixaban  (ELIQUIS ) 5 MG TABS tablet Take 1 tablet (5 mg total) by mouth 2 (two) times daily. 06/01/23   Jordan, [name], MD  carvedilol  (COREG ) 6.25 MG tablet Take 1 tablet (6.25 mg total) by mouth 2 (two) times daily with a meal. 11/04/23   Jordan, [name], MD  DULoxetine  (CYMBALTA ) 60 MG capsule Take 1 capsule (60 mg total) by mouth daily. 04/14/23   Jordan, [name], MD  EPINEPHrine  (EPIPEN  2-PAK) 0.3 mg/0.3 mL IJ SOAJ injection To use daily if needed for severe allergy reaction. Patient taking differently: Inject 0.3 mg into the muscle as needed for anaphylaxis. To use  daily if needed for severe allergy reaction. 08/20/21   Jordan, [name], MD  folic acid  (FOLVITE ) 1 MG tablet TAKE 1 TABLET BY MOUTH DAILY Patient not taking: Reported on 03/23/2024 05/24/23   Jordan, [name], MD  irbesartan  (AVAPRO ) 150 MG tablet Take 1 tablet (150 mg total) by mouth daily. 06/01/23   Jordan, [name], MD  magnesium  oxide (MAG-OX) 400 MG tablet Take 1 tablet (400 mg total) by mouth daily. 08/31/22   Jordan, [name], MD  omeprazole  (PRILOSEC) 20 MG capsule TAKE 1 CAPSULE BY MOUTH TWICE A DAY BEFORE MEALS Patient taking differently: Take 20 mg by mouth 2 (two) times daily before a meal. TAKE 1 CAPSULE BY MOUTH TWICE A DAY BEFORE MEALS 01/18/23   Jordan, [name], MD  potassium chloride  (KLOR-CON ) 10 MEQ tablet Take 1 tablet (10 mEq total) by mouth daily as needed (with lasix ). 06/01/23 03/23/24  Jordan, [name], MD  rosuvastatin  (CRESTOR ) 20 MG tablet Take 1 tablet (20 mg total) by mouth daily. 06/01/23   Jordan, [name], MD  thiamine  (VITAMIN B1) 100 MG tablet Take 1 tablet (100 mg total) by mouth daily. 06/01/23   Jordan, [name], MD    Physical Exam: Vitals:   04/08/24 1046 04/08/24 1048 04/08/24 1100 04/08/24 1249  BP: (!) 94/58  (!) 95/55 104/61  Pulse: 94  93 84  Resp: (!) 22  18 18   Temp: (!) 97.5 F (36.4 C)   97.6 F (36.4 C)  TempSrc: Oral   Oral  SpO2: 92%  96% 96%  Weight:  62 kg    Height:  5' 8 (1.727 m)     Physical Exam Vitals and nursing note reviewed.  Constitutional:      Appearance: He is ill-appearing.  HENT:     Head: Normocephalic.     Nose: No rhinorrhea.  Eyes:     Pupils: Pupils are equal, round, and reactive to light.  Neck:     Vascular: No JVD.  Cardiovascular:     Rate and Rhythm: Normal rate and regular rhythm.  Pulmonary:     Effort: No accessory muscle usage or respiratory distress.     Breath sounds: No wheezing, rhonchi or rales.  Abdominal:     General: Bowel sounds are normal. There is no distension.     Palpations: Abdomen is soft.      Tenderness: There is no abdominal tenderness. There is no right CVA tenderness or left CVA tenderness.  Musculoskeletal:     Cervical back: Neck supple.     Right lower leg: No edema.     Left lower leg: No edema.  Skin:    General: Skin is warm and dry.  Neurological:     General: No focal deficit present.     Mental Status: He is alert and oriented to person, place, and time.     Data Reviewed:  Results are pending, will review when available.  Assessment and Plan: Principal Problem:   Lung abscess (HCC) Admit to telemetry/inpatient. Continue IV fluids. Continue Zosyn  resumed by ED. Follow-up blood culture and sensitivity Follow CBC and CMP in a.m. Pulmonology consult appreciated.  Active Problems:   Essential hypertension Continue amlodipine  5 mg p.o. daily. Continue carvedilol  6.25 mg p.o. twice daily Pulmonology consult appreciated.    Hyperlipemia, mixed Continue rosuvastatin  20 mg p.o. daily.    COPD GOLD I Bronchodilators as needed.    Hx of pulmonary emboli (HCC) Continue apixaban  5 mg p.o. twice daily.    CAD (coronary artery disease) On DOAC, beta-blocker and statin.    Depression, major, in partial remission Continue duloxetine  60 mg p.o. daily. Started on low-dose mirtazapine  for appetite stimulation.    Hypomagnesemia Continue magnesium  oxide 400 mg p.o. daily.    Tobacco dependence due to cigarettes Nicotine  replacement therapy as needed.    Pressure injury of skin Continue preventive measures. Continue local care.    Hyperglycemia Will add a hemoglobin A1c.    Advance Care Planning:   Code Status: Do not attempt resuscitation (DNR) PRE-ARREST INTERVENTIONS DESIRED   Consults: PCCM (Jennet Epley, MD)  Family Communication:   Severity of Illness: The appropriate patient status for this patient is OBSERVATION. Observation status is judged to be reasonable and necessary in order to provide the required intensity of service to  ensure the patient's safety. The patient's presenting symptoms, physical exam findings, and initial radiographic and laboratory data in the context of their medical condition is felt to place them at decreased risk for further clinical deterioration. Furthermore, it is anticipated that the patient will be medically stable for discharge from the hospital within 2 midnights of admission.   Author: Alm Dorn Castor, MD 04/08/2024 1:15 PM  For on call review [email or web address].      [1]  Allergies Allergen Reactions   Codeine Anaphylaxis   Hydrocodone  Itching   "

## 2024-04-08 NOTE — Consult Note (Signed)
 "  NAME:  Timothy Wood, MRN:  [id number], DOB:  1940-09-29, LOS: 0 ADMISSION DATE:  04/08/2024, CONSULTATION DATE: 04/08/2024 REFERRING MD: Dr. Celinda, CHIEF COMPLAINT: Weakness, shortness of breath  History of Present Illness:  Was recently hospitalized for a lung abscess, discharged on Augmentin  Continued to feel poorly at home Decreased appetite, more fatigue  For the last 3 days unable to get out of bed or walk  Cough remains about the same  Feels short of breath with any activity  Pertinent  Medical History   Past Medical History:  Diagnosis Date   Adenocarcinoma of lung, stage 1 (HCC)    Anemia    Angiodysplasia of cecum 11/23/2017   Multiple 1-2 mm   Arthritis    back possibly   Blood clotting disorder    Cataract    removed bilaterally    Chronic obstructive pulmonary disease (COPD) (HCC)    patient not aware   Clotting disorder    PE LUNG, dvt leg    Colon polyps    Depression    Diverticulosis    DVT (deep venous thrombosis) (HCC)    Post op  knee surgery   DVT (deep venous thrombosis) (HCC)    GERD (gastroesophageal reflux disease)    Histoplasmosis    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 diverticulosis also. 11/24/2010: 12 polyps removed - adenomas Anticipate routine repeat colonoscopy Spring 2013 Have recommended he see genetics counselor re ? Polyposis 03/27/2016 14 polyps removed max 15 mm    Pulmonary nodule 06/2006   9 mm right upper obe pulmonary nodule (negative Pet Scan)   PVD (peripheral vascular disease)    left leg s/p  stent   Sebaceous cyst    neck   Seborrheic keratosis    Seizures (HCC)    4-5 yrs ago had seizures when in ICU- none since this time    Sinusitis    Tobacco abuse    Tremor    Significant Hospital Events: Including procedures, antibiotic start and stop dates in addition to other pertinent events   03/23/2024-CT chest with a left upper lobe abscess 7.2 x 8.1 x 9.6, small pleural effusion 03/24/2024-BAL with no organism  Interim History / Subjective:  Weakness, fatigue, just generally not feeling well ongoing cough, no fever, no chills   Objective    Blood pressure 104/61, pulse 84, temperature 97.6 F (36.4 C), temperature source Oral, resp. rate 18, height 5' 8 (1.727 m), weight 62 kg, SpO2 96%.       No intake or output data in the 24 hours ending 04/08/24 1554 Filed Weights   04/08/24 1048  Weight: 62 kg    Examination: General: Elderly, does not appear to be in distress HENT: Moist oral mucosa Lungs: Fair air entry bilaterally clear Cardiovascular: S1-S2 appreciated Abdomen: Soft, bowel sounds appreciated Extremities: No clubbing, no edema Neuro: Alert and oriented x 3 GU:   I reviewed last 24 h vitals and pain scores, last 48 h intake and output, last 24  h labs and trends, and last 24 h imaging results.  CT scan was reviewed by myself White count of 20.7, hematocrit of 33.4, platelet count of 462 His white count is not higher than the last time he was in the hospital on 03/25/2024  Chest x-ray 04/08/2024 shows slight improvement  Jennet Epley, MD [place] PCCM Pager: See Amion    "

## 2024-04-08 NOTE — ED Triage Notes (Signed)
 Pt brought in by ems, from home, was here 03/25/24 for pneumonia. Since then weak, increase sob, productive cough, no improvement. SR with PACs BP 104/60 RA 90 [place] 98 Cbg 155 18 G LAC

## 2024-04-08 NOTE — ED Provider Notes (Signed)
 " [facility] EMERGENCY DEPARTMENT AT [facility] Provider Note   CSN: [id number] Arrival date & time: 04/08/24  1033     Patient presents with: Shortness of Breath and Weakness   RAJENDRA Wood is a 84 y.o. male.    Shortness of Breath Weakness Associated symptoms: shortness of breath   Patient with a admission in the hospital.  Discharged 14 days ago after pulmonary abscess.  He is on Augmentin  currently.  States he has been doing worse since he went home.  Is gradually worsening.  Decreased appetite.  More fatigue.  States been so fatigued and weak has been having difficulty getting up.  States he cannot really walk right now.  Not having fevers.  Similar cough.     Prior to Admission medications  Medication Sig Start Date End Date Taking? Authorizing Provider  Acetaminophen  500 MG capsule Take 1,000 mg by mouth daily as needed for fever.   Yes [provider]  amLODipine  (NORVASC ) 5 MG tablet Take 1 tablet (5 mg total) by mouth daily. 06/01/23  Yes Jordan, [name], MD  amoxicillin -clavulanate (AUGMENTIN ) 875-125 MG tablet Take 1 tablet by mouth 2 (two) times daily for 28 days. 03/25/24 04/22/24 Yes Christobal Guadalajara, MD  apixaban  (ELIQUIS ) 5 MG TABS tablet Take 1 tablet (5 mg total) by mouth 2 (two) times daily. 06/01/23  Yes Jordan, [name], MD  carvedilol  (COREG ) 6.25 MG tablet Take 1 tablet (6.25 mg total) by mouth 2 (two) times daily with a meal. 11/04/23  Yes Jordan, [name], MD  DULoxetine  (CYMBALTA ) 60 MG capsule Take 1 capsule (60 mg total) by mouth daily. 04/14/23  Yes Jordan, [name], MD  folic acid  (FOLVITE ) 1 MG tablet TAKE 1 TABLET BY MOUTH DAILY 05/24/23  Yes Jordan, [name], MD  irbesartan  (AVAPRO ) 150 MG tablet Take 1 tablet (150 mg total) by mouth daily. 06/01/23  Yes Jordan, [name], MD  magnesium  oxide (MAG-OX) 400 MG tablet Take 1 tablet (400 mg total) by mouth daily. 08/31/22  Yes Jordan, [name], MD  omeprazole  (PRILOSEC) 20 MG capsule TAKE 1 CAPSULE BY MOUTH  TWICE A DAY BEFORE MEALS Patient taking differently: Take 20 mg by mouth 2 (two) times daily before a meal. TAKE 1 CAPSULE BY MOUTH TWICE A DAY BEFORE MEALS 01/18/23  Yes Jordan, [name], MD  rosuvastatin  (CRESTOR ) 20 MG tablet Take 1 tablet (20 mg total) by mouth daily. 06/01/23  Yes Jordan, [name], MD  thiamine  (VITAMIN B1) 100 MG tablet Take 1 tablet (100 mg total) by mouth daily. 06/01/23  Yes Jordan, [name], MD  EPINEPHrine  (EPIPEN  2-PAK) 0.3 mg/0.3 mL IJ SOAJ injection To use daily if needed for severe allergy reaction. Patient taking differently: Inject 0.3 mg into the muscle as needed for anaphylaxis. To use daily if needed for severe allergy reaction. 08/20/21   Jordan, [name], MD    Allergies: Codeine and Hydrocodone     Review of Systems  Respiratory:  Positive for shortness of breath.   Neurological:  Positive for weakness.    Updated Vital Signs BP 104/61   Pulse 84   Temp 97.6 F (36.4 C) (Oral)   Resp 18   Ht 5' 8 (1.727 m)   Wt 62 kg   SpO2 96%   BMI 20.78 kg/m   Physical Exam Vitals and nursing note reviewed.  Cardiovascular:     Rate and Rhythm: Normal rate.  Pulmonary:     Breath sounds: Decreased breath sounds  present.     Comments: Somewhat decreased breath sounds throughout. Chest:     Chest wall: No tenderness.  Musculoskeletal:     Right lower leg: No edema.     Left lower leg: No edema.  Skin:    Capillary Refill: Capillary refill takes less than 2 seconds.  Neurological:     Mental Status: He is alert.     (all labs ordered are listed, but only abnormal results are displayed) Labs Reviewed  COMPREHENSIVE METABOLIC PANEL WITH GFR - Abnormal; Notable for the following components:      Result Value   Sodium 134 (*)    Glucose, Bld 173 (*)    Creatinine, Ser 0.60 (*)    Calcium  8.6 (*)    Total Protein 6.1 (*)    Albumin 2.9 (*)    Alkaline Phosphatase 168 (*)    All other components within normal limits  CBC WITH DIFFERENTIAL/PLATELET -  Abnormal; Notable for the following components:   WBC 20.7 (*)    RBC 3.82 (*)    Hemoglobin 10.3 (*)    HCT 33.4 (*)    Platelets 462 (*)    Neutro Abs 18.2 (*)    Monocytes Absolute 1.3 (*)    Abs Immature Granulocytes 0.16 (*)    All other components within normal limits  CBG MONITORING, ED - Abnormal; Notable for the following components:   Glucose-Capillary 171 (*)    All other components within normal limits  CULTURE, BLOOD (ROUTINE X 2)  CULTURE, BLOOD (ROUTINE X 2)  I-STAT CG4 LACTIC ACID, ED    EKG: Wood  Radiology: DG Chest Portable 1 View Result Date: 04/08/2024 CLINICAL DATA:  Shortness of breath.  Productive cough.  Pneumonia. EXAM: PORTABLE CHEST 1 VIEW COMPARISON:  03/24/2024, and CT on 03/23/2024 FINDINGS: Heart size is normal. Mild improvement in the left lung airspace disease is seen, with persistent dense consolidation in the central left upper lobe. Centrally obstructing left hilar mass or lymphadenopathy cannot be excluded. Right lung is clear. No evidence of pleural effusion. IMPRESSION: Mild improvement in left lung airspace disease, with persistent dense consolidation in the central left upper lobe. Centrally obstructing left hilar mass or lymphadenopathy cannot be excluded. Electronically Signed   By: Norleen DELENA Kil M.D.   On: 04/08/2024 12:05     Procedures   Medications Ordered in the ED  sodium chloride  0.9 % bolus 1,000 mL (0 mLs Intravenous Stopped 04/08/24 1326)                                    Medical Decision Making Amount and/or Complexity of Data Reviewed Labs: ordered. Radiology: ordered.  Risk Decision regarding hospitalization.   Patient with generalized weakness.  Recent treatment for pneumonia.  More dyspnea.  Differential diagnose includes acute kidney injury, dehydration, worsening infection.  Will get x-ray and place blood work to start with.  Patient is already on Augmentin .  X-ray stable to prior to improved.  White count also  similar to discharge.  However with generalized weakness despite being on antibiotics I think patient benefit from admission in the hospital.  Discussed with hospitalist.      Final diagnoses:  Abscess of left lung with pneumonia, unspecified part of lung [facility])    ED Discharge Orders     Wood          Patsey Lot, MD 04/08/24 1453  "

## 2024-04-09 DIAGNOSIS — F17210 Nicotine dependence, cigarettes, uncomplicated: Secondary | ICD-10-CM

## 2024-04-09 DIAGNOSIS — J852 Abscess of lung without pneumonia: Secondary | ICD-10-CM | POA: Diagnosis not present

## 2024-04-09 DIAGNOSIS — E782 Mixed hyperlipidemia: Secondary | ICD-10-CM | POA: Diagnosis not present

## 2024-04-09 DIAGNOSIS — J449 Chronic obstructive pulmonary disease, unspecified: Secondary | ICD-10-CM

## 2024-04-09 DIAGNOSIS — Z8679 Personal history of other diseases of the circulatory system: Secondary | ICD-10-CM | POA: Diagnosis not present

## 2024-04-09 DIAGNOSIS — Z902 Acquired absence of lung [part of]: Secondary | ICD-10-CM

## 2024-04-09 DIAGNOSIS — F3341 Major depressive disorder, recurrent, in partial remission: Secondary | ICD-10-CM | POA: Diagnosis not present

## 2024-04-09 DIAGNOSIS — Z8619 Personal history of other infectious and parasitic diseases: Secondary | ICD-10-CM

## 2024-04-09 DIAGNOSIS — R5383 Other fatigue: Secondary | ICD-10-CM

## 2024-04-09 DIAGNOSIS — R739 Hyperglycemia, unspecified: Secondary | ICD-10-CM

## 2024-04-09 DIAGNOSIS — I2699 Other pulmonary embolism without acute cor pulmonale: Secondary | ICD-10-CM | POA: Diagnosis not present

## 2024-04-09 DIAGNOSIS — L89159 Pressure ulcer of sacral region, unspecified stage: Secondary | ICD-10-CM

## 2024-04-09 DIAGNOSIS — Z85118 Personal history of other malignant neoplasm of bronchus and lung: Secondary | ICD-10-CM

## 2024-04-09 DIAGNOSIS — I10 Essential (primary) hypertension: Secondary | ICD-10-CM

## 2024-04-09 DIAGNOSIS — J851 Abscess of lung with pneumonia: Secondary | ICD-10-CM | POA: Diagnosis not present

## 2024-04-09 DIAGNOSIS — D72829 Elevated white blood cell count, unspecified: Secondary | ICD-10-CM

## 2024-04-09 LAB — CBC
HCT: 36 % — ABNORMAL LOW (ref 39.0–52.0)
Hemoglobin: 10.6 g/dL — ABNORMAL LOW (ref 13.0–17.0)
MCH: 26.4 pg (ref 26.0–34.0)
MCHC: 29.4 g/dL — ABNORMAL LOW (ref 30.0–36.0)
MCV: 89.8 fL (ref 80.0–100.0)
Platelets: 428 10*3/uL — ABNORMAL HIGH (ref 150–400)
RBC: 4.01 MIL/uL — ABNORMAL LOW (ref 4.22–5.81)
RDW: 15.1 % (ref 11.5–15.5)
WBC: 16 10*3/uL — ABNORMAL HIGH (ref 4.0–10.5)
nRBC: 0 % (ref 0.0–0.2)

## 2024-04-09 LAB — COMPREHENSIVE METABOLIC PANEL WITH GFR
ALT: 27 U/L (ref 0–44)
AST: 22 U/L (ref 15–41)
Albumin: 2.8 g/dL — ABNORMAL LOW (ref 3.5–5.0)
Alkaline Phosphatase: 157 U/L — ABNORMAL HIGH (ref 38–126)
Anion gap: 10 (ref 5–15)
BUN: 8 mg/dL (ref 8–23)
CO2: 25 mmol/L (ref 22–32)
Calcium: 8.6 mg/dL — ABNORMAL LOW (ref 8.9–10.3)
Chloride: 99 mmol/L (ref 98–111)
Creatinine, Ser: 0.63 mg/dL (ref 0.61–1.24)
GFR, Estimated: >60 mL/min
Glucose, Bld: 89 mg/dL (ref 70–99)
Potassium: 3.7 mmol/L (ref 3.5–5.1)
Sodium: 134 mmol/L — ABNORMAL LOW (ref 135–145)
Total Bilirubin: 0.3 mg/dL (ref 0.0–1.2)
Total Protein: 6.1 g/dL — ABNORMAL LOW (ref 6.5–8.1)

## 2024-04-09 LAB — HEMOGLOBIN A1C
Hgb A1c MFr Bld: 5.9 % — ABNORMAL HIGH (ref 4.8–5.6)
Mean Plasma Glucose: 122.63 mg/dL

## 2024-04-09 MED ORDER — ZINC OXIDE 40 % EX OINT
1.0000 | TOPICAL_OINTMENT | Freq: Two times a day (BID) | CUTANEOUS | Status: DC
  Administered 2024-04-09 – 2024-04-14 (×11): 1 via TOPICAL
  Filled 2024-04-09: qty 57

## 2024-04-09 MED ORDER — COLLAGENASE 250 UNIT/GM EX OINT
1.0000 | TOPICAL_OINTMENT | Freq: Every day | CUTANEOUS | Status: DC
  Administered 2024-04-09 – 2024-04-14 (×6): 1 via TOPICAL
  Filled 2024-04-09: qty 30

## 2024-04-09 NOTE — Hospital Course (Addendum)
 Partly taken from prior notes.  Timothy Wood is a 84 y.o. male with medical history significant of stage I lung adenocarcinoma, C, nodular dysplasia, osteoarthritis, blood clotting disorder, cataract, history of DVT, peripheral vascular disease, depression, diverticulosis, GERD,  hyperlipidemia hypertension, lumbar compression fracture, seizures while in ICU years ago without further episodes since then, sinusitis, pulmonary nodule, histoplasmosis with pneumonia, COPD, history of pneumonia who was recently admitted and discharged for 3 days earlier this month due to lung abscess who is coming to the emergency department due to generalized weakness, fatigue and dyspnea.  He has been having significant difficulty just trying to get out of bed.  He was initially treated with Zosyn  and has been taking Augmentin  at home.    On presentation vital stable, labs with persistent leukocytosis and mildly elevated alkaline phosphatase at 168.  Chest x-ray with mild improvement in left lung airspace disease with persistent consolidation.  CT chest without contrast with increased airspace disease and consolidation in the left upper and lower lobes.  Small left pleural effusion and emphysema.  Zosyn  was resumed, cultures were sent.  Pulmonary was consulted.  1/25: Hemodynamically stable, small improvement in leukocytosis.  Patient has BAL on 03/24/2024 with no organisms.  PT and OT evaluation ordered.  1/26: Remained hemodynamically stable, improving leukocytosis and CRP at 8.2, A1c of 5.9.  Pending PT and OT evaluation for disposition.  1/27: Remained hemodynamically stable.  PT is recommending SNF-TOC to work on placement.

## 2024-04-09 NOTE — Assessment & Plan Note (Addendum)
 Patient was on 2 L off oxygen without any documented hypoxia. No wheezing  -Continue with bronchodilator -Wean from oxygen as tolerated

## 2024-04-09 NOTE — Assessment & Plan Note (Signed)
 Continue home statin  Continue home statin

## 2024-04-09 NOTE — Progress Notes (Signed)
 "  NAME:  Timothy Wood, MRN:  [id number], DOB:  1941/02/04, LOS: 1 ADMISSION DATE:  04/08/2024, CONSULTATION DATE: 04/08/2024 REFERRING MD: Dr. Celinda, CHIEF COMPLAINT: Weakness, shortness of breath  History of Present Illness:  Was recently hospitalized for a lung abscess, discharged on Augmentin  Continued to feel poorly at home Decreased appetite, more fatigue   For the last 3 days unable to get out of bed or walk   Cough remains about the same   Feels short of breath with any activity  Pertinent  Medical History   Past Medical History:  Diagnosis Date   Adenocarcinoma of lung, stage 1 (HCC)    Anemia    Angiodysplasia of cecum 11/23/2017   Multiple 1-2 mm   Arthritis    back possibly   Blood clotting disorder    Cataract    removed bilaterally    Chronic obstructive pulmonary disease (COPD) (HCC)    patient not aware   Clotting disorder    PE LUNG, dvt leg    Colon polyps    Depression    Diverticulosis    DVT (deep venous thrombosis) (HCC)    Post op  knee surgery   DVT (deep venous thrombosis) (HCC)    GERD (gastroesophageal reflux disease)    Histoplasmosis    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 diverticulosis also. 11/24/2010: 12 polyps removed - adenomas Anticipate routine repeat colonoscopy Spring 2013 Have recommended he see genetics counselor re ? Polyposis 03/27/2016 14 polyps removed max 15 mm    Pulmonary nodule 06/2006   9 mm right upper obe pulmonary nodule (negative Pet Scan)   PVD (peripheral vascular disease)    left leg  s/p stent   Sebaceous cyst    neck   Seborrheic keratosis    Seizures (HCC)    4-5 yrs ago had seizures when in ICU- none since this time    Sinusitis    Tobacco abuse    Tremor    Significant Hospital Events: Including procedures, antibiotic start and stop dates in addition to other pertinent events   03/23/2024-CT CT chest with left upper lobe abscess 7.2 x 8.1 x 9.6 with a small pleural effusion 03/24/2024-BAL-no organism, fungal stains did not show anything, fungal cultures still pending CT scan 04/08/2024-patchy infiltrate maybe slightly increased, postsurgical changes on the right, emphysema  Interim History / Subjective:  Feels a little bit better Denies any pain or discomfort at present  Objective    Blood pressure 113/70, pulse 79, temperature 98.1 F (36.7 C), temperature source Oral, resp. rate 16, height 5' 8 (1.727 m), weight 62 kg, SpO2 94%.        Intake/Output Summary (Last 24 hours) at 04/09/2024 1312 Last data filed at 04/09/2024 0300 Gross per 24 hour  Intake 450 ml  Output 251 ml  Net 199 ml   Filed Weights   04/08/24 1048  Weight: 62 kg    Examination: General: Elderly, does not appear to be in distress HENT: Moist oral mucosa  Lungs: Decreased air movement bilaterally Cardiovascular: S1-S2 appreciated Abdomen: Soft, bowel sounds appreciated Extremities: No clubbing, no edema Neuro: Alert and oriented x 3 GU:   Resolved problem list   Assessment and Plan   Lung abscess Was initially treated with Zosyn , transitioned to Augmentin  and discharged home - Cultures from bronchoscopy did not show any organism, fungal culture still pending -He has not been having fevers -Restarted on Zosyn  - Leukocytosis is better - Will continue Zosyn  at present - Follow-up CBC, follow-up CRP in a.m. - Did not have any endobronchial process noted on bronchoscopy to suggest postobstructive disease - If he continues to trend well, plan will be to continue Augmentin   with a possible CT scan in about 2-3 with contrast.  Underlying obstructive lung disease - Continue bronchodilator treatment  History of pulmonary emboli - Continue Eliquis   History of histoplasmosis that was treated in 2017  Stage I lung cancer diagnosed in April 2016 - Had wedge resection right middle lobe and right upper lobe - Follows up with Dr. Sherlie status  Hypertension - On home antihypertensives  Decreased appetite, fatigue - Started on mirtazapine   Sacral pressure injuries - Wound care  Will need physical therapy  Jennet Epley, MD [facility] Pager: See Amion       "

## 2024-04-09 NOTE — Assessment & Plan Note (Signed)
 Mild hyperglycemia noted yesterday, no history of diabetes.  Blood glucose normal this morning. - A1c pending

## 2024-04-09 NOTE — Assessment & Plan Note (Signed)
 Clinically stable on baseline oxygen use of 3 L.  CT with concern of some increase in consolidation.  Improving leukocytosis today.  Pulmonary was consulted - Continue with Zosyn  -Continue with supportive care -Appreciate pulmonary recommendations

## 2024-04-09 NOTE — Assessment & Plan Note (Signed)
 Nicotine patch as needed

## 2024-04-09 NOTE — Consult Note (Addendum)
" °  CLINICAL SUPPORT TEAM - WOUND OSTOMY AND CONTINENCE TEAM  CONSULTATION SERVICES   WOC Nurse-Inpatient Note  WOC Nurse Consult Note: Reason for Consult: pressure injuries  Wound type: 1. Unstageable Pressure Injury sacrum 60% tan and brown necrotic 40% pink  2.  ? Deep tissue pressure injuries medial buttocks purple discoloration  Pressure Injury POA: Yes Measurement: see nursing flowsheet  Wound bed: as above  Drainage (amount, consistency, odor) see nursing flowsheet  Periwound:dark discoloration to existing sacral wound; some erythema noted medial buttocks  Dressing procedure/placement/frequency: Cleanse sacral wound with NS, Apply 1/4 thick layer of Santyl  to wound bed daily,  top with saline moist gauze, dry gauze and silicone foam or ABD pad and tape whichever is preferred.   Will order a thin layer of Desitin to skin of medial buttocks 2 times daily and prn soiling.   Turn and reposition q2 hours to offload pressure.    POC discussed with bedside nurse. WOC team will not follow. Reconsult if further needs arise.   Thank you,    Malaiah Viramontes MSN, RN-BC, CWOCN  "

## 2024-04-09 NOTE — Assessment & Plan Note (Addendum)
 Blood pressure currently within goal. -Continue home amlodipine , irbesartan  and Coreg 

## 2024-04-09 NOTE — Plan of Care (Signed)
  Problem: Activity: Goal: Risk for activity intolerance will decrease Outcome: Progressing   Problem: Nutrition: Goal: Adequate nutrition will be maintained Outcome: Progressing   Problem: Coping: Goal: Level of anxiety will decrease Outcome: Progressing   Problem: Pain Managment: Goal: General experience of comfort will improve and/or be controlled Outcome: Progressing

## 2024-04-09 NOTE — Plan of Care (Signed)
   Problem: Education: Goal: Knowledge of General Education information will improve Description: Including pain rating scale, medication(s)/side effects and non-pharmacologic comfort measures Outcome: Not Progressing   Problem: Health Behavior/Discharge Planning: Goal: Ability to manage health-related needs will improve Outcome: Not Progressing   Problem: Clinical Measurements: Goal: Ability to maintain clinical measurements within normal limits will improve Outcome: Not Progressing Goal: Will remain free from infection Outcome: Not Progressing Goal: Diagnostic test results will improve Outcome: Not Progressing Goal: Respiratory complications will improve Outcome: Not Progressing

## 2024-04-09 NOTE — Assessment & Plan Note (Signed)
 Patient with history of pressure injuries-present on admission. - Wound care consult

## 2024-04-09 NOTE — Assessment & Plan Note (Signed)
-   Continuing home duloxetine  and mirtazapine

## 2024-04-09 NOTE — Assessment & Plan Note (Signed)
-   Continuing home magnesium  supplement

## 2024-04-09 NOTE — Progress Notes (Signed)
 " Progress Note   Patient: Timothy Wood FMW:[id number] DOB: March 02, 1941 DOA: 04/08/2024     1 DOS: the patient was seen and examined on 04/09/2024   Brief hospital course: Partly taken from prior notes.  Timothy Wood is a 84 y.o. male with medical history significant of stage I lung adenocarcinoma, C, nodular dysplasia, osteoarthritis, blood clotting disorder, cataract, history of DVT, peripheral vascular disease, depression, diverticulosis, GERD,  hyperlipidemia hypertension, lumbar compression fracture, seizures while in ICU years ago without further episodes since then, sinusitis, pulmonary nodule, histoplasmosis with pneumonia, COPD, history of pneumonia who was recently admitted and discharged for 3 days earlier this month due to lung abscess who is coming to the emergency department due to generalized weakness, fatigue and dyspnea.  He has been having significant difficulty just trying to get out of bed.  He was initially treated with Zosyn  and has been taking Augmentin  at home.    On presentation vital stable, labs with persistent leukocytosis and mildly elevated alkaline phosphatase at 168.  Chest x-ray with mild improvement in left lung airspace disease with persistent consolidation.  CT chest without contrast with increased airspace disease and consolidation in the left upper and lower lobes.  Small left pleural effusion and emphysema.  Zosyn  was resumed, cultures were sent.  Pulmonary was consulted.  1/25: Hemodynamically stable, small improvement in leukocytosis.  Patient has BAL on 03/24/2024 with no organisms.  PT and OT evaluation ordered.  Assessment and Plan: * Lung abscess (HCC) Clinically stable on baseline oxygen use of 3 L.  CT with concern of some increase in consolidation.  Improving leukocytosis today.  Pulmonary was consulted - Continue with Zosyn  -Continue with supportive care -Appreciate pulmonary recommendations  COPD GOLD I Patient was on 2 L off oxygen without  any documented hypoxia. No wheezing  -Continue with bronchodilator -Wean from oxygen as tolerated  Hx of pulmonary emboli (HCC) - Continue with Eliquis   Essential hypertension Blood pressure currently within goal. -Continue home amlodipine , irbesartan  and Coreg   History of CAD (coronary artery disease) No chest pain or acute concern. - Continue home Eliquis , beta-blocker and statin  Hypomagnesemia - Continuing home magnesium  supplement  Hyperlipemia, mixed - Continue home statin  Depression, major, in partial remission - Continuing home duloxetine  and mirtazapine   Pressure injury of skin Patient with history of pressure injuries-present on admission. - Wound care consult  Tobacco dependence due to cigarettes - Nicotine  patch as needed  Hyperglycemia Mild hyperglycemia noted yesterday, no history of diabetes.  Blood glucose normal this morning. - A1c pending      Subjective: Patient was seen and examined today.  No new concern.  Feeling very weak and tired.   Physical Exam: Vitals:   04/08/24 2058 04/09/24 0108 04/09/24 0547 04/09/24 1006  BP: (!) 106/53 (!) 118/40 128/61 113/70  Pulse: 77 71 79   Resp: 16 16 16    Temp: 97.8 F (36.6 C) 98 F (36.7 C) 98.1 F (36.7 C)   TempSrc: Oral Oral Oral   SpO2: 95% 100% 94%   Weight:      Height:       General.  Frail and malnourished elderly man, in no acute distress. Pulmonary.  Lungs clear bilaterally, normal respiratory effort. CV.  Regular rate and rhythm, no JVD, rub or murmur. Abdomen.  Soft, nontender, nondistended, BS positive. CNS.  Alert and oriented .  No focal neurologic deficit. Extremities.  No edema, pulses intact and symmetrical. Psychiatry.  Judgment and insight appears normal.  Data Reviewed: Prior data reviewed  Family Communication: Talked with daughter on phone.  Disposition: Status is: Inpatient Remains inpatient appropriate because: Severity of illness  Planned Discharge  Destination: To be determined  DVT prophylaxis.  Eliquis  Time spent: 50 minutes  This record has been created using Conservation officer, historic buildings. Errors have been sought and corrected,but may not always be located. Such creation errors do not reflect on the standard of care.   Author: Amaryllis Dare, MD 04/09/2024 11:36 AM  For on call review [email or web address].  "

## 2024-04-09 NOTE — Assessment & Plan Note (Signed)
 Continue with Eliquis 

## 2024-04-09 NOTE — Assessment & Plan Note (Signed)
 No chest pain or acute concern. - Continue home Eliquis , beta-blocker and statin

## 2024-04-10 ENCOUNTER — Other Ambulatory Visit: Payer: Self-pay | Admitting: Pulmonary Disease

## 2024-04-10 ENCOUNTER — Telehealth: Payer: Self-pay | Admitting: Pulmonary Disease

## 2024-04-10 DIAGNOSIS — I10 Essential (primary) hypertension: Secondary | ICD-10-CM | POA: Diagnosis not present

## 2024-04-10 DIAGNOSIS — J449 Chronic obstructive pulmonary disease, unspecified: Secondary | ICD-10-CM | POA: Diagnosis not present

## 2024-04-10 DIAGNOSIS — J851 Abscess of lung with pneumonia: Secondary | ICD-10-CM | POA: Diagnosis not present

## 2024-04-10 DIAGNOSIS — I2699 Other pulmonary embolism without acute cor pulmonale: Secondary | ICD-10-CM | POA: Diagnosis not present

## 2024-04-10 DIAGNOSIS — J852 Abscess of lung without pneumonia: Secondary | ICD-10-CM | POA: Diagnosis not present

## 2024-04-10 LAB — CBC
HCT: 34 % — ABNORMAL LOW (ref 39.0–52.0)
Hemoglobin: 10.1 g/dL — ABNORMAL LOW (ref 13.0–17.0)
MCH: 26.6 pg (ref 26.0–34.0)
MCHC: 29.7 g/dL — ABNORMAL LOW (ref 30.0–36.0)
MCV: 89.7 fL (ref 80.0–100.0)
Platelets: 437 10*3/uL — ABNORMAL HIGH (ref 150–400)
RBC: 3.79 MIL/uL — ABNORMAL LOW (ref 4.22–5.81)
RDW: 15.2 % (ref 11.5–15.5)
WBC: 14.7 10*3/uL — ABNORMAL HIGH (ref 4.0–10.5)
nRBC: 0 % (ref 0.0–0.2)

## 2024-04-10 LAB — C-REACTIVE PROTEIN: CRP: 8.2 mg/dL — ABNORMAL HIGH

## 2024-04-10 MED ORDER — AMOXICILLIN-POT CLAVULANATE 875-125 MG PO TABS
1.0000 | ORAL_TABLET | Freq: Two times a day (BID) | ORAL | Status: DC
  Administered 2024-04-10 – 2024-04-14 (×9): 1 via ORAL
  Filled 2024-04-10 (×9): qty 1

## 2024-04-10 MED ORDER — ENSURE PLUS HIGH PROTEIN PO LIQD
237.0000 mL | Freq: Two times a day (BID) | ORAL | Status: DC
  Administered 2024-04-11 – 2024-04-14 (×6): 237 mL via ORAL

## 2024-04-10 NOTE — Evaluation (Signed)
 Physical Therapy Evaluation Patient Details Name: Timothy Wood MRN: [id number] DOB: 11-09-1940 Today's Date: 04/10/2024  History of Present Illness  84 yo male admitted with weakness. Hx of lung Ca, back surgery, DVT, PE, COPD, anemia, lumbar compression fractures, falls  Clinical Impression  On eval, pt required Min  A +2 for safety for mobility. He was able to take a few steps in the room with use of a RW. Pt presents with general weakness, decreased activity tolerance, and impaired gait/balance. Pt reports he lives alone and has been having difficulty managing/mobilizing at home. Patient will benefit from continued inpatient follow up therapy, <3 hours/day prior to returning home, if agreeable. Will continue to follow pt during this hospital stay.        If plan is discharge home, recommend the following: Assistance with cooking/housework;Assist for transportation;Help with stairs or ramp for entrance;A little help with walking and/or transfers;A lot of help with bathing/dressing/bathroom   Can travel by private vehicle   Yes    Equipment Recommendations None recommended by PT  Recommendations for Other Services       Functional Status Assessment Patient has had a recent decline in their functional status and demonstrates the ability to make significant improvements in function in a reasonable and predictable amount of time.     Precautions / Restrictions Precautions Precautions: Fall Restrictions Weight Bearing Restrictions Per Provider Order: No      Mobility  Bed Mobility Overal bed mobility: Needs Assistance Bed Mobility: Supine to Sit     Supine to sit: Min assist          Transfers Overall transfer level: Needs assistance Equipment used: Rolling walker (2 wheels) Transfers: Sit to/from Stand Sit to Stand: Min assist, +2 safety/equipment           General transfer comment: Assist to rise, steady, control descent. Cues for safety.     Ambulation/Gait Ambulation/Gait assistance: Min assist, +2 safety/equipment Gait Distance (Feet): 3 Feet Assistive device: Rolling walker (2 wheels)         General Gait Details: Pt was able to take a few steps from bed over to recliner using RW. Assist to stabilize and safely manage RW. Limited by fatigue, weakness.  Stairs            Wheelchair Mobility     Tilt Bed    Modified Rankin (Stroke Patients Only)       Balance Overall balance assessment: Needs assistance, History of Falls         Standing balance support: Bilateral upper extremity supported, During functional activity, Reliant on assistive device for balance Standing balance-Leahy Scale: Poor                               Pertinent Vitals/Pain Pain Assessment Pain Assessment: Faces Pain Location: ;low back Pain Descriptors / Indicators: Aching, Constant Pain Intervention(s): Limited activity within patient's tolerance, Monitored during session, Repositioned    Home Living Family/patient expects to be discharged to:: Unsure Living Arrangements: Alone Available Help at Discharge: Family;Available PRN/intermittently Type of Home: House Home Access: Stairs to enter Entrance Stairs-Rails: None Entrance Stairs-Number of Steps: 1   Home Layout: One level Home Equipment: Rollator (4 wheels);Shower seat;Grab bars - tub/shower      Prior Function Prior Level of Function : Independent/Modified Independent             Mobility Comments: has rollator available for use. having difficulty mobilizing  for last couple of weeks ADLs Comments: (P) hasn't gotten in the shower for the past week. Normally independent.     Extremity/Trunk Assessment   Upper Extremity Assessment Upper Extremity Assessment: Defer to OT evaluation    Lower Extremity Assessment Lower Extremity Assessment: Generalized weakness    Cervical / Trunk Assessment Cervical / Trunk Assessment: Normal   Communication   Communication Communication: Impaired Factors Affecting Communication: Hearing impaired    Cognition Arousal: Alert Behavior During Therapy: WFL for tasks assessed/performed   PT - Cognitive impairments: Safety/Judgement                         Following commands: Intact       Cueing Cueing Techniques: Verbal cues     General Comments      Exercises     Assessment/Plan    PT Assessment Patient needs continued PT services  PT Problem List Decreased strength;Decreased range of motion;Decreased activity tolerance;Decreased balance;Decreased mobility;Decreased knowledge of use of DME       PT Treatment Interventions DME instruction;Gait training;Functional mobility training;Therapeutic activities;Therapeutic exercise;Patient/family education;Balance training    PT Goals (Current goals can be found in the Care Plan section)  Acute Rehab PT Goals Patient Stated Goal: none stated PT Goal Formulation: With patient Time For Goal Achievement: 04/24/24 Potential to Achieve Goals: Good    Frequency Min 3X/week     Co-evaluation               AM-PAC PT 6 Clicks Mobility  Outcome Measure Help needed turning from your back to your side while in a flat bed without using bedrails?: A Little Help needed moving from lying on your back to sitting on the side of a flat bed without using bedrails?: A Little Help needed moving to and from a bed to a chair (including a wheelchair)?: A Little Help needed standing up from a chair using your arms (e.g., wheelchair or bedside chair)?: A Little Help needed to walk in hospital room?: A Lot Help needed climbing 3-5 steps with a railing? : Total 6 Click Score: 15    End of Session Equipment Utilized During Treatment: Gait belt Activity Tolerance: Patient limited by fatigue Patient left: in chair;with call bell/phone within reach (no chair alarms available for use; bed soiled and linens removed)   PT  Visit Diagnosis: Muscle weakness (generalized) (M62.81);Difficulty in walking, not elsewhere classified (R26.2);History of falling (Z91.81)    Time: 1341-1405 PT Time Calculation (min) (ACUTE ONLY): 24 min   Charges:   PT Evaluation $PT Eval Low Complexity: 1 Low   PT General Charges $$ ACUTE PT VISIT: 1 Visit            Dannial SQUIBB, PT Acute Rehabilitation  Office: [phone]

## 2024-04-10 NOTE — Assessment & Plan Note (Signed)
 Blood pressure currently within goal. -Continue home amlodipine , irbesartan  and Coreg 

## 2024-04-10 NOTE — Progress Notes (Signed)
 " Progress Note   Patient: Timothy Wood FMW:[id number] DOB: 1940-08-05 DOA: 04/08/2024     2 DOS: the patient was seen and examined on 04/10/2024   Brief hospital course: Partly taken from prior notes.  LEEVON Wood is a 84 y.o. male with medical history significant of stage I lung adenocarcinoma, C, nodular dysplasia, osteoarthritis, blood clotting disorder, cataract, history of DVT, peripheral vascular disease, depression, diverticulosis, GERD,  hyperlipidemia hypertension, lumbar compression fracture, seizures while in ICU years ago without further episodes since then, sinusitis, pulmonary nodule, histoplasmosis with pneumonia, COPD, history of pneumonia who was recently admitted and discharged for 3 days earlier this month due to lung abscess who is coming to the emergency department due to generalized weakness, fatigue and dyspnea.  He has been having significant difficulty just trying to get out of bed.  He was initially treated with Zosyn  and has been taking Augmentin  at home.    On presentation vital stable, labs with persistent leukocytosis and mildly elevated alkaline phosphatase at 168.  Chest x-ray with mild improvement in left lung airspace disease with persistent consolidation.  CT chest without contrast with increased airspace disease and consolidation in the left upper and lower lobes.  Small left pleural effusion and emphysema.  Zosyn  was resumed, cultures were sent.  Pulmonary was consulted.  1/25: Hemodynamically stable, small improvement in leukocytosis.  Patient has BAL on 03/24/2024 with no organisms.  PT and OT evaluation ordered.  1/26: Remained hemodynamically stable, improving leukocytosis and CRP at 8.2, A1c of 5.9.  Pending PT and OT evaluation for disposition.  Assessment and Plan: * Lung abscess (HCC) Clinically stable on baseline oxygen use of 3 L.  CT with concern of some increase in consolidation.  Improving leukocytosis today.  Pulmonary was consulted -  Continue with Zosyn -will continue Zosyn  while in the hospital and patient can be discharged on Augmentin  per pulmonary. -Continue with supportive care -Patient will need a repeat CT chest in 3 weeks which was ordered by pulmonary. -Appreciate pulmonary recommendations  COPD GOLD I Patient was on 2 L off oxygen without any documented hypoxia. No wheezing  -Continue with bronchodilator -Wean from oxygen as tolerated  Hx of pulmonary emboli (HCC) - Continue with Eliquis   Essential hypertension Blood pressure currently within goal. -Continue home amlodipine , irbesartan  and Coreg   History of CAD (coronary artery disease) No chest pain or acute concern. - Continue home Eliquis , beta-blocker and statin  Hypomagnesemia - Continuing home magnesium  supplement  Hyperlipemia, mixed - Continue home statin  Depression, major, in partial remission - Continuing home duloxetine  and mirtazapine   Pressure injury of skin Patient with history of pressure injuries-present on admission. - Wound care consult  Tobacco dependence due to cigarettes - Nicotine  patch as needed  Hyperglycemia Mild hyperglycemia noted yesterday, no history of diabetes.  Blood glucose normal this morning. - A1c pending      Subjective: Patient was seen and examined today.  No new concern.  Still feeling weak.  Physical Exam: Vitals:   04/09/24 1350 04/09/24 2132 04/10/24 0530 04/10/24 0845  BP: 98/68 101/73 118/60 112/85  Pulse: 85 76 82   Resp: 18  16   Temp:  (!) 97.5 F (36.4 C) 97.6 F (36.4 C)   TempSrc:  Axillary Oral   SpO2: 92%  93%   Weight:      Height:       General.  Malnourished, frail elderly man, in no acute distress. Pulmonary.  Lungs clear bilaterally, normal respiratory effort. CV.  Regular rate and rhythm, no JVD, rub or murmur. Abdomen.  Soft, nontender, nondistended, BS positive. CNS.  Alert and oriented .  No focal neurologic deficit. Extremities.  No edema, no cyanosis, pulses  intact and symmetrical. Psychiatry.  Judgment and insight appears normal.    Data Reviewed: Prior data reviewed  Family Communication: Talked with daughter on phone.  Disposition: Status is: Inpatient Remains inpatient appropriate because: Severity of illness  Planned Discharge Destination: To be determined  DVT prophylaxis.  Eliquis  Time spent: 50 minutes  This record has been created using Conservation officer, historic buildings. Errors have been sought and corrected,but may not always be located. Such creation errors do not reflect on the standard of care.   Author: Amaryllis Dare, MD 04/10/2024 1:25 PM  For on call review [email or web address].  "

## 2024-04-10 NOTE — Plan of Care (Signed)

## 2024-04-10 NOTE — Telephone Encounter (Signed)
 Patient with lung abscess  Ordered CT scan of the chest for about 3 weeks  Need to follow-up visit in about 4 weeks-should be seen in this timeline.  Is not established long term with anyone so can be scheduled with anyone

## 2024-04-10 NOTE — Progress Notes (Signed)
 "  NAME:  Timothy Wood, MRN:  [id number], DOB:  Jul 29, 1940, LOS: 2 ADMISSION DATE:  04/08/2024, CONSULTATION DATE: 04/08/2024 REFERRING MD: Dr. Celinda, CHIEF COMPLAINT: Weakness, shortness of breath  History of Present Illness:  Was recently hospitalized for a lung abscess, discharged on Augmentin  Continued to feel poorly at home Decreased appetite, more fatigue   For the last 3 days unable to get out of bed or walk   Cough remains about the same   Feels short of breath with any activity  Pertinent  Medical History   Past Medical History:  Diagnosis Date   Adenocarcinoma of lung, stage 1 (HCC)    Anemia    Angiodysplasia of cecum 11/23/2017   Multiple 1-2 mm   Arthritis    back possibly   Blood clotting disorder    Cataract    removed bilaterally    Chronic obstructive pulmonary disease (COPD) (HCC)    patient not aware   Clotting disorder    PE LUNG, dvt leg    Colon polyps    Depression    Diverticulosis    DVT (deep venous thrombosis) (HCC)    Post op  knee surgery   DVT (deep venous thrombosis) (HCC)    GERD (gastroesophageal reflux disease)    Histoplasmosis    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 diverticulosis also. 11/24/2010: 12 polyps removed - adenomas Anticipate routine repeat colonoscopy Spring 2013 Have recommended he see genetics counselor re ? Polyposis 03/27/2016 14 polyps removed max 15 mm    Pulmonary nodule 06/2006   9 mm right upper obe pulmonary nodule (negative Pet Scan)   PVD (peripheral vascular disease)    left leg  s/p stent   Sebaceous cyst    neck   Seborrheic keratosis    Seizures (HCC)    4-5 yrs ago had seizures when in ICU- none since this time    Sinusitis    Tobacco abuse    Tremor    Significant Hospital Events: Including procedures, antibiotic start and stop dates in addition to other pertinent events   03/23/2024-CT CT chest with left upper lobe abscess 7.2 x 8.1 x 9.6 with a small pleural effusion 03/24/2024-BAL-no organism, fungal stains did not show anything, fungal cultures still pending CT scan 04/08/2024-patchy infiltrate maybe slightly increased, postsurgical changes on the right, emphysema  Interim History / Subjective:  Continues to feel better Still has a minimal cough, some chest discomfort  Objective    Blood pressure 112/85, pulse 82, temperature 97.6 F (36.4 C), temperature source Oral, resp. rate 16, height 5' 8 (1.727 m), weight 62 kg, SpO2 93%.        Intake/Output Summary (Last 24 hours) at 04/10/2024 9061 Last data filed at 04/10/2024 0900 Gross per 24 hour  Intake 600 ml  Output 1925 ml  Net -1325 ml   Filed Weights   04/08/24 1048  Weight: 62 kg    Examination: General: Elderly, does not appear to be in distress HENT: Moist oral mucosa  Lungs: Decreased air movement bilaterally Cardiovascular: S1-S2 appreciated Abdomen: Soft, bowel sounds appreciated  I reviewed last 24 h vitals and pain scores, last 48 h intake and output, last 24 h labs and trends, and last 24 h imaging results. Leukocytosis is improving - CRP only 1  Resolved problem list   Assessment and Plan   Lung abscess Was initially treated with Zosyn , transition to Augmentin  and discharged home - Cultures from bronchoscopy did not show any organisms, fungal cultures still pending - No fevers - Leukocytosis improving - Currently on Zosyn  - I will switch him back to Augmentin -May need up to 8 weeks of Augmentin  - Plan will be to repeat CT with contrast in about 2 to 3 weeks and be  followed as outpatient -Previous bronchoscopy did not show any endobronchial process to suggest a postobstructive disease  Underlying chronic obstructive pulmonary disease - Continue bronchodilator treatment  History of pulmonary emboli - Continue Eliquis   Past history of histoplasmosis that was treated in 2017  Stage I lung cancer in April 2016 had wedge resection, observation status by oncology Dr. Gatha  Hypertension on home antihypertensives  Appetite is improved with mirtazapine   Fatigue  Sacral pressure ulcer has been managed  Will need physical therapy as he was so weak he could not walk around at home  I placed orders for Augmentin  Placed orders orders for repeat CT in 2 to 3 weeks and follow-up office visit after his CT Placed orders for office follow-up  Will be available as needed  Jennet Epley, MD [facility] Pager: See Amion      "

## 2024-04-10 NOTE — Progress Notes (Signed)
 Physical Therapy Treatment Patient Details Name: Timothy Wood MRN: [id number] DOB: 02/07/1941 Today's Date: 04/10/2024   History of Present Illness 84 y/o male admitted on 04/08/24 with weakness. Yk:olwh Ca, back surgery, DVT, PE, COPD, anemia, lumbar compression fractures, falls    PT Comments  Pt attempting to get out of recliner despite instruction to call for assistance. Reports he needed to get over to window seat to retrieve his sweater. Mod A to rise from low recliner and Mod A to step over from recliner to bed. Pt abruptly released RW handles during transfer-required assistance to prevent fall after LOB. Pt apologetic stating he forgets and is used to being independent. Pt demonstrate high risk for falls and poor safety awareness when mobilizing. Patient will benefit from continued inpatient follow up therapy, <3 hours/day    If plan is discharge home, recommend the following: Assistance with cooking/housework;Assist for transportation;Help with stairs or ramp for entrance;A little help with walking and/or transfers;A lot of help with bathing/dressing/bathroom   Can travel by private vehicle     No  Equipment Recommendations  None recommended by PT    Recommendations for Other Services       Precautions / Restrictions Precautions Precautions: Fall Restrictions Weight Bearing Restrictions Per Provider Order: No     Mobility  Bed Mobility Overal bed mobility: Needs Assistance Bed Mobility: Sit to Supine     Supine to sit: Min assist Sit to supine: Contact guard assist        Transfers Overall transfer level: Needs assistance Equipment used: Rolling walker (2 wheels) Transfers: Sit to/from Stand Sit to Stand: Mod assist   Step pivot transfers: Mod assist       General transfer comment: Assist to rise, steady, control descent-multiple attempts from low recliner. Cues for safety, hand placement. Pt abruptly released RW handles during transition back to  bed-near LOB-required increased assistance to prevent fall and safely sit on bed.    Ambulation/Gait Ambulation/Gait assistance: Min assist, +2 safety/equipment Gait Distance (Feet): 3 Feet Assistive device: Rolling walker (2 wheels)         General Gait Details: Pt was able to take a few steps from bed over to recliner using RW. Assist to stabilize and safely manage RW. Limited by fatigue, weakness.   Stairs             Wheelchair Mobility     Tilt Bed    Modified Rankin (Stroke Patients Only)       Balance Overall balance assessment: Needs assistance, History of Falls         Standing balance support: Bilateral upper extremity supported, During functional activity, Reliant on assistive device for balance Standing balance-Leahy Scale: Poor                              Communication Communication Communication: Impaired Factors Affecting Communication: Hearing impaired  Cognition Arousal: Alert Behavior During Therapy: Impulsive   PT - Cognitive impairments: Safety/Judgement                       PT - Cognition Comments: poor safety awareness when mobilizing Following commands: Impaired Following commands impaired: Follows one step commands with increased time    Cueing Cueing Techniques: Verbal cues  Exercises      General Comments General comments (skin integrity, edema, etc.): VSS on RA      Pertinent Vitals/Pain Pain Assessment Pain Assessment: Faces Faces  Pain Scale: Hurts a little bit Pain Location: ;low back Pain Descriptors / Indicators: Aching Pain Intervention(s): Monitored during session, Repositioned    Home Living Family/patient expects to be discharged to:: Unsure Living Arrangements: Alone Available Help at Discharge: Family;Available PRN/intermittently Type of Home: House Home Access: Stairs to enter Entrance Stairs-Rails: None Entrance Stairs-Number of Steps: 1   Home Layout: One level Home  Equipment: Rollator (4 wheels);Shower seat;Grab bars - tub/shower      Prior Function            PT Goals (current goals can now be found in the care plan section) Acute Rehab PT Goals Patient Stated Goal: none stated PT Goal Formulation: With patient Time For Goal Achievement: 04/24/24 Potential to Achieve Goals: Good Progress towards PT goals: Progressing toward goals    Frequency    Min 3X/week      PT Plan      Co-evaluation   Reason for Co-Treatment: To address functional/ADL transfers   OT goals addressed during session: ADL's and self-care;Strengthening/ROM      AM-PAC PT 6 Clicks Mobility   Outcome Measure  Help needed turning from your back to your side while in a flat bed without using bedrails?: A Little Help needed moving from lying on your back to sitting on the side of a flat bed without using bedrails?: A Little Help needed moving to and from a bed to a chair (including a wheelchair)?: A Lot Help needed standing up from a chair using your arms (e.g., wheelchair or bedside chair)?: A Lot Help needed to walk in hospital room?: A Lot Help needed climbing 3-5 steps with a railing? : Total 6 Click Score: 13    End of Session Equipment Utilized During Treatment: Gait belt Activity Tolerance: Patient tolerated treatment well Patient left: in bed;with call bell/phone within reach;with bed alarm set   PT Visit Diagnosis: Muscle weakness (generalized) (M62.81);Difficulty in walking, not elsewhere classified (R26.2);History of falling (Z91.81)     Time: 8484-8476 PT Time Calculation (min) (ACUTE ONLY): 8 min  Charges:    $Therapeutic Activity: 8-22 mins PT General Charges $$ ACUTE PT VISIT: 1 Visit                         Dannial SQUIBB, PT Acute Rehabilitation  Office: [phone]

## 2024-04-10 NOTE — Assessment & Plan Note (Addendum)
 Clinically stable on baseline oxygen use of 3 L.  CT with concern of some increase in consolidation.  Improving leukocytosis today.  Pulmonary was consulted - Continue with Zosyn -will continue Zosyn  while in the hospital and patient can be discharged on Augmentin  per pulmonary. -Continue with supportive care -Patient will need a repeat CT chest in 3 weeks which was ordered by pulmonary. -Appreciate pulmonary recommendations

## 2024-04-10 NOTE — Assessment & Plan Note (Signed)
 Continue with Eliquis 

## 2024-04-10 NOTE — Plan of Care (Signed)
   Problem: Education: Goal: Knowledge of General Education information will improve Description Including pain rating scale, medication(s)/side effects and non-pharmacologic comfort measures Outcome: Progressing

## 2024-04-10 NOTE — Evaluation (Signed)
 Occupational Therapy Evaluation Patient Details Name: Timothy Wood MRN: [id number] DOB: April 08, 1940 Today's Date: 04/10/2024   History of Present Illness   84 y/o male admitted on 04/08/24 with weakness. Yk:olwh Ca, back surgery, DVT, PE, COPD, anemia, lumbar compression fractures, falls     Clinical Impressions Pt admitted with the above concerns. Pt currently with functional limitations due to the deficits listed below (see OT Problem List). Prior to admit, pt reports that he lives alone and is independent-Mod I with ADL's and functional mobility. Reports not being able to ambulate for the past 3 weeks d/t fatigue/weakness.  Pt will benefit from acute skilled OT to increase their safety and independence with ADL and functional mobility for ADL to facilitate discharge. Patient will benefit from continued inpatient follow up therapy, <3 hours/day.       If plan is discharge home, recommend the following:   A little help with walking and/or transfers;A lot of help with bathing/dressing/bathroom;Assist for transportation;Assistance with cooking/housework     Functional Status Assessment   Patient has had a recent decline in their functional status and demonstrates the ability to make significant improvements in function in a reasonable and predictable amount of time.     Equipment Recommendations   BSC/3in1      Precautions/Restrictions   Precautions Precautions: Fall Recall of Precautions/Restrictions: Intact Restrictions Weight Bearing Restrictions Per Provider Order: No     Mobility Bed Mobility Overal bed mobility: Needs Assistance Bed Mobility: Supine to Sit     Supine to sit: Min assist, HOB elevated     General bed mobility comments: Unable to scoot hips towards EOB, requiring physical assistance with use of bed linen.    Transfers Overall transfer level: Needs assistance Equipment used: Rolling walker (2 wheels) Transfers: Sit to/from Stand, Bed  to chair/wheelchair/BSC Sit to Stand: Min assist, +2 safety/equipment     Step pivot transfers: Min assist     General transfer comment: Assist to rise, steady, control descent. Cues for safety and hand placement during RW management prior to sit to stand transition.      Balance Overall balance assessment: Needs assistance, History of Falls Sitting-balance support: Bilateral upper extremity supported, Feet supported Sitting balance-Leahy Scale: Poor Sitting balance - Comments: flucuated between Fair and Poor sitting balance d/t fatigue.   Standing balance support: Bilateral upper extremity supported, During functional activity, Reliant on assistive device for balance Standing balance-Leahy Scale: Poor          ADL either performed or assessed with clinical judgement   ADL       Grooming: Set up;Bed level   Upper Body Bathing: Set up;Sitting   Lower Body Bathing: Moderate assistance;Sit to/from stand   Upper Body Dressing : Set up;Sitting   Lower Body Dressing: Minimal assistance;Bed level (Able to donn hospital socks in bed with increased time using figure 4 positioning with legs)   Toilet Transfer: Minimal assistance;+2 for safety/equipment;+2 for physical assistance;Rolling walker (2 wheels)   Toileting- Clothing Manipulation and Hygiene: Maximal assistance;Sit to/from stand          Vision Baseline Vision/History: 1 Wears glasses Ability to See in Adequate Light: 0 Adequate Patient Visual Report: No change from baseline Vision Assessment?: Wears glasses for reading     Perception Perception: Not tested       Praxis Praxis: Not tested       Pertinent Vitals/Pain Pain Assessment Pain Assessment: 0-10 Pain Score: 5  Pain Location: ;low back Pain Descriptors / Indicators: Aching, Constant  Pain Intervention(s): Limited activity within patient's tolerance, Monitored during session     Extremity/Trunk Assessment Upper Extremity Assessment Upper  Extremity Assessment: Generalized weakness;Right hand dominant   Lower Extremity Assessment Lower Extremity Assessment: Defer to PT evaluation   Cervical / Trunk Assessment Cervical / Trunk Assessment: Normal   Communication Communication Communication: Impaired Factors Affecting Communication: Hearing impaired   Cognition Arousal: Alert Behavior During Therapy: Impulsive Cognition: No family/caregiver present to determine baseline      OT - Cognition Comments: Pt unaware that bedding was wet with urine.    Following commands: Impaired Following commands impaired: Follows one step commands with increased time     Cueing  General Comments   Cueing Techniques: Verbal cues  VSS on RA           Home Living Family/patient expects to be discharged to:: Unsure Living Arrangements: Alone Available Help at Discharge: Family;Available PRN/intermittently Type of Home: House Home Access: Stairs to enter [employer] of Steps: 1 Entrance Stairs-Rails: None Home Layout: One level     Bathroom Shower/Tub: (P) Tub/shower unit   Bathroom Toilet: (P) Standard     Home Equipment: Rollator (4 wheels);Shower seat;Grab bars - tub/shower          Prior Functioning/Environment Prior Level of Function : Independent/Modified Independent    Mobility Comments: has rollator available for use. having difficulty mobilizing for last couple of weeks ADLs Comments: (P) hasn't gotten in the shower for the past week. Normally independent.    OT Problem List: Decreased activity tolerance;Decreased safety awareness;Impaired balance (sitting and/or standing);Decreased strength   OT Treatment/Interventions: Self-care/ADL training;Therapeutic exercise;Therapeutic activities;Energy conservation;Patient/family education;DME and/or AE instruction;Balance training      OT Goals(Current goals can be found in the care plan section)   Acute Rehab OT Goals Patient Stated Goal: to sit  up in recliner OT Goal Formulation: With patient Time For Goal Achievement: 04/24/24 Potential to Achieve Goals: Good   OT Frequency:  Min 2X/week    Co-evaluation PT/OT/SLP Co-Evaluation/Treatment: Yes Reason for Co-Treatment: To address functional/ADL transfers   OT goals addressed during session: ADL's and self-care;Strengthening/ROM      AM-PAC OT 6 Clicks Daily Activity     Outcome Measure Help from another person eating meals?: None Help from another person taking care of personal grooming?: None Help from another person toileting, which includes using toliet, bedpan, or urinal?: A Lot Help from another person bathing (including washing, rinsing, drying)?: A Lot Help from another person to put on and taking off regular upper body clothing?: A Little Help from another person to put on and taking off regular lower body clothing?: A Little 6 Click Score: 18   End of Session Equipment Utilized During Treatment: Gait belt;Rolling walker (2 wheels)  Activity Tolerance: Patient limited by fatigue Patient left: in chair  OT Visit Diagnosis: Muscle weakness (generalized) (M62.81)                Time: 8658-8595 OT Time Calculation (min): 23 min Charges:  OT General Charges $OT Visit: 1 Visit OT Evaluation $OT Eval Low Complexity: 1 Low OT Treatments $Self Care/Home Management : 8-22 mins  Leita Howell, OTR/L,CBIS  Supplemental OT - [facility] and [facility] Secure Chat Preferred    Oran Dillenburg, Leita BIRCH 04/10/2024, 3:05 PM

## 2024-04-11 DIAGNOSIS — J449 Chronic obstructive pulmonary disease, unspecified: Secondary | ICD-10-CM | POA: Diagnosis not present

## 2024-04-11 DIAGNOSIS — J851 Abscess of lung with pneumonia: Secondary | ICD-10-CM | POA: Diagnosis not present

## 2024-04-11 LAB — GLUCOSE, CAPILLARY: Glucose-Capillary: 95 mg/dL (ref 70–99)

## 2024-04-11 MED ORDER — MELATONIN 3 MG PO TABS
3.0000 mg | ORAL_TABLET | Freq: Every day | ORAL | Status: DC
  Administered 2024-04-11 – 2024-04-13 (×4): 3 mg via ORAL
  Filled 2024-04-11 (×4): qty 1

## 2024-04-11 NOTE — Progress Notes (Signed)
" °   04/10/24 2340  Neurological  Neuro (WDL) X  Orientation Level Oriented to person;Disoriented to situation;Disoriented to place;Disoriented to time  Cognition Poor attention/concentration  Speech Clear  Neuro Symptoms Forgetful   Pt is getting more confuse than his baseline. Baseline was A/Ox4 yesterday, but he is A/Ox2 only and very forgetful now. On call provider has been notified.  "

## 2024-04-11 NOTE — Plan of Care (Signed)

## 2024-04-11 NOTE — [place] FL2 (Signed)
 " [place]  MEDICAID FL2 LEVEL OF CARE FORM     IDENTIFICATION  Patient Name: Timothy Wood Birthdate: 08-Nov-1940 Sex: male Admission Date (Current Location): 04/08/2024  [facility] and [place] Number:  Producer, Television/film/video and Address:  [facility],  [address], [place] [address]      Provider Number: [id number]  Attending Physician Name and Address:  Caleen Qualia, MD  Relative Name and Phone Number:  Rosaline Lever [phone]    Current Level of Care: Hospital Recommended Level of Care: Skilled Nursing Facility Prior Approval Number:    Date Approved/Denied:   PASRR Number: [id number]  Discharge Plan: SNF    Current Diagnoses: Patient Active Problem List   Diagnosis Date Noted   Hyperglycemia 04/09/2024   Pressure injury of skin 04/08/2024   Abscess of left lung with pneumonia (HCC) 03/23/2024   Lung abscess (HCC) 03/23/2024   Tobacco dependence due to cigarettes 04/27/2023   Abnormal CK 04/25/2023   Traumatic rhabdomyolysis 04/22/2023   Rhabdomyolysis 04/22/2023   Alcohol dependence, uncomplicated (HCC) 07/01/2022   Hypomagnesemia 07/01/2022   Rash and nonspecific skin eruption 07/01/2022   Pulmonary embolism (HCC) 06/04/2022   Fall at home, initial encounter 06/03/2022   Leukocytosis 06/03/2022   Hypocalcemia 06/03/2022   History of DVT (deep vein thrombosis) 06/03/2022   Aortic atherosclerosis 02/14/2021   Vitamin D deficiency, unspecified 04/18/2019   History of colonic polyps 02/01/2019   Screening for viral disease 02/01/2019   Nightmare disorder 08/12/2018   Chronic anticoagulation 04/04/2018   Angiodysplasia of cecum 11/23/2017   Actinic keratoses 10/01/2017   Unstable gait 06/08/2016   Genetic testing 04/29/2016   Ganglion of right wrist 04/28/2016   Depression, major, in partial remission 04/16/2016   Encounter for therapeutic drug monitoring 08/16/2015   Hand laceration    Closed compression fracture of L1 vertebra (HCC)     Hx of pulmonary emboli (HCC) 08/05/2015   Lung cancer (HCC) 08/05/2015   Syncope 08/05/2015   History of CAD (coronary artery disease) 08/05/2015   Kidney disease 08/05/2015   Bone lesion 08/05/2015   Prostate hypertrophy 08/05/2015   Hx of pulmonary embolus 08/05/2015   Complicated grieving 05/15/2015   Adenocarcinoma of right lung (HCC) 01/24/2015   Coronary atherosclerosis due to calcified coronary lesion of native artery 01/14/2015   Tremor 09/19/2014   COPD GOLD I 03/03/2013   Grieving 02/06/2013   Abnormal prostate on physical examination 08/01/2012   Polyposis coli - attenuated 09/12/2010   Peripheral vascular disease (HCC) 06/11/2008   Hyperlipemia, mixed 03/02/2008   SEBORRHEIC KERATOSIS 03/02/2008   BACK PAIN, LUMBAR, CHRONIC 09/30/2007   TOBACCO ABUSE 08/19/2007   Essential hypertension 08/19/2007   Multiple pulmonary nodules 08/19/2007    Orientation RESPIRATION BLADDER Height & Weight     Self  Normal Continent Weight: 62 kg Height:  5' 8 (172.7 cm)  BEHAVIORAL SYMPTOMS/MOOD NEUROLOGICAL BOWEL NUTRITION STATUS     (n/a) Continent Diet (Heart healthy)  AMBULATORY STATUS COMMUNICATION OF NEEDS Skin   Limited Assist Verbally Other (Comment) (redness noted to bilateral buttocks)                       Personal Care Assistance Level of Assistance  Bathing, Feeding, Dressing, Total care   Feeding assistance: Limited assistance Dressing Assistance: Maximum assistance Total Care Assistance:  (n/a)   Functional Limitations Info  Sight, Hearing, Speech Sight Info: Adequate Hearing Info: Impaired Speech Info: Adequate    SPECIAL CARE FACTORS FREQUENCY  PT (By licensed PT), OT (By licensed OT)     PT Frequency: 5x/wk OT Frequency: 5x/wk            Contractures Contractures Info: Not present    Additional Factors Info  Code Status, Allergies, Psychotropic, Insulin Sliding Scale, Isolation Precautions, Suctioning Needs Code Status Info:  DNR Allergies Info: Codeine, Hydrocodone  Psychotropic Info: see d/c summary Insulin Sliding Scale Info: see d/c summary Isolation Precautions Info: see d/c summary Suctioning Needs: n/a   Current Medications (04/11/2024):  This is the current hospital active medication list Current Facility-Administered Medications  Medication Dose Route Frequency Provider Last Rate Last Admin   acetaminophen  (TYLENOL ) tablet 650 mg  650 mg Oral Q6H PRN Celinda Alm Lot, MD       Or   acetaminophen  (TYLENOL ) suppository 650 mg  650 mg Rectal Q6H PRN Celinda Alm Lot, MD       albuterol  (PROVENTIL ) (2.5 MG/3ML) 0.083% nebulizer solution 2.5 mg  2.5 mg Nebulization Q4H PRN Celinda Alm Lot, MD       amLODipine  (NORVASC ) tablet 5 mg  5 mg Oral Daily Celinda Alm Lot, MD   5 mg at 04/11/24 0840   amoxicillin -clavulanate (AUGMENTIN ) 875-125 MG per tablet 1 tablet  1 tablet Oral Q12H Olalere, Adewale A, MD   1 tablet at 04/11/24 0840   apixaban  (ELIQUIS ) tablet 5 mg  5 mg Oral BID Celinda Alm Lot, MD   5 mg at 04/11/24 9158   carvedilol  (COREG ) tablet 6.25 mg  6.25 mg Oral BID WC Celinda Alm Lot, MD   6.25 mg at 04/11/24 1612   collagenase  (SANTYL ) ointment 1 Application  1 Application Topical Daily Amin, Sumayya, MD   1 Application at 04/11/24 0844   DULoxetine  (CYMBALTA ) DR capsule 60 mg  60 mg Oral Daily Celinda Alm Lot, MD   60 mg at 04/11/24 0840   feeding supplement (ENSURE PLUS HIGH PROTEIN) liquid 237 mL  237 mL Oral BID BM Amin, Sumayya, MD   237 mL at 04/11/24 0854   folic acid  (FOLVITE ) tablet 1 mg  1 mg Oral Daily Celinda Alm Lot, MD   1 mg at 04/11/24 0840   irbesartan  (AVAPRO ) tablet 150 mg  150 mg Oral Daily Celinda Alm Lot, MD   150 mg at 04/11/24 0841   liver oil-zinc  oxide (DESITIN) 40 % ointment 1 Application  1 Application Topical BID Amin, Sumayya, MD   1 Application at 04/11/24 0844   magnesium  oxide (MAG-OX) tablet 400 mg  400 mg Oral Daily Celinda Alm Lot,  MD   400 mg at 04/11/24 0840   melatonin tablet 3 mg  3 mg Oral QHS [place] Isaiah CROME, NP   3 mg at 04/11/24 0211   mirtazapine  (REMERON ) tablet 7.5 mg  7.5 mg Oral QHS Celinda Alm Lot, MD   7.5 mg at 04/10/24 2148   multivitamin (PROSIGHT) tablet 1 tablet  1 tablet Oral Daily Celinda Alm Lot, MD   1 tablet at 04/11/24 0840   ondansetron  (ZOFRAN ) tablet 4 mg  4 mg Oral Q6H PRN Celinda Alm Lot, MD       Or   ondansetron  (ZOFRAN ) injection 4 mg  4 mg Intravenous Q6H PRN Celinda Alm Lot, MD       pantoprazole  (PROTONIX ) EC tablet 40 mg  40 mg Oral Daily Celinda Alm Lot, MD   40 mg at 04/11/24 0840   rosuvastatin  (CRESTOR ) tablet 20 mg  20 mg Oral Daily Celinda Alm Lot, MD  20 mg at 04/11/24 0841   thiamine  (VITAMIN B1) tablet 100 mg  100 mg Oral Daily Celinda Alm Lot, MD   100 mg at 04/11/24 9158     Discharge Medications: Please see discharge summary for a list of discharge medications.  Relevant Imaging Results:  Relevant Lab Results:   Additional Information ss#[id number].  Toy LITTIE Agar, RN     "

## 2024-04-11 NOTE — TOC Initial Note (Addendum)
 Transition of Care [facility]) - Initial/Assessment Note    Patient Details  Name: Timothy Wood MRN: [id number] Date of Birth: 11-16-1940  [facility] CM/SW Contact:    Toy LITTIE Agar, RN Phone Number:[phone]  04/11/2024, 4:21 PM  Clinical Narrative:                 Inpatient care manager following patient for short term SNF for rehab. Patient is confused and unable to participate in assessment. CM has attempted to contact daughter  Rosaline Lever [phone] no answer. CM to initiate SNF bed search.   69 Cm spoke with daughter Rosaline who gives CM consent to initiate bed search. FL2 faxed out PASRR # [id number]  Expected Discharge Plan: Skilled Nursing Facility Barriers to Discharge: SNF Pending bed offer, Continued Medical Work up   Patient Goals and CMS Choice Patient states their goals for this hospitalization and ongoing recovery are:: Patient is confused CMS Medicare.gov Compare Post Acute Care list provided to:: Patient Represenative (must comment) (daughter Rosaline Lever [phone]) Choice offered to / list presented to : Adult Children [place] ownership interest in [facility].provided to:: Adult Children    Expected Discharge Plan and Services In-house Referral: NA Discharge Planning Services: CM Consult Post Acute Care Choice: Skilled Nursing Facility Living arrangements for the past 2 months: Single Family Home                 DME Arranged: N/A DME Agency: NA       HH Arranged: NA HH Agency: NA        Prior Living Arrangements/Services Living arrangements for the past 2 months: Single Family Home Lives with:: Self Patient language and need for interpreter reviewed:: Yes Do you feel safe going back to the place where you live?: Yes      Need for Family Participation in Patient Care: Yes (Comment) Care giver support system in place?: Yes (comment) Current home services: DME (walker) Criminal Activity/Legal  Involvement Pertinent to Current Situation/Hospitalization: No - Comment as needed  Activities of Daily Living   ADL Screening (condition at time of admission) Independently performs ADLs?: Yes (appropriate for developmental age) Is the patient deaf or have difficulty hearing?: No Does the patient have difficulty seeing, even when wearing glasses/contacts?: No Does the patient have difficulty concentrating, remembering, or making decisions?: No  Permission Sought/Granted Permission sought to share information with : Family Supports Permission granted to share information with : No (patient confused contact from chart)  Share Information with NAME: Rosaline Lever daughter [phone]     Permission granted to share info w Relationship: daughter  Permission granted to share info w Contact Information: [phone]  Emotional Assessment Appearance:: Appears stated age Attitude/Demeanor/Rapport: Unable to Assess Affect (typically observed): Unable to Assess Orientation: : Oriented to Self Alcohol / Substance Use: Not Applicable Psych Involvement: No (comment)  Admission diagnosis:  Lung abscess (HCC) [J85.2] Abscess of left lung with pneumonia, unspecified part of lung (HCC) [J85.1] Patient Active Problem List   Diagnosis Date Noted   Hyperglycemia 04/09/2024   Pressure injury of skin 04/08/2024   Abscess of left lung with pneumonia (HCC) 03/23/2024   Lung abscess (HCC) 03/23/2024   Tobacco dependence due to cigarettes 04/27/2023   Abnormal CK 04/25/2023   Traumatic rhabdomyolysis 04/22/2023   Rhabdomyolysis 04/22/2023   Alcohol dependence, uncomplicated (HCC) 07/01/2022   Hypomagnesemia 07/01/2022   Rash and nonspecific skin eruption 07/01/2022   Pulmonary embolism (HCC) 06/04/2022   Fall at home,  initial encounter 06/03/2022   Leukocytosis 06/03/2022   Hypocalcemia 06/03/2022   History of DVT (deep vein thrombosis) 06/03/2022   Aortic atherosclerosis 02/14/2021   Vitamin  D deficiency, unspecified 04/18/2019   History of colonic polyps 02/01/2019   Screening for viral disease 02/01/2019   Nightmare disorder 08/12/2018   Chronic anticoagulation 04/04/2018   Angiodysplasia of cecum 11/23/2017   Actinic keratoses 10/01/2017   Unstable gait 06/08/2016   Genetic testing 04/29/2016   Ganglion of right wrist 04/28/2016   Depression, major, in partial remission 04/16/2016   Encounter for therapeutic drug monitoring 08/16/2015   Hand laceration    Closed compression fracture of L1 vertebra (HCC)    Hx of pulmonary emboli (HCC) 08/05/2015   Lung cancer (HCC) 08/05/2015   Syncope 08/05/2015   History of CAD (coronary artery disease) 08/05/2015   Kidney disease 08/05/2015   Bone lesion 08/05/2015   Prostate hypertrophy 08/05/2015   Hx of pulmonary embolus 08/05/2015   Complicated grieving 05/15/2015   Adenocarcinoma of right lung (HCC) 01/24/2015   Coronary atherosclerosis due to calcified coronary lesion of native artery 01/14/2015   Tremor 09/19/2014   COPD GOLD I 03/03/2013   Grieving 02/06/2013   Abnormal prostate on physical examination 08/01/2012   Polyposis coli - attenuated 09/12/2010   Peripheral vascular disease (HCC) 06/11/2008   Hyperlipemia, mixed 03/02/2008   SEBORRHEIC KERATOSIS 03/02/2008   BACK PAIN, LUMBAR, CHRONIC 09/30/2007   TOBACCO ABUSE 08/19/2007   Essential hypertension 08/19/2007   Multiple pulmonary nodules 08/19/2007   PCP:  Jordan, [name], MD Pharmacy:   [facility] [id number] - [address], [place] - [address] [place] [address] Phone: [phone] Fax: [phone]  [facility] [place], [place] - [address] [place] [address] Phone: [phone] Fax: [phone]  [facility] - [facility] [address] [place] [address] Phone: [phone] Fax: [phone]     Social Drivers of Health (SDOH) Social History: SDOH Screenings    Food Insecurity: No Food Insecurity (04/08/2024)  Housing: Low Risk (04/08/2024)  Transportation Needs: No Transportation Needs (04/08/2024)  Utilities: Not At Risk (04/08/2024)  Depression (PHQ2-9): High Risk (04/14/2023)  Financial Resource Strain: Low Risk (11/03/2021)  Physical Activity: Inactive (11/03/2021)  Social Connections: Socially Isolated (04/08/2024)  Stress: Stress Concern Present (11/03/2021)  Tobacco Use: High Risk (04/08/2024)   SDOH Interventions:     Readmission Risk Interventions     No data to display

## 2024-04-11 NOTE — Progress Notes (Signed)
 " Progress Note   Patient: Timothy Wood FMW:[id number] DOB: [birth year] DOA: 04/08/2024     3 DOS: the patient was seen and examined on 04/11/2024   Brief hospital course: Partly taken from prior notes.  Timothy Wood is a 84 y.o. male with medical history significant of stage I lung adenocarcinoma, C, nodular dysplasia, osteoarthritis, blood clotting disorder, cataract, history of DVT, peripheral vascular disease, depression, diverticulosis, GERD,  hyperlipidemia hypertension, lumbar compression fracture, seizures while in ICU years ago without further episodes since then, sinusitis, pulmonary nodule, histoplasmosis with pneumonia, COPD, history of pneumonia who was recently admitted and discharged for 3 days earlier this month due to lung abscess who is coming to the emergency department due to generalized weakness, fatigue and dyspnea.  He has been having significant difficulty just trying to get out of bed.  He was initially treated with Zosyn  and has been taking Augmentin  at home.    On presentation vital stable, labs with persistent leukocytosis and mildly elevated alkaline phosphatase at 168.  Chest x-ray with mild improvement in left lung airspace disease with persistent consolidation.  CT chest without contrast with increased airspace disease and consolidation in the left upper and lower lobes.  Small left pleural effusion and emphysema.  Zosyn  was resumed, cultures were sent.  Pulmonary was consulted.  1/25: Hemodynamically stable, small improvement in leukocytosis.  Patient has BAL on 03/24/2024 with no organisms.  PT and OT evaluation ordered.  1/26: Remained hemodynamically stable, improving leukocytosis and CRP at 8.2, A1c of 5.9.  Pending PT and OT evaluation for disposition.  1/27: Remained hemodynamically stable.  PT is recommending SNF-TOC to work on placement.  Assessment and Plan: * Lung abscess (HCC) Clinically stable on baseline oxygen use of 3 L.  CT with concern of  some increase in consolidation.  Improving leukocytosis today.  Pulmonary was consulted - Continue with Zosyn -will continue Zosyn  while in the hospital and patient can be discharged on Augmentin  per pulmonary. -Continue with supportive care -Patient will need a repeat CT chest in 3 weeks which was ordered by pulmonary. -Appreciate pulmonary recommendations  COPD GOLD I Patient was on 2 L off oxygen without any documented hypoxia. No wheezing  -Continue with bronchodilator -Wean from oxygen as tolerated  Hx of pulmonary emboli (HCC) - Continue with Eliquis   Essential hypertension Blood pressure currently within goal. -Continue home amlodipine , irbesartan  and Coreg   History of CAD (coronary artery disease) No chest pain or acute concern. - Continue home Eliquis , beta-blocker and statin  Hypomagnesemia - Continuing home magnesium  supplement  Hyperlipemia, mixed - Continue home statin  Depression, major, in partial remission - Continuing home duloxetine  and mirtazapine   Pressure injury of skin Patient with history of pressure injuries-present on admission. - Wound care consult  Tobacco dependence due to cigarettes - Nicotine  patch as needed  Hyperglycemia Mild hyperglycemia noted yesterday, no history of diabetes.  Blood glucose normal this morning. - A1c pending      Subjective: Patient was resting comfortably when seen today.  No new nursing concern.  Physical Exam: Vitals:   04/10/24 1441 04/10/24 2104 04/11/24 0529 04/11/24 0840  BP: 99/65 (!) 112/48 110/72 108/68  Pulse: 83 70 82   Resp: 16 18 18    Temp:  97.8 F (36.6 C) (!) 97.5 F (36.4 C)   TempSrc:  Oral Oral   SpO2: 94% 95% 92%   Weight:      Height:       General.  Frail and malnourished elderly  man, in no acute distress. Pulmonary.  Lungs clear bilaterally, normal respiratory effort. CV.  Regular rate and rhythm, no JVD, rub or murmur. Abdomen.  Soft, nontender, nondistended, BS  positive. CNS.  Sleeping, no apparent deficit. Extremities.  No edema,  pulses intact and symmetrical.   Data Reviewed: Prior data reviewed  Family Communication: Tried calling daughter with no response, no voicemail left on a generic service.  Disposition: Status is: Inpatient Remains inpatient appropriate because: Severity of illness  Planned Discharge Destination: SNF  DVT prophylaxis.  Eliquis  Time spent: 45 minutes  This record has been created using Conservation officer, historic buildings. Errors have been sought and corrected,but may not always be located. Such creation errors do not reflect on the standard of care.   Author: Amaryllis Dare, MD 04/11/2024 12:44 PM  For on call review [email or web address].  "

## 2024-04-12 DIAGNOSIS — J851 Abscess of lung with pneumonia: Secondary | ICD-10-CM | POA: Diagnosis not present

## 2024-04-12 NOTE — Progress Notes (Signed)
 " PROGRESS NOTE    Timothy Wood  FMW:[id number] DOB: 03-Jul-1940 DOA: 04/08/2024 PCP: Jordan, [name], MD   Brief Narrative: This 84 yrs old Male with medical history significant for stage I lung adenocarcinoma, C, nodular dysplasia, osteoarthritis, blood clotting disorder, cataract, history of DVT, peripheral vascular disease, depression, diverticulosis, GERD,  hyperlipidemia,  hypertension, lumbar compression fracture, seizures while in ICU years ago without further episodes since then, sinusitis, pulmonary nodule, histoplasmosis with pneumonia, COPD, history of pneumonia who was recently admitted and discharged  3 days earlier this month due to lung abscess who is coming back to the emergency department due to generalized weakness, fatigue and dyspnea. He has been having significant difficulty just trying to get out of bed.  He was initially treated with Zosyn  and has been taking Augmentin  at home.   He has persistent leukocytosis and mildly elevated alkaline phosphatase at 168.  Chest x-ray with mild improvement in left lung airspace disease with persistent consolidation.  CT chest without contrast with increased airspace disease and consolidation in the left upper and lower lobes.  Small left pleural effusion and emphysema.  Zosyn  was resumed, cultures were sent.  Pulmonary was consulted.   1/25: Hemodynamically stable, small improvement in leukocytosis.  Patient has BAL on 03/24/2024 with no organisms.   1/26: Remained hemodynamically stable, improving leukocytosis and CRP at 8.2, A1c of 5.9.  1/27: Remained hemodynamically stable.  PT is recommending SNF-TOC to work on placement.  Assessment & Plan:   Principal Problem:   Lung abscess (HCC) Active Problems:   COPD GOLD I   Hx of pulmonary emboli (HCC)   Essential hypertension   History of CAD (coronary artery disease)   Hypomagnesemia   Hyperlipemia, mixed   Depression, major, in partial remission   Pressure injury of skin   Tobacco  dependence due to cigarettes   Hyperglycemia  Lung abscess [facility]): He is clinically stable on baseline oxygen use of 3 L.   CT with concern of some increase in consolidation.  Improving leukocytosis .  Pulmonary was consulted -Continue with Zosyn -will continue Zosyn  while in the hospital and patient can be discharged on Augmentin  per pulmonary. -Continue with supportive care. -Patient will need a repeat CT chest in 3 weeks which was ordered by pulmonary. -Appreciate pulmonary recommendations.   COPD GOLD I: Patient was on 2 L off oxygen without any documented hypoxia. No wheezing noted on physical exam. Continue with bronchodilator Wean Oxygen as tolerated.   Hx of pulmonary emboli (HCC): Continue with Eliquis .   Essential hypertension: Blood pressure currently within goal. Continue home amlodipine , irbesartan  and Coreg .   History of CAD (coronary artery disease) No chest pain or acute concern. Continue Eliquis , beta-blocker and statin.   Hypomagnesemia: - Continuing magnesium  supplementation.   Hyperlipemia, mixed: - Continue statin.   Depression, major, in partial remission: - Continuing duloxetine  and mirtazapine .   Pressure injury of skin: Patient with history of pressure injuries-present on admission. - Wound care consult.   Tobacco dependence due to cigarettes: - Nicotine  patch as needed.  counseling completed.   Hyperglycemia: Mild hyperglycemia noted yesterday, no history of diabetes.  Blood glucose normal this morning. - Hb A1C 5.9     DVT prophylaxis: Eliquis  Code Status: Full code Family Communication: No family at bed side Disposition Plan:     Status is: Inpatient Remains inpatient appropriate because: Patient admitted for lung abscess getting IV antibiotics.  PCCM is consulted.  Patient medically ready for discharge,  pending SNF pending  placement    Consultants:  Pulmonology  Procedures:   Antimicrobials:  Anti-infectives (From  admission, onward)    Start     Dose/Rate Route Frequency Ordered Stop   04/10/24 1030  amoxicillin -clavulanate (AUGMENTIN ) 875-125 MG per tablet 1 tablet        1 tablet Oral Every 12 hours 04/10/24 0944     04/08/24 2200  piperacillin -tazobactam (ZOSYN ) IVPB 3.375 g  Status:  Discontinued        3.375 g 12.5 mL/hr over 240 Minutes Intravenous Every 8 hours 04/08/24 1548 04/10/24 0944   04/08/24 1600  piperacillin -tazobactam (ZOSYN ) IVPB 3.375 g        3.375 g 100 mL/hr over 30 Minutes Intravenous  Once 04/08/24 1543 04/08/24 1752      Subjective: Patient was seen and examined at bedside.  Overnight events noted. Patient lying comfortably in bed,  denies any chest pain,  reports feeling better.   He remains on 4 L of supplemental oxygen.  Objective: Vitals:   04/11/24 1402 04/11/24 2013 04/12/24 0539 04/12/24 0811  BP: 105/61 (!) 97/53 (!) 109/57 107/61  Pulse: 78 81 89 (!) 104  Resp: 16 18 18    Temp: 98 F (36.7 C) 97.7 F (36.5 C) 97.8 F (36.6 C)   TempSrc:  Oral Oral   SpO2: 90% 91% 92% 98%  Weight:      Height:        Intake/Output Summary (Last 24 hours) at 04/12/2024 1245 Last data filed at 04/12/2024 0900 Gross per 24 hour  Intake 480 ml  Output 700 ml  Net -220 ml   Filed Weights   04/08/24 1048  Weight: 62 kg    Examination:  General exam: Appears calm and comfortable, not in any acute distress. Respiratory system: CTA Bilaterally. Respiratory effort normal.  RR 14 Cardiovascular system: S1 & S2 heard, RRR. No JVD, murmurs, rubs, gallops or clicks.  Gastrointestinal system: Abdomen is non distended, soft and non tender. Normal bowel sounds heard. Central nervous system: Alert and oriented x 3. No focal neurological deficits. Extremities: No edema, no cyanosis, no clubbing. Skin: No rashes, lesions or ulcers Psychiatry: Judgement and insight appear normal. Mood & affect appropriate.   Data Reviewed: I have personally reviewed following labs and  imaging studies  CBC: Recent Labs  Lab 04/08/24 1050 04/09/24 0631 04/10/24 0518  WBC 20.7* 16.0* 14.7*  NEUTROABS 18.2*  --   --   HGB 10.3* 10.6* 10.1*  HCT 33.4* 36.0* 34.0*  MCV 87.4 89.8 89.7  PLT 462* 428* 437*   Basic Metabolic Panel: Recent Labs  Lab 04/08/24 1050 04/09/24 0631  NA 134* 134*  K 3.5 3.7  CL 98 99  CO2 25 25  GLUCOSE 173* 89  BUN 11 8  CREATININE 0.60* 0.63  CALCIUM  8.6* 8.6*   GFR: Estimated Creatinine Clearance: 61.4 mL/min (by C-G formula based on SCr of 0.63 mg/dL). Liver Function Tests: Recent Labs  Lab 04/08/24 1050 04/09/24 0631  AST 25 22  ALT 31 27  ALKPHOS 168* 157*  BILITOT 0.3 0.3  PROT 6.1* 6.1*  ALBUMIN 2.9* 2.8*   No results for input(s): LIPASE, AMYLASE in the last 168 hours. No results for input(s): AMMONIA in the last 168 hours. Coagulation Profile: No results for input(s): INR, PROTIME in the last 168 hours. Cardiac Enzymes: No results for input(s): CKTOTAL, CKMB, CKMBINDEX, TROPONINI in the last 168 hours. BNP (last 3 results) No results for input(s): PROBNP in the last 8760 hours. HbA1C:  No results for input(s): HGBA1C in the last 72 hours. CBG: Recent Labs  Lab 04/08/24 1048 04/11/24 0022  GLUCAP 171* 95   Lipid Profile: No results for input(s): CHOL, HDL, LDLCALC, TRIG, CHOLHDL, LDLDIRECT in the last 72 hours. Thyroid  Function Tests: No results for input(s): TSH, T4TOTAL, FREET4, T3FREE, THYROIDAB in the last 72 hours. Anemia Panel: No results for input(s): VITAMINB12, FOLATE, FERRITIN, TIBC, IRON, RETICCTPCT in the last 72 hours. Sepsis Labs: Recent Labs  Lab 04/08/24 1105  LATICACIDVEN 1.5    Recent Results (from the past 240 hours)  Culture, blood (routine x 2)     Status: None (Preliminary result)   Collection Time: 04/08/24 10:50 AM   Specimen: BLOOD RIGHT FOREARM  Result Value Ref Range Status   Specimen Description   Final    BLOOD  RIGHT FOREARM Performed at [facility], [address], [place], [place] [address]    Special Requests   Final    BOTTLES DRAWN AEROBIC AND ANAEROBIC Blood Culture adequate volume Performed at [facility], [address] [place], [place] [address]    Culture   Final    NO GROWTH 4 DAYS Performed at [facility], [address] [place], [place] [address]    Report Status PENDING  Incomplete  Culture, blood (routine x 2)     Status: None (Preliminary result)   Collection Time: 04/08/24 11:04 AM   Specimen: BLOOD  Result Value Ref Range Status   Specimen Description   Final    BLOOD RIGHT ANTECUBITAL Performed at [facility], [address] [place], [place] [address]    Special Requests   Final    BOTTLES DRAWN AEROBIC AND ANAEROBIC Blood Culture adequate volume Performed at [facility], [address] [place], [place] [address]    Culture   Final    NO GROWTH 4 DAYS Performed at [facility], [address], [place], [place] [address]    Report Status PENDING  Incomplete    Radiology Studies: No results found.  Scheduled Meds:  amLODipine   5 mg Oral Daily   amoxicillin -clavulanate  1 tablet Oral Q12H   apixaban   5 mg Oral BID   carvedilol   6.25 mg Oral BID WC   collagenase   1 Application Topical Daily   DULoxetine   60 mg Oral Daily   feeding supplement  237 mL Oral BID BM   folic acid   1 mg Oral Daily   irbesartan   150 mg Oral Daily   liver oil-zinc  oxide  1 Application Topical BID   magnesium  oxide  400 mg Oral Daily   melatonin  3 mg Oral QHS   mirtazapine   7.5 mg Oral QHS   multivitamin  1 tablet Oral Daily   pantoprazole   40 mg Oral Daily   rosuvastatin   20 mg Oral Daily   thiamine   100 mg Oral Daily   Continuous Infusions:   LOS: 4 days    Time spent: 50 mins    Darcel Dawley, MD [facility]   If 7PM-7AM, please contact night-coverage  "

## 2024-04-12 NOTE — Plan of Care (Signed)
   Problem: Activity: Goal: Risk for activity intolerance will decrease Outcome: Progressing   Problem: Coping: Goal: Level of anxiety will decrease Outcome: Progressing   Problem: Safety: Goal: Ability to remain free from injury will improve Outcome: Progressing

## 2024-04-12 NOTE — TOC Progression Note (Addendum)
 Transition of Care [facility]) - Progression Note    Patient Details  Name: Timothy Wood MRN: [id number] Date of Birth: [birth year]  Transition of Care [facility]) CM/SW Contact  Toy LITTIE Agar, RN Phone Number:[phone]  04/12/2024, 1:52 PM  Clinical Narrative:    CM spoke with daughter [name] to present list of bed offers. Daughter would like to research offers and will call CM back in am. CM to initiate insurance auth with facility pending.   1447 Insurance auth initiated. Auth ID [id number]   Expected Discharge Plan: Skilled Nursing Facility Barriers to Discharge: SNF Pending bed offer, Continued Medical Work up               Expected Discharge Plan and Services In-house Referral: NA Discharge Planning Services: CM Consult Post Acute Care Choice: Skilled Nursing Facility Living arrangements for the past 2 months: Single Family Home                 DME Arranged: N/A DME Agency: NA       HH Arranged: NA HH Agency: NA         Social Drivers of Health (SDOH) Interventions SDOH Screenings   Food Insecurity: No Food Insecurity (04/08/2024)  Housing: Low Risk (04/08/2024)  Transportation Needs: No Transportation Needs (04/08/2024)  Utilities: Not At Risk (04/08/2024)  Depression (PHQ2-9): High Risk (04/14/2023)  Financial Resource Strain: Low Risk (11/03/2021)  Physical Activity: Inactive (11/03/2021)  Social Connections: Socially Isolated (04/08/2024)  Stress: Stress Concern Present (11/03/2021)  Tobacco Use: High Risk (04/08/2024)    Readmission Risk Interventions     No data to display

## 2024-04-12 NOTE — Progress Notes (Signed)
 Physical Therapy Treatment Patient Details Name: Timothy Wood MRN: [id number] DOB: 26-Aug-1940 Today's Date: 04/12/2024   History of Present Illness 84 y/o male admitted on 04/08/24 with weakness. Yk:olwh Ca, back surgery, DVT, PE, COPD, anemia, lumbar compression fractures, falls    PT Comments  AxO x 3 pleasant and willing.  Pt in bed on 3 lts at rest sats 95 and irregular HR 70.  No c/o's.  Assisted to EOB when Pt c/o max dizziness.  Took vitals: Supine BP 101/56  HR 70 sats on 3 lts 95% EOB    BP   66/52  HR 98  sats 92% on 3 lts   MAX c/o dizziness Entered vitals in EPIC and sent a message to nurse via secure chat. Pt returned to bed, unable to attempt any OOB activity.   Pt lives home alone at [employer] using his rollator.  LPT has rec Pt will need ST Rehab at SNF to address mobility and functional decline prior to safely returning home.    If plan is discharge home, recommend the following: Assistance with cooking/housework;Assist for transportation;Help with stairs or ramp for entrance;A little help with walking and/or transfers;A lot of help with bathing/dressing/bathroom   Can travel by private vehicle     No  Equipment Recommendations  None recommended by PT    Recommendations for Other Services       Precautions / Restrictions Precautions Precautions: Fall Precaution/Restrictions Comments: monitor vitals Restrictions Weight Bearing Restrictions Per Provider Order: No     Mobility  Bed Mobility   Bed Mobility: Supine to Sit, Sit to Supine     Supine to sit: Mod assist Sit to supine: Mod assist, Max assist   General bed mobility comments: assisted to EOB required much assist using bed using bed pad to compllete scooting.  Positive Orhtostatic.  Increased assist back to bed.    Transfers                   General transfer comment: unable    Ambulation/Gait               General Gait Details: unable   Stairs              Wheelchair Mobility     Tilt Bed    Modified Rankin (Stroke Patients Only)       Balance                                            Communication    Cognition Arousal: Alert Behavior During Therapy: WFL for tasks assessed/performed   PT - Cognitive impairments: No apparent impairments                       PT - Cognition Comments: AxO x 3 pleasant and willing.  Joking. Following commands: Intact Following commands impaired: Follows one step commands with increased time    Cueing Cueing Techniques: Verbal cues  Exercises      General Comments        Pertinent Vitals/Pain Pain Assessment Pain Assessment: No/denies pain    Home Living                          Prior Function            PT Goals (current goals  can now be found in the care plan section) Progress towards PT goals: Progressing toward goals    Frequency    Min 3X/week      PT Plan      Co-evaluation              AM-PAC PT 6 Clicks Mobility   Outcome Measure  Help needed turning from your back to your side while in a flat bed without using bedrails?: A Lot Help needed moving from lying on your back to sitting on the side of a flat bed without using bedrails?: A Lot Help needed moving to and from a bed to a chair (including a wheelchair)?: A Lot Help needed standing up from a chair using your arms (e.g., wheelchair or bedside chair)?: A Lot Help needed to walk in hospital room?: A Lot Help needed climbing 3-5 steps with a railing? : A Lot 6 Click Score: 12    End of Session   Activity Tolerance: Treatment limited secondary to medical complications (Comment) (Orthostatic) Patient left: in bed;with call bell/phone within reach;with bed alarm set Nurse Communication: Mobility status PT Visit Diagnosis: Muscle weakness (generalized) (M62.81);Difficulty in walking, not elsewhere classified (R26.2);History of falling (Z91.81)     Time:  8591-8573 PT Time Calculation (min) (ACUTE ONLY): 18 min  Charges:    $Therapeutic Activity: 8-22 mins PT General Charges $$ ACUTE PT VISIT: 1 Visit                     {Charolette Bultman  PTA Acute  Rehabilitation Services Office M-F          [phone]

## 2024-04-13 DIAGNOSIS — J851 Abscess of lung with pneumonia: Secondary | ICD-10-CM | POA: Diagnosis not present

## 2024-04-13 LAB — BASIC METABOLIC PANEL WITH GFR
Anion gap: 11 (ref 5–15)
BUN: 13 mg/dL (ref 8–23)
CO2: 26 mmol/L (ref 22–32)
Calcium: 8.7 mg/dL — ABNORMAL LOW (ref 8.9–10.3)
Chloride: 96 mmol/L — ABNORMAL LOW (ref 98–111)
Creatinine, Ser: 0.71 mg/dL (ref 0.61–1.24)
GFR, Estimated: >60 mL/min
Glucose, Bld: 120 mg/dL — ABNORMAL HIGH (ref 70–99)
Potassium: 3.9 mmol/L (ref 3.5–5.1)
Sodium: 133 mmol/L — ABNORMAL LOW (ref 135–145)

## 2024-04-13 LAB — CBC
HCT: 34.9 % — ABNORMAL LOW (ref 39.0–52.0)
Hemoglobin: 10.6 g/dL — ABNORMAL LOW (ref 13.0–17.0)
MCH: 26.6 pg (ref 26.0–34.0)
MCHC: 30.4 g/dL (ref 30.0–36.0)
MCV: 87.7 fL (ref 80.0–100.0)
Platelets: 454 10*3/uL — ABNORMAL HIGH (ref 150–400)
RBC: 3.98 MIL/uL — ABNORMAL LOW (ref 4.22–5.81)
RDW: 15 % (ref 11.5–15.5)
WBC: 15.7 10*3/uL — ABNORMAL HIGH (ref 4.0–10.5)
nRBC: 0 % (ref 0.0–0.2)

## 2024-04-13 LAB — PHOSPHORUS: Phosphorus: 2.4 mg/dL — ABNORMAL LOW (ref 2.5–4.6)

## 2024-04-13 LAB — CULTURE, BLOOD (ROUTINE X 2)
Culture: NO GROWTH
Culture: NO GROWTH
Special Requests: ADEQUATE
Special Requests: ADEQUATE

## 2024-04-13 LAB — MAGNESIUM: Magnesium: 1.7 mg/dL (ref 1.7–2.4)

## 2024-04-13 MED ORDER — SODIUM CHLORIDE 0.9 % IV BOLUS
500.0000 mL | Freq: Once | INTRAVENOUS | Status: AC
  Administered 2024-04-13: 500 mL via INTRAVENOUS

## 2024-04-13 NOTE — Plan of Care (Signed)
  Problem: Clinical Measurements: Goal: Ability to maintain clinical measurements within normal limits will improve Outcome: Progressing   Problem: Nutrition: Goal: Adequate nutrition will be maintained Outcome: Progressing   Problem: Coping: Goal: Level of anxiety will decrease Outcome: Progressing   Problem: Pain Managment: Goal: General experience of comfort will improve and/or be controlled Outcome: Progressing   Problem: Skin Integrity: Goal: Risk for impaired skin integrity will decrease Outcome: Progressing

## 2024-04-13 NOTE — Plan of Care (Signed)
   Problem: Activity: Goal: Risk for activity intolerance will decrease Outcome: Progressing   Problem: Nutrition: Goal: Adequate nutrition will be maintained Outcome: Progressing   Problem: Coping: Goal: Level of anxiety will decrease Outcome: Progressing   Problem: Safety: Goal: Ability to remain free from injury will improve Outcome: Progressing

## 2024-04-13 NOTE — TOC Progression Note (Addendum)
 Transition of Care [facility]) - Progression Note    Patient Details  Name: Timothy Wood MRN: [id number] Date of Birth: 12-Dec-1940  Transition of Care [facility]) CM/SW Contact  Toy LITTIE Agar, RN Phone Number:[phone]  04/13/2024, 1:43 PM  Clinical Narrative:    Per daughter Timothy Wood) choice for placement is [facility]. CM has sent message to Tammy with [facility] to determine if there is a bed available. Awaiting confirmation of bed availability.   [facility] has bed available. Insurance auth to be updated with facility choice.    Expected Discharge Plan: Skilled Nursing Facility Barriers to Discharge: SNF Pending bed offer, Continued Medical Work up               Expected Discharge Plan and Services In-house Referral: NA Discharge Planning Services: CM Consult Post Acute Care Choice: Skilled Nursing Facility Living arrangements for the past 2 months: Single Family Home                 DME Arranged: N/A DME Agency: NA       HH Arranged: NA HH Agency: NA         Social Drivers of Health (SDOH) Interventions SDOH Screenings   Food Insecurity: No Food Insecurity (04/08/2024)  Housing: Low Risk (04/08/2024)  Transportation Needs: No Transportation Needs (04/08/2024)  Utilities: Not At Risk (04/08/2024)  Depression (PHQ2-9): High Risk (04/14/2023)  Financial Resource Strain: Low Risk (11/03/2021)  Physical Activity: Inactive (11/03/2021)  Social Connections: Socially Isolated (04/08/2024)  Stress: Stress Concern Present (11/03/2021)  Tobacco Use: High Risk (04/08/2024)    Readmission Risk Interventions     No data to display

## 2024-04-13 NOTE — Progress Notes (Signed)
 Occupational Therapy Treatment Patient Details Name: Timothy Wood MRN: [id number] DOB: [birth year] Today's Date: 04/13/2024   History of present illness 84 y/o male admitted on 04/08/24 with weakness. Yk:olwh Ca, back surgery, DVT, PE, COPD, anemia, lumbar compression fractures, falls   OT comments  Pt in recliner upon therapy arrival and agreeable to participate in OT treatment session focusing on BUE strength and endurance with use of yellow theraband. Pt provided with verbal instructions and visual demonstration for proper form and technique. Handout provided for reference. Pt demonstrated understanding. Discharge plan remains appropriate       If plan is discharge home, recommend the following:  A little help with walking and/or transfers;A lot of help with bathing/dressing/bathroom;Assist for transportation;Assistance with cooking/housework   Equipment Recommendations  BSC/3in1       Precautions / Restrictions Precautions Precautions: Fall Recall of Precautions/Restrictions: Intact Precaution/Restrictions Comments: watch BP Restrictions Weight Bearing Restrictions Per Provider Order: No       Mobility Bed Mobility     General bed mobility comments: up in recliner at start and end of session           ADL either performed or assessed with clinical judgement     Communication Communication Communication: Impaired Factors Affecting Communication: Hearing impaired   Cognition Arousal: Alert Behavior During Therapy: WFL for tasks assessed/performed Cognition: No apparent impairments  Following commands: Intact Following commands impaired: Only follows one step commands consistently      Cueing   Cueing Techniques: Verbal cues  Exercises General Exercises - Upper Extremity Shoulder ADduction: Strengthening, Both, 10 reps, Seated, Theraband Theraband Level (Shoulder Adduction): Level 1 (Yellow) Shoulder Horizontal ABduction: Strengthening, Both, 10 reps,  Seated, Theraband Theraband Level (Shoulder Horizontal Abduction): Level 1 (Yellow) Shoulder Exercises Shoulder External Rotation: Strengthening, Both, 10 reps, Seated, Theraband Theraband Level (Shoulder External Rotation): Level 1 (Yellow) Other Exercises Other Exercises: seated, PNF pattern D2, yellow band,  1x10, BUE            Pertinent Vitals/ Pain       Pain Assessment Pain Assessment: No/denies pain         Frequency  Min 2X/week        Progress Toward Goals  OT Goals(current goals can now be found in the care plan section)  Progress towards OT goals: Progressing toward goals      AM-PAC OT 6 Clicks Daily Activity     Outcome Measure   Help from another person eating meals?: None Help from another person taking care of personal grooming?: None Help from another person toileting, which includes using toliet, bedpan, or urinal?: A Lot Help from another person bathing (including washing, rinsing, drying)?: A Lot Help from another person to put on and taking off regular upper body clothing?: A Little Help from another person to put on and taking off regular lower body clothing?: A Little 6 Click Score: 18    End of Session    OT Visit Diagnosis: Muscle weakness (generalized) (M62.81)   Activity Tolerance Patient tolerated treatment well   Patient Left in chair;with call bell/phone within reach;with chair alarm set           Time: 8375-8356 OT Time Calculation (min): 19 min  Charges: OT General Charges $OT Visit: 1 Visit OT Treatments $Therapeutic Exercise: 8-22 mins  Leita Howell, OTR/L,CBIS  Supplemental OT - [facility] and [facility] Secure Chat Preferred    Darrill Vreeland, Leita BIRCH 04/13/2024, 5:19 PM

## 2024-04-13 NOTE — Telephone Encounter (Signed)
 Copied from CRM #[id number]. Topic: Clinical - Lab/Test Results >> Apr 13, 2024  8:30 AM [name] wrote: Reason for CRM: Patients daughter states this patient had to cancel his Ct follow up 2/2 because he is currently admitted to the hospital and will be being transferred to rehab immediatly  following his hospital release.   Daughter Rosaline [facility]) is rather requesting a call back with the patients results.  Rosaline lever [phone]  I called and spoke to Rosaline (dpr) Rosaline states pt is currently admitted and once he is discharged (possibly today or tomorrow) pt will go into rehab so Rosaline is not sure if they can have the CT and f/u scheduled. Rosaline also wants to know if pt needs to continue the antibiotics? Rosaline would also like Dr Neda to give a better description of the CT such as if it has grown or decreased in size.

## 2024-04-13 NOTE — Progress Notes (Signed)
 " PROGRESS NOTE    Timothy Wood  FMW:[id number] DOB: 02-19-1941 DOA: 04/08/2024 PCP: Jordan, [name], MD   Brief Narrative: This 84 yrs old Male with medical history significant for stage I lung adenocarcinoma, C, nodular dysplasia, osteoarthritis, blood clotting disorder, cataract, history of DVT, peripheral vascular disease, depression, diverticulosis, GERD,  hyperlipidemia,  hypertension, lumbar compression fracture, seizures while in ICU years ago without further episodes since then, sinusitis, pulmonary nodule, histoplasmosis with pneumonia, COPD, history of pneumonia who was recently admitted and discharged  3 days earlier this month due to lung abscess who is coming back to the emergency department due to generalized weakness, fatigue and dyspnea. He has been having significant difficulty just trying to get out of bed.  He was initially treated with Zosyn  and has been taking Augmentin  at home.   He has persistent leukocytosis and mildly elevated alkaline phosphatase at 168.  Chest x-ray with mild improvement in left lung airspace disease with persistent consolidation.  CT chest without contrast with increased airspace disease and consolidation in the left upper and lower lobes.  Small left pleural effusion and emphysema.  Zosyn  was resumed, cultures were sent.  Pulmonary was consulted.   1/25: Hemodynamically stable, small improvement in leukocytosis.  Patient has BAL on 03/24/2024 with no organisms.   1/26: Remained hemodynamically stable, improving leukocytosis and CRP at 8.2, A1c of 5.9.  1/27: Remained hemodynamically stable.  PT is recommending SNF-TOC working on placement.  Assessment & Plan:   Principal Problem:   Lung abscess (HCC) Active Problems:   COPD GOLD I   Hx of pulmonary emboli (HCC)   Essential hypertension   History of CAD (coronary artery disease)   Hypomagnesemia   Hyperlipemia, mixed   Depression, major, in partial remission   Pressure injury of skin   Tobacco  dependence due to cigarettes   Hyperglycemia  Lung abscess [facility]): He is clinically stable on baseline oxygen use of 3 L.   CT with concern of some increase in consolidation.  Improving leukocytosis .  Pulmonary was consulted -Continue with Zosyn - will continue Zosyn  while in the hospital and patient can be discharged on Augmentin  per pulmonary. -Continue with supportive care. -Patient will need a repeat CT chest in 3 weeks which was ordered by pulmonary. -Appreciate pulmonary recommendations.   COPD GOLD I: Patient was on 2 L off oxygen without any documented hypoxia. No wheezing noted on physical exam. Continue with  home bronchodilators. Wean Oxygen as tolerated.   Hx of pulmonary emboli (HCC): Continue with Eliquis .   Essential hypertension: Blood pressure currently within goal. Continue home amlodipine , irbesartan  and Coreg .   History of CAD (coronary artery disease) No chest pain or acute concern. Continue Eliquis , beta-blocker and statin.   Hypomagnesemia: - Continuing magnesium  supplementation.   Hyperlipemia, mixed: - Continue statin.   Depression, major, in partial remission: - Continuing duloxetine  and mirtazapine .   Pressure injury of skin: Patient with history of pressure injuries-present on admission. - Wound care consult.   Tobacco dependence due to cigarettes: - Nicotine  patch as needed.  counseling completed.   Hyperglycemia: Mild hyperglycemia noted yesterday, no history of diabetes.  Blood glucose normal this morning. - Hb A1C 5.9     DVT prophylaxis: Eliquis  Code Status: Full code Family Communication: No family at bed side Disposition Plan:     Status is: Inpatient Remains inpatient appropriate because: Patient admitted for lung abscess getting IV antibiotics.  PCCM is consulted.  Patient medically ready for discharge,  pending  SNF pending placement    Consultants:  Pulmonology  Procedures:   Antimicrobials:  Anti-infectives (From  admission, onward)    Start     Dose/Rate Route Frequency Ordered Stop   04/10/24 1030  amoxicillin -clavulanate (AUGMENTIN ) 875-125 MG per tablet 1 tablet        1 tablet Oral Every 12 hours 04/10/24 0944     04/08/24 2200  piperacillin -tazobactam (ZOSYN ) IVPB 3.375 g  Status:  Discontinued        3.375 g 12.5 mL/hr over 240 Minutes Intravenous Every 8 hours 04/08/24 1548 04/10/24 0944   04/08/24 1600  piperacillin -tazobactam (ZOSYN ) IVPB 3.375 g        3.375 g 100 mL/hr over 30 Minutes Intravenous  Once 04/08/24 1543 04/08/24 1752      Subjective: Patient was seen and examined at bedside.  Overnight events noted. Patient lying comfortably in bed,  denies any chest pain,  reports feeling better.  He remains on 2 L of supplemental oxygen.  Objective: Vitals:   04/12/24 2059 04/13/24 0443 04/13/24 0817 04/13/24 0940  BP: (!) 93/51 (!) 104/56 (!) 86/70 106/61  Pulse: 89 85 86 87  Resp: 18 18    Temp: 98.3 F (36.8 C) 98.4 F (36.9 C)  98 F (36.7 C)  TempSrc: Oral Oral    SpO2: 92% 91% 96% 94%  Weight:      Height:        Intake/Output Summary (Last 24 hours) at 04/13/2024 1134 Last data filed at 04/13/2024 0700 Gross per 24 hour  Intake 240 ml  Output 600 ml  Net -360 ml   Filed Weights   04/08/24 1048  Weight: 62 kg    Examination:  General exam: Appears calm and comfortable, not in any acute distress. Respiratory system: CTA Bilaterally. Respiratory effort normal.  RR 13 Cardiovascular system: S1 & S2 heard, RRR. No JVD, murmurs, rubs, gallops or clicks.  Gastrointestinal system: Abdomen is non distended, soft and non tender. Normal bowel sounds heard. Central nervous system: Alert and oriented x 3. No focal neurological deficits. Extremities: No edema, no cyanosis, no clubbing. Skin: No rashes, lesions or ulcers Psychiatry: Judgement and insight appear normal. Mood & affect appropriate.   Data Reviewed: I have personally reviewed following labs and imaging  studies  CBC: Recent Labs  Lab 04/08/24 1050 04/09/24 0631 04/10/24 0518 04/13/24 0556  WBC 20.7* 16.0* 14.7* 15.7*  NEUTROABS 18.2*  --   --   --   HGB 10.3* 10.6* 10.1* 10.6*  HCT 33.4* 36.0* 34.0* 34.9*  MCV 87.4 89.8 89.7 87.7  PLT 462* 428* 437* 454*   Basic Metabolic Panel: Recent Labs  Lab 04/08/24 1050 04/09/24 0631 04/13/24 0556  NA 134* 134* 133*  K 3.5 3.7 3.9  CL 98 99 96*  CO2 25 25 26   GLUCOSE 173* 89 120*  BUN 11 8 13   CREATININE 0.60* 0.63 0.71  CALCIUM  8.6* 8.6* 8.7*  MG  --   --  1.7  PHOS  --   --  2.4*   GFR: Estimated Creatinine Clearance: 61.4 mL/min (by C-G formula based on SCr of 0.71 mg/dL). Liver Function Tests: Recent Labs  Lab 04/08/24 1050 04/09/24 0631  AST 25 22  ALT 31 27  ALKPHOS 168* 157*  BILITOT 0.3 0.3  PROT 6.1* 6.1*  ALBUMIN 2.9* 2.8*   No results for input(s): LIPASE, AMYLASE in the last 168 hours. No results for input(s): AMMONIA in the last 168 hours. Coagulation Profile: No results  for input(s): INR, PROTIME in the last 168 hours. Cardiac Enzymes: No results for input(s): CKTOTAL, CKMB, CKMBINDEX, TROPONINI in the last 168 hours. BNP (last 3 results) No results for input(s): PROBNP in the last 8760 hours. HbA1C: No results for input(s): HGBA1C in the last 72 hours. CBG: Recent Labs  Lab 04/08/24 1048 04/11/24 0022  GLUCAP 171* 95   Lipid Profile: No results for input(s): CHOL, HDL, LDLCALC, TRIG, CHOLHDL, LDLDIRECT in the last 72 hours. Thyroid  Function Tests: No results for input(s): TSH, T4TOTAL, FREET4, T3FREE, THYROIDAB in the last 72 hours. Anemia Panel: No results for input(s): VITAMINB12, FOLATE, FERRITIN, TIBC, IRON, RETICCTPCT in the last 72 hours. Sepsis Labs: Recent Labs  Lab 04/08/24 1105  LATICACIDVEN 1.5    Recent Results (from the past 240 hours)  Culture, blood (routine x 2)     Status: None   Collection Time: 04/08/24 10:50  AM   Specimen: BLOOD RIGHT FOREARM  Result Value Ref Range Status   Specimen Description   Final    BLOOD RIGHT FOREARM Performed at [facility], [address] [place], [place] [address]    Special Requests   Final    BOTTLES DRAWN AEROBIC AND ANAEROBIC Blood Culture adequate volume Performed at [facility], [address], [place], [place] [address]    Culture   Final    NO GROWTH 5 DAYS Performed at [facility], [address] [place], [place] [address]    Report Status 04/13/2024 FINAL  Final  Culture, blood (routine x 2)     Status: None   Collection Time: 04/08/24 11:04 AM   Specimen: BLOOD  Result Value Ref Range Status   Specimen Description   Final    BLOOD RIGHT ANTECUBITAL Performed at [facility], [address], [place], [place] [address]    Special Requests   Final    BOTTLES DRAWN AEROBIC AND ANAEROBIC Blood Culture adequate volume Performed at [facility], [address], [place], [place] [address]    Culture   Final    NO GROWTH 5 DAYS Performed at [facility], [address] [place], [place] [address]    Report Status 04/13/2024 FINAL  Final    Radiology Studies: No results found.  Scheduled Meds:  amLODipine   5 mg Oral Daily   amoxicillin -clavulanate  1 tablet Oral Q12H   apixaban   5 mg Oral BID   carvedilol   6.25 mg Oral BID WC   collagenase   1 Application Topical Daily   DULoxetine   60 mg Oral Daily   feeding supplement  237 mL Oral BID BM   folic acid   1 mg Oral Daily   irbesartan   150 mg Oral Daily   liver oil-zinc  oxide  1 Application Topical BID   magnesium  oxide  400 mg Oral Daily   melatonin  3 mg Oral QHS   mirtazapine   7.5 mg Oral QHS   multivitamin  1 tablet Oral Daily   pantoprazole   40 mg Oral Daily   rosuvastatin   20 mg Oral Daily   thiamine   100 mg Oral Daily   Continuous Infusions:   LOS: 5 days    Time spent: 35 mins    Darcel Dawley, MD [facility]   If 7PM-7AM, please contact night-coverage  "

## 2024-04-13 NOTE — Patient Instructions (Signed)

## 2024-04-14 MED ORDER — AMOXICILLIN-POT CLAVULANATE 875-125 MG PO TABS: 1.0000 | ORAL_TABLET | Freq: Two times a day (BID) | ORAL | 0 refills | Status: AC

## 2024-04-14 MED ORDER — MIRTAZAPINE 7.5 MG PO TABS: 7.5000 mg | ORAL_TABLET | Freq: Every day | ORAL | 0 refills | Status: AC

## 2024-04-14 MED ORDER — COLLAGENASE 250 UNIT/GM EX OINT: 1.0000 | TOPICAL_OINTMENT | Freq: Every day | CUTANEOUS | 0 refills | Status: AC

## 2024-04-14 MED ORDER — ZINC OXIDE 40 % EX OINT: 1.0000 | TOPICAL_OINTMENT | Freq: Two times a day (BID) | CUTANEOUS | 0 refills | Status: AC

## 2024-04-14 NOTE — Progress Notes (Signed)
Report called to [facility] at [phone].

## 2024-04-14 NOTE — Plan of Care (Signed)

## 2024-04-14 NOTE — Discharge Summary (Signed)
 Physician Discharge Summary  Timothy Wood FMW:[id number] DOB: 19-Dec-1940 DOA: 04/08/2024  PCP: Jordan, [name], MD  Admit date: 04/08/2024  Discharge date: 04/14/2024  Admitted From: Home  Disposition:  Skilled Nursing Home  Recommendations for Outpatient Follow-up:  Follow up with PCP in 1-2 weeks. Please obtain BMP/CBC in one week. Advised to continue Remeron  7.5 mg daily for increased appetite. Advised to continue current medications.  Home Health: None Equipment/Devices: None  Discharge Condition: Stable CODE STATUS: DNR Diet recommendation: Heart Healthy   Brief Summary / Hospital Course: This 84 yrs old Male with medical history significant for stage I lung adenocarcinoma, C, nodular dysplasia, osteoarthritis, blood clotting disorder, cataract, history of DVT, peripheral vascular disease, depression, diverticulosis, GERD,  hyperlipidemia,  hypertension, lumbar compression fracture, seizures while in ICU years ago without further episodes since then, sinusitis, pulmonary nodule, histoplasmosis with pneumonia, COPD, history of pneumonia who was recently admitted and discharged  3 days earlier this month due to lung abscess who is coming back to the emergency department due to generalized weakness, fatigue and dyspnea. He has been having significant difficulty just trying to get out of bed.  He was initially treated with Zosyn  and has been taking Augmentin  at home.   He has persistent leukocytosis and mildly elevated alkaline phosphatase at 168.  Chest x-ray with mild improvement in left lung airspace disease with persistent consolidation.  CT chest without contrast with increased airspace disease and consolidation in the left upper and lower lobes.  Small left pleural effusion and emphysema.  Zosyn  was resumed, cultures were sent.  Pulmonary was consulted. 1/25: Hemodynamically stable, small improvement in leukocytosis.  Patient has BAL on 03/24/2024 with no organisms.   1/26:  Remained hemodynamically stable, improving leukocytosis and CRP at 8.2, A1c of 5.9.  1/27: Remained hemodynamically stable.  PT is recommending SNF-TOC working on placement. 1/29: Patient remains hemodynamically stable, weaned down to 2L/min. Insurance authorization approved.  Discharge Diagnoses:  Principal Problem:   Lung abscess (HCC) Active Problems:   COPD GOLD I   Hx of pulmonary emboli (HCC)   Essential hypertension   History of CAD (coronary artery disease)   Hypomagnesemia   Hyperlipemia, mixed   Depression, major, in partial remission   Pressure injury of skin   Tobacco dependence due to cigarettes   Hyperglycemia  Lung abscess [facility]): He is clinically stable on baseline oxygen use of 2-3 L.   CT with concern of some increase in consolidation.  Improving leukocytosis .  Pulmonary was consulted -Continue with Zosyn - will continue Zosyn  while in the hospital and patient can be discharged on Augmentin  per pulmonary. -Continue with supportive care. -Patient will need a repeat CT chest in 3 weeks which was ordered by pulmonary. -Appreciate pulmonary recommendations.   COPD GOLD I: Patient was on 2 L off oxygen without any documented hypoxia. No wheezing noted on physical exam. Continue with  home bronchodilators. Wean Oxygen as tolerated.   Hx of pulmonary emboli (HCC): Continue with Eliquis .   Essential hypertension: Blood pressure currently within goal. Continue home amlodipine , irbesartan  and Coreg .   History of CAD (coronary artery disease) No chest pain or acute concern. Continue Eliquis , beta-blocker and statin.   Hypomagnesemia: - Continuing magnesium  supplementation.   Hyperlipemia, mixed: - Continue statin.   Depression, major, in partial remission: - Continuing duloxetine  and mirtazapine .   Pressure injury of skin: Patient with history of pressure injuries-present on admission. - Wound care consult.   Tobacco dependence due to cigarettes: -  Nicotine  patch as needed.  counseling completed.   Hyperglycemia: Mild hyperglycemia noted yesterday, no history of diabetes.  Blood glucose normal this morning. - Hb A1C 5.9  Discharge Instructions  Discharge Instructions     Call MD for:  difficulty breathing, headache or visual disturbances   Complete by: As directed    Call MD for:  persistant dizziness or light-headedness   Complete by: As directed    Call MD for:  persistant nausea and vomiting   Complete by: As directed    Diet general   Complete by: As directed    Discharge instructions   Complete by: As directed    Advised to follow-up with primary care physician in 1 week. Advised to continue Remeron  7.5 mg for increased appetite. Advised to continue current medications.   Discharge wound care:   Complete by: As directed    Follow-up wound care at his skilled nursing facility.   Increase activity slowly   Complete by: As directed       Allergies as of 04/14/2024       Reactions   Codeine Anaphylaxis   Hydrocodone  Itching        Medication List     TAKE these medications    Acetaminophen  500 MG capsule Take 1,000 mg by mouth daily as needed for fever.   amLODipine  5 MG tablet Commonly known as: NORVASC  Take 1 tablet (5 mg total) by mouth daily.   amoxicillin -clavulanate 875-125 MG tablet Commonly known as: AUGMENTIN  Take 1 tablet by mouth every 12 (twelve) hours for 10 days. What changed: when to take this   apixaban  5 MG Tabs tablet Commonly known as: Eliquis  Take 1 tablet (5 mg total) by mouth 2 (two) times daily.   carvedilol  6.25 MG tablet Commonly known as: COREG  Take 1 tablet (6.25 mg total) by mouth 2 (two) times daily with a meal.   collagenase  250 UNIT/GM ointment Commonly known as: SANTYL  Apply 1 Application topically daily. Start taking on: April 15, 2024   DULoxetine  60 MG capsule Commonly known as: Cymbalta  Take 1 capsule (60 mg total) by mouth daily.   EPINEPHrine  0.3  mg/0.3 mL Soaj injection Commonly known as: EpiPen  2-Pak To use daily if needed for severe allergy reaction. What changed:  how much to take how to take this when to take this reasons to take this   folic acid  1 MG tablet Commonly known as: FOLVITE  TAKE 1 TABLET BY MOUTH DAILY   irbesartan  150 MG tablet Commonly known as: AVAPRO  Take 1 tablet (150 mg total) by mouth daily.   liver oil-zinc  oxide 40 % ointment Commonly known as: DESITIN Apply 1 Application topically 2 (two) times daily.   magnesium  oxide 400 MG tablet Commonly known as: MAG-OX Take 1 tablet (400 mg total) by mouth daily.   mirtazapine  7.5 MG tablet Commonly known as: REMERON  Take 1 tablet (7.5 mg total) by mouth at bedtime.   omeprazole  20 MG capsule Commonly known as: PRILOSEC TAKE 1 CAPSULE BY MOUTH TWICE A DAY BEFORE MEALS What changed:  how much to take how to take this when to take this   PreserVision AREDS 2 Caps Take 1 capsule by mouth in the morning and at bedtime.   rosuvastatin  20 MG tablet Commonly known as: CRESTOR  Take 1 tablet (20 mg total) by mouth daily.   thiamine  100 MG tablet Commonly known as: VITAMIN B1 Take 1 tablet (100 mg total) by mouth daily.  Discharge Care Instructions  (From admission, onward)           Start     Ordered   04/14/24 0000  Discharge wound care:       Comments: Follow-up wound care at his skilled nursing facility.   04/14/24 1113            Follow-up Information     Jordan, [name], MD Follow up in 1 week(s).   Specialty: Family Medicine Contact information: [address] [place] [address] [phone]                Allergies[1]  Consultations: Infectious Diseases. Pulmonology   Procedures/Studies: CT CHEST WO CONTRAST Result Date: 04/08/2024 CLINICAL DATA:  Lung abscess. Increasing shortness of breath and productive cough. EXAM: CT CHEST WITHOUT CONTRAST TECHNIQUE: Multidetector CT  imaging of the chest was performed following the standard protocol without IV contrast. RADIATION DOSE REDUCTION: This exam was performed according to the departmental dose-optimization program which includes automated exposure control, adjustment of the mA and/or kV according to patient size and/or use of iterative reconstruction technique. COMPARISON:  03/23/2024. FINDINGS: Cardiovascular: The heart is normal in size and there is a trace pericardial effusion. Scattered coronary artery calcifications are noted. There is atherosclerotic calcification of the aorta without evidence of aneurysm. Pulmonary trunk is normal in caliber. Mediastinum/Nodes: Enlarged lymph nodes are present in the mediastinum measuring up to 1.4 cm. No axillary lymphadenopathy. Evaluation of the hila is limited due to lack of IV contrast. The thyroid  gland, trachea, and esophagus are within normal limits. Lungs/Pleura: Centrilobular emphysematous changes are present in the lungs. Bronchiectasis with bronchial wall thickening is noted bilaterally. Debris is present in the lower lobe bronchi. Surgical changes are noted in the right lung. Increased patchy airspace disease and consolidation is noted in the left upper and lower lobes. There is a small left pleural effusion. The previously seen abscess is not well delineated due to lack of IV contrast. No pneumothorax is seen. Upper Abdomen: Stable cysts are present in the left lobe of the liver. Cysts are noted in the kidneys bilaterally. A moderate amount of retained stool is noted in the colon. A stable nodule is present in the left adrenal gland measuring 7 mm. Musculoskeletal: Degenerative changes are present in the thoracic spine. Stable compression deformities are noted in the upper lumbar spine. No acute osseous abnormality. IMPRESSION: 1. Increasing airspace disease and consolidation in the left upper and lower lobes. Abscess is not well delineated on noncontrast exam. 2. Small left  pleural effusion. 3. Emphysema. 4. Coronary artery calcifications. 5. Aortic atherosclerosis. Electronically Signed   By: Leita Birmingham M.D.   On: 04/08/2024 16:41   DG Chest Portable 1 View Result Date: 04/08/2024 CLINICAL DATA:  Shortness of breath.  Productive cough.  Pneumonia. EXAM: PORTABLE CHEST 1 VIEW COMPARISON:  03/24/2024, and CT on 03/23/2024 FINDINGS: Heart size is normal. Mild improvement in the left lung airspace disease is seen, with persistent dense consolidation in the central left upper lobe. Centrally obstructing left hilar mass or lymphadenopathy cannot be excluded. Right lung is clear. No evidence of pleural effusion. IMPRESSION: Mild improvement in left lung airspace disease, with persistent dense consolidation in the central left upper lobe. Centrally obstructing left hilar mass or lymphadenopathy cannot be excluded. Electronically Signed   By: Norleen DELENA Kil M.D.   On: 04/08/2024 12:05   DG CHEST PORT 1 VIEW Result Date: 03/24/2024 EXAM: 1 VIEW(S) XRAY OF THE CHEST 03/24/2024 05:20:00 PM  COMPARISON: 03/23/2024 CLINICAL HISTORY: Status post bronchoscopy FINDINGS: LUNGS AND PLEURA: Surgical sutures noted in right lung. Worsening left lung airspace disease with progressive consolidation within the left upper lobe. Small left pleural effusion. No pneumothorax. HEART AND MEDIASTINUM: Aortic atherosclerosis. No acute abnormality of the cardiac and mediastinal silhouettes. BONES AND SOFT TISSUES: No acute osseous abnormality. IMPRESSION: 1. Worsening left lung airspace disease with progressive consolidation in the left upper lobe and small left pleural effusion. Electronically signed by: Dorethia Molt MD MD 03/24/2024 05:51 PM EST RP Workstation: [id number]   DG C-ARM BRONCHOSCOPY Result Date: 03/24/2024 C-ARM BRONCHOSCOPY: Fluoroscopy was utilized by the requesting physician.  No radiographic interpretation.   CT CHEST ABDOMEN PELVIS W CONTRAST Result Date: 03/23/2024 EXAM: CT CHEST,  ABDOMEN AND PELVIS WITH CONTRAST 03/23/2024 11:47:03 AM TECHNIQUE: CT of the chest, abdomen and pelvis was performed with the administration of 100 mL of iohexol  (OMNIPAQUE ) 300 MG/ML solution. Multiplanar reformatted images are provided for review. Automated exposure control, iterative reconstruction, and/or weight based adjustment of the mA/kV was utilized to reduce the radiation dose to as low as reasonably achievable. COMPARISON: 04/22/2023, 06/03/2022, 08/06/2015 CLINICAL HISTORY: Painful and odorous urination, intermittent flank pain. FINDINGS: CHEST: MEDIASTINUM AND LYMPH NODES: Heart and pericardium are unremarkable. The central airways are clear. Mildly enlarged multistation mediastinal lymph nodes. For example, a 1.1 cm AP window lymph node (axial 27), an enlarged subcarinal lymph node measuring 1.5 cm (axial 34), and an enlarged left suprahilar lymph node measuring 1 cm (axial 32). LUNGS AND PLEURA: Centrilobular emphysema. Postsurgical changes of a wedge resection along the right middle lobe laterally. Surgical anastomosis posteriorly in the right upper lobe consistent with resection. Mild diffuse bronchial wall thickening. Hazy ground glass airspace opacities dependently within the left upper lobe and the left lower lobe. There is a large thick-walled fluid and gas collection centered in the posterior left upper lobe measuring 7.2 x 8.1 x 9.6 cm. Small left pleural effusion. A few clustered tree-in-bud nodules are present in the right upper lobe, worrisome for endobronchial spread of infection. No pneumothorax. No pulmonary embolism. The posterior left apical segmental pulmonary artery courses through the medial aspect of the thick-walled left upper lobe fluid collection. ABDOMEN AND PELVIS: LIVER: Similar appearance of multiple left hepatic lobe cysts. GALLBLADDER AND BILE DUCTS: Distended gallbladder without wall thickening or radiopaque stones. No biliary ductal dilatation. SPLEEN: No acute  abnormality. PANCREAS: No acute abnormality. ADRENAL GLANDS: Small left adrenal nodule measuring 7 mm, which in retrospect, was likely present previously, but much smaller suggesting a benign lesion such as an adenoma. KIDNEYS, URETERS AND BLADDER: Multiple bilateral renal cysts. No stones in the kidneys or ureters. No hydronephrosis. No perinephric or periureteral stranding. The urinary bladder is distended without focal abnormality. GI AND BOWEL: Stomach demonstrates no acute abnormality. Decompressed normal appendix. There is no bowel obstruction. REPRODUCTIVE ORGANS: Mild prostatomegaly. Findings of BPH also present. PERITONEUM AND RETROPERITONEUM: No ascites. No free air. VASCULATURE: Diffuse calcified aortic atherosclerosis. No aneurysm or aortic dissection. Severe diffuse aortoiliac atherosclerosis. Infrarenal aortic stent appears patent. Left common iliac stent also appears patent. ABDOMINAL AND PELVIS LYMPH NODES: No lymphadenopathy. BONES AND SOFT TISSUES: Osteopenia. Multilevel degenerative disc disease throughout the spine. Chronic appearing compression fractures within the lumbar spine. Unchanged lesion in the right iliac bone, likely benign. No focal soft tissue abnormality. IMPRESSION: 1. Findings consistent with multifocal pneumonia in the left upper and left lower lobes. Large thick-walled fluid and gas collection in the posterior left upper lobe, measuring 7.2  x 8.1 x 9.6 cm, consistent with a large lung abscess. Of note, the posterior left apical segmental pulmonary artery courses through the medial aspect of the lung abscess. 2. Small left pleural effusion. Right upper lobe tree-in-bud nodules are present, consistent with endobronchial spread of the infection. 3. Mildly enlarged multistation mediastinal lymph nodes, likely reactive. 4. No pulmonary embolism. 5. No acute abnormality in the abdomen or pelvis. 6. Mild prostatomegaly with findings of BPH. Prominent Electronically signed by: Rogelia Myers MD MD 03/23/2024 12:13 PM EST RP Workstation: [id number]   DG Chest 2 View Result Date: 03/23/2024 CLINICAL DATA:  Cough and shortness of breath. EXAM: CHEST - 2 VIEW COMPARISON:  04/22/2023 FINDINGS: Lungs are adequately inflated demonstrate moderate focal consolidation over the left upper lobe likely pneumonia. No evidence of effusion. Postsurgical change over the right lung. Cardiomediastinal silhouette and remainder of the exam is unchanged. IMPRESSION: Moderate focal consolidation over the left upper lobe likely pneumonia. Recommend follow-up chest radiograph 4-6 weeks to document resolution. Electronically Signed   By: Toribio Agreste M.D.   On: 03/23/2024 11:07    Subjective: Patient was seen and examined at bedside.  Overnight events noted. Patient reports feeling much improved and he wants to be discharged to skilled nursing facility today.  Discharge Exam: Vitals:   04/14/24 0810 04/14/24 0816  BP: 116/71   Pulse: 71   Resp:    Temp:    SpO2: 96% 94%   Vitals:   04/13/24 2352 04/14/24 0428 04/14/24 0810 04/14/24 0816  BP: (!) 108/55 107/60 116/71   Pulse: 88 75 71   Resp:  18    Temp:  98 F (36.7 C)    TempSrc:  Oral    SpO2:  98% 96% 94%  Weight:      Height:        General: Pt is alert, awake, not in acute distress Cardiovascular: RRR, S1/S2 +, no rubs, no gallops Respiratory: CTA bilaterally, no wheezing, no rhonchi Abdominal: Soft, NT, ND, bowel sounds + Extremities: no edema, no cyanosis    The results of significant diagnostics from this hospitalization (including imaging, microbiology, ancillary and laboratory) are listed below for reference.     Microbiology: Recent Results (from the past 240 hours)  Culture, blood (routine x 2)     Status: None   Collection Time: 04/08/24 10:50 AM   Specimen: BLOOD RIGHT FOREARM  Result Value Ref Range Status   Specimen Description   Final    BLOOD RIGHT FOREARM Performed at [facility], [address] [place], [place] [address]    Special Requests   Final    BOTTLES DRAWN AEROBIC AND ANAEROBIC Blood Culture adequate volume Performed at [facility], [address] [place], [place] [address]    Culture   Final    NO GROWTH 5 DAYS Performed at [facility], [address], [place], [place] [address]    Report Status 04/13/2024 FINAL  Final  Culture, blood (routine x 2)     Status: None   Collection Time: 04/08/24 11:04 AM   Specimen: BLOOD  Result Value Ref Range Status   Specimen Description   Final    BLOOD RIGHT ANTECUBITAL Performed at [facility], [address] [place], [place] [address]    Special Requests   Final    BOTTLES DRAWN AEROBIC AND ANAEROBIC Blood Culture adequate volume Performed at [facility], [address] [place], [place]  [address]    Culture   Final    NO GROWTH 5 DAYS Performed at [facility], [address], [place], [place] [address]    Report Status 04/13/2024 FINAL  Final     Labs: BNP (last 3 results) No results for input(s): BNP in the last 8760 hours. Basic Metabolic Panel: Recent Labs  Lab 04/08/24 1050 04/09/24 0631 04/13/24 0556  NA 134* 134* 133*  K 3.5 3.7 3.9  CL 98 99 96*  CO2 25 25 26   GLUCOSE 173* 89 120*  BUN 11 8 13   CREATININE 0.60* 0.63 0.71  CALCIUM  8.6* 8.6* 8.7*  MG  --   --  1.7  PHOS  --   --  2.4*   Liver Function Tests: Recent Labs  Lab 04/08/24 1050 04/09/24 0631  AST 25 22  ALT 31 27  ALKPHOS 168* 157*  BILITOT 0.3 0.3  PROT 6.1* 6.1*  ALBUMIN 2.9* 2.8*   No results for input(s): LIPASE, AMYLASE in the last 168 hours. No results for input(s): AMMONIA in the last 168 hours. CBC: Recent Labs  Lab 04/08/24 1050 04/09/24 0631 04/10/24 0518 04/13/24 0556  WBC 20.7* 16.0* 14.7* 15.7*  NEUTROABS 18.2*  --   --   --   HGB 10.3* 10.6* 10.1* 10.6*  HCT 33.4* 36.0* 34.0* 34.9*  MCV 87.4 89.8 89.7 87.7  PLT 462*  428* 437* 454*   Cardiac Enzymes: No results for input(s): CKTOTAL, CKMB, CKMBINDEX, TROPONINI in the last 168 hours. BNP: Invalid input(s): POCBNP CBG: Recent Labs  Lab 04/08/24 1048 04/11/24 0022  GLUCAP 171* 95   D-Dimer No results for input(s): DDIMER in the last 72 hours. Hgb A1c No results for input(s): HGBA1C in the last 72 hours. Lipid Profile No results for input(s): CHOL, HDL, LDLCALC, TRIG, CHOLHDL, LDLDIRECT in the last 72 hours. Thyroid  function studies No results for input(s): TSH, T4TOTAL, T3FREE, THYROIDAB in the last 72 hours.  Invalid input(s): FREET3 Anemia work up No results for input(s): VITAMINB12, FOLATE, FERRITIN, TIBC, IRON, RETICCTPCT in the last 72 hours. Urinalysis    Component Value Date/Time   COLORURINE YELLOW 03/23/2024 1231   APPEARANCEUR CLEAR 03/23/2024 1231   LABSPEC >1.046 (H) 03/23/2024 1231   PHURINE 5.0 03/23/2024 1231   GLUCOSEU NEGATIVE 03/23/2024 1231   GLUCOSEU NEGATIVE 04/14/2023 1329   HGBUR NEGATIVE 03/23/2024 1231   BILIRUBINUR NEGATIVE 03/23/2024 1231   BILIRUBINUR n 07/25/2012 1037   KETONESUR NEGATIVE 03/23/2024 1231   PROTEINUR NEGATIVE 03/23/2024 1231   UROBILINOGEN 0.2 04/14/2023 1329   NITRITE NEGATIVE 03/23/2024 1231   LEUKOCYTESUR NEGATIVE 03/23/2024 1231   Sepsis Labs Recent Labs  Lab 04/08/24 1050 04/09/24 0631 04/10/24 0518 04/13/24 0556  WBC 20.7* 16.0* 14.7* 15.7*   Microbiology Recent Results (from the past 240 hours)  Culture, blood (routine x 2)     Status: None   Collection Time: 04/08/24 10:50 AM   Specimen: BLOOD RIGHT FOREARM  Result Value Ref Range Status   Specimen Description   Final    BLOOD RIGHT FOREARM Performed at [facility], [address], [place], [place] [address]    Special Requests   Final    BOTTLES DRAWN AEROBIC AND ANAEROBIC Blood Culture adequate volume Performed at [facility], [address], [place], [place] [address]    Culture   Final    NO GROWTH 5 DAYS Performed at [facility], [address], [place], [place] [address]  Report Status 04/13/2024 FINAL  Final  Culture, blood (routine x 2)     Status: None   Collection Time: 04/08/24 11:04 AM   Specimen: BLOOD  Result Value Ref Range Status   Specimen Description   Final    BLOOD RIGHT ANTECUBITAL Performed at [facility], [address], [place], [place] [address]    Special Requests   Final    BOTTLES DRAWN AEROBIC AND ANAEROBIC Blood Culture adequate volume Performed at [facility] [address], [place], [place] [address]    Culture   Final    NO GROWTH 5 DAYS Performed at [facility], [address] [place], [place] [address]    Report Status 04/13/2024 FINAL  Final     Time coordinating discharge: Over 30 minutes  SIGNED:   Darcel Dawley, MD  [facility] 04/14/2024, 11:50 AM Pager   If 7PM-7AM, please contact night-coverage     [1]  Allergies Allergen Reactions   Codeine Anaphylaxis   Hydrocodone  Itching

## 2024-04-14 NOTE — TOC Progression Note (Addendum)
 Transition of Care [facility]) - Progression Note    Patient Details  Name: Timothy Wood MRN: [id number] Date of Birth: [birth year]  [facility] CM/SW Contact  Toy LITTIE Agar, RN Phone Number:[phone]  04/14/2024, 11:20 AM  Clinical Narrative:    Insurance shara has been approved -Plan shara ID [id number]   Auth ID [id number] 04/13/2024-04/17/2024.   1300 CM has confirmed that patient will discharge to [facility]   Expected Discharge Plan: Skilled Nursing Facility Barriers to Discharge: SNF Pending bed offer, Continued Medical Work up               Expected Discharge Plan and Services In-house Referral: NA Discharge Planning Services: CM Consult Post Acute Care Choice: Skilled Nursing Facility Living arrangements for the past 2 months: Single Family Home Expected Discharge Date: 04/14/24               DME Arranged: N/A DME Agency: NA       HH Arranged: NA HH Agency: NA         Social Drivers of Health (SDOH) Interventions SDOH Screenings   Food Insecurity: No Food Insecurity (04/08/2024)  Housing: Low Risk (04/08/2024)  Transportation Needs: No Transportation Needs (04/08/2024)  Utilities: Not At Risk (04/08/2024)  Depression (PHQ2-9): High Risk (04/14/2023)  Financial Resource Strain: Low Risk (11/03/2021)  Physical Activity: Inactive (11/03/2021)  Social Connections: Socially Isolated (04/08/2024)  Stress: Stress Concern Present (11/03/2021)  Tobacco Use: High Risk (04/08/2024)    Readmission Risk Interventions     No data to display

## 2024-04-14 NOTE — Discharge Instructions (Signed)
 Advised to continue Remeron  7.5 mg for increased appetite. Advised to continue current medications.

## 2024-04-14 NOTE — Plan of Care (Signed)
   Problem: Activity: Goal: Risk for activity intolerance will decrease Outcome: Progressing   Problem: Nutrition: Goal: Adequate nutrition will be maintained Outcome: Progressing   Problem: Pain Managment: Goal: General experience of comfort will improve and/or be controlled Outcome: Progressing   Problem: Safety: Goal: Ability to remain free from injury will improve Outcome: Progressing

## 2024-04-17 ENCOUNTER — Inpatient Hospital Stay: Payer: PRIVATE HEALTH INSURANCE

## 2024-04-18 ENCOUNTER — Telehealth (HOSPITAL_COMMUNITY): Payer: Self-pay

## 2024-04-18 ENCOUNTER — Other Ambulatory Visit (HOSPITAL_COMMUNITY): Payer: Self-pay

## 2024-04-18 NOTE — Telephone Encounter (Signed)
 Patient's daughter, Rosaline, is waiting for a call back from Dr. Neda regarding patient's 2nd CT scan.  Want's a comparison between the scans and to check to see if he needs to continue taking antibiotics.  He is currently in a rehab center.  She can reached at [phone].  Thanks.

## 2024-04-18 NOTE — Telephone Encounter (Signed)
 Pharmacy Patient Advocate Encounter  Received notification from [employer] that Prior Authorization for Santyl  250UNIT/GM ointment has been APPROVED from 03/16/24 to 03/15/25   PA #/Case ID/Reference #: [id number]

## 2024-04-19 ENCOUNTER — Telehealth: Payer: Self-pay

## 2024-04-19 NOTE — Telephone Encounter (Signed)
 Copied from CRM #[id number]. Topic: Clinical - Lab/Test Results >> Apr 13, 2024  8:30 AM [name] wrote: Reason for CRM: Patients daughter states this patient had to cancel his Ct follow up 2/2 because he is currently admitted to the hospital and will be being transferred to rehab immediatly  following his hospital release.   Daughter [facility]) is rather requesting a call back with the patients results.  Rosaline lever [phone] >> Apr 19, 2024  8:49 AM Russell PARAS wrote: Pt's daughter Rosaline is contacting clinic due to missed call. Reviewed chart and Ashlyn was last to make contact with daughter.   Requested call back  CB#  [phone]    Already been addressed see phone note 04/13/24

## 2024-04-20 NOTE — Telephone Encounter (Signed)
Routing to Dr. Olalere.
